# Patient Record
Sex: Male | Born: 1935
Health system: Southern US, Community
[De-identification: ages and names within clinical notes are randomized; demographics above are authoritative.]

## PROBLEM LIST (undated history)

## (undated) DIAGNOSIS — J189 Pneumonia, unspecified organism: Secondary | ICD-10-CM

## (undated) DIAGNOSIS — E8809 Other disorders of plasma-protein metabolism, not elsewhere classified: Secondary | ICD-10-CM

## (undated) DIAGNOSIS — I10 Essential (primary) hypertension: Secondary | ICD-10-CM

## (undated) DIAGNOSIS — R809 Proteinuria, unspecified: Secondary | ICD-10-CM

## (undated) DIAGNOSIS — R270 Ataxia, unspecified: Secondary | ICD-10-CM

## (undated) DIAGNOSIS — N184 Chronic kidney disease, stage 4 (severe): Secondary | ICD-10-CM

## (undated) DIAGNOSIS — I5032 Chronic diastolic (congestive) heart failure: Secondary | ICD-10-CM

## (undated) DIAGNOSIS — M1990 Unspecified osteoarthritis, unspecified site: Secondary | ICD-10-CM

## (undated) DIAGNOSIS — E119 Type 2 diabetes mellitus without complications: Secondary | ICD-10-CM

## (undated) DIAGNOSIS — E785 Hyperlipidemia, unspecified: Secondary | ICD-10-CM

## (undated) DIAGNOSIS — I739 Peripheral vascular disease, unspecified: Secondary | ICD-10-CM

## (undated) DIAGNOSIS — N186 End stage renal disease: Secondary | ICD-10-CM

## (undated) DIAGNOSIS — R609 Edema, unspecified: Secondary | ICD-10-CM

## (undated) DIAGNOSIS — R42 Dizziness and giddiness: Secondary | ICD-10-CM

## (undated) DIAGNOSIS — I2510 Atherosclerotic heart disease of native coronary artery without angina pectoris: Secondary | ICD-10-CM

## (undated) DIAGNOSIS — C61 Malignant neoplasm of prostate: Secondary | ICD-10-CM

## (undated) HISTORY — PX: CARDIAC CATHETERIZATION: SHX172

## (undated) HISTORY — DX: Dizziness and giddiness: R42

## (undated) HISTORY — PX: COLONOSCOPY: SHX174

## (undated) HISTORY — DX: Atherosclerotic heart disease of native coronary artery without angina pectoris: I25.10

## (undated) HISTORY — PX: INSERTION PROSTATE RADIATION SEED: SUR718

## (undated) HISTORY — DX: Ataxia, unspecified: R27.0

## (undated) HISTORY — DX: Peripheral vascular disease, unspecified: I73.9

## (undated) HISTORY — DX: Hyperlipidemia, unspecified: E78.5

## (undated) HISTORY — DX: Essential (primary) hypertension: I10

## (undated) HISTORY — PX: CATARACT EXTRACTION W/ INTRAOCULAR LENS  IMPLANT, BILATERAL: SHX1307

---

## 1985-03-06 HISTORY — PX: CORONARY ARTERY BYPASS GRAFT: SHX141

## 1998-01-26 ENCOUNTER — Encounter: Admission: RE | Admit: 1998-01-26 | Discharge: 1998-02-17 | Payer: Self-pay | Admitting: Anesthesiology

## 1998-07-27 ENCOUNTER — Encounter: Payer: Self-pay | Admitting: Emergency Medicine

## 1998-07-27 ENCOUNTER — Emergency Department (HOSPITAL_COMMUNITY): Admission: EM | Admit: 1998-07-27 | Discharge: 1998-07-27 | Payer: Self-pay | Admitting: Emergency Medicine

## 1999-01-25 ENCOUNTER — Encounter: Payer: Self-pay | Admitting: Family Medicine

## 1999-01-25 ENCOUNTER — Encounter: Admission: RE | Admit: 1999-01-25 | Discharge: 1999-01-25 | Payer: Self-pay | Admitting: Family Medicine

## 2000-03-27 ENCOUNTER — Encounter: Admission: RE | Admit: 2000-03-27 | Discharge: 2000-04-23 | Payer: Self-pay | Admitting: Orthopedic Surgery

## 2000-06-26 ENCOUNTER — Encounter: Admission: RE | Admit: 2000-06-26 | Discharge: 2000-06-28 | Payer: Self-pay | Admitting: Orthopedic Surgery

## 2001-10-18 ENCOUNTER — Encounter: Admission: RE | Admit: 2001-10-18 | Discharge: 2001-10-18 | Payer: Self-pay | Admitting: Urology

## 2001-10-18 ENCOUNTER — Encounter: Payer: Self-pay | Admitting: Urology

## 2001-10-21 ENCOUNTER — Encounter (INDEPENDENT_AMBULATORY_CARE_PROVIDER_SITE_OTHER): Payer: Self-pay | Admitting: Specialist

## 2001-10-21 ENCOUNTER — Ambulatory Visit (HOSPITAL_BASED_OUTPATIENT_CLINIC_OR_DEPARTMENT_OTHER): Admission: RE | Admit: 2001-10-21 | Discharge: 2001-10-21 | Payer: Self-pay | Admitting: Urology

## 2002-11-27 ENCOUNTER — Encounter: Admission: RE | Admit: 2002-11-27 | Discharge: 2002-11-27 | Payer: Self-pay | Admitting: Urology

## 2002-11-27 ENCOUNTER — Encounter: Payer: Self-pay | Admitting: Urology

## 2002-12-01 ENCOUNTER — Ambulatory Visit (HOSPITAL_BASED_OUTPATIENT_CLINIC_OR_DEPARTMENT_OTHER): Admission: RE | Admit: 2002-12-01 | Discharge: 2002-12-01 | Payer: Self-pay | Admitting: Urology

## 2002-12-01 ENCOUNTER — Ambulatory Visit (HOSPITAL_COMMUNITY): Admission: RE | Admit: 2002-12-01 | Discharge: 2002-12-01 | Payer: Self-pay | Admitting: Urology

## 2002-12-01 ENCOUNTER — Encounter (INDEPENDENT_AMBULATORY_CARE_PROVIDER_SITE_OTHER): Payer: Self-pay

## 2003-08-10 ENCOUNTER — Ambulatory Visit (HOSPITAL_COMMUNITY): Admission: RE | Admit: 2003-08-10 | Discharge: 2003-08-10 | Payer: Self-pay | Admitting: Cardiology

## 2004-12-20 ENCOUNTER — Encounter: Admission: RE | Admit: 2004-12-20 | Discharge: 2004-12-20 | Payer: Self-pay | Admitting: Endocrinology

## 2006-08-13 ENCOUNTER — Encounter: Admission: RE | Admit: 2006-08-13 | Discharge: 2006-08-13 | Payer: Self-pay | Admitting: Endocrinology

## 2006-09-07 ENCOUNTER — Encounter (INDEPENDENT_AMBULATORY_CARE_PROVIDER_SITE_OTHER): Payer: Self-pay | Admitting: *Deleted

## 2006-09-07 ENCOUNTER — Ambulatory Visit (HOSPITAL_COMMUNITY): Admission: RE | Admit: 2006-09-07 | Discharge: 2006-09-07 | Payer: Self-pay | Admitting: *Deleted

## 2006-09-12 HISTORY — PX: NM MYOCAR PERF WALL MOTION: HXRAD629

## 2007-04-03 ENCOUNTER — Encounter: Admission: RE | Admit: 2007-04-03 | Discharge: 2007-04-03 | Payer: Self-pay | Admitting: Urology

## 2007-04-05 ENCOUNTER — Encounter (INDEPENDENT_AMBULATORY_CARE_PROVIDER_SITE_OTHER): Payer: Self-pay | Admitting: Urology

## 2007-04-05 ENCOUNTER — Ambulatory Visit (HOSPITAL_BASED_OUTPATIENT_CLINIC_OR_DEPARTMENT_OTHER): Admission: RE | Admit: 2007-04-05 | Discharge: 2007-04-05 | Payer: Self-pay | Admitting: Urology

## 2008-08-25 ENCOUNTER — Ambulatory Visit (HOSPITAL_COMMUNITY): Admission: RE | Admit: 2008-08-25 | Discharge: 2008-08-25 | Payer: Self-pay | Admitting: *Deleted

## 2008-08-25 ENCOUNTER — Encounter (INDEPENDENT_AMBULATORY_CARE_PROVIDER_SITE_OTHER): Payer: Self-pay | Admitting: *Deleted

## 2008-11-04 HISTORY — PX: US ECHOCARDIOGRAPHY: HXRAD669

## 2009-03-05 ENCOUNTER — Ambulatory Visit (HOSPITAL_BASED_OUTPATIENT_CLINIC_OR_DEPARTMENT_OTHER): Admission: RE | Admit: 2009-03-05 | Discharge: 2009-03-05 | Payer: Self-pay | Admitting: Urology

## 2009-03-19 ENCOUNTER — Ambulatory Visit: Admission: RE | Admit: 2009-03-19 | Discharge: 2009-06-17 | Payer: Self-pay | Admitting: Radiation Oncology

## 2009-05-07 ENCOUNTER — Ambulatory Visit (HOSPITAL_BASED_OUTPATIENT_CLINIC_OR_DEPARTMENT_OTHER): Admission: RE | Admit: 2009-05-07 | Discharge: 2009-05-07 | Payer: Self-pay | Admitting: Urology

## 2010-05-14 LAB — POCT I-STAT 4, (NA,K, GLUC, HGB,HCT)
Glucose, Bld: 138 mg/dL — ABNORMAL HIGH (ref 70–99)
HCT: 48 % (ref 39.0–52.0)
Hemoglobin: 16.3 g/dL (ref 13.0–17.0)
Potassium: 3.8 mEq/L (ref 3.5–5.1)
Sodium: 138 mEq/L (ref 135–145)

## 2010-05-14 LAB — GLUCOSE, CAPILLARY: Glucose-Capillary: 126 mg/dL — ABNORMAL HIGH (ref 70–99)

## 2010-05-20 LAB — COMPREHENSIVE METABOLIC PANEL
ALT: 24 U/L (ref 0–53)
AST: 33 U/L (ref 0–37)
Albumin: 3.7 g/dL (ref 3.5–5.2)
Alkaline Phosphatase: 42 U/L (ref 39–117)
BUN: 14 mg/dL (ref 6–23)
CO2: 28 mEq/L (ref 19–32)
Calcium: 8.8 mg/dL (ref 8.4–10.5)
Chloride: 105 mEq/L (ref 96–112)
Creatinine, Ser: 1.53 mg/dL — ABNORMAL HIGH (ref 0.4–1.5)
GFR calc Af Amer: 54 mL/min — ABNORMAL LOW (ref >60)
GFR calc non Af Amer: 45 mL/min — ABNORMAL LOW (ref >60)
Glucose, Bld: 144 mg/dL — ABNORMAL HIGH (ref 70–99)
Potassium: 3.9 mEq/L (ref 3.5–5.1)
Sodium: 139 mEq/L (ref 135–145)
Total Bilirubin: 0.7 mg/dL (ref 0.3–1.2)
Total Protein: 6 g/dL (ref 6.0–8.3)

## 2010-05-20 LAB — APTT: aPTT: 26 seconds (ref 24–37)

## 2010-05-20 LAB — CBC
HCT: 43.3 % (ref 39.0–52.0)
Hemoglobin: 14.6 g/dL (ref 13.0–17.0)
MCHC: 33.7 g/dL (ref 30.0–36.0)
MCV: 95.4 fL (ref 78.0–100.0)
Platelets: 137 10*3/uL — ABNORMAL LOW (ref 150–400)
RBC: 4.54 MIL/uL (ref 4.22–5.81)
RDW: 12.9 % (ref 11.5–15.5)
WBC: 5.7 10*3/uL (ref 4.0–10.5)

## 2010-05-20 LAB — GLUCOSE, CAPILLARY
Glucose-Capillary: 131 mg/dL — ABNORMAL HIGH (ref 70–99)
Glucose-Capillary: 144 mg/dL — ABNORMAL HIGH (ref 70–99)

## 2010-05-20 LAB — PROTIME-INR
INR: 0.99 (ref 0.00–1.49)
Prothrombin Time: 13 seconds (ref 11.6–15.2)

## 2010-06-06 LAB — GLUCOSE, CAPILLARY: Glucose-Capillary: 163 mg/dL — ABNORMAL HIGH (ref 70–99)

## 2010-07-12 NOTE — Op Note (Signed)
NAMEKEITH, STARN               ACCOUNT NO.:  [id number]   MEDICAL RECORD NO.:  [id number]          PATIENT TYPE:  AMB   LOCATION:  ENDO                         FACILITY:  [facility]   PHYSICIAN:  Waverly Ferrari, M.D.    DATE OF BIRTH:  [birth year]   DATE OF PROCEDURE:  09/07/2006  DATE OF DISCHARGE:                               OPERATIVE REPORT   PROCEDURE:  Colonoscopy.   INDICATIONS:  Colon polyps, colon cancer screening.   ANESTHESIA:  Fentanyl 75 mcg, Versed 8.5 mg.   PROCEDURE IN DETAIL:  With the patient mildly sedated in the left  lateral decubitus position, a rectal examination was attempted.  Subsequently, the Pentax videoscopic colonoscope was inserted in the  rectum and passed under direct vision with pressure applied to reach the  cecum identified by the ileocecal valve and appendiceal orifice, both of  which were photographed. From this point, the colonoscope was slowly  withdrawn taking circumferential views of the colonic mucosa as we  withdrew to the rectum stopping at one fold removed from the ileocecal  valve at which point a small polyp was seen and it was noted by the fact  there was of a different color than the background melanosis coli that  was extensive. This polyp was removed using hot biopsy forceps  technique, setting of 20/150 blended current.  We next stopped at the  splenic flexure where a second polyp was seen, about the same size  approximately 4-5 mm, and it too was removed using hot biopsy forceps  technique at the same setting.  There was good hemostasis with both  removals. The colonoscope was then withdrawn all the way to the rectum  which appeared normal other than changes of melanosis coli direct view  and on retroflexed view appeared unremarkable.  The endoscope was  straightened and withdrawn.  The patient's vital signs and pulse  oximeter remained stable.  The patient tolerated the procedure well  without apparent complications.   FINDINGS:  Diffuse melanosis coli, two polyps, one at the cecum and one  at the splenic flexure area.   PLAN:  Await biopsy report.  The patient will call me for results and  follow-up with me as needed as an outpatient.           ______________________________  Waverly Ferrari, M.D.     GMO/MEDQ  D:  09/07/2006  T:  09/08/2006  Job:  [id number]

## 2010-07-12 NOTE — Op Note (Signed)
[name], [name]               ACCOUNT NO.:  [id number]   MEDICAL RECORD NO.:  [id number]          PATIENT TYPE:  AMB   LOCATION:  NESC                         FACILITY:  [facility]   PHYSICIAN:  Mark C. Karsten Ro, M.D.  DATE OF BIRTH:  1934/01/23   DATE OF PROCEDURE:  04/05/2007  DATE OF DISCHARGE:                               OPERATIVE REPORT   PREOPERATIVE DIAGNOSIS:  1. Elevated prostate specific antigen.  2. Rectal stricture.   POSTOPERATIVE DIAGNOSIS:  1. Elevated prostate specific antigen.  2. Rectal stricture.   PROCEDURE:  1. Examination under anesthesia.  2. Transrectal ultrasound and biopsy of the prostate.   SURGEON:  Mark C. Karsten Ro, M.D.   ANESTHESIA:  MAC.   SPECIMENS:  Prostate core biopsies to pathology.   BLOOD LOSS:  None.   COMPLICATIONS:  None.   INDICATIONS:  The patient is a 77 year old black male who was seen  initially for an elevated PSA of 5.69 back in September of 2008.  He has  a history of elevated PSA in the past and a prior transrectal ultrasound  and biopsy which revealed BPH only.  His most recent PSA was found to be  7.13 with a free to total ratio of 6.3%.  I have discussed with the  patient the need for transrectal ultrasound and biopsy of his prostate.  However, he has a severe rectal stricture and this causes him severe  discomfort even with rectal examination.  He therefore has elected to  proceed with transrectal ultrasound and biopsy of his prostate under  anesthesia.  I went over the risks, complications and alternatives.  He  understands and elects to proceed.   DESCRIPTION OF OPERATION:  After informed consent, the patient was  brought to the major OR and placed in the lateral decubitus position and  administered intravenous sedation under monitored anesthetic care.  I  then performed a digital rectal examination and noted the rectal  stricture although it did allow my index finger easy access into the  rectum and the  prostate was palpated.  It was noted to be 1+ enlarged  but smooth and symmetric and noted to be free of any nodularity,  fluctuance or masses.   The 7 MHz transrectal ultrasound probe was then introduced into the  rectum and real time ultrasonography of the prostate was performed.  Scanning initially from base to apex in the axial plane noted the  seminal vesicles appear symmetric and uninvolved with disease process.  The prostate itself appeared to have an intact capsule throughout.  There was central calcification in the transition zone on both sides  seen mainly in the base and on the right extending into the mid prostate  region.  There were no hypoechoic lesions noted and sagittal scanning  revealed no additional findings.   Transrectal ultrasound guidance was then used to perform sextant  biopsies.  Two cores were obtained from the right and left posterior,  mid and apical regions of the prostate for a total of 12 cores.  I then  removed the transrectal ultrasound probe and noted no bleeding.  The  patient was awakened and taken to the recovery room in stable  satisfactory condition.  He tolerated the procedure well with no  intraoperative complications.  He did receive preoperative oral  antibiotics with Cipro 500 mg as well as 80 mg of gentamicin and 1.5  grams of Unasyn prior to the procedure.   I will contact the patient with the results of his biopsy and plan to  see him back in 6 months for followup DRE and PSA as long as the biopsy  is negative.      Mark C. Karsten Ro, M.D.  Electronically Signed     MCO/MEDQ  D:  04/05/2007  T:  04/06/2007  Job:  [id number]

## 2010-07-12 NOTE — Op Note (Signed)
NAMERUSS, GASTER               ACCOUNT NO.:  [id number]   MEDICAL RECORD NO.:  [id number]          PATIENT TYPE:  AMB   LOCATION:  ENDO                         FACILITY:  [facility]   PHYSICIAN:  Waverly Ferrari, M.D.    DATE OF BIRTH:  09-29-1933   DATE OF PROCEDURE:  DATE OF DISCHARGE:                               OPERATIVE REPORT   PROCEDURE:  Colonoscopy with polypectomy and biopsy.   INDICATIONS:  Colon polyps.   ANESTHESIA:  Fentanyl 100 mcg and Versed 7 mg.   PROCEDURE:  With the patient mildly sedated in the left lateral  decubitus position, the Pentax videoscopic colonoscope was inserted in  the rectum and passed under direct vision to the cecum identified by  crow's foot of the cecum and ileocecal valve, both of which were  photographed.  Of note, the patient had extensive melanosis coli  throughout the colon.  From this point, the colonoscope was slowly  withdrawn, taking circumferential views of the colonic mucosa and  stopping in the splenic flexure and descending colon area where three  polyps were seen, two were photographed, but all three were removed.  On  the first, the snare went through without cautery, except for cutting,  but bleeding stopped without any further manipulations.  The second  polyp was removed using hot biopsy forceps technique and the third with  snare cautery technique with setting of 20/150 blended current.  All  tissue was retrieved for pathology.  From this point, the colonoscope  was then further withdrawn all the way to the rectum which appeared  normal on direct and retroflexed view.  The endoscope was straightened  and withdrawn.  The patient's vital signs and pulse oximeter remained  stable.  The patient tolerated the procedure well without apparent  complication.   FINDINGS:  Extensive melanosis coli with polyps in the splenic flexure  area and descending colon as noted.   PLAN:  Await biopsy report.  The patient will call me for  results and  followup with me as needed as an outpatient.           ______________________________  Waverly Ferrari, M.D.     GMO/MEDQ  D:  08/25/2008  T:  08/25/2008  Job:  [id number]

## 2010-07-15 NOTE — Op Note (Signed)
[name], [name]                           ACCOUNT NO.:  [id number]   MEDICAL RECORD NO.:  [id number]                   PATIENT TYPE:  AMB   LOCATION:  NESC                                 FACILITY:  [facility]   PHYSICIAN:  Mark C. Karsten Ro, M.D.               DATE OF BIRTH:  12-16-1933   DATE OF PROCEDURE:  12/01/2002  DATE OF DISCHARGE:                                 OPERATIVE REPORT   PREOPERATIVE DIAGNOSES:  1. Elevated PSA.  2. Benign prostatic hypertrophy.  3. Rectal stricture.   POSTOPERATIVE DIAGNOSES:  1. Elevated PSA.  2. Benign prostatic hypertrophy.  3. Rectal stricture.  4. Prostatic calcification.  5. Intraprostatic cyst.   PROCEDURES:  Examination under anesthesia with transrectal ultrasound and  guided biopsy of the prostate.   SURGEON:  Mark C. Karsten Ro, M.D.   ANESTHESIA:  General.   BLOOD LOSS:  Less than 1 mL.   SPECIMENS:  Prostate biopsy cores, six from right lobe and six from the left  lobe.   DRAINS:  None.   COMPLICATIONS:  None.   INDICATIONS:  The patient is a 77 year old black male with known rectal  stricture.  He has had a PSA elevation of 4.4 a year ago.  At that time, a  biopsy was performed that revealed BPH only.  In June 2004, his PSA was  4.86, but his ratio was 6%, and now his PSA has gone to 5.1.  He is unable  to tolerate a rectal examination and/or the transrectal ultrasound and  biopsy of his prostate without anesthesia.  We therefore have discussed  performing this under anesthesia, and he understands the risks,  complications, alternatives, and limitations.   DESCRIPTION OF OPERATION:  After informed consent, the patient brought to  the major OR, placed on the table, administered general anesthesia, then  moved in a lateral position.  Transrectal ultrasound probe was then placed  in the rectum.  The 7 MHz probe was utilized to visualize the prostate in  first the axial plane and then sagittal plane.  I note scanning from  base to  apex in the axial plane, the seminal vesicals are symmetric and uninvolved  in the disease process, maintaining a good prostatoseminal vesical angle.  I  again see hypertrophy at the transition zone, consistent with BPH as well as  some calcification within the prostate.  In the apex, over on the left-hand  side is a small anechoic area consistent with an intraprostatic cyst of no  significance.  The capsule appears intact throughout.  The prostate volume  measured 25.3 mL.  No additional findings were noted sagittally.   Examination under anesthesia was performed.  I note a mild to moderate  rectal stricture.  No rectal masses were noted.  The prostate is noted to be  1+ enlarged and smooth.  It did seem just slightly firmer over on the right-  hand  side relative to the left but otherwise, no suspicious nodularity or  induration or seminal vesical abnormality was palpable.   Utilizing the 7 MHz transrectal ultrasound probe, transrectal ultrasound  guided biopsies were taken from the prostate.  Six cores were obtained from  each lobe of the prostate without immediate complication.  No significant  bleeding was noted upon removal of the transrectal ultrasound probe.  The  patient was therefore awakened and taken to the recovery room in stable and  satisfactory condition.  He tolerated the procedure well.  He had stopped  his aspirin.  He had undergone routine enema and antibiotic prep.  He will  remain on antibiotics for 48 hours further and will be contacted with his  biopsy results.                                               Mark C. Karsten Ro, M.D.    MCO/MEDQ  D:  12/01/2002  T:  12/01/2002  Job:  [id number]   cc:   Ronaldo Miyamoto, M.D.  [address]  [place] [address]  Fax: [phone]   Osvaldo Human, M.D.  [address]  [place] [address]  Fax: [phone]

## 2010-07-15 NOTE — Consult Note (Signed)
[facility]  Patient:    [name], [name]                        MRN: [id number] Proc. Date: 03/27/00 Adm. Date:  [id number] Attending:  Newt Minion Dictator:   Hyman Bible, M.D.                          Consultation Report  REFERRING PHYSICIAN:  Dr. Lindwood Qua.  HISTORY OF PRESENT ILLNESS:  The patient is a 77 year old black male with a history of diabetes and hypertension who was referred for routine diabetic foot care and evaluation of tingling and numbness in his left foot greater than his right foot.  Patient states he has had the pain in his left foot since his CABG in ______.  He does have a history of diabetic neuropathy. The pain does not limit his activities; in fact, he walks several miles every day without any difficulty.  He denies any history of neuropathic ulcers or foot trauma.  PAST MEDICAL HISTORY:  Diabetes, hypertension, hypercholesterolemia, coronary artery disease, status post CABG in ______.  PAST SURGICAL HISTORY:  CABG, as mentioned above, and right knee arthroplasty in 1994.  FAMILY HISTORY:  Noncontributory.  ALLERGIES:  No known drug allergies.  MEDICATIONS 1. Prinivil 5 mg p.o. q.d. 2. Avandia 4 mg p.o. q.d. 3. Glucotrol XL 10 mg p.o. q.d. 4. Darvocet-N 100 one tab p.o. q.6h. p.r.n. 5. Enteric-coated aspirin one tab p.o. q.d. 6. Pravachol 40 mg p.o. q.d. 7. Maxzide one tab p.o. q.d. 8. Multivitamin.  PHYSICAL EXAMINATION  BILATERAL FEET:  Vascular:  DP pulses were trace bilaterally.  Skin:  He had no edema, no rashes, no ulcerations, bilaterally.  He did have a callus on the left fifth digit and first metatarsal head that were pared and had no underlying ulceration.  He had decreased hair growth on bilateral lower extremities.  NEUROLOGIC:  Sensation to 10 g monofilament was decreased bilaterally.  Muscle strength in dorsiflexion and plantarflexion was 5/5, bilaterally.  ASSESSMENT 1. Diabetic and  peripheral neuropathy. 2. Calluses, bilateral feet -- pared. 3. Hypertrophic toenails bilaterally -- trimmed.  PLAN 1. Fit for custom shoes and inserts. 2. Diabetic foot teaching -- patient should inspect feet every day. 3. Bag Balm to bilateral feet every day. 4. Return to clinic in three months. DD:  03/28/00 TD:  03/28/00 Job: [id number]

## 2010-07-15 NOTE — Op Note (Signed)
TNAMERODRIGO, PEZZULLO                          ACCOUNT NO.:  [id number]   MEDICAL RECORD NO.:  [id number]                   PATIENT TYPE:  AMB   LOCATION:  NESC                                 FACILITY:  [facility]   PHYSICIAN:  Mark C. Karsten Ro, M.D.               DATE OF BIRTH:  [birth year]   DATE OF PROCEDURE:  10/21/2001  DATE OF DISCHARGE:                                 OPERATIVE REPORT   PREOPERATIVE DIAGNOSIS:  Elevated PSA with low free-to-total ratio.   POSTOPERATIVE DIAGNOSES:  1. Elevated PSA with low free-to-total ratio.  2. Benign prostatic hypertrophy.  3. Prostate calcification.   PROCEDURE:  Exam under anesthesia and transrectal ultrasound with biopsy of  the prostate.   SURGEON:  Mark C. Karsten Ro, M.D.   ANESTHESIA:  General.   ESTIMATED BLOOD LOSS:  Less than 5 cc.   DRAINS:  None.   SPECIMENS:  Five cores from each both right and left lobes of the prostate  to pathology.   COMPLICATIONS:  None.   INDICATIONS:  The patient is a 77 year old black male, who was seen for a  PSA that had risen from 1.9 in 2000 to 2.4 in 2001, then to 3.6 in 2002, and  finally this month has risen to 4.4 with a free-to-total ratio of only 2%.  The patient told me that he would not be able to perform a rectal  examination on him and attempting to do so in the office confirmed that to  be the case.  He had a mild rectal stricture and was extremely sensitive and  would not tolerate that in the office.  He therefore was brought to the OR  for this procedure.   DESCRIPTION OF OPERATION:  After informed consent, the patient brought to  the major OR, placed on table, administered general anesthesia.  He had  undergone enema and antibiotic prep and once fully anesthetized, was moved  onto his left side with an axillary roll in place.  I then was able to  perform a digital rectal exam, finding slight prostate enlargement but no  fixation of the gland, firm nodularity, raised areas,  induration, or seminal  vesical abnormality.  No rectal masses were noted.   I then used the transrectal ultrasound.  A 7 MHz probe was used, and  scanning was performed in both the sagittal and axial planes, scanning from  base to apex.  First in the axial plane, I note seminal vesicals appear  symmetric and uninvolved with disease process.  The base of the prostate  revealed some hypertrophy at the transition zone with some calcification of  the border between transition and peripheral zones.  This is seen to extend  through the mid prostate.  The calcification was then noted to taper off in  the apex.  There were no hyper or hypoechoic peripheral zone lesions  otherwise noted.  The capsule appeared intact throughout.  I then scanned  sagittally and noted no additional findings.  Prostate volume was measured,  and it is on the hard copy of the ultrasound.   I then used the ultrasound to perform ultrasound-guided biopsies of the  prostate, using a 14 gauge Biopty needle.  Five cores were obtained from  each lobe of the prostate with no immediate complication noted.  The  ultrasound probe was then removed, and the patient was awakened and taken to  recovery room in stable and satisfactory condition.  He tolerated the  procedure well with no intraoperative complications.  He will be given a  prescription for #6 Vicodin for pain and #6 Cipro 500 mg to be taken b.i.d.  I will contact him with the result of the biopsy and otherwise plan to see  him back in four months for recheck if that is negative.                                               Mark C. Karsten Ro, M.D.    MCO/MEDQ  D:  10/21/2001  T:  10/22/2001  Job:  [id number]

## 2010-07-15 NOTE — Cardiovascular Report (Signed)
NAMECOSIMO, DOCTOR NO.:  [id number]   MEDICAL RECORD NO.:  [id number]                   PATIENT TYPE:  OIB   LOCATION:  2856                                 FACILITY:  [facility]   PHYSICIAN:  Bryson Dames, M.D.             DATE OF BIRTH:  September 21, 1933   DATE OF PROCEDURE:  08/10/2003  DATE OF DISCHARGE:  08/10/2003                              CARDIAC CATHETERIZATION   PROCEDURES PERFORMED:  1. Selective coronary angiography by Judkins technique.  2. Retrograde left heart catheterization.  3. Left ventricular angiography.  4. Selective visualization of the left internal mammary artery graft.  5. Selective visualization of a sequential saphenous vein graft to obtuse     marginal branch of circumflex and diagonal branch of left anterior     descending.   ENTRY SITE:  Right femoral.   DYE USED:  Omnipaque.   PATIENT PROFILE:  The patient is a 77 year old African-American diabetic  gentleman who underwent coronary artery bypass grafting in 1987 by Dr.  __________.  He has recently been having symptoms of shortness of breath and  chest pressure.  Since his grafts are now 75 years old, we opted to defer on  a Cardiolite stress test and proceed with outpatient cardiac  catheterization.  We continued his usual medications prior to entry.   RESULTS:  Left ventricular pressure is 0000000, end diastolic pressure 7,  central aortic pressure 145/65, mean of 100.  No aortic valve gradient by  pullback technique.   ANGIOGRAPHIC RESULTS:  The left anterior descending coronary artery is 80%  stenosed in the proximal portion of the vessel.  The distal vessel receives  antegrade flow from the native LAD and flow from a patent internal mammary.  The distal LAD is normal.   The diagonal branch of the LAD arises beyond the 80% stenosis in the native  LAD and the saphenous vein graft to that diagonal is patent.  See  description below.  The circumflex coronary  artery contains an 80% proximal  stenosis and an 80% mid stenosis and the distal vessel appears free of any  significant high grade stenoses and is supplied antegrade by a patent vein  graft.   The right coronary artery contains proximal calcifications over a segment of  about 10-15 mm in length, but no more than 30% obstructive lesion seen here.  This is a dominant vessel that is normal below the proximal area of luminal  irregularity and calcification.   The saphenous vein graft, which is sequential to OM and to diagonal is  widely patent and the distal runoff in both of those respective vessels is  well preserved.   Internal mammary artery graft to mid LAD is widely patent throughout and  there is good caliber in the distal vessel and TIMI-3 flow.   Left ventricle shows good contractility with normal ejection fraction and no  evidence of mitral regurgitation.  FINAL DIAGNOSES:  1. Status post coronary artery bypass graft 1987.  2. Chest pressure and shortness of breath.  3. Native coronary artery disease of left anterior descending, circumflex     and trivial disease in the right.  4. Patent left internal mammary artery graft to left anterior descending and     patent sequential vein graft to obtuse marginal-1 and circumflex obtuse     marginal branch.  5. Normal left ventricular systolic function.   PLAN:  Reassurance of patient and if pain continues consider other sources  of pain.                                               Bryson Dames, M.D.    WHG/MEDQ  D:  08/10/2003  T:  08/11/2003  Job:  [id number]   cc:   Osvaldo Human, M.D.  [address] [address]  Fax: [phone]   Bryson Dames, M.D.  [phone] [address]  [place]  [place] [address]  Fax: Price

## 2010-08-21 ENCOUNTER — Emergency Department (HOSPITAL_COMMUNITY)
Admission: EM | Admit: 2010-08-21 | Discharge: 2010-08-21 | Disposition: A | Discharge status: Home / Self Care | Payer: 59 | Attending: Emergency Medicine | Admitting: Emergency Medicine

## 2010-08-21 DIAGNOSIS — E7800 Pure hypercholesterolemia, unspecified: Secondary | ICD-10-CM | POA: Insufficient documentation

## 2010-08-21 DIAGNOSIS — E119 Type 2 diabetes mellitus without complications: Secondary | ICD-10-CM | POA: Insufficient documentation

## 2010-08-21 DIAGNOSIS — C61 Malignant neoplasm of prostate: Secondary | ICD-10-CM | POA: Insufficient documentation

## 2010-08-21 DIAGNOSIS — Y93H9 Activity, other involving exterior property and land maintenance, building and construction: Secondary | ICD-10-CM | POA: Insufficient documentation

## 2010-08-21 DIAGNOSIS — T63461A Toxic effect of venom of wasps, accidental (unintentional), initial encounter: Secondary | ICD-10-CM | POA: Insufficient documentation

## 2010-08-21 DIAGNOSIS — I10 Essential (primary) hypertension: Secondary | ICD-10-CM | POA: Insufficient documentation

## 2010-08-21 DIAGNOSIS — T6391XA Toxic effect of contact with unspecified venomous animal, accidental (unintentional), initial encounter: Secondary | ICD-10-CM | POA: Insufficient documentation

## 2010-11-18 LAB — BASIC METABOLIC PANEL
BUN: 17
CO2: 26
Calcium: 9
Chloride: 107
Creatinine, Ser: 1.25
GFR calc Af Amer: >60
GFR calc non Af Amer: 57 — ABNORMAL LOW
Glucose, Bld: 124 — ABNORMAL HIGH
Potassium: 4.3
Sodium: 141

## 2010-11-18 LAB — POCT HEMOGLOBIN-HEMACUE
Hemoglobin: 14.3
Operator id: 114531

## 2012-08-22 ENCOUNTER — Encounter: Payer: Self-pay | Admitting: Internal Medicine

## 2012-08-22 ENCOUNTER — Ambulatory Visit (INDEPENDENT_AMBULATORY_CARE_PROVIDER_SITE_OTHER): Payer: 59 | Admitting: Internal Medicine

## 2012-08-22 VITALS — BP 130/64 | HR 59 | Ht 72.0 in | Wt 201.9 lb

## 2012-08-22 DIAGNOSIS — Z951 Presence of aortocoronary bypass graft: Secondary | ICD-10-CM | POA: Insufficient documentation

## 2012-08-22 DIAGNOSIS — I2510 Atherosclerotic heart disease of native coronary artery without angina pectoris: Secondary | ICD-10-CM

## 2012-08-22 DIAGNOSIS — R42 Dizziness and giddiness: Secondary | ICD-10-CM

## 2012-08-22 DIAGNOSIS — E119 Type 2 diabetes mellitus without complications: Secondary | ICD-10-CM | POA: Insufficient documentation

## 2012-08-22 DIAGNOSIS — R079 Chest pain, unspecified: Secondary | ICD-10-CM

## 2012-08-22 DIAGNOSIS — I739 Peripheral vascular disease, unspecified: Secondary | ICD-10-CM | POA: Insufficient documentation

## 2012-08-22 DIAGNOSIS — I10 Essential (primary) hypertension: Secondary | ICD-10-CM | POA: Insufficient documentation

## 2012-08-22 DIAGNOSIS — E785 Hyperlipidemia, unspecified: Secondary | ICD-10-CM | POA: Insufficient documentation

## 2012-08-22 DIAGNOSIS — I25810 Atherosclerosis of coronary artery bypass graft(s) without angina pectoris: Secondary | ICD-10-CM | POA: Insufficient documentation

## 2012-08-22 NOTE — Progress Notes (Signed)
OFFICE NOTE  Chief Complaint:  Dizziness, shortness of breath and pressure with exertion  Primary Care Physician: Nathaniel Bolt, MD  HPI:  [name] is a 79 year old with a history of coronary artery disease status post CABG in 1987, hypertension, dyslipidemia, diabetes, and peripheral arterial disease which is mild. Over the past year he has done well from a cardiac standpoint. He does have a history of prostate cancer and underwent radiation seed implant for that. He has also been having problems with confusion and brain fog and his medications were adjusted, including holding his statin, which did not cause much improvement. This may be some early dementia. In addition, he has abnormal lower extremity Dopplers, which I repeated. This has actually shown mild improvement in his ABIs with ABI 1.1 on the right and ABI of 0.92 on the left. The bilateral posterior tibials were occluded and there was 50% to 69% reduction in left common iliac and about a 0 to 49% reduction in the right superficial femoral. Despite this, he has no symptoms of claudication. He also denies any chest pain. He has had some shortness of breath and this, however, seems to be related to this episode over the past several months where he reported initially he was thought stung by a bee, developed swelling on the left side of his face and then subsequently developed extreme weakness and there as concern of a rheumatoid arthritis. His symptoms have improved with a steroid burst and tapered. He is currently on prednisone as well as methotrexate. He has also had recent dizziness. He reports head and sinus congestion, which may be contributing to his symptoms. However some of the dizziness is associated with walking up a hill at the end of exercise or walking down hills particularly. Is not associated with change in position or quick head movements.  There is some associated fatigue and chest fullness with exercise and improves  with rest, reminiscent of possible angina.  PMHx:  Past Medical History  Diagnosis Date  . Coronary artery disease   . Hypertension   . Dyslipidemia   . DM (diabetes mellitus)   . Peripheral artery disease     mild    Past Surgical History  Procedure Laterality Date  . Cabg  1987    FAMHx:  No family history on file.  SOCHx:   reports that he quit smoking about 40 years ago. His smoking use included Cigarettes. He smoked 0.00 packs per day. He has never used smokeless tobacco. He reports that  drinks alcohol. He reports that he does not use illicit drugs.  ALLERGIES:  No Known Allergies  ROS: A comprehensive review of systems was negative except for: Respiratory: positive for dyspnea on exertion Cardiovascular: positive for exertional chest pressure/discomfort Neurological: positive for dizziness  HOME MEDS: Current Outpatient Prescriptions  Medication Sig Dispense Refill  . amLODipine (NORVASC) 5 MG tablet Take 5 mg by mouth daily.      Marland Kitchen atorvastatin (LIPITOR) 40 MG tablet Take 40 mg by mouth daily.      . folic acid (FOLVITE) 1 MG tablet Take 1 mg by mouth daily.      Marland Kitchen glimepiride (AMARYL) 4 MG tablet Take 4 mg by mouth as needed.      Marland Kitchen lisinopril (PRINIVIL,ZESTRIL) 40 MG tablet Take 40 mg by mouth daily.      . metFORMIN (GLUCOPHAGE) 500 MG tablet Take 1,000 mg by mouth 2 (two) times daily with a meal.      .  tamsulosin (FLOMAX) 0.4 MG CAPS Take 1 capsule by mouth daily.      Marland Kitchen torsemide (DEMADEX) 100 MG tablet Take 1 tablet by mouth as needed.       No current facility-administered medications for this visit.    LABS/IMAGING: No results found for this or any previous visit (from the past 48 hour(s)). No results found.  VITALS: BP 130/64  Pulse 59  Ht 6' (1.829 m)  Wt 201 lb 14.4 oz (91.581 kg)  BMI 27.38 kg/m2  EXAM: General appearance: alert and no distress Neck: no adenopathy, no carotid bruit, no JVD, supple, symmetrical, trachea midline and  thyroid not enlarged, symmetric, no tenderness/mass/nodules Lungs: clear to auscultation bilaterally Heart: regular rate and rhythm, S1, S2 normal, no murmur, click, rub or gallop Abdomen: soft, non-tender; bowel sounds normal; no masses,  no organomegaly Extremities: extremities normal, atraumatic, no cyanosis or edema Pulses: 2+ and symmetric Skin: Skin color, texture, turgor normal. No rashes or lesions Neurologic: Grossly normal  EKG: Sinus bradycardia at 59  ASSESSMENT: 1. Coronary artery disease status post CABG in 1987 2. Hypertension 3. Hyperlipidemia 4. Diabetes type 2 5. Mild PAD 6. Possible rheumatoid arthritis 7. Dizziness 8. Dyspnea exertion with chest pressure  PLAN: 1.   [name] is having some recent dizziness and feeling off fog around his head. At times he feels his heart beat is really strong and has some pinching sensation in his chest. Some of the symptoms seem to be associated with exertion, like walking up hills. I wonder if this is associated with ischemia, despite the fact that he is not describing clear angina. He does have some chest fullness and mild shortness of breath, which could be related to angina. Since his grafts are very old, would recommend a nuclear stress test which we discussed at our last visit 2 years ago. In addition, he has mild PAD. There are no carotid bruits, however I would like to rule out obstructive carotid disease. Will go ahead and obtain Dopplers as well.  Pixie Casino, MD, [facility] Attending Cardiologist [facility] [place] [facility] 08/22/2012, 2:12 PM

## 2012-08-22 NOTE — Patient Instructions (Signed)
Your physician has requested that you have a carotid duplex. This test is an ultrasound of the carotid arteries in your neck. It looks at blood flow through these arteries that supply the brain with blood. Allow one hour for this exam. There are no restrictions or special instructions.  Your physician has requested that you have en exercise stress myoview. For further information please visit [email or web address]. Please follow instruction sheet, as given.  Your physician recommends that you schedule a follow-up appointment in: 2-3 weeks after your tests.

## 2012-08-23 ENCOUNTER — Encounter: Payer: Self-pay | Admitting: Internal Medicine

## 2012-08-29 ENCOUNTER — Encounter (HOSPITAL_COMMUNITY): Payer: 59

## 2012-09-03 ENCOUNTER — Ambulatory Visit (HOSPITAL_COMMUNITY)
Admission: RE | Admit: 2012-09-03 | Discharge: 2012-09-03 | Disposition: A | Discharge status: Home / Self Care | Payer: Medicare Other | Source: Ambulatory Visit | Attending: Cardiovascular Disease | Admitting: Cardiovascular Disease

## 2012-09-03 ENCOUNTER — Ambulatory Visit (HOSPITAL_BASED_OUTPATIENT_CLINIC_OR_DEPARTMENT_OTHER)
Admission: RE | Admit: 2012-09-03 | Discharge: 2012-09-03 | Disposition: A | Discharge status: Home / Self Care | Payer: Medicare Other | Source: Ambulatory Visit | Attending: Cardiovascular Disease | Admitting: Cardiovascular Disease

## 2012-09-03 DIAGNOSIS — E119 Type 2 diabetes mellitus without complications: Secondary | ICD-10-CM | POA: Insufficient documentation

## 2012-09-03 DIAGNOSIS — Z951 Presence of aortocoronary bypass graft: Secondary | ICD-10-CM | POA: Insufficient documentation

## 2012-09-03 DIAGNOSIS — Z87891 Personal history of nicotine dependence: Secondary | ICD-10-CM | POA: Insufficient documentation

## 2012-09-03 DIAGNOSIS — I739 Peripheral vascular disease, unspecified: Secondary | ICD-10-CM | POA: Insufficient documentation

## 2012-09-03 DIAGNOSIS — I2510 Atherosclerotic heart disease of native coronary artery without angina pectoris: Secondary | ICD-10-CM | POA: Insufficient documentation

## 2012-09-03 DIAGNOSIS — E663 Overweight: Secondary | ICD-10-CM | POA: Insufficient documentation

## 2012-09-03 DIAGNOSIS — I10 Essential (primary) hypertension: Secondary | ICD-10-CM | POA: Insufficient documentation

## 2012-09-03 DIAGNOSIS — R079 Chest pain, unspecified: Secondary | ICD-10-CM

## 2012-09-03 DIAGNOSIS — R55 Syncope and collapse: Secondary | ICD-10-CM | POA: Insufficient documentation

## 2012-09-03 DIAGNOSIS — R0609 Other forms of dyspnea: Secondary | ICD-10-CM | POA: Insufficient documentation

## 2012-09-03 DIAGNOSIS — R0989 Other specified symptoms and signs involving the circulatory and respiratory systems: Secondary | ICD-10-CM | POA: Insufficient documentation

## 2012-09-03 DIAGNOSIS — R002 Palpitations: Secondary | ICD-10-CM | POA: Insufficient documentation

## 2012-09-03 DIAGNOSIS — R42 Dizziness and giddiness: Secondary | ICD-10-CM

## 2012-09-03 MED ORDER — TECHNETIUM TC 99M SESTAMIBI GENERIC - CARDIOLITE
10.7000 | Freq: Once | INTRAVENOUS | Status: AC | PRN
  Administered 2012-09-03: 11 via INTRAVENOUS

## 2012-09-03 MED ORDER — TECHNETIUM TC 99M SESTAMIBI GENERIC - CARDIOLITE
32.7000 | Freq: Once | INTRAVENOUS | Status: AC | PRN
  Administered 2012-09-03: 32.7 via INTRAVENOUS

## 2012-09-03 MED ORDER — REGADENOSON 0.4 MG/5ML IV SOLN: 0.4000 mg | Freq: Once | INTRAVENOUS | Status: DC

## 2012-09-03 NOTE — Procedures (Addendum)
[facility] [address] [place] [address] [id number]  Cardiology Nuclear Med Study  [name] is a 79 y.o. male     MRN : [id number]     DOB: 1933/08/01  Procedure Date: 09/03/2012  Nuclear Med Background Indication for Stress Test:  Graft Patency History:  CAD;CABG--1987 Cardiac Risk Factors: History of Smoking, Hypertension, Lipids, NIDDM, Overweight and PVD  Symptoms:  Chest Pain, Dizziness, DOE, Fatigue, Light-Headedness, Near Syncope, Palpitations and SOB   Nuclear Pre-Procedure Caffeine/Decaff Intake:  10:00pm NPO After: 8:00am   IV Site: R Hand  IV 0.9% NS with Angio Cath:  22g  Chest Size (in):  42"  IV Started by: Azucena Cecil, RN  Height: 6' (1.829 m)  Cup Size: n/a  BMI:  Body mass index is 27.25 kg/(m^2). Weight:  201 lb (91.173 kg)   Tech Comments:  N/A    Nuclear Med Study 1 or 2 day study: 1 day  Stress Test Type:  Stress  Order Authorizing Provider:  Lyman Bishop, MD   Resting Radionuclide: Technetium 78m Sestamibi  Resting Radionuclide Dose: 10.7 mCi   Stress Radionuclide:  Technetium 12m Sestamibi  Stress Radionuclide Dose: 32.7 mCi           Stress Protocol Rest HR: 63 Stress HR: 126  Rest BP: 142/91 Stress BP:196/83  Exercise Time (min): 7:33 METS: 8.50          Dose of Adenosine (mg):  n/a Dose of Lexiscan: n/a mg  Dose of Atropine (mg): n/a Dose of Dobutamine: n/a mcg/kg/min (at max HR)  Stress Test Technologist: Mellody Memos, CCT Nuclear Technologist: Imagene Riches, CNMT   Rest Procedure:  Myocardial perfusion imaging was performed at rest 45 minutes following the intravenous administration of Technetium 77m Sestamibi. Stress Procedure:  The patient performed treadmill exercise using a Bruce  Protocol for 7 minutes and 33 seconds. The patient stopped due to shortness of breath and fatigue. Patient denied any chest pain.  There were no significant ST-T wave changes.  Technetium  70m Sestamibi was injected at peak exercise and myocardial perfusion imaging was performed after a brief delay.  Transient Ischemic Dilatation (Normal <1.22):  0.88 Lung/Heart Ratio (Normal <0.45):  0.23 QGS EDV:  107 ml QGS ESV:  41 ml LV Ejection Fraction: 61%  Rest ECG: NSR - Normal EKG  Stress ECG: 1 mm horizontal ST depression in II, III, AVF, v4-v6  QPS Raw Data Images:  Normal; no motion artifact; normal heart/lung ratio. Stress Images:  Normal homogeneous uptake in all areas of the myocardium. Rest Images:  There is decreased uptake in the inferior wall. Subtraction (SDS):  No evidence of ischemia.  Impression Exercise Capacity:  Fair exercise capacity. BP Response:  Hypertensive blood pressure response. Clinical Symptoms:  No chest pain. ECG Impression:  minimal ST segment depression Comparison with Prior Nuclear Study: No significant change from previous study  Overall Impression:  Normal stress nuclear study.  LV Wall Motion:  NL LV Function; NL Wall Motion; EF 61%.  Pixie Casino, MD, [facility] Board Certified in Nuclear Cardiology Attending Cardiologist [facility] [place], MD  09/03/2012 6:14 PM

## 2012-09-03 NOTE — Progress Notes (Signed)
Carotid Duplex Completed. °[name] ° °

## 2012-09-11 ENCOUNTER — Encounter: Payer: Self-pay | Admitting: Internal Medicine

## 2012-09-11 ENCOUNTER — Ambulatory Visit (INDEPENDENT_AMBULATORY_CARE_PROVIDER_SITE_OTHER): Payer: 59 | Admitting: Internal Medicine

## 2012-09-11 VITALS — BP 150/80 | HR 64 | Ht 71.0 in | Wt 199.8 lb

## 2012-09-11 DIAGNOSIS — E119 Type 2 diabetes mellitus without complications: Secondary | ICD-10-CM

## 2012-09-11 DIAGNOSIS — I739 Peripheral vascular disease, unspecified: Secondary | ICD-10-CM

## 2012-09-11 DIAGNOSIS — I10 Essential (primary) hypertension: Secondary | ICD-10-CM

## 2012-09-11 DIAGNOSIS — E785 Hyperlipidemia, unspecified: Secondary | ICD-10-CM

## 2012-09-11 DIAGNOSIS — Z951 Presence of aortocoronary bypass graft: Secondary | ICD-10-CM

## 2012-09-11 DIAGNOSIS — R42 Dizziness and giddiness: Secondary | ICD-10-CM

## 2012-09-11 NOTE — Progress Notes (Signed)
OFFICE NOTE  Chief Complaint:  Dizziness, shortness of breath and pressure with exertion  Primary Care Physician: Dwan Bolt, MD  HPI:  [name] is a 79 year old with a history of coronary artery disease status post CABG in 1987, hypertension, dyslipidemia, diabetes, and peripheral arterial disease which is mild. Over the past year he has done well from a cardiac standpoint. He does have a history of prostate cancer and underwent radiation seed implant for that. He has also been having problems with confusion and brain fog and his medications were adjusted, including holding his statin, which did not cause much improvement. This may be some early dementia. In addition, he has abnormal lower extremity Dopplers, which I repeated. This has actually shown mild improvement in his ABIs with ABI 1.1 on the right and ABI of 0.92 on the left. The bilateral posterior tibials were occluded and there was 50% to 69% reduction in left common iliac and about a 0 to 49% reduction in the right superficial femoral. Despite this, he has no symptoms of claudication. He also denies any chest pain. He has had some shortness of breath and this, however, seems to be related to this episode over the past several months where he reported initially he was thought stung by a bee, developed swelling on the left side of his face and then subsequently developed extreme weakness and there as concern of a rheumatoid arthritis. His symptoms have improved with a steroid burst and tapered. He is currently on prednisone as well as methotrexate. He has also had recent dizziness. He reports head and sinus congestion, which may be contributing to his symptoms. However some of the dizziness is associated with walking up a hill at the end of exercise or walking down hills particularly. Is not associated with change in position or quick head movements.  There is some associated fatigue and chest fullness with exercise and improves  with rest, reminiscent of possible angina.  He underwent bilateral carotid Dopplers which showed a mild amount of plaque. A nuclear stress test performed which demonstrated no reversible ischemia and preserved ejection fraction. He still reports he has some mild dizziness and does fatigue a little at the beginning of exercise but improves when he continues to exercise.  PMHx:  Past Medical History  Diagnosis Date  . Coronary artery disease   . Hypertension   . Dyslipidemia   . DM (diabetes mellitus)   . Peripheral artery disease     mild    Past Surgical History  Procedure Laterality Date  . Cabg  1987    FAMHx:  No family history on file.  SOCHx:   reports that he quit smoking about 40 years ago. His smoking use included Cigarettes. He smoked 0.00 packs per day. He has never used smokeless tobacco. He reports that  drinks alcohol. He reports that he does not use illicit drugs.  ALLERGIES:  No Known Allergies  ROS: A comprehensive review of systems was negative except for: Respiratory: positive for dyspnea on exertion Cardiovascular: positive for exertional chest pressure/discomfort Neurological: positive for dizziness  HOME MEDS: Current Outpatient Prescriptions  Medication Sig Dispense Refill  . amLODipine (NORVASC) 5 MG tablet Take 5 mg by mouth daily.      Marland Kitchen atorvastatin (LIPITOR) 40 MG tablet Take 40 mg by mouth daily.      . folic acid (FOLVITE) 1 MG tablet Take 1 mg by mouth daily.      Marland Kitchen glimepiride (AMARYL) 4 MG tablet Take  4 mg by mouth as needed.      Marland Kitchen lisinopril (PRINIVIL,ZESTRIL) 40 MG tablet Take 40 mg by mouth daily.      . metFORMIN (GLUCOPHAGE) 500 MG tablet Take 1,000 mg by mouth 2 (two) times daily with a meal.      . tamsulosin (FLOMAX) 0.4 MG CAPS Take 1 capsule by mouth daily.      Marland Kitchen torsemide (DEMADEX) 100 MG tablet Take 1 tablet by mouth as needed.       No current facility-administered medications for this visit.    LABS/IMAGING: No results  found for this or any previous visit (from the past 48 hour(s)). No results found.  VITALS: BP 150/80  Pulse 64  Ht 5\' 11"  (1.803 m)  Wt 199 lb 12.8 oz (90.629 kg)  BMI 27.88 kg/m2  EXAM: General appearance: alert and no distress Neck: no adenopathy, no carotid bruit, no JVD, supple, symmetrical, trachea midline and thyroid not enlarged, symmetric, no tenderness/mass/nodules Lungs: clear to auscultation bilaterally Heart: regular rate and rhythm, S1, S2 normal, 2/6 systolic murmur, rub or gallop Abdomen: soft, non-tender; bowel sounds normal; no masses,  no organomegaly Extremities: extremities normal, atraumatic, no cyanosis or edema Pulses: 2+ and symmetric Skin: Skin color, texture, turgor normal. No rashes or lesions Neurologic: Grossly normal  EKG: deferred  ASSESSMENT: 1. Coronary artery disease status post CABG in 1987 2. Hypertension 3. Hyperlipidemia 4. Diabetes type 2 5. Mild PAD 6. Possible rheumatoid arthritis 7. Dizziness 8. Dyspnea exertion with chest pressure  PLAN: 1.   [name] had a negative nuclear stress test and Dopplers were to not indicate significant stenosis. Some of his dizziness may be medication related as is worse when he gets out of bed in the morning. He does have a little limitation to exercise but improves after a brief rest period and is able to exercise significantly after that. Part of this may be due to the high workload and post on a stationary bicycle. I asked him to reduce that initially and gradually try to build up on his exercise. He does have a soft systolic murmur which could be aortic sclerosis.  I would recommend he get an echocardiogram at his next visit. I can see him back annually or sooner if needed.  Pixie Casino, MD, [facility] Attending Cardiologist [facility] [place] [facility] 09/11/2012, 9:46 AM

## 2012-09-11 NOTE — Patient Instructions (Addendum)
Your physician recommends that you schedule a follow-up appointment in: 1 year  

## 2013-07-11 ENCOUNTER — Telehealth: Payer: Self-pay | Admitting: Internal Medicine

## 2013-07-17 NOTE — Telephone Encounter (Signed)
Closed encounter °

## 2013-08-18 ENCOUNTER — Other Ambulatory Visit: Payer: Self-pay | Admitting: Endocrinology

## 2013-08-18 DIAGNOSIS — N289 Disorder of kidney and ureter, unspecified: Secondary | ICD-10-CM

## 2013-08-19 ENCOUNTER — Ambulatory Visit
Admission: RE | Admit: 2013-08-19 | Discharge: 2013-08-19 | Disposition: A | Discharge status: Home / Self Care | Payer: Medicare Other | Source: Ambulatory Visit | Attending: Endocrinology | Admitting: Endocrinology

## 2013-08-19 DIAGNOSIS — N289 Disorder of kidney and ureter, unspecified: Secondary | ICD-10-CM

## 2013-09-02 ENCOUNTER — Encounter: Payer: Self-pay | Admitting: *Deleted

## 2013-09-12 ENCOUNTER — Ambulatory Visit (INDEPENDENT_AMBULATORY_CARE_PROVIDER_SITE_OTHER): Payer: Medicare Other | Admitting: Internal Medicine

## 2013-09-12 ENCOUNTER — Encounter: Payer: Self-pay | Admitting: Internal Medicine

## 2013-09-12 VITALS — BP 160/72 | HR 66 | Ht 72.0 in | Wt 182.8 lb

## 2013-09-12 DIAGNOSIS — I10 Essential (primary) hypertension: Secondary | ICD-10-CM

## 2013-09-12 DIAGNOSIS — I25810 Atherosclerosis of coronary artery bypass graft(s) without angina pectoris: Secondary | ICD-10-CM

## 2013-09-12 DIAGNOSIS — R011 Cardiac murmur, unspecified: Secondary | ICD-10-CM

## 2013-09-12 DIAGNOSIS — I739 Peripheral vascular disease, unspecified: Secondary | ICD-10-CM

## 2013-09-12 DIAGNOSIS — Z951 Presence of aortocoronary bypass graft: Secondary | ICD-10-CM

## 2013-09-12 DIAGNOSIS — E785 Hyperlipidemia, unspecified: Secondary | ICD-10-CM

## 2013-09-12 NOTE — Patient Instructions (Addendum)
Your physician has requested that you have an echocardiogram. Echocardiography is a painless test that uses sound waves to create images of your heart. It provides your doctor with information about the size and shape of your heart and how well your heart's chambers and valves are working. This procedure takes approximately one hour. There are no restrictions for this procedure.  Please have your nephrologist send Dr. Debara Pickett a copy of the office visit note (fax [phone])  Your physician wants you to follow-up in: 1 year with Dr. Debara Pickett. You will receive a reminder letter in the mail two months in advance. If you don't receive a letter, please call our office to schedule the follow-up appointment.

## 2013-09-12 NOTE — Progress Notes (Signed)
OFFICE NOTE  Chief Complaint:  Dizziness, shortness of breath and pressure with exertion  Primary Care Physician: Dwan Bolt, MD  HPI:  [name] is a 80 year old with a history of coronary artery disease status post CABG in 1987, hypertension, dyslipidemia, diabetes, and peripheral arterial disease which is mild. Over the past year he has done well from a cardiac standpoint. He does have a history of prostate cancer and underwent radiation seed implant for that. He has also been having problems with confusion and brain fog and his medications were adjusted, including holding his statin, which did not cause much improvement. This may be some early dementia. In addition, he has abnormal lower extremity Dopplers, which I repeated. This has actually shown mild improvement in his ABIs with ABI 1.1 on the right and ABI of 0.92 on the left. The bilateral posterior tibials were occluded and there was 50% to 69% reduction in left common iliac and about a 0 to 49% reduction in the right superficial femoral. Despite this, he has no symptoms of claudication. He also denies any chest pain. He has had some shortness of breath and this, however, seems to be related to this episode over the past several months where he reported initially he was thought stung by a bee, developed swelling on the left side of his face and then subsequently developed extreme weakness and there as concern of a rheumatoid arthritis. His symptoms have improved with a steroid burst and tapered. He is currently on prednisone as well as methotrexate. He has also had recent dizziness. He reports head and sinus congestion, which may be contributing to his symptoms. However some of the dizziness is associated with walking up a hill at the end of exercise or walking down hills particularly. Is not associated with change in position or quick head movements.  There is some associated fatigue and chest fullness with exercise  and improves with rest, reminiscent of possible angina.  He underwent bilateral carotid Dopplers which showed a mild amount of plaque. A nuclear stress test performed which demonstrated no reversible ischemia and preserved ejection fraction. He still reports he has some mild dizziness and does fatigue a little at the beginning of exercise but improves when he continues to exercise.  He is generally without complaints today. He reports that recently blood work indicated that his kidney function was worsening and that he is now scheduled to see a nephrologist next Monday. I do not yet have those results in front of me. His metformin was discontinued due to this but he remains on lisinopril and that may need to be adjusted. He denies any specific anginal symptoms. He has some very mild claudication symptoms but it improves with walking. He continues to have a systolic murmur best heard over the aortic area and does have a history of aortic sclerosis in the past.  PMHx:  Past Medical History  Diagnosis Date  . Coronary artery disease   . Hypertension   . Dyslipidemia   . DM (diabetes mellitus)   . Peripheral artery disease     mild    Past Surgical History  Procedure Laterality Date  . Coronary artery bypass graft  03/06/85    LAD and first diagonal/circumflex (sequential saphenous vein graft,cardioplegia  . US echocardiography  11/04/2008    trace TR & MR  . Nm myocar perf wall motion  09/12/2006    no ischemia    FAMHx:  Family History  Problem Relation Age of Onset  .  Heart attack Mother     SOCHx:   reports that he quit smoking about 41 years ago. His smoking use included Cigarettes. He smoked 0.00 packs per day. He has never used smokeless tobacco. He reports that he drinks alcohol. He reports that he does not use illicit drugs.  ALLERGIES:  No Known Allergies  ROS: A comprehensive review of systems was negative except for: Cardiovascular: positive for intermittent  claudication Neurological: positive for dizziness  HOME MEDS: Current Outpatient Prescriptions  Medication Sig Dispense Refill  . amLODipine (NORVASC) 5 MG tablet Take 5 mg by mouth daily.      Marland Kitchen atorvastatin (LIPITOR) 40 MG tablet Take 40 mg by mouth daily.      . folic acid (FOLVITE) 1 MG tablet Take 1 mg by mouth daily.      Marland Kitchen glimepiride (AMARYL) 4 MG tablet Take 4 mg by mouth as needed.      Marland Kitchen lisinopril (PRINIVIL,ZESTRIL) 40 MG tablet Take 40 mg by mouth daily.      . tamsulosin (FLOMAX) 0.4 MG CAPS Take 1 capsule by mouth daily.      Marland Kitchen torsemide (DEMADEX) 100 MG tablet Take 1 tablet by mouth as needed.       No current facility-administered medications for this visit.    LABS/IMAGING: No results found for this or any previous visit (from the past 48 hour(s)). No results found.  VITALS: BP 160/72  Pulse 66  Ht 6' (1.829 m)  Wt 182 lb 12.8 oz (82.918 kg)  BMI 24.79 kg/m2  EXAM: General appearance: alert and no distress Neck: no adenopathy, no carotid bruit, no JVD, supple, symmetrical, trachea midline and thyroid not enlarged, symmetric, no tenderness/mass/nodules Lungs: clear to auscultation bilaterally Heart: regular rate and rhythm, S1, S2 normal, 2/6 systolic murmur, rub or gallop Abdomen: soft, non-tender; bowel sounds normal; no masses,  no organomegaly Extremities: extremities normal, atraumatic, no cyanosis or edema Pulses: 2+ and symmetric Skin: Skin color, texture, turgor normal. No rashes or lesions Neurologic: Grossly normal  EKG: Normal sinus rhythm at 66  ASSESSMENT: 1. Coronary artery disease status post CABG in 1987 2. Hypertension 3. Hyperlipidemia 4. Diabetes type 2 5. Mild PAD - intermittent claudication, not lifestyle limiting 6. Possible rheumatoid arthritis 7. Dizziness 8. Murmur  PLAN: 1.   [name] is doing well with very intermittent claudication which is not lifestyle limiting. He denies any chest pain or worsening shortness of  breath. He does have a systolic murmur which may be aortic sclerosis or very mild aortic stenosis. There is a clear second heart sound. I would recommend an echocardiogram as his last echo was in 2010. Plan to see him back annually or sooner as necessary. Again he may need adjustments in his blood pressure medications if he does have worsening renal function. I have asked him to request his nephrologist is in a copy of her office visit to me.  Pixie Casino, MD, [facility] Attending Cardiologist The [place] C 09/12/2013, 8:20 AM

## 2013-09-17 ENCOUNTER — Ambulatory Visit (HOSPITAL_COMMUNITY)
Admission: RE | Admit: 2013-09-17 | Discharge: 2013-09-17 | Disposition: A | Discharge status: Home / Self Care | Payer: Medicare Other | Source: Ambulatory Visit | Attending: Internal Medicine | Admitting: Internal Medicine

## 2013-09-17 DIAGNOSIS — R011 Cardiac murmur, unspecified: Secondary | ICD-10-CM | POA: Insufficient documentation

## 2013-09-17 DIAGNOSIS — I517 Cardiomegaly: Secondary | ICD-10-CM

## 2013-09-17 NOTE — Progress Notes (Signed)
2D Echo Performed 09/17/2013    Marygrace Drought, RCS

## 2014-03-18 DIAGNOSIS — N183 Chronic kidney disease, stage 3 (moderate): Secondary | ICD-10-CM | POA: Diagnosis not present

## 2014-03-18 DIAGNOSIS — E785 Hyperlipidemia, unspecified: Secondary | ICD-10-CM | POA: Diagnosis not present

## 2014-03-18 DIAGNOSIS — I129 Hypertensive chronic kidney disease with stage 1 through stage 4 chronic kidney disease, or unspecified chronic kidney disease: Secondary | ICD-10-CM | POA: Diagnosis not present

## 2014-03-18 DIAGNOSIS — E0869 Diabetes mellitus due to underlying condition with other specified complication: Secondary | ICD-10-CM | POA: Diagnosis not present

## 2014-03-23 DIAGNOSIS — N401 Enlarged prostate with lower urinary tract symptoms: Secondary | ICD-10-CM | POA: Diagnosis not present

## 2014-03-31 DIAGNOSIS — Z8546 Personal history of malignant neoplasm of prostate: Secondary | ICD-10-CM | POA: Diagnosis not present

## 2014-03-31 DIAGNOSIS — N401 Enlarged prostate with lower urinary tract symptoms: Secondary | ICD-10-CM | POA: Diagnosis not present

## 2014-04-28 DIAGNOSIS — E119 Type 2 diabetes mellitus without complications: Secondary | ICD-10-CM | POA: Diagnosis not present

## 2014-05-20 DIAGNOSIS — I10 Essential (primary) hypertension: Secondary | ICD-10-CM | POA: Diagnosis not present

## 2014-05-20 DIAGNOSIS — E118 Type 2 diabetes mellitus with unspecified complications: Secondary | ICD-10-CM | POA: Diagnosis not present

## 2014-05-27 DIAGNOSIS — E789 Disorder of lipoprotein metabolism, unspecified: Secondary | ICD-10-CM | POA: Diagnosis not present

## 2014-05-27 DIAGNOSIS — I129 Hypertensive chronic kidney disease with stage 1 through stage 4 chronic kidney disease, or unspecified chronic kidney disease: Secondary | ICD-10-CM | POA: Diagnosis not present

## 2014-05-27 DIAGNOSIS — N183 Chronic kidney disease, stage 3 (moderate): Secondary | ICD-10-CM | POA: Diagnosis not present

## 2014-05-27 DIAGNOSIS — E0822 Diabetes mellitus due to underlying condition with diabetic chronic kidney disease: Secondary | ICD-10-CM | POA: Diagnosis not present

## 2014-05-27 DIAGNOSIS — I10 Essential (primary) hypertension: Secondary | ICD-10-CM | POA: Diagnosis not present

## 2014-07-16 DIAGNOSIS — M0589 Other rheumatoid arthritis with rheumatoid factor of multiple sites: Secondary | ICD-10-CM | POA: Diagnosis not present

## 2014-07-16 DIAGNOSIS — N189 Chronic kidney disease, unspecified: Secondary | ICD-10-CM | POA: Diagnosis not present

## 2014-07-24 DIAGNOSIS — I129 Hypertensive chronic kidney disease with stage 1 through stage 4 chronic kidney disease, or unspecified chronic kidney disease: Secondary | ICD-10-CM | POA: Diagnosis not present

## 2014-07-24 DIAGNOSIS — N183 Chronic kidney disease, stage 3 (moderate): Secondary | ICD-10-CM | POA: Diagnosis not present

## 2014-07-24 DIAGNOSIS — E0822 Diabetes mellitus due to underlying condition with diabetic chronic kidney disease: Secondary | ICD-10-CM | POA: Diagnosis not present

## 2014-08-10 DIAGNOSIS — E213 Hyperparathyroidism, unspecified: Secondary | ICD-10-CM | POA: Diagnosis not present

## 2014-08-10 DIAGNOSIS — E291 Testicular hypofunction: Secondary | ICD-10-CM | POA: Diagnosis not present

## 2014-08-10 DIAGNOSIS — Z125 Encounter for screening for malignant neoplasm of prostate: Secondary | ICD-10-CM | POA: Diagnosis not present

## 2014-08-10 DIAGNOSIS — E756 Lipid storage disorder, unspecified: Secondary | ICD-10-CM | POA: Diagnosis not present

## 2014-08-10 DIAGNOSIS — Z0000 Encounter for general adult medical examination without abnormal findings: Secondary | ICD-10-CM | POA: Diagnosis not present

## 2014-08-17 DIAGNOSIS — I739 Peripheral vascular disease, unspecified: Secondary | ICD-10-CM | POA: Diagnosis not present

## 2014-08-17 DIAGNOSIS — I2510 Atherosclerotic heart disease of native coronary artery without angina pectoris: Secondary | ICD-10-CM | POA: Diagnosis not present

## 2014-08-17 DIAGNOSIS — E118 Type 2 diabetes mellitus with unspecified complications: Secondary | ICD-10-CM | POA: Diagnosis not present

## 2014-08-17 DIAGNOSIS — E789 Disorder of lipoprotein metabolism, unspecified: Secondary | ICD-10-CM | POA: Diagnosis not present

## 2014-09-09 DIAGNOSIS — I10 Essential (primary) hypertension: Secondary | ICD-10-CM | POA: Diagnosis not present

## 2014-09-09 DIAGNOSIS — E119 Type 2 diabetes mellitus without complications: Secondary | ICD-10-CM | POA: Diagnosis not present

## 2014-09-09 DIAGNOSIS — Z86010 Personal history of colonic polyps: Secondary | ICD-10-CM | POA: Diagnosis not present

## 2014-09-18 ENCOUNTER — Ambulatory Visit (INDEPENDENT_AMBULATORY_CARE_PROVIDER_SITE_OTHER): Payer: Medicare Other | Admitting: Internal Medicine

## 2014-09-18 ENCOUNTER — Encounter: Payer: Self-pay | Admitting: Internal Medicine

## 2014-09-18 VITALS — BP 196/86 | HR 68 | Ht 71.0 in | Wt 189.8 lb

## 2014-09-18 DIAGNOSIS — N184 Chronic kidney disease, stage 4 (severe): Secondary | ICD-10-CM

## 2014-09-18 DIAGNOSIS — Z951 Presence of aortocoronary bypass graft: Secondary | ICD-10-CM

## 2014-09-18 DIAGNOSIS — R011 Cardiac murmur, unspecified: Secondary | ICD-10-CM

## 2014-09-18 DIAGNOSIS — G4710 Hypersomnia, unspecified: Secondary | ICD-10-CM | POA: Diagnosis not present

## 2014-09-18 DIAGNOSIS — N179 Acute kidney failure, unspecified: Secondary | ICD-10-CM | POA: Insufficient documentation

## 2014-09-18 DIAGNOSIS — N189 Chronic kidney disease, unspecified: Secondary | ICD-10-CM | POA: Insufficient documentation

## 2014-09-18 DIAGNOSIS — I739 Peripheral vascular disease, unspecified: Secondary | ICD-10-CM | POA: Diagnosis not present

## 2014-09-18 NOTE — Progress Notes (Signed)
OFFICE NOTE  Chief Complaint:  Occasional dizziness, elevated blood pressure  Primary Care Physician: Dwan Bolt, MD  HPI:  [name] is a 81 year old with a history of coronary artery disease status post CABG in 1987, hypertension, dyslipidemia, diabetes, and peripheral arterial disease which is mild. Over the past year he has done well from a cardiac standpoint. He does have a history of prostate cancer and underwent radiation seed implant for that. He has also been having problems with confusion and brain fog and his medications were adjusted, including holding his statin, which did not cause much improvement. This may be some early dementia. In addition, he has abnormal lower extremity Dopplers, which I repeated. This has actually shown mild improvement in his ABIs with ABI 1.1 on the right and ABI of 0.92 on the left. The bilateral posterior tibials were occluded and there was 50% to 69% reduction in left common iliac and about a 0 to 49% reduction in the right superficial femoral. Despite this, he has no symptoms of claudication. He also denies any chest pain. He has had some shortness of breath and this, however, seems to be related to this episode over the past several months where he reported initially he was thought stung by a bee, developed swelling on the left side of his face and then subsequently developed extreme weakness and there as concern of a rheumatoid arthritis. His symptoms have improved with a steroid burst and tapered. He is currently on prednisone as well as methotrexate. He has also had recent dizziness. He reports head and sinus congestion, which may be contributing to his symptoms. However some of the dizziness is associated with walking up a hill at the end of exercise or walking down hills particularly. Is not associated with change in position or quick head movements.  There is some associated fatigue and chest fullness with exercise and improves  with rest, reminiscent of possible angina.  He underwent bilateral carotid Dopplers which showed a mild amount of plaque. A nuclear stress test performed which demonstrated no reversible ischemia and preserved ejection fraction. He still reports he has some mild dizziness and does fatigue a little at the beginning of exercise but improves when he continues to exercise.  He is generally without complaints today. He reports that recently blood work indicated that his kidney function was worsening and that he is now scheduled to see a nephrologist next Monday. I do not yet have those results in front of me. His metformin was discontinued due to this but he remains on lisinopril and that may need to be adjusted. He denies any specific anginal symptoms. He has some very mild claudication symptoms but it improves with walking. He continues to have a systolic murmur best heard over the aortic area and does have a history of aortic sclerosis in the past.  [name] returns today for follow-up. Unfortunately says he hasn't taken his medicine today. He is supposed to take hydralazine 100 mg 3 times a day and for some reason has not taken 2 doses this morning. Blood pressure was very high at 196/86 but he denied any chest pain or headache. He says he going to take his medicine when he gets home. Is also on Flomax but no other blood pressure medicines. This is probably due to worsening renal function. Currently his creatinine is around 3 and is followed by Dr. Mercy Moore. Is also on Lipitor and cholesterol was recently checked by Dr. Wilson Singer.  PMHx:  Past  Medical History  Diagnosis Date  . Coronary artery disease   . Hypertension   . Dyslipidemia   . DM (diabetes mellitus)   . Peripheral artery disease     mild    Past Surgical History  Procedure Laterality Date  . Coronary artery bypass graft  03/06/85    LAD and first diagonal/circumflex (sequential saphenous vein graft,cardioplegia  . US echocardiography   11/04/2008    trace TR & MR  . Nm myocar perf wall motion  09/12/2006    no ischemia    FAMHx:  Family History  Problem Relation Age of Onset  . Heart attack Mother     SOCHx:   reports that he quit smoking about 42 years ago. His smoking use included Cigarettes. He has never used smokeless tobacco. He reports that he drinks alcohol. He reports that he does not use illicit drugs.  ALLERGIES:  No Known Allergies  ROS: A comprehensive review of systems was negative except for: Neurological: positive for dizziness  HOME MEDS: Current Outpatient Prescriptions  Medication Sig Dispense Refill  . atorvastatin (LIPITOR) 40 MG tablet Take 40 mg by mouth daily.    . folic acid (FOLVITE) 1 MG tablet Take 1 mg by mouth daily.    . furosemide (LASIX) 40 MG tablet Take 40 mg by mouth daily.    Marland Kitchen glimepiride (AMARYL) 4 MG tablet Take 4 mg by mouth daily with breakfast.     . hydrALAZINE (APRESOLINE) 100 MG tablet Take 100 mg by mouth 3 (three) times daily.    . methotrexate (RHEUMATREX) 2.5 MG tablet Take 7.5 mg by mouth. 6 times/week    . tamsulosin (FLOMAX) 0.4 MG CAPS Take 1 capsule by mouth daily.     No current facility-administered medications for this visit.    LABS/IMAGING: No results found for this or any previous visit (from the past 48 hour(s)). No results found.  VITALS: BP 196/86 mmHg  Pulse 68  Ht 5\' 11"  (1.803 m)  Wt 189 lb 12.8 oz (86.093 kg)  BMI 26.48 kg/m2  EXAM: General appearance: alert and no distress Neck: no adenopathy, no carotid bruit, no JVD, supple, symmetrical, trachea midline and thyroid not enlarged, symmetric, no tenderness/mass/nodules Lungs: clear to auscultation bilaterally Heart: regular rate and rhythm, S1, S2 normal, 2/6 systolic murmur, rub or gallop Abdomen: soft, non-tender; bowel sounds normal; no masses,  no organomegaly Extremities: extremities normal, atraumatic, no cyanosis or edema Pulses: 2+ and symmetric Skin: Skin color, texture,  turgor normal. No rashes or lesions Neurologic: Grossly normal  EKG: Normal sinus rhythm at 68, minimal voltage criteria for LVH  ASSESSMENT: 1. Coronary artery disease status post CABG in 1987 2. Hypertension 3. Hyperlipidemia 4. Diabetes type 2 5. Mild PAD - intermittent claudication, not lifestyle limiting 6. Possible rheumatoid arthritis 7. Dizziness 8. Murmur  PLAN: 1.   Mr. Degrande is doing well. He denies any chest pain or shortness of breath. His bypass grafts are now quite old coming up on 81 years old next year. He's not had stress testing since 2008 and although he is not significantly symptomatic, guidelines indicate is a reasonable strategy for surveillance stress testing given the age of his grafts and the amount of time since it was last assessed. I recommend exercise nuclear stress test. He does report some intermittent claudication which is not lifestyle limiting. Plan to see him back annually or sooner if his stress test of course is abnormal.  Pixie Casino, MD, [facility] Attending Cardiologist [facility]  Weir C Aliou Mealey 09/18/2014, 4:38 PM

## 2014-09-18 NOTE — Patient Instructions (Signed)
Your physician has requested that you have an exercise stress myoview. For further information please visit [email or web address]. Please follow instruction sheet, as given.  Your physician wants you to follow-up in: 1 year with Dr. Hilty. You will receive a reminder letter in the mail two months in advance. If you don't receive a letter, please call our office to schedule the follow-up appointment.  

## 2014-09-23 ENCOUNTER — Encounter: Payer: Self-pay | Admitting: Internal Medicine

## 2014-10-01 DIAGNOSIS — Z1211 Encounter for screening for malignant neoplasm of colon: Secondary | ICD-10-CM | POA: Diagnosis not present

## 2014-10-01 DIAGNOSIS — K635 Polyp of colon: Secondary | ICD-10-CM | POA: Diagnosis not present

## 2014-10-01 DIAGNOSIS — D124 Benign neoplasm of descending colon: Secondary | ICD-10-CM | POA: Diagnosis not present

## 2014-10-01 DIAGNOSIS — D123 Benign neoplasm of transverse colon: Secondary | ICD-10-CM | POA: Diagnosis not present

## 2014-10-01 DIAGNOSIS — Z86010 Personal history of colonic polyps: Secondary | ICD-10-CM | POA: Diagnosis not present

## 2014-10-01 DIAGNOSIS — K6389 Other specified diseases of intestine: Secondary | ICD-10-CM | POA: Diagnosis not present

## 2014-10-01 DIAGNOSIS — L818 Other specified disorders of pigmentation: Secondary | ICD-10-CM | POA: Diagnosis not present

## 2014-10-02 ENCOUNTER — Telehealth (HOSPITAL_COMMUNITY): Payer: Self-pay

## 2014-10-02 NOTE — Telephone Encounter (Signed)
Encounter complete. 

## 2014-10-06 ENCOUNTER — Telehealth (HOSPITAL_COMMUNITY): Payer: Self-pay

## 2014-10-06 NOTE — Telephone Encounter (Signed)
Encounter complete. 

## 2014-10-07 ENCOUNTER — Ambulatory Visit (HOSPITAL_COMMUNITY)
Admission: RE | Admit: 2014-10-07 | Discharge: 2014-10-07 | Disposition: A | Discharge status: Home / Self Care | Payer: Medicare Other | Source: Ambulatory Visit | Attending: Internal Medicine | Admitting: Internal Medicine

## 2014-10-07 DIAGNOSIS — R011 Cardiac murmur, unspecified: Secondary | ICD-10-CM | POA: Diagnosis not present

## 2014-10-07 DIAGNOSIS — Z951 Presence of aortocoronary bypass graft: Secondary | ICD-10-CM | POA: Diagnosis not present

## 2014-10-07 DIAGNOSIS — G4710 Hypersomnia, unspecified: Secondary | ICD-10-CM

## 2014-10-07 LAB — MYOCARDIAL PERFUSION IMAGING
Estimated workload: 9.3 METS
Exercise duration (min): 7 min
Exercise duration (sec): 33 s
LV dias vol: 121 mL
LV sys vol: 47 mL
MPHR: 141 {beats}/min
Peak HR: 136 {beats}/min
Percent HR: 96 %
RPE: 17
Rest HR: 53 {beats}/min
SDS: 0
SRS: 0
SSS: 0
TID: 0.97

## 2014-10-07 MED ORDER — TECHNETIUM TC 99M SESTAMIBI GENERIC - CARDIOLITE
10.9000 | Freq: Once | INTRAVENOUS | Status: AC | PRN
  Administered 2014-10-07: 10.9 via INTRAVENOUS

## 2014-10-07 MED ORDER — TECHNETIUM TC 99M SESTAMIBI GENERIC - CARDIOLITE
30.1000 | Freq: Once | INTRAVENOUS | Status: AC | PRN
  Administered 2014-10-07: 30.1 via INTRAVENOUS

## 2014-10-27 DIAGNOSIS — N183 Chronic kidney disease, stage 3 (moderate): Secondary | ICD-10-CM | POA: Diagnosis not present

## 2014-10-27 DIAGNOSIS — I129 Hypertensive chronic kidney disease with stage 1 through stage 4 chronic kidney disease, or unspecified chronic kidney disease: Secondary | ICD-10-CM | POA: Diagnosis not present

## 2014-10-27 DIAGNOSIS — E0822 Diabetes mellitus due to underlying condition with diabetic chronic kidney disease: Secondary | ICD-10-CM | POA: Diagnosis not present

## 2014-11-10 DIAGNOSIS — E118 Type 2 diabetes mellitus with unspecified complications: Secondary | ICD-10-CM | POA: Diagnosis not present

## 2014-11-11 DIAGNOSIS — M545 Low back pain: Secondary | ICD-10-CM | POA: Diagnosis not present

## 2014-11-11 DIAGNOSIS — M5416 Radiculopathy, lumbar region: Secondary | ICD-10-CM | POA: Diagnosis not present

## 2014-11-12 DIAGNOSIS — M5416 Radiculopathy, lumbar region: Secondary | ICD-10-CM | POA: Diagnosis not present

## 2014-11-17 DIAGNOSIS — M5416 Radiculopathy, lumbar region: Secondary | ICD-10-CM | POA: Diagnosis not present

## 2014-11-17 DIAGNOSIS — M549 Dorsalgia, unspecified: Secondary | ICD-10-CM | POA: Diagnosis not present

## 2014-12-03 DIAGNOSIS — M5416 Radiculopathy, lumbar region: Secondary | ICD-10-CM | POA: Diagnosis not present

## 2014-12-14 DIAGNOSIS — M0589 Other rheumatoid arthritis with rheumatoid factor of multiple sites: Secondary | ICD-10-CM | POA: Diagnosis not present

## 2014-12-14 DIAGNOSIS — M1990 Unspecified osteoarthritis, unspecified site: Secondary | ICD-10-CM | POA: Diagnosis not present

## 2014-12-14 DIAGNOSIS — N181 Chronic kidney disease, stage 1: Secondary | ICD-10-CM | POA: Diagnosis not present

## 2014-12-14 DIAGNOSIS — M25562 Pain in left knee: Secondary | ICD-10-CM | POA: Diagnosis not present

## 2014-12-14 DIAGNOSIS — Z79899 Other long term (current) drug therapy: Secondary | ICD-10-CM | POA: Diagnosis not present

## 2014-12-29 DIAGNOSIS — M5416 Radiculopathy, lumbar region: Secondary | ICD-10-CM | POA: Diagnosis not present

## 2015-01-18 DIAGNOSIS — M5416 Radiculopathy, lumbar region: Secondary | ICD-10-CM | POA: Diagnosis not present

## 2015-01-29 DIAGNOSIS — E0822 Diabetes mellitus due to underlying condition with diabetic chronic kidney disease: Secondary | ICD-10-CM | POA: Diagnosis not present

## 2015-01-29 DIAGNOSIS — N183 Chronic kidney disease, stage 3 (moderate): Secondary | ICD-10-CM | POA: Diagnosis not present

## 2015-01-29 DIAGNOSIS — I129 Hypertensive chronic kidney disease with stage 1 through stage 4 chronic kidney disease, or unspecified chronic kidney disease: Secondary | ICD-10-CM | POA: Diagnosis not present

## 2015-02-09 DIAGNOSIS — N189 Chronic kidney disease, unspecified: Secondary | ICD-10-CM | POA: Diagnosis not present

## 2015-02-09 DIAGNOSIS — M0589 Other rheumatoid arthritis with rheumatoid factor of multiple sites: Secondary | ICD-10-CM | POA: Diagnosis not present

## 2015-02-09 DIAGNOSIS — I10 Essential (primary) hypertension: Secondary | ICD-10-CM | POA: Diagnosis not present

## 2015-02-09 DIAGNOSIS — Z125 Encounter for screening for malignant neoplasm of prostate: Secondary | ICD-10-CM | POA: Diagnosis not present

## 2015-02-09 DIAGNOSIS — E119 Type 2 diabetes mellitus without complications: Secondary | ICD-10-CM | POA: Diagnosis not present

## 2015-02-16 DIAGNOSIS — E118 Type 2 diabetes mellitus with unspecified complications: Secondary | ICD-10-CM | POA: Diagnosis not present

## 2015-02-16 DIAGNOSIS — I2510 Atherosclerotic heart disease of native coronary artery without angina pectoris: Secondary | ICD-10-CM | POA: Diagnosis not present

## 2015-02-16 DIAGNOSIS — E789 Disorder of lipoprotein metabolism, unspecified: Secondary | ICD-10-CM | POA: Diagnosis not present

## 2015-02-16 DIAGNOSIS — N390 Urinary tract infection, site not specified: Secondary | ICD-10-CM | POA: Diagnosis not present

## 2015-02-16 DIAGNOSIS — N3281 Overactive bladder: Secondary | ICD-10-CM | POA: Diagnosis not present

## 2015-03-12 DIAGNOSIS — E113293 Type 2 diabetes mellitus with mild nonproliferative diabetic retinopathy without macular edema, bilateral: Secondary | ICD-10-CM | POA: Diagnosis not present

## 2015-03-12 DIAGNOSIS — H26493 Other secondary cataract, bilateral: Secondary | ICD-10-CM | POA: Diagnosis not present

## 2015-03-12 DIAGNOSIS — H35372 Puckering of macula, left eye: Secondary | ICD-10-CM | POA: Diagnosis not present

## 2015-03-12 DIAGNOSIS — H04123 Dry eye syndrome of bilateral lacrimal glands: Secondary | ICD-10-CM | POA: Diagnosis not present

## 2015-03-16 DIAGNOSIS — N3281 Overactive bladder: Secondary | ICD-10-CM | POA: Diagnosis not present

## 2015-03-16 DIAGNOSIS — E789 Disorder of lipoprotein metabolism, unspecified: Secondary | ICD-10-CM | POA: Diagnosis not present

## 2015-03-16 DIAGNOSIS — N181 Chronic kidney disease, stage 1: Secondary | ICD-10-CM | POA: Diagnosis not present

## 2015-03-23 DIAGNOSIS — E118 Type 2 diabetes mellitus with unspecified complications: Secondary | ICD-10-CM | POA: Diagnosis not present

## 2015-03-23 DIAGNOSIS — E789 Disorder of lipoprotein metabolism, unspecified: Secondary | ICD-10-CM | POA: Diagnosis not present

## 2015-03-23 DIAGNOSIS — I10 Essential (primary) hypertension: Secondary | ICD-10-CM | POA: Diagnosis not present

## 2015-04-01 DIAGNOSIS — Z8546 Personal history of malignant neoplasm of prostate: Secondary | ICD-10-CM | POA: Diagnosis not present

## 2015-04-08 DIAGNOSIS — Z0000 Encounter for general adult medical examination without abnormal findings: Secondary | ICD-10-CM | POA: Diagnosis not present

## 2015-04-08 DIAGNOSIS — M1990 Unspecified osteoarthritis, unspecified site: Secondary | ICD-10-CM | POA: Diagnosis not present

## 2015-04-08 DIAGNOSIS — M25562 Pain in left knee: Secondary | ICD-10-CM | POA: Diagnosis not present

## 2015-04-08 DIAGNOSIS — N3941 Urge incontinence: Secondary | ICD-10-CM | POA: Diagnosis not present

## 2015-04-08 DIAGNOSIS — Z79899 Other long term (current) drug therapy: Secondary | ICD-10-CM | POA: Diagnosis not present

## 2015-04-08 DIAGNOSIS — Z8546 Personal history of malignant neoplasm of prostate: Secondary | ICD-10-CM | POA: Diagnosis not present

## 2015-04-08 DIAGNOSIS — N401 Enlarged prostate with lower urinary tract symptoms: Secondary | ICD-10-CM | POA: Diagnosis not present

## 2015-04-08 DIAGNOSIS — N181 Chronic kidney disease, stage 1: Secondary | ICD-10-CM | POA: Diagnosis not present

## 2015-04-08 DIAGNOSIS — M0589 Other rheumatoid arthritis with rheumatoid factor of multiple sites: Secondary | ICD-10-CM | POA: Diagnosis not present

## 2015-04-08 DIAGNOSIS — N138 Other obstructive and reflux uropathy: Secondary | ICD-10-CM | POA: Diagnosis not present

## 2015-04-08 DIAGNOSIS — R3915 Urgency of urination: Secondary | ICD-10-CM | POA: Diagnosis not present

## 2015-04-27 DIAGNOSIS — N183 Chronic kidney disease, stage 3 (moderate): Secondary | ICD-10-CM | POA: Diagnosis not present

## 2015-04-27 DIAGNOSIS — I129 Hypertensive chronic kidney disease with stage 1 through stage 4 chronic kidney disease, or unspecified chronic kidney disease: Secondary | ICD-10-CM | POA: Diagnosis not present

## 2015-04-27 DIAGNOSIS — E0822 Diabetes mellitus due to underlying condition with diabetic chronic kidney disease: Secondary | ICD-10-CM | POA: Diagnosis not present

## 2015-05-10 DIAGNOSIS — I10 Essential (primary) hypertension: Secondary | ICD-10-CM | POA: Diagnosis not present

## 2015-05-10 DIAGNOSIS — E118 Type 2 diabetes mellitus with unspecified complications: Secondary | ICD-10-CM | POA: Diagnosis not present

## 2015-05-31 DIAGNOSIS — E789 Disorder of lipoprotein metabolism, unspecified: Secondary | ICD-10-CM | POA: Diagnosis not present

## 2015-05-31 DIAGNOSIS — E118 Type 2 diabetes mellitus with unspecified complications: Secondary | ICD-10-CM | POA: Diagnosis not present

## 2015-05-31 DIAGNOSIS — I10 Essential (primary) hypertension: Secondary | ICD-10-CM | POA: Diagnosis not present

## 2015-06-12 DIAGNOSIS — E119 Type 2 diabetes mellitus without complications: Secondary | ICD-10-CM | POA: Diagnosis not present

## 2015-06-12 DIAGNOSIS — M545 Low back pain: Secondary | ICD-10-CM | POA: Diagnosis not present

## 2015-07-06 DIAGNOSIS — M0589 Other rheumatoid arthritis with rheumatoid factor of multiple sites: Secondary | ICD-10-CM | POA: Diagnosis not present

## 2015-07-06 DIAGNOSIS — M1990 Unspecified osteoarthritis, unspecified site: Secondary | ICD-10-CM | POA: Diagnosis not present

## 2015-07-06 DIAGNOSIS — M25562 Pain in left knee: Secondary | ICD-10-CM | POA: Diagnosis not present

## 2015-07-06 DIAGNOSIS — N181 Chronic kidney disease, stage 1: Secondary | ICD-10-CM | POA: Diagnosis not present

## 2015-08-05 DIAGNOSIS — N189 Chronic kidney disease, unspecified: Secondary | ICD-10-CM | POA: Diagnosis not present

## 2015-08-05 DIAGNOSIS — Z0000 Encounter for general adult medical examination without abnormal findings: Secondary | ICD-10-CM | POA: Diagnosis not present

## 2015-08-05 DIAGNOSIS — E119 Type 2 diabetes mellitus without complications: Secondary | ICD-10-CM | POA: Diagnosis not present

## 2015-08-05 DIAGNOSIS — Z125 Encounter for screening for malignant neoplasm of prostate: Secondary | ICD-10-CM | POA: Diagnosis not present

## 2015-08-05 DIAGNOSIS — E118 Type 2 diabetes mellitus with unspecified complications: Secondary | ICD-10-CM | POA: Diagnosis not present

## 2015-08-05 DIAGNOSIS — I10 Essential (primary) hypertension: Secondary | ICD-10-CM | POA: Diagnosis not present

## 2015-08-12 DIAGNOSIS — I10 Essential (primary) hypertension: Secondary | ICD-10-CM | POA: Diagnosis not present

## 2015-08-12 DIAGNOSIS — E789 Disorder of lipoprotein metabolism, unspecified: Secondary | ICD-10-CM | POA: Diagnosis not present

## 2015-08-12 DIAGNOSIS — I2510 Atherosclerotic heart disease of native coronary artery without angina pectoris: Secondary | ICD-10-CM | POA: Diagnosis not present

## 2015-08-12 DIAGNOSIS — E118 Type 2 diabetes mellitus with unspecified complications: Secondary | ICD-10-CM | POA: Diagnosis not present

## 2015-10-06 ENCOUNTER — Encounter: Payer: Self-pay | Admitting: Internal Medicine

## 2015-10-07 ENCOUNTER — Encounter: Payer: Self-pay | Admitting: Internal Medicine

## 2015-10-07 ENCOUNTER — Encounter (INDEPENDENT_AMBULATORY_CARE_PROVIDER_SITE_OTHER): Payer: Self-pay

## 2015-10-07 ENCOUNTER — Ambulatory Visit (INDEPENDENT_AMBULATORY_CARE_PROVIDER_SITE_OTHER): Payer: Medicare Other | Admitting: Internal Medicine

## 2015-10-07 VITALS — BP 171/65 | HR 52 | Ht 72.0 in | Wt 184.8 lb

## 2015-10-07 DIAGNOSIS — I739 Peripheral vascular disease, unspecified: Secondary | ICD-10-CM | POA: Diagnosis not present

## 2015-10-07 DIAGNOSIS — Z951 Presence of aortocoronary bypass graft: Secondary | ICD-10-CM | POA: Diagnosis not present

## 2015-10-07 DIAGNOSIS — G4710 Hypersomnia, unspecified: Secondary | ICD-10-CM

## 2015-10-07 DIAGNOSIS — I25810 Atherosclerosis of coronary artery bypass graft(s) without angina pectoris: Secondary | ICD-10-CM

## 2015-10-07 DIAGNOSIS — I10 Essential (primary) hypertension: Secondary | ICD-10-CM

## 2015-10-07 DIAGNOSIS — N184 Chronic kidney disease, stage 4 (severe): Secondary | ICD-10-CM

## 2015-10-07 NOTE — Patient Instructions (Signed)
Dr. Debara Pickett is going to communicate with Dr. Mercy Moore about your blood pressure - possible resuming amlodipine (which you were previously taking up until your last appointment with Dr. Debara Pickett in July of 2016  Your physician wants you to follow-up in: ONE YEAR with Dr. Debara Pickett. You will receive a reminder letter in the mail two months in advance. If you don't receive a letter, please call our office to schedule the follow-up appointment.

## 2015-10-07 NOTE — Progress Notes (Signed)
OFFICE NOTE  Chief Complaint:  Blood pressure has been high, memory loss  Primary Care Physician: Dwan Bolt, MD  HPI:  [name] is a 82 year old with a history of coronary artery disease status post CABG in 1987, hypertension, dyslipidemia, diabetes, and peripheral arterial disease which is mild. Over the past year he has done well from a cardiac standpoint. He does have a history of prostate cancer and underwent radiation seed implant for that. He has also been having problems with confusion and brain fog and his medications were adjusted, including holding his statin, which did not cause much improvement. This may be some early dementia. In addition, he has abnormal lower extremity Dopplers, which I repeated. This has actually shown mild improvement in his ABIs with ABI 1.1 on the right and ABI of 0.92 on the left. The bilateral posterior tibials were occluded and there was 50% to 69% reduction in left common iliac and about a 0 to 49% reduction in the right superficial femoral. Despite this, he has no symptoms of claudication. He also denies any chest pain. He has had some shortness of breath and this, however, seems to be related to this episode over the past several months where he reported initially he was thought stung by a bee, developed swelling on the left side of his face and then subsequently developed extreme weakness and there as concern of a rheumatoid arthritis. His symptoms have improved with a steroid burst and tapered. He is currently on prednisone as well as methotrexate. He has also had recent dizziness. He reports head and sinus congestion, which may be contributing to his symptoms. However some of the dizziness is associated with walking up a hill at the end of exercise or walking down hills particularly. Is not associated with change in position or quick head movements.  There is some associated fatigue and chest fullness with exercise and improves  with rest, reminiscent of possible angina.  He underwent bilateral carotid Dopplers which showed a mild amount of plaque. A nuclear stress test performed which demonstrated no reversible ischemia and preserved ejection fraction. He still reports he has some mild dizziness and does fatigue a little at the beginning of exercise but improves when he continues to exercise.  He is generally without complaints today. He reports that recently blood work indicated that his kidney function was worsening and that he is now scheduled to see a nephrologist next Monday. I do not yet have those results in front of me. His metformin was discontinued due to this but he remains on lisinopril and that may need to be adjusted. He denies any specific anginal symptoms. He has some very mild claudication symptoms but it improves with walking. He continues to have a systolic murmur best heard over the aortic area and does have a history of aortic sclerosis in the past.  [name] returns today for follow-up. Unfortunately says he hasn't taken his medicine today. He is supposed to take hydralazine 100 mg 3 times a day and for some reason has not taken 2 doses this morning. Blood pressure was very high at 196/86 but he denied any chest pain or headache. He says he going to take his medicine when he gets home. Is also on Flomax but no other blood pressure medicines. This is probably due to worsening renal function. Currently his creatinine is around 3 and is followed by Dr. Mercy Moore. Is also on Lipitor and cholesterol was recently checked by Dr. Wilson Singer.  10/07/2015  [name] returns today for follow-up. Over the past year he's had no new complaints although his blood pressures been difficult to control. He's followed by Dr. Mercy Moore. He was previously on amlodipine but that's been discontinued. He is now on hydralazine, clonidine and valsartan. He did not take his medicines today and his blood pressure is elevated. He says  he takes the clonidine once or twice per day. He tries to void in the morning because it causes extreme fatigue. We had previously discussed the Catapres patch however cost was limiting. He also reports recently some worsening memory.  PMHx:  Past Medical History:  Diagnosis Date  . Coronary artery disease   . DM (diabetes mellitus) ([place])   . Dyslipidemia   . Hypertension   . Peripheral artery disease ([facility])    mild    Past Surgical History:  Procedure Laterality Date  . CORONARY ARTERY BYPASS GRAFT  03/06/85   LAD and first diagonal/circumflex (sequential saphenous vein graft,cardioplegia  . NM MYOCAR PERF WALL MOTION  09/12/2006   no ischemia  . US ECHOCARDIOGRAPHY  11/04/2008   trace TR & MR    FAMHx:  Family History  Problem Relation Age of Onset  . Heart attack Mother     SOCHx:   reports that he quit smoking about 43 years ago. His smoking use included Cigarettes. He has never used smokeless tobacco. He reports that he drinks alcohol. He reports that he does not use drugs.  ALLERGIES:  No Known Allergies  ROS: Pertinent items noted in HPI and remainder of comprehensive ROS otherwise negative.  HOME MEDS: Current Outpatient Prescriptions  Medication Sig Dispense Refill  . atorvastatin (LIPITOR) 40 MG tablet Take 40 mg by mouth daily.    . cloNIDine (CATAPRES) 0.1 MG tablet Take 1-2 tablets by mouth daily.  6  . folic acid (FOLVITE) 1 MG tablet Take 1 mg by mouth daily.    . furosemide (LASIX) 40 MG tablet Take 40 mg by mouth daily.    Marland Kitchen glimepiride (AMARYL) 4 MG tablet Take 4 mg by mouth daily with breakfast.     . hydrALAZINE (APRESOLINE) 100 MG tablet Take 100 mg by mouth 3 (three) times daily.    Marland Kitchen leflunomide (ARAVA) 10 MG tablet Take 20 mg by mouth daily.    . meloxicam (MOBIC) 7.5 MG tablet Take 7.5 mg by mouth daily.    . methotrexate (RHEUMATREX) 2.5 MG tablet Take 7.5 mg by mouth daily.     Marland Kitchen oxybutynin (DITROPAN) 5 MG tablet Take 5 mg by mouth 3 (three)  times daily.    . pioglitazone (ACTOS) 30 MG tablet Take 30 mg by mouth daily.  3  . predniSONE (DELTASONE) 10 MG tablet Take 1 tablet by mouth daily.  2  . tamsulosin (FLOMAX) 0.4 MG CAPS Take 1 capsule by mouth daily as needed.     . valsartan (DIOVAN) 160 MG tablet Take 160 mg by mouth daily.  99   No current facility-administered medications for this visit.     LABS/IMAGING: No results found for this or any previous visit (from the past 48 hour(s)). No results found.  VITALS: BP (!) 171/65   Pulse (!) 52   Ht 6' (1.829 m)   Wt 184 lb 12.8 oz (83.8 kg)   BMI 25.06 kg/m   EXAM: General appearance: alert and no distress Neck: no adenopathy, no carotid bruit, no JVD, supple, symmetrical, trachea midline and thyroid not enlarged, symmetric, no tenderness/mass/nodules Lungs:  clear to auscultation bilaterally Heart: regular rate and rhythm, S1, S2 normal, 2/6 systolic murmur, rub or gallop Abdomen: soft, non-tender; bowel sounds normal; no masses,  no organomegaly Extremities: extremities normal, atraumatic, no cyanosis or edema Pulses: 2+ and symmetric Skin: Skin color, texture, turgor normal. No rashes or lesions Neurologic: Grossly normal  EKG: Sinus bradycardia 52  ASSESSMENT: 1. Coronary artery disease status post CABG in 1987 2. Hypertension 3. Hyperlipidemia 4. Diabetes type 2 5. Mild PAD - intermittent claudication, not lifestyle limiting 6. Possible rheumatoid arthritis 7. Dizziness 8. Murmur  PLAN: 1.   Mr. Claywell denies any chest pain or worsening shortness of breath. Blood pressures not at goal. He may need to go back on amlodipine. Is not clear why this was stopped however I will defer this to Dr. Mercy Moore. He does have mild PAD but has no lifestyle limiting claudication. Were following a systolic murmur which will require repeat echocardiogram next year. Previously there was aortic sclerosis and 2015 however he may be developing mild stenosis.  Follow-up  annually or sooner as necessary.  Pixie Casino, MD, [facility] Attending Cardiologist [facility] Griselda Bramblett 10/07/2015, 9:06 AM

## 2015-12-06 DIAGNOSIS — I10 Essential (primary) hypertension: Secondary | ICD-10-CM | POA: Diagnosis not present

## 2015-12-06 DIAGNOSIS — E118 Type 2 diabetes mellitus with unspecified complications: Secondary | ICD-10-CM | POA: Diagnosis not present

## 2015-12-14 DIAGNOSIS — E118 Type 2 diabetes mellitus with unspecified complications: Secondary | ICD-10-CM | POA: Diagnosis not present

## 2015-12-14 DIAGNOSIS — I10 Essential (primary) hypertension: Secondary | ICD-10-CM | POA: Diagnosis not present

## 2015-12-14 DIAGNOSIS — I2510 Atherosclerotic heart disease of native coronary artery without angina pectoris: Secondary | ICD-10-CM | POA: Diagnosis not present

## 2015-12-14 DIAGNOSIS — R42 Dizziness and giddiness: Secondary | ICD-10-CM | POA: Diagnosis not present

## 2016-01-25 DIAGNOSIS — Z79899 Other long term (current) drug therapy: Secondary | ICD-10-CM | POA: Diagnosis not present

## 2016-01-25 DIAGNOSIS — N183 Chronic kidney disease, stage 3 (moderate): Secondary | ICD-10-CM | POA: Diagnosis not present

## 2016-01-25 DIAGNOSIS — M1712 Unilateral primary osteoarthritis, left knee: Secondary | ICD-10-CM | POA: Diagnosis not present

## 2016-01-25 DIAGNOSIS — M059 Rheumatoid arthritis with rheumatoid factor, unspecified: Secondary | ICD-10-CM | POA: Diagnosis not present

## 2016-03-08 DIAGNOSIS — I10 Essential (primary) hypertension: Secondary | ICD-10-CM | POA: Diagnosis not present

## 2016-03-08 DIAGNOSIS — E118 Type 2 diabetes mellitus with unspecified complications: Secondary | ICD-10-CM | POA: Diagnosis not present

## 2016-03-23 DIAGNOSIS — I10 Essential (primary) hypertension: Secondary | ICD-10-CM | POA: Diagnosis not present

## 2016-03-23 DIAGNOSIS — N289 Disorder of kidney and ureter, unspecified: Secondary | ICD-10-CM | POA: Diagnosis not present

## 2016-03-23 DIAGNOSIS — E118 Type 2 diabetes mellitus with unspecified complications: Secondary | ICD-10-CM | POA: Diagnosis not present

## 2016-03-23 DIAGNOSIS — E789 Disorder of lipoprotein metabolism, unspecified: Secondary | ICD-10-CM | POA: Diagnosis not present

## 2016-04-11 DIAGNOSIS — R3915 Urgency of urination: Secondary | ICD-10-CM | POA: Diagnosis not present

## 2016-04-26 DIAGNOSIS — N183 Chronic kidney disease, stage 3 (moderate): Secondary | ICD-10-CM | POA: Diagnosis not present

## 2016-04-26 DIAGNOSIS — Z79899 Other long term (current) drug therapy: Secondary | ICD-10-CM | POA: Diagnosis not present

## 2016-04-26 DIAGNOSIS — M059 Rheumatoid arthritis with rheumatoid factor, unspecified: Secondary | ICD-10-CM | POA: Diagnosis not present

## 2016-04-26 DIAGNOSIS — M1712 Unilateral primary osteoarthritis, left knee: Secondary | ICD-10-CM | POA: Diagnosis not present

## 2016-05-18 ENCOUNTER — Telehealth: Payer: Self-pay | Admitting: Internal Medicine

## 2016-05-18 NOTE — Telephone Encounter (Signed)
Spoke with pt states that he has been feeling a fluttering or tightness in his chest and SOB for a couple days. Pt denies any chest pain or pressure, nausea, palpitations. Pt states that this is exertional only. Pt states that his blood sugar is good somethimes a little high but no hypoglycemia. BP is running today this AM 138/71 HR 56, yesterday am 144/73 HR 59, 2pm 166/75 HR 60 last night 117/56 HR 54. Pt states that he is taking all medications as ordered except he is only taking his lasix PRN when he has swelling, or if he has to go out he will hold until he gets home. Pt informed to go to the ER if symptoms persist, worsen, or any new symptoms develop verbalizes understanding. Pt will wait for [facility] direction.

## 2016-05-18 NOTE — Telephone Encounter (Signed)
Pt says he have been lightheaded,sometimes shortness of breath and heart beating real hard. Pt says he needs to be seen.

## 2016-05-19 NOTE — Telephone Encounter (Signed)
Pt notified he states that he will go to the ER if warranted.

## 2016-05-19 NOTE — Telephone Encounter (Signed)
I agree .Marland Kitchen If his chest pain is concerning or worsening, he should have an ER evaluation.  Dr.H

## 2016-05-29 DIAGNOSIS — N2581 Secondary hyperparathyroidism of renal origin: Secondary | ICD-10-CM | POA: Diagnosis not present

## 2016-05-29 DIAGNOSIS — I129 Hypertensive chronic kidney disease with stage 1 through stage 4 chronic kidney disease, or unspecified chronic kidney disease: Secondary | ICD-10-CM | POA: Diagnosis not present

## 2016-05-29 DIAGNOSIS — N183 Chronic kidney disease, stage 3 (moderate): Secondary | ICD-10-CM | POA: Diagnosis not present

## 2016-05-29 DIAGNOSIS — E0822 Diabetes mellitus due to underlying condition with diabetic chronic kidney disease: Secondary | ICD-10-CM | POA: Diagnosis not present

## 2016-06-01 DIAGNOSIS — M179 Osteoarthritis of knee, unspecified: Secondary | ICD-10-CM | POA: Diagnosis not present

## 2016-06-14 DIAGNOSIS — H35372 Puckering of macula, left eye: Secondary | ICD-10-CM | POA: Diagnosis not present

## 2016-06-14 DIAGNOSIS — E113213 Type 2 diabetes mellitus with mild nonproliferative diabetic retinopathy with macular edema, bilateral: Secondary | ICD-10-CM | POA: Diagnosis not present

## 2016-06-14 DIAGNOSIS — H26493 Other secondary cataract, bilateral: Secondary | ICD-10-CM | POA: Diagnosis not present

## 2016-06-14 DIAGNOSIS — H04123 Dry eye syndrome of bilateral lacrimal glands: Secondary | ICD-10-CM | POA: Diagnosis not present

## 2016-07-26 DIAGNOSIS — Z79899 Other long term (current) drug therapy: Secondary | ICD-10-CM | POA: Diagnosis not present

## 2016-07-26 DIAGNOSIS — M059 Rheumatoid arthritis with rheumatoid factor, unspecified: Secondary | ICD-10-CM | POA: Diagnosis not present

## 2016-08-03 DIAGNOSIS — E119 Type 2 diabetes mellitus without complications: Secondary | ICD-10-CM | POA: Diagnosis not present

## 2016-08-07 DIAGNOSIS — Z0000 Encounter for general adult medical examination without abnormal findings: Secondary | ICD-10-CM | POA: Diagnosis not present

## 2016-08-07 DIAGNOSIS — N189 Chronic kidney disease, unspecified: Secondary | ICD-10-CM | POA: Diagnosis not present

## 2016-08-07 DIAGNOSIS — E119 Type 2 diabetes mellitus without complications: Secondary | ICD-10-CM | POA: Diagnosis not present

## 2016-08-07 DIAGNOSIS — Z125 Encounter for screening for malignant neoplasm of prostate: Secondary | ICD-10-CM | POA: Diagnosis not present

## 2016-08-15 DIAGNOSIS — E789 Disorder of lipoprotein metabolism, unspecified: Secondary | ICD-10-CM | POA: Diagnosis not present

## 2016-08-15 DIAGNOSIS — N289 Disorder of kidney and ureter, unspecified: Secondary | ICD-10-CM | POA: Diagnosis not present

## 2016-08-15 DIAGNOSIS — I10 Essential (primary) hypertension: Secondary | ICD-10-CM | POA: Diagnosis not present

## 2016-08-15 DIAGNOSIS — E118 Type 2 diabetes mellitus with unspecified complications: Secondary | ICD-10-CM | POA: Diagnosis not present

## 2016-08-24 DIAGNOSIS — E119 Type 2 diabetes mellitus without complications: Secondary | ICD-10-CM | POA: Diagnosis not present

## 2016-08-24 DIAGNOSIS — N183 Chronic kidney disease, stage 3 (moderate): Secondary | ICD-10-CM | POA: Diagnosis not present

## 2016-08-24 DIAGNOSIS — M1712 Unilateral primary osteoarthritis, left knee: Secondary | ICD-10-CM | POA: Diagnosis not present

## 2016-08-24 DIAGNOSIS — R200 Anesthesia of skin: Secondary | ICD-10-CM | POA: Diagnosis not present

## 2016-08-24 DIAGNOSIS — M059 Rheumatoid arthritis with rheumatoid factor, unspecified: Secondary | ICD-10-CM | POA: Diagnosis not present

## 2016-10-16 ENCOUNTER — Ambulatory Visit (INDEPENDENT_AMBULATORY_CARE_PROVIDER_SITE_OTHER): Payer: Medicare Other | Admitting: Internal Medicine

## 2016-10-16 ENCOUNTER — Encounter: Payer: Self-pay | Admitting: Internal Medicine

## 2016-10-16 VITALS — BP 160/70 | HR 58 | Ht 72.0 in | Wt 185.0 lb

## 2016-10-16 DIAGNOSIS — I350 Nonrheumatic aortic (valve) stenosis: Secondary | ICD-10-CM | POA: Insufficient documentation

## 2016-10-16 DIAGNOSIS — I10 Essential (primary) hypertension: Secondary | ICD-10-CM

## 2016-10-16 DIAGNOSIS — I25810 Atherosclerosis of coronary artery bypass graft(s) without angina pectoris: Secondary | ICD-10-CM | POA: Diagnosis not present

## 2016-10-16 DIAGNOSIS — Z951 Presence of aortocoronary bypass graft: Secondary | ICD-10-CM | POA: Diagnosis not present

## 2016-10-16 MED ORDER — IRBESARTAN 150 MG PO TABS: 150.0000 mg | ORAL_TABLET | ORAL | 5 refills | Status: DC

## 2016-10-16 NOTE — Progress Notes (Signed)
OFFICE NOTE  Chief Complaint:  Dizziness, elevated blood pressure  Primary Care Physician: Anda Kraft, MD  HPI:  [name] is a 83 year old with a history of coronary artery disease status post CABG in 1987, hypertension, dyslipidemia, diabetes, and peripheral arterial disease which is mild. Over the past year he has done well from a cardiac standpoint. He does have a history of prostate cancer and underwent radiation seed implant for that. He has also been having problems with confusion and brain fog and his medications were adjusted, including holding his statin, which did not cause much improvement. This may be some early dementia. In addition, he has abnormal lower extremity Dopplers, which I repeated. This has actually shown mild improvement in his ABIs with ABI 1.1 on the right and ABI of 0.92 on the left. The bilateral posterior tibials were occluded and there was 50% to 69% reduction in left common iliac and about a 0 to 49% reduction in the right superficial femoral. Despite this, he has no symptoms of claudication. He also denies any chest pain. He has had some shortness of breath and this, however, seems to be related to this episode over the past several months where he reported initially he was thought stung by a bee, developed swelling on the left side of his face and then subsequently developed extreme weakness and there as concern of a rheumatoid arthritis. His symptoms have improved with a steroid burst and tapered. He is currently on prednisone as well as methotrexate. He has also had recent dizziness. He reports head and sinus congestion, which may be contributing to his symptoms. However some of the dizziness is associated with walking up a hill at the end of exercise or walking down hills particularly. Is not associated with change in position or quick head movements.  There is some associated fatigue and chest fullness with exercise and improves with rest,  reminiscent of possible angina.  He underwent bilateral carotid Dopplers which showed a mild amount of plaque. A nuclear stress test performed which demonstrated no reversible ischemia and preserved ejection fraction. He still reports he has some mild dizziness and does fatigue a little at the beginning of exercise but improves when he continues to exercise.  He is generally without complaints today. He reports that recently blood work indicated that his kidney function was worsening and that he is now scheduled to see a nephrologist next Monday. I do not yet have those results in front of me. His metformin was discontinued due to this but he remains on lisinopril and that may need to be adjusted. He denies any specific anginal symptoms. He has some very mild claudication symptoms but it improves with walking. He continues to have a systolic murmur best heard over the aortic area and does have a history of aortic sclerosis in the past.  [name] returns today for follow-up. Unfortunately says he hasn't taken his medicine today. He is supposed to take hydralazine 100 mg 3 times a day and for some reason has not taken 2 doses this morning. Blood pressure was very high at 196/86 but he denied any chest pain or headache. He says he going to take his medicine when he gets home. Is also on Flomax but no other blood pressure medicines. This is probably due to worsening renal function. Currently his creatinine is around 3 and is followed by Dr. Mercy Moore. Is also on Lipitor and cholesterol was recently checked by Dr. Wilson Singer.  10/07/2015  [name] returns today for follow-up. Over the past year he's had no new complaints although his blood pressures been difficult to control. He's followed by Dr. Mercy Moore. He was previously on amlodipine but that's been discontinued. He is now on hydralazine, clonidine and valsartan. He did not take his medicines today and his blood pressure is elevated. He says he takes the  clonidine once or twice per day. He tries to void in the morning because it causes extreme fatigue. We had previously discussed the Catapres patch however cost was limiting. He also reports recently some worsening memory.  10/16/2016  [name] returns for follow-up. Many years since I last saw him. He's called in for some chest pain which she said got better. His last stress test was in 2016 which was negative for ischemia. He was noted to have some aortic sclerosis in 2015 however last year was noted to have a louder systolic murmur which sounds more like aortic stenosis. Blood pressures not been well controlled. Today's elevated again 160/70. He says his blood pressure medicines make him feel tired. He told me that he takes all for his blood pressure medicines at night. When asked further about his hydralazine which is supposed to be taken 3 times a day he says he does not do that. He then reported that his blood pressure typically runs higher in the morning and throughout the day. He currently denies any chest pain. He reports some left lower strandy swelling but also has numbness and tingling has been undergoing and back injections.  PMHx:  Past Medical History:  Diagnosis Date  . Coronary artery disease   . DM (diabetes mellitus) ([place])   . Dyslipidemia   . Hypertension   . Peripheral artery disease (HCC)    mild    Past Surgical History:  Procedure Laterality Date  . CORONARY ARTERY BYPASS GRAFT  03/06/85   LAD and first diagonal/circumflex (sequential saphenous vein graft,cardioplegia  . NM MYOCAR PERF WALL MOTION  09/12/2006   no ischemia  . US ECHOCARDIOGRAPHY  11/04/2008   trace TR & MR    FAMHx:  Family History  Problem Relation Age of Onset  . Heart attack Mother     SOCHx:   reports that he quit smoking about 44 years ago. His smoking use included Cigarettes. He has never used smokeless tobacco. He reports that he drinks alcohol. He reports that he does not use  drugs.  ALLERGIES:  No Known Allergies  ROS: Pertinent items noted in HPI and remainder of comprehensive ROS otherwise negative.  HOME MEDS: Current Outpatient Prescriptions  Medication Sig Dispense Refill  . atorvastatin (LIPITOR) 40 MG tablet Take 40 mg by mouth at bedtime.     . cloNIDine (CATAPRES) 0.1 MG tablet Take 0.1 mg by mouth at bedtime.   6  . Empagliflozin-Linagliptin (GLYXAMBI) 10-5 MG TABS Take 1 tablet by mouth daily.    . folic acid (FOLVITE) 1 MG tablet Take 1 mg by mouth daily.    . furosemide (LASIX) 40 MG tablet Take 40 mg by mouth daily as needed.     . hydrALAZINE (APRESOLINE) 100 MG tablet Take 100 mg by mouth 3 (three) times daily.    . irbesartan (AVAPRO) 150 MG tablet Take 1 tablet (150 mg total) by mouth every morning. 30 tablet 5   No current facility-administered medications for this visit.     LABS/IMAGING: No results found for this or any previous visit (from the past 48 hour(s)). No results found.  VITALS: BP (!) 160/70   Pulse (!) 58   Ht 6' (1.829 m)   Wt 185 lb (83.9 kg)   BMI 25.09 kg/m   EXAM: General appearance: alert and no distress Neck: no adenopathy, no carotid bruit, no JVD, supple, symmetrical, trachea midline and thyroid not enlarged, symmetric, no tenderness/mass/nodules Lungs: clear to auscultation bilaterally Heart: regular rate and rhythm, S1, S2 normal, 2/6 systolic murmur, rub or gallop Abdomen: soft, non-tender; bowel sounds normal; no masses,  no organomegaly Extremities: extremities normal, atraumatic, no cyanosis or edema Pulses: 2+ and symmetric Skin: Skin color, texture, turgor normal. No rashes or lesions Neurologic: Grossly normal  EKG: Sinus bradycardia 58  ASSESSMENT: 1. Coronary artery disease status post CABG in 1987 2. Hypertension 3. Hyperlipidemia 4. Diabetes type 2 5. Mild PAD - intermittent claudication, not lifestyle limiting 6. Possible rheumatoid  arthritis 7. Dizziness 8. Murmur  PLAN: 1.   [name] has had labile blood pressures and I think this is related to the way he takes his medications. He does not seem to be taken the medicines as we had previously advised. I've advised him to take his ARB in the morning (valsartan will be switched to irbesartan due to the product recall), then he should take hydralazine 100 mg 3 times a day. Finally clonidine 0.1 mg daily at bedtime. We've written this out in detail for him. He should take his blood pressures twice weekly for the next couple weeks and bring that to his next appointment. We'll also obtain echocardiogram as his aortic murmur is louder has been 3 years since his last echo. I like to evaluate for aortic stenosis.  Follow-up with me in 6 months.  Pixie Casino, MD, [facility] Attending Cardiologist [place] 10/16/2016, 10:25 AM

## 2016-10-16 NOTE — Patient Instructions (Addendum)
Your physician has requested that you have an echocardiogram @ [address] - [address]. Echocardiography is a painless test that uses sound waves to create images of your heart. It provides your doctor with information about the size and shape of your heart and how well your heart's chambers and valves are working. This procedure takes approximately one hour. There are no restrictions for this procedure.  Your physician has recommended you make the following change in your medication:  -- STOP valsartan (medication is on recall) -- START irbesartan 150mg  once daily -- TAKE hydralazine THREE TIMES daily or every 8 hours -- TAKE furosemide (lasix) as needed -- TAKE clonidine 0.1mg  one tablet every night -- TAKE atorvastatin (lipitor) at night  Your physician recommends that you schedule a follow-up appointment after your echocardiogram (with Dr. Debara Pickett)

## 2016-10-23 ENCOUNTER — Other Ambulatory Visit: Payer: Self-pay

## 2016-10-23 ENCOUNTER — Ambulatory Visit (HOSPITAL_COMMUNITY): Payer: Medicare Other | Attending: Cardiology

## 2016-10-23 DIAGNOSIS — I2510 Atherosclerotic heart disease of native coronary artery without angina pectoris: Secondary | ICD-10-CM | POA: Diagnosis not present

## 2016-10-23 DIAGNOSIS — I350 Nonrheumatic aortic (valve) stenosis: Secondary | ICD-10-CM

## 2016-10-23 DIAGNOSIS — I272 Pulmonary hypertension, unspecified: Secondary | ICD-10-CM | POA: Insufficient documentation

## 2016-10-27 ENCOUNTER — Ambulatory Visit (INDEPENDENT_AMBULATORY_CARE_PROVIDER_SITE_OTHER): Payer: Medicare Other | Admitting: Internal Medicine

## 2016-10-27 ENCOUNTER — Encounter: Payer: Self-pay | Admitting: Internal Medicine

## 2016-10-27 VITALS — BP 184/74 | HR 61 | Ht 71.0 in | Wt 185.8 lb

## 2016-10-27 DIAGNOSIS — Z951 Presence of aortocoronary bypass graft: Secondary | ICD-10-CM | POA: Diagnosis not present

## 2016-10-27 DIAGNOSIS — I10 Essential (primary) hypertension: Secondary | ICD-10-CM

## 2016-10-27 DIAGNOSIS — I350 Nonrheumatic aortic (valve) stenosis: Secondary | ICD-10-CM

## 2016-10-27 NOTE — Progress Notes (Signed)
OFFICE NOTE  Chief Complaint:  Follow-up dizziness, hypertension  Primary Care Physician: Anda Kraft, MD  HPI:  [name] is a 83 year old with a history of coronary artery disease status post CABG in 1987, hypertension, dyslipidemia, diabetes, and peripheral arterial disease which is mild. Over the past year he has done well from a cardiac standpoint. He does have a history of prostate cancer and underwent radiation seed implant for that. He has also been having problems with confusion and brain fog and his medications were adjusted, including holding his statin, which did not cause much improvement. This may be some early dementia. In addition, he has abnormal lower extremity Dopplers, which I repeated. This has actually shown mild improvement in his ABIs with ABI 1.1 on the right and ABI of 0.92 on the left. The bilateral posterior tibials were occluded and there was 50% to 69% reduction in left common iliac and about a 0 to 49% reduction in the right superficial femoral. Despite this, he has no symptoms of claudication. He also denies any chest pain. He has had some shortness of breath and this, however, seems to be related to this episode over the past several months where he reported initially he was thought stung by a bee, developed swelling on the left side of his face and then subsequently developed extreme weakness and there as concern of a rheumatoid arthritis. His symptoms have improved with a steroid burst and tapered. He is currently on prednisone as well as methotrexate. He has also had recent dizziness. He reports head and sinus congestion, which may be contributing to his symptoms. However some of the dizziness is associated with walking up a hill at the end of exercise or walking down hills particularly. Is not associated with change in position or quick head movements.  There is some associated fatigue and chest fullness with exercise and improves with rest,  reminiscent of possible angina.  He underwent bilateral carotid Dopplers which showed a mild amount of plaque. A nuclear stress test performed which demonstrated no reversible ischemia and preserved ejection fraction. He still reports he has some mild dizziness and does fatigue a little at the beginning of exercise but improves when he continues to exercise.  He is generally without complaints today. He reports that recently blood work indicated that his kidney function was worsening and that he is now scheduled to see a nephrologist next Monday. I do not yet have those results in front of me. His metformin was discontinued due to this but he remains on lisinopril and that may need to be adjusted. He denies any specific anginal symptoms. He has some very mild claudication symptoms but it improves with walking. He continues to have a systolic murmur best heard over the aortic area and does have a history of aortic sclerosis in the past.  [name] returns today for follow-up. Unfortunately says he hasn't taken his medicine today. He is supposed to take hydralazine 100 mg 3 times a day and for some reason has not taken 2 doses this morning. Blood pressure was very high at 196/86 but he denied any chest pain or headache. He says he going to take his medicine when he gets home. Is also on Flomax but no other blood pressure medicines. This is probably due to worsening renal function. Currently his creatinine is around 3 and is followed by Dr. Mercy Moore. Is also on Lipitor and cholesterol was recently checked by Dr. Wilson Singer.  10/07/2015  [name]  returns today for follow-up. Over the past year he's had no new complaints although his blood pressures been difficult to control. He's followed by Dr. Mercy Moore. He was previously on amlodipine but that's been discontinued. He is now on hydralazine, clonidine and valsartan. He did not take his medicines today and his blood pressure is elevated. He says he takes the  clonidine once or twice per day. He tries to void in the morning because it causes extreme fatigue. We had previously discussed the Catapres patch however cost was limiting. He also reports recently some worsening memory.  10/16/2016  [name] returns for follow-up. Many years since I last saw him. He's called in for some chest pain which she said got better. His last stress test was in 2016 which was negative for ischemia. He was noted to have some aortic sclerosis in 2015 however last year was noted to have a louder systolic murmur which sounds more like aortic stenosis. Blood pressures not been well controlled. Today's elevated again 160/70. He says his blood pressure medicines make him feel tired. He told me that he takes all for his blood pressure medicines at night. When asked further about his hydralazine which is supposed to be taken 3 times a day he says he does not do that. He then reported that his blood pressure typically runs higher in the morning and throughout the day. He currently denies any chest pain. He reports some left lower strandy swelling but also has numbness and tingling has been undergoing and back injections.  10/27/2016  [name] had adjustments on his medications. He is now taking a hydralazine 3 times a day, irbesartan in the morning and clonidine at night. I reviewed his blood pressures which tend to range in the 140s in the morning before taking medication, in the 120s in the afternoon after medication and typically in the 120s over 80s at night. This indicates adequate treatment of his blood pressure. He did undergo an echocardiogram for aortic stenosis. This demonstrated a normal LVEF of 60-65% with mild aortic stenosis and a mean gradient of 11 mmHg. He understands the implications of this and that we will continue to follow his aortic valve disease.   PMHx:  Past Medical History:  Diagnosis Date  . Coronary artery disease   . DM (diabetes mellitus) ([place])   .  Dyslipidemia   . Hypertension   . Peripheral artery disease (HCC)    mild    Past Surgical History:  Procedure Laterality Date  . CORONARY ARTERY BYPASS GRAFT  03/06/85   LAD and first diagonal/circumflex (sequential saphenous vein graft,cardioplegia  . NM MYOCAR PERF WALL MOTION  09/12/2006   no ischemia  . US ECHOCARDIOGRAPHY  11/04/2008   trace TR & MR    FAMHx:  Family History  Problem Relation Age of Onset  . Heart attack Mother     SOCHx:   reports that he quit smoking about 44 years ago. His smoking use included Cigarettes. He has never used smokeless tobacco. He reports that he drinks alcohol. He reports that he does not use drugs.  ALLERGIES:  No Known Allergies  ROS: Pertinent items noted in HPI and remainder of comprehensive ROS otherwise negative.  HOME MEDS: Current Outpatient Prescriptions  Medication Sig Dispense Refill  . atorvastatin (LIPITOR) 40 MG tablet Take 40 mg by mouth at bedtime.     . cloNIDine (CATAPRES) 0.1 MG tablet Take 0.1 mg by mouth at bedtime.   6  . Empagliflozin-Linagliptin (GLYXAMBI)  10-5 MG TABS Take 1 tablet by mouth daily.    . folic acid (FOLVITE) 1 MG tablet Take 1 mg by mouth daily.    . furosemide (LASIX) 40 MG tablet Take 40 mg by mouth daily as needed.     . hydrALAZINE (APRESOLINE) 100 MG tablet Take 100 mg by mouth 3 (three) times daily.    . irbesartan (AVAPRO) 150 MG tablet Take 1 tablet (150 mg total) by mouth every morning. 30 tablet 5   No current facility-administered medications for this visit.     LABS/IMAGING: No results found for this or any previous visit (from the past 48 hour(s)). No results found.  VITALS: BP (!) 184/74   Pulse 61   Ht 5\' 11"  (1.803 m)   Wt 185 lb 12.8 oz (84.3 kg)   SpO2 100%   BMI 25.91 kg/m   EXAM: Deferred  EKG: Deferred  ASSESSMENT: 1. Coronary artery disease status post CABG in 1987 2. Hypertension 3. Hyperlipidemia 4. Diabetes type 2 5. Mild PAD - intermittent  claudication, not lifestyle limiting 6. Possible rheumatoid arthritis 7. Dizziness 8. Murmur  PLAN: 1.   Mr. Dearmas has mild aortic stenosis by echo. LV function is normal. His blood pressure is now better controlled and will continue this current regimen. He'll need a repeat echo in 1-2 years based on symptoms and clinical findings.  Follow-up with me in 6 months or sooner as necessary.  Pixie Casino, MD, [facility] Attending Cardiologist [place] 10/27/2016, 5:56 PM

## 2016-10-27 NOTE — Patient Instructions (Signed)
Your physician wants you to follow-up in: 6 months with Dr. Hilty. You will receive a reminder letter in the mail two months in advance. If you don't receive a letter, please call our office to schedule the follow-up appointment.    

## 2016-11-28 DIAGNOSIS — E0822 Diabetes mellitus due to underlying condition with diabetic chronic kidney disease: Secondary | ICD-10-CM | POA: Diagnosis not present

## 2016-11-28 DIAGNOSIS — I350 Nonrheumatic aortic (valve) stenosis: Secondary | ICD-10-CM | POA: Diagnosis not present

## 2016-11-28 DIAGNOSIS — I129 Hypertensive chronic kidney disease with stage 1 through stage 4 chronic kidney disease, or unspecified chronic kidney disease: Secondary | ICD-10-CM | POA: Diagnosis not present

## 2016-11-28 DIAGNOSIS — C61 Malignant neoplasm of prostate: Secondary | ICD-10-CM | POA: Diagnosis not present

## 2016-11-28 DIAGNOSIS — N183 Chronic kidney disease, stage 3 (moderate): Secondary | ICD-10-CM | POA: Diagnosis not present

## 2016-12-27 DIAGNOSIS — R609 Edema, unspecified: Secondary | ICD-10-CM | POA: Diagnosis not present

## 2016-12-28 DIAGNOSIS — M7121 Synovial cyst of popliteal space [Baker], right knee: Secondary | ICD-10-CM | POA: Diagnosis not present

## 2016-12-28 DIAGNOSIS — R600 Localized edema: Secondary | ICD-10-CM | POA: Diagnosis not present

## 2016-12-29 DIAGNOSIS — I10 Essential (primary) hypertension: Secondary | ICD-10-CM | POA: Diagnosis not present

## 2016-12-29 DIAGNOSIS — N183 Chronic kidney disease, stage 3 (moderate): Secondary | ICD-10-CM | POA: Diagnosis not present

## 2016-12-29 DIAGNOSIS — E1122 Type 2 diabetes mellitus with diabetic chronic kidney disease: Secondary | ICD-10-CM | POA: Diagnosis not present

## 2016-12-29 DIAGNOSIS — E119 Type 2 diabetes mellitus without complications: Secondary | ICD-10-CM | POA: Diagnosis not present

## 2017-01-29 DIAGNOSIS — E119 Type 2 diabetes mellitus without complications: Secondary | ICD-10-CM | POA: Diagnosis not present

## 2017-02-06 DIAGNOSIS — E119 Type 2 diabetes mellitus without complications: Secondary | ICD-10-CM | POA: Diagnosis not present

## 2017-02-06 DIAGNOSIS — I10 Essential (primary) hypertension: Secondary | ICD-10-CM | POA: Diagnosis not present

## 2017-02-12 DIAGNOSIS — E118 Type 2 diabetes mellitus with unspecified complications: Secondary | ICD-10-CM | POA: Diagnosis not present

## 2017-02-12 DIAGNOSIS — I10 Essential (primary) hypertension: Secondary | ICD-10-CM | POA: Diagnosis not present

## 2017-02-12 DIAGNOSIS — E119 Type 2 diabetes mellitus without complications: Secondary | ICD-10-CM | POA: Diagnosis not present

## 2017-02-12 DIAGNOSIS — N189 Chronic kidney disease, unspecified: Secondary | ICD-10-CM | POA: Diagnosis not present

## 2017-02-12 DIAGNOSIS — E756 Lipid storage disorder, unspecified: Secondary | ICD-10-CM | POA: Diagnosis not present

## 2017-02-15 DIAGNOSIS — M1712 Unilateral primary osteoarthritis, left knee: Secondary | ICD-10-CM | POA: Diagnosis not present

## 2017-02-15 DIAGNOSIS — R42 Dizziness and giddiness: Secondary | ICD-10-CM | POA: Diagnosis not present

## 2017-02-15 DIAGNOSIS — M65332 Trigger finger, left middle finger: Secondary | ICD-10-CM | POA: Diagnosis not present

## 2017-02-15 DIAGNOSIS — N183 Chronic kidney disease, stage 3 (moderate): Secondary | ICD-10-CM | POA: Diagnosis not present

## 2017-02-15 DIAGNOSIS — M059 Rheumatoid arthritis with rheumatoid factor, unspecified: Secondary | ICD-10-CM | POA: Diagnosis not present

## 2017-02-19 DIAGNOSIS — I739 Peripheral vascular disease, unspecified: Secondary | ICD-10-CM | POA: Insufficient documentation

## 2017-02-19 DIAGNOSIS — I2510 Atherosclerotic heart disease of native coronary artery without angina pectoris: Secondary | ICD-10-CM | POA: Insufficient documentation

## 2017-02-19 DIAGNOSIS — E119 Type 2 diabetes mellitus without complications: Secondary | ICD-10-CM | POA: Insufficient documentation

## 2017-02-19 DIAGNOSIS — I10 Essential (primary) hypertension: Secondary | ICD-10-CM | POA: Insufficient documentation

## 2017-02-28 ENCOUNTER — Ambulatory Visit (HOSPITAL_COMMUNITY): Payer: Medicare Other | Attending: Cardiovascular Disease

## 2017-02-28 ENCOUNTER — Encounter: Payer: Self-pay | Admitting: Internal Medicine

## 2017-02-28 ENCOUNTER — Other Ambulatory Visit: Payer: Self-pay

## 2017-02-28 ENCOUNTER — Ambulatory Visit: Payer: Medicare Other | Admitting: Internal Medicine

## 2017-02-28 VITALS — BP 183/75 | HR 71 | Ht 71.0 in | Wt 204.4 lb

## 2017-02-28 DIAGNOSIS — Z951 Presence of aortocoronary bypass graft: Secondary | ICD-10-CM

## 2017-02-28 DIAGNOSIS — Z79899 Other long term (current) drug therapy: Secondary | ICD-10-CM | POA: Diagnosis not present

## 2017-02-28 DIAGNOSIS — I071 Rheumatic tricuspid insufficiency: Secondary | ICD-10-CM | POA: Insufficient documentation

## 2017-02-28 DIAGNOSIS — I350 Nonrheumatic aortic (valve) stenosis: Secondary | ICD-10-CM

## 2017-02-28 DIAGNOSIS — I081 Rheumatic disorders of both mitral and tricuspid valves: Secondary | ICD-10-CM | POA: Diagnosis not present

## 2017-02-28 DIAGNOSIS — I313 Pericardial effusion (noninflammatory): Secondary | ICD-10-CM | POA: Diagnosis not present

## 2017-02-28 DIAGNOSIS — I509 Heart failure, unspecified: Secondary | ICD-10-CM

## 2017-02-28 DIAGNOSIS — I10 Essential (primary) hypertension: Secondary | ICD-10-CM | POA: Diagnosis not present

## 2017-02-28 LAB — BASIC METABOLIC PANEL
BUN/Creatinine Ratio: 7 — ABNORMAL LOW (ref 10–24)
BUN: 15 mg/dL (ref 8–27)
CO2: 27 mmol/L (ref 20–29)
Calcium: 8.8 mg/dL (ref 8.6–10.2)
Chloride: 102 mmol/L (ref 96–106)
Creatinine, Ser: 2.17 mg/dL — ABNORMAL HIGH (ref 0.76–1.27)
GFR calc Af Amer: 32 mL/min/{1.73_m2} — ABNORMAL LOW (ref >59)
GFR calc non Af Amer: 28 mL/min/{1.73_m2} — ABNORMAL LOW (ref >59)
Glucose: 235 mg/dL — ABNORMAL HIGH (ref 65–99)
Potassium: 3 mmol/L — ABNORMAL LOW (ref 3.5–5.2)
Sodium: 144 mmol/L (ref 134–144)

## 2017-02-28 LAB — ECHOCARDIOGRAM COMPLETE
Height: 71 in
Weight: 3270.4 oz

## 2017-02-28 LAB — PRO B NATRIURETIC PEPTIDE: NT-Pro BNP: 1277 pg/mL — ABNORMAL HIGH (ref 0–486)

## 2017-02-28 MED ORDER — CLONIDINE HCL 0.1 MG PO TABS: 0.2000 mg | ORAL_TABLET | Freq: Every day | ORAL | 5 refills | Status: DC

## 2017-02-28 MED ORDER — IRBESARTAN 300 MG PO TABS: 300.0000 mg | ORAL_TABLET | ORAL | 5 refills | Status: DC

## 2017-02-28 MED ORDER — FUROSEMIDE 40 MG PO TABS: 40.0000 mg | ORAL_TABLET | Freq: Two times a day (BID) | ORAL | 5 refills | Status: DC

## 2017-02-28 NOTE — Progress Notes (Signed)
OFFICE NOTE  Chief Complaint:  Short of breath, leg swelling, weight gain  Primary Care Physician: Anda Kraft, MD  HPI:  [name] is a 84 year old with a history of coronary artery disease status post CABG in 1987, hypertension, dyslipidemia, diabetes, and peripheral arterial disease which is mild. Over the past year he has done well from a cardiac standpoint. He does have a history of prostate cancer and underwent radiation seed implant for that. He has also been having problems with confusion and brain fog and his medications were adjusted, including holding his statin, which did not cause much improvement. This may be some early dementia. In addition, he has abnormal lower extremity Dopplers, which I repeated. This has actually shown mild improvement in his ABIs with ABI 1.1 on the right and ABI of 0.92 on the left. The bilateral posterior tibials were occluded and there was 50% to 69% reduction in left common iliac and about a 0 to 49% reduction in the right superficial femoral. Despite this, he has no symptoms of claudication. He also denies any chest pain. He has had some shortness of breath and this, however, seems to be related to this episode over the past several months where he reported initially he was thought stung by a bee, developed swelling on the left side of his face and then subsequently developed extreme weakness and there as concern of a rheumatoid arthritis. His symptoms have improved with a steroid burst and tapered. He is currently on prednisone as well as methotrexate. He has also had recent dizziness. He reports head and sinus congestion, which may be contributing to his symptoms. However some of the dizziness is associated with walking up a hill at the end of exercise or walking down hills particularly. Is not associated with change in position or quick head movements.  There is some associated fatigue and chest fullness with exercise and improves with rest,  reminiscent of possible angina.  He underwent bilateral carotid Dopplers which showed a mild amount of plaque. A nuclear stress test performed which demonstrated no reversible ischemia and preserved ejection fraction. He still reports he has some mild dizziness and does fatigue a little at the beginning of exercise but improves when he continues to exercise.  He is generally without complaints today. He reports that recently blood work indicated that his kidney function was worsening and that he is now scheduled to see a nephrologist next Monday. I do not yet have those results in front of me. His metformin was discontinued due to this but he remains on lisinopril and that may need to be adjusted. He denies any specific anginal symptoms. He has some very mild claudication symptoms but it improves with walking. He continues to have a systolic murmur best heard over the aortic area and does have a history of aortic sclerosis in the past.  [name] returns today for follow-up. Unfortunately says he hasn't taken his medicine today. He is supposed to take hydralazine 100 mg 3 times a day and for some reason has not taken 2 doses this morning. Blood pressure was very high at 196/86 but he denied any chest pain or headache. He says he going to take his medicine when he gets home. Is also on Flomax but no other blood pressure medicines. This is probably due to worsening renal function. Currently his creatinine is around 3 and is followed by Dr. Mercy Moore. Is also on Lipitor and cholesterol was recently checked by Dr. Wilson Singer.  10/07/2015  [name] returns today for follow-up. Over the past year he's had no new complaints although his blood pressures been difficult to control. He's followed by Dr. Mercy Moore. He was previously on amlodipine but that's been discontinued. He is now on hydralazine, clonidine and valsartan. He did not take his medicines today and his blood pressure is elevated. He says he takes the  clonidine once or twice per day. He tries to void in the morning because it causes extreme fatigue. We had previously discussed the Catapres patch however cost was limiting. He also reports recently some worsening memory.  10/16/2016  [name] returns for follow-up. Many years since I last saw him. He's called in for some chest pain which she said got better. His last stress test was in 2016 which was negative for ischemia. He was noted to have some aortic sclerosis in 2015 however last year was noted to have a louder systolic murmur which sounds more like aortic stenosis. Blood pressures not been well controlled. Today's elevated again 160/70. He says his blood pressure medicines make him feel tired. He told me that he takes all for his blood pressure medicines at night. When asked further about his hydralazine which is supposed to be taken 3 times a day he says he does not do that. He then reported that his blood pressure typically runs higher in the morning and throughout the day. He currently denies any chest pain. He reports some left lower strandy swelling but also has numbness and tingling has been undergoing and back injections.  10/27/2016  [name] had adjustments on his medications. He is now taking a hydralazine 3 times a day, irbesartan in the morning and clonidine at night. I reviewed his blood pressures which tend to range in the 140s in the morning before taking medication, in the 120s in the afternoon after medication and typically in the 120s over 80s at night. This indicates adequate treatment of his blood pressure. He did undergo an echocardiogram for aortic stenosis. This demonstrated a normal LVEF of 60-65% with mild aortic stenosis and a mean gradient of 11 mmHg. He understands the implications of this and that we will continue to follow his aortic valve disease.  02/28/2017  [name] presents today with significant shortness of breath, weight gain and leg edema as well as  increasing abdominal girth over the past 2-3 weeks.  The symptoms came on gradually but clinically worsened.  Weight in August was 185 pounds, he now weighs 204 pounds.  Reports worsening shortness of breath, orthopnea and PND.  Blood pressure was also elevated today 183/75 and recently has been difficult to control.  His primary care provider recommended increasing his clonidine to 0.1 mg 3 times daily, however he has had significant daytime fatigue with this.  PMHx:  Past Medical History:  Diagnosis Date  . Coronary artery disease   . DM (diabetes mellitus) ([place])   . Dyslipidemia   . Hypertension   . Peripheral artery disease ([facility])    mild    Past Surgical History:  Procedure Laterality Date  . CORONARY ARTERY BYPASS GRAFT  03/06/85   LAD and first diagonal/circumflex (sequential saphenous vein graft,cardioplegia  . NM MYOCAR PERF WALL MOTION  09/12/2006   no ischemia  . US ECHOCARDIOGRAPHY  11/04/2008   trace TR & MR    FAMHx:  Family History  Problem Relation Age of Onset  . Heart attack Mother     SOCHx:   reports that he  quit smoking about 44 years ago. His smoking use included cigarettes. he has never used smokeless tobacco. He reports that he drinks alcohol. He reports that he does not use drugs.  ALLERGIES:  No Known Allergies  ROS: Pertinent items noted in HPI and remainder of comprehensive ROS otherwise negative.  HOME MEDS: Current Outpatient Medications  Medication Sig Dispense Refill  . atorvastatin (LIPITOR) 40 MG tablet Take 40 mg by mouth at bedtime.     . cloNIDine (CATAPRES) 0.1 MG tablet Take 0.1 mg by mouth at bedtime.   6  . Empagliflozin-Linagliptin (GLYXAMBI) 10-5 MG TABS Take 1 tablet by mouth daily.    . folic acid (FOLVITE) 1 MG tablet Take 1 mg by mouth daily.    . furosemide (LASIX) 40 MG tablet Take 40 mg by mouth daily as needed.     Marland Kitchen glucose blood (ONE TOUCH ULTRA TEST) test strip TEST 1-2 TIMES PER DAY    . hydrALAZINE (APRESOLINE) 100 MG  tablet Take 100 mg by mouth 3 (three) times daily.    . irbesartan (AVAPRO) 150 MG tablet Take 1 tablet (150 mg total) by mouth every morning. 30 tablet 5  . Lancets (ONETOUCH ULTRASOFT) lancets USE TO TEST BLOOD SUGAR 1-2 TIMES DAILY    . mirabegron ER (MYRBETRIQ) 50 MG TB24 tablet Take by mouth.    . valsartan (DIOVAN) 160 MG tablet Take by mouth.    . Coenzyme Q10 (COQ10 PO) Take by mouth.    Marland Kitchen glimepiride (AMARYL) 2 MG tablet TAKE 1 TABLET BY MOUTH EVERY DAY WITH BREAKFAST OR FIRST MAIN MEAL OF THE DAY  6  . meloxicam (MOBIC) 7.5 MG tablet Take by mouth.    . oxybutynin (DITROPAN) 5 MG tablet Take by mouth.    . TRADJENTA 5 MG TABS tablet     . vitamin E 100 UNIT capsule Take by mouth.     No current facility-administered medications for this visit.     LABS/IMAGING: No results found for this or any previous visit (from the past 48 hour(s)). No results found.  VITALS: BP (!) 183/75   Pulse 71   Ht 5\' 11"  (1.803 m)   Wt 204 lb 6.4 oz (92.7 kg)   BMI 28.51 kg/m   EXAM: General appearance: alert, no distress and moderately obese Neck: JVD - 7 cm above sternal notch, no carotid bruit and thyroid not enlarged, symmetric, no tenderness/mass/nodules Lungs: diminished breath sounds bibasilar Heart: regular rate and rhythm, S1, S2 normal and systolic murmur: systolic ejection 3/6, crescendo at 2nd right intercostal space Abdomen: protuberant, firm, no fluid wave Extremities: edema 1+ bilateral pitting edema Pulses: 2+ and symmetric Skin: Skin color, texture, turgor normal. No rashes or lesions Neurologic: Grossly normal Psych: Pleasant  EKG: Normal sinus rhythm at 71, nonspecific ST and T wave changes-personally reviewed  ASSESSMENT: 1. Acute congestive heart failure 2. Mild aortic stenosis 3. Coronary artery disease status post CABG in 1987 4. Hypertension 5. Hyperlipidemia 6. Diabetes type 2 7. Mild PAD - intermittent claudication, not lifestyle limiting 8. Possible  rheumatoid arthritis 9. Dizziness  PLAN: 1.   [name] has developed acute congestive heart failure with significant abdominal distention and lower extremity swelling.  This is present over the past 3 weeks.  Blood pressures been poorly controlled.  He has been on increased dose clonidine without much benefit but is more fatigued.  He will need additional Lasix and I advised him to increase his Lasix to 40 mg twice daily.  Will double his irbesartan to 300 mg daily and decrease clonidine to 0.2 mg nightly.  He will lab work today including metabolic profile and BNP.  We will also obtain an echocardiogram to further evaluate whether he has systolic or diastolic congestive heart failure.  Follow-up with me in 2 weeks.  Pixie Casino, MD, [facility], [place] Director of the [facility] Attending Cardiologist  Direct Dial: [phone]  Fax: [phone]  Website:  [email or web address] 02/28/2017, 8:19 AM

## 2017-02-28 NOTE — Patient Instructions (Addendum)
Medication Instructions:   INCREASE lasix to 40mg  twice daily INCREASE irbesartan to 300mg  daily CHANGE clonidine 0.2mg  at night only  Labwork:  BMET, BNP TODAY  Testing/Procedures:  Your physician has requested that you have an echocardiogram @ [address] - [address]. Echocardiography is a painless test that uses sound waves to create images of your heart. It provides your doctor with information about the size and shape of your heart and how well your heart's chambers and valves are working. This procedure takes approximately one hour. There are no restrictions for this procedure.  Follow-Up:  Your physician recommends that you schedule a follow-up appointment in TWO - THREE weeks with Dr. Debara Pickett   If you need a refill on your cardiac medications before your next appointment, please call your pharmacy.  Any Other Special Instructions Will Be Listed Below (If Applicable).

## 2017-03-01 ENCOUNTER — Telehealth: Payer: Self-pay | Admitting: Internal Medicine

## 2017-03-01 DIAGNOSIS — Z79899 Other long term (current) drug therapy: Secondary | ICD-10-CM

## 2017-03-01 DIAGNOSIS — E876 Hypokalemia: Secondary | ICD-10-CM

## 2017-03-01 MED ORDER — POTASSIUM CHLORIDE ER 20 MEQ PO TBCR: 40.0000 meq | EXTENDED_RELEASE_TABLET | Freq: Two times a day (BID) | ORAL | 5 refills | Status: DC

## 2017-03-01 NOTE — Telephone Encounter (Signed)
Not sure why he would feel cold - thanks for contacting him.  Dr. Lemmie Evens

## 2017-03-01 NOTE — Telephone Encounter (Signed)
Patient states he gets cold easily and has positional dizziness. Explained to take change in position slowly and keep tabs on BP & HR. Advised he consult PCP for cold/temperature disturbances r/t diabetes, potential metabolic/endocrine issues.   Patient aware of lab & echo results. Voiced understanding of results & MD recommendations. Rx(s) sent to pharmacy electronically and lab ordered. Patient aware to have lab work end of next week or early the following week.

## 2017-03-01 NOTE — Telephone Encounter (Signed)
Notes recorded by Pixie Casino, MD on 02/28/2017 at 6:08 PM EST BNP and creatinine are elevated. Potassium is low at 3.0. Recommend adding potassium chloride 40 mEq twice daily with diuresis. Repeat metabolic profile in 1 week.  Dr. Debara Pickett

## 2017-03-01 NOTE — Telephone Encounter (Signed)
Patient called with MD advice

## 2017-03-01 NOTE — Telephone Encounter (Signed)
-----   Message from Pixie Casino, MD sent at 02/28/2017  6:08 PM EST ----- Echo consistent with moderate diastolic congestive heart failure.  Continue with plan for diuresis.  Dr. Debara Pickett

## 2017-03-05 IMAGING — NM NM MISC PROCEDURE
6 series · 36 of 36 positions shown · non-contrast
Comparison: none

[Series 1: wbr rest · 6.40mm/px · 6 of 64 frames shown]
[frame 6/64]
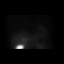
[frame 16/64]
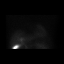
[frame 27/64]
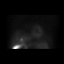
[frame 38/64]
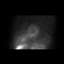
[frame 48/64]
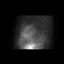
[frame 59/64]
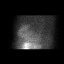

[Series 1: wbr_r-proj_st wbr rest · 6.40mm/px · 6 of 64 frames shown]
[frame 6/64]
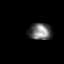
[frame 16/64]
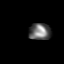
[frame 27/64]
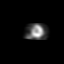
[frame 38/64]
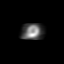
[frame 48/64]
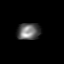
[frame 59/64]
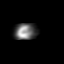

[Series 2: wbr_s-proj_st wbr stress-gsp · 6.40mm/px · 6 of 512 frames shown]
[frame 43/512]
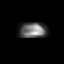
[frame 128/512]
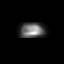
[frame 214/512]
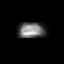
[frame 299/512]
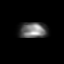
[frame 384/512]
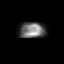
[frame 470/512]
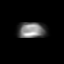

[Series 2: wbr stress-gsp · 6.40mm/px · 6 of 512 frames shown]
[frame 43/512]
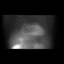
[frame 128/512]
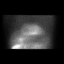
[frame 214/512]
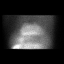
[frame 299/512]
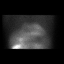
[frame 384/512]
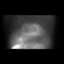
[frame 470/512]
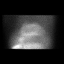

[Series 3: wbr_s-proj_st wbr stress-sum-em · 6.40mm/px · 6 of 64 frames shown]
[frame 6/64]
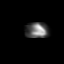
[frame 16/64]
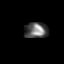
[frame 27/64]
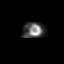
[frame 38/64]
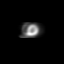
[frame 48/64]
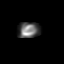
[frame 59/64]
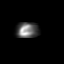

[Series 3: wbr stress-sum-em · 6.40mm/px · 6 of 64 frames shown]
[frame 6/64]
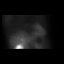
[frame 16/64]
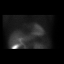
[frame 27/64]
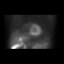
[frame 38/64]
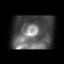
[frame 48/64]
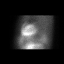
[frame 59/64]
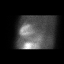

[36 of 36 positions shown; findings below may reference images not displayed]

Canned report from images found in remote index.

Refer to host system for actual result text.

## 2017-03-16 ENCOUNTER — Ambulatory Visit: Payer: Medicare Other | Admitting: Internal Medicine

## 2017-03-16 ENCOUNTER — Encounter: Payer: Self-pay | Admitting: Internal Medicine

## 2017-03-16 VITALS — BP 132/58 | HR 72 | Ht 71.0 in | Wt 182.6 lb

## 2017-03-16 DIAGNOSIS — I10 Essential (primary) hypertension: Secondary | ICD-10-CM

## 2017-03-16 DIAGNOSIS — I350 Nonrheumatic aortic (valve) stenosis: Secondary | ICD-10-CM | POA: Diagnosis not present

## 2017-03-16 DIAGNOSIS — I5031 Acute diastolic (congestive) heart failure: Secondary | ICD-10-CM

## 2017-03-16 DIAGNOSIS — Z79899 Other long term (current) drug therapy: Secondary | ICD-10-CM | POA: Diagnosis not present

## 2017-03-16 DIAGNOSIS — Z951 Presence of aortocoronary bypass graft: Secondary | ICD-10-CM | POA: Diagnosis not present

## 2017-03-16 LAB — BASIC METABOLIC PANEL
BUN/Creatinine Ratio: 12 (ref 10–24)
BUN: 29 mg/dL — ABNORMAL HIGH (ref 8–27)
CO2: 23 mmol/L (ref 20–29)
Calcium: 9 mg/dL (ref 8.6–10.2)
Chloride: 105 mmol/L (ref 96–106)
Creatinine, Ser: 2.46 mg/dL — ABNORMAL HIGH (ref 0.76–1.27)
GFR calc Af Amer: 27 mL/min/{1.73_m2} — ABNORMAL LOW (ref >59)
GFR calc non Af Amer: 24 mL/min/{1.73_m2} — ABNORMAL LOW (ref >59)
Glucose: 198 mg/dL — ABNORMAL HIGH (ref 65–99)
Potassium: 4.8 mmol/L (ref 3.5–5.2)
Sodium: 145 mmol/L — ABNORMAL HIGH (ref 134–144)

## 2017-03-16 NOTE — Patient Instructions (Addendum)
Your physician recommends that you return for lab work TODAY - BMET  Your physician recommends that you schedule a follow-up appointment in [place] with Dr. Debara Pickett.

## 2017-03-16 NOTE — Progress Notes (Signed)
OFFICE NOTE  Chief Complaint:  Follow-up CHF  Primary Care Physician: Patient, No Pcp Per  HPI:  [name] is a 84 year old with a history of coronary artery disease status post CABG in 1987, hypertension, dyslipidemia, diabetes, and peripheral arterial disease which is mild. Over the past year he has done well from a cardiac standpoint. He does have a history of prostate cancer and underwent radiation seed implant for that. He has also been having problems with confusion and brain fog and his medications were adjusted, including holding his statin, which did not cause much improvement. This may be some early dementia. In addition, he has abnormal lower extremity Dopplers, which I repeated. This has actually shown mild improvement in his ABIs with ABI 1.1 on the right and ABI of 0.92 on the left. The bilateral posterior tibials were occluded and there was 50% to 69% reduction in left common iliac and about a 0 to 49% reduction in the right superficial femoral. Despite this, he has no symptoms of claudication. He also denies any chest pain. He has had some shortness of breath and this, however, seems to be related to this episode over the past several months where he reported initially he was thought stung by a bee, developed swelling on the left side of his face and then subsequently developed extreme weakness and there as concern of a rheumatoid arthritis. His symptoms have improved with a steroid burst and tapered. He is currently on prednisone as well as methotrexate. He has also had recent dizziness. He reports head and sinus congestion, which may be contributing to his symptoms. However some of the dizziness is associated with walking up a hill at the end of exercise or walking down hills particularly. Is not associated with change in position or quick head movements.  There is some associated fatigue and chest fullness with exercise and improves with rest, reminiscent of possible  angina.  He underwent bilateral carotid Dopplers which showed a mild amount of plaque. A nuclear stress test performed which demonstrated no reversible ischemia and preserved ejection fraction. He still reports he has some mild dizziness and does fatigue a little at the beginning of exercise but improves when he continues to exercise.  He is generally without complaints today. He reports that recently blood work indicated that his kidney function was worsening and that he is now scheduled to see a nephrologist next Monday. I do not yet have those results in front of me. His metformin was discontinued due to this but he remains on lisinopril and that may need to be adjusted. He denies any specific anginal symptoms. He has some very mild claudication symptoms but it improves with walking. He continues to have a systolic murmur best heard over the aortic area and does have a history of aortic sclerosis in the past.  [name] returns today for follow-up. Unfortunately says he hasn't taken his medicine today. He is supposed to take hydralazine 100 mg 3 times a day and for some reason has not taken 2 doses this morning. Blood pressure was very high at 196/86 but he denied any chest pain or headache. He says he going to take his medicine when he gets home. Is also on Flomax but no other blood pressure medicines. This is probably due to worsening renal function. Currently his creatinine is around 3 and is followed by Dr. Mercy Moore. Is also on Lipitor and cholesterol was recently checked by Dr. Wilson Singer.  10/07/2015  [name]  returns today for follow-up. Over the past year he's had no new complaints although his blood pressures been difficult to control. He's followed by Dr. Mercy Moore. He was previously on amlodipine but that's been discontinued. He is now on hydralazine, clonidine and valsartan. He did not take his medicines today and his blood pressure is elevated. He says he takes the clonidine once or twice  per day. He tries to void in the morning because it causes extreme fatigue. We had previously discussed the Catapres patch however cost was limiting. He also reports recently some worsening memory.  10/16/2016  [name] returns for follow-up. Many years since I last saw him. He's called in for some chest pain which she said got better. His last stress test was in 2016 which was negative for ischemia. He was noted to have some aortic sclerosis in 2015 however last year was noted to have a louder systolic murmur which sounds more like aortic stenosis. Blood pressures not been well controlled. Today's elevated again 160/70. He says his blood pressure medicines make him feel tired. He told me that he takes all for his blood pressure medicines at night. When asked further about his hydralazine which is supposed to be taken 3 times a day he says he does not do that. He then reported that his blood pressure typically runs higher in the morning and throughout the day. He currently denies any chest pain. He reports some left lower strandy swelling but also has numbness and tingling has been undergoing and back injections.  10/27/2016  [name] had adjustments on his medications. He is now taking a hydralazine 3 times a day, irbesartan in the morning and clonidine at night. I reviewed his blood pressures which tend to range in the 140s in the morning before taking medication, in the 120s in the afternoon after medication and typically in the 120s over 80s at night. This indicates adequate treatment of his blood pressure. He did undergo an echocardiogram for aortic stenosis. This demonstrated a normal LVEF of 60-65% with mild aortic stenosis and a mean gradient of 11 mmHg. He understands the implications of this and that we will continue to follow his aortic valve disease.  02/28/2017  [name] presents today with significant shortness of breath, weight gain and leg edema as well as increasing abdominal  girth over the past 2-3 weeks.  The symptoms came on gradually but clinically worsened.  Weight in August was 185 pounds, he now weighs 204 pounds.  Reports worsening shortness of breath, orthopnea and PND.  Blood pressure was also elevated today 183/75 and recently has been difficult to control.  His primary care provider recommended increasing his clonidine to 0.1 mg 3 times daily, however he has had significant daytime fatigue with this.  03/16/2017  [name] returns today for follow-up of heart failure.  His weight back in August was 185 pounds and he gained up to 204 pounds.  When I last saw him I doubled his diuretic and increased his blood pressure medication.  Over the past 2 weeks he has diuresed back down to 182 pounds.  He feels markedly better.  Blood pressure is better controlled today 132/58.  He feels like his energy level has improved.  We obtain blood work when he was in congestive heart failure which showed an elevated creatinine over 2 and a baseline around 1.6.  He was hypokalemic and I increased his potassium.  His echo showed an EF of 65-70% with grade 2 diastolic  dysfunction and high LV filling pressures.   PMHx:  Past Medical History:  Diagnosis Date  . Coronary artery disease   . DM (diabetes mellitus) ([place])   . Dyslipidemia   . Hypertension   . Peripheral artery disease ([facility])    mild    Past Surgical History:  Procedure Laterality Date  . CORONARY ARTERY BYPASS GRAFT  03/06/85   LAD and first diagonal/circumflex (sequential saphenous vein graft,cardioplegia  . NM MYOCAR PERF WALL MOTION  09/12/2006   no ischemia  . US ECHOCARDIOGRAPHY  11/04/2008   trace TR & MR    FAMHx:  Family History  Problem Relation Age of Onset  . Heart attack Mother     SOCHx:   reports that he quit smoking about 44 years ago. His smoking use included cigarettes. he has never used smokeless tobacco. He reports that he drinks alcohol. He reports that he does not use  drugs.  ALLERGIES:  No Known Allergies  ROS: Pertinent items noted in HPI and remainder of comprehensive ROS otherwise negative.  HOME MEDS: Current Outpatient Medications  Medication Sig Dispense Refill  . atorvastatin (LIPITOR) 40 MG tablet Take 40 mg by mouth at bedtime.     . cloNIDine (CATAPRES) 0.1 MG tablet Take 2 tablets (0.2 mg total) by mouth at bedtime. 60 tablet 5  . Coenzyme Q10 (COQ10 PO) Take by mouth.    . folic acid (FOLVITE) 1 MG tablet Take 1 mg by mouth daily.    . furosemide (LASIX) 40 MG tablet Take 1 tablet (40 mg total) by mouth 2 (two) times daily. 60 tablet 5  . glimepiride (AMARYL) 2 MG tablet TAKE 1 TABLET BY MOUTH EVERY DAY WITH BREAKFAST OR FIRST MAIN MEAL OF THE DAY  6  . glucose blood (ONE TOUCH ULTRA TEST) test strip TEST 1-2 TIMES PER DAY    . hydrALAZINE (APRESOLINE) 100 MG tablet Take 100 mg by mouth 3 (three) times daily.    . irbesartan (AVAPRO) 300 MG tablet Take 1 tablet (300 mg total) by mouth every morning. 30 tablet 5  . Lancets (ONETOUCH ULTRASOFT) lancets USE TO TEST BLOOD SUGAR 1-2 TIMES DAILY    . pioglitazone (ACTOS) 30 MG tablet Take 30 mg by mouth daily.  1  . Potassium Chloride ER 20 MEQ TBCR Take 40 mEq by mouth 2 (two) times daily. 120 tablet 5  . TRADJENTA 5 MG TABS tablet     . vitamin E 100 UNIT capsule Take by mouth.     No current facility-administered medications for this visit.     LABS/IMAGING: No results found for this or any previous visit (from the past 48 hour(s)). No results found.  VITALS: BP (!) 132/58   Pulse 72   Ht 5\' 11"  (1.803 m)   Wt 182 lb 9.6 oz (82.8 kg)   BMI 25.47 kg/m   EXAM: General appearance: alert, no distress and moderately obese Neck: JVD - 2 cm above sternal notch, no carotid bruit and thyroid not enlarged, symmetric, no tenderness/mass/nodules Lungs: clear to auscultation bilaterally Heart: regular rate and rhythm, S1, S2 normal and systolic murmur: systolic ejection 3/6, crescendo at  2nd right intercostal space Abdomen: soft, non-tender; bowel sounds normal; no masses,  no organomegaly Extremities: edema trace bilateral edema Pulses: 2+ and symmetric Skin: Skin color, texture, turgor normal. No rashes or lesions Neurologic: Grossly normal Psych: Pleasant  EKG: Deferred  ASSESSMENT: 1. Acute diastolic congestive heart failure 2. AKI on CKD2 3. Mild aortic stenosis  4. Coronary artery disease status post CABG in 1987 5. Hypertension 6. Hyperlipidemia 7. Diabetes type 2 8. Mild PAD - intermittent claudication, not lifestyle limiting 9. Possible rheumatoid arthritis  PLAN: 1.   [name] has had marked improvement in his heart failure and his weight is now at or below his dry weight in August.  He notes marked improvement in his breathing and his dizziness is improved.  His blood pressure is now better controlled.  His creatinine was elevated when we initially saw him however I suspect it is improved with diuresis.  We will repeat a metabolic profile today and adjust his diuretics accordingly.  He continues to lose weight and I suspect we can probably decrease his diuretic to once daily 60 mg dosing.  This will be based on his metabolic profile today.  He is encouraged to continue to follow daily weights and if gaining more than 2-3 pounds should contact our office for adjustment in his diuretics.    Follow-up with me in 3 months.  Pixie Casino, MD, [facility], [place] Director of the [facility] Attending Cardiologist  Direct Dial: [phone]  Fax: [phone]  Website:  [email or web address] 03/16/2017, 8:29 AM

## 2017-03-19 ENCOUNTER — Telehealth: Payer: Self-pay | Admitting: Internal Medicine

## 2017-03-19 NOTE — Telephone Encounter (Signed)
Patient called w/MD recommendations. Med list updated.

## 2017-03-19 NOTE — Telephone Encounter (Signed)
Patient aware of results. He states he was only taking lasix 40mg  daily. He is still losing some weight. Advised him to HOLD lasix for 2 days (Tues, Wed) and I would check with MD on dose he should resume

## 2017-03-19 NOTE — Telephone Encounter (Signed)
Resume 40 mg daily after 2 days off lasix.  Thanks.

## 2017-03-19 NOTE — Telephone Encounter (Signed)
-----   Message from Pixie Casino, MD sent at 03/18/2017  2:30 PM EST ----- Creatinine up further - have decreased lasix dose to 60 mg. Check with him on Monday, 1/21 - if he is losing weight, he should stop the diuretic for 2 days and resume on Wednesday.  Dr Lemmie Evens

## 2017-03-21 DIAGNOSIS — H04123 Dry eye syndrome of bilateral lacrimal glands: Secondary | ICD-10-CM | POA: Diagnosis not present

## 2017-03-21 DIAGNOSIS — E113293 Type 2 diabetes mellitus with mild nonproliferative diabetic retinopathy without macular edema, bilateral: Secondary | ICD-10-CM | POA: Diagnosis not present

## 2017-04-02 DIAGNOSIS — E1122 Type 2 diabetes mellitus with diabetic chronic kidney disease: Secondary | ICD-10-CM | POA: Diagnosis not present

## 2017-04-02 DIAGNOSIS — E119 Type 2 diabetes mellitus without complications: Secondary | ICD-10-CM | POA: Diagnosis not present

## 2017-04-02 DIAGNOSIS — N183 Chronic kidney disease, stage 3 (moderate): Secondary | ICD-10-CM | POA: Diagnosis not present

## 2017-04-02 DIAGNOSIS — E7849 Other hyperlipidemia: Secondary | ICD-10-CM | POA: Diagnosis not present

## 2017-04-02 DIAGNOSIS — I10 Essential (primary) hypertension: Secondary | ICD-10-CM | POA: Diagnosis not present

## 2017-04-09 DIAGNOSIS — E119 Type 2 diabetes mellitus without complications: Secondary | ICD-10-CM | POA: Diagnosis not present

## 2017-04-09 DIAGNOSIS — N189 Chronic kidney disease, unspecified: Secondary | ICD-10-CM | POA: Diagnosis not present

## 2017-04-09 DIAGNOSIS — I10 Essential (primary) hypertension: Secondary | ICD-10-CM | POA: Diagnosis not present

## 2017-04-10 DIAGNOSIS — C61 Malignant neoplasm of prostate: Secondary | ICD-10-CM | POA: Diagnosis not present

## 2017-04-30 ENCOUNTER — Ambulatory Visit: Payer: Medicare Other | Admitting: Internal Medicine

## 2017-04-30 DIAGNOSIS — E119 Type 2 diabetes mellitus without complications: Secondary | ICD-10-CM | POA: Diagnosis not present

## 2017-04-30 DIAGNOSIS — E1122 Type 2 diabetes mellitus with diabetic chronic kidney disease: Secondary | ICD-10-CM | POA: Diagnosis not present

## 2017-04-30 DIAGNOSIS — N183 Chronic kidney disease, stage 3 (moderate): Secondary | ICD-10-CM | POA: Diagnosis not present

## 2017-04-30 DIAGNOSIS — E789 Disorder of lipoprotein metabolism, unspecified: Secondary | ICD-10-CM | POA: Diagnosis not present

## 2017-04-30 DIAGNOSIS — I2510 Atherosclerotic heart disease of native coronary artery without angina pectoris: Secondary | ICD-10-CM | POA: Diagnosis not present

## 2017-05-15 DIAGNOSIS — E119 Type 2 diabetes mellitus without complications: Secondary | ICD-10-CM | POA: Diagnosis not present

## 2017-05-17 DIAGNOSIS — D649 Anemia, unspecified: Secondary | ICD-10-CM | POA: Diagnosis not present

## 2017-05-23 DIAGNOSIS — E756 Lipid storage disorder, unspecified: Secondary | ICD-10-CM | POA: Diagnosis not present

## 2017-05-23 DIAGNOSIS — N181 Chronic kidney disease, stage 1: Secondary | ICD-10-CM | POA: Diagnosis not present

## 2017-05-23 DIAGNOSIS — I10 Essential (primary) hypertension: Secondary | ICD-10-CM | POA: Diagnosis not present

## 2017-05-23 DIAGNOSIS — E119 Type 2 diabetes mellitus without complications: Secondary | ICD-10-CM | POA: Diagnosis not present

## 2017-06-04 DIAGNOSIS — I2510 Atherosclerotic heart disease of native coronary artery without angina pectoris: Secondary | ICD-10-CM | POA: Diagnosis not present

## 2017-06-04 DIAGNOSIS — N183 Chronic kidney disease, stage 3 (moderate): Secondary | ICD-10-CM | POA: Diagnosis not present

## 2017-06-04 DIAGNOSIS — E1122 Type 2 diabetes mellitus with diabetic chronic kidney disease: Secondary | ICD-10-CM | POA: Diagnosis not present

## 2017-06-04 DIAGNOSIS — E119 Type 2 diabetes mellitus without complications: Secondary | ICD-10-CM | POA: Diagnosis not present

## 2017-06-04 DIAGNOSIS — E789 Disorder of lipoprotein metabolism, unspecified: Secondary | ICD-10-CM | POA: Diagnosis not present

## 2017-06-06 ENCOUNTER — Telehealth: Payer: Self-pay | Admitting: Internal Medicine

## 2017-06-06 NOTE — Telephone Encounter (Signed)
Notes recorded by Pixie Casino, MD on 06/04/2017 at 12:58 PM EDT LDL - 148 on scanned labs. If he is taking lipitor, he is far off goal LDL<70. Would arrange follow-up with me to discuss starting a [id number].  Dr H ____________ Patient states he is taking lipitor 40mg  QD - PCP did not change any cholesterol meds. Informed him MD will discuss potential med changes at his upcoming visit on 06/19/17

## 2017-06-06 NOTE — Telephone Encounter (Signed)
Follow Up:    Pt says he was returning Dr [facility] nurse call.

## 2017-06-14 DIAGNOSIS — N184 Chronic kidney disease, stage 4 (severe): Secondary | ICD-10-CM | POA: Diagnosis not present

## 2017-06-14 DIAGNOSIS — M059 Rheumatoid arthritis with rheumatoid factor, unspecified: Secondary | ICD-10-CM | POA: Diagnosis not present

## 2017-06-14 DIAGNOSIS — M65332 Trigger finger, left middle finger: Secondary | ICD-10-CM | POA: Diagnosis not present

## 2017-06-14 DIAGNOSIS — M1712 Unilateral primary osteoarthritis, left knee: Secondary | ICD-10-CM | POA: Diagnosis not present

## 2017-06-18 DIAGNOSIS — E1122 Type 2 diabetes mellitus with diabetic chronic kidney disease: Secondary | ICD-10-CM | POA: Diagnosis not present

## 2017-06-18 DIAGNOSIS — I350 Nonrheumatic aortic (valve) stenosis: Secondary | ICD-10-CM | POA: Diagnosis not present

## 2017-06-18 DIAGNOSIS — N183 Chronic kidney disease, stage 3 (moderate): Secondary | ICD-10-CM | POA: Diagnosis not present

## 2017-06-18 DIAGNOSIS — I129 Hypertensive chronic kidney disease with stage 1 through stage 4 chronic kidney disease, or unspecified chronic kidney disease: Secondary | ICD-10-CM | POA: Diagnosis not present

## 2017-06-19 ENCOUNTER — Encounter: Payer: Self-pay | Admitting: Internal Medicine

## 2017-06-19 ENCOUNTER — Ambulatory Visit: Payer: Medicare Other | Admitting: Internal Medicine

## 2017-06-19 VITALS — BP 138/70 | HR 70 | Ht 71.0 in | Wt 193.0 lb

## 2017-06-19 DIAGNOSIS — I350 Nonrheumatic aortic (valve) stenosis: Secondary | ICD-10-CM | POA: Diagnosis not present

## 2017-06-19 DIAGNOSIS — R5383 Other fatigue: Secondary | ICD-10-CM | POA: Insufficient documentation

## 2017-06-19 DIAGNOSIS — Z951 Presence of aortocoronary bypass graft: Secondary | ICD-10-CM

## 2017-06-19 DIAGNOSIS — N184 Chronic kidney disease, stage 4 (severe): Secondary | ICD-10-CM | POA: Diagnosis not present

## 2017-06-19 DIAGNOSIS — R0602 Shortness of breath: Secondary | ICD-10-CM | POA: Diagnosis not present

## 2017-06-19 NOTE — H&P (View-Only) (Signed)
OFFICE NOTE  Chief Complaint:  Follow-up CHF, fatigue, decreased exercise tolerance  Primary Care Physician: Jani Gravel, MD  HPI:  [name] is a 84 year old with a history of coronary artery disease status post CABG in 1987, hypertension, dyslipidemia, diabetes, and peripheral arterial disease which is mild. Over the past year he has done well from a cardiac standpoint. He does have a history of prostate cancer and underwent radiation seed implant for that. He has also been having problems with confusion and brain fog and his medications were adjusted, including holding his statin, which did not cause much improvement. This may be some early dementia. In addition, he has abnormal lower extremity Dopplers, which I repeated. This has actually shown mild improvement in his ABIs with ABI 1.1 on the right and ABI of 0.92 on the left. The bilateral posterior tibials were occluded and there was 50% to 69% reduction in left common iliac and about a 0 to 49% reduction in the right superficial femoral. Despite this, he has no symptoms of claudication. He also denies any chest pain. He has had some shortness of breath and this, however, seems to be related to this episode over the past several months where he reported initially he was thought stung by a bee, developed swelling on the left side of his face and then subsequently developed extreme weakness and there as concern of a rheumatoid arthritis. His symptoms have improved with a steroid burst and tapered. He is currently on prednisone as well as methotrexate. He has also had recent dizziness. He reports head and sinus congestion, which may be contributing to his symptoms. However some of the dizziness is associated with walking up a hill at the end of exercise or walking down hills particularly. Is not associated with change in position or quick head movements.  There is some associated fatigue and chest fullness with exercise and improves with  rest, reminiscent of possible angina.  He underwent bilateral carotid Dopplers which showed a mild amount of plaque. A nuclear stress test performed which demonstrated no reversible ischemia and preserved ejection fraction. He still reports he has some mild dizziness and does fatigue a little at the beginning of exercise but improves when he continues to exercise.  He is generally without complaints today. He reports that recently blood work indicated that his kidney function was worsening and that he is now scheduled to see a nephrologist next Monday. I do not yet have those results in front of me. His metformin was discontinued due to this but he remains on lisinopril and that may need to be adjusted. He denies any specific anginal symptoms. He has some very mild claudication symptoms but it improves with walking. He continues to have a systolic murmur best heard over the aortic area and does have a history of aortic sclerosis in the past.  [name] returns today for follow-up. Unfortunately says he hasn't taken his medicine today. He is supposed to take hydralazine 100 mg 3 times a day and for some reason has not taken 2 doses this morning. Blood pressure was very high at 196/86 but he denied any chest pain or headache. He says he going to take his medicine when he gets home. Is also on Flomax but no other blood pressure medicines. This is probably due to worsening renal function. Currently his creatinine is around 3 and is followed by Dr. Mercy Moore. Is also on Lipitor and cholesterol was recently checked by Dr. Wilson Singer.  10/07/2015  [name] returns today for follow-up. Over the past year he's had no new complaints although his blood pressures been difficult to control. He's followed by Dr. Mercy Moore. He was previously on amlodipine but that's been discontinued. He is now on hydralazine, clonidine and valsartan. He did not take his medicines today and his blood pressure is elevated. He says he  takes the clonidine once or twice per day. He tries to void in the morning because it causes extreme fatigue. We had previously discussed the Catapres patch however cost was limiting. He also reports recently some worsening memory.  10/16/2016  [name] returns for follow-up. Many years since I last saw him. He's called in for some chest pain which she said got better. His last stress test was in 2016 which was negative for ischemia. He was noted to have some aortic sclerosis in 2015 however last year was noted to have a louder systolic murmur which sounds more like aortic stenosis. Blood pressures not been well controlled. Today's elevated again 160/70. He says his blood pressure medicines make him feel tired. He told me that he takes all for his blood pressure medicines at night. When asked further about his hydralazine which is supposed to be taken 3 times a day he says he does not do that. He then reported that his blood pressure typically runs higher in the morning and throughout the day. He currently denies any chest pain. He reports some left lower strandy swelling but also has numbness and tingling has been undergoing and back injections.  10/27/2016  [name] had adjustments on his medications. He is now taking a hydralazine 3 times a day, irbesartan in the morning and clonidine at night. I reviewed his blood pressures which tend to range in the 140s in the morning before taking medication, in the 120s in the afternoon after medication and typically in the 120s over 80s at night. This indicates adequate treatment of his blood pressure. He did undergo an echocardiogram for aortic stenosis. This demonstrated a normal LVEF of 60-65% with mild aortic stenosis and a mean gradient of 11 mmHg. He understands the implications of this and that we will continue to follow his aortic valve disease.  02/28/2017  [name] presents today with significant shortness of breath, weight gain and leg edema  as well as increasing abdominal girth over the past 2-3 weeks.  The symptoms came on gradually but clinically worsened.  Weight in August was 185 pounds, he now weighs 204 pounds.  Reports worsening shortness of breath, orthopnea and PND.  Blood pressure was also elevated today 183/75 and recently has been difficult to control.  His primary care provider recommended increasing his clonidine to 0.1 mg 3 times daily, however he has had significant daytime fatigue with this.  03/16/2017  [name] returns today for follow-up of heart failure.  His weight back in August was 185 pounds and he gained up to 204 pounds.  When I last saw him I doubled his diuretic and increased his blood pressure medication.  Over the past 2 weeks he has diuresed back down to 182 pounds.  He feels markedly better.  Blood pressure is better controlled today 132/58.  He feels like his energy level has improved.  We obtain blood work when he was in congestive heart failure which showed an elevated creatinine over 2 and a baseline around 1.6.  He was hypokalemic and I increased his potassium.  His echo showed an EF of 65-70% with grade  2 diastolic dysfunction and high LV filling pressures.  06/19/2017  [name] was seen today in follow-up.  I last saw him in January, at which time he was complaining of shortness of breath and edema.  Weight was up to 204 pounds.  He was given diuretics and his weight has come down significantly.  In fact, his creatinine rose up to 2.7 and weight was down to 182.  He saw his nephrologist, Dr. Hollie Salk, recently who decreased his Lasix further and started amlodipine 10 mg daily.  He now says over the past several months he has had progressive fatigue, decreased exercise tolerance and worsening shortness of breath.  He wonders if it is related to his aortic valve.  In January was noted to have normal systolic function and mild aortic stenosis.  On exam, he has what sounds like moderate left ear with an  audible second heart sound.  He again looks volume overloaded today and weight is back up to 193 pounds, up from 182 pounds.  He denies any chest pain.  PMHx:  Past Medical History:  Diagnosis Date  . Coronary artery disease   . DM (diabetes mellitus) ([place])   . Dyslipidemia   . Hypertension   . Peripheral artery disease ([facility])    mild    Past Surgical History:  Procedure Laterality Date  . CORONARY ARTERY BYPASS GRAFT  03/06/85   LAD and first diagonal/circumflex (sequential saphenous vein graft,cardioplegia  . NM MYOCAR PERF WALL MOTION  09/12/2006   no ischemia  . US ECHOCARDIOGRAPHY  11/04/2008   trace TR & MR    FAMHx:  Family History  Problem Relation Age of Onset  . Heart attack Mother     SOCHx:   reports that he quit smoking about 44 years ago. His smoking use included cigarettes. He has never used smokeless tobacco. He reports that he drinks alcohol. He reports that he does not use drugs.  ALLERGIES:  No Known Allergies  ROS: Pertinent items noted in HPI and remainder of comprehensive ROS otherwise negative.  HOME MEDS: Current Outpatient Medications  Medication Sig Dispense Refill  . amLODipine (NORVASC) 10 MG tablet Take 10 mg by mouth at bedtime.    Marland Kitchen aspirin EC 81 MG tablet Take 81 mg by mouth daily.    Marland Kitchen atorvastatin (LIPITOR) 40 MG tablet Take 40 mg by mouth at bedtime.     . cloNIDine (CATAPRES) 0.1 MG tablet Take 2 tablets (0.2 mg total) by mouth at bedtime. 60 tablet 5  . Coenzyme Q10 (COQ10 PO) Take by mouth.    . folic acid (FOLVITE) 1 MG tablet Take 1 mg by mouth daily.    . furosemide (LASIX) 40 MG tablet Take 40 mg by mouth daily.    Marland Kitchen glimepiride (AMARYL) 4 MG tablet Take 6 mg by mouth daily with breakfast.    . glucose blood (ONE TOUCH ULTRA TEST) test strip TEST 1-2 TIMES PER DAY    . hydrALAZINE (APRESOLINE) 100 MG tablet Take 100 mg by mouth 3 (three) times daily.    . irbesartan (AVAPRO) 300 MG tablet Take 1 tablet (300 mg total) by mouth  every morning. 30 tablet 5  . Lancets (ONETOUCH ULTRASOFT) lancets USE TO TEST BLOOD SUGAR 1-2 TIMES DAILY    . pioglitazone (ACTOS) 15 MG tablet Take 15 mg by mouth daily.    . TRADJENTA 5 MG TABS tablet Take 5 mg by mouth daily.     . vitamin E 100 UNIT capsule  Take by mouth.    . Potassium Chloride ER 20 MEQ TBCR Take 40 mEq by mouth 2 (two) times daily. (Patient not taking: Reported on 06/19/2017) 120 tablet 5   No current facility-administered medications for this visit.     LABS/IMAGING: No results found for this or any previous visit (from the past 48 hour(s)). No results found.  VITALS: BP 138/70 (BP Location: Right Arm, Patient Position: Sitting, Cuff Size: Normal)   Pulse 70   Ht 5\' 11"  (1.803 m)   Wt 193 lb (87.5 kg)   BMI 26.92 kg/m   EXAM: General appearance: alert, no distress and moderately obese Neck: JVD - 2 cm above sternal notch, no carotid bruit and thyroid not enlarged, symmetric, no tenderness/mass/nodules Lungs: clear to auscultation bilaterally Heart: regular rate and rhythm, S1, S2 normal and systolic murmur: systolic ejection 3/6, crescendo at 2nd right intercostal space Abdomen: soft, non-tender; bowel sounds normal; no masses,  no organomegaly Extremities: edema trace bilateral edema Pulses: 2+ and symmetric Skin: Skin color, texture, turgor normal. No rashes or lesions Neurologic: Grossly normal Psych: Pleasant  EKG: Normal sinus rhythm at 70, nonspecific ST and T wave changes-personally reviewed  ASSESSMENT: 1. Progressive fatigue and dyspnea, question significant aortic stenosis versus progressive coronary artery disease 2. Acute diastolic congestive heart failure 3. AKI on CKD2 4. Mild aortic stenosis 5. Coronary artery disease status post CABG in 1987 6. Hypertension 7. Hyperlipidemia 8. Diabetes type 2 9. Mild PAD - intermittent claudication, not lifestyle limiting 10. Possible rheumatoid arthritis  PLAN: 1.   [name] reports  progressive fatigue and dyspnea on exertion over the past several months with decrease in ambulation.  He had a marked response to diuretics with weight down to 182, however weight is back up to 193 as his diuretics have been further decreased by his nephrologist.  Creatinine was up to 2.7 and has come down to 2.3.  In addition, recent lab work shows cholesterol which is not at goal with LDL 148.  He reports compliance with atorvastatin 40 mg daily.  His most pressing complaint right now is progressive fatigue and dyspnea, which could be worsening coronary artery disease given the age of his bypass grafts which are over 22 years old.  I do not suspect he has severe left ear, but I think it is worse than mild.  He would likely benefit from a left and right heart catheterization to further assess this and measure filling pressures as well as pressure across the aortic valve.  Although his creatinine is elevated, I feel that we could safely do this with pre-hydration.  I would recommend admission the evening prior to the procedure for hydration.  We will minimize contrast dye use.  We discussed the risks, benefits and alternatives to this procedure including the increased risk for worsening chronic kidney disease and the possibility of short-term or long-term dialysis.  He understands this risk and is willing to proceed with heart catheterization.  We will also need to discuss the addition of a PCSK9 inhibitor at some point in the near future, given his poorly controlled LDL cholesterol.  Follow-up with me after his cath.  Pixie Casino, MD, [facility], [place] Director of the [facility]  [facility] Attending Cardiologist  Direct Dial: [phone]  Fax: [phone]  Website:  [email or web address] 06/19/2017, 9:47 AM

## 2017-06-19 NOTE — Patient Instructions (Addendum)
   [facility] [address] [place] [address] Dept: [phone] Loc: [place]  06/19/2017  You are scheduled for a RIGHT & LEFT Cardiac Catheterization on Thursday, April 25 with Dr. Lauree Chandler.  1. Please arrive at the [facility] (Main Entrance A) at [facility]: [address] [place], [place] [address]. Free valet parking service is available.   Special note: Every effort is made to have your procedure done on time. Please understand that emergencies sometimes delay scheduled procedures.  You will need to be pre-admitted the evening prior to your heart cath for hydration. The hospital will call you to let you know when a bed is available tomorrow (Wednesday) afternoon/evening  2. Diet: Do not eat or drink anything after midnight prior to your procedure except sips of water to take medications. You will be given any specific or additional instructions about this while in the hospital.  3. Labs: You will have lab work done TODAY June 19, 2017 (BMET, CBC, PT-INR)  4. Medication instructions in preparation for your procedure:  HOLD Tradjenta, Actos, Glimeperide the morning of your procedure  On the morning of your procedure, take Aspirin 81mg  and any morning medicines NOT listed above.  You may use sips of water.  5. Plan for one night stay--bring personal belongings. 6. Bring a current list of your medications and current insurance cards. 7. You MUST have a responsible person to drive you home. 8. Someone MUST be with you the first 24 hours after you arrive home or your discharge will be delayed. 9. Please wear clothes that are easy to get on and off and wear slip-on shoes.  Thank you for allowing [place] to care for you!   -- [facility]   Additional Instructions -- START aspirin 81mg  once daily -- please schedule  follow up with Dr. Debara Pickett or PA/NP about 2-3 weeks after your heart cath

## 2017-06-19 NOTE — Progress Notes (Signed)
OFFICE NOTE  Chief Complaint:  Follow-up CHF, fatigue, decreased exercise tolerance  Primary Care Physician: Jani Gravel, MD  HPI:  [name] is a 84 year old with a history of coronary artery disease status post CABG in 1987, hypertension, dyslipidemia, diabetes, and peripheral arterial disease which is mild. Over the past year he has done well from a cardiac standpoint. He does have a history of prostate cancer and underwent radiation seed implant for that. He has also been having problems with confusion and brain fog and his medications were adjusted, including holding his statin, which did not cause much improvement. This may be some early dementia. In addition, he has abnormal lower extremity Dopplers, which I repeated. This has actually shown mild improvement in his ABIs with ABI 1.1 on the right and ABI of 0.92 on the left. The bilateral posterior tibials were occluded and there was 50% to 69% reduction in left common iliac and about a 0 to 49% reduction in the right superficial femoral. Despite this, he has no symptoms of claudication. He also denies any chest pain. He has had some shortness of breath and this, however, seems to be related to this episode over the past several months where he reported initially he was thought stung by a bee, developed swelling on the left side of his face and then subsequently developed extreme weakness and there as concern of a rheumatoid arthritis. His symptoms have improved with a steroid burst and tapered. He is currently on prednisone as well as methotrexate. He has also had recent dizziness. He reports head and sinus congestion, which may be contributing to his symptoms. However some of the dizziness is associated with walking up a hill at the end of exercise or walking down hills particularly. Is not associated with change in position or quick head movements.  There is some associated fatigue and chest fullness with exercise and improves with  rest, reminiscent of possible angina.  He underwent bilateral carotid Dopplers which showed a mild amount of plaque. A nuclear stress test performed which demonstrated no reversible ischemia and preserved ejection fraction. He still reports he has some mild dizziness and does fatigue a little at the beginning of exercise but improves when he continues to exercise.  He is generally without complaints today. He reports that recently blood work indicated that his kidney function was worsening and that he is now scheduled to see a nephrologist next Monday. I do not yet have those results in front of me. His metformin was discontinued due to this but he remains on lisinopril and that may need to be adjusted. He denies any specific anginal symptoms. He has some very mild claudication symptoms but it improves with walking. He continues to have a systolic murmur best heard over the aortic area and does have a history of aortic sclerosis in the past.  Mr. Clayson returns today for follow-up. Unfortunately says he hasn't taken his medicine today. He is supposed to take hydralazine 100 mg 3 times a day and for some reason has not taken 2 doses this morning. Blood pressure was very high at 196/86 but he denied any chest pain or headache. He says he going to take his medicine when he gets home. Is also on Flomax but no other blood pressure medicines. This is probably due to worsening renal function. Currently his creatinine is around 3 and is followed by Dr. Mercy Moore. Is also on Lipitor and cholesterol was recently checked by Dr. Wilson Singer.  10/07/2015  Mr. Damron returns today for follow-up. Over the past year he's had no new complaints although his blood pressures been difficult to control. He's followed by Dr. Mercy Moore. He was previously on amlodipine but that's been discontinued. He is now on hydralazine, clonidine and valsartan. He did not take his medicines today and his blood pressure is elevated. He says he  takes the clonidine once or twice per day. He tries to void in the morning because it causes extreme fatigue. We had previously discussed the Catapres patch however cost was limiting. He also reports recently some worsening memory.  10/16/2016  Mr. Fecher returns for follow-up. Many years since I last saw him. He's called in for some chest pain which she said got better. His last stress test was in 2016 which was negative for ischemia. He was noted to have some aortic sclerosis in 2015 however last year was noted to have a louder systolic murmur which sounds more like aortic stenosis. Blood pressures not been well controlled. Today's elevated again 160/70. He says his blood pressure medicines make him feel tired. He told me that he takes all for his blood pressure medicines at night. When asked further about his hydralazine which is supposed to be taken 3 times a day he says he does not do that. He then reported that his blood pressure typically runs higher in the morning and throughout the day. He currently denies any chest pain. He reports some left lower strandy swelling but also has numbness and tingling has been undergoing and back injections.  10/27/2016  Mr. Loewen had adjustments on his medications. He is now taking a hydralazine 3 times a day, irbesartan in the morning and clonidine at night. I reviewed his blood pressures which tend to range in the 140s in the morning before taking medication, in the 120s in the afternoon after medication and typically in the 120s over 80s at night. This indicates adequate treatment of his blood pressure. He did undergo an echocardiogram for aortic stenosis. This demonstrated a normal LVEF of 60-65% with mild aortic stenosis and a mean gradient of 11 mmHg. He understands the implications of this and that we will continue to follow his aortic valve disease.  02/28/2017  Mr. Lunde presents today with significant shortness of breath, weight gain and leg edema  as well as increasing abdominal girth over the past 2-3 weeks.  The symptoms came on gradually but clinically worsened.  Weight in August was 185 pounds, he now weighs 204 pounds.  Reports worsening shortness of breath, orthopnea and PND.  Blood pressure was also elevated today 183/75 and recently has been difficult to control.  His primary care provider recommended increasing his clonidine to 0.1 mg 3 times daily, however he has had significant daytime fatigue with this.  03/16/2017  Mr. Blanchette returns today for follow-up of heart failure.  His weight back in August was 185 pounds and he gained up to 204 pounds.  When I last saw him I doubled his diuretic and increased his blood pressure medication.  Over the past 2 weeks he has diuresed back down to 182 pounds.  He feels markedly better.  Blood pressure is better controlled today 132/58.  He feels like his energy level has improved.  We obtain blood work when he was in congestive heart failure which showed an elevated creatinine over 2 and a baseline around 1.6.  He was hypokalemic and I increased his potassium.  His echo showed an EF of 65-70% with grade  2 diastolic dysfunction and high LV filling pressures.  06/19/2017  [name] was seen today in follow-up.  I last saw him in January, at which time he was complaining of shortness of breath and edema.  Weight was up to 204 pounds.  He was given diuretics and his weight has come down significantly.  In fact, his creatinine rose up to 2.7 and weight was down to 182.  He saw his nephrologist, Dr. Hollie Salk, recently who decreased his Lasix further and started amlodipine 10 mg daily.  He now says over the past several months he has had progressive fatigue, decreased exercise tolerance and worsening shortness of breath.  He wonders if it is related to his aortic valve.  In January was noted to have normal systolic function and mild aortic stenosis.  On exam, he has what sounds like moderate left ear with an  audible second heart sound.  He again looks volume overloaded today and weight is back up to 193 pounds, up from 182 pounds.  He denies any chest pain.  PMHx:  Past Medical History:  Diagnosis Date  . Coronary artery disease   . DM (diabetes mellitus) ([facility])   . Dyslipidemia   . Hypertension   . Peripheral artery disease ([facility])    mild    Past Surgical History:  Procedure Laterality Date  . CORONARY ARTERY BYPASS GRAFT  03/06/85   LAD and first diagonal/circumflex (sequential saphenous vein graft,cardioplegia  . NM MYOCAR PERF WALL MOTION  09/12/2006   no ischemia  . US ECHOCARDIOGRAPHY  11/04/2008   trace TR & MR    FAMHx:  Family History  Problem Relation Age of Onset  . Heart attack Mother     SOCHx:   reports that he quit smoking about 44 years ago. His smoking use included cigarettes. He has never used smokeless tobacco. He reports that he drinks alcohol. He reports that he does not use drugs.  ALLERGIES:  No Known Allergies  ROS: Pertinent items noted in HPI and remainder of comprehensive ROS otherwise negative.  HOME MEDS: Current Outpatient Medications  Medication Sig Dispense Refill  . amLODipine (NORVASC) 10 MG tablet Take 10 mg by mouth at bedtime.    Marland Kitchen aspirin EC 81 MG tablet Take 81 mg by mouth daily.    Marland Kitchen atorvastatin (LIPITOR) 40 MG tablet Take 40 mg by mouth at bedtime.     . cloNIDine (CATAPRES) 0.1 MG tablet Take 2 tablets (0.2 mg total) by mouth at bedtime. 60 tablet 5  . Coenzyme Q10 (COQ10 PO) Take by mouth.    . folic acid (FOLVITE) 1 MG tablet Take 1 mg by mouth daily.    . furosemide (LASIX) 40 MG tablet Take 40 mg by mouth daily.    Marland Kitchen glimepiride (AMARYL) 4 MG tablet Take 6 mg by mouth daily with breakfast.    . glucose blood (ONE TOUCH ULTRA TEST) test strip TEST 1-2 TIMES PER DAY    . hydrALAZINE (APRESOLINE) 100 MG tablet Take 100 mg by mouth 3 (three) times daily.    . irbesartan (AVAPRO) 300 MG tablet Take 1 tablet (300 mg total) by mouth  every morning. 30 tablet 5  . Lancets (ONETOUCH ULTRASOFT) lancets USE TO TEST BLOOD SUGAR 1-2 TIMES DAILY    . pioglitazone (ACTOS) 15 MG tablet Take 15 mg by mouth daily.    . TRADJENTA 5 MG TABS tablet Take 5 mg by mouth daily.     . vitamin E 100 UNIT capsule  Take by mouth.    . Potassium Chloride ER 20 MEQ TBCR Take 40 mEq by mouth 2 (two) times daily. (Patient not taking: Reported on 06/19/2017) 120 tablet 5   No current facility-administered medications for this visit.     LABS/IMAGING: No results found for this or any previous visit (from the past 48 hour(s)). No results found.  VITALS: BP 138/70 (BP Location: Right Arm, Patient Position: Sitting, Cuff Size: Normal)   Pulse 70   Ht 5\' 11"  (1.803 m)   Wt 193 lb (87.5 kg)   BMI 26.92 kg/m   EXAM: General appearance: alert, no distress and moderately obese Neck: JVD - 2 cm above sternal notch, no carotid bruit and thyroid not enlarged, symmetric, no tenderness/mass/nodules Lungs: clear to auscultation bilaterally Heart: regular rate and rhythm, S1, S2 normal and systolic murmur: systolic ejection 3/6, crescendo at 2nd right intercostal space Abdomen: soft, non-tender; bowel sounds normal; no masses,  no organomegaly Extremities: edema trace bilateral edema Pulses: 2+ and symmetric Skin: Skin color, texture, turgor normal. No rashes or lesions Neurologic: Grossly normal Psych: Pleasant  EKG: Normal sinus rhythm at 70, nonspecific ST and T wave changes-personally reviewed  ASSESSMENT: 1. Progressive fatigue and dyspnea, question significant aortic stenosis versus progressive coronary artery disease 2. Acute diastolic congestive heart failure 3. AKI on CKD2 4. Mild aortic stenosis 5. Coronary artery disease status post CABG in 1987 6. Hypertension 7. Hyperlipidemia 8. Diabetes type 2 9. Mild PAD - intermittent claudication, not lifestyle limiting 10. Possible rheumatoid arthritis  PLAN: 1.   Mr. Grattan reports  progressive fatigue and dyspnea on exertion over the past several months with decrease in ambulation.  He had a marked response to diuretics with weight down to 182, however weight is back up to 193 as his diuretics have been further decreased by his nephrologist.  Creatinine was up to 2.7 and has come down to 2.3.  In addition, recent lab work shows cholesterol which is not at goal with LDL 148.  He reports compliance with atorvastatin 40 mg daily.  His most pressing complaint right now is progressive fatigue and dyspnea, which could be worsening coronary artery disease given the age of his bypass grafts which are over 45 years old.  I do not suspect he has severe left ear, but I think it is worse than mild.  He would likely benefit from a left and right heart catheterization to further assess this and measure filling pressures as well as pressure across the aortic valve.  Although his creatinine is elevated, I feel that we could safely do this with pre-hydration.  I would recommend admission the evening prior to the procedure for hydration.  We will minimize contrast dye use.  We discussed the risks, benefits and alternatives to this procedure including the increased risk for worsening chronic kidney disease and the possibility of short-term or long-term dialysis.  He understands this risk and is willing to proceed with heart catheterization.  We will also need to discuss the addition of a PCSK9 inhibitor at some point in the near future, given his poorly controlled LDL cholesterol.  Follow-up with me after his cath.  Pixie Casino, MD, [facility], [place] Director of the [facility]  [facility] Attending Cardiologist  Direct Dial: [phone]  Fax: [phone]  Website:  [email or web address] 06/19/2017, 9:47 AM

## 2017-06-20 ENCOUNTER — Encounter (HOSPITAL_COMMUNITY): Payer: Self-pay | Admitting: General Practice

## 2017-06-20 ENCOUNTER — Telehealth: Payer: Self-pay | Admitting: Internal Medicine

## 2017-06-20 ENCOUNTER — Observation Stay (HOSPITAL_COMMUNITY)
Admission: RE | Admit: 2017-06-20 | Discharge: 2017-06-21 | Disposition: A | Discharge status: Home / Self Care | Payer: Medicare Other | Source: Ambulatory Visit | Attending: Cardiovascular Disease | Admitting: Cardiovascular Disease

## 2017-06-20 ENCOUNTER — Other Ambulatory Visit: Payer: Self-pay

## 2017-06-20 ENCOUNTER — Observation Stay (HOSPITAL_COMMUNITY): Payer: Medicare Other

## 2017-06-20 DIAGNOSIS — N179 Acute kidney failure, unspecified: Secondary | ICD-10-CM | POA: Diagnosis not present

## 2017-06-20 DIAGNOSIS — I25810 Atherosclerosis of coronary artery bypass graft(s) without angina pectoris: Principal | ICD-10-CM | POA: Diagnosis present

## 2017-06-20 DIAGNOSIS — I350 Nonrheumatic aortic (valve) stenosis: Secondary | ICD-10-CM | POA: Diagnosis not present

## 2017-06-20 DIAGNOSIS — Z8249 Family history of ischemic heart disease and other diseases of the circulatory system: Secondary | ICD-10-CM | POA: Insufficient documentation

## 2017-06-20 DIAGNOSIS — E876 Hypokalemia: Secondary | ICD-10-CM | POA: Diagnosis not present

## 2017-06-20 DIAGNOSIS — Z7982 Long term (current) use of aspirin: Secondary | ICD-10-CM | POA: Diagnosis not present

## 2017-06-20 DIAGNOSIS — N184 Chronic kidney disease, stage 4 (severe): Secondary | ICD-10-CM | POA: Diagnosis not present

## 2017-06-20 DIAGNOSIS — I2582 Chronic total occlusion of coronary artery: Secondary | ICD-10-CM | POA: Diagnosis not present

## 2017-06-20 DIAGNOSIS — E1151 Type 2 diabetes mellitus with diabetic peripheral angiopathy without gangrene: Secondary | ICD-10-CM | POA: Insufficient documentation

## 2017-06-20 DIAGNOSIS — Y838 Other surgical procedures as the cause of abnormal reaction of the patient, or of later complication, without mention of misadventure at the time of the procedure: Secondary | ICD-10-CM | POA: Insufficient documentation

## 2017-06-20 DIAGNOSIS — N189 Chronic kidney disease, unspecified: Secondary | ICD-10-CM | POA: Diagnosis present

## 2017-06-20 DIAGNOSIS — R0602 Shortness of breath: Secondary | ICD-10-CM | POA: Diagnosis not present

## 2017-06-20 DIAGNOSIS — I130 Hypertensive heart and chronic kidney disease with heart failure and stage 1 through stage 4 chronic kidney disease, or unspecified chronic kidney disease: Secondary | ICD-10-CM | POA: Diagnosis not present

## 2017-06-20 DIAGNOSIS — M1990 Unspecified osteoarthritis, unspecified site: Secondary | ICD-10-CM | POA: Insufficient documentation

## 2017-06-20 DIAGNOSIS — Z87891 Personal history of nicotine dependence: Secondary | ICD-10-CM | POA: Diagnosis not present

## 2017-06-20 DIAGNOSIS — I2584 Coronary atherosclerosis due to calcified coronary lesion: Secondary | ICD-10-CM | POA: Diagnosis not present

## 2017-06-20 DIAGNOSIS — I5033 Acute on chronic diastolic (congestive) heart failure: Secondary | ICD-10-CM | POA: Insufficient documentation

## 2017-06-20 DIAGNOSIS — I97630 Postprocedural hematoma of a circulatory system organ or structure following a cardiac catheterization: Secondary | ICD-10-CM | POA: Diagnosis not present

## 2017-06-20 DIAGNOSIS — E785 Hyperlipidemia, unspecified: Secondary | ICD-10-CM | POA: Diagnosis not present

## 2017-06-20 DIAGNOSIS — I2510 Atherosclerotic heart disease of native coronary artery without angina pectoris: Secondary | ICD-10-CM | POA: Diagnosis present

## 2017-06-20 DIAGNOSIS — E1122 Type 2 diabetes mellitus with diabetic chronic kidney disease: Secondary | ICD-10-CM | POA: Insufficient documentation

## 2017-06-20 DIAGNOSIS — I272 Pulmonary hypertension, unspecified: Secondary | ICD-10-CM | POA: Diagnosis not present

## 2017-06-20 DIAGNOSIS — E119 Type 2 diabetes mellitus without complications: Secondary | ICD-10-CM

## 2017-06-20 DIAGNOSIS — I25118 Atherosclerotic heart disease of native coronary artery with other forms of angina pectoris: Secondary | ICD-10-CM | POA: Diagnosis not present

## 2017-06-20 HISTORY — DX: Pneumonia, unspecified organism: J18.9

## 2017-06-20 HISTORY — DX: Malignant neoplasm of prostate: C61

## 2017-06-20 HISTORY — DX: Type 2 diabetes mellitus without complications: E11.9

## 2017-06-20 HISTORY — DX: Unspecified osteoarthritis, unspecified site: M19.90

## 2017-06-20 LAB — BASIC METABOLIC PANEL
BUN/Creatinine Ratio: 13 (ref 10–24)
BUN: 27 mg/dL (ref 8–27)
CO2: 29 mmol/L (ref 20–29)
Calcium: 9 mg/dL (ref 8.6–10.2)
Chloride: 99 mmol/L (ref 96–106)
Creatinine, Ser: 2.07 mg/dL — ABNORMAL HIGH (ref 0.76–1.27)
GFR calc Af Amer: 34 mL/min/{1.73_m2} — ABNORMAL LOW (ref >59)
GFR calc non Af Amer: 29 mL/min/{1.73_m2} — ABNORMAL LOW (ref >59)
Glucose: 314 mg/dL — ABNORMAL HIGH (ref 65–99)
Potassium: 3 mmol/L — ABNORMAL LOW (ref 3.5–5.2)
Sodium: 144 mmol/L (ref 134–144)

## 2017-06-20 LAB — CBC
Hematocrit: 38.1 % (ref 37.5–51.0)
Hemoglobin: 13.2 g/dL (ref 13.0–17.7)
MCH: 32 pg (ref 26.6–33.0)
MCHC: 34.6 g/dL (ref 31.5–35.7)
MCV: 93 fL (ref 79–97)
Platelets: 201 10*3/uL (ref 150–379)
RBC: 4.12 x10E6/uL — ABNORMAL LOW (ref 4.14–5.80)
RDW: 14.1 % (ref 12.3–15.4)
WBC: 9.3 10*3/uL (ref 3.4–10.8)

## 2017-06-20 LAB — GLUCOSE, CAPILLARY
Glucose-Capillary: 299 mg/dL — ABNORMAL HIGH (ref 65–99)
Glucose-Capillary: 259 mg/dL — ABNORMAL HIGH (ref 65–99)

## 2017-06-20 LAB — PROTIME-INR
INR: 1 (ref 0.8–1.2)
Prothrombin Time: 10.5 s (ref 9.1–12.0)

## 2017-06-20 MED ORDER — HYDRALAZINE HCL 50 MG PO TABS
100.0000 mg | ORAL_TABLET | Freq: Three times a day (TID) | ORAL | Status: DC
  Administered 2017-06-20 – 2017-06-21 (×3): 100 mg via ORAL
  Filled 2017-06-20 (×3): qty 2

## 2017-06-20 MED ORDER — ACETAMINOPHEN 325 MG PO TABS: 650.0000 mg | ORAL_TABLET | ORAL | Status: DC | PRN

## 2017-06-20 MED ORDER — ASPIRIN 81 MG PO CHEW
81.0000 mg | CHEWABLE_TABLET | ORAL | Status: AC
  Administered 2017-06-21: 81 mg via ORAL
  Filled 2017-06-20: qty 1

## 2017-06-20 MED ORDER — SODIUM CHLORIDE 0.9 % IV SOLN: INTRAVENOUS | Status: DC

## 2017-06-20 MED ORDER — AMLODIPINE BESYLATE 10 MG PO TABS
10.0000 mg | ORAL_TABLET | Freq: Every day | ORAL | Status: DC
  Administered 2017-06-20: 10 mg via ORAL
  Filled 2017-06-20: qty 1

## 2017-06-20 MED ORDER — SODIUM CHLORIDE 0.9 % IV SOLN: 250.0000 mL | INTRAVENOUS | Status: DC | PRN

## 2017-06-20 MED ORDER — ATORVASTATIN CALCIUM 40 MG PO TABS
40.0000 mg | ORAL_TABLET | Freq: Every day | ORAL | Status: DC
  Administered 2017-06-20: 40 mg via ORAL
  Filled 2017-06-20: qty 1

## 2017-06-20 MED ORDER — CLONIDINE HCL 0.2 MG PO TABS
0.2000 mg | ORAL_TABLET | Freq: Three times a day (TID) | ORAL | Status: DC
  Administered 2017-06-20 – 2017-06-21 (×2): 0.2 mg via ORAL
  Filled 2017-06-20 (×2): qty 1

## 2017-06-20 MED ORDER — INSULIN ASPART 100 UNIT/ML ~~LOC~~ SOLN
0.0000 [IU] | Freq: Three times a day (TID) | SUBCUTANEOUS | Status: DC
  Administered 2017-06-21: 5 [IU] via SUBCUTANEOUS

## 2017-06-20 MED ORDER — INSULIN ASPART 100 UNIT/ML ~~LOC~~ SOLN
0.0000 [IU] | Freq: Every day | SUBCUTANEOUS | Status: DC
  Administered 2017-06-20: 3 [IU] via SUBCUTANEOUS

## 2017-06-20 MED ORDER — SODIUM CHLORIDE 0.9% FLUSH: 3.0000 mL | INTRAVENOUS | Status: DC | PRN

## 2017-06-20 MED ORDER — ASPIRIN EC 81 MG PO TBEC: 81.0000 mg | DELAYED_RELEASE_TABLET | Freq: Every day | ORAL | Status: DC

## 2017-06-20 MED ORDER — SODIUM CHLORIDE 0.9% FLUSH
3.0000 mL | Freq: Two times a day (BID) | INTRAVENOUS | Status: DC
  Administered 2017-06-20: 3 mL via INTRAVENOUS

## 2017-06-20 MED ORDER — SODIUM CHLORIDE 0.9% FLUSH: 3.0000 mL | Freq: Two times a day (BID) | INTRAVENOUS | Status: DC

## 2017-06-20 MED ORDER — POTASSIUM CHLORIDE CRYS ER 20 MEQ PO TBCR
40.0000 meq | EXTENDED_RELEASE_TABLET | Freq: Once | ORAL | Status: AC
  Administered 2017-06-20: 40 meq via ORAL
  Filled 2017-06-20: qty 2

## 2017-06-20 MED ORDER — SODIUM CHLORIDE 0.9 % IV SOLN
INTRAVENOUS | Status: DC
  Administered 2017-06-20: 22:00:00 via INTRAVENOUS

## 2017-06-20 MED ORDER — HEPARIN SODIUM (PORCINE) 5000 UNIT/ML IJ SOLN
5000.0000 [IU] | Freq: Three times a day (TID) | INTRAMUSCULAR | Status: DC
  Administered 2017-06-20 – 2017-06-21 (×2): 5000 [IU] via SUBCUTANEOUS
  Filled 2017-06-20 (×2): qty 1

## 2017-06-20 MED ORDER — ONDANSETRON HCL 4 MG/2ML IJ SOLN: 4.0000 mg | Freq: Four times a day (QID) | INTRAMUSCULAR | Status: DC | PRN

## 2017-06-20 MED ORDER — FOLIC ACID 1 MG PO TABS
1.0000 mg | ORAL_TABLET | Freq: Every day | ORAL | Status: DC
  Administered 2017-06-21: 1 mg via ORAL
  Filled 2017-06-20: qty 1

## 2017-06-20 NOTE — Progress Notes (Signed)
Paged on call PA to inform of pt arrival to unit

## 2017-06-20 NOTE — Progress Notes (Signed)
PA cardiology aware of pt on unit, will place orders

## 2017-06-20 NOTE — Telephone Encounter (Signed)
Received records from [place] on 06/20/17, Appt 07/12/17 @ 8:00am. [place]

## 2017-06-20 NOTE — H&P (Signed)
Cardiology Admission History and Physical:   Patient ID: BRISTOL SOY; MRN: [id number]; DOB: 01-20-1934   Admission date: 06/20/2017  Primary Care Provider: Jani Gravel, MD Primary Cardiologist: Pixie Casino, MD  Primary Electrophysiologist:  None  Chief Complaint:  SOB and fatigue  Patient Profile:   [name] is a 84 y.o. male with a history of CAD s/p CABG 1987, HTN, HLD, chronic diastolic CHF, mild AS, DM type 2, CKD stage 4, and PVD who presented for L/R cardiac catheterization.  History of Present Illness:   [name] was seen outpatient by Dr. Debara Pickett 06/19/17 without complaints of DOE and LE edema. He has had progressive fatigue, decreased exercise tolerance, and worsening SOB for the past several months. He reports one episode of chest pain last week which lasted for minutes but was reminiscent of pain he experienced prior to his CABG. This pain resolved spontaneously and has not reoccurred. He also reports significant weight gain and is noted to be up 11lbs from presumed dry weight of 182lbs. At office visit yesterday, he was noted to be volume overloaded and Dr. Debara Pickett concerned for possible progression of AS. Patient was instructed to present to the hospital on the evening prior to his procedure for hydration.   On arrival, BP elevated to 190s/80s, otherwise VSS. Labs yesterday notable for K 3.0, Cr 2.07, Hgb 13.2, PLT 201, INR 1.0. EKG with sinus rhythm with non-specific ST-T wave abnormalities.    Past Medical History:  Diagnosis Date  . Arthritis    "a little in LLE" (06/20/2017)  . CKD (chronic kidney disease), stage II    AKI on CKD2/notes 06/20/2017  . Coronary artery disease   . Dyslipidemia   . Hypertension   . Peripheral artery disease (HCC)    mild  . Pneumonia ~ 1950 X 1  . Prostate cancer ([place]) ~ 2015  . Type II diabetes mellitus ([place])     Past Surgical History:  Procedure Laterality Date  . CARDIAC CATHETERIZATION     "a couple times"  (06/20/2017)  . CATARACT EXTRACTION W/ INTRAOCULAR LENS  IMPLANT, BILATERAL Bilateral   . CORONARY ARTERY BYPASS GRAFT  03/06/1985   LAD and first diagonal/circumflex (sequential saphenous vein graft,cardioplegia  . INSERTION PROSTATE RADIATION SEED  ~ 2015  . NM MYOCAR PERF WALL MOTION  09/12/2006   no ischemia  . US ECHOCARDIOGRAPHY  11/04/2008   trace TR & MR     Medications Prior to Admission: Prior to Admission medications   Medication Sig Start Date End Date Taking? Authorizing Provider  amLODipine (NORVASC) 10 MG tablet Take 10 mg by mouth at bedtime.   Yes [provider]  aspirin EC 81 MG tablet Take 81 mg by mouth daily.   Yes [provider]  atorvastatin (LIPITOR) 40 MG tablet Take 40 mg by mouth at bedtime.  08/09/12  Yes [provider]  cloNIDine (CATAPRES) 0.2 MG tablet Take 0.2 mg by mouth 3 (three) times daily.   Yes [provider]  Coenzyme Q10 (COQ10) 200 MG CAPS Take 1 capsule by mouth daily.    Yes [provider]  folic acid (FOLVITE) 1 MG tablet Take 1 mg by mouth daily.   Yes [provider]  furosemide (LASIX) 40 MG tablet Take 40 mg by mouth daily.   Yes [provider]  glimepiride (AMARYL) 4 MG tablet Take 6 mg by mouth daily with breakfast.   Yes [provider]  hydrALAZINE (APRESOLINE) 100 MG tablet  Take 100 mg by mouth 3 (three) times daily.   Yes [provider]  irbesartan (AVAPRO) 300 MG tablet Take 1 tablet (300 mg total) by mouth every morning. 02/28/17  Yes Hilty, Nadean Corwin, MD  Multiple Vitamin (MULTIVITAMIN) tablet Take 3 tablets by mouth daily.   Yes [provider]  OVER THE COUNTER MEDICATION Place 1 spray into the nose daily as needed. Sovereign Silver Nasal Spray for Immune Support   Yes [provider]  TRADJENTA 5 MG TABS tablet Take 5 mg by mouth every morning.  02/27/17  Yes [provider]  vitamin E 400 UNIT capsule Take 400 Units by mouth  daily.    Yes [provider]  glucose blood (ONE TOUCH ULTRA TEST) test strip TEST 1-2 TIMES PER DAY 12/22/15   [provider]  Lancets (ONETOUCH ULTRASOFT) lancets USE TO TEST BLOOD SUGAR 1-2 TIMES DAILY 12/22/15   [provider]     Allergies:   No Known Allergies  Social History:   Social History   Socioeconomic History  . Marital status: Married    Spouse name: Not on file  . Number of children: Not on file  . Years of education: Not on file  . Highest education level: Not on file  Occupational History  . Not on file  Social Needs  . Financial resource strain: Not on file  . Food insecurity:    Worry: Not on file    Inability: Not on file  . Transportation needs:    Medical: Not on file    Non-medical: Not on file  Tobacco Use  . Smoking status: Former Smoker    Packs/day: 1.00    Years: 20.00    Pack years: 20.00    Types: Cigarettes    Last attempt to quit: 08/22/1972    Years since quitting: 44.8  . Smokeless tobacco: Never Used  Substance and Sexual Activity  . Alcohol use: Not Currently    Comment: 06/20/2017 "nothing since ~ 1974"  . Drug use: No  . Sexual activity: Not Currently  Lifestyle  . Physical activity:    Days per week: Not on file    Minutes per session: Not on file  . Stress: Not on file  Relationships  . Social connections:    Talks on phone: Not on file    Gets together: Not on file    Attends religious service: Not on file    Active member of club or organization: Not on file    Attends meetings of clubs or organizations: Not on file    Relationship status: Not on file  . Intimate partner violence:    Fear of current or ex partner: Not on file    Emotionally abused: Not on file    Physically abused: Not on file    Forced sexual activity: Not on file  Other Topics Concern  . Not on file  Social History Narrative  . Not on file    Family History:   The patient's family history includes Heart attack in  his mother.    ROS:  Please see the history of present illness.  All other ROS reviewed and negative.     Physical Exam/Data:   Vitals:   06/20/17 1829 06/20/17 1837 06/20/17 1924  BP: (!) 197/68 (!) 186/95 (!) 196/81  Pulse: 70 69 69  Resp: 18  16  Temp:   98.2 F (36.8 C)  TempSrc: Oral  Oral  SpO2: 100%  99%  Weight: 192 lb 4.8 oz (87.2 kg)    Height: 5\' 11"  (1.803 m)     No intake or output data in the 24 hours ending 06/20/17 1941 Filed Weights   06/20/17 1829  Weight: 192 lb 4.8 oz (87.2 kg)   Body mass index is 26.82 kg/m.  General:  Well nourished, well developed, sitting on the edge of his bed in no acute distress HEENT: sclera anicteric Neck: no JVD appreciated Endocrine:  No thryomegaly Vascular: No carotid bruits; distal pulses 2+ bilaterally  Cardiac:  normal S1, S2; RRR; +murmur RUSB, no gallops or rubs appreciated  Lungs:  clear to auscultation bilaterally, no wheezing, rhonchi or rales  Abd: soft, nontender, no hepatomegaly  Ext: 2-3+ b/l LE edema Musculoskeletal:  No deformities, BUE and BLE strength normal and equal Skin: warm and dry  Neuro:  CNs 2-12 intact, no focal abnormalities noted Psych:  Normal affect    EKG:  The ECG that was done 06/19/17 was personally reviewed and demonstrates sinus rhythm with non-specific ST-T wave abnormalities.   Relevant CV Studies: Echocardiogram 02/2017: Study Conclusions  - Left ventricle: The cavity size was normal. There was mild   concentric hypertrophy. Systolic function was vigorous. The   estimated ejection fraction was in the range of 65% to 70%. Wall   motion was normal; there were no regional wall motion   abnormalities. Features are consistent with a pseudonormal left   ventricular filling pattern, with concomitant abnormal relaxation   and increased filling pressure (grade 2 diastolic dysfunction).   Doppler parameters are consistent with high ventricular filling   pressure. - Aortic valve:  There was mild stenosis. There was no   regurgitation. Peak velocity (S): 269 cm/s. Mean gradient (S): 16   mm Hg. - Mitral valve: Transvalvular velocity was within the normal range.   There was no evidence for stenosis. There was mild regurgitation. - Left atrium: The atrium was moderately dilated. - Right ventricle: The cavity size was normal. Wall thickness was   normal. Systolic function was normal. - Atrial septum: No defect or patent foramen ovale was identified. - Tricuspid valve: There was mild regurgitation. Peak RV-RA   gradient (S): 47 mm Hg. - Pulmonary arteries: Systolic pressure was moderately increased.   PA peak pressure: 50 mm Hg (S). - Pericardium, extracardiac: A small pericardial effusion was   identified.  Laboratory Data:  Chemistry Recent Labs  Lab 06/19/17 0940  NA 144  K 3.0*  CL 99  CO2 29  GLUCOSE 314*  BUN 27  CREATININE 2.07*  CALCIUM 9.0  GFRNONAA 29*  GFRAA 34*    No results for input(s): PROT, ALBUMIN, AST, ALT, ALKPHOS, BILITOT in the last 168 hours. Hematology Recent Labs  Lab 06/19/17 0940  WBC 9.3  RBC 4.12*  HGB 13.2  HCT 38.1  MCV 93  MCH 32.0  MCHC 34.6  RDW 14.1  PLT 201   Cardiac EnzymesNo results for input(s): TROPONINI in the last 168 hours. No results for input(s): TROPIPOC in the last 168 hours.  BNPNo results for input(s): BNP, PROBNP in the last 168 hours.  DDimer No results for input(s): DDIMER in the last 168 hours.  Radiology/Studies:  No results found.  Assessment and Plan:   1. DOE and fatigue:  worsening over the past several months. Question progression of aortic stenosis verses CAD. Planned for [facility] tomorrow to further evaluate - NPO after MN tonight - Will pre-hydrate with gentle IVFs overnight - monitor closely for  signs of respiratory distress   2. Acute on chronic diastolic CHF: noted to have EF 65-70% with G2DD on last echo 02/2017. Lungs are clear on exam but he has had significant LE edema and  11lb weight gain. Lasix recently decreased from 40mg  BID to 40 mg daily by his nephrologist  - Lasix on hold in anticipation for cardiac cath tomorrow - Will monitor respiratory status closely with gentle hydration overnight - Would likely benefit from IV diuresis post-cath.   3. CAD s/p CABG 1987: last ischemic testing was an exercise stress test in 2016 which was considered low risk. He notes one episode of chest pain last week which was similar to pre-CABG CP. Currently CP free - Plan for [facility] tomorrow to evaluate for progression of disease - Continue ASA and statin  4. Aortic Stenosis: reported as mild on last echocardiogram 02/2017. Since that time patient has had progressively worsening SOB and fatigue concerning for progression of disease - Plan for [facility] tomorrow to evaluate for progression of disease - Will recheck Echocardiogram given symptoms worsened significantly since last Echo.  5. HTN: BP significantly elevated on presentation to the ED. Due for home amlodipine, clonidine, and hydralazine this evening.  - Will hold home irbesartan for cath tomorrow - Continue amlodipine, clonidine, and hydralazine  6. DM type 2:  - Cover with [facility] inpatient   7. CKD stage 4: Cr. 2.07 yesterday - Will pre-hydrate with NS at 75cc/hr overnight - Monitor Cr closely pre/post cath  8. Hypokalemia: K 3.0 on labs yesterday - Will replete with 53mEq po now and recheck BMP in AM   9. HLD: - Continue statin  Severity of Illness: The appropriate patient status for this patient is OBSERVATION. Observation status is judged to be reasonable and necessary in order to provide the required intensity of service to ensure the patient's safety. The patient's presenting symptoms, physical exam findings, and initial radiographic and laboratory data in the context of their medical condition is felt to place them at decreased risk for further clinical deterioration. Furthermore, it is anticipated that the patient  will be medically stable for discharge from the hospital within 2 midnights of admission. The following factors support the patient status of observation.   " The patient's presenting symptoms include SOB, LE swelling. " The physical exam findings include 2-3+ LE edema. " The initial radiographic and laboratory data are Cr 2.07, K 3.0. CXR pending     For questions or updates, please contact [place] Please consult [email or web address] for contact info under Cardiology/STEMI.    Signed, Abigail Butts, PA-C  06/20/2017 7:41 PM

## 2017-06-21 ENCOUNTER — Other Ambulatory Visit (HOSPITAL_COMMUNITY): Payer: Medicare Other

## 2017-06-21 ENCOUNTER — Encounter (HOSPITAL_COMMUNITY): Payer: Self-pay | Admitting: Cardiovascular Disease

## 2017-06-21 ENCOUNTER — Other Ambulatory Visit: Payer: Self-pay | Admitting: Cardiology

## 2017-06-21 ENCOUNTER — Ambulatory Visit (HOSPITAL_COMMUNITY)
Admission: RE | Disposition: A | Discharge status: Home / Self Care | Payer: Medicare Other | Source: Ambulatory Visit | Attending: Cardiovascular Disease

## 2017-06-21 DIAGNOSIS — E876 Hypokalemia: Secondary | ICD-10-CM | POA: Diagnosis not present

## 2017-06-21 DIAGNOSIS — Z7982 Long term (current) use of aspirin: Secondary | ICD-10-CM | POA: Diagnosis not present

## 2017-06-21 DIAGNOSIS — I272 Pulmonary hypertension, unspecified: Secondary | ICD-10-CM | POA: Diagnosis not present

## 2017-06-21 DIAGNOSIS — N289 Disorder of kidney and ureter, unspecified: Secondary | ICD-10-CM

## 2017-06-21 DIAGNOSIS — Z87891 Personal history of nicotine dependence: Secondary | ICD-10-CM | POA: Diagnosis not present

## 2017-06-21 DIAGNOSIS — I5033 Acute on chronic diastolic (congestive) heart failure: Secondary | ICD-10-CM | POA: Diagnosis not present

## 2017-06-21 DIAGNOSIS — I2584 Coronary atherosclerosis due to calcified coronary lesion: Secondary | ICD-10-CM | POA: Diagnosis not present

## 2017-06-21 DIAGNOSIS — I350 Nonrheumatic aortic (valve) stenosis: Secondary | ICD-10-CM | POA: Diagnosis not present

## 2017-06-21 DIAGNOSIS — E1122 Type 2 diabetes mellitus with diabetic chronic kidney disease: Secondary | ICD-10-CM | POA: Diagnosis not present

## 2017-06-21 DIAGNOSIS — I2510 Atherosclerotic heart disease of native coronary artery without angina pectoris: Secondary | ICD-10-CM

## 2017-06-21 DIAGNOSIS — N184 Chronic kidney disease, stage 4 (severe): Secondary | ICD-10-CM

## 2017-06-21 DIAGNOSIS — I25810 Atherosclerosis of coronary artery bypass graft(s) without angina pectoris: Secondary | ICD-10-CM

## 2017-06-21 DIAGNOSIS — I130 Hypertensive heart and chronic kidney disease with heart failure and stage 1 through stage 4 chronic kidney disease, or unspecified chronic kidney disease: Secondary | ICD-10-CM | POA: Diagnosis not present

## 2017-06-21 DIAGNOSIS — E785 Hyperlipidemia, unspecified: Secondary | ICD-10-CM | POA: Diagnosis not present

## 2017-06-21 DIAGNOSIS — Z8249 Family history of ischemic heart disease and other diseases of the circulatory system: Secondary | ICD-10-CM | POA: Diagnosis not present

## 2017-06-21 DIAGNOSIS — E1151 Type 2 diabetes mellitus with diabetic peripheral angiopathy without gangrene: Secondary | ICD-10-CM | POA: Diagnosis not present

## 2017-06-21 DIAGNOSIS — I2582 Chronic total occlusion of coronary artery: Secondary | ICD-10-CM | POA: Diagnosis not present

## 2017-06-21 DIAGNOSIS — N179 Acute kidney failure, unspecified: Secondary | ICD-10-CM | POA: Diagnosis not present

## 2017-06-21 DIAGNOSIS — I97630 Postprocedural hematoma of a circulatory system organ or structure following a cardiac catheterization: Secondary | ICD-10-CM | POA: Diagnosis not present

## 2017-06-21 DIAGNOSIS — M1990 Unspecified osteoarthritis, unspecified site: Secondary | ICD-10-CM | POA: Diagnosis not present

## 2017-06-21 DIAGNOSIS — E119 Type 2 diabetes mellitus without complications: Secondary | ICD-10-CM | POA: Diagnosis not present

## 2017-06-21 HISTORY — PX: RIGHT/LEFT HEART CATH AND CORONARY/GRAFT ANGIOGRAPHY: CATH118267

## 2017-06-21 LAB — BASIC METABOLIC PANEL
Anion gap: 7 (ref 5–15)
BUN: 25 mg/dL — ABNORMAL HIGH (ref 6–20)
CO2: 30 mmol/L (ref 22–32)
Calcium: 8.5 mg/dL — ABNORMAL LOW (ref 8.9–10.3)
Chloride: 104 mmol/L (ref 101–111)
Creatinine, Ser: 2.23 mg/dL — ABNORMAL HIGH (ref 0.61–1.24)
GFR calc Af Amer: 30 mL/min — ABNORMAL LOW (ref >60)
GFR calc non Af Amer: 26 mL/min — ABNORMAL LOW (ref >60)
Glucose, Bld: 325 mg/dL — ABNORMAL HIGH (ref 65–99)
Potassium: 3.1 mmol/L — ABNORMAL LOW (ref 3.5–5.1)
Sodium: 141 mmol/L (ref 135–145)

## 2017-06-21 LAB — GLUCOSE, CAPILLARY
Glucose-Capillary: 277 mg/dL — ABNORMAL HIGH (ref 65–99)
Glucose-Capillary: 292 mg/dL — ABNORMAL HIGH (ref 65–99)
Glucose-Capillary: 250 mg/dL — ABNORMAL HIGH (ref 65–99)
Glucose-Capillary: 234 mg/dL — ABNORMAL HIGH (ref 65–99)

## 2017-06-21 LAB — CBC
HCT: 34.4 % — ABNORMAL LOW (ref 39.0–52.0)
Hemoglobin: 11.2 g/dL — ABNORMAL LOW (ref 13.0–17.0)
MCH: 30.6 pg (ref 26.0–34.0)
MCHC: 32.6 g/dL (ref 30.0–36.0)
MCV: 94 fL (ref 78.0–100.0)
Platelets: 163 10*3/uL (ref 150–400)
RBC: 3.66 MIL/uL — ABNORMAL LOW (ref 4.22–5.81)
RDW: 14.1 % (ref 11.5–15.5)
WBC: 8.7 10*3/uL (ref 4.0–10.5)

## 2017-06-21 LAB — PROTIME-INR
INR: 0.97
Prothrombin Time: 12.8 seconds (ref 11.4–15.2)

## 2017-06-21 LAB — POCT I-STAT 3, VENOUS BLOOD GAS (G3P V)
Acid-Base Excess: 4 mmol/L — ABNORMAL HIGH (ref 0.0–2.0)
Acid-Base Excess: 4 mmol/L — ABNORMAL HIGH (ref 0.0–2.0)
Bicarbonate: 29.1 mmol/L — ABNORMAL HIGH (ref 20.0–28.0)
Bicarbonate: 29.2 mmol/L — ABNORMAL HIGH (ref 20.0–28.0)
O2 Saturation: 81 %
O2 Saturation: 83 %
TCO2: 30 mmol/L (ref 22–32)
TCO2: 31 mmol/L (ref 22–32)
pCO2, Ven: 44.5 mmHg (ref 44.0–60.0)
pCO2, Ven: 46.3 mmHg (ref 44.0–60.0)
pH, Ven: 7.408 (ref 7.250–7.430)
pH, Ven: 7.424 (ref 7.250–7.430)
pO2, Ven: 46 mmHg — ABNORMAL HIGH (ref 32.0–45.0)
pO2, Ven: 47 mmHg — ABNORMAL HIGH (ref 32.0–45.0)

## 2017-06-21 LAB — POCT I-STAT 3, ART BLOOD GAS (G3+)
Acid-Base Excess: 4 mmol/L — ABNORMAL HIGH (ref 0.0–2.0)
Bicarbonate: 29.2 mmol/L — ABNORMAL HIGH (ref 20.0–28.0)
O2 Saturation: 99 %
TCO2: 31 mmol/L (ref 22–32)
pCO2 arterial: 44.5 mmHg (ref 32.0–48.0)
pH, Arterial: 7.425 (ref 7.350–7.450)
pO2, Arterial: 121 mmHg — ABNORMAL HIGH (ref 83.0–108.0)

## 2017-06-21 SURGERY — RIGHT/LEFT HEART CATH AND CORONARY/GRAFT ANGIOGRAPHY
Anesthesia: LOCAL

## 2017-06-21 MED ORDER — LIDOCAINE HCL (PF) 1 % IJ SOLN
INTRAMUSCULAR | Status: AC
  Filled 2017-06-21: qty 30

## 2017-06-21 MED ORDER — FENTANYL CITRATE (PF) 100 MCG/2ML IJ SOLN
INTRAMUSCULAR | Status: AC
Start: 2017-06-21 — End: ?
  Filled 2017-06-21: qty 2

## 2017-06-21 MED ORDER — HEPARIN SODIUM (PORCINE) 5000 UNIT/ML IJ SOLN: 5000.0000 [IU] | Freq: Three times a day (TID) | INTRAMUSCULAR | Status: DC

## 2017-06-21 MED ORDER — IOPAMIDOL (ISOVUE-370) INJECTION 76%
INTRAVENOUS | Status: DC | PRN
  Administered 2017-06-21: 75 mL via INTRA_ARTERIAL

## 2017-06-21 MED ORDER — METOPROLOL TARTRATE 5 MG/5ML IV SOLN
5.0000 mg | Freq: Once | INTRAVENOUS | Status: AC
  Administered 2017-06-21: 5 mg via INTRAVENOUS
  Filled 2017-06-21: qty 5

## 2017-06-21 MED ORDER — HYDRALAZINE HCL 20 MG/ML IJ SOLN
INTRAMUSCULAR | Status: DC | PRN
  Administered 2017-06-21: 20 mg via INTRAVENOUS

## 2017-06-21 MED ORDER — FENTANYL CITRATE (PF) 100 MCG/2ML IJ SOLN
INTRAMUSCULAR | Status: DC | PRN
  Administered 2017-06-21 (×2): 25 ug via INTRAVENOUS

## 2017-06-21 MED ORDER — LIDOCAINE HCL (PF) 1 % IJ SOLN
INTRAMUSCULAR | Status: DC | PRN
  Administered 2017-06-21: 20 mL

## 2017-06-21 MED ORDER — POTASSIUM CHLORIDE CRYS ER 20 MEQ PO TBCR
40.0000 meq | EXTENDED_RELEASE_TABLET | Freq: Once | ORAL | Status: AC
  Administered 2017-06-21: 40 meq via ORAL
  Filled 2017-06-21: qty 2

## 2017-06-21 MED ORDER — SODIUM CHLORIDE 0.9 % IV SOLN: INTRAVENOUS | Status: AC

## 2017-06-21 MED ORDER — MIDAZOLAM HCL 2 MG/2ML IJ SOLN
INTRAMUSCULAR | Status: AC
  Filled 2017-06-21: qty 2

## 2017-06-21 MED ORDER — HYDRALAZINE HCL 20 MG/ML IJ SOLN
INTRAMUSCULAR | Status: AC
  Filled 2017-06-21: qty 1

## 2017-06-21 MED ORDER — MIDAZOLAM HCL 2 MG/2ML IJ SOLN
INTRAMUSCULAR | Status: DC | PRN
  Administered 2017-06-21 (×2): 1 mg via INTRAVENOUS

## 2017-06-21 MED ORDER — IOPAMIDOL (ISOVUE-370) INJECTION 76%
INTRAVENOUS | Status: AC
  Filled 2017-06-21: qty 125

## 2017-06-21 MED ORDER — SODIUM CHLORIDE 0.9% FLUSH: 3.0000 mL | Freq: Two times a day (BID) | INTRAVENOUS | Status: DC

## 2017-06-21 MED ORDER — HEPARIN (PORCINE) IN NACL 1000-0.9 UT/500ML-% IV SOLN
INTRAVENOUS | Status: AC
  Filled 2017-06-21: qty 1000

## 2017-06-21 MED ORDER — SODIUM CHLORIDE 0.9 % IV SOLN: 250.0000 mL | INTRAVENOUS | Status: DC | PRN

## 2017-06-21 MED ORDER — SODIUM CHLORIDE 0.9% FLUSH: 3.0000 mL | INTRAVENOUS | Status: DC | PRN

## 2017-06-21 MED ORDER — HEPARIN (PORCINE) IN NACL 2-0.9 UNITS/ML
INTRAMUSCULAR | Status: AC | PRN
  Administered 2017-06-21 (×2): 500 mL

## 2017-06-21 SURGICAL SUPPLY — 11 items
CATH INFINITI 5 FR LCB (CATHETERS) ×2 IMPLANT
CATH INFINITI 5FR MULTPACK ANG (CATHETERS) ×2 IMPLANT
CATH SWAN GANZ 7F STRAIGHT (CATHETERS) ×2 IMPLANT
COVER PRB 48X5XTLSCP FOLD TPE (BAG) ×1 IMPLANT
COVER PROBE 5X48 (BAG) ×1
KIT HEART LEFT (KITS) ×2 IMPLANT
PACK CARDIAC CATHETERIZATION (CUSTOM PROCEDURE TRAY) ×2 IMPLANT
SHEATH AVANTI 11CM 5FR (SHEATH) ×2 IMPLANT
SHEATH AVANTI 11CM 7FR (SHEATH) ×2 IMPLANT
TRANSDUCER W/STOPCOCK (MISCELLANEOUS) ×2 IMPLANT
WIRE EMERALD 3MM-J .035X150CM (WIRE) ×2 IMPLANT

## 2017-06-21 NOTE — Discharge Instructions (Signed)
You will need repeat blood work done on 06/25/17 to recheck your kidney's. Go to the lab at Dr. Lysbeth Penner office to have this done.

## 2017-06-21 NOTE — Progress Notes (Signed)
Order for office BMP placed for 06/25/17.

## 2017-06-21 NOTE — Progress Notes (Signed)
Results for HAZEM, KENNER (MRN [id number]) as of 06/21/2017 12:56  Ref. Range 06/20/2017 18:49 06/20/2017 20:45 06/21/2017 08:46 06/21/2017 09:42 06/21/2017 11:39  Glucose-Capillary Latest Ref Range: 65 - 99 mg/dL 299 (H) 259 (H) 277 (H) 292 (H) 250 (H)  Noted that blood sugars continue to be greater than 200 mg/dl. Recommend adding Lantus 12 units daily if blood sugars continue to be elevated and continue Novolog SENSITIVE correction scale.   Harvel Ricks RN BSN CDE Diabetes Coordinator Pager: [phone]  8am-5pm

## 2017-06-21 NOTE — Progress Notes (Signed)
PA for cardiology aware of current BP  Stated okay to give IV metropolol and all PO medications

## 2017-06-21 NOTE — Progress Notes (Signed)
Pt has new dressing on right groin. Clean, dry and intact. Education provided to pt and pt wife at bedside. Pt surround skin now soft to touch, no signs of further hematoma. PA aware

## 2017-06-21 NOTE — Progress Notes (Signed)
Pt to cath lab for a scheduled heart catheterization this AM.

## 2017-06-21 NOTE — Care Management Note (Signed)
Case Management Note  Patient Details  Name: [name]: [id number] Date of Birth: [birth year]  Subjective/Objective:    Progressive fatigue, weakness; cardiac cath               Action/Plan: Patient lives at home with spouse;  Is Dr Jani Gravel, MD; has private insurance with [facility] with prescription drug coverage; CM following for progression of care.  Expected Discharge Date:   possibly 06/22/2017               Expected Discharge Plan:  Home/Self Care  Discharge planning Services  CM Consult  Status of Service:  In process, will continue to follow  Sherrilyn Rist [phone] 06/21/2017, 12:00 PM

## 2017-06-21 NOTE — Progress Notes (Addendum)
Progress Note  Patient Name: [name] Date of Encounter: 06/21/2017  Primary Cardiologist: Pixie Casino, MD   Subjective   He is feeling ok post cath. No chest pain. No dyspnea. He developed a small groin hematoma post cath, that improved after additional compression. He denies any groin, flank or LBP.   Inpatient Medications    Scheduled Meds: . amLODipine  10 mg Oral QHS  . [START ON 06/22/2017] aspirin EC  81 mg Oral Daily  . atorvastatin  40 mg Oral QHS  . cloNIDine  0.2 mg Oral TID  . folic acid  1 mg Oral Daily  . [START ON 06/22/2017] heparin  5,000 Units Subcutaneous Q8H  . hydrALAZINE  100 mg Oral Q8H  . insulin aspart  0-5 Units Subcutaneous QHS  . insulin aspart  0-9 Units Subcutaneous TID WC  . sodium chloride flush  3 mL Intravenous Q12H  . sodium chloride flush  3 mL Intravenous Q12H   Continuous Infusions: . sodium chloride    . sodium chloride     PRN Meds: sodium chloride, sodium chloride, acetaminophen, ondansetron (ZOFRAN) IV, sodium chloride flush, sodium chloride flush   Vital Signs    Vitals:   06/21/17 1200 06/21/17 1316 06/21/17 1326 06/21/17 1425  BP: (!) 134/58 138/63 (!) 138/56 (!) 140/58  Pulse: (!) 51 (!) 50    Resp:  (!) 2    Temp:  97.9 F (36.6 C)    TempSrc:  Oral    SpO2:  98%    Weight:      Height:        Intake/Output Summary (Last 24 hours) at 06/21/2017 1523 Last data filed at 06/21/2017 1500 Gross per 24 hour  Intake 1367.5 ml  Output 900 ml  Net 467.5 ml   Filed Weights   06/20/17 1829 06/21/17 0500  Weight: 192 lb 4.8 oz (87.2 kg) 190 lb 1.6 oz (86.2 kg)    Telemetry    NSR - Personally Reviewed  ECG    NSR- Personally Reviewed  Physical Exam   GEN: No acute distress.    Neck: No JVD Cardiac: RRR, no murmurs, rubs, or gallops.  Respiratory: Clear to auscultation bilaterally. GI: Soft, nontender, non-distended  MS: 1+ bilateral LEE; No deformity. Neuro:  Nonfocal  Psych: Normal affect    Labs    Chemistry Recent Labs  Lab 06/19/17 0940 06/21/17 0357  NA 144 141  K 3.0* 3.1*  CL 99 104  CO2 29 30  GLUCOSE 314* 325*  BUN 27 25*  CREATININE 2.07* 2.23*  CALCIUM 9.0 8.5*  GFRNONAA 29* 26*  GFRAA 34* 30*  ANIONGAP  --  7     Hematology Recent Labs  Lab 06/19/17 0940 06/21/17 0357  WBC 9.3 8.7  RBC 4.12* 3.66*  HGB 13.2 11.2*  HCT 38.1 34.4*  MCV 93 94.0  MCH 32.0 30.6  MCHC 34.6 32.6  RDW 14.1 14.1  PLT 201 163    Cardiac EnzymesNo results for input(s): TROPONINI in the last 168 hours. No results for input(s): TROPIPOC in the last 168 hours.   BNPNo results for input(s): BNP, PROBNP in the last 168 hours.   DDimer No results for input(s): DDIMER in the last 168 hours.   Radiology    Dg Chest 2 View  Result Date: 06/20/2017 CLINICAL DATA:  Shortness of breath. EXAM: CHEST - 2 VIEW COMPARISON:  08/04/2009 FINDINGS: Postoperative changes in the mediastinum. Normal heart size and pulmonary vascularity. Small bilateral  pleural effusions, greater on the right. Infiltration in the right lung base could be due to pneumonia or atelectasis. No pneumothorax. Probable calcified pleural plaques in the left costophrenic angle. Calcification of the aorta. Degenerative changes in the spine and shoulders. IMPRESSION: Small bilateral pleural effusions, greater on the right. Infiltration in the right lung base could indicate pneumonia or atelectasis. Aortic atherosclerosis. Electronically Signed   By: Lucienne Capers M.D.   On: 06/20/2017 22:45    Cardiac Studies   Procedures   RIGHT/LEFT HEART CATH AND CORONARY/GRAFT ANGIOGRAPHY  Conclusion     Prox RCA to Mid RCA lesion is 30% stenosed.  Mid RCA lesion is 40% stenosed.  Dist RCA lesion is 30% stenosed.  Ost LAD to Prox LAD lesion is 100% stenosed.  LIMA graft was visualized by angiography and is normal in caliber.  Ost Cx to Prox Cx lesion is 90% stenosed.  Hemodynamic findings consistent with  mild pulmonary hypertension.  LV end diastolic pressure is normal.   1. Severe double vessel CAD s/p 3V CABG with 3/3 patent bypass grafts  2. The LAD has 100% proximal occlusion. The entire LAD fills from the patent LIMA graft. The Diagonal branch fills from the vein graft 3. The Circumflex has 90% proximal stenosis. The vein graft fills the large obtuse marginal graft.  4. The RCA is a large dominant vessel with mild diffuse calcific plaque in the proximal, mid and distal vessel but no obstructive lesions.  5. The LIMA to the LAD is patent 6 The sequential saphenous vein graft to the Diagonal and OM is patent with mild filling defects seen in the proximal, mid and distal segments of the body of the graft. These lesions do not appear to be flow limiting.  7. Mild aortic stenosis. Peak to peak gradient 4 mmHg. The valve was easily crossed.  8. Right and left heart filling pressures outline above.   Recommendations: Continue medical management of CAD. Continue aggressive blood pressure control and diuresis for diastolic CHF. Will hydrate for 6 hours post cath and plan discharge home later today.      Patient Profile     84 y.o. male w/ a h/o CAD s/p CABG x 3 in the 1980s, DM, Aortic Stenosis, CHF and chronic renal insuffiency with recent exertional fatigue, concerning for unstable angina. No chest pain or dyspnea. Definitive LHC recommended. Given renal insuffiencey, he was admitted the night prior to planned procedure. On 05/2417, for pre cath hydration.     Assessment & Plan    1. CAD:  H/o CABG in the 1980s. Cath today showed severe double vessel CAD s/p 3V CABG with 3/3 patent bypass grafts. No indication for PCI. Continued medical therapy for CAD recommended. Pt is being hydrated post cath given renal insuffiencey.   2. CKD: Pre procedure SCr was 2.23. Home lasix and irbesartan on hold. He is getting IV hydration post cath x 6 hrs.   3. Aortic Stenosis: Mild aortic stenosis by cath  assessment. Peak to peak gradient 4 mmHg. The valve was easily crossed.  4. Right Groin Hematoma Post Cath: noted when pt returned to telemetry unit. Additional compression was held. Hemostasis achieved. The area is soft. Non tender. He denies any anterior groin, flank and low back pain. We discussed precautions.   5. LEE: 1+ bilateral LEE noted on exam. He denies dyspnea, orthopnea and PND. Lungs are CTAB. Edema may be due to amlodipine, which was recently increased a couple of days ago to 10 mg  daily.   6. Pulmonary HTN: Right heart cath measurements consistent with mild pulmonary HTN.    For questions or updates, please contact [place] Please consult [email or web address] for contact info under Cardiology/STEMI.      Signed, Lyda Jester, PA-C  06/21/2017, 3:23 PM     Patient seen and examined. Agree with assessment and plan.  Cath data reviewed with patient and wife.  Patent grafts.  Medical therapy for concomitant CAD.  Patient has completed greater than 6 hours of hydration postprocedure.  Groin cath site is stable.  No ecchymosis remains soft.  Okay for discharge today with follow-up with Dr. Debara Pickett.   Troy Sine, MD, [facility] 06/21/2017 4:25 PM

## 2017-06-21 NOTE — Progress Notes (Signed)
Pt requesting update from MD. Paged PA, PA stated Rosita Fire is on the floor and will talk with pt  Pt and pt wife aware

## 2017-06-21 NOTE — Interval H&P Note (Signed)
History and Physical Interval Note:  06/21/2017 7:25 AM  [name]  has presented today for cardiac cath with the diagnosis of CAD, unstable angina, dyspnea on exertion. The various methods of treatment have been discussed with the patient and family. After consideration of risks, benefits and other options for treatment, the patient has consented to  Procedure(s): RIGHT/LEFT HEART CATH AND CORONARY/GRAFT ANGIOGRAPHY (N/A) as a surgical intervention .  The patient's history has been reviewed, patient examined, no change in status, stable for surgery.  I have reviewed the patient's chart and labs.  Questions were answered to the patient's satisfaction.    Cath Lab Visit (complete for each Cath Lab visit)  Clinical Evaluation Leading to the Procedure:   ACS: No.  Non-ACS:    Anginal Classification: CCS II  Anti-ischemic medical therapy: Minimal Therapy (1 class of medications)  Non-Invasive Test Results: No non-invasive testing performed  Prior CABG: Previous CABG        Lauree Chandler

## 2017-06-21 NOTE — Progress Notes (Signed)
Paged PA cardiology to inform of groin site bleed  Pt vitals stable  Cath lab RN still at bedside

## 2017-06-21 NOTE — Progress Notes (Signed)
Pt IVs discontinued, catheters intact and telemetry removed. Pt discharge education provided at bedside with pt and pt wife. Pt wife has all belongings. Pt discharged via wheelchair with nurse tech

## 2017-06-21 NOTE — Discharge Summary (Signed)
Discharge Summary    Patient ID: [name],  MRN: [id number], DOB/AGE: [birth year] 84 y.o.  Admit date: 06/20/2017 Discharge date: 06/21/2017  Primary Care Provider: Jani Gravel Primary Cardiologist: Pixie Casino, MD  Discharge Diagnoses    Active Problems:   CAD (coronary artery disease) of artery bypass graft   CKD (chronic kidney disease) stage 4, GFR 15-29 ml/min (HCC)   Aortic valve stenosis   DM (diabetes mellitus) (HCC)   SOB (shortness of breath)   SOB (shortness of breath) on exertion   Allergies No Known Allergies  Diagnostic Studies/Procedures    Procedures   RIGHT/LEFT HEART CATH AND CORONARY/GRAFT ANGIOGRAPHY  Conclusion     Prox RCA to Mid RCA lesion is 30% stenosed.  Mid RCA lesion is 40% stenosed.  Dist RCA lesion is 30% stenosed.  Ost LAD to Prox LAD lesion is 100% stenosed.  LIMA graft was visualized by angiography and is normal in caliber.  Ost Cx to Prox Cx lesion is 90% stenosed.  Hemodynamic findings consistent with mild pulmonary hypertension.  LV end diastolic pressure is normal.  1. Severe double vessel CAD s/p 3V CABG with 3/3 patent bypass grafts  2. The LAD has 100% proximal occlusion. The entire LAD fills from the patent LIMA graft. The Diagonal branch fills from the vein graft 3. The Circumflex has 90% proximal stenosis. The vein graft fills the large obtuse marginal graft.  4. The RCA is a large dominant vessel with mild diffuse calcific plaque in the proximal, mid and distal vessel but no obstructive lesions.  5. The LIMA to the LAD is patent 6 The sequential saphenous vein graft to the Diagonal and OM is patent with mild filling defects seen in the proximal, mid and distal segments of the body of the graft. These lesions do not appear to be flow limiting.  7. Mild aortic stenosis. Peak to peak gradient 4 mmHg. The valve was easily crossed.  8. Right and left heart filling pressures outline above.    Recommendations: Continue medical management of CAD. Continue aggressive blood pressure control and diuresis for diastolic CHF. Will hydrate for 6 hours post cath and plan discharge home later today.        History of Present Illness     84 y.o. male w/ a h/o CAD s/p CABG x 3 in the 1980s, DM, Aortic Stenosis, CHF and chronic renal insuffiency with recent exertional fatigue, concerning for unstable angina. No chest pain or dyspnea. Definitive LHC recommended. Given renal insuffiencey, he was admitted the night prior to planned procedure. On 05/2417, for pre cath hydration.   Hospital Course     Pt presented on 06/20/17 for precath hydration in stable condition. He was started on IV fluids. His home diuretic and ARB was held on admit. SCr morning of cath was 2.23 c/w baseline. Cardiac cath was performed by Dr. Angelena Form, via the right femoral artery. Cath showed severe double vessel CAD s/p 3V CABG with 3/3 patent bypass grafts . Stable disease and no indication for PCI. Continued medical therapy was reccommended. Right heart cath was also performed and demonstrated mild pulmonary HTN. The aortic valve was also checked given aortic stenosis. Mild aortic stenosis was noted by cath assessment. Peak to peak gradient 4 mmHg. The valve was easily crossed. He tolerated the procedure well and left the cath lab in stable condition. He was kept several hours for monitoring and for continued hydration. Dr. Angelena Form recommended 6 hrs of post cath  hydration. Shortly after cath, he developed a small groin hematoma that was managed with additional compression. Hemostasis was achieved. Groin reassessed and was soft and stable w/o tenderness and no bruit. Vital signs remained stable. He denied CP and dyspnea. Pt was last seen and examined by Dr. Claiborne Billings, who determined he was stable for d/c home. F/u office BMP will be ordered early next week. He has hospital f/u scheduled for 07/12/17.   Consultants:  none    Discharge Vitals Blood pressure (!) 140/58, pulse (!) 50, temperature 97.9 F (36.6 C), temperature source Oral, resp. rate (!) 2, height 5\' 11"  (1.803 m), weight 190 lb 1.6 oz (86.2 kg), SpO2 98 %.  Filed Weights   06/20/17 1829 06/21/17 0500  Weight: 192 lb 4.8 oz (87.2 kg) 190 lb 1.6 oz (86.2 kg)    Labs & Radiologic Studies    CBC Recent Labs    06/19/17 0940 06/21/17 0357  WBC 9.3 8.7  HGB 13.2 11.2*  HCT 38.1 34.4*  MCV 93 94.0  PLT 201 638   Basic Metabolic Panel Recent Labs    06/19/17 0940 06/21/17 0357  NA 144 141  K 3.0* 3.1*  CL 99 104  CO2 29 30  GLUCOSE 314* 325*  BUN 27 25*  CREATININE 2.07* 2.23*  CALCIUM 9.0 8.5*   Liver Function Tests No results for input(s): AST, ALT, ALKPHOS, BILITOT, PROT, ALBUMIN in the last 72 hours. No results for input(s): LIPASE, AMYLASE in the last 72 hours. Cardiac Enzymes No results for input(s): CKTOTAL, CKMB, CKMBINDEX, TROPONINI in the last 72 hours. BNP Invalid input(s): POCBNP D-Dimer No results for input(s): DDIMER in the last 72 hours. Hemoglobin A1C No results for input(s): HGBA1C in the last 72 hours. Fasting Lipid Panel No results for input(s): CHOL, HDL, LDLCALC, TRIG, CHOLHDL, LDLDIRECT in the last 72 hours. Thyroid Function Tests No results for input(s): TSH, T4TOTAL, T3FREE, THYROIDAB in the last 72 hours.  Invalid input(s): FREET3 _____________  Dg Chest 2 View  Result Date: 06/20/2017 CLINICAL DATA:  Shortness of breath. EXAM: CHEST - 2 VIEW COMPARISON:  08/04/2009 FINDINGS: Postoperative changes in the mediastinum. Normal heart size and pulmonary vascularity. Small bilateral pleural effusions, greater on the right. Infiltration in the right lung base could be due to pneumonia or atelectasis. No pneumothorax. Probable calcified pleural plaques in the left costophrenic angle. Calcification of the aorta. Degenerative changes in the spine and shoulders. IMPRESSION: Small bilateral pleural  effusions, greater on the right. Infiltration in the right lung base could indicate pneumonia or atelectasis. Aortic atherosclerosis. Electronically Signed   By: Lucienne Capers M.D.   On: 06/20/2017 22:45   Disposition   Pt is being discharged home today in good condition.  Follow-up Plans & Appointments     Discharge Instructions    Diet - low sodium heart healthy   Complete by:  As directed    Increase activity slowly   Complete by:  As directed    Lifting restrictions   Complete by:  As directed    Do not lift anything heavier than 1/2 gallon of milk for the next 3 days      Discharge Medications   Allergies as of 06/21/2017   No Known Allergies     Medication List    TAKE these medications   amLODipine 10 MG tablet Commonly known as:  NORVASC Take 10 mg by mouth at bedtime.   aspirin EC 81 MG tablet Take 81 mg by mouth daily.  atorvastatin 40 MG tablet Commonly known as:  LIPITOR Take 40 mg by mouth at bedtime.   cloNIDine 0.2 MG tablet Commonly known as:  CATAPRES Take 0.2 mg by mouth 3 (three) times daily.   CoQ10 200 MG Caps Take 1 capsule by mouth daily.   folic acid 1 MG tablet Commonly known as:  FOLVITE Take 1 mg by mouth daily.   furosemide 40 MG tablet Commonly known as:  LASIX Take 40 mg by mouth daily.   glimepiride 4 MG tablet Commonly known as:  AMARYL Take 6 mg by mouth daily with breakfast.   hydrALAZINE 100 MG tablet Commonly known as:  APRESOLINE Take 100 mg by mouth 3 (three) times daily.   irbesartan 300 MG tablet Commonly known as:  AVAPRO Take 1 tablet (300 mg total) by mouth every morning.   multivitamin tablet Take 3 tablets by mouth daily.   ONE TOUCH ULTRA TEST test strip Generic drug:  glucose blood TEST 1-2 TIMES PER DAY   onetouch ultrasoft lancets USE TO TEST BLOOD SUGAR 1-2 TIMES DAILY   OVER THE COUNTER MEDICATION Place 1 spray into the nose daily as needed. Sovereign Silver Nasal Spray for Immune  Support   TRADJENTA 5 MG Tabs tablet Generic drug:  linagliptin Take 5 mg by mouth every morning.   vitamin E 400 UNIT capsule Take 400 Units by mouth daily.          Outstanding Labs/Studies   Repeat BMP 06/25/17  Duration of Discharge Encounter   Greater than 30 minutes including physician time.  Signed,  Lyda Jester, PA-C 06/21/2017, 5:02 PM

## 2017-06-21 NOTE — Progress Notes (Addendum)
Site area: RFA/RFV Site Prior to Removal:  Level  0 Pressure Applied For:20 min Manual:   yes Patient Status During Pull:  stable Post Pull Site:  Level 0 Post Pull Instructions Given: yes   Post Pull Pulses Present: doppler Dressing Applied:  clear Bedrest begins @ 0910 till 1310 Comments: removed by Margreta Journey Osborne/ canderson

## 2017-06-21 NOTE — Progress Notes (Signed)
Rechecked pt groin site. Upon assessment pt now has hematoma and is bleeding. Primary RN held direct pressure. Called cath lab. Upon cath lab arrival, hematoma is smaller in size and bleeding has stopped. Education provided to pt and pt family. Cath lab RN holding pressure at this time

## 2017-06-22 MED FILL — Heparin Sod (Porcine)-NaCl IV Soln 1000 Unit/500ML-0.9%: INTRAVENOUS | Qty: 1000 | Status: AC

## 2017-06-25 DIAGNOSIS — E876 Hypokalemia: Secondary | ICD-10-CM | POA: Diagnosis not present

## 2017-06-25 DIAGNOSIS — Z79899 Other long term (current) drug therapy: Secondary | ICD-10-CM | POA: Diagnosis not present

## 2017-06-26 ENCOUNTER — Telehealth: Payer: Self-pay | Admitting: Internal Medicine

## 2017-06-26 LAB — BASIC METABOLIC PANEL
BUN/Creatinine Ratio: 7 — ABNORMAL LOW (ref 10–24)
BUN: 17 mg/dL (ref 8–27)
CO2: 27 mmol/L (ref 20–29)
Calcium: 8.3 mg/dL — ABNORMAL LOW (ref 8.6–10.2)
Chloride: 105 mmol/L (ref 96–106)
Creatinine, Ser: 2.39 mg/dL — ABNORMAL HIGH (ref 0.76–1.27)
GFR calc Af Amer: 28 mL/min/{1.73_m2} — ABNORMAL LOW (ref >59)
GFR calc non Af Amer: 25 mL/min/{1.73_m2} — ABNORMAL LOW (ref >59)
Glucose: 282 mg/dL — ABNORMAL HIGH (ref 65–99)
Potassium: 3.2 mmol/L — ABNORMAL LOW (ref 3.5–5.2)
Sodium: 146 mmol/L — ABNORMAL HIGH (ref 134–144)

## 2017-06-26 NOTE — Telephone Encounter (Signed)
-----   Message from Pixie Casino, MD sent at 06/25/2017 11:43 AM EDT ----- Regarding: RE: constipation I would d/w his PCP.  Dr. Lemmie Evens  ----- Message ----- From: Fidel Levy, RN Sent: 06/25/2017  10:22 AM To: Pixie Casino, MD Subject: constipation                                   Patient was in office today for labs. He reports one of his medications is causing him to be constipated. He states he has tried colace, dulcolax, miralax, epsom salts. He states he is staying hydrating, eating fiber-rich diet  Any recommendations?

## 2017-06-26 NOTE — Telephone Encounter (Signed)
Patient called with BMET results and notified of MD recommendations.

## 2017-07-02 DIAGNOSIS — R194 Change in bowel habit: Secondary | ICD-10-CM | POA: Diagnosis not present

## 2017-07-02 DIAGNOSIS — Z86010 Personal history of colonic polyps: Secondary | ICD-10-CM | POA: Diagnosis not present

## 2017-07-02 DIAGNOSIS — I2510 Atherosclerotic heart disease of native coronary artery without angina pectoris: Secondary | ICD-10-CM | POA: Diagnosis not present

## 2017-07-05 DIAGNOSIS — E119 Type 2 diabetes mellitus without complications: Secondary | ICD-10-CM | POA: Diagnosis not present

## 2017-07-05 DIAGNOSIS — I10 Essential (primary) hypertension: Secondary | ICD-10-CM | POA: Diagnosis not present

## 2017-07-12 ENCOUNTER — Ambulatory Visit: Payer: Medicare Other | Admitting: Adult Health

## 2017-07-12 DIAGNOSIS — E756 Lipid storage disorder, unspecified: Secondary | ICD-10-CM | POA: Diagnosis not present

## 2017-07-12 DIAGNOSIS — I10 Essential (primary) hypertension: Secondary | ICD-10-CM | POA: Diagnosis not present

## 2017-07-12 DIAGNOSIS — E789 Disorder of lipoprotein metabolism, unspecified: Secondary | ICD-10-CM | POA: Diagnosis not present

## 2017-07-12 DIAGNOSIS — E119 Type 2 diabetes mellitus without complications: Secondary | ICD-10-CM | POA: Diagnosis not present

## 2017-07-18 DIAGNOSIS — N183 Chronic kidney disease, stage 3 (moderate): Secondary | ICD-10-CM | POA: Diagnosis not present

## 2017-07-18 DIAGNOSIS — I10 Essential (primary) hypertension: Secondary | ICD-10-CM | POA: Diagnosis not present

## 2017-07-18 DIAGNOSIS — E118 Type 2 diabetes mellitus with unspecified complications: Secondary | ICD-10-CM | POA: Diagnosis not present

## 2017-07-18 DIAGNOSIS — K7689 Other specified diseases of liver: Secondary | ICD-10-CM | POA: Diagnosis not present

## 2017-07-18 DIAGNOSIS — Z79899 Other long term (current) drug therapy: Secondary | ICD-10-CM | POA: Diagnosis not present

## 2017-07-20 DIAGNOSIS — E113293 Type 2 diabetes mellitus with mild nonproliferative diabetic retinopathy without macular edema, bilateral: Secondary | ICD-10-CM | POA: Diagnosis not present

## 2017-07-26 DIAGNOSIS — K7689 Other specified diseases of liver: Secondary | ICD-10-CM | POA: Diagnosis not present

## 2017-07-27 DIAGNOSIS — E113413 Type 2 diabetes mellitus with severe nonproliferative diabetic retinopathy with macular edema, bilateral: Secondary | ICD-10-CM | POA: Diagnosis not present

## 2017-08-01 DIAGNOSIS — K7689 Other specified diseases of liver: Secondary | ICD-10-CM | POA: Diagnosis not present

## 2017-08-06 DIAGNOSIS — E789 Disorder of lipoprotein metabolism, unspecified: Secondary | ICD-10-CM | POA: Diagnosis not present

## 2017-08-06 DIAGNOSIS — I2510 Atherosclerotic heart disease of native coronary artery without angina pectoris: Secondary | ICD-10-CM | POA: Diagnosis not present

## 2017-08-06 DIAGNOSIS — E119 Type 2 diabetes mellitus without complications: Secondary | ICD-10-CM | POA: Diagnosis not present

## 2017-08-06 DIAGNOSIS — N183 Chronic kidney disease, stage 3 (moderate): Secondary | ICD-10-CM | POA: Diagnosis not present

## 2017-08-06 DIAGNOSIS — E1122 Type 2 diabetes mellitus with diabetic chronic kidney disease: Secondary | ICD-10-CM | POA: Diagnosis not present

## 2017-08-07 NOTE — Progress Notes (Signed)
Cardiology Office Note   Date:  08/08/2017   ID:  [name], DOB 1933/07/21, MRN [id number]  PCP:  Nathaniel Gravel, MD  Cardiologist: Dr. Debara Pickett  Chief Complaint  Patient presents with  . Hospitalization Follow-up  . Edema     History of Present Illness: [name] is a 84 y.o. male who presents for posthospitalization follow-up after admission for unstable angina.  The patient has a history of coronary artery disease with CABG x3 in the 1980s, aortic valve stenosis, CHF, chronic renal insufficiency, type 2 diabetes.  He was admitted for left heart catheterization, with precath hydration. He was discharged on 06/21/2017. Weight at that time 191 lbs.   Cardiac catheterization revealed double vessel CAD status post three-vessel CABG with 3 of 3 patent bypass grafts.  The patient was found to have stable disease and had  no indication for PCI with continued medical management recommended.  A right heart catheterization was also completed which demonstrated mild pulmonary hypertension.  Aortic valve revealed mild aortic stenosis with a peak to peak gradient of 4 mmHg.  The patient had post catheterization hydration as well.  Post catheterization the patient developed a small groin hematoma which was managed with additional compression.  The patient otherwise recovered well, without recurrent chest pain or dyspnea.  The patient was to have a follow-up BMET post hospitalization.  BMET on 06/25/2017 revealed hypokalemia with potassium of 3.2.  He was found to have elevated blood glucose of 282.  Has been seen by nephrology on 06/18/2017 and gliflozin for diabetes was discontinued. Amlodipine was started at 10 mg daily to take at HS.   He comes today having gained 24 lbs with extensive fluid overload. He has not been having response from po lasix. He is now sleeping in a recliner. Most recent labs dated 07/05/2017 revealed creatinine of 2.41, potassium of 2.6. He has edema up into his thighs  bilaterally, abdomen along with sacral edema.    Past Medical History:  Diagnosis Date  . Arthritis    "a little in LLE" (06/20/2017)  . CKD (chronic kidney disease), stage II    AKI on CKD2/notes 06/20/2017  . Coronary artery disease   . Dyslipidemia   . Hypertension   . Peripheral artery disease (HCC)    mild  . Pneumonia ~ 1950 X 1  . Prostate cancer ([place]) ~ 2015  . Type II diabetes mellitus ([place])     Past Surgical History:  Procedure Laterality Date  . CARDIAC CATHETERIZATION     "a couple times" (06/20/2017)  . CATARACT EXTRACTION W/ INTRAOCULAR LENS  IMPLANT, BILATERAL Bilateral   . CORONARY ARTERY BYPASS GRAFT  03/06/1985   LAD and first diagonal/circumflex (sequential saphenous vein graft,cardioplegia  . INSERTION PROSTATE RADIATION SEED  ~ 2015  . NM MYOCAR PERF WALL MOTION  09/12/2006   no ischemia  . RIGHT/LEFT HEART CATH AND CORONARY/GRAFT ANGIOGRAPHY N/A 06/21/2017   Procedure: RIGHT/LEFT HEART CATH AND CORONARY/GRAFT ANGIOGRAPHY;  Surgeon: Burnell Blanks, MD;  Location: [facility];  Service: Cardiovascular;  Laterality: N/A;  . US ECHOCARDIOGRAPHY  11/04/2008   trace TR & MR     Current Outpatient Medications  Medication Sig Dispense Refill  . amLODipine (NORVASC) 10 MG tablet Take 10 mg by mouth at bedtime.    Marland Kitchen aspirin EC 81 MG tablet Take 81 mg by mouth daily.    Marland Kitchen atorvastatin (LIPITOR) 40 MG tablet Take 40 mg by mouth at bedtime.     Marland Kitchen  Coenzyme Q10 (COQ10) 200 MG CAPS Take 1 capsule by mouth daily.     . folic acid (FOLVITE) 1 MG tablet Take 1 mg by mouth daily.    . furosemide (LASIX) 40 MG tablet Take 40 mg by mouth 2 (two) times daily.     Marland Kitchen glimepiride (AMARYL) 4 MG tablet Take 6 mg by mouth daily with breakfast.    . glucose blood (ONE TOUCH ULTRA TEST) test strip TEST 1-2 TIMES PER DAY    . hydrALAZINE (APRESOLINE) 100 MG tablet Take 100 mg by mouth 3 (three) times daily.    . irbesartan (AVAPRO) 300 MG tablet Take 1 tablet (300 mg  total) by mouth every morning. 30 tablet 5  . Lancets (ONETOUCH ULTRASOFT) lancets USE TO TEST BLOOD SUGAR 1-2 TIMES DAILY    . Multiple Vitamin (MULTIVITAMIN) tablet Take 3 tablets by mouth daily.    Marland Kitchen OVER THE COUNTER MEDICATION Place 1 spray into the nose daily as needed. Sovereign Silver Nasal Spray for Immune Support    . TRADJENTA 5 MG TABS tablet Take 5 mg by mouth every morning.     . vitamin E 400 UNIT capsule Take 400 Units by mouth daily.      No current facility-administered medications for this visit.     Allergies:   Patient has no known allergies.    Social History:  The patient  reports that he quit smoking about 44 years ago. His smoking use included cigarettes. He has a 20.00 pack-year smoking history. He has never used smokeless tobacco. He reports that he drank alcohol. He reports that he does not use drugs.   Family History:  The patient's family history includes Heart attack in his mother.    ROS: All other systems are reviewed and negative. Unless otherwise mentioned in H&P    PHYSICAL EXAM: VS:  BP 140/72   Pulse 60   Ht 5' 11.5" (1.816 m)   Wt 216 lb 9.6 oz (98.2 kg)   BMI 29.79 kg/m  , BMI Body mass index is 29.79 kg/m. GEN: Well nourished, well developed, in no acute distress  HEENT: normal  Neck: no JVD, carotid bruits, or masses Cardiac: RRR; heart sounds soft systolic murmur no , rubs, or gallops, 3+ bilateral edema to his thighs with sacral edema.  Respiratory:  clear to auscultation bilaterally, normal work of breathing GI: soft, nontender, distended, + BS  MS: no deformity or atrophy  Skin: warm and dry, no rash Neuro:  Strength and sensation are intact Psych: euthymic mood, full affect   EKG:  Not completed this office visit   Recent Labs: 02/28/2017: NT-Pro BNP 1,277 06/21/2017: Hemoglobin 11.2; Platelets 163 06/25/2017: BUN 17; Creatinine, Ser 2.39; Potassium 3.2; Sodium 146    Lipid Panel No results found for: CHOL, TRIG, HDL,  CHOLHDL, VLDL, LDLCALC, LDLDIRECT    Wt Readings from Last 3 Encounters:  08/08/17 216 lb 9.6 oz (98.2 kg)  06/21/17 190 lb 1.6 oz (86.2 kg)  06/19/17 193 lb (87.5 kg)      Other studies Reviewed:  Echocardiogram 02/28/2017  - Left ventricle: The cavity size was normal. There was mild   concentric hypertrophy. Systolic function was vigorous. The   estimated ejection fraction was in the range of 65% to 70%. Wall   motion was normal; there were no regional wall motion   abnormalities. Features are consistent with a pseudonormal left   ventricular filling pattern, with concomitant abnormal relaxation   and increased filling pressure (  grade 2 diastolic dysfunction).   Doppler parameters are consistent with high ventricular filling   pressure. - Aortic valve: There was mild stenosis. There was no   regurgitation. Peak velocity (S): 269 cm/s. Mean gradient (S): 16   mm Hg. - Mitral valve: Transvalvular velocity was within the normal range.   There was no evidence for stenosis. There was mild regurgitation. - Left atrium: The atrium was moderately dilated. - Right ventricle: The cavity size was normal. Wall thickness was   normal. Systolic function was normal. - Atrial septum: No defect or patent foramen ovale was identified. - Tricuspid valve: There was mild regurgitation. Peak RV-RA   gradient (S): 47 mm Hg. - Pulmonary arteries: Systolic pressure was moderately increased.   PA peak pressure: 50 mm Hg (S). - Pericardium, extracardiac: A small pericardial effusion was   identified.  Cardiac Cath 06/21/2017 Conclusion     Prox RCA to Mid RCA lesion is 30% stenosed.  Mid RCA lesion is 40% stenosed.  Dist RCA lesion is 30% stenosed.  Ost LAD to Prox LAD lesion is 100% stenosed.  LIMA graft was visualized by angiography and is normal in caliber.  Ost Cx to Prox Cx lesion is 90% stenosed.  Hemodynamic findings consistent with mild pulmonary hypertension.  LV end diastolic  pressure is normal.   1. Severe double vessel CAD s/p 3V CABG with 3/3 patent bypass grafts  2. The LAD has 100% proximal occlusion. The entire LAD fills from the patent LIMA graft. The Diagonal branch fills from the vein graft 3. The Circumflex has 90% proximal stenosis. The vein graft fills the large obtuse marginal graft.  4. The RCA is a large dominant vessel with mild diffuse calcific plaque in the proximal, mid and distal vessel but no obstructive lesions.  5. The LIMA to the LAD is patent 6 The sequential saphenous vein graft to the Diagonal and OM is patent with mild filling defects seen in the proximal, mid and distal segments of the body of the graft. These lesions do not appear to be flow limiting.  7. Mild aortic stenosis. Peak to peak gradient 4 mmHg. The valve was easily crossed.  8. Right and left heart filling pressures outline above.     Recommendations: Continue medical management of CAD. Continue aggressive blood pressure control and diuresis for diastolic CHF.    ASSESSMENT AND PLAN:  1. Acute on Chronic Diastolic CHF: Multifactorial with significant renal insufficiency, diastolic dysfunction, not responding to OP po lasix. Weight of 17-20 lbs Patent will be admitted for IV diureses. He will need nephrology consultation for renal management. I have discussed the need for hospitalization with IV diuretics. The patient was initially reluctant but has now agreed to be admitted for IV diureses. He was found to be hypokalemic on labs 07/05/2017 and therefore will need repletion.   2. CKD Stage II-IV: GFR 24. He will need nephrology consult during admission  3. CAD: Hx of CABG with recent cath in June 21, 2017 with patent grafts. Normal LV function with mildly elevated pulmonary pressure.   4. Diabetes: To be followed by PCP. May need Hospitalist consult.   Dr Martinique who is DOD on site today has seen and examined this patient and is in agreement that patient should be  hospitalized.    Current medicines are reviewed at length with the patient today.    Labs/ tests ordered today include: NONE-ADMIT  Phill Myron. West Pugh, ANP, AACC   08/08/2017 2:23 PM  [facility] [address] [place], [place] [address] Phone: [phone]; Fax: [phone]

## 2017-08-08 ENCOUNTER — Other Ambulatory Visit: Payer: Self-pay

## 2017-08-08 ENCOUNTER — Ambulatory Visit: Payer: Medicare Other | Admitting: Adult Health

## 2017-08-08 ENCOUNTER — Encounter (HOSPITAL_COMMUNITY): Payer: Self-pay | Admitting: General Practice

## 2017-08-08 ENCOUNTER — Encounter: Payer: Self-pay | Admitting: Adult Health

## 2017-08-08 ENCOUNTER — Inpatient Hospital Stay (HOSPITAL_COMMUNITY)
Admission: AD | Admit: 2017-08-08 | Discharge: 2017-08-15 | DRG: 291 | Disposition: A | Discharge status: Home Health | Payer: Medicare Other | Source: Ambulatory Visit | Attending: Cardiology | Admitting: Cardiology

## 2017-08-08 VITALS — BP 140/72 | HR 60 | Ht 71.5 in | Wt 216.6 lb

## 2017-08-08 DIAGNOSIS — Z961 Presence of intraocular lens: Secondary | ICD-10-CM | POA: Diagnosis present

## 2017-08-08 DIAGNOSIS — I5031 Acute diastolic (congestive) heart failure: Secondary | ICD-10-CM | POA: Diagnosis not present

## 2017-08-08 DIAGNOSIS — Z87891 Personal history of nicotine dependence: Secondary | ICD-10-CM | POA: Diagnosis not present

## 2017-08-08 DIAGNOSIS — E1151 Type 2 diabetes mellitus with diabetic peripheral angiopathy without gangrene: Secondary | ICD-10-CM | POA: Diagnosis not present

## 2017-08-08 DIAGNOSIS — Z951 Presence of aortocoronary bypass graft: Secondary | ICD-10-CM | POA: Diagnosis not present

## 2017-08-08 DIAGNOSIS — R39198 Other difficulties with micturition: Secondary | ICD-10-CM

## 2017-08-08 DIAGNOSIS — E1165 Type 2 diabetes mellitus with hyperglycemia: Secondary | ICD-10-CM | POA: Diagnosis not present

## 2017-08-08 DIAGNOSIS — Z79899 Other long term (current) drug therapy: Secondary | ICD-10-CM

## 2017-08-08 DIAGNOSIS — I272 Pulmonary hypertension, unspecified: Secondary | ICD-10-CM | POA: Diagnosis present

## 2017-08-08 DIAGNOSIS — R809 Proteinuria, unspecified: Secondary | ICD-10-CM

## 2017-08-08 DIAGNOSIS — Z8546 Personal history of malignant neoplasm of prostate: Secondary | ICD-10-CM | POA: Diagnosis not present

## 2017-08-08 DIAGNOSIS — I25110 Atherosclerotic heart disease of native coronary artery with unstable angina pectoris: Secondary | ICD-10-CM | POA: Diagnosis present

## 2017-08-08 DIAGNOSIS — I130 Hypertensive heart and chronic kidney disease with heart failure and stage 1 through stage 4 chronic kidney disease, or unspecified chronic kidney disease: Principal | ICD-10-CM | POA: Diagnosis present

## 2017-08-08 DIAGNOSIS — E1122 Type 2 diabetes mellitus with diabetic chronic kidney disease: Secondary | ICD-10-CM | POA: Diagnosis present

## 2017-08-08 DIAGNOSIS — G4700 Insomnia, unspecified: Secondary | ICD-10-CM | POA: Diagnosis present

## 2017-08-08 DIAGNOSIS — I97638 Postprocedural hematoma of a circulatory system organ or structure following other circulatory system procedure: Secondary | ICD-10-CM | POA: Diagnosis not present

## 2017-08-08 DIAGNOSIS — Z9841 Cataract extraction status, right eye: Secondary | ICD-10-CM

## 2017-08-08 DIAGNOSIS — N184 Chronic kidney disease, stage 4 (severe): Secondary | ICD-10-CM

## 2017-08-08 DIAGNOSIS — N179 Acute kidney failure, unspecified: Secondary | ICD-10-CM | POA: Diagnosis present

## 2017-08-08 DIAGNOSIS — R0602 Shortness of breath: Secondary | ICD-10-CM | POA: Diagnosis not present

## 2017-08-08 DIAGNOSIS — E119 Type 2 diabetes mellitus without complications: Secondary | ICD-10-CM | POA: Diagnosis not present

## 2017-08-08 DIAGNOSIS — N189 Chronic kidney disease, unspecified: Secondary | ICD-10-CM | POA: Diagnosis not present

## 2017-08-08 DIAGNOSIS — E876 Hypokalemia: Secondary | ICD-10-CM | POA: Diagnosis present

## 2017-08-08 DIAGNOSIS — E785 Hyperlipidemia, unspecified: Secondary | ICD-10-CM | POA: Diagnosis not present

## 2017-08-08 DIAGNOSIS — I10 Essential (primary) hypertension: Secondary | ICD-10-CM | POA: Diagnosis present

## 2017-08-08 DIAGNOSIS — R609 Edema, unspecified: Secondary | ICD-10-CM | POA: Diagnosis not present

## 2017-08-08 DIAGNOSIS — N183 Chronic kidney disease, stage 3 (moderate): Secondary | ICD-10-CM | POA: Diagnosis not present

## 2017-08-08 DIAGNOSIS — Z7984 Long term (current) use of oral hypoglycemic drugs: Secondary | ICD-10-CM | POA: Diagnosis not present

## 2017-08-08 DIAGNOSIS — Z7982 Long term (current) use of aspirin: Secondary | ICD-10-CM

## 2017-08-08 DIAGNOSIS — I2729 Other secondary pulmonary hypertension: Secondary | ICD-10-CM | POA: Diagnosis present

## 2017-08-08 DIAGNOSIS — I5033 Acute on chronic diastolic (congestive) heart failure: Secondary | ICD-10-CM | POA: Diagnosis present

## 2017-08-08 DIAGNOSIS — Z9842 Cataract extraction status, left eye: Secondary | ICD-10-CM | POA: Diagnosis not present

## 2017-08-08 DIAGNOSIS — I2510 Atherosclerotic heart disease of native coronary artery without angina pectoris: Secondary | ICD-10-CM | POA: Diagnosis not present

## 2017-08-08 DIAGNOSIS — R0902 Hypoxemia: Secondary | ICD-10-CM | POA: Diagnosis not present

## 2017-08-08 DIAGNOSIS — E108 Type 1 diabetes mellitus with unspecified complications: Secondary | ICD-10-CM

## 2017-08-08 DIAGNOSIS — J90 Pleural effusion, not elsewhere classified: Secondary | ICD-10-CM | POA: Diagnosis not present

## 2017-08-08 DIAGNOSIS — I509 Heart failure, unspecified: Secondary | ICD-10-CM | POA: Diagnosis not present

## 2017-08-08 DIAGNOSIS — I5032 Chronic diastolic (congestive) heart failure: Secondary | ICD-10-CM | POA: Diagnosis present

## 2017-08-08 HISTORY — DX: Other disorders of plasma-protein metabolism, not elsewhere classified: E88.09

## 2017-08-08 HISTORY — DX: Edema, unspecified: R60.9

## 2017-08-08 HISTORY — DX: Chronic diastolic (congestive) heart failure: I50.32

## 2017-08-08 HISTORY — DX: Proteinuria, unspecified: R80.9

## 2017-08-08 HISTORY — DX: Chronic kidney disease, stage 4 (severe): N18.4

## 2017-08-08 LAB — CBC WITH DIFFERENTIAL/PLATELET
Abs Immature Granulocytes: 0 10*3/uL (ref 0.0–0.1)
Basophils Absolute: 0 10*3/uL (ref 0.0–0.1)
Basophils Relative: 0 %
Eosinophils Absolute: 0.3 10*3/uL (ref 0.0–0.7)
Eosinophils Relative: 3 %
HCT: 36.2 % — ABNORMAL LOW (ref 39.0–52.0)
Hemoglobin: 11.4 g/dL — ABNORMAL LOW (ref 13.0–17.0)
Immature Granulocytes: 0 %
Lymphocytes Relative: 15 %
Lymphs Abs: 1.6 10*3/uL (ref 0.7–4.0)
MCH: 29.8 pg (ref 26.0–34.0)
MCHC: 31.5 g/dL (ref 30.0–36.0)
MCV: 94.5 fL (ref 78.0–100.0)
Monocytes Absolute: 0.9 10*3/uL (ref 0.1–1.0)
Monocytes Relative: 8 %
Neutro Abs: 8.1 10*3/uL — ABNORMAL HIGH (ref 1.7–7.7)
Neutrophils Relative %: 74 %
Platelets: 244 10*3/uL (ref 150–400)
RBC: 3.83 MIL/uL — ABNORMAL LOW (ref 4.22–5.81)
RDW: 14.6 % (ref 11.5–15.5)
WBC: 10.9 10*3/uL — ABNORMAL HIGH (ref 4.0–10.5)

## 2017-08-08 LAB — BASIC METABOLIC PANEL
Anion gap: 11 (ref 5–15)
BUN: 24 mg/dL — ABNORMAL HIGH (ref 6–20)
CO2: 31 mmol/L (ref 22–32)
Calcium: 8.1 mg/dL — ABNORMAL LOW (ref 8.9–10.3)
Chloride: 99 mmol/L — ABNORMAL LOW (ref 101–111)
Creatinine, Ser: 3.01 mg/dL — ABNORMAL HIGH (ref 0.61–1.24)
GFR calc Af Amer: 21 mL/min — ABNORMAL LOW (ref >60)
GFR calc non Af Amer: 18 mL/min — ABNORMAL LOW (ref >60)
Glucose, Bld: 318 mg/dL — ABNORMAL HIGH (ref 65–99)
Potassium: 3 mmol/L — ABNORMAL LOW (ref 3.5–5.1)
Sodium: 141 mmol/L (ref 135–145)

## 2017-08-08 LAB — MAGNESIUM: Magnesium: 2.1 mg/dL (ref 1.7–2.4)

## 2017-08-08 LAB — TSH: TSH: 4.171 u[IU]/mL (ref 0.350–4.500)

## 2017-08-08 LAB — GLUCOSE, CAPILLARY
Glucose-Capillary: 348 mg/dL — ABNORMAL HIGH (ref 65–99)
Glucose-Capillary: 185 mg/dL — ABNORMAL HIGH (ref 65–99)

## 2017-08-08 LAB — BRAIN NATRIURETIC PEPTIDE: B Natriuretic Peptide: 653.2 pg/mL — ABNORMAL HIGH (ref 0.0–100.0)

## 2017-08-08 MED ORDER — ACETAMINOPHEN 325 MG PO TABS
650.0000 mg | ORAL_TABLET | ORAL | Status: DC | PRN
  Filled 2017-08-08: qty 2

## 2017-08-08 MED ORDER — INSULIN ASPART 100 UNIT/ML ~~LOC~~ SOLN
0.0000 [IU] | Freq: Three times a day (TID) | SUBCUTANEOUS | Status: DC
  Administered 2017-08-08: 7 [IU] via SUBCUTANEOUS
  Administered 2017-08-09: 2 [IU] via SUBCUTANEOUS
  Administered 2017-08-09: 3 [IU] via SUBCUTANEOUS
  Administered 2017-08-09 – 2017-08-10 (×2): 2 [IU] via SUBCUTANEOUS
  Administered 2017-08-10: 1 [IU] via SUBCUTANEOUS
  Administered 2017-08-10 – 2017-08-11 (×3): 2 [IU] via SUBCUTANEOUS
  Administered 2017-08-11: 3 [IU] via SUBCUTANEOUS
  Administered 2017-08-12 (×3): 2 [IU] via SUBCUTANEOUS
  Administered 2017-08-13: 3 [IU] via SUBCUTANEOUS
  Administered 2017-08-13 (×2): 2 [IU] via SUBCUTANEOUS
  Administered 2017-08-14: 3 [IU] via SUBCUTANEOUS
  Administered 2017-08-14: 1 [IU] via SUBCUTANEOUS
  Administered 2017-08-14 – 2017-08-15 (×2): 2 [IU] via SUBCUTANEOUS
  Administered 2017-08-15: 1 [IU] via SUBCUTANEOUS

## 2017-08-08 MED ORDER — SODIUM CHLORIDE 0.9% FLUSH
3.0000 mL | Freq: Two times a day (BID) | INTRAVENOUS | Status: DC
  Administered 2017-08-08 – 2017-08-15 (×10): 3 mL via INTRAVENOUS

## 2017-08-08 MED ORDER — POTASSIUM CHLORIDE CRYS ER 20 MEQ PO TBCR
40.0000 meq | EXTENDED_RELEASE_TABLET | Freq: Once | ORAL | Status: AC
  Administered 2017-08-08: 40 meq via ORAL
  Filled 2017-08-08: qty 2

## 2017-08-08 MED ORDER — FUROSEMIDE 10 MG/ML IJ SOLN
80.0000 mg | Freq: Three times a day (TID) | INTRAMUSCULAR | Status: AC
  Administered 2017-08-08 – 2017-08-13 (×16): 80 mg via INTRAVENOUS
  Filled 2017-08-08 (×16): qty 8

## 2017-08-08 MED ORDER — SODIUM CHLORIDE 0.9% FLUSH: 3.0000 mL | INTRAVENOUS | Status: DC | PRN

## 2017-08-08 MED ORDER — FOLIC ACID 1 MG PO TABS
1.0000 mg | ORAL_TABLET | Freq: Every day | ORAL | Status: DC
  Administered 2017-08-08 – 2017-08-15 (×8): 1 mg via ORAL
  Filled 2017-08-08 (×8): qty 1

## 2017-08-08 MED ORDER — COQ10 200 MG PO CAPS: 1.0000 | ORAL_CAPSULE | Freq: Every day | ORAL | Status: DC

## 2017-08-08 MED ORDER — GLIMEPIRIDE 4 MG PO TABS
6.0000 mg | ORAL_TABLET | Freq: Every day | ORAL | Status: DC
  Administered 2017-08-09 – 2017-08-15 (×7): 6 mg via ORAL
  Filled 2017-08-08 (×7): qty 2

## 2017-08-08 MED ORDER — ASPIRIN EC 81 MG PO TBEC
81.0000 mg | DELAYED_RELEASE_TABLET | Freq: Every day | ORAL | Status: DC
  Administered 2017-08-08 – 2017-08-15 (×8): 81 mg via ORAL
  Filled 2017-08-08 (×8): qty 1

## 2017-08-08 MED ORDER — TAB-A-VITE/IRON PO TABS
1.0000 | ORAL_TABLET | Freq: Every day | ORAL | Status: DC
  Administered 2017-08-09 – 2017-08-15 (×7): 1 via ORAL
  Filled 2017-08-08 (×7): qty 1

## 2017-08-08 MED ORDER — LINAGLIPTIN 5 MG PO TABS
5.0000 mg | ORAL_TABLET | ORAL | Status: DC
  Administered 2017-08-09 – 2017-08-15 (×7): 5 mg via ORAL
  Filled 2017-08-08 (×7): qty 1

## 2017-08-08 MED ORDER — ATORVASTATIN CALCIUM 40 MG PO TABS
40.0000 mg | ORAL_TABLET | Freq: Every day | ORAL | Status: DC
  Administered 2017-08-08 – 2017-08-14 (×7): 40 mg via ORAL
  Filled 2017-08-08 (×7): qty 1

## 2017-08-08 MED ORDER — SODIUM CHLORIDE 0.9% FLUSH
10.0000 mL | Freq: Two times a day (BID) | INTRAVENOUS | Status: DC
  Administered 2017-08-08 – 2017-08-15 (×10): 10 mL

## 2017-08-08 MED ORDER — SODIUM CHLORIDE 0.9 % IV SOLN: 250.0000 mL | INTRAVENOUS | Status: DC | PRN

## 2017-08-08 MED ORDER — POTASSIUM CHLORIDE CRYS ER 20 MEQ PO TBCR: 40.0000 meq | EXTENDED_RELEASE_TABLET | Freq: Every day | ORAL | 5 refills | Status: DC

## 2017-08-08 MED ORDER — POTASSIUM CHLORIDE CRYS ER 20 MEQ PO TBCR
40.0000 meq | EXTENDED_RELEASE_TABLET | Freq: Every day | ORAL | Status: DC
  Administered 2017-08-09: 40 meq via ORAL
  Filled 2017-08-08: qty 2

## 2017-08-08 MED ORDER — SODIUM CHLORIDE 0.9% FLUSH
10.0000 mL | INTRAVENOUS | Status: DC | PRN
  Administered 2017-08-10 – 2017-08-14 (×5): 10 mL
  Administered 2017-08-15: 40 mL
  Filled 2017-08-08 (×6): qty 40

## 2017-08-08 MED ORDER — AMLODIPINE BESYLATE 10 MG PO TABS
10.0000 mg | ORAL_TABLET | Freq: Every day | ORAL | Status: DC
  Administered 2017-08-08 – 2017-08-14 (×7): 10 mg via ORAL
  Filled 2017-08-08 (×7): qty 1

## 2017-08-08 MED ORDER — ONDANSETRON HCL 4 MG/2ML IJ SOLN: 4.0000 mg | Freq: Four times a day (QID) | INTRAMUSCULAR | Status: DC | PRN

## 2017-08-08 MED ORDER — HEPARIN SODIUM (PORCINE) 5000 UNIT/ML IJ SOLN
5000.0000 [IU] | Freq: Three times a day (TID) | INTRAMUSCULAR | Status: DC
Start: 2017-08-08 — End: 2017-08-15
  Administered 2017-08-08 – 2017-08-15 (×21): 5000 [IU] via SUBCUTANEOUS
  Filled 2017-08-08 (×21): qty 1

## 2017-08-08 MED ORDER — HYDRALAZINE HCL 50 MG PO TABS
100.0000 mg | ORAL_TABLET | Freq: Three times a day (TID) | ORAL | Status: DC
  Administered 2017-08-08 – 2017-08-15 (×21): 100 mg via ORAL
  Filled 2017-08-08 (×21): qty 2

## 2017-08-08 NOTE — H&P (Signed)
See below for pt's H/P - admitted by Jory Sims therefore note copied below.   -----  Cardiology Office Note   Date:  08/08/2017   ID:  [name], DOB 1933-11-14, MRN [id number]  PCP:  Jani Gravel, MD            Cardiologist: Dr. Debara Pickett     Chief Complaint  Patient presents with  . Hospitalization Follow-up  . Edema     History of Present Illness: [name] is a 84 y.o. male who presents for posthospitalization follow-up after admission for unstable angina.  The patient has a history of coronary artery disease with CABG x3 in the 1980s, aortic valve stenosis, CHF, chronic renal insufficiency, type 2 diabetes.  He was admitted for left heart catheterization, with precath hydration. He was discharged on 06/21/2017. Weight at that time 191 lbs.   Cardiac catheterization revealed double vessel CAD status post three-vessel CABG with 3 of 3 patent bypass grafts.  The patient was found to have stable disease and had  no indication for PCI with continued medical management recommended.  A right heart catheterization was also completed which demonstrated mild pulmonary hypertension.  Aortic valve revealed mild aortic stenosis with a peak to peak gradient of 4 mmHg.  The patient had post catheterization hydration as well.  Post catheterization the patient developed a small groin hematoma which was managed with additional compression.  The patient otherwise recovered well, without recurrent chest pain or dyspnea.  The patient was to have a follow-up BMET post hospitalization.  BMET on 06/25/2017 revealed hypokalemia with potassium of 3.2.  He was found to have elevated blood glucose of 282.  Has been seen by nephrology on 06/18/2017 and gliflozin for diabetes was discontinued. Amlodipine was started at 10 mg daily to take at HS.   He comes today having gained 24 lbs with extensive fluid overload. He has not been having response from po lasix. He is now sleeping in a recliner.  Most recent labs dated 07/05/2017 revealed creatinine of 2.41, potassium of 2.6. He has edema up into his thighs bilaterally, abdomen along with sacral edema.        Past Medical History:  Diagnosis Date  . Arthritis    "a little in LLE" (06/20/2017)  . CKD (chronic kidney disease), stage II    AKI on CKD2/notes 06/20/2017  . Coronary artery disease   . Dyslipidemia   . Hypertension   . Peripheral artery disease (HCC)    mild  . Pneumonia ~ 1950 X 1  . Prostate cancer ([place]) ~ 2015  . Type II diabetes mellitus ([place])          Past Surgical History:  Procedure Laterality Date  . CARDIAC CATHETERIZATION     "a couple times" (06/20/2017)  . CATARACT EXTRACTION W/ INTRAOCULAR LENS  IMPLANT, BILATERAL Bilateral   . CORONARY ARTERY BYPASS GRAFT  03/06/1985   LAD and first diagonal/circumflex (sequential saphenous vein graft,cardioplegia  . INSERTION PROSTATE RADIATION SEED  ~ 2015  . NM MYOCAR PERF WALL MOTION  09/12/2006   no ischemia  . RIGHT/LEFT HEART CATH AND CORONARY/GRAFT ANGIOGRAPHY N/A 06/21/2017   Procedure: RIGHT/LEFT HEART CATH AND CORONARY/GRAFT ANGIOGRAPHY;  Surgeon: Burnell Blanks, MD;  Location: [facility];  Service: Cardiovascular;  Laterality: N/A;  . US ECHOCARDIOGRAPHY  11/04/2008   trace TR & MR           Current Outpatient Medications  Medication Sig Dispense Refill  . amLODipine (  NORVASC) 10 MG tablet Take 10 mg by mouth at bedtime.    Marland Kitchen aspirin EC 81 MG tablet Take 81 mg by mouth daily.    Marland Kitchen atorvastatin (LIPITOR) 40 MG tablet Take 40 mg by mouth at bedtime.     . Coenzyme Q10 (COQ10) 200 MG CAPS Take 1 capsule by mouth daily.     . folic acid (FOLVITE) 1 MG tablet Take 1 mg by mouth daily.    . furosemide (LASIX) 40 MG tablet Take 40 mg by mouth 2 (two) times daily.     Marland Kitchen glimepiride (AMARYL) 4 MG tablet Take 6 mg by mouth daily with breakfast.    . glucose blood (ONE TOUCH ULTRA TEST) test strip TEST  1-2 TIMES PER DAY    . hydrALAZINE (APRESOLINE) 100 MG tablet Take 100 mg by mouth 3 (three) times daily.    . irbesartan (AVAPRO) 300 MG tablet Take 1 tablet (300 mg total) by mouth every morning. 30 tablet 5  . Lancets (ONETOUCH ULTRASOFT) lancets USE TO TEST BLOOD SUGAR 1-2 TIMES DAILY    . Multiple Vitamin (MULTIVITAMIN) tablet Take 3 tablets by mouth daily.    Marland Kitchen OVER THE COUNTER MEDICATION Place 1 spray into the nose daily as needed. Sovereign Silver Nasal Spray for Immune Support    . TRADJENTA 5 MG TABS tablet Take 5 mg by mouth every morning.     . vitamin E 400 UNIT capsule Take 400 Units by mouth daily.      No current facility-administered medications for this visit.     Allergies:   Patient has no known allergies.    Social History:  The patient  reports that he quit smoking about 44 years ago. His smoking use included cigarettes. He has a 20.00 pack-year smoking history. He has never used smokeless tobacco. He reports that he drank alcohol. He reports that he does not use drugs.   Family History:  The patient's family history includes Heart attack in his mother.    ROS: All other systems are reviewed and negative. Unless otherwise mentioned in H&P    PHYSICAL EXAM: VS:  BP 140/72   Pulse 60   Ht 5' 11.5" (1.816 m)   Wt 216 lb 9.6 oz (98.2 kg)   BMI 29.79 kg/m  , BMI Body mass index is 29.79 kg/m. GEN: Well nourished, well developed, in no acute distress  HEENT: normal  Neck: no JVD, carotid bruits, or masses Cardiac: RRR; heart sounds soft systolic murmur no , rubs, or gallops, 3+ bilateral edema to his thighs with sacral edema.  Respiratory:  clear to auscultation bilaterally, normal work of breathing GI: soft, nontender, distended, + BS  MS: no deformity or atrophy  Skin: warm and dry, no rash Neuro:  Strength and sensation are intact Psych: euthymic mood, full affect   EKG:  Not completed this office visit   Recent  Labs: 02/28/2017: NT-Pro BNP 1,277 06/21/2017: Hemoglobin 11.2; Platelets 163 06/25/2017: BUN 17; Creatinine, Ser 2.39; Potassium 3.2; Sodium 146    Lipid Panel Labs(Brief)  No results found for: CHOL, TRIG, HDL, CHOLHDL, VLDL, LDLCALC, LDLDIRECT         Wt Readings from Last 3 Encounters:  08/08/17 216 lb 9.6 oz (98.2 kg)  06/21/17 190 lb 1.6 oz (86.2 kg)  06/19/17 193 lb (87.5 kg)      Other studies Reviewed:  Echocardiogram 02/28/2017  - Left ventricle: The cavity size was normal. There was mild concentric hypertrophy. Systolic function was  vigorous. The estimated ejection fraction was in the range of 65% to 70%. Wall motion was normal; there were no regional wall motion abnormalities. Features are consistent with a pseudonormal left ventricular filling pattern, with concomitant abnormal relaxation and increased filling pressure (grade 2 diastolic dysfunction). Doppler parameters are consistent with high ventricular filling pressure. - Aortic valve: There was mild stenosis. There was no regurgitation. Peak velocity (S): 269 cm/s. Mean gradient (S): 16 mm Hg. - Mitral valve: Transvalvular velocity was within the normal range. There was no evidence for stenosis. There was mild regurgitation. - Left atrium: The atrium was moderately dilated. - Right ventricle: The cavity size was normal. Wall thickness was normal. Systolic function was normal. - Atrial septum: No defect or patent foramen ovale was identified. - Tricuspid valve: There was mild regurgitation. Peak RV-RA gradient (S): 47 mm Hg. - Pulmonary arteries: Systolic pressure was moderately increased. PA peak pressure: 50 mm Hg (S). - Pericardium, extracardiac: A small pericardial effusion was identified.  Cardiac Cath 06/21/2017 Conclusion     Prox RCA to Mid RCA lesion is 30% stenosed.  Mid RCA lesion is 40% stenosed.  Dist RCA lesion is 30% stenosed.  Ost LAD to Prox  LAD lesion is 100% stenosed.  LIMA graft was visualized by angiography and is normal in caliber.  Ost Cx to Prox Cx lesion is 90% stenosed.  Hemodynamic findings consistent with mild pulmonary hypertension.  LV end diastolic pressure is normal.  1. Severe double vessel CAD s/p 3V CABG with 3/3 patent bypass grafts  2. The LAD has 100% proximal occlusion. The entire LAD fills from the patent LIMA graft. The Diagonal branch fills from the vein graft 3. The Circumflex has 90% proximal stenosis. The vein graft fills the large obtuse marginal graft.  4. The RCA is a large dominant vessel with mild diffuse calcific plaque in the proximal, mid and distal vessel but no obstructive lesions.  5. The LIMA to the LAD is patent 6 The sequential saphenous vein graft to the Diagonal and OM is patent with mild filling defects seen in the proximal, mid and distal segments of the body of the graft. These lesions do not appear to be flow limiting.  7. Mild aortic stenosis. Peak to peak gradient 4 mmHg. The valve was easily crossed.  8. Right and left heart filling pressures outline above.     Recommendations: Continue medical management of CAD. Continue aggressive blood pressure control and diuresis for diastolic CHF.    ASSESSMENT AND PLAN:  1. Acute on Chronic Diastolic CHF: Multifactorial with significant renal insufficiency, diastolic dysfunction, not responding to OP po lasix. Weight of 17-20 lbs Patent will be admitted for IV diureses. He will need nephrology consultation for renal management. I have discussed the need for hospitalization with IV diuretics. The patient was initially reluctant but has now agreed to be admitted for IV diureses. He was found to be hypokalemic on labs 07/05/2017 and therefore will need repletion.   2. CKD Stage II-IV: GFR 24. He will need nephrology consult during admission  3. CAD: Hx of CABG with recent cath in June 21, 2017 with patent grafts. Normal LV  function with mildly elevated pulmonary pressure.   4. Diabetes: To be followed by PCP. May need Hospitalist consult.   Dr Martinique who is DOD on site today has seen and examined this patient and is in agreement that patient should be hospitalized.    Current medicines are reviewed at length with the patient today.  Labs/ tests ordered today include: NONE-ADMIT  Phill Myron. West Pugh, ANP, AACC   08/08/2017 2:23 PM    [facility]  [address], [place], [place] [address] Phone: [phone]; Fax: [phone]

## 2017-08-08 NOTE — Progress Notes (Signed)
Informed cardiology patient's oxygen was in the 60's on room air. On 4L now and stating 91. Will continue to monitor patient.

## 2017-08-08 NOTE — Patient Instructions (Signed)
[facility] ADMITT[facility] WILL BE CALLING YOUR CELL PHONE SO MAKE SURE TO KEEP IT CLOSE TO YOU.   GOOD LUCK!!

## 2017-08-08 NOTE — Progress Notes (Signed)
Was asked to help facilitate admission orders by Jory Sims NP. Per her direction, she recommends labs, IV Lasix 80mg  q8hours, continue home meds except ARB, adding SSI, and calling nephrology in AM. She also spoke with call APP Cecilie Kicks who was called about pt's O2 and also knows to look out for the labs that were entered. Per [name], no need for repeat echo or troponins. Pt indicates the only meds he took today were Lasix 80mg  orally, KCl, 2 tablets and tradjenta. Jaycob Mcclenton PA-C

## 2017-08-09 ENCOUNTER — Inpatient Hospital Stay (HOSPITAL_COMMUNITY): Payer: Medicare Other

## 2017-08-09 DIAGNOSIS — N184 Chronic kidney disease, stage 4 (severe): Secondary | ICD-10-CM

## 2017-08-09 DIAGNOSIS — R39198 Other difficulties with micturition: Secondary | ICD-10-CM

## 2017-08-09 LAB — BASIC METABOLIC PANEL
Anion gap: 12 (ref 5–15)
BUN: 24 mg/dL — ABNORMAL HIGH (ref 6–20)
CO2: 31 mmol/L (ref 22–32)
Calcium: 7.8 mg/dL — ABNORMAL LOW (ref 8.9–10.3)
Chloride: 101 mmol/L (ref 101–111)
Creatinine, Ser: 3.11 mg/dL — ABNORMAL HIGH (ref 0.61–1.24)
GFR calc Af Amer: 20 mL/min — ABNORMAL LOW (ref >60)
GFR calc non Af Amer: 17 mL/min — ABNORMAL LOW (ref >60)
Glucose, Bld: 156 mg/dL — ABNORMAL HIGH (ref 65–99)
Potassium: 3.2 mmol/L — ABNORMAL LOW (ref 3.5–5.1)
Sodium: 144 mmol/L (ref 135–145)

## 2017-08-09 LAB — URINALYSIS, ROUTINE W REFLEX MICROSCOPIC
Bacteria, UA: NONE SEEN
Bilirubin Urine: NEGATIVE
Glucose, UA: 50 mg/dL — AB
Hgb urine dipstick: NEGATIVE
Ketones, ur: NEGATIVE mg/dL
Leukocytes, UA: NEGATIVE
Nitrite: NEGATIVE
Protein, ur: 100 mg/dL — AB
Specific Gravity, Urine: 1.006 (ref 1.005–1.030)
pH: 9 — ABNORMAL HIGH (ref 5.0–8.0)

## 2017-08-09 LAB — GLUCOSE, CAPILLARY
Glucose-Capillary: 173 mg/dL — ABNORMAL HIGH (ref 65–99)
Glucose-Capillary: 189 mg/dL — ABNORMAL HIGH (ref 65–99)
Glucose-Capillary: 230 mg/dL — ABNORMAL HIGH (ref 65–99)
Glucose-Capillary: 148 mg/dL — ABNORMAL HIGH (ref 65–99)

## 2017-08-09 LAB — PROTEIN / CREATININE RATIO, URINE
Creatinine, Urine: 34.96 mg/dL
Protein Creatinine Ratio: 5.06 mg/mg{Cre} — ABNORMAL HIGH (ref 0.00–0.15)
Total Protein, Urine: 177 mg/dL

## 2017-08-09 LAB — HEMOGLOBIN A1C
Hgb A1c MFr Bld: 8.6 % — ABNORMAL HIGH (ref 4.8–5.6)
Mean Plasma Glucose: 200.12 mg/dL

## 2017-08-09 MED ORDER — POTASSIUM CHLORIDE CRYS ER 20 MEQ PO TBCR: 40.0000 meq | EXTENDED_RELEASE_TABLET | Freq: Two times a day (BID) | ORAL | Status: DC

## 2017-08-09 MED ORDER — POTASSIUM CHLORIDE CRYS ER 20 MEQ PO TBCR
40.0000 meq | EXTENDED_RELEASE_TABLET | Freq: Three times a day (TID) | ORAL | Status: DC
  Administered 2017-08-09 – 2017-08-14 (×15): 40 meq via ORAL
  Filled 2017-08-09 (×16): qty 2

## 2017-08-09 NOTE — Consult Note (Signed)
[facility] Consultation Note Requesting Physician: Dr. Debara Pickett Primary Nephrologist: Dr. Hollie Salk Reason for Consult:  AKI on CKD3b/4  HPI: The patient is a 84 y.o. year-old with PMH HTN, HLD, CAD (cath 05/2017 patent bypass grafts; normal EF by ECHO 02/2017) , AS (mild), DM2, OA. Follows with Dr. Rodman Key for CKD3/4. Last seen in April by Dr. Hollie Salk. Had labs at [facility] 08/01/2017 with creatinine 2.41 GFR 24 (2.27 07/05/17). Admitted to the hospital on the current occasion with several weeks of worsening edema, exacerbation of dCHF. Creatinine was noted to be 3, we are asked to see. Because of sx of weak urinary stream, foley is being placed today for UOP monitoring, esp with need for aggressive diuresis.   Office weight at [facility] 05/2017 193, weight on admission 214 lb. UOP 1.5 liters past 24 hours on lasix 80 mg IV every 8 hours. (home lasix dose 40 mg BID had stopped responding to that).  He describes an insidious increase in fluid/swelling past few months, even preceding his heart cath in April (and CXR notably at that time demonstrated small bilateral effusions). Has h/o proteinuria (around 2 gm 05/2016 not recently re quantitated)  Creatinine, Ser  Date/Time Value  08/09/2017 04:29 AM 3.11 (H)  08/08/2017 05:35 PM 3.01 (H)  08/01/17 2.41 GFR 24 ([facility])  05/09.2019 2.27 GFR 26 ([facility])  06/25/2017 10:25 AM 2.39 (H)  06/21/2017 03:57 AM 2.23 (H)  06/19/2017 09:40 AM 2.07 (H)  03/16/2017 08:45 AM 2.46 (H)  02/28/2017 08:54 AM 2.17 (H)  04/30/2009 08:35 AM 1.53 (H)  04/03/2007 08:29 PM 1.25    Past Medical History:  Diagnosis Date  . Arthritis    "a little in LLE" (06/20/2017)  . CKD (chronic kidney disease), stage II    AKI on CKD2/notes 06/20/2017  . Coronary artery disease   . Dyslipidemia   . Edema 08/08/2017   HANDS & LOWER EXTREMITES  . Hypertension   . Peripheral artery disease (HCC)    mild  . Pneumonia ~ 1950 X 1  . Prostate cancer ([place]) ~ 2015  . Type II diabetes mellitus ([place])      Past Surgical History:  Procedure Laterality Date  . CARDIAC CATHETERIZATION     "a couple times" (06/20/2017)  . CATARACT EXTRACTION W/ INTRAOCULAR LENS  IMPLANT, BILATERAL Bilateral   . CORONARY ARTERY BYPASS GRAFT  03/06/1985   LAD and first diagonal/circumflex (sequential saphenous vein graft,cardioplegia  . INSERTION PROSTATE RADIATION SEED  ~ 2015  . NM MYOCAR PERF WALL MOTION  09/12/2006   no ischemia  . RIGHT/LEFT HEART CATH AND CORONARY/GRAFT ANGIOGRAPHY N/A 06/21/2017   Procedure: RIGHT/LEFT HEART CATH AND CORONARY/GRAFT ANGIOGRAPHY;  Surgeon: Burnell Blanks, MD;  Location: [facility];  Service: Cardiovascular;  Laterality: N/A;  . US ECHOCARDIOGRAPHY  11/04/2008   trace TR & MR    Family History  Problem Relation Age of Onset  . Heart attack Mother    Social History:  reports that he quit smoking about 44 years ago. His smoking use included cigarettes. He has a 20.00 pack-year smoking history. He has never used smokeless tobacco. He reports that he drank alcohol. He reports that he does not use drugs.  Allergies: No Known Allergies  Home medications: Prior to Admission medications   Medication Sig Start Date End Date Taking? Authorizing Provider  amLODipine (NORVASC) 10 MG tablet Take 10 mg by mouth at bedtime.   Yes [provider]  aspirin EC 81 MG tablet Take  81 mg by mouth daily.   Yes [provider]  atorvastatin (LIPITOR) 40 MG tablet Take 40 mg by mouth at bedtime.  08/09/12  Yes [provider]  BYSTOLIC 10 MG tablet Take 10 mg by mouth daily. 07/18/17  Yes [provider]  carboxymethylcellulose (REFRESH PLUS) 0.5 % SOLN Place 1 drop into both eyes 3 (three) times daily as needed (dry, itch).   Yes [provider]  furosemide (LASIX) 40 MG tablet Take 40 mg by mouth 2 (two) times daily.    Yes [provider]  glimepiride (AMARYL) 4 MG tablet Take 6 mg by mouth daily with breakfast.   Yes  [provider]  hydrALAZINE (APRESOLINE) 100 MG tablet Take 100 mg by mouth 3 (three) times daily.   Yes [provider]  irbesartan (AVAPRO) 300 MG tablet Take 1 tablet (300 mg total) by mouth every morning. 02/28/17  Yes Hilty, Nadean Corwin, MD  Multiple Vitamin (MULTIVITAMIN) tablet Take 1 tablet by mouth daily.    Yes [provider]  OVER THE COUNTER MEDICATION Place 1 spray into the nose daily as needed. Sovereign Silver Nasal Spray for Immune Support   Yes [provider]  potassium chloride SA (KLOR-CON M20) 20 MEQ tablet Take 2 tablets (40 mEq total) by mouth daily. 08/08/17 11/06/17 Yes Lendon Colonel, NP  TRADJENTA 5 MG TABS tablet Take 5 mg by mouth every morning.  02/27/17  Yes [provider]  traZODone (DESYREL) 50 MG tablet Take 50 mg by mouth at bedtime. mg 07/12/17  Yes [provider]  folic acid (FOLVITE) 1 MG tablet Take 1 mg by mouth daily.    [provider]    Inpatient medications: . amLODipine  10 mg Oral QHS  . aspirin EC  81 mg Oral Daily  . atorvastatin  40 mg Oral QHS  . folic acid  1 mg Oral Daily  . furosemide  80 mg Intravenous Q8H  . glimepiride  6 mg Oral Q breakfast  . heparin  5,000 Units Subcutaneous Q8H  . hydrALAZINE  100 mg Oral TID  . insulin aspart  0-9 Units Subcutaneous TID WC  . linagliptin  5 mg Oral [id number]  . multivitamins with iron  1 tablet Oral Daily  . potassium chloride SA  40 mEq Oral TID  . sodium chloride flush  10-40 mL Intracatheter Q12H  . sodium chloride flush  3 mL Intravenous Q12H    Review of Systems + Progressive swelling for several months + swelling of arms, legs, hands, face + DOE walking to door + difficulty urinating (pushing so hard having simultaneous BM's)  No rash, arthralgias, fevers, chills, hematuria  Physical Exam:  Blood pressure 139/63, pulse 61, temperature 98.8 F (37.1 C), temperature source Oral, resp. rate 18, height 5' 11.5" (1.816 m),  weight 97.1 kg (214 lb 1.6 oz), SpO2 95 %.  Very nice elderly AAM Gross anasarca + Periorbital edema + pitting edema arms, legs, abdominal wall Lungs diminished BS Crackles both bases No wheezes S1S2 No S3 Abd protuberant, not tender, abd wall edema 4+ pitting LE's to knees, 2+ to hips   Recent Labs  Lab 08/08/17 1735 08/09/17 0429  NA 141 144  K 3.0* 3.2*  CL 99* 101  CO2 31 31  GLUCOSE 318* 156*  BUN 24* 24*  CREATININE 3.01* 3.11*  CALCIUM 8.1* 7.8*    Recent Labs  Lab 08/08/17 1735  WBC 10.9*  NEUTROABS 8.1*  HGB 11.4*  HCT 36.2*  MCV 94.5  PLT 244    Recent Labs  Lab 08/08/17 1712 08/08/17 2109 08/09/17 0721 08/09/17 1123  GLUCAP 348* 185* 173* 189*    Xrays/Other Studies: Dg Chest 2 View  Result Date: 08/09/2017 CLINICAL DATA:  CHF. EXAM: CHEST - 2 VIEW COMPARISON:  06/20/2017. FINDINGS: Prior median sternotomy. Cardiomegaly. Diffuse bilateral pulmonary infiltrates/edema. Interim progression from prior exam. Atelectatic changes right lung base. Right-sided pleural effusion again noted. No pneumothorax. IMPRESSION: 1. Progressive bilateral pulmonary infiltrates and or edema. Persistent right-sided pleural effusion. 2.  Right base atelectatic changes. 3.  Prior median sternotomy and CABG.  Cardiomegaly. Electronically Signed   By: Marcello Moores  Register   On: 08/09/2017 07:54    Background: 84 y.o. year-old with PMH HTN, HLD, CAD (cath 05/2017 patent bypass grafts; normal EF by ECHO 02/2017) , AS (mild), DM2, OA. Follows with Dr. Rodman Key for CKD3/4. Last seen in April by Dr. Hollie Salk creatinine around 2. Had labs at [facility] 08/01/2017 with creatinine 2.41 GFR 24 (2.27 07/05/17). Admitted to the hospital on the current occasion with several weeks of worsening edema, 20 lb weight gain, exacerbation of dCHF. Creatinine was noted to be 3 on admission, we are asked to see.   Assessment/Recommendations  1. AKI on CKD3/4 - with gross (very impressive) anasarca.   Worsening creatinine noted in setting of decompensated HF. Has been stuttering up since 05/2017 (looking at [facility] + office labs).   1. Based on outpt creatinine trending prior to admission, this could be AKI occurring as a consequence of the decompensated dCHF rather than the other way around. If that assumption bears out, may actually see improvement in renal function with aggressive diuresis.  2. He has had proteinuric disease, last UPC on our chart from 05/2016 ~2 gm proteinuria. Will check albumin, repeat UA and UPC here - if he has developed nephrotic syndrome could easily account for development of progressive anasarca.  3. Doubt that April heart cath played a role (specifically doubt stuttering course of creatinine related to atheroembolic syndrome as he describes fluid retention and swelling that preceded the cath.  4. Agree with placement of foley given age, difficulty urinating (says got to point where he had to push so hard to pass urine that he would have BM at same time) for relief of any possible BOO as well as to monitor diuresis.  2. H/O prostate CA with slow urinary stream. Agree with foley placement 3. Acute on chronic dCHF agree with diuresis 4. Hypokalemia -  K 3 and I see that 40 TID ordered. Just need to be careful not to overshoot with this degree of CKD.  Dr. Joelyn Oms will follow up in the AM   Jamal Maes,  MD [facility] [phone] pager 08/09/2017, 12:47 PM

## 2017-08-09 NOTE — Progress Notes (Signed)
Progress Note  Patient Name: [name] Date of Encounter: 08/09/2017  Primary Cardiologist: Pixie Casino, MD   Subjective   Breathing has improved with diuresis, remains on 2L [place]. Reports good UOP. Denies chest pain.   Inpatient Medications    Scheduled Meds: . amLODipine  10 mg Oral QHS  . aspirin EC  81 mg Oral Daily  . atorvastatin  40 mg Oral QHS  . folic acid  1 mg Oral Daily  . furosemide  80 mg Intravenous Q8H  . glimepiride  6 mg Oral Q breakfast  . heparin  5,000 Units Subcutaneous Q8H  . hydrALAZINE  100 mg Oral TID  . insulin aspart  0-9 Units Subcutaneous TID WC  . linagliptin  5 mg Oral BH-q7a  . multivitamins with iron  1 tablet Oral Daily  . potassium chloride SA  40 mEq Oral Daily  . sodium chloride flush  10-40 mL Intracatheter Q12H  . sodium chloride flush  3 mL Intravenous Q12H   Continuous Infusions: . sodium chloride     PRN Meds: sodium chloride, acetaminophen, ondansetron (ZOFRAN) IV, sodium chloride flush, sodium chloride flush   Vital Signs    Vitals:   08/08/17 2001 08/09/17 0032 08/09/17 0453 08/09/17 0837  BP:  (!) 150/84 (!) 146/57 (!) 145/78  Pulse: 80 77 71 76  Resp:  20 20   Temp:  99.6 F (37.6 C) 99.8 F (37.7 C) 98.8 F (37.1 C)  TempSrc:   Oral Oral  SpO2: 100% 100% 100% 98%  Weight:   214 lb 1.6 oz (97.1 kg)   Height:        Intake/Output Summary (Last 24 hours) at 08/09/2017 0856 Last data filed at 08/09/2017 0842 Gross per 24 hour  Intake 250 ml  Output 1525 ml  Net -1275 ml   Filed Weights   08/08/17 1714 08/09/17 0453  Weight: 217 lb (98.4 kg) 214 lb 1.6 oz (97.1 kg)    Telemetry    NSR with PVCs - Personally Reviewed  ECG    Sinus with sinus arrhythmia  - Personally Reviewed  Physical Exam   GEN: No acute distress.   Neck: JVD to mandible  Cardiac: RRR, no murmurs, rubs, or gallops.  Respiratory: Bibasilar crackles GI: Soft, nontender, non-distended  MS: Diffuse anasarca with 3+ pitting  edema of lower extremities, edema to lower abdomen  Neuro:  Nonfocal  Psych: Normal affect   Labs    Chemistry Recent Labs  Lab 08/08/17 1735 08/09/17 0429  NA 141 144  K 3.0* 3.2*  CL 99* 101  CO2 31 31  GLUCOSE 318* 156*  BUN 24* 24*  CREATININE 3.01* 3.11*  CALCIUM 8.1* 7.8*  GFRNONAA 18* 17*  GFRAA 21* 20*  ANIONGAP 11 12     Hematology Recent Labs  Lab 08/08/17 1735  WBC 10.9*  RBC 3.83*  HGB 11.4*  HCT 36.2*  MCV 94.5  MCH 29.8  MCHC 31.5  RDW 14.6  PLT 244    Cardiac EnzymesNo results for input(s): TROPONINI in the last 168 hours. No results for input(s): TROPIPOC in the last 168 hours.   BNP Recent Labs  Lab 08/08/17 1735  BNP 653.2*     DDimer No results for input(s): DDIMER in the last 168 hours.   Radiology    Dg Chest 2 View  Result Date: 08/09/2017 CLINICAL DATA:  CHF. EXAM: CHEST - 2 VIEW COMPARISON:  06/20/2017. FINDINGS: Prior median sternotomy. Cardiomegaly. Diffuse bilateral pulmonary infiltrates/edema.  Interim progression from prior exam. Atelectatic changes right lung base. Right-sided pleural effusion again noted. No pneumothorax. IMPRESSION: 1. Progressive bilateral pulmonary infiltrates and or edema. Persistent right-sided pleural effusion. 2.  Right base atelectatic changes. 3.  Prior median sternotomy and CABG.  Cardiomegaly. Electronically Signed   By: Marcello Moores  Register   On: 08/09/2017 07:54    Cardiac Studies   06/21/2017 Right & Left Heart Cath:   Prox RCA to Mid RCA lesion is 30% stenosed.  Mid RCA lesion is 40% stenosed.  Dist RCA lesion is 30% stenosed.  Ost LAD to Prox LAD lesion is 100% stenosed.  LIMA graft was visualized by angiography and is normal in caliber.  Ost Cx to Prox Cx lesion is 90% stenosed.  Hemodynamic findings consistent with mild pulmonary hypertension.  LV end diastolic pressure is normal.   1. Severe double vessel CAD s/p 3V CABG with 3/3 patent bypass grafts  2. The LAD has 100% proximal  occlusion. The entire LAD fills from the patent LIMA graft. The Diagonal branch fills from the vein graft 3. The Circumflex has 90% proximal stenosis. The vein graft fills the large obtuse marginal graft.  4. The RCA is a large dominant vessel with mild diffuse calcific plaque in the proximal, mid and distal vessel but no obstructive lesions.  5. The LIMA to the LAD is patent 6 The sequential saphenous vein graft to the Diagonal and OM is patent with mild filling defects seen in the proximal, mid and distal segments of the body of the graft. These lesions do not appear to be flow limiting.  7. Mild aortic stenosis. Peak to peak gradient 4 mmHg. The valve was easily crossed.  8. Right and left heart filling pressures outline above.   Recommendations: Continue medical management of CAD. Continue aggressive blood pressure control and diuresis for diastolic CHF. Will hydrate for 6 hours post cath and plan discharge home later today.   02/28/2017 Echocardiogram: Study Conclusions - Left ventricle: The cavity size was normal. There was mild   concentric hypertrophy. Systolic function was vigorous. The   estimated ejection fraction was in the range of 65% to 70%. Wall   motion was normal; there were no regional wall motion   abnormalities. Features are consistent with a pseudonormal left   ventricular filling pattern, with concomitant abnormal relaxation   and increased filling pressure (grade 2 diastolic dysfunction).   Doppler parameters are consistent with high ventricular filling   pressure. - Aortic valve: There was mild stenosis. There was no   regurgitation. Peak velocity (S): 269 cm/s. Mean gradient (S): 16   mm Hg. - Mitral valve: Transvalvular velocity was within the normal range.   There was no evidence for stenosis. There was mild regurgitation. - Left atrium: The atrium was moderately dilated. - Right ventricle: The cavity size was normal. Wall thickness was   normal. Systolic  function was normal. - Atrial septum: No defect or patent foramen ovale was identified. - Tricuspid valve: There was mild regurgitation. Peak RV-RA   gradient (S): 47 mm Hg. - Pulmonary arteries: Systolic pressure was moderately increased.   PA peak pressure: 50 mm Hg (S). - Pericardium, extracardiac: A small pericardial effusion was   identified.  Patient Profile     84 y.o. male with pmhx of dCHF, CAD s/p CABG (1980s), mild AV stenosis, CKD IV and diabetes admitted directly from cardiology clinic with 24 lb weight gain, anasarca to his umbilicus, and hypoxia to 60s on RA.  Assessment & Plan    Acute on Chronic Diastolic Heart Failure: Patient presented with gross hypervolemia and acute on chronic renal dysfunction. Recent admission for unstable angina in April of 2019. Has a history of CABG x 3 in the 1980s. Underwent R&LHC during recent hospitalization with 3 patent grafts, 2 vessel disease. He was managed medically. Was seen yesterday 6/12 for hospital follow up and found to be hypoxic to 60s and grossly volume overloaded. Directly admitted from clinic. He has been receiving IV lasix 80 mg q8h since admission. UOP has totaled 1.5L over the past 24 hours, recorded Ins look inaccurate. Weight is down 3lbs.  -- Continue IV lasix 80 TID  -- Strict I/Os, daily weights -- fluid restrict   Acute on Chronic Renal Failure: CKD IV with baseline Scr ~ 2.2-2.4 and GFR 30. Admission labs with Scr 3.01 / GFR 20, unchanged this morning. -- Continue diuretics above -- Appreciate nephrology consult -- Continue amlodipine 10 mg QHS -- Continue Hydralazine 100 mg TID  Hypokalemia: S/p 40 mEq x 2 yesterday evening and x1 this AM. Potassium 3.0 -> 3.2 today.  -- Increase K-dur to 40 mEq TID  CAD: S/p CABG with recent cath in April 2019, grafts were patent. Normal LVED pressure and mildly elevated pulmonary pressure.  -- Continue ASA 81 and Lipitor 40  Diabetes: No A1c in system. Takes glimepiride 6  mg qAM and linagliptin 5 mg daily at home. CBGs since admission range 150s - 180s -- Check A1c  -- Continue Glimepiride & linagliptin  -- SSI - sen TID AC  For questions or updates, please contact [facility] Please consult [email or web address] for contact info under Cardiology/STEMI.   Signed, Velna Ochs, MD  08/09/2017, 8:56 AM

## 2017-08-09 NOTE — Progress Notes (Signed)
Pecolia Ades NP called back stated to hold off on foley but continue to monitor pt. Primary RN aware and will re scan pt for retention.

## 2017-08-09 NOTE — Progress Notes (Signed)
Paged NP regarding foley order  Pecolia Ades called back stated to hold off on foley insertion  NP aware CN had discussion with pt, he stated he had a prostate implant and voids normally and feels that he is voiding okay in hospital. Pt states he hasn't had a foley "in a long time." CN bladder scanned pt, he had 217ml retaining.  NP stated she will call back with order clarification

## 2017-08-10 DIAGNOSIS — R809 Proteinuria, unspecified: Secondary | ICD-10-CM

## 2017-08-10 LAB — RENAL FUNCTION PANEL
Albumin: 1.7 g/dL — ABNORMAL LOW (ref 3.5–5.0)
Anion gap: 6 (ref 5–15)
BUN: 23 mg/dL — ABNORMAL HIGH (ref 6–20)
CO2: 31 mmol/L (ref 22–32)
Calcium: 7.7 mg/dL — ABNORMAL LOW (ref 8.9–10.3)
Chloride: 106 mmol/L (ref 101–111)
Creatinine, Ser: 3.11 mg/dL — ABNORMAL HIGH (ref 0.61–1.24)
GFR calc Af Amer: 20 mL/min — ABNORMAL LOW (ref >60)
GFR calc non Af Amer: 17 mL/min — ABNORMAL LOW (ref >60)
Glucose, Bld: 179 mg/dL — ABNORMAL HIGH (ref 65–99)
Phosphorus: 2.8 mg/dL (ref 2.5–4.6)
Potassium: 3.4 mmol/L — ABNORMAL LOW (ref 3.5–5.1)
Sodium: 143 mmol/L (ref 135–145)

## 2017-08-10 LAB — GLUCOSE, CAPILLARY
Glucose-Capillary: 169 mg/dL — ABNORMAL HIGH (ref 65–99)
Glucose-Capillary: 164 mg/dL — ABNORMAL HIGH (ref 65–99)
Glucose-Capillary: 142 mg/dL — ABNORMAL HIGH (ref 65–99)
Glucose-Capillary: 184 mg/dL — ABNORMAL HIGH (ref 65–99)

## 2017-08-10 MED ORDER — TRAZODONE HCL 50 MG PO TABS
50.0000 mg | ORAL_TABLET | Freq: Every day | ORAL | Status: DC
  Administered 2017-08-10 – 2017-08-14 (×5): 50 mg via ORAL
  Filled 2017-08-10 (×5): qty 1

## 2017-08-10 NOTE — Progress Notes (Signed)
Progress Note  Patient Name: [name] Date of Encounter: 08/10/2017  Primary Cardiologist: Pixie Casino, MD   Subjective   Continues to have some shortness of breath, on 2L [place]. Reports minimal improvement in swelling. Complaining of difficulty sleeping.   Inpatient Medications    Scheduled Meds: . amLODipine  10 mg Oral QHS  . aspirin EC  81 mg Oral Daily  . atorvastatin  40 mg Oral QHS  . folic acid  1 mg Oral Daily  . furosemide  80 mg Intravenous Q8H  . glimepiride  6 mg Oral Q breakfast  . heparin  5,000 Units Subcutaneous Q8H  . hydrALAZINE  100 mg Oral TID  . insulin aspart  0-9 Units Subcutaneous TID WC  . linagliptin  5 mg Oral BH-q7a  . multivitamins with iron  1 tablet Oral Daily  . potassium chloride SA  40 mEq Oral TID  . sodium chloride flush  10-40 mL Intracatheter Q12H  . sodium chloride flush  3 mL Intravenous Q12H   Continuous Infusions: . sodium chloride     PRN Meds: sodium chloride, acetaminophen, ondansetron (ZOFRAN) IV, sodium chloride flush, sodium chloride flush   Vital Signs    Vitals:   08/09/17 1127 08/09/17 1601 08/09/17 2118 08/10/17 0501  BP: 139/63 (!) 146/66 (!) 148/70 (!) 160/70  Pulse: 61 71 70 74  Resp: 18  16 20   Temp: 98.8 F (37.1 C)  98.3 F (36.8 C) 98.5 F (36.9 C)  TempSrc: Oral  Oral Oral  SpO2: 95% 93% 94% 93%  Weight:    207 lb 6.4 oz (94.1 kg)  Height:        Intake/Output Summary (Last 24 hours) at 08/10/2017 1153 Last data filed at 08/10/2017 1002 Gross per 24 hour  Intake 373 ml  Output 4460 ml  Net -4087 ml   Filed Weights   08/08/17 1714 08/09/17 0453 08/10/17 0501  Weight: 217 lb (98.4 kg) 214 lb 1.6 oz (97.1 kg) 207 lb 6.4 oz (94.1 kg)    Telemetry    NSR with PVCs - Personally Reviewed  ECG    Sinus with sinus arrhythmia  - Personally Reviewed  Physical Exam   GEN: No acute distress.   Neck: JVD to mandible  Cardiac: RRR, no murmurs, rubs, or gallops.  Respiratory: Bibasilar  crackles GI: Soft, nontender, non-distended  MS: Diffuse anasarca with 3+ pitting edema of lower extremities, edema to lower abdomen  Neuro:  Nonfocal  Psych: Normal affect   Labs    Chemistry Recent Labs  Lab 08/08/17 1735 08/09/17 0429 08/10/17 0413  NA 141 144 143  K 3.0* 3.2* 3.4*  CL 99* 101 106  CO2 31 31 31   GLUCOSE 318* 156* 179*  BUN 24* 24* 23*  CREATININE 3.01* 3.11* 3.11*  CALCIUM 8.1* 7.8* 7.7*  ALBUMIN  --   --  1.7*  GFRNONAA 18* 17* 17*  GFRAA 21* 20* 20*  ANIONGAP 11 12 6      Hematology Recent Labs  Lab 08/08/17 1735  WBC 10.9*  RBC 3.83*  HGB 11.4*  HCT 36.2*  MCV 94.5  MCH 29.8  MCHC 31.5  RDW 14.6  PLT 244    Cardiac EnzymesNo results for input(s): TROPONINI in the last 168 hours. No results for input(s): TROPIPOC in the last 168 hours.   BNP Recent Labs  Lab 08/08/17 1735  BNP 653.2*     DDimer No results for input(s): DDIMER in the last 168 hours.  Radiology    Dg Chest 2 View  Result Date: 08/09/2017 CLINICAL DATA:  CHF. EXAM: CHEST - 2 VIEW COMPARISON:  06/20/2017. FINDINGS: Prior median sternotomy. Cardiomegaly. Diffuse bilateral pulmonary infiltrates/edema. Interim progression from prior exam. Atelectatic changes right lung base. Right-sided pleural effusion again noted. No pneumothorax. IMPRESSION: 1. Progressive bilateral pulmonary infiltrates and or edema. Persistent right-sided pleural effusion. 2.  Right base atelectatic changes. 3.  Prior median sternotomy and CABG.  Cardiomegaly. Electronically Signed   By: Marcello Moores  Register   On: 08/09/2017 07:54    Cardiac Studies   06/21/2017 Right & Left Heart Cath:   Prox RCA to Mid RCA lesion is 30% stenosed.  Mid RCA lesion is 40% stenosed.  Dist RCA lesion is 30% stenosed.  Ost LAD to Prox LAD lesion is 100% stenosed.  LIMA graft was visualized by angiography and is normal in caliber.  Ost Cx to Prox Cx lesion is 90% stenosed.  Hemodynamic findings consistent with  mild pulmonary hypertension.  LV end diastolic pressure is normal.   1. Severe double vessel CAD s/p 3V CABG with 3/3 patent bypass grafts  2. The LAD has 100% proximal occlusion. The entire LAD fills from the patent LIMA graft. The Diagonal branch fills from the vein graft 3. The Circumflex has 90% proximal stenosis. The vein graft fills the large obtuse marginal graft.  4. The RCA is a large dominant vessel with mild diffuse calcific plaque in the proximal, mid and distal vessel but no obstructive lesions.  5. The LIMA to the LAD is patent 6 The sequential saphenous vein graft to the Diagonal and OM is patent with mild filling defects seen in the proximal, mid and distal segments of the body of the graft. These lesions do not appear to be flow limiting.  7. Mild aortic stenosis. Peak to peak gradient 4 mmHg. The valve was easily crossed.  8. Right and left heart filling pressures outline above.   Recommendations: Continue medical management of CAD. Continue aggressive blood pressure control and diuresis for diastolic CHF. Will hydrate for 6 hours post cath and plan discharge home later today.   02/28/2017 Echocardiogram: Study Conclusions - Left ventricle: The cavity size was normal. There was mild   concentric hypertrophy. Systolic function was vigorous. The   estimated ejection fraction was in the range of 65% to 70%. Wall   motion was normal; there were no regional wall motion   abnormalities. Features are consistent with a pseudonormal left   ventricular filling pattern, with concomitant abnormal relaxation   and increased filling pressure (grade 2 diastolic dysfunction).   Doppler parameters are consistent with high ventricular filling   pressure. - Aortic valve: There was mild stenosis. There was no   regurgitation. Peak velocity (S): 269 cm/s. Mean gradient (S): 16   mm Hg. - Mitral valve: Transvalvular velocity was within the normal range.   There was no evidence for stenosis.  There was mild regurgitation. - Left atrium: The atrium was moderately dilated. - Right ventricle: The cavity size was normal. Wall thickness was   normal. Systolic function was normal. - Atrial septum: No defect or patent foramen ovale was identified. - Tricuspid valve: There was mild regurgitation. Peak RV-RA   gradient (S): 47 mm Hg. - Pulmonary arteries: Systolic pressure was moderately increased.   PA peak pressure: 50 mm Hg (S). - Pericardium, extracardiac: A small pericardial effusion was   identified.  Patient Profile     84 y.o. male with pmhx of  dCHF, CAD s/p CABG (1980s), mild AV stenosis, CKD IV and diabetes admitted directly from cardiology clinic with 24 lb weight gain, anasarca to his umbilicus, and hypoxia to 60s on RA.   Assessment & Plan    Acute on Chronic Diastolic Heart Failure: Patient presented with gross hypervolemia and acute on chronic renal dysfunction. Recent admission for unstable angina in April of 2019. Has a history of CABG x 3 in the 1980s. Underwent R&LHC during recent hospitalization with 3 patent grafts, 2 vessel disease. He was managed medically. Was seen 6/12 for hospital follow up and found to be hypoxic to 60s and grossly volume overloaded. Directly admitted from clinic. He has been receiving IV lasix 80 mg q8h since admission. UOP has totaled 4 L over the past 24 hours, recorded Ins look inaccurate. Weight is down 7lbs since yesterday, 10 lbs since admission.  -- Continue IV lasix 80 TID  -- Strict I/Os, daily weights -- Daily BMP -- Maintain foley catheter given hx prostate cancer, difficulty urinating, and need for aggressive diuresis   -- fluid restrict   Acute on Chronic Renal Failure: CKD IV at baseline, with nephrotic range proteinuria. Admission labs with Scr 3.01 / GFR 20, unchanged today. -- Continue diuretics above -- Appreciate nephrology consult -- Continue amlodipine 10 mg QHS -- Continue Hydralazine 100 mg TID -- No  ACEi/ARB  Hypokalemia: Only minimal since yesterday, 3.2 -> 3.4 -- Continue K-dur to 40 mEq TID -- Monitor closely with renal dysfunction; may need decreased dose in the next day or two  Insomnia:  -- Reorder home trazodone 50 mg QHS   CAD: S/p CABG with recent cath in April 2019, grafts were patent. Normal LVED pressure and mildly elevated pulmonary pressure.  -- Continue ASA 81 and Lipitor 40  Diabetes: A1c 6/13 was 8.6. Takes glimepiride 6 mg qAM and linagliptin 5 mg daily at home. CBGs since admission range 150s - 180s -- Continue Glimepiride & linagliptin  -- SSI - sen TID AC  For questions or updates, please contact [facility] Please consult [email or web address] for contact info under Cardiology/STEMI.   Signed, Velna Ochs, MD  08/10/2017, 11:53 AM

## 2017-08-10 NOTE — Progress Notes (Signed)
Admit: 08/08/2017 LOS: 31  84 y.o. year-old with PMH HTN, HLD, CAD (cath 05/2017 patent bypass grafts; normal EF by ECHO 02/2017) , AS (mild), DM2, OA. Follows with Dr. Rodman Key for CKD3/4. Last seen in April by Dr. Hollie Salk creatinine around 2. Had labs at [facility] 08/01/2017 with creatinine 2.41 GFR 24 (2.27 07/05/17). Admitted to the hospital on the current occasion with several weeks of worsening edema, 20 lb weight gain, exacerbation of dCHF. Creatinine was noted to be 3 on admission, we are asked to see.   Subjective:   4.1L UOP, 4L net negative   Weight down 4.3kg  Lasix 80 IV TID  UP/C 5.0, nephrotic  Stable SCr, K 3.4 on TID 40 KCl  Still with LOTS of edema  06/13 0701 - 06/14 0700 In: 143 [P.O.:120; I.V.:23] Out: 4160 [Urine:4160]  Filed Weights   08/08/17 1714 08/09/17 0453 08/10/17 0501  Weight: 98.4 kg (217 lb) 97.1 kg (214 lb 1.6 oz) 94.1 kg (207 lb 6.4 oz)    Scheduled Meds: . amLODipine  10 mg Oral QHS  . aspirin EC  81 mg Oral Daily  . atorvastatin  40 mg Oral QHS  . folic acid  1 mg Oral Daily  . furosemide  80 mg Intravenous Q8H  . glimepiride  6 mg Oral Q breakfast  . heparin  5,000 Units Subcutaneous Q8H  . hydrALAZINE  100 mg Oral TID  . insulin aspart  0-9 Units Subcutaneous TID WC  . linagliptin  5 mg Oral BH-q7a  . multivitamins with iron  1 tablet Oral Daily  . potassium chloride SA  40 mEq Oral TID  . sodium chloride flush  10-40 mL Intracatheter Q12H  . sodium chloride flush  3 mL Intravenous Q12H   Continuous Infusions: . sodium chloride     PRN Meds:.sodium chloride, acetaminophen, ondansetron (ZOFRAN) IV, sodium chloride flush, sodium chloride flush  Current Labs: reviewed  Results for [name], [name] (MRN [id number]) as of 08/10/2017 09:08  Ref. Range 08/09/2017 14:08  Protein Creatinine Ratio Latest Ref Range: 0.00 - 0.15 mg/mgCre 5.06 (H)    Physical Exam:  Blood pressure (!) 160/70, pulse 74, temperature 98.5 F (36.9 C), temperature  source Oral, resp. rate 20, height 5' 11.5" (1.816 m), weight 94.1 kg (207 lb 6.4 oz), SpO2 93 %. NAD RRR, 2/6 MSM  4+ LEE into thighs Bibasilar crackles, sight inc RR Foley in place  A 1. AoCKD4 with nephrotic proteinuria UP/C 5 further contributing 2. Hypervoelmia / dCHF exacerbation 3. Hx/p prostate cancer, foley catheter in place 4. Hypokalemia, stable  P 1. Cont diuresis, effective in past 24h 2. NOt ACEi/ARB candidate currently with his severe renal dysfunction 3. See what w/u has been done as outpt for his renal disease, make sure has SPEP, sFLC; ? If has membranous related to malignacy.    [name] MD 08/10/2017, 9:10 AM  Recent Labs  Lab 08/08/17 1735 08/09/17 0429 08/10/17 0413  NA 141 144 143  K 3.0* 3.2* 3.4*  CL 99* 101 106  CO2 31 31 31   GLUCOSE 318* 156* 179*  BUN 24* 24* 23*  CREATININE 3.01* 3.11* 3.11*  CALCIUM 8.1* 7.8* 7.7*  PHOS  --   --  2.8   Recent Labs  Lab 08/08/17 1735  WBC 10.9*  NEUTROABS 8.1*  HGB 11.4*  HCT 36.2*  MCV 94.5  PLT 244

## 2017-08-10 NOTE — Progress Notes (Signed)
Received hand off report from Nurse "Zigmund Daniel".  Patient stated understanding of plan to continue diuresing with IV Furosemide. HE denies pain.

## 2017-08-10 NOTE — Progress Notes (Signed)
MD paged twice regarding patient's request for sleep medication and something for constipation. No orders has been placed.   Drue Flirt, RN

## 2017-08-10 NOTE — Care Management Important Message (Signed)
Important Message  Patient Details  Name: [name] MRN: [id number] Date of Birth: [birth year]   Medicare Important Message Given:  Yes    Holiday Mcmenamin P Reshawn Ostlund 08/10/2017, 3:40 PM

## 2017-08-11 LAB — BASIC METABOLIC PANEL
Anion gap: 7 (ref 5–15)
BUN: 19 mg/dL (ref 6–20)
CO2: 28 mmol/L (ref 22–32)
Calcium: 8.2 mg/dL — ABNORMAL LOW (ref 8.9–10.3)
Chloride: 110 mmol/L (ref 101–111)
Creatinine, Ser: 2.82 mg/dL — ABNORMAL HIGH (ref 0.61–1.24)
GFR calc Af Amer: 23 mL/min — ABNORMAL LOW (ref >60)
GFR calc non Af Amer: 20 mL/min — ABNORMAL LOW (ref >60)
Glucose, Bld: 202 mg/dL — ABNORMAL HIGH (ref 65–99)
Potassium: 4.1 mmol/L (ref 3.5–5.1)
Sodium: 145 mmol/L (ref 135–145)

## 2017-08-11 LAB — GLUCOSE, CAPILLARY
Glucose-Capillary: 152 mg/dL — ABNORMAL HIGH (ref 65–99)
Glucose-Capillary: 222 mg/dL — ABNORMAL HIGH (ref 65–99)
Glucose-Capillary: 188 mg/dL — ABNORMAL HIGH (ref 65–99)
Glucose-Capillary: 156 mg/dL — ABNORMAL HIGH (ref 65–99)

## 2017-08-11 LAB — RENAL FUNCTION PANEL
Albumin: 1.6 g/dL — ABNORMAL LOW (ref 3.5–5.0)
Anion gap: 10 (ref 5–15)
BUN: 20 mg/dL (ref 6–20)
CO2: 28 mmol/L (ref 22–32)
Calcium: 8.1 mg/dL — ABNORMAL LOW (ref 8.9–10.3)
Chloride: 106 mmol/L (ref 101–111)
Creatinine, Ser: 2.79 mg/dL — ABNORMAL HIGH (ref 0.61–1.24)
GFR calc Af Amer: 23 mL/min — ABNORMAL LOW (ref >60)
GFR calc non Af Amer: 20 mL/min — ABNORMAL LOW (ref >60)
Glucose, Bld: 162 mg/dL — ABNORMAL HIGH (ref 65–99)
Phosphorus: 3 mg/dL (ref 2.5–4.6)
Potassium: 4 mmol/L (ref 3.5–5.1)
Sodium: 144 mmol/L (ref 135–145)

## 2017-08-11 LAB — MAGNESIUM: Magnesium: 1.8 mg/dL (ref 1.7–2.4)

## 2017-08-11 MED ORDER — ALUM & MAG HYDROXIDE-SIMETH 200-200-20 MG/5ML PO SUSP
15.0000 mL | Freq: Four times a day (QID) | ORAL | Status: DC | PRN
  Administered 2017-08-11: 15 mL via ORAL
  Filled 2017-08-11: qty 30

## 2017-08-11 NOTE — Plan of Care (Signed)
  Problem: Nutrition: Goal: Adequate nutrition will be maintained Outcome: Completed/Met   Problem: Coping: Goal: Level of anxiety will decrease Outcome: Completed/Met   Problem: Pain Managment: Goal: General experience of comfort will improve Outcome: Completed/Met   Problem: Skin Integrity: Goal: Risk for impaired skin integrity will decrease Outcome: Completed/Met   

## 2017-08-11 NOTE — Progress Notes (Signed)
Progress Note  Patient Name: [name] Date of Encounter: 08/11/2017  Primary Cardiologist:   Pixie Casino, MD   Subjective   He is breathing better.  No pain.    Inpatient Medications    Scheduled Meds: . amLODipine  10 mg Oral QHS  . aspirin EC  81 mg Oral Daily  . atorvastatin  40 mg Oral QHS  . folic acid  1 mg Oral Daily  . furosemide  80 mg Intravenous Q8H  . glimepiride  6 mg Oral Q breakfast  . heparin  5,000 Units Subcutaneous Q8H  . hydrALAZINE  100 mg Oral TID  . insulin aspart  0-9 Units Subcutaneous TID WC  . linagliptin  5 mg Oral BH-q7a  . multivitamins with iron  1 tablet Oral Daily  . potassium chloride SA  40 mEq Oral TID  . sodium chloride flush  10-40 mL Intracatheter Q12H  . sodium chloride flush  3 mL Intravenous Q12H  . traZODone  50 mg Oral QHS   Continuous Infusions: . sodium chloride     PRN Meds: sodium chloride, acetaminophen, ondansetron (ZOFRAN) IV, sodium chloride flush, sodium chloride flush   Vital Signs    Vitals:   08/10/17 1235 08/10/17 2001 08/11/17 0530 08/11/17 1230  BP: (!) 149/65 (!) 148/61 133/63 130/67  Pulse: 63 66 66 62  Resp: 20 18 20 20   Temp: 98.3 F (36.8 C) 98.4 F (36.9 C) 98.5 F (36.9 C) 97.7 F (36.5 C)  TempSrc: Oral Oral Oral Oral  SpO2: 100% 90% 93% 91%  Weight:   198 lb 3.2 oz (89.9 kg)   Height:        Intake/Output Summary (Last 24 hours) at 08/11/2017 1256 Last data filed at 08/11/2017 1026 Gross per 24 hour  Intake 1210 ml  Output 5700 ml  Net -4490 ml   Filed Weights   08/09/17 0453 08/10/17 0501 08/11/17 0530  Weight: 214 lb 1.6 oz (97.1 kg) 207 lb 6.4 oz (94.1 kg) 198 lb 3.2 oz (89.9 kg)    Telemetry    NSR - Personally Reviewed  ECG    NA - Personally Reviewed  Physical Exam   GEN: No acute distress.   Neck:   Positive JVD to jaw at 45 degrees Cardiac: RRR, 3/6 apical systolic murmur, no diastolic murmurs, rubs, or gallops.  Respiratory: Clear  to auscultation  bilaterally. GI: Soft, nontender, non-distended  MS:   Severe edema; No deformity. Neuro:  Nonfocal  Psych: Normal affect   Labs    Chemistry Recent Labs  Lab 08/09/17 0429 08/10/17 0413 08/11/17 0432  NA 144 143 144  K 3.2* 3.4* 4.0  CL 101 106 106  CO2 31 31 28   GLUCOSE 156* 179* 162*  BUN 24* 23* 20  CREATININE 3.11* 3.11* 2.79*  CALCIUM 7.8* 7.7* 8.1*  ALBUMIN  --  1.7* 1.6*  GFRNONAA 17* 17* 20*  GFRAA 20* 20* 23*  ANIONGAP 12 6 10      Hematology Recent Labs  Lab 08/08/17 1735  WBC 10.9*  RBC 3.83*  HGB 11.4*  HCT 36.2*  MCV 94.5  MCH 29.8  MCHC 31.5  RDW 14.6  PLT 244    Cardiac EnzymesNo results for input(s): TROPONINI in the last 168 hours. No results for input(s): TROPIPOC in the last 168 hours.   BNP Recent Labs  Lab 08/08/17 1735  BNP 653.2*     DDimer No results for input(s): DDIMER in the last 168 hours.  Radiology    No results found.  Cardiac Studies   NA  Patient Profile     84 y.o. male with pmhx of dCHF, CAD s/p CABG (1980s), mild AV stenosis, CKD IV and diabetes admitted directly from cardiology clinic with 24 lb weight gain, anasarca to his umbilicus, and hypoxia to 60s on RA.   Assessment & Plan    ACUTE ON CHRONIC DIASTOLIC HF:   Down 10 liters since admission. Weight down significantly.    Continue current IV diuresis  CKD IV:   Creat is stable.  Nephrology is following.     CAD:  Medical management.    DM:    Continue current meds and SSI.      For questions or updates, please contact [place] Please consult [email or web address] for contact info under Cardiology/STEMI.   Signed, Minus Breeding, MD  08/11/2017, 12:56 PM

## 2017-08-11 NOTE — Progress Notes (Signed)
Admit: 08/08/2017 LOS: 65  84 y.o. year-old with PMH HTN, HLD, CAD (cath 05/2017 patent bypass grafts; normal EF by ECHO 02/2017) , AS (mild), DM2, OA. Follows with Dr. Rodman Key for CKD3/4. Last seen in April by Dr. Hollie Salk creatinine around 2. Had labs at [facility] 08/01/2017 with creatinine 2.41 GFR 24 (2.27 07/05/17). Admitted to the hospital on the current occasion with several weeks of worsening edema, 20 lb weight gain, exacerbation of dCHF. Creatinine was noted to be 3 on admission, we are asked to see.   Subjective:   6.1 L urine output yesterday, nearly 5 L net negative, more than 10 L negative since admission  Upper body swelling improved but lower extremity edema looks unchanged  Weight down 9kg  UP/C 5.0, nephrotic  Serum creatinine improved to 2.79 with potassium 4.0  06/14 0701 - 06/15 0700 In: 1210 [P.O.:1200; I.V.:10] Out: 6100 [Urine:6100]  Filed Weights   08/09/17 0453 08/10/17 0501 08/11/17 0530  Weight: 97.1 kg (214 lb 1.6 oz) 94.1 kg (207 lb 6.4 oz) 89.9 kg (198 lb 3.2 oz)    Scheduled Meds: . amLODipine  10 mg Oral QHS  . aspirin EC  81 mg Oral Daily  . atorvastatin  40 mg Oral QHS  . folic acid  1 mg Oral Daily  . furosemide  80 mg Intravenous Q8H  . glimepiride  6 mg Oral Q breakfast  . heparin  5,000 Units Subcutaneous Q8H  . hydrALAZINE  100 mg Oral TID  . insulin aspart  0-9 Units Subcutaneous TID WC  . linagliptin  5 mg Oral BH-q7a  . multivitamins with iron  1 tablet Oral Daily  . potassium chloride SA  40 mEq Oral TID  . sodium chloride flush  10-40 mL Intracatheter Q12H  . sodium chloride flush  3 mL Intravenous Q12H  . traZODone  50 mg Oral QHS   Continuous Infusions: . sodium chloride     PRN Meds:.sodium chloride, acetaminophen, ondansetron (ZOFRAN) IV, sodium chloride flush, sodium chloride flush  Current Labs: reviewed  Results for [name], [name] (MRN [id number]) as of 08/10/2017 09:08  Ref. Range 08/09/2017 14:08  Protein Creatinine  Ratio Latest Ref Range: 0.00 - 0.15 mg/mgCre 5.06 (H)    Physical Exam:  Blood pressure 133/63, pulse 66, temperature 98.5 F (36.9 C), temperature source Oral, resp. rate 20, height 5' 11.5" (1.816 m), weight 89.9 kg (198 lb 3.2 oz), SpO2 93 %. NAD RRR, 2/6 MSM  4+ LEE into thighs Bibasilar crackles, sight inc RR Foley in place  A 1. AoCKD4 with nephrotic proteinuria UP/C 5 further contributing 2. Hypervoelmia / dCHF exacerbation 3. Hx/p prostate cancer, foley catheter in place 4. Hypokalemia, stable  P 1. Cont diuresis, effective in past 24h 2. BID labs, check Mag 3. NOt ACEi/ARB candidate currently with his severe renal dysfunction 4. Check SPEP and sFLC 5. Daily weights, Daily Renal Panel, Strict I/Os, Avoid nephrotoxins (NSAIDs, judicious IV Contrast)   Nathaniel Grippe MD 08/11/2017, 10:43 AM  Recent Labs  Lab 08/09/17 0429 08/10/17 0413 08/11/17 0432  NA 144 143 144  K 3.2* 3.4* 4.0  CL 101 106 106  CO2 31 31 28   GLUCOSE 156* 179* 162*  BUN 24* 23* 20  CREATININE 3.11* 3.11* 2.79*  CALCIUM 7.8* 7.7* 8.1*  PHOS  --  2.8 3.0   Recent Labs  Lab 08/08/17 1735  WBC 10.9*  NEUTROABS 8.1*  HGB 11.4*  HCT 36.2*  MCV 94.5  PLT 244

## 2017-08-12 LAB — GLUCOSE, CAPILLARY
Glucose-Capillary: 189 mg/dL — ABNORMAL HIGH (ref 65–99)
Glucose-Capillary: 151 mg/dL — ABNORMAL HIGH (ref 65–99)
Glucose-Capillary: 156 mg/dL — ABNORMAL HIGH (ref 65–99)
Glucose-Capillary: 149 mg/dL — ABNORMAL HIGH (ref 65–99)

## 2017-08-12 LAB — BASIC METABOLIC PANEL
Anion gap: 8 (ref 5–15)
BUN: 22 mg/dL — ABNORMAL HIGH (ref 6–20)
CO2: 28 mmol/L (ref 22–32)
Calcium: 8.4 mg/dL — ABNORMAL LOW (ref 8.9–10.3)
Chloride: 108 mmol/L (ref 101–111)
Creatinine, Ser: 2.8 mg/dL — ABNORMAL HIGH (ref 0.61–1.24)
GFR calc Af Amer: 23 mL/min — ABNORMAL LOW (ref >60)
GFR calc non Af Amer: 20 mL/min — ABNORMAL LOW (ref >60)
Glucose, Bld: 161 mg/dL — ABNORMAL HIGH (ref 65–99)
Potassium: 4.6 mmol/L (ref 3.5–5.1)
Sodium: 144 mmol/L (ref 135–145)

## 2017-08-12 LAB — RENAL FUNCTION PANEL
Albumin: 1.8 g/dL — ABNORMAL LOW (ref 3.5–5.0)
Anion gap: 10 (ref 5–15)
BUN: 18 mg/dL (ref 6–20)
CO2: 26 mmol/L (ref 22–32)
Calcium: 8.3 mg/dL — ABNORMAL LOW (ref 8.9–10.3)
Chloride: 107 mmol/L (ref 101–111)
Creatinine, Ser: 2.69 mg/dL — ABNORMAL HIGH (ref 0.61–1.24)
GFR calc Af Amer: 24 mL/min — ABNORMAL LOW (ref >60)
GFR calc non Af Amer: 21 mL/min — ABNORMAL LOW (ref >60)
Glucose, Bld: 177 mg/dL — ABNORMAL HIGH (ref 65–99)
Phosphorus: 3.2 mg/dL (ref 2.5–4.6)
Potassium: 4.3 mmol/L (ref 3.5–5.1)
Sodium: 143 mmol/L (ref 135–145)

## 2017-08-12 LAB — MAGNESIUM: Magnesium: 1.8 mg/dL (ref 1.7–2.4)

## 2017-08-12 NOTE — Evaluation (Signed)
Physical Therapy Evaluation Patient Details Name: [name] MRN: [id number] DOB: 01-26-1934 Today's Date: 08/12/2017   History of Present Illness  83 y.o. male with pmhx of dCHF, CAD s/p CABG (1980s), mild AV stenosis, CKD IV and diabetes admitted directly from cardiology clinic with 24 lb weight gain, anasarca to his umbilicus, and hypoxia to 60s on RA.     Clinical Impression  Pt admitted with above diagnosis. Pt currently with functional limitations due to the deficits listed below (see PT Problem List). PTA, pt living with wife in 1 level home with stairs to enter, modified independent with all mobility utilizing a SPC for last 4 months, does not wear home O2. Upon eval pt presents with mild imbalance and deconditioning. Desats on RA with rest, with activity remains 88-90% on 2L. Pt ambulating 140' in hallway with [facility], however would be safer with rollator at this time. Discussed energy conservation and exercise recommendations, rec HHPT to progress safe mobility after d/c. Next PT visit to focus on stairs.   Pt will benefit from skilled PT to increase their independence and safety with mobility to allow discharge to the venue listed below.       Follow Up Recommendations Home health PT;Supervision for mobility/OOB    Equipment Recommendations  (Rollator )    Recommendations for Other Services       Precautions / Restrictions Precautions Precautions: Fall Precaution Comments: watch O2 sats Restrictions Weight Bearing Restrictions: No      Mobility  Bed Mobility Overal bed mobility: Modified Independent                Transfers Overall transfer level: Needs assistance Equipment used: None Transfers: Sit to/from Stand Sit to Stand: Supervision            Ambulation/Gait Ambulation/Gait assistance: Min guard Gait Distance (Feet): 140 Feet Assistive device: Straight cane Gait Pattern/deviations: Step-to pattern;Step-through pattern Gait velocity:  decreased   General Gait Details: patient with mild unsteadiness ambulating on 2L O2, de sats on RA or 1L SpO2 reamined 88-90% this visit on 2L. x2 instances of imbalance, would be safer in short term with rollator   Stairs Stairs: (pt c/o of lighthead first time up in days, defered 2 next )          Wheelchair Mobility    Modified Rankin (Stroke Patients Only)       Balance Overall balance assessment: Needs assistance   Sitting balance-Leahy Scale: Good       Standing balance-Leahy Scale: Fair Standing balance comment: can stand static without UE support                             Pertinent Vitals/Pain Pain Assessment: No/denies pain    Home Living Family/patient expects to be discharged to:: Private residence Living Arrangements: Spouse/significant other Available Help at Discharge: Family;Available 24 hours/day Type of Home: House Home Access: Stairs to enter Entrance Stairs-Rails: Right   Home Layout: One level Home Equipment: Cane - single point      Prior Function Level of Independence: Independent with assistive device(s)         Comments: limited communtity ambulation with SPC     Hand Dominance        Extremity/Trunk Assessment   Upper Extremity Assessment Upper Extremity Assessment: Overall WFL for tasks assessed    Lower Extremity Assessment Lower Extremity Assessment: Overall WFL for tasks assessed       Communication  Cognition Arousal/Alertness: Awake/alert Behavior During Therapy: WFL for tasks assessed/performed Overall Cognitive Status: Within Functional Limits for tasks assessed                                        General Comments General comments (skin integrity, edema, etc.): Discussed exercise recomendations with pt and family    Exercises     Assessment/Plan    PT Assessment Patient needs continued PT services  PT Problem List Cardiopulmonary status limiting  activity;Decreased strength;Decreased range of motion;Decreased activity tolerance;Decreased balance       PT Treatment Interventions DME instruction;Gait training;Stair training;Functional mobility training;Therapeutic activities;Therapeutic exercise;Balance training    PT Goals (Current goals can be found in the Care Plan section)  Acute Rehab PT Goals Patient Stated Goal: go home when ready PT Goal Formulation: With patient/family Time For Goal Achievement: 08/19/17 Potential to Achieve Goals: Good    Frequency Min 3X/week   Barriers to discharge        Co-evaluation               AM-PAC PT "6 Clicks" Daily Activity  Outcome Measure Difficulty turning over in bed (including adjusting bedclothes, sheets and blankets)?: None Difficulty moving from lying on back to sitting on the side of the bed? : None Difficulty sitting down on and standing up from a chair with arms (e.g., wheelchair, bedside commode, etc,.)?: None Help needed moving to and from a bed to chair (including a wheelchair)?: A Little Help needed walking in hospital room?: A Little Help needed climbing 3-5 steps with a railing? : A Little 6 Click Score: 21    End of Session Equipment Utilized During Treatment: Gait belt;Oxygen(2L) Activity Tolerance: Patient tolerated treatment well Patient left: in bed;with call bell/phone within reach;with nursing/sitter in room;with family/visitor present Nurse Communication: Mobility status PT Visit Diagnosis: Unsteadiness on feet (R26.81);Difficulty in walking, not elsewhere classified (R26.2)    Time: 0920-0950 PT Time Calculation (min) (ACUTE ONLY): 30 min   Charges:   PT Evaluation $PT Eval Low Complexity: 1 Low PT Treatments $Gait Training: 8-22 mins   PT G Codes:        Reinaldo Berber, PT, DPT Acute Rehab Services Pager: [phone]    Reinaldo Berber 08/12/2017, 10:19 AM

## 2017-08-12 NOTE — Progress Notes (Signed)
Progress Note  Patient Name: [name] Date of Encounter: 08/12/2017  Primary Cardiologist:   Pixie Casino, MD   Subjective   Ambulated with PT.  Breathing better.    Inpatient Medications    Scheduled Meds: . amLODipine  10 mg Oral QHS  . aspirin EC  81 mg Oral Daily  . atorvastatin  40 mg Oral QHS  . folic acid  1 mg Oral Daily  . furosemide  80 mg Intravenous Q8H  . glimepiride  6 mg Oral Q breakfast  . heparin  5,000 Units Subcutaneous Q8H  . hydrALAZINE  100 mg Oral TID  . insulin aspart  0-9 Units Subcutaneous TID WC  . linagliptin  5 mg Oral BH-q7a  . multivitamins with iron  1 tablet Oral Daily  . potassium chloride SA  40 mEq Oral TID  . sodium chloride flush  10-40 mL Intracatheter Q12H  . sodium chloride flush  3 mL Intravenous Q12H  . traZODone  50 mg Oral QHS   Continuous Infusions: . sodium chloride     PRN Meds: sodium chloride, acetaminophen, alum & mag hydroxide-simeth, ondansetron (ZOFRAN) IV, sodium chloride flush, sodium chloride flush   Vital Signs    Vitals:   08/11/17 1230 08/11/17 2059 08/12/17 0146 08/12/17 0554  BP: 130/67 (!) 144/56 (!) 126/58 134/68  Pulse: 62 61 64 61  Resp: 20 18  18   Temp: 97.7 F (36.5 C) 98.1 F (36.7 C)  98 F (36.7 C)  TempSrc: Oral Oral  Oral  SpO2: 91% 93%  97%  Weight:    189 lb 14.4 oz (86.1 kg)  Height:        Intake/Output Summary (Last 24 hours) at 08/12/2017 1132 Last data filed at 08/12/2017 0700 Gross per 24 hour  Intake 580 ml  Output 3975 ml  Net -3395 ml   Filed Weights   08/10/17 0501 08/11/17 0530 08/12/17 0554  Weight: 207 lb 6.4 oz (94.1 kg) 198 lb 3.2 oz (89.9 kg) 189 lb 14.4 oz (86.1 kg)    Telemetry    NSR - Personally Reviewed  ECG    NA - Personally Reviewed  Physical Exam   GEN: No  acute distress.   Neck: No  JVD Cardiac: RRR, no murmurs, rubs, or gallops.  Respiratory: Clear   to auscultation bilaterally. GI: Soft, nontender, non-distended, normal  bowel sounds  MS:  Moderate leg edema; No deformity. Neuro:   Nonfocal  Psych: Oriented and appropriate    Labs    Chemistry Recent Labs  Lab 08/10/17 0413 08/11/17 0432 08/11/17 1512 08/12/17 0710  NA 143 144 145 143  K 3.4* 4.0 4.1 4.3  CL 106 106 110 107  CO2 31 28 28 26   GLUCOSE 179* 162* 202* 177*  BUN 23* 20 19 18   CREATININE 3.11* 2.79* 2.82* 2.69*  CALCIUM 7.7* 8.1* 8.2* 8.3*  ALBUMIN 1.7* 1.6*  --  1.8*  GFRNONAA 17* 20* 20* 21*  GFRAA 20* 23* 23* 24*  ANIONGAP 6 10 7 10      Hematology Recent Labs  Lab 08/08/17 1735  WBC 10.9*  RBC 3.83*  HGB 11.4*  HCT 36.2*  MCV 94.5  MCH 29.8  MCHC 31.5  RDW 14.6  PLT 244    Cardiac EnzymesNo results for input(s): TROPONINI in the last 168 hours. No results for input(s): TROPIPOC in the last 168 hours.   BNP Recent Labs  Lab 08/08/17 1735  BNP 653.2*     DDimer  No results for input(s): DDIMER in the last 168 hours.   Radiology    No results found.  Cardiac Studies   NA  Patient Profile     84 y.o. male with pmhx of dCHF, CAD s/p CABG (1980s), mild AV stenosis, CKD IV and diabetes admitted directly from cardiology clinic with 24 lb weight gain, anasarca to his umbilicus, and hypoxia to 60s on RA.   Assessment & Plan    ACUTE ON CHRONIC DIASTOLIC HF:   Down 14 liters since admission.  Still with excessive leg edema.  I would continue IV diuresis today.   CKD IV:   Creat is slowly improving.   CAD:  Medical management.    DM:    Continue current meds and SSI.      For questions or updates, please contact [place] Please consult [email or web address] for contact info under Cardiology/STEMI.   Signed, Minus Breeding, MD  08/12/2017, 11:32 AM

## 2017-08-12 NOTE — Progress Notes (Signed)
Admit: 08/08/2017 LOS: 70  84 y.o. year-old with PMH HTN, HLD, CAD (cath 05/2017 patent bypass grafts; normal EF by ECHO 02/2017) , AS (mild), DM2, OA. Follows with Dr. Rodman Key for CKD3/4. Last seen in April by Dr. Hollie Salk creatinine around 2. Had labs at [facility] 08/01/2017 with creatinine 2.41 GFR 24 (2.27 07/05/17). Admitted to the hospital on the current occasion with several weeks of worsening edema, 20 lb weight gain, exacerbation of dCHF. Creatinine was noted to be 3 on admission, we are asked to see.   Subjective:   Excellent urine output again yesterday, overall net -14 L, weight down 12 kg  Still has quite a bit of swelling in the legs  Stable serum creatinine, potassium, magnesium  06/15 0701 - 06/16 0700 In: 820 [P.O.:820] Out: 4575 [Urine:4575]  Filed Weights   08/10/17 0501 08/11/17 0530 08/12/17 0554  Weight: 94.1 kg (207 lb 6.4 oz) 89.9 kg (198 lb 3.2 oz) 86.1 kg (189 lb 14.4 oz)    Scheduled Meds: . amLODipine  10 mg Oral QHS  . aspirin EC  81 mg Oral Daily  . atorvastatin  40 mg Oral QHS  . folic acid  1 mg Oral Daily  . furosemide  80 mg Intravenous Q8H  . glimepiride  6 mg Oral Q breakfast  . heparin  5,000 Units Subcutaneous Q8H  . hydrALAZINE  100 mg Oral TID  . insulin aspart  0-9 Units Subcutaneous TID WC  . linagliptin  5 mg Oral BH-q7a  . multivitamins with iron  1 tablet Oral Daily  . potassium chloride SA  40 mEq Oral TID  . sodium chloride flush  10-40 mL Intracatheter Q12H  . sodium chloride flush  3 mL Intravenous Q12H  . traZODone  50 mg Oral QHS   Continuous Infusions: . sodium chloride     PRN Meds:.sodium chloride, acetaminophen, alum & mag hydroxide-simeth, ondansetron (ZOFRAN) IV, sodium chloride flush, sodium chloride flush  Current Labs: reviewed  Results for [name], [name] (MRN [id number]) as of 08/10/2017 09:08  Ref. Range 08/09/2017 14:08  Protein Creatinine Ratio Latest Ref Range: 0.00 - 0.15 mg/mgCre 5.06 (H)   SPEP and sFLC  pending  Physical Exam:  Blood pressure 134/68, pulse 61, temperature 98 F (36.7 C), temperature source Oral, resp. rate 18, height 5' 11.5" (1.816 m), weight 86.1 kg (189 lb 14.4 oz), SpO2 97 %. NAD RRR, 2/6 MSM  4+ LEE into thighs Bibasilar crackles, sight inc RR Foley in place  A 1. AoCKD4 with nephrotic proteinuria UP/C 5 further contributing 2. Hypervoelmia / dCHF exacerbation 3. Hx/p prostate cancer, foley catheter in place 4. Hypokalemia, stable   P 1. Cont diuresis, 2. BID labs, Daily Mg 3. Not currently ACEi/ARB candidate currently with his severe renal dysfunction 4. SPEP and sFLC pending 5. Daily weights, Daily Renal Panel, Strict I/Os, Avoid nephrotoxins (NSAIDs, judicious IV Contrast)   [name] MD 08/12/2017, 9:12 AM  Recent Labs  Lab 08/10/17 0413 08/11/17 0432 08/11/17 1512 08/12/17 0710  NA 143 144 145 143  K 3.4* 4.0 4.1 4.3  CL 106 106 110 107  CO2 31 28 28 26   GLUCOSE 179* 162* 202* 177*  BUN 23* 20 19 18   CREATININE 3.11* 2.79* 2.82* 2.69*  CALCIUM 7.7* 8.1* 8.2* 8.3*  PHOS 2.8 3.0  --  3.2   Recent Labs  Lab 08/08/17 1735  WBC 10.9*  NEUTROABS 8.1*  HGB 11.4*  HCT 36.2*  MCV 94.5  PLT 244

## 2017-08-12 NOTE — Progress Notes (Signed)
MD, pt was asking to increase his dose of Trazadone to 100 mg instead of 50 mg, still having trouble sleeping, thanks [name].

## 2017-08-13 ENCOUNTER — Other Ambulatory Visit: Payer: Self-pay

## 2017-08-13 LAB — RENAL FUNCTION PANEL
Albumin: 1.8 g/dL — ABNORMAL LOW (ref 3.5–5.0)
Anion gap: 8 (ref 5–15)
BUN: 20 mg/dL (ref 6–20)
CO2: 25 mmol/L (ref 22–32)
Calcium: 8.4 mg/dL — ABNORMAL LOW (ref 8.9–10.3)
Chloride: 110 mmol/L (ref 101–111)
Creatinine, Ser: 2.83 mg/dL — ABNORMAL HIGH (ref 0.61–1.24)
GFR calc Af Amer: 23 mL/min — ABNORMAL LOW (ref >60)
GFR calc non Af Amer: 19 mL/min — ABNORMAL LOW (ref >60)
Glucose, Bld: 143 mg/dL — ABNORMAL HIGH (ref 65–99)
Phosphorus: 3.4 mg/dL (ref 2.5–4.6)
Potassium: 4.7 mmol/L (ref 3.5–5.1)
Sodium: 143 mmol/L (ref 135–145)

## 2017-08-13 LAB — PROTEIN ELECTROPHORESIS, SERUM
A/G Ratio: 0.6 — ABNORMAL LOW (ref 0.7–1.7)
Albumin ELP: 1.7 g/dL — ABNORMAL LOW (ref 2.9–4.4)
Alpha-1-Globulin: 0.4 g/dL (ref 0.0–0.4)
Alpha-2-Globulin: 0.9 g/dL (ref 0.4–1.0)
Beta Globulin: 0.9 g/dL (ref 0.7–1.3)
Gamma Globulin: 0.6 g/dL (ref 0.4–1.8)
Globulin, Total: 2.7 g/dL (ref 2.2–3.9)
Total Protein ELP: 4.4 g/dL — ABNORMAL LOW (ref 6.0–8.5)

## 2017-08-13 LAB — GLUCOSE, CAPILLARY
Glucose-Capillary: 155 mg/dL — ABNORMAL HIGH (ref 65–99)
Glucose-Capillary: 249 mg/dL — ABNORMAL HIGH (ref 65–99)
Glucose-Capillary: 181 mg/dL — ABNORMAL HIGH (ref 65–99)
Glucose-Capillary: 160 mg/dL — ABNORMAL HIGH (ref 65–99)

## 2017-08-13 LAB — KAPPA/LAMBDA LIGHT CHAINS
Kappa free light chain: 108.1 mg/L — ABNORMAL HIGH (ref 3.3–19.4)
Kappa, lambda light chain ratio: 1.23 (ref 0.26–1.65)
Lambda free light chains: 87.6 mg/L — ABNORMAL HIGH (ref 5.7–26.3)

## 2017-08-13 MED ORDER — FUROSEMIDE 80 MG PO TABS
80.0000 mg | ORAL_TABLET | Freq: Every day | ORAL | Status: DC
  Administered 2017-08-14 – 2017-08-15 (×2): 80 mg via ORAL
  Filled 2017-08-13 (×2): qty 1

## 2017-08-13 NOTE — Progress Notes (Addendum)
Physical Therapy Treatment Patient Details Name: [name] MRN: [id number] DOB: [birth year] Today's Date: 08/13/2017    History of Present Illness 84 y.o. male with pmhx of dCHF, CAD s/p CABG (1980s), mild AV stenosis, CKD IV and diabetes admitted directly from cardiology clinic with 24 lb weight gain, anasarca to his umbilicus, and hypoxia to 60s on RA.     PT Comments    Today's skilled session focused on gait training with rollator. Pt educated on correct technique with rollator and brakes. He ambulated 350 ft initially on RA, pt with steady desaturation, requiring SpO2 be increased to .5L. Pt's SpO2 remained at 92% during mobility on .5L. Plan for stair training next session. Will continue to follow acutely to maximized pt's functional independence and activity tolerance.     Follow Up Recommendations  Home health PT;Supervision for mobility/OOB     Equipment Recommendations  (Rollator )    Recommendations for Other Services       Precautions / Restrictions Precautions Precautions: Fall Precaution Comments: watch O2 sats Restrictions Weight Bearing Restrictions: No    Mobility  Bed Mobility Overal bed mobility: Modified Independent                Transfers Overall transfer level: Needs assistance Equipment used: 4-wheeled walker Transfers: Sit to/from Stand Sit to Stand: Supervision         General transfer comment: Cues for brakes with rollator  Ambulation/Gait Ambulation/Gait assistance: Min guard Gait Distance (Feet): 350 Feet Assistive device: 4-wheeled walker Gait Pattern/deviations: Step-through pattern Gait velocity: decreased   General Gait Details: Pt with improved stability using rollator this session. Began ambulation on RA. pt's SpO2 steadily dropped to 90%, and was placed back on .5L O2. SpO2 increased to 92% for remainder of session.   Stairs             Wheelchair Mobility    Modified Rankin (Stroke Patients Only)        Balance Overall balance assessment: Needs assistance   Sitting balance-Leahy Scale: Good       Standing balance-Leahy Scale: Fair Standing balance comment: can stand static without UE support                            Cognition Arousal/Alertness: Awake/alert Behavior During Therapy: WFL for tasks assessed/performed Overall Cognitive Status: Within Functional Limits for tasks assessed                                        Exercises      General Comments General comments (skin integrity, edema, etc.): On arrival to room, pt incontinent of stool in bed, requrining assist for peri care and gown change.      Pertinent Vitals/Pain Pain Assessment: No/denies pain    Home Living                      Prior Function            PT Goals (current goals can now be found in the care plan section) Acute Rehab PT Goals Patient Stated Goal: go home when ready PT Goal Formulation: With patient/family Time For Goal Achievement: 08/19/17 Potential to Achieve Goals: Good Progress towards PT goals: Progressing toward goals    Frequency    Min 3X/week      PT Plan Current plan remains  appropriate    Co-evaluation              AM-PAC PT "6 Clicks" Daily Activity  Outcome Measure  Difficulty turning over in bed (including adjusting bedclothes, sheets and blankets)?: None Difficulty moving from lying on back to sitting on the side of the bed? : None Difficulty sitting down on and standing up from a chair with arms (e.g., wheelchair, bedside commode, etc,.)?: None Help needed moving to and from a bed to chair (including a wheelchair)?: A Little Help needed walking in hospital room?: A Little Help needed climbing 3-5 steps with a railing? : A Little 6 Click Score: 21    End of Session Equipment Utilized During Treatment: Gait belt;Oxygen(2L) Activity Tolerance: Patient tolerated treatment well Patient left: with call  bell/phone within reach;with family/visitor present;in chair Nurse Communication: Mobility status PT Visit Diagnosis: Unsteadiness on feet (R26.81);Difficulty in walking, not elsewhere classified (R26.2)     Time: 5625-6389 PT Time Calculation (min) (ACUTE ONLY): 24 min  Charges:  $Gait Training: 8-22 mins $Therapeutic Activity: 8-22 mins                    G Codes:       Benjiman Core, [place] Pager [phone] Acute Rehab   Allena Katz 08/13/2017, 2:02 PM

## 2017-08-13 NOTE — Plan of Care (Signed)
  Problem: Activity: Goal: Risk for activity intolerance will decrease Outcome: Progressing   

## 2017-08-13 NOTE — Consult Note (Signed)
   [facility] CM Inpatient Consult   08/13/2017  KARSEN NAKANISHI 05-Mar-1933 [id number]  Patient was assessed  for  [place] [facility] outreach for services. [facility] staff member attempted medication follow up with out success for med adherence. Patient is in the [place]. Met with the patient and wife at the bedside.  Patient was resting quietly in bed. Patient denies any issues at this time with his medications.  They use CSW in [place].  Primary care provider is Dr. Jani Gravel.  This provider is listed to provide the transition of care calls.   Patient states to the best of his knowledge he has enough of his medications.  Will have [place] to follow up.  They prefer phone calls at this time but not opposed to home visit.  Spoke with [place] from [facility].  Will follow up.     For questions contact:   Natividad Brood, RN BSN [facility] Liaison  [phone] business mobile phone Toll free office [phone]

## 2017-08-13 NOTE — Patient Outreach (Signed)
[facility]) Care Management  08/13/2017  IVERY NANNEY [birth year] [id number]   Medication Adherence call to Mr : Elis Sauber left a message for patient to call back patient is due on Irbesartan 300 mg Pharmacy said patient has not pick up since May/2019  Mr. Grumbine is showing past due under [place].   [place] Management Direct Dial [phone]  Fax [phone] [email or web address]

## 2017-08-13 NOTE — Progress Notes (Signed)
Admit: 08/08/2017 LOS: 72  84 y.o. year-old with PMH HTN, HLD, CAD (cath 05/2017 patent bypass grafts; normal EF by ECHO 02/2017) , AS (mild), DM2, OA. Follows with Dr. Rodman Key for CKD3/4. Last seen in April by me creatinine around 2. Had labs at [facility] 08/01/2017 with creatinine 2.41 GFR 24 (2.27 07/05/17). Admitted to the hospital on the current occasion with several weeks of worsening edema, 20 lb weight gain, exacerbation of dCHF. Creatinine was noted to be 3 on admission, we are asked to see.   Subjective:   Robust diuresis- still some ways to go  Feeling better.  Not quite off O2 yet.   06/16 0701 - 06/17 0700 In: 100 [P.O.:100] Out: 6250 [Urine:6250]  Filed Weights   08/11/17 0530 08/12/17 0554 08/13/17 0516  Weight: 89.9 kg (198 lb 3.2 oz) 86.1 kg (189 lb 14.4 oz) 82.8 kg (182 lb 9.6 oz)    Scheduled Meds: . amLODipine  10 mg Oral QHS  . aspirin EC  81 mg Oral Daily  . atorvastatin  40 mg Oral QHS  . folic acid  1 mg Oral Daily  . furosemide  80 mg Intravenous Q8H  . [START ON 08/14/2017] furosemide  80 mg Oral Daily  . glimepiride  6 mg Oral Q breakfast  . heparin  5,000 Units Subcutaneous Q8H  . hydrALAZINE  100 mg Oral TID  . insulin aspart  0-9 Units Subcutaneous TID WC  . linagliptin  5 mg Oral BH-q7a  . multivitamins with iron  1 tablet Oral Daily  . potassium chloride SA  40 mEq Oral TID  . sodium chloride flush  10-40 mL Intracatheter Q12H  . sodium chloride flush  3 mL Intravenous Q12H  . traZODone  50 mg Oral QHS   Continuous Infusions: . sodium chloride     PRN Meds:.sodium chloride, acetaminophen, alum & mag hydroxide-simeth, ondansetron (ZOFRAN) IV, sodium chloride flush, sodium chloride flush  Current Labs: reviewed  Results for [name], HOLTZER (MRN [id number]) as of 08/10/2017 09:08  Ref. Range 08/09/2017 14:08  Protein Creatinine Ratio Latest Ref Range: 0.00 - 0.15 mg/mgCre 5.06 (H)   SPEP and sFLC pending  Physical Exam:  Blood pressure 135/67,  pulse 60, temperature 97.8 F (36.6 C), temperature source Oral, resp. rate 18, height 5' 11.5" (1.816 m), weight 82.8 kg (182 lb 9.6 oz), SpO2 96 %. GEN: younger than stated age, NAD HEENT: EOMI PERRL CV: RRR, 2/6 crescendo/ decrescendo murmur PULM: bibasilar crackles, R > L EXT: 3+ LE edema  A/P 1. AoCKD4 with nephrotic proteinuria UP/C 5 further contributing.  Continue Lasix 80 IV TID, agree with transition to oral tomorrow as ordered.  SPEP and free light chains pending.  Was on irbesartan as OP- don't rec restarting now 2. Hypervoelmia / dCHF exacerbation 3. Hx/p prostate cancer, foley catheter in place 4. Hypokalemia, stable    Madelon Lips MD [place] pgr [phone] 08/13/2017, 10:29 AM  Recent Labs  Lab 08/11/17 0432  08/12/17 0710 08/12/17 1517 08/13/17 0502  NA 144   < > 143 144 143  K 4.0   < > 4.3 4.6 4.7  CL 106   < > 107 108 110  CO2 28   < > 26 28 25   GLUCOSE 162*   < > 177* 161* 143*  BUN 20   < > 18 22* 20  CREATININE 2.79*   < > 2.69* 2.80* 2.83*  CALCIUM 8.1*   < > 8.3* 8.4* 8.4*  PHOS 3.0  --  3.2  --  3.4   < > = values in this interval not displayed.   Recent Labs  Lab 08/08/17 1735  WBC 10.9*  NEUTROABS 8.1*  HGB 11.4*  HCT 36.2*  MCV 94.5  PLT 244

## 2017-08-13 NOTE — Progress Notes (Signed)
Progress Note  Patient Name: [name] Date of Encounter: 08/13/2017  Primary Cardiologist: Pixie Casino, MD   Subjective   He feels much better.  Was able to walk to the nurses station and back, only became mildly dyspneic.  Edema is almost resolved.  Continues prodigious diuresis.  Creatinine unchanged at 2.8. Net weight loss 35 pounds, net in/out diuresis 21 L. He estimated that his "dry weight" was 190 pounds, but he is already well beneath this level and still has some mild swelling.  He saw Dr. Debara Pickett in clinic in April he weighed 193 pounds.  Inpatient Medications    Scheduled Meds: . amLODipine  10 mg Oral QHS  . aspirin EC  81 mg Oral Daily  . atorvastatin  40 mg Oral QHS  . folic acid  1 mg Oral Daily  . furosemide  80 mg Intravenous Q8H  . glimepiride  6 mg Oral Q breakfast  . heparin  5,000 Units Subcutaneous Q8H  . hydrALAZINE  100 mg Oral TID  . insulin aspart  0-9 Units Subcutaneous TID WC  . linagliptin  5 mg Oral BH-q7a  . multivitamins with iron  1 tablet Oral Daily  . potassium chloride SA  40 mEq Oral TID  . sodium chloride flush  10-40 mL Intracatheter Q12H  . sodium chloride flush  3 mL Intravenous Q12H  . traZODone  50 mg Oral QHS   Continuous Infusions: . sodium chloride     PRN Meds: sodium chloride, acetaminophen, alum & mag hydroxide-simeth, ondansetron (ZOFRAN) IV, sodium chloride flush, sodium chloride flush   Vital Signs    Vitals:   08/12/17 1944 08/12/17 1944 08/13/17 0157 08/13/17 0516  BP:  (!) 139/58 131/65 135/67  Pulse:  61 61 60  Resp:  18  18  Temp: 98.5 F (36.9 C)   97.8 F (36.6 C)  TempSrc: Oral   Oral  SpO2:  96%  96%  Weight:    182 lb 9.6 oz (82.8 kg)  Height:        Intake/Output Summary (Last 24 hours) at 08/13/2017 0943 Last data filed at 08/13/2017 0939 Gross per 24 hour  Intake 350 ml  Output 7275 ml  Net -6925 ml   Filed Weights   08/11/17 0530 08/12/17 0554 08/13/17 0516  Weight: 198 lb 3.2 oz  (89.9 kg) 189 lb 14.4 oz (86.1 kg) 182 lb 9.6 oz (82.8 kg)    Telemetry    Sinus rhythm- Personally Reviewed  ECG    Sinus rhythm 06/21/2017 - Personally Reviewed  Physical Exam  Appears very comfortable lying almost completely supine in bed GEN: No acute distress.   Neck: No JVD Cardiac: RRR, no murmurs, rubs, or gallops.  Respiratory: Clear to auscultation bilaterally. GI: Soft, nontender, non-distended  MS:  Symmetrical 1+ pitting edema to knees bilaterally, mild pedal edema; No deformity. Neuro:  Nonfocal  Psych: Normal affect   Labs    Chemistry Recent Labs  Lab 08/11/17 0432  08/12/17 0710 08/12/17 1517 08/13/17 0502  NA 144   < > 143 144 143  K 4.0   < > 4.3 4.6 4.7  CL 106   < > 107 108 110  CO2 28   < > 26 28 25   GLUCOSE 162*   < > 177* 161* 143*  BUN 20   < > 18 22* 20  CREATININE 2.79*   < > 2.69* 2.80* 2.83*  CALCIUM 8.1*   < > 8.3* 8.4* 8.4*  ALBUMIN 1.6*  --  1.8*  --  1.8*  GFRNONAA 20*   < > 21* 20* 19*  GFRAA 23*   < > 24* 23* 23*  ANIONGAP 10   < > 10 8 8    < > = values in this interval not displayed.     Hematology Recent Labs  Lab 08/08/17 1735  WBC 10.9*  RBC 3.83*  HGB 11.4*  HCT 36.2*  MCV 94.5  MCH 29.8  MCHC 31.5  RDW 14.6  PLT 244    Cardiac EnzymesNo results for input(s): TROPONINI in the last 168 hours. No results for input(s): TROPIPOC in the last 168 hours.   BNP Recent Labs  Lab 08/08/17 1735  BNP 653.2*     DDimer No results for input(s): DDIMER in the last 168 hours.   Radiology    No results found.  Cardiac Studies  January 2 , 2019 echo  - Left ventricle: The cavity size was normal. There was mild   concentric hypertrophy. Systolic function was vigorous. The   estimated ejection fraction was in the range of 65% to 70%. Wall   motion was normal; there were no regional wall motion   abnormalities. Features are consistent with a pseudonormal left   ventricular filling pattern, with concomitant abnormal  relaxation   and increased filling pressure (grade 2 diastolic dysfunction).   Doppler parameters are consistent with high ventricular filling   pressure. - Aortic valve: There was mild stenosis. There was no   regurgitation. Peak velocity (S): 269 cm/s. Mean gradient (S): 16   mm Hg. - Mitral valve: Transvalvular velocity was within the normal range.   There was no evidence for stenosis. There was mild regurgitation. - Left atrium: The atrium was moderately dilated. - Right ventricle: The cavity size was normal. Wall thickness was   normal. Systolic function was normal. - Atrial septum: No defect or patent foramen ovale was identified. - Tricuspid valve: There was mild regurgitation. Peak RV-RA   gradient (S): 47 mm Hg. - Pulmonary arteries: Systolic pressure was moderately increased.   PA peak pressure: 50 mm Hg (S). - Pericardium, extracardiac: A small pericardial effusion was   identified.  June 21, 2017 CATH   Prox RCA to Mid RCA lesion is 30% stenosed.  Mid RCA lesion is 40% stenosed.  Dist RCA lesion is 30% stenosed.  Ost LAD to Prox LAD lesion is 100% stenosed.  LIMA graft was visualized by angiography and is normal in caliber.  Ost Cx to Prox Cx lesion is 90% stenosed.  Hemodynamic findings consistent with mild pulmonary hypertension.  LV end diastolic pressure is normal.   1. Severe double vessel CAD s/p 3V CABG with 3/3 patent bypass grafts  2. The LAD has 100% proximal occlusion. The entire LAD fills from the patent LIMA graft. The Diagonal branch fills from the vein graft 3. The Circumflex has 90% proximal stenosis. The vein graft fills the large obtuse marginal graft.  4. The RCA is a large dominant vessel with mild diffuse calcific plaque in the proximal, mid and distal vessel but no obstructive lesions.  5. The LIMA to the LAD is patent 6 The sequential saphenous vein graft to the Diagonal and OM is patent with mild filling defects seen in the proximal,  mid and distal segments of the body of the graft. These lesions do not appear to be flow limiting.  7. Mild aortic stenosis. Peak to peak gradient 4 mmHg. The valve was easily crossed.  8. Right and left heart filling pressures outline above.   Recommendations: Continue medical management of CAD. Continue aggressive blood pressure control and diuresis for diastolic CHF. Will hydrate for 6 hours post cath and plan discharge home later today.      Diagnostic Diagram        Hemo Data    Most Recent Value  Fick Cardiac Output 10.58 L/min  Fick Cardiac Output Index 5.14 (L/min)/BSA  RA A Wave 4 mmHg  RA V Wave 4 mmHg  RA Mean 1 mmHg  RV Systolic Pressure 56 mmHg  RV Diastolic Pressure -1 mmHg  RV EDP 4 mmHg  PA Systolic Pressure 49 mmHg  PA Diastolic Pressure 13 mmHg  PA Mean 29 mmHg  PW A Wave 15 mmHg  PW V Wave 20 mmHg  PW Mean 14 mmHg  AO Systolic Pressure 465 mmHg  AO Diastolic Pressure 69 mmHg  AO Mean 681 mmHg  LV Systolic Pressure 275 mmHg  LV Diastolic Pressure 2 mmHg  LV EDP 11 mmHg  Arterial Occlusion Pressure Extended Systolic Pressure 170 mmHg  Arterial Occlusion Pressure Extended Diastolic Pressure 69 mmHg  Arterial Occlusion Pressure Extended Mean Pressure 113 mmHg  Left Ventricular Apex Extended Systolic Pressure 017 mmHg  Left Ventricular Apex Extended Diastolic Pressure 3 mmHg  Left Ventricular Apex Extended EDP Pressure 13 mmHg  QP/QS 1  TPVR Index 5.65 HRUI  TSVR Index 22.4 HRUI  PVR SVR Ratio 0.13  TPVR/TSVR Ratio 0.25    Patient Profile     84 y.o. male with long-standing history of coronary artery disease and previous bypass surgery, grafts patent by recent cardiac catheterization, advanced chronic kidney disease stage IV, admitted for acute on chronic diastolic heart failure with massive volume overload. Improved after diuresis of over 35 pounds, relatively stable renal function.  Assessment & Plan    1. CHF: We will switch him to oral  diuretics starting tomorrow, monitor for 24 hours after that to make sure that he does not gain fluid back.  I think a large part of his volume excess can be attributed to kidney failure.  He presented this time with predominantly right heart failure symptoms. DC Foley. 2. CKD: Outpatient management of his diuretics will have to be carefully coordinated with Dr. Hollie Salk in the nephrology clinic. 3. CAD: All grafts patent by recent cardiac catheterization, currently without angina. 4. DM  For questions or updates, please contact [place] Please consult [email or web address] for contact info under Cardiology/STEMI.      Signed, Sanda Klein, MD  08/13/2017, 9:43 AM

## 2017-08-13 NOTE — Progress Notes (Signed)
Received report from [place], [place] on [name].  [name] denies pain.  IV Lasix changing to PO dose tomorrow.  Patient and his wife updated on plan of care.

## 2017-08-14 LAB — BASIC METABOLIC PANEL
Anion gap: 7 (ref 5–15)
BUN: 21 mg/dL — ABNORMAL HIGH (ref 6–20)
CO2: 26 mmol/L (ref 22–32)
Calcium: 8.5 mg/dL — ABNORMAL LOW (ref 8.9–10.3)
Chloride: 110 mmol/L (ref 101–111)
Creatinine, Ser: 3.03 mg/dL — ABNORMAL HIGH (ref 0.61–1.24)
GFR calc Af Amer: 21 mL/min — ABNORMAL LOW (ref >60)
GFR calc non Af Amer: 18 mL/min — ABNORMAL LOW (ref >60)
Glucose, Bld: 135 mg/dL — ABNORMAL HIGH (ref 65–99)
Potassium: 4.8 mmol/L (ref 3.5–5.1)
Sodium: 143 mmol/L (ref 135–145)

## 2017-08-14 LAB — GLUCOSE, CAPILLARY
Glucose-Capillary: 129 mg/dL — ABNORMAL HIGH (ref 65–99)
Glucose-Capillary: 203 mg/dL — ABNORMAL HIGH (ref 65–99)
Glucose-Capillary: 163 mg/dL — ABNORMAL HIGH (ref 65–99)
Glucose-Capillary: 252 mg/dL — ABNORMAL HIGH (ref 65–99)

## 2017-08-14 LAB — RENAL FUNCTION PANEL
Albumin: 1.7 g/dL — ABNORMAL LOW (ref 3.5–5.0)
Anion gap: 8 (ref 5–15)
BUN: 21 mg/dL — ABNORMAL HIGH (ref 6–20)
CO2: 26 mmol/L (ref 22–32)
Calcium: 8.5 mg/dL — ABNORMAL LOW (ref 8.9–10.3)
Chloride: 109 mmol/L (ref 101–111)
Creatinine, Ser: 2.97 mg/dL — ABNORMAL HIGH (ref 0.61–1.24)
GFR calc Af Amer: 21 mL/min — ABNORMAL LOW (ref >60)
GFR calc non Af Amer: 18 mL/min — ABNORMAL LOW (ref >60)
Glucose, Bld: 134 mg/dL — ABNORMAL HIGH (ref 65–99)
Phosphorus: 3.9 mg/dL (ref 2.5–4.6)
Potassium: 4.8 mmol/L (ref 3.5–5.1)
Sodium: 143 mmol/L (ref 135–145)

## 2017-08-14 MED ORDER — POTASSIUM CHLORIDE CRYS ER 20 MEQ PO TBCR
40.0000 meq | EXTENDED_RELEASE_TABLET | Freq: Every day | ORAL | Status: DC
  Administered 2017-08-15: 40 meq via ORAL
  Filled 2017-08-14: qty 2

## 2017-08-14 NOTE — Care Management Note (Signed)
Case Management Note  Patient Details  Name: [name] MRN: [id number] Date of Birth: 10/02/1933  Subjective/Objective:                 Spoke w patient and wife at bedside. They would like to use [facility] for [facility], would also like rollator as suggested by PT. Placed order for DME, and referral to [facility] for DME and to follow for [facility] orders.    Action/Plan:  Needs Order for [facility], and rollator to be delivered to room.    Expected Discharge Date:                  Expected Discharge Plan:  [place]  In-House Referral:     Discharge planning Services  CM Consult  Post Acute Care Choice:  Home Health, Durable Medical Equipment Choice offered to:  Patient, Spouse  DME Arranged:  [place] rolling with seat DME Agency:  [place]:  PT [place]:  [place]  Status of Service:  In process, will continue to follow  If discussed at Long Length of Stay Meetings, dates discussed:    Additional Comments:  Carles Collet, RN 08/14/2017, 1:56 PM

## 2017-08-14 NOTE — Plan of Care (Signed)
  Problem: Activity: Goal: Risk for activity intolerance will decrease Outcome: Progressing   Problem: Activity: Goal: Capacity to carry out activities will improve Outcome: Progressing   

## 2017-08-14 NOTE — Progress Notes (Signed)
  [facility] Progress Note    Assessment/ Plan:   1. 1. AoCKD4 with nephrotic proteinuria UP/C 5 further contributing.  Has transitioned to Lasix 80 mg daily PO starting today 6/18.  SPEP and free light chains WNL.  Was on irbesartan as OP- don't rec restarting now given acute on chronic CKD.  Will assess as OP if this can be readdressed. 2. Hypervolemia / dCHF exacerbation- diuresis as above 3. Hx/p prostate cancer- d/c'd Foley 4. Hypokalemia, stable, on supplementation 5. Dispo: hopefully in the next 1-2 days if we can wean o2  Subjective:    Sleeping this AM, feeling better.  Robust diuresis- have switched to PO lasix today.  Well below OP EDW.   Objective:   BP 132/63 (BP Location: Left Arm)   Pulse (!) 52   Temp 97.9 F (36.6 C) (Oral)   Resp 20   Ht 5' 11.5" (1.816 m)   Wt 78.2 kg (172 lb 8 oz)   SpO2 100%   BMI 23.72 kg/m   Intake/Output Summary (Last 24 hours) at 08/14/2017 1308 Last data filed at 08/14/2017 1301 Gross per 24 hour  Intake 1200 ml  Output 4000 ml  Net -2800 ml   Weight change: -4.581 kg (-10 lb 1.6 oz)  Physical Exam: GEN: younger than stated age, NAD HEENT: EOMI PERRL, still wearing O2 CV: RRR, 2/6 crescendo/ decrescendo murmur PULM: bibasilar crackles, R > L, improving ABD: no abd wall edema EXT: 2+ LE edema, improved    Imaging: No results found.  Labs: BMET Recent Labs  Lab 08/10/17 0413 08/11/17 0432 08/11/17 1512 08/12/17 0710 08/12/17 1517 08/13/17 0502 08/14/17 0518  NA 143 144 145 143 144 143 143  143  K 3.4* 4.0 4.1 4.3 4.6 4.7 4.8  4.8  CL 106 106 110 107 108 110 110  109  CO2 31 28 28 26 28 25 26  26   GLUCOSE 179* 162* 202* 177* 161* 143* 135*  134*  BUN 23* 20 19 18  22* 20 21*  21*  CREATININE 3.11* 2.79* 2.82* 2.69* 2.80* 2.83* 3.03*  2.97*  CALCIUM 7.7* 8.1* 8.2* 8.3* 8.4* 8.4* 8.5*  8.5*  PHOS 2.8 3.0  --  3.2  --  3.4 3.9   CBC Recent Labs  Lab 08/08/17 1735  WBC 10.9*  NEUTROABS  8.1*  HGB 11.4*  HCT 36.2*  MCV 94.5  PLT 244    Medications:    . amLODipine  10 mg Oral QHS  . aspirin EC  81 mg Oral Daily  . atorvastatin  40 mg Oral QHS  . folic acid  1 mg Oral Daily  . furosemide  80 mg Oral Daily  . glimepiride  6 mg Oral Q breakfast  . heparin  5,000 Units Subcutaneous Q8H  . hydrALAZINE  100 mg Oral TID  . insulin aspart  0-9 Units Subcutaneous TID WC  . linagliptin  5 mg Oral BH-q7a  . multivitamins with iron  1 tablet Oral Daily  . [START ON 08/15/2017] potassium chloride SA  40 mEq Oral Daily  . sodium chloride flush  10-40 mL Intracatheter Q12H  . sodium chloride flush  3 mL Intravenous Q12H  . traZODone  50 mg Oral QHS      Madelon Lips MD 08/14/2017, 1:08 PM

## 2017-08-14 NOTE — Care Management Important Message (Signed)
Important Message  Patient Details  Name: CORLEY MAFFEO MRN: [id number] Date of Birth: [birth year]   Medicare Important Message Given:  Yes    Domnique Vanegas P [place] 08/14/2017, 3:23 PM

## 2017-08-15 ENCOUNTER — Encounter (HOSPITAL_COMMUNITY): Payer: Self-pay | Admitting: Physician Assistant

## 2017-08-15 ENCOUNTER — Telehealth: Payer: Self-pay | Admitting: *Deleted

## 2017-08-15 DIAGNOSIS — R0902 Hypoxemia: Secondary | ICD-10-CM

## 2017-08-15 LAB — RENAL FUNCTION PANEL
Albumin: 1.7 g/dL — ABNORMAL LOW (ref 3.5–5.0)
Anion gap: 5 (ref 5–15)
BUN: 21 mg/dL — ABNORMAL HIGH (ref 6–20)
CO2: 24 mmol/L (ref 22–32)
Calcium: 8.1 mg/dL — ABNORMAL LOW (ref 8.9–10.3)
Chloride: 113 mmol/L — ABNORMAL HIGH (ref 101–111)
Creatinine, Ser: 2.93 mg/dL — ABNORMAL HIGH (ref 0.61–1.24)
GFR calc Af Amer: 22 mL/min — ABNORMAL LOW (ref >60)
GFR calc non Af Amer: 19 mL/min — ABNORMAL LOW (ref >60)
Glucose, Bld: 161 mg/dL — ABNORMAL HIGH (ref 65–99)
Phosphorus: 4.1 mg/dL (ref 2.5–4.6)
Potassium: 4.5 mmol/L (ref 3.5–5.1)
Sodium: 142 mmol/L (ref 135–145)

## 2017-08-15 LAB — GLUCOSE, CAPILLARY
Glucose-Capillary: 139 mg/dL — ABNORMAL HIGH (ref 65–99)
Glucose-Capillary: 179 mg/dL — ABNORMAL HIGH (ref 65–99)

## 2017-08-15 MED ORDER — POTASSIUM CHLORIDE CRYS ER 20 MEQ PO TBCR: 20.0000 meq | EXTENDED_RELEASE_TABLET | Freq: Every day | ORAL | Status: DC

## 2017-08-15 MED ORDER — POTASSIUM CHLORIDE CRYS ER 20 MEQ PO TBCR: 40.0000 meq | EXTENDED_RELEASE_TABLET | Freq: Every day | ORAL | Status: DC

## 2017-08-15 MED ORDER — FUROSEMIDE 40 MG PO TABS: 80.0000 mg | ORAL_TABLET | Freq: Every day | ORAL | 2 refills | Status: DC

## 2017-08-15 NOTE — Discharge Summary (Signed)
Discharge Summary    Patient ID: [name],  MRN: [id number], DOB/AGE: 1933-11-17 84 y.o.  Admit date: 08/08/2017 Discharge date: 08/15/2017  Primary Care Provider: Jani Gravel Primary Cardiologist: Dr. Debara Pickett  Discharge Diagnoses    Principal Problem:   Acute on chronic diastolic CHF (congestive heart failure) ([place]) Active Problems:   Acute kidney injury superimposed on chronic kidney disease ([place])   Essential hypertension   Dyslipidemia   DM2 (diabetes mellitus, type 2) ([place])   Difficulty urinating   Nephrotic range proteinuria   Hypoxia   Diagnostic Studies/Procedures    N/A _____________     History of Present Illness     [name] is a 84 y.o. male with history of 84 y.o. male with CAD s/p CABG x 3 1980s (stable cath with patent grafts 05/2017), mild aortic stenosis, CKD stage IV with proteinura, mild pulm HTN, chronic diastolic CHF, DM2 who was admitted 08/08/2017 with marked volume overload, orthopnea, >20lb weight gain (proved to be more like 47lb), abdominal/LE/sacral edema.  Hospital Course    1. Volume overload - this was felt likely multifactorial from acute on chronic diastolic CHF but also more likely driven by acute on CKD stage IV with nephrotic range proteinuria/hypoalbuminemia - nephrology assisted with management during stay. He was treated with IV Lasix 76m q8hours with significant diuresis. SPEP and free light chains were normal. He was on irbesartan as an outpatient but renal did not recommend restarting now given acute CKD. They will assess as OP if this can be readdressed. He did require a foley for urinary hesitancy on admission but foley was discontinued and he is urinating freely. Weight is now greatly below what it was when he was previously felt to be at baseline, so likely moving target. Diuresed 217->170lb with -25L thus far. Now doing well on oral diuretics in the form of Lasix 839mdaily. Potassium dose was initially titrated to TID for  hypokalemia but then decreased at discharge to 4073mgiven K 4.5-4.8 (d/w MD). Creatinine has remained fairly stable this admission, currently 2.93 at discharge. There has been a general uptrend noted by renal since earlier this year. Per Dr. CroSallyanne Kuster was asked to call the office if weight exceeds 175lb, as well as any signs of increasing volume overload.  2. CAD s/p prior CABG - Hx of CABG with recent cath in June 21, 2017 with patent grafts. Normal LV function with mildly elevated pulmonary pressure. Continue aspirin, statin. Bystolic was added to home med list after admission but patient reported taking some time in the last week. HR was in the 50s during admission so this was not continued.  3. Hypoxia on admission - hypoxic in the 60s on RA on admission. This resolved with diuresis. Will not need home O2 on DC per PT assessment.  4. Deconditioning - will order HHPT as advised by PT/CM. Rolling walker was also ordered this admission as well.  TOC visit scheduled 6/26 at 2pm. Would recommend BMET when seen during that visit. Dr. CroSallyanne Kusters seen and examined the patient today and feels he is stable for discharge. He also has f/u with renal 08/27/17 with check-in at 10:15.    Consultants: Nephrology _____________  Discharge Vitals Blood pressure (!) 113/59, pulse (!) 53, temperature 98.2 F (36.8 C), temperature source Oral, resp. rate 18, height 5' 11.5" (1.816 m), weight 170 lb 11.2 oz (77.4 kg), SpO2 98 %.  Filed Weights   08/13/17 0516 08/14/17 0441 08/15/17 0608  Weight:  182 lb 9.6 oz (82.8 kg) 172 lb 8 oz (78.2 kg) 170 lb 11.2 oz (77.4 kg)    Labs & Radiologic Studies    Basic Metabolic Panel Recent Labs    08/14/17 0518 08/15/17 0520  NA 143  143 142  K 4.8  4.8 4.5  CL 110  109 113*  CO2 _0 GLUCOSE 135*  134* 161*  BUN 21*  21* 21*  CREATININE 3.03*  2.97* 2.93*  CALCIUM 8.5*  8.5* 8.1*  PHOS 3.9 4.1   Liver Function Tests Recent Labs     08/14/17 0518 08/15/17 0520  ALBUMIN 1.7* 1.7*    Dg Chest 2 View  Result Date: 08/09/2017 CLINICAL DATA:  CHF. EXAM: CHEST - 2 VIEW COMPARISON:  06/20/2017. FINDINGS: Prior median sternotomy. Cardiomegaly. Diffuse bilateral pulmonary infiltrates/edema. Interim progression from prior exam. Atelectatic changes right lung base. Right-sided pleural effusion again noted. No pneumothorax. IMPRESSION: 1. Progressive bilateral pulmonary infiltrates and or edema. Persistent right-sided pleural effusion. 2.  Right base atelectatic changes. 3.  Prior median sternotomy and CABG.  Cardiomegaly. Electronically Signed   By: Marcello Moores  Register   On: 08/09/2017 07:54   Disposition   Pt is being discharged home today in good condition.  Follow-up Plans & Appointments    Follow-up Information    Lendon Colonel, NP Follow up.   Specialties:  Nurse Practitioner, Radiology, Cardiology Why:  [facility] - 08/22/17 at 2pm. Arrive 15 minutes prior to appointment to check in. [name] is one of the nurse practitioners that works with Dr. Debara Pickett (you met her the day you were admitted). Contact information: [address] [place] [address] [phone]        Madelon Lips, MD Follow up.   Specialty:  Nephrology Why:  Nephrology - 08/27/17 with check in at 10:15. Contact information: [place] [address] [phone]          Discharge Instructions    Diet - low sodium heart healthy   Complete by:  As directed    Discharge instructions   Complete by:  As directed    Please call the office if your weight goes up to 175lb or higher.  Your Bystolic (nebivolol) and irbesartan were stopped.   Increase activity slowly   Complete by:  As directed       Discharge Medications   Allergies as of 08/15/2017   No Known Allergies     Medication List    STOP taking these medications   BYSTOLIC 10 MG tablet Generic drug:  nebivolol   irbesartan 300 MG  tablet Commonly known as:  AVAPRO     TAKE these medications   amLODipine 10 MG tablet Commonly known as:  NORVASC Take 10 mg by mouth at bedtime.   aspirin EC 81 MG tablet Take 81 mg by mouth daily.   atorvastatin 40 MG tablet Commonly known as:  LIPITOR Take 40 mg by mouth at bedtime.   carboxymethylcellulose 0.5 % Soln Commonly known as:  REFRESH PLUS Place 1 drop into both eyes 3 (three) times daily as needed (dry, itch).   folic acid 1 MG tablet Commonly known as:  FOLVITE Take 1 mg by mouth daily.   furosemide 40 MG tablet Commonly known as:  LASIX Take 2 tablets (80 mg total) by mouth daily. What changed:    how much to take  when to take this   glimepiride 4 MG tablet Commonly known as:  AMARYL  Take 6 mg by mouth daily with breakfast.   hydrALAZINE 100 MG tablet Commonly known as:  APRESOLINE Take 100 mg by mouth 3 (three) times daily.   multivitamin tablet Take 1 tablet by mouth daily.   OVER THE COUNTER MEDICATION Place 1 spray into the nose daily as needed. Sovereign Silver Nasal Spray for Immune Support   potassium chloride SA 20 MEQ tablet Commonly known as:  KLOR-CON M20 Take 2 tablets (40 mEq total) by mouth daily.   TRADJENTA 5 MG Tabs tablet Generic drug:  linagliptin Take 5 mg by mouth every morning.   traZODone 50 MG tablet Commonly known as:  DESYREL Take 50 mg by mouth at bedtime. mg            Durable Medical Equipment  (From admission, onward)        Start     Ordered   08/14/17 1400  For home use only DME 4 wheeled rolling walker with seat  Once    Question:  Patient needs a walker to treat with the following condition  Answer:  Weakness   08/14/17 1359       Allergies:  No Known Allergies   Outstanding Labs/Studies   Recommend provider obtain BMET at f/u visit.  Duration of Discharge Encounter   Greater than 30 minutes including physician time.  Signed, Charlie Pitter PA-C 08/15/2017, 11:50 AM

## 2017-08-15 NOTE — Progress Notes (Addendum)
Progress Note  Patient Name: DAYVIN ABER Date of Encounter: 08/15/2017  Primary Cardiologist: Pixie Casino, MD  Subjective   Feeling much better. No CP or SOB. Edema markedly improved. Per d/w PT, able to maintain O2 sats while on ambulation - brief drop to 89% on RA with peak exertion. Has been averaging 99-100% on RA at rest. Ready to go. Foley has been out several day without acte urinary stream issues.  Inpatient Medications    Scheduled Meds: . amLODipine  10 mg Oral QHS  . aspirin EC  81 mg Oral Daily  . atorvastatin  40 mg Oral QHS  . folic acid  1 mg Oral Daily  . furosemide  80 mg Oral Daily  . glimepiride  6 mg Oral Q breakfast  . heparin  5,000 Units Subcutaneous Q8H  . hydrALAZINE  100 mg Oral TID  . insulin aspart  0-9 Units Subcutaneous TID WC  . linagliptin  5 mg Oral BH-q7a  . multivitamins with iron  1 tablet Oral Daily  . potassium chloride SA  40 mEq Oral Daily  . sodium chloride flush  10-40 mL Intracatheter Q12H  . sodium chloride flush  3 mL Intravenous Q12H  . traZODone  50 mg Oral QHS   Continuous Infusions: . sodium chloride     PRN Meds: sodium chloride, acetaminophen, alum & mag hydroxide-simeth, ondansetron (ZOFRAN) IV, sodium chloride flush, sodium chloride flush   Vital Signs    Vitals:   08/14/17 1212 08/14/17 1658 08/14/17 2119 08/15/17 0608  BP: 132/63 126/71 (!) 138/56 (!) 121/59  Pulse: (!) 52 (!) 55 (!) 57 (!) 51  Resp: 20 19    Temp: 97.9 F (36.6 C)  98 F (36.7 C) 98.3 F (36.8 C)  TempSrc: Oral     SpO2: 100% 99% 99% 100%  Weight:    170 lb 11.2 oz (77.4 kg)  Height:        Intake/Output Summary (Last 24 hours) at 08/15/2017 1029 Last data filed at 08/15/2017 0654 Gross per 24 hour  Intake 720 ml  Output 1925 ml  Net -1205 ml   Filed Weights   08/13/17 0516 08/14/17 0441 08/15/17 0608  Weight: 182 lb 9.6 oz (82.8 kg) 172 lb 8 oz (78.2 kg) 170 lb 11.2 oz (77.4 kg)    Telemetry    Sinus brady 50s-60s.  Brief run of atrial tach- Personally Reviewed  Physical Exam   GEN: No acute distress.  HEENT: Normocephalic, atraumatic, sclera non-icteric. Neck: No JVD or bruits. Cardiac: RRR. 2/6 SEM. No rubs or gallops.  Radials/DP/PT 1+ and equal bilaterally.  Respiratory: Clear to auscultation bilaterally. Breathing is unlabored. GI: Soft, nontender, non-distended, BS +x 4. MS: no deformity. Extremities: No clubbing or cyanosis. Trace pitting BLE edema. Distal pedal pulses are 2+ and equal bilaterally. Neuro:  AAOx3. Follows commands. Psych:  Responds to questions appropriately with a normal affect.  Labs    Chemistry Recent Labs  Lab 08/13/17 0502 08/14/17 0518 08/15/17 0520  NA 143 143  143 142  K 4.7 4.8  4.8 4.5  CL 110 110  109 113*  CO2 25 26  26 24   GLUCOSE 143* 135*  134* 161*  BUN 20 21*  21* 21*  CREATININE 2.83* 3.03*  2.97* 2.93*  CALCIUM 8.4* 8.5*  8.5* 8.1*  ALBUMIN 1.8* 1.7* 1.7*  GFRNONAA 19* 18*  18* 19*  GFRAA 23* 21*  21* 22*  ANIONGAP 8 7  8  5  Hematology Recent Labs  Lab 08/08/17 1735  WBC 10.9*  RBC 3.83*  HGB 11.4*  HCT 36.2*  MCV 94.5  MCH 29.8  MCHC 31.5  RDW 14.6  PLT 244    Cardiac EnzymesNo results for input(s): TROPONINI in the last 168 hours. No results for input(s): TROPIPOC in the last 168 hours.   BNP Recent Labs  Lab 08/08/17 1735  BNP 653.2*     DDimer No results for input(s): DDIMER in the last 168 hours.   Radiology    No results found.   Patient Profile     84 y.o. male with CAD s/p CABG x 3 1980s (stable cath with patent grafts 05/2017), mild aortic stenosis, CKD stage IV with proteinura, mild pulm HTN, chronic diastolic CHF, DM2 who was admitted 08/08/2017 with marked volume overload, orthopnea, >20lb weight gain (proved to be more like 47lb), abdominal/LE/sacral edema.  Assessment & Plan    1. Volume overload likely multifactorial from acute on chronic diastolic CHF and acute on CKD stage IV with  nephrotic range proteinuria/hypoalbuminemia - appreciate renal input. Weight is now greatly below what it was when he was previously felt to be at baseline, so likely moving target. Diuresed 217->170lb with -25L thus far. Now doing well on oral diuretics. Will decrease potassium dose to 34meq daily given K 4.5-4.8. Creatinine has remained fairly stable this admission, currently 2.93. General uptrend noted by renal since earlier this year. Will review dc med plans with MD.  2. CAD s/p prior CABG - Hx of CABG with recent cath in June 21, 2017 with patent grafts. Normal LV function with mildly elevated pulmonary pressure. Continue aspirin, statin.  3. Hypoxia on admission - hypoxic in the 60s on RA on admission. Will not need home O2 on DC per PT assessment.  4. Deconditioning - will order HHPT as advised by PT/CM. Rolling walker order was already in.  TOC visit scheduled 6/26 at 2pm.  For questions or updates, please contact [facility] Please consult [email or web address] for contact info under Cardiology/STEMI.  Signed, Charlie Pitter, PA-C 08/15/2017, 10:29 AM    I have seen and examined the patient along with Charlie Pitter, PA-C.  I have reviewed the chart, notes and new data.  I agree with PA/NP's note.  Key new complaints: feels much better Key examination changes: no edema or JVD Key new findings / data: creat essentially stable at 2.9-3.0  PLAN: I would continue with his current dose of diuretics and potassium supplement, keep off ARB.  He has a follow-up with [place] in a week and with Dr. Hollie Salk in just a couple of weeks and labs can be rechecked at that time.  Would target a home weight of less than 175 pounds, with adjustment in diuretic dose if he ever exceeds this.  Recommended for full adherence to daily weights and he should call our office if he ever exceeds 175 pounds.  Reviewed sodium restriction and the signs and symptoms of heart failure exacerbation. Action improves (I think this would  not be expected), can try to restart ARB.  Otherwise, if blood pressure allows could use hydralazine nitrates as additional treatment for his congestive heart failure.  Sanda Klein, MD, [place] [phone] 08/15/2017, 11:21 AM

## 2017-08-15 NOTE — Progress Notes (Signed)
  [facility] Progress Note    Assessment/ Plan:   1. 1. AoCKD4 with nephrotic proteinuria UP/C 5 further contributing.  Has transitioned to Lasix 80 mg daily PO starting today 6/18 and is doing well on that.  SPEP and free light chains WNL.  Was on irbesartan as OP- don't rec restarting now given acute on chronic CKD.  Will assess as OP if this can be readdressed.  I'm OK with him going today with close followup with me.  He has an appointment 08/27/17 with check-in at 10:15.   2. Hypervolemia / dCHF exacerbation- diuresis as above 3. Hx/p prostate cancer- d/c'd Foley 4. Hypokalemia, stable, on supplementation 5. Dispo: OK from renal perspective to d/c today.  Subjective:    Doing well on PO Lasix.  Off O2.  Foley out.   Objective:   BP (!) 121/59 (BP Location: Right Arm)   Pulse (!) 51   Temp 98.3 F (36.8 C)   Resp 19   Ht 5' 11.5" (1.816 m)   Wt 77.4 kg (170 lb 11.2 oz)   SpO2 90%   BMI 23.48 kg/m   Intake/Output Summary (Last 24 hours) at 08/15/2017 1115 Last data filed at 08/15/2017 0654 Gross per 24 hour  Intake 720 ml  Output 1925 ml  Net -1205 ml   Weight change: -0.816 kg (-1 lb 12.8 oz)  Physical Exam: GEN: younger than stated age, NAD HEENT: EOMI PERRL, off O2 CV: RRR, 2/6 crescendo/ decrescendo murmur PULM: crackles much improved ABD: no abd wall edema EXT: 1++ LE edema, much improved    Imaging: No results found.  Labs: BMET Recent Labs  Lab 08/10/17 0413 08/11/17 0432 08/11/17 1512 08/12/17 0710 08/12/17 1517 08/13/17 0502 08/14/17 0518 08/15/17 0520  NA 143 144 145 143 144 143 143  143 142  K 3.4* 4.0 4.1 4.3 4.6 4.7 4.8  4.8 4.5  CL 106 106 110 107 108 110 110  109 113*  CO2 31 28 28 26 28 25 26  26 24   GLUCOSE 179* 162* 202* 177* 161* 143* 135*  134* 161*  BUN 23* 20 19 18  22* 20 21*  21* 21*  CREATININE 3.11* 2.79* 2.82* 2.69* 2.80* 2.83* 3.03*  2.97* 2.93*  CALCIUM 7.7* 8.1* 8.2* 8.3* 8.4* 8.4* 8.5*  8.5* 8.1*   PHOS 2.8 3.0  --  3.2  --  3.4 3.9 4.1   CBC Recent Labs  Lab 08/08/17 1735  WBC 10.9*  NEUTROABS 8.1*  HGB 11.4*  HCT 36.2*  MCV 94.5  PLT 244    Medications:    . amLODipine  10 mg Oral QHS  . aspirin EC  81 mg Oral Daily  . atorvastatin  40 mg Oral QHS  . folic acid  1 mg Oral Daily  . furosemide  80 mg Oral Daily  . glimepiride  6 mg Oral Q breakfast  . heparin  5,000 Units Subcutaneous Q8H  . hydrALAZINE  100 mg Oral TID  . insulin aspart  0-9 Units Subcutaneous TID WC  . linagliptin  5 mg Oral BH-q7a  . multivitamins with iron  1 tablet Oral Daily  . [START ON 08/16/2017] potassium chloride SA  20 mEq Oral Daily  . sodium chloride flush  10-40 mL Intracatheter Q12H  . sodium chloride flush  3 mL Intravenous Q12H  . traZODone  50 mg Oral QHS      Madelon Lips MD 08/15/2017, 11:15 AM

## 2017-08-15 NOTE — Telephone Encounter (Signed)
-----   Message from Charlie Pitter, [place] sent at 08/15/2017 10:52 AM EDT ----- Regarding: Will need TOC call. Needs TOC phone call - being dc later today, has f/u 6/26 with K. Purcell Nails. Thanks! Dayna Dunn PA-C

## 2017-08-15 NOTE — Progress Notes (Signed)
Physical Therapy Treatment Patient Details Name: [name] MRN: [id number] DOB: 10/18/1933 Today's Date: 08/15/2017    History of Present Illness 84 y.o. male with pmhx of dCHF, CAD s/p CABG (1980s), mild AV stenosis, CKD IV and diabetes admitted directly from cardiology clinic with 24 lb weight gain, anasarca to his umbilicus, and hypoxia to 60s on RA.     PT Comments    Pt pleasant and agreeable to PT with spouse present and supportive. Pt ambulated and performed stairs as necessary for home setup with supervision. SpO2% monitored throughout session and found to be 90-95%. Pt educated regarding breathing technique and SpO2 monitoring at home. Pt goals and education met and completed. Pt expects to be D/C today (08-15-17).  Follow Up Recommendations  Home health PT;Supervision for mobility/OOB     Equipment Recommendations  Other (comment)(Rollator)    Recommendations for Other Services       Precautions / Restrictions Precautions Precautions: Fall Precaution Comments: watch O2 sats Restrictions Weight Bearing Restrictions: No    Mobility  Bed Mobility Overal bed mobility: Modified Independent             General bed mobility comments: HOB Elevated and mildly increased time  Transfers Overall transfer level: Modified independent Equipment used: None Transfers: Sit to/from Stand Sit to Stand: Modified independent (Device/Increase time)         General transfer comment: Mildly increased time  Ambulation/Gait Ambulation/Gait assistance: Supervision Gait Distance (Feet): 175 Feet Assistive device: 4-wheeled walker;None(Rollator) Gait Pattern/deviations: Step-through pattern;Decreased stride length Gait velocity: Decreased   General Gait Details: Pt ambulates without instability or LOB when using rollator. When transitioning from stairs to rollator, pt had posterior-right lean with ability to correct. Pt ambulated in room using furniture for stability without  LOB. SPO2% monitored throughout session and found to be 90-95% on RA. Pt educated regarding breathing technique to increase SpO2% as well as importance of periodically monitoring O2 saturation at home.    Stairs Stairs: Yes Stairs assistance: Supervision Stair Management: Alternating pattern;Sideways;One rail Right;Step to pattern Number of Stairs: 9 General stair comments: Pt ascended 9 stairs using alternating pattern and Right hand rail min guard. Pt demonstrated decreased stability with descending stairs as pt turned sideways with step to pattern with BUE on railing. Pt's spouse educated regarding guarding technique.   Wheelchair Mobility    Modified Rankin (Stroke Patients Only)       Balance Overall balance assessment: Needs assistance Sitting-balance support: No upper extremity supported;Feet supported Sitting balance-Leahy Scale: Good Sitting balance - Comments: Not formally tested     Standing balance-Leahy Scale: Fair Standing balance comment: can stand static without UE support; evidence of instability with challenge if not UE support                            Cognition Arousal/Alertness: Awake/alert Behavior During Therapy: WFL for tasks assessed/performed Overall Cognitive Status: Within Functional Limits for tasks assessed                                        Exercises      General Comments General comments (skin integrity, edema, etc.): Pt spouse present throughout session and supportive      Pertinent Vitals/Pain Pain Assessment: No/denies pain    Home Living  Prior Function            PT Goals (current goals can now be found in the care plan section) Acute Rehab PT Goals Patient Stated Goal: go home when ready PT Goal Formulation: With patient/family Time For Goal Achievement: 08/29/17 Potential to Achieve Goals: Good Progress towards PT goals: Goals met and updated - see care  plan;Goals met/education completed, patient discharged from PT    Frequency    Other (Comment)(Goals met)      PT Plan Current plan remains appropriate;Other (comment)(Goals met)    Co-evaluation              AM-PAC PT "6 Clicks" Daily Activity  Outcome Measure  Difficulty turning over in bed (including adjusting bedclothes, sheets and blankets)?: None Difficulty moving from lying on back to sitting on the side of the bed? : None Difficulty sitting down on and standing up from a chair with arms (e.g., wheelchair, bedside commode, etc,.)?: None Help needed moving to and from a bed to chair (including a wheelchair)?: A Little Help needed walking in hospital room?: A Little Help needed climbing 3-5 steps with a railing? : A Little 6 Click Score: 21    End of Session Equipment Utilized During Treatment: Gait belt Activity Tolerance: Patient tolerated treatment well Patient left: in chair;with call bell/phone within reach;with family/visitor present Nurse Communication: Mobility status;Other (comment)(O2 removal) PT Visit Diagnosis: Unsteadiness on feet (R26.81);Difficulty in walking, not elsewhere classified (R26.2)     Time: 8372-9021 PT Time Calculation (min) (ACUTE ONLY): 16 min  Charges:  $Gait Training: 8-22 mins                    G Codes:       Gabe Idelia Caudell, SPT   [employer] 08/15/2017, 12:26 PM

## 2017-08-15 NOTE — Progress Notes (Signed)
Inpatient Diabetes Program Recommendations  AACE/ADA: New Consensus Statement on Inpatient Glycemic Control (2015)  Target Ranges:  Prepandial:   less than 140 mg/dL      Peak postprandial:   less than 180 mg/dL (1-2 hours)      Critically ill patients:  140 - 180 mg/dL   Lab Results  Component Value Date   GLUCAP 139 (H) 08/15/2017   HGBA1C 8.6 (H) 08/09/2017    Review of Glycemic Control Results for EDINSON, DOMEIER (MRN [id number]) as of 08/15/2017 09:46  Ref. Range 08/14/2017 16:41 08/14/2017 21:18 08/15/2017 07:40  Glucose-Capillary Latest Ref Range: 65 - 99 mg/dL 163 (H) 252 (H) 139 (H)   Diabetes history: Type 2 DM Outpatient Diabetes medications: Amaryl 6 mg QAM, Tradjenta 5 mg QAM Current orders for Inpatien glycemic control: Novolog 0-9 units TID, Amaryl 6 mg QAM, Tradjenta 5 mg QAM  Inpatient Diabetes Program Recommendations:    Could consider adding Novolog 0-5 units QHS.   Thanks, Bronson Curb, MSN, RNC-OB Diabetes Coordinator [phone] (8a-5p)

## 2017-08-16 ENCOUNTER — Encounter: Payer: Self-pay | Admitting: *Deleted

## 2017-08-16 NOTE — Telephone Encounter (Signed)
Patient's wife [name] contacted regarding discharge from [facility] on 08/15/17.  Patient understands to follow up with provider LAWRENCE on 08/22/17  at 2 PM at [facility]. Patient understands discharge instructions? yes  Patient understands medications and regiment? yes  Patient understands to bring all medications to this visit? yes

## 2017-08-20 DIAGNOSIS — R609 Edema, unspecified: Secondary | ICD-10-CM | POA: Diagnosis not present

## 2017-08-20 DIAGNOSIS — I10 Essential (primary) hypertension: Secondary | ICD-10-CM | POA: Diagnosis not present

## 2017-08-20 DIAGNOSIS — E1122 Type 2 diabetes mellitus with diabetic chronic kidney disease: Secondary | ICD-10-CM | POA: Diagnosis not present

## 2017-08-21 NOTE — Progress Notes (Signed)
Cardiology Office Note   Date:  08/22/2017   ID:  [name], DOB 12/29/1933, MRN [id number]  PCP:  Jani Gravel, MD  Cardiologist:  Dr. Debara Pickett  Chief Complaint  Patient presents with  . Transitions Of Care     History of Present Illness: [name] is a 84 y.o. male who presents for posthospitalization follow-up after admission for marked volume overload, chronic diastolic heart failure, anasarca, with other history to include hypertension, type 2 diabetes, chronic kidney disease stage IV, history of CAD status post CABG x3 with most recent catheterization revealed patent grafts on 05/2017.  During hospitalization volume overload was found to be multifactorial and most likely driven by acute chronic kidney disease stage IV with nephrotic range, proteinuria and hypoalbuminemia.  Nephrology was consulted and assisted with treatment during his hospital stay.  He was treated with IV Lasix 80 mg every 8 hours, irbesartan was discontinued.  He diuresis for weight of 217 pounds to 170 pounds with 25 L diuresis.  The patient was sent home on oral diuresis with Lasix 80 mg daily, and potassium 40 mEq daily.  Creatinine on discharge 2.93.  His dry weight was not to exceed 175 pounds.  He is here for TOC follow-up.  It is noted that he had significant deconditioning and home health physical therapy was advised along with a rolling walker.  BMET will need to be ordered during this office visit.   He is doing very well today. Feeling much better, breathing better, able to walk and breath normally. He denies chest pain. He is medically complaint and has avoided salt. He comes with multiple questions. He is due to see nephrology in one week for labs.    Past Medical History:  Diagnosis Date  . Arthritis    "a little in LLE" (06/20/2017)  . Chronic diastolic CHF (congestive heart failure) ([place])   . CKD (chronic kidney disease), stage IV ([place])   . Coronary artery disease    a. s/p CABG 1980s. b.  stable by cath 05/2017.  Marland Kitchen Dyslipidemia   . Edema 08/08/2017   HANDS & LOWER EXTREMITES  . Hypertension   . Hypoalbuminemia   . Nephrotic range proteinuria   . Peripheral artery disease (H[facility])    mild  . Pneumonia ~ 1950 X 1  . Prostate cancer ([place]) ~ 2015  . Type II diabetes mellitus ([place])     Past Surgical History:  Procedure Laterality Date  . CARDIAC CATHETERIZATION     "a couple times" (06/20/2017)  . CATARACT EXTRACTION W/ INTRAOCULAR LENS  IMPLANT, BILATERAL Bilateral   . CORONARY ARTERY BYPASS GRAFT  03/06/1985   LAD and first diagonal/circumflex (sequential saphenous vein graft,cardioplegia  . INSERTION PROSTATE RADIATION SEED  ~ 2015  . NM MYOCAR PERF WALL MOTION  09/12/2006   no ischemia  . RIGHT/LEFT HEART CATH AND CORONARY/GRAFT ANGIOGRAPHY N/A 06/21/2017   Procedure: RIGHT/LEFT HEART CATH AND CORONARY/GRAFT ANGIOGRAPHY;  Surgeon: Burnell Blanks, MD;  Location: [facility];  Service: Cardiovascular;  Laterality: N/A;  . US ECHOCARDIOGRAPHY  11/04/2008   trace TR & MR     Current Outpatient Medications  Medication Sig Dispense Refill  . amLODipine (NORVASC) 10 MG tablet Take 10 mg by mouth at bedtime.    Marland Kitchen aspirin EC 81 MG tablet Take 81 mg by mouth daily.    Marland Kitchen atorvastatin (LIPITOR) 40 MG tablet Take 40 mg by mouth at bedtime.     . carboxymethylcellulose (REFRESH PLUS) 0.5 %  SOLN Place 1 drop into both eyes 3 (three) times daily as needed (dry, itch).    . folic acid (FOLVITE) 798 MCG tablet Take 800 mcg by mouth daily.    . furosemide (LASIX) 40 MG tablet Take 40 mg by mouth 2 (two) times daily.    Marland Kitchen glimepiride (AMARYL) 4 MG tablet Take 4 mg by mouth daily with breakfast.     . hydrALAZINE (APRESOLINE) 100 MG tablet Take 100 mg by mouth 3 (three) times daily.    . Multiple Vitamin (MULTIVITAMIN) tablet Take 1 tablet by mouth daily.     . Multiple Vitamins-Minerals (MULTIVITAMIN ADULTS PO) Take 1 tablet by mouth every morning.    Marland Kitchen OVER THE COUNTER  MEDICATION Place 1 spray into the nose daily as needed. Sovereign Silver Nasal Spray for Immune Support    . OVER THE COUNTER MEDICATION Take 1 capsule by mouth every morning.    . potassium chloride SA (K-DUR,KLOR-CON) 20 MEQ tablet Take 40 mEq by mouth 2 (two) times daily.    . TRADJENTA 5 MG TABS tablet Take 5 mg by mouth every morning.     . traZODone (DESYREL) 50 MG tablet Take 50 mg by mouth at bedtime. mg  12   No current facility-administered medications for this visit.     Allergies:   Patient has no known allergies.    Social History:  The patient  reports that he quit smoking about 45 years ago. His smoking use included cigarettes. He has a 20.00 pack-year smoking history. He has never used smokeless tobacco. He reports that he drank alcohol. He reports that he does not use drugs.   Family History:  The patient's family history includes Heart attack in his mother.    ROS: All other systems are reviewed and negative. Unless otherwise mentioned in H&P    PHYSICAL EXAM: VS:  BP (!) 142/68   Pulse (!) 58   Ht 5' 11.5" (1.816 m)   Wt 172 lb (78 kg)   BMI 23.65 kg/m  , BMI Body mass index is 23.65 kg/m. GEN: Well nourished, well developed, in no acute distress  HEENT: normal  Neck: no JVD, carotid bruits, or masses Cardiac: RRR; no murmurs, rubs, or gallops,no edema  Respiratory:  clear to auscultation bilaterally, normal work of breathing GI: soft, nontender, nondistended, + BS MS: no deformity or atrophy  Skin: warm and dry, no rash Neuro:  Strength and sensation are intact Psych: euthymic mood, full affect   EKG:  Not completed during this office visit.   Recent Labs: 08-Mar-2017: NT-Pro BNP 1,277 08/08/2017: B Natriuretic Peptide 653.2; Hemoglobin 11.4; Platelets 244; TSH 4.171 08/12/2017: Magnesium 1.8 08/15/2017: BUN 21; Creatinine, Ser 2.93; Potassium 4.5; Sodium 142    Lipid Panel No results found for: CHOL, TRIG, HDL, CHOLHDL, VLDL, LDLCALC, LDLDIRECT     Wt Readings from Last 3 Encounters:  08/22/17 172 lb (78 kg)  08/15/17 170 lb 11.2 oz (77.4 kg)  08/08/17 216 lb 9.6 oz (98.2 kg)      Other studies Reviewed: Echocardiogram 03/08/2017 Left ventricle: The cavity size was normal. There was mild   concentric hypertrophy. Systolic function was vigorous. The   estimated ejection fraction was in the range of 65% to 70%. Wall   motion was normal; there were no regional wall motion   abnormalities. Features are consistent with a pseudonormal left   ventricular filling pattern, with concomitant abnormal relaxation   and increased filling pressure (grade 2 diastolic dysfunction).  Doppler parameters are consistent with high ventricular filling   pressure. - Aortic valve: There was mild stenosis. There was no   regurgitation. Peak velocity (S): 269 cm/s. Mean gradient (S): 16   mm Hg. - Mitral valve: Transvalvular velocity was within the normal range.   There was no evidence for stenosis. There was mild regurgitation. - Left atrium: The atrium was moderately dilated. - Right ventricle: The cavity size was normal. Wall thickness was   normal. Systolic function was normal. - Atrial septum: No defect or patent foramen ovale was identified. - Tricuspid valve: There was mild regurgitation. Peak RV-RA   gradient (S): 47 mm Hg. - Pulmonary arteries: Systolic pressure was moderately increased.   PA peak pressure: 50 mm Hg (S). - Pericardium, extracardiac: A small pericardial effusion was   identified.   ASSESSMENT AND PLAN:  1. Diastolic CHF: He has had recent echo with evidence of Grade II diastolic dysfunction. He will be continued on current regimen with diuretic, He is to weigh daily at the same time every day. I have answered multiple questions concerning his diagnosis and medication regimen.   2. CKD Stage IV: Most recent creatinine 2.93 on 08/15/2017. He is to follow nephrology in one week. They are drawing labs on that office visit.    3. Hypertension: BP is moderately controlled currently. May need to consider increasing his antihypertensives on next visit. He is to continue low sodium diet.  He is to continue hydralazine and amlodipine. May consider increasing dose of hydralazine on next visit.   4. Hypercholesterolemia:  Continue statin therapy.   Current medicines are reviewed at length with the patient today.    Labs/ tests ordered today include: None (planned to do BMET but nephrology is ordering).   [name], ANP, AACC   08/22/2017 3:23 PM    [facility] [address], [place], [place] [address] Phone: [phone]; Fax: [phone]

## 2017-08-22 ENCOUNTER — Ambulatory Visit: Payer: Medicare Other | Admitting: Adult Health

## 2017-08-22 ENCOUNTER — Encounter: Payer: Self-pay | Admitting: Adult Health

## 2017-08-22 VITALS — BP 142/68 | HR 58 | Ht 71.5 in | Wt 172.0 lb

## 2017-08-22 DIAGNOSIS — I5032 Chronic diastolic (congestive) heart failure: Secondary | ICD-10-CM | POA: Diagnosis not present

## 2017-08-22 DIAGNOSIS — R809 Proteinuria, unspecified: Secondary | ICD-10-CM | POA: Diagnosis not present

## 2017-08-22 DIAGNOSIS — I10 Essential (primary) hypertension: Secondary | ICD-10-CM | POA: Diagnosis not present

## 2017-08-22 DIAGNOSIS — Z79899 Other long term (current) drug therapy: Secondary | ICD-10-CM | POA: Diagnosis not present

## 2017-08-22 DIAGNOSIS — E7800 Pure hypercholesterolemia, unspecified: Secondary | ICD-10-CM

## 2017-08-22 NOTE — Patient Instructions (Signed)
Medication Instructions:  NO CHANGES- Your physician recommends that you continue on your current medications as directed. Please refer to the Current Medication list given to you today.  If you need a refill on your cardiac medications before your next appointment, please call your pharmacy.  Labwork: BMET BEFORE YOUR FOLLOW UP IN 6 WEEKS HERE IN OUR OFFICE AT [facility]  Take the provided lab slips with you to the lab for your blood draw.   You will NOT need to fast   Special Instructions: MAY GET DONE WHEN YOU SEE NEPHROLOGIST   Follow-Up: Your physician wants you to follow-up in: [place] ([place]), DNP,AACC IF PRIMARY CARDIOLOGIST IS UNAVAILABLE.    Thank you for choosing [facility] at [facility]!!

## 2017-08-28 ENCOUNTER — Other Ambulatory Visit: Payer: Self-pay | Admitting: *Deleted

## 2017-08-28 NOTE — Patient Outreach (Signed)
[facility]) Care Management  08/28/2017   [name] Nov 11, 1933 [id number]  RN Health Coach received return telephone call from patient.  Hipaa compliance verified.Per patient he is doing good. Patient was not aware of the zones and action plan of CHF. Patient was initially diagnose this year with CHF. Patient is trying to decrease the sodium in his diet. Patient wife is his caregiver. Patient is able to drive and has transportation to Dr appointments. Patient does have a walker and a cane. Patient has agreed to further outreach calls.   Current Medications:  Current Outpatient Medications  Medication Sig Dispense Refill  . amLODipine (NORVASC) 10 MG tablet Take 10 mg by mouth at bedtime.    Marland Kitchen aspirin EC 81 MG tablet Take 81 mg by mouth daily.    Marland Kitchen atorvastatin (LIPITOR) 40 MG tablet Take 40 mg by mouth at bedtime.     . carboxymethylcellulose (REFRESH PLUS) 0.5 % SOLN Place 1 drop into both eyes 3 (three) times daily as needed (dry, itch).    . folic acid (FOLVITE) 811 MCG tablet Take 800 mcg by mouth daily.    . furosemide (LASIX) 40 MG tablet Take 40 mg by mouth 2 (two) times daily.    Marland Kitchen glimepiride (AMARYL) 4 MG tablet Take 4 mg by mouth daily with breakfast.     . hydrALAZINE (APRESOLINE) 100 MG tablet Take 100 mg by mouth 3 (three) times daily.    . Multiple Vitamin (MULTIVITAMIN) tablet Take 1 tablet by mouth daily.     . Multiple Vitamins-Minerals (MULTIVITAMIN ADULTS PO) Take 1 tablet by mouth every morning.    Marland Kitchen OVER THE COUNTER MEDICATION Place 1 spray into the nose daily as needed. Sovereign Silver Nasal Spray for Immune Support    . OVER THE COUNTER MEDICATION Take 1 capsule by mouth every morning.    . potassium chloride SA (K-DUR,KLOR-CON) 20 MEQ tablet Take 40 mEq by mouth 2 (two) times daily.    . TRADJENTA 5 MG TABS tablet Take 5 mg by mouth every morning.     . traZODone (DESYREL) 50 MG tablet Take 50 mg by mouth at bedtime. mg  12   No current  facility-administered medications for this visit.     Functional Status:  In your present state of health, do you have any difficulty performing the following activities: 08/28/2017 08/08/2017  Hearing? N N  Vision? N N  Comment - -  Difficulty concentrating or making decisions? N N  Walking or climbing stairs? Y Y  Comment - -  Dressing or bathing? N N  Doing errands, shopping? N N  Preparing Food and eating ? N -  Using the Toilet? N -  In the past six months, have you accidently leaked urine? N -  Do you have problems with loss of bowel control? N -  Managing your Medications? N -  Managing your Finances? N -  Housekeeping or managing your Housekeeping? Y -  Comment wife does -  Some recent data might be hidden    Fall/Depression Screening: Fall Risk  08/28/2017  Falls in the past year? No   PHQ 2/9 Scores 08/28/2017  PHQ - 2 Score 0   THN CM Care Plan Problem One     Most Recent Value  Care Plan Problem One  Knowledgr Deficit in Self Management of Congestive Heart Failure  Role Documenting the Problem One  [place] for Problem One  Active  [facility] Long  Term Goal   Patient will not have any admission for CHF wihtin the next 90 days  THN Long Term Goal Start Date  08/28/17  Interventions for Problem One Long Term Goal  RN discussed the zones and action plan of CHF. RN discussed medication adherence. RN discussed keeping appointments. RN will follow up with further discussion  THN CM Short Term Goal #1   Patient will be able to verbalized the zones of CHF within the next 30 days  THN CM Short Term Goal #1 Start Date  08/28/17  Interventions for Short Term Goal #1  RN discussed zones and action plan of CHF. RN sent Living Better with Heart Failure booklet. RN will follow up with further discussion and teach back  THN CM Short Term Goal #2   Patient will report weighing and documenting within the next 20 days  THN CM Short Term Goal #2 Start Date  08/28/17   Interventions for Short Term Goal #2  RN discussed the importance of daily weights and howw it affects the treatment. RN sent a 2019 Calendar book for documenting and keeping records. RN will follow up with further discussion      Assessment:  Patient is weighing self Patient is not aware of zones and action plan of CHF  Patient will benefit from Health Coach telephonic outreach for education and support for CHF self management.  Plan:  RN sent a 2019 Calendar RN sent Living well with Heart Failure RN discussed CHF zones and action plan RN sent barriers letter and assessment to PCP RN will follow up within the month of August  Jerilyn Gillaspie [facility] [phone]

## 2017-08-28 NOTE — Patient Outreach (Signed)
[facility]) Care Management  08/28/2017  [name] December 27, 1933 [id number]   RN Health Coach attempted follow up outreach call to patient.  Patient was unavailable. HIPPA compliance voicemail message left with return callback number.  Plan: RN will call patient again within 3-5 business  days.  [facility] [phone]

## 2017-08-29 DIAGNOSIS — I129 Hypertensive chronic kidney disease with stage 1 through stage 4 chronic kidney disease, or unspecified chronic kidney disease: Secondary | ICD-10-CM | POA: Diagnosis not present

## 2017-08-29 DIAGNOSIS — N2581 Secondary hyperparathyroidism of renal origin: Secondary | ICD-10-CM | POA: Diagnosis not present

## 2017-08-29 DIAGNOSIS — D631 Anemia in chronic kidney disease: Secondary | ICD-10-CM | POA: Diagnosis not present

## 2017-08-29 DIAGNOSIS — E1122 Type 2 diabetes mellitus with diabetic chronic kidney disease: Secondary | ICD-10-CM | POA: Diagnosis not present

## 2017-08-29 DIAGNOSIS — D649 Anemia, unspecified: Secondary | ICD-10-CM | POA: Diagnosis not present

## 2017-08-29 DIAGNOSIS — N184 Chronic kidney disease, stage 4 (severe): Secondary | ICD-10-CM | POA: Diagnosis not present

## 2017-09-01 ENCOUNTER — Telehealth: Payer: Self-pay | Admitting: Physician Assistant

## 2017-09-01 MED ORDER — FUROSEMIDE 40 MG PO TABS: 40.0000 mg | ORAL_TABLET | Freq: Two times a day (BID) | ORAL | Status: DC

## 2017-09-01 NOTE — Telephone Encounter (Signed)
[name] is a 84 y.o. male with diastolic congestive heart failure recently hospitalized for volume overload.  He called the answering service because his insurance is refusing to pay for his Lasix.  I have sent a new Rx to his pharmacy and spoken with the pharmacist.  Richardson Dopp, PA-C    09/01/2017 3:54 PM

## 2017-09-03 DIAGNOSIS — I509 Heart failure, unspecified: Secondary | ICD-10-CM | POA: Diagnosis not present

## 2017-09-03 DIAGNOSIS — Z79899 Other long term (current) drug therapy: Secondary | ICD-10-CM | POA: Diagnosis not present

## 2017-09-03 DIAGNOSIS — I10 Essential (primary) hypertension: Secondary | ICD-10-CM | POA: Diagnosis not present

## 2017-09-03 DIAGNOSIS — Z7189 Other specified counseling: Secondary | ICD-10-CM | POA: Diagnosis not present

## 2017-09-05 ENCOUNTER — Telehealth: Payer: Self-pay | Admitting: Adult Health

## 2017-09-05 ENCOUNTER — Ambulatory Visit: Payer: Self-pay | Admitting: *Deleted

## 2017-09-05 NOTE — Telephone Encounter (Signed)
Received records from [place] on 09/05/17, Appt 10/03/17 @ 2:30PM. [place]

## 2017-09-07 DIAGNOSIS — E113413 Type 2 diabetes mellitus with severe nonproliferative diabetic retinopathy with macular edema, bilateral: Secondary | ICD-10-CM | POA: Diagnosis not present

## 2017-09-18 ENCOUNTER — Other Ambulatory Visit: Payer: Self-pay

## 2017-09-18 NOTE — Patient Outreach (Signed)
[place] [facility]) Care Management  09/18/2017  TAHJAE CLAUSING 24-Aug-1933 [id number]   Medication Adherence call to Mr. Gregroy Dombkowski spoke with patient's wife she said patient is no longer taking Irbesartan 300 mg at this time doctor took him off.Mr. Encina is showing past due under [place].  [facility] Management Direct Dial [phone]  Fax [phone] [email or web address]

## 2017-10-01 ENCOUNTER — Other Ambulatory Visit: Payer: Self-pay | Admitting: *Deleted

## 2017-10-01 NOTE — Patient Outreach (Signed)
[facility]) Care Management  10/01/2017   [name] Jan 01, 1934 [id number]  Subjective: RN Health Coach telephone call to patient.  Hipaa compliance verified. Per patient he is monitoring his weight closely. Today weight was 160 pounds. Patient understands to call Dr if weight is greater than 3 pounds in 1 day or 5 pounds in a week. Patient stated he is a diabetic also. Patient blood sugar was 114 fasting today. Patient stated he also has stage 3 chronic kidney disease. Patient wanted to know if he could add a vegetable protein powder in his diet to help gain weight. RN noted in patient  Chart he has stage IV kidney disease. RN explained to patient that he need to discuss with his Dr because of the kidney disease he doesn't want to add the protein along with the other meats until he has confirmed with his physician how much protein they want him to have. RN discussed with patient to speak with Dr about seeing a nutritionist to make sure he is getting the proper diet and proper protein he is allowed. Patient has agreed to further outreach follow up calls.  Current Medications:  Current Outpatient Medications  Medication Sig Dispense Refill  . amLODipine (NORVASC) 10 MG tablet Take 10 mg by mouth at bedtime.    Marland Kitchen aspirin EC 81 MG tablet Take 81 mg by mouth daily.    Marland Kitchen atorvastatin (LIPITOR) 40 MG tablet Take 40 mg by mouth at bedtime.     . carboxymethylcellulose (REFRESH PLUS) 0.5 % SOLN Place 1 drop into both eyes 3 (three) times daily as needed (dry, itch).    . folic acid (FOLVITE) 347 MCG tablet Take 800 mcg by mouth daily.    . furosemide (LASIX) 40 MG tablet Take 1 tablet (40 mg total) by mouth 2 (two) times daily. 30 tablet   . glimepiride (AMARYL) 4 MG tablet Take 4 mg by mouth daily with breakfast.     . hydrALAZINE (APRESOLINE) 100 MG tablet Take 100 mg by mouth 3 (three) times daily.    . Multiple Vitamin (MULTIVITAMIN) tablet Take 1 tablet by mouth daily.     .  Multiple Vitamins-Minerals (MULTIVITAMIN ADULTS PO) Take 1 tablet by mouth every morning.    Marland Kitchen OVER THE COUNTER MEDICATION Place 1 spray into the nose daily as needed. Sovereign Silver Nasal Spray for Immune Support    . OVER THE COUNTER MEDICATION Take 1 capsule by mouth every morning.    . potassium chloride SA (K-DUR,KLOR-CON) 20 MEQ tablet Take 40 mEq by mouth 2 (two) times daily.    . TRADJENTA 5 MG TABS tablet Take 5 mg by mouth every morning.     . traZODone (DESYREL) 50 MG tablet Take 50 mg by mouth at bedtime. mg  12   No current facility-administered medications for this visit.     Functional Status:  In your present state of health, do you have any difficulty performing the following activities: 08/28/2017 08/08/2017  Hearing? N N  Vision? N N  Comment - -  Difficulty concentrating or making decisions? N N  Walking or climbing stairs? Y Y  Comment - -  Dressing or bathing? N N  Doing errands, shopping? N N  Preparing Food and eating ? N -  Using the Toilet? N -  In the past six months, have you accidently leaked urine? N -  Do you have problems with loss of bowel control? N -  Managing your Medications? N -  Managing your Finances? N -  Housekeeping or managing your Housekeeping? Y -  Comment wife does -  Some recent data might be hidden    Fall/Depression Screening: Fall Risk  10/01/2017 08/28/2017  Falls in the past year? No No   PHQ 2/9 Scores 10/01/2017 08/28/2017  PHQ - 2 Score 0 0   THN CM Care Plan Problem One     Most Recent Value  Care Plan Problem One  Knowledge Deficit in Self Management of Congestive Heart Failure  Role Documenting the Problem One  [place] for Problem One  Active  THN Long Term Goal   Patient will not have any admission for CHF wihtin the next 90 days  Interventions for Problem One Long Term Goal  RN reiterated zones and action plan. RN reiterated daily weight. RN discussed medication adherence. RN will follow up with further  discussion  THN CM Short Term Goal #1   Patient will be able to verbalized the zones of CHF within the next 30 days  THN CM Short Term Goal #1 Met Date  10/01/17  [facility] CM Short Term Goal #2   Patient will report weighing and documenting within the next 20 days  THN CM Short Term Goal #2 Met Date  10/01/17  [facility] CM Short Term Goal #3  Patient will verbalize eating low sodium heart healthy diet within the next 30 days  THN CM Short Term Goal #3 Start Date  10/01/17  Interventions for Short Tern Goal #3  RN discussed eat low salt diet. RN discussed also talking with physician about diet since CKD stage IV and diabetic. RN sent facial sheet on low salt foods. RN send Neurosurgeon on [employer]. RN will follow up with further discussion  THN CM Short Term Goal #4  Patient will have a better undersatnding of A1C and fasting blood sugar within the next 30 days  THN CM Short Term Goal #4 Start Date  10/01/17  Interventions for Short Term Goal #4  RN discussed what the patient fasting blood sugar is how3 it relates to the A1C. RN sent living welll with diabetes booklet. RN will follow up with further discussion and teach back     Assessment:  Patient weighs daily Pt weight 160 lbs Fasting blood  sugar is 115 Patient interacts well with discussion and teach back Patient will continue to  benefit from [place] telephonic outreach for education and support for CHF and diabetes self management.  Plan:  RN discussed CHF zones and action plan RN discussed FBS and A1C RN discussed patient diet  Needs low in Na, protein for Stage IV CKD and diabetes RN sent living well with Diabetes booklet RN sent facial picture sheet of  Foods high and low in sodium RN sent educational material on low sodium diet RN will follow up outreach within the month of September  [facility] Management [phone]

## 2017-10-03 ENCOUNTER — Ambulatory Visit: Payer: Medicare Other | Admitting: Adult Health

## 2017-10-03 ENCOUNTER — Encounter: Payer: Self-pay | Admitting: Adult Health

## 2017-10-03 VITALS — BP 150/70 | HR 61 | Ht 71.5 in | Wt 168.8 lb

## 2017-10-03 DIAGNOSIS — N289 Disorder of kidney and ureter, unspecified: Secondary | ICD-10-CM

## 2017-10-03 DIAGNOSIS — I5032 Chronic diastolic (congestive) heart failure: Secondary | ICD-10-CM

## 2017-10-03 DIAGNOSIS — I10 Essential (primary) hypertension: Secondary | ICD-10-CM | POA: Diagnosis not present

## 2017-10-03 DIAGNOSIS — Z79899 Other long term (current) drug therapy: Secondary | ICD-10-CM

## 2017-10-03 DIAGNOSIS — I482 Chronic atrial fibrillation: Secondary | ICD-10-CM | POA: Diagnosis not present

## 2017-10-03 DIAGNOSIS — I2510 Atherosclerotic heart disease of native coronary artery without angina pectoris: Secondary | ICD-10-CM | POA: Diagnosis not present

## 2017-10-03 NOTE — Progress Notes (Signed)
Cardiology Office Note   Date:  10/03/2017   ID:  [name], DOB [birth year], MRN [id number]  PCP:  Jani Gravel, MD  Cardiologist: Dr. Debara Pickett  Chief Complaint  Patient presents with  . Follow-up    Afib     History of Present Illness: [name] is a 84 y.o. male who presents for ongoing assessment and management of CAD s/p CABG X 3, with cath in 05/2017 revealing patent grafts, chronic diastolic CHF, with recent hospitalization for volume overload, anasarca, and hypertension. Other history includes Type II diabetes, CKD stage IV.   Was last seen in the office for post hospital follow up and was doing well without evidence of fluid overload. He is here for monthly follow up for close evaluation of BP control and need to increase his hydralazine if necessary.   He is doing well. Weighing daily, avoiding salt, and eating nutritional supplements. He states he feels much better and has more energy. He is worried about his kidney status and eating extra protein. He is due to see nephrology on 10/15/2017.   Past Medical History:  Diagnosis Date  . Arthritis    "a little in LLE" (06/20/2017)  . Chronic diastolic CHF (congestive heart failure) ([facility])   . CKD (chronic kidney disease), stage IV ([place])   . Coronary artery disease    a. s/p CABG 1980s. b. stable by cath 05/2017.  Marland Kitchen Dyslipidemia   . Edema 08/08/2017   HANDS & LOWER EXTREMITES  . Hypertension   . Hypoalbuminemia   . Nephrotic range proteinuria   . Peripheral artery disease ([facility])    mild  . Pneumonia ~ 1950 X 1  . Prostate cancer ([place]) ~ 2015  . Type II diabetes mellitus ([place])     Past Surgical History:  Procedure Laterality Date  . CARDIAC CATHETERIZATION     "a couple times" (06/20/2017)  . CATARACT EXTRACTION W/ INTRAOCULAR LENS  IMPLANT, BILATERAL Bilateral   . CORONARY ARTERY BYPASS GRAFT  03/06/1985   LAD and first diagonal/circumflex (sequential saphenous vein graft,cardioplegia  . INSERTION PROSTATE RADIATION  SEED  ~ 2015  . NM MYOCAR PERF WALL MOTION  09/12/2006   no ischemia  . RIGHT/LEFT HEART CATH AND CORONARY/GRAFT ANGIOGRAPHY N/A 06/21/2017   Procedure: RIGHT/LEFT HEART CATH AND CORONARY/GRAFT ANGIOGRAPHY;  Surgeon: Burnell Blanks, MD;  Location: [facility];  Service: Cardiovascular;  Laterality: N/A;  . US ECHOCARDIOGRAPHY  11/04/2008   trace TR & MR     Current Outpatient Medications  Medication Sig Dispense Refill  . amLODipine (NORVASC) 10 MG tablet Take 10 mg by mouth at bedtime.    Marland Kitchen aspirin EC 81 MG tablet Take 81 mg by mouth daily.    Marland Kitchen atorvastatin (LIPITOR) 40 MG tablet Take 40 mg by mouth at bedtime.     . carboxymethylcellulose (REFRESH PLUS) 0.5 % SOLN Place 1 drop into both eyes 3 (three) times daily as needed (dry, itch).    . folic acid (FOLVITE) 409 MCG tablet Take 800 mcg by mouth daily.    . furosemide (LASIX) 40 MG tablet Take 1 tablet (40 mg total) by mouth 2 (two) times daily. 30 tablet   . glimepiride (AMARYL) 4 MG tablet Take 4 mg by mouth daily with breakfast.     . hydrALAZINE (APRESOLINE) 100 MG tablet Take 100 mg by mouth 3 (three) times daily.    . Multiple Vitamin (MULTIVITAMIN) tablet Take 1 tablet by mouth daily.     Marland Kitchen  Multiple Vitamins-Minerals (MULTIVITAMIN ADULTS PO) Take 1 tablet by mouth every morning.    Marland Kitchen OVER THE COUNTER MEDICATION Place 1 spray into the nose daily as needed. Sovereign Silver Nasal Spray for Immune Support    . OVER THE COUNTER MEDICATION Take 1 capsule by mouth every morning.    . potassium chloride SA (K-DUR,KLOR-CON) 20 MEQ tablet Take 40 mEq by mouth 2 (two) times daily.    . TRADJENTA 5 MG TABS tablet Take 5 mg by mouth every morning.     . traZODone (DESYREL) 50 MG tablet Take 50 mg by mouth at bedtime. mg  12   No current facility-administered medications for this visit.     Allergies:   Patient has no known allergies.    Social History:  The patient  reports that he quit smoking about 45 years ago. His  smoking use included cigarettes. He has a 20.00 pack-year smoking history. He has never used smokeless tobacco. He reports that he drank alcohol. He reports that he does not use drugs.   Family History:  The patient's family history includes Heart attack in his mother.    ROS: All other systems are reviewed and negative. Unless otherwise mentioned in H&P    PHYSICAL EXAM: VS:  BP (!) 150/70   Pulse 61   Ht 5' 11.5" (1.816 m)   Wt 168 lb 12.8 oz (76.6 kg)   BMI 23.21 kg/m  , BMI Body mass index is 23.21 kg/m. GEN: Well nourished, well developed, in no acute distress  HEENT: normal  Neck: no JVD, carotid bruits, or masses Cardiac: IRRR; no murmurs, rubs, or gallops,no edema  Respiratory:  clear to auscultation bilaterally, normal work of breathing GI: soft, nontender, nondistended, + BS MS: no deformity or atrophy  Skin: warm and dry, no rash Neuro:  Strength and sensation are intact Psych: euthymic mood, full affect   EKG:  Atrial fib HR of 87 bpm.   Recent Labs: 02-Mar-2017: NT-Pro BNP 1,277 08/08/2017: B Natriuretic Peptide 653.2; Hemoglobin 11.4; Platelets 244; TSH 4.171 08/12/2017: Magnesium 1.8 08/15/2017: BUN 21; Creatinine, Ser 2.93; Potassium 4.5; Sodium 142    Lipid Panel No results found for: CHOL, TRIG, HDL, CHOLHDL, VLDL, LDLCALC, LDLDIRECT    Wt Readings from Last 3 Encounters:  10/03/17 168 lb 12.8 oz (76.6 kg)  08/22/17 172 lb (78 kg)  08/15/17 170 lb 11.2 oz (77.4 kg)      Other studies Reviewed: Echocardiogram 02-Mar-2017 Left ventricle: The cavity size was normal. There was mild concentric hypertrophy. Systolic function was vigorous. The estimated ejection fraction was in the range of 65% to 70%. Wall motion was normal; there were no regional wall motion abnormalities. Features are consistent with a pseudonormal left ventricular filling pattern, with concomitant abnormal relaxation and increased filling pressure (grade 2 diastolic  dysfunction). Doppler parameters are consistent with high ventricular filling pressure. - Aortic valve: There was mild stenosis. There was no regurgitation. Peak velocity (S): 269 cm/s. Mean gradient (S): 16 mm Hg. - Mitral valve: Transvalvular velocity was within the normal range. There was no evidence for stenosis. There was mild regurgitation. - Left atrium: The atrium was moderately dilated. - Right ventricle: The cavity size was normal. Wall thickness was normal. Systolic function was normal. - Atrial septum: No defect or patent foramen ovale was identified. - Tricuspid valve: There was mild regurgitation. Peak RV-RA gradient (S): 47 mm Hg. - Pulmonary arteries: Systolic pressure was moderately increased. PA peak pressure: 50 mm Hg (S). -  Pericardium, extracardiac: A small pericardial effusion was identified.    ASSESSMENT AND PLAN:  1. CAD: No complaints of chest pain. He will continue on secondary prevention . Not currently on statin therapy. Will need follow up labs on next appointment.   2. CKD Stage IV: Last creatinine 3.1. Will repeat his lab today for ongoing evaluation. He is to see nephrology as directed. He is advised not to overdue protein supplements.   3. Diabetes: Complains of night sweats. He will need to see PCP for management of his medications. He has lost over 30 lbs with diureses and with low salt diet.   4. Chronic Diastolic CHF: He is weighing daily. He has lost more weight. Checking his BMET today to evaluate need to decrease lasix. Normal EF.   Current medicines are reviewed at length with the patient today.    Labs/ tests ordered today include: None  Phill Myron. West Pugh, ANP, AACC   10/03/2017 2:44 PM    [facility] [address] [place], [place] [address] Phone: [phone]; Fax: [phone]

## 2017-10-03 NOTE — Patient Instructions (Addendum)
.Medication Instructions:  NO CHANGES- Your physician recommends that you continue on your current medications as directed. Please refer to the Current Medication list given to you today.  If you need a refill on your cardiac medications before your next appointment, please call your pharmacy.  Labwork: BMET TODAY HERE IN OUR OFFICE AT [facility]  Take the provided lab slips with you to the lab for your blood draw.   Follow-Up: Your physician wants you to follow-up in: 3 MONTHS WITH DR Martinique.    Thank you for choosing [facility] at [facility]!!      Food Basics for Chronic Kidney Disease When your kidneys are not working well, they cannot remove waste and excess substances from your blood as effectively as they did before. This can lead to a buildup and imbalance of these substances, which can worsen kidney damage and affect how your body functions. Certain foods lead to a buildup of these substances in the body. By changing your diet as recommended by your diet and nutrition specialist (dietitian) or health care provider, you could help prevent further kidney damage and delay or prevent the need for dialysis. What are tips for following this plan? General instructions  Work with your health care provider and dietitian to develop a meal plan that is right for you. Foods you can eat, limit, or avoid will be different for each person depending on the stage of kidney disease and any other existing health conditions.  Talk with your health care provider about whether you should take a vitamin and mineral supplement.  Use standard measuring cups and spoons to measure servings of foods. Use a kitchen scale to measure portions of protein foods.  If directed by your health care provider, avoid drinking too much fluid. Measure and count all liquids, including water, ice, soups, flavored gelatin, and frozen desserts such as popsicles or ice cream. Reading food labels  Check the amount of  sodium in foods. Choose foods that have less than 300 milligrams (mg) per serving.  Check the ingredient list for phosphorus or potassium-based additives or preservatives.  Check the amount of saturated and trans fat. Limit or avoid these fats as told by your dietitian. Shopping  Avoid buying foods that are: ? Processed, frozen, or prepackaged. ? Calcium-enriched or fortified.  Do not buy foods that have salt or sodium listed among the first five ingredients.  Do not buy canned vegetables. Cooking  Replace animal proteins, such as meat, fish, eggs, or dairy, with plant proteins from beans, nuts, and soy. ? Use soy milk instead of cow's milk. ? Add beans or tofu to soups, casseroles, or pasta dishes instead of meat.  Soak vegetables, such as potatoes, before cooking to reduce potassium. To do this: ? Peel and cut into small pieces. ? Soak in warm water for at least 2 hours. For every 1 cup of vegetables, use 10 cups of water. ? Drain and rinse with warm water. ? Boil for at least 5 minutes. Meal planning  Limit the amount of protein from plant and animal sources you eat each day.  Do not add salt to food when cooking or before eating.  Eat meals and snacks at around the same time each day. If you have diabetes:  If you have diabetes (diabetes mellitus) and chronic kidney disease, it is important to keep your blood glucose in the target range recommended by your health care provider. Follow your diabetes management plan. This may include: ? Checking your blood glucose  regularly. ? Taking oral medicines, insulin, or both. ? Exercising for at least 30 minutes on 5 or more days each week, or as told by your health care provider. ? Tracking how many servings of carbohydrates you eat at each meal.  You may be given specific guidelines on how much of certain foods and nutrients you may eat, depending on your stage of kidney disease and whether you have high blood pressure  (hypertension). Follow your meal plan as told by your dietitian. What nutrients should be limited? The items listed are not a complete list. Talk with your dietitian about what dietary choices are best for you. Potassium Potassium affects how steadily your heart beats. If too much potassium builds up in your blood, it can cause an irregular heartbeat or even a heart attack. You may need to eat less potassium, depending on your blood potassium levels and the stage of kidney disease. Talk to your dietitian about how much potassium you may have each day. You may need to limit or avoid foods that are high in potassium, such as:  Milk and soy milk.  Fruits, such as bananas, papaya, apricots, nectarines, melon, prunes, raisins, kiwi, and oranges.  Vegetables, such as potatoes, sweet potatoes, yams, tomatoes, leafy greens, beets, okra, avocado, pumpkin, and winter squash.  White and lima beans.  Phosphorus Phosphorus is a mineral found in your bones. A balance between calcium and phosphorous is needed to build and maintain healthy bones. Too much phosphorus pulls calcium from your bones. This can make your bones weak and more likely to break. Too much phosphorus can also make your skin itch. You may need to eat less phosphorus depending on your blood phosphorus levels and the stage of kidney disease. Talk to your dietitian about how much potassium you may have each day. You may need to take medicine to lower your blood phosphorus levels if diet changes do not help. You may need to limit or avoid foods that are high in phosphorus, such as:  Milk and dairy products.  Dried beans and peas.  Tofu, soy milk, and other soy-based meat replacements.  Colas.  Nuts and peanut butter.  Meat, poultry, and fish.  Bran cereals and oatmeals.  Protein Protein helps you to make and keep muscle. It also helps in the repair of your body's cells and tissues. One of the natural breakdown products of protein  is a waste product called urea. When your kidneys are not working properly, they cannot remove wastes, such as urea, like they did before you developed chronic kidney disease. Reducing how much protein you eat can help prevent a buildup of urea in your blood. Depending on your stage of kidney disease, you may need to limit foods that are high in protein. Sources of animal protein include:  Meat (all types).  Fish and seafood.  Poultry.  Eggs.  Dairy.  Other protein foods include:  Beans and legumes.  Nuts and nut butter.  Soy and tofu.  Sodium Sodium, which is found in salt, helps maintain a healthy balance of fluids in your body. Too much sodium can increase your blood pressure and have a negative effect on the function of your heart and lungs. Too much sodium can also cause your body to retain too much fluid, making your kidneys work harder. Most people should have less than 2,300 milligrams (mg) of sodium each day. If you have hypertension, you may need to limit your sodium to 1,500 mg each day. Talk to your  dietitian about how much sodium you may have each day. You may need to limit or avoid foods that are high in sodium, such as:  Salt seasonings.  Soy sauce.  Cured and processed meats.  Salted crackers and snack foods.  Fast food.  Canned soups and most canned foods.  Pickled foods.  Vegetable juice.  Boxed mixes or ready-to-eat boxed meals and side dishes.  Bottled dressings, sauces, and marinades.  Summary  Chronic kidney disease can lead to a buildup and imbalance of waste and excess substances in the body. Certain foods lead to a buildup of these substances. By adjusting your intake of these foods, you could help prevent more kidney damage and delay or prevent the need for dialysis.  Food adjustments are different for each person with chronic kidney disease. Work with a dietitian to set up nutrient goals and a meal plan that is right for you.  If you have  diabetes and chronic kidney disease, it is important to keep your blood glucose in the target range recommended by your health care provider. This information is not intended to replace advice given to you by your health care provider. Make sure you discuss any questions you have with your health care provider. Document Released: 05/06/2002 Document Revised: 02/09/2016 Document Reviewed: 02/09/2016 Elsevier Interactive Patient Education  [employer].

## 2017-10-04 LAB — BASIC METABOLIC PANEL
BUN/Creatinine Ratio: 13 (ref 10–24)
BUN: 40 mg/dL — ABNORMAL HIGH (ref 8–27)
CO2: 18 mmol/L — ABNORMAL LOW (ref 20–29)
Calcium: 9.2 mg/dL (ref 8.6–10.2)
Chloride: 110 mmol/L — ABNORMAL HIGH (ref 96–106)
Creatinine, Ser: 3.17 mg/dL — ABNORMAL HIGH (ref 0.76–1.27)
GFR calc Af Amer: 20 mL/min/{1.73_m2} — ABNORMAL LOW (ref >59)
GFR calc non Af Amer: 17 mL/min/{1.73_m2} — ABNORMAL LOW (ref >59)
Glucose: 170 mg/dL — ABNORMAL HIGH (ref 65–99)
Potassium: 5.5 mmol/L — ABNORMAL HIGH (ref 3.5–5.2)
Sodium: 142 mmol/L (ref 134–144)

## 2017-10-09 ENCOUNTER — Telehealth: Payer: Self-pay | Admitting: Adult Health

## 2017-10-09 NOTE — Telephone Encounter (Signed)
Pt aware to hold Potassium at this time ./cy

## 2017-10-09 NOTE — Addendum Note (Signed)
Addended by: Devra Dopp E on: 10/09/2017 03:44 PM   Modules accepted: Orders

## 2017-10-09 NOTE — Telephone Encounter (Signed)
Ok, then stop the potassium please.

## 2017-10-09 NOTE — Telephone Encounter (Signed)
Last k was 5.5 and lasix was decreased./cy

## 2017-10-09 NOTE — Telephone Encounter (Signed)
Per pt Lasix was recently reduced to 40 mg daily with repeat bmet Thursday or Friday Pt has ran out of Potassium is wanting to know if he needs to continue. Will forward to Alger Simons NP for review .Adonis Housekeeper

## 2017-10-09 NOTE — Telephone Encounter (Signed)
New Message:    Pt said his fluid pill was reduced to 1 tablet a day.Pt question is does he still need to take his Potassium?

## 2017-10-09 NOTE — Telephone Encounter (Signed)
Please continue potassium. New Rx can be sent.

## 2017-10-12 DIAGNOSIS — R809 Proteinuria, unspecified: Secondary | ICD-10-CM | POA: Diagnosis not present

## 2017-10-12 DIAGNOSIS — Z79899 Other long term (current) drug therapy: Secondary | ICD-10-CM | POA: Diagnosis not present

## 2017-10-13 LAB — BASIC METABOLIC PANEL
BUN/Creatinine Ratio: 15 (ref 10–24)
BUN: 38 mg/dL — ABNORMAL HIGH (ref 8–27)
CO2: 23 mmol/L (ref 20–29)
Calcium: 9.3 mg/dL (ref 8.6–10.2)
Chloride: 107 mmol/L — ABNORMAL HIGH (ref 96–106)
Creatinine, Ser: 2.51 mg/dL — ABNORMAL HIGH (ref 0.76–1.27)
GFR calc Af Amer: 27 mL/min/{1.73_m2} — ABNORMAL LOW (ref >59)
GFR calc non Af Amer: 23 mL/min/{1.73_m2} — ABNORMAL LOW (ref >59)
Glucose: 205 mg/dL — ABNORMAL HIGH (ref 65–99)
Potassium: 4.4 mmol/L (ref 3.5–5.2)
Sodium: 143 mmol/L (ref 134–144)

## 2017-10-15 DIAGNOSIS — E1122 Type 2 diabetes mellitus with diabetic chronic kidney disease: Secondary | ICD-10-CM | POA: Diagnosis not present

## 2017-10-15 DIAGNOSIS — E119 Type 2 diabetes mellitus without complications: Secondary | ICD-10-CM | POA: Diagnosis not present

## 2017-10-15 DIAGNOSIS — I2510 Atherosclerotic heart disease of native coronary artery without angina pectoris: Secondary | ICD-10-CM | POA: Diagnosis not present

## 2017-10-15 DIAGNOSIS — N183 Chronic kidney disease, stage 3 (moderate): Secondary | ICD-10-CM | POA: Diagnosis not present

## 2017-10-30 DIAGNOSIS — I129 Hypertensive chronic kidney disease with stage 1 through stage 4 chronic kidney disease, or unspecified chronic kidney disease: Secondary | ICD-10-CM | POA: Diagnosis not present

## 2017-10-30 DIAGNOSIS — E1122 Type 2 diabetes mellitus with diabetic chronic kidney disease: Secondary | ICD-10-CM | POA: Diagnosis not present

## 2017-10-30 DIAGNOSIS — D631 Anemia in chronic kidney disease: Secondary | ICD-10-CM | POA: Diagnosis not present

## 2017-10-30 DIAGNOSIS — N2581 Secondary hyperparathyroidism of renal origin: Secondary | ICD-10-CM | POA: Diagnosis not present

## 2017-10-30 DIAGNOSIS — N184 Chronic kidney disease, stage 4 (severe): Secondary | ICD-10-CM | POA: Diagnosis not present

## 2017-11-01 ENCOUNTER — Other Ambulatory Visit: Payer: Self-pay | Admitting: *Deleted

## 2017-11-01 NOTE — Patient Outreach (Signed)
[facility]) Care Management  11/01/2017   [name] 04-Jan-1934 [id number]  RN Health Coach telephone call to patient.  [employer] compliance verified. Patient is weighing daily and documenting. RN discussed edema in feet, legs, hands and stomach to be aware of. Patient stated he did have a little swelling in his right hand but when he took his medication it went down. RN discussed the patient typical diet that he may have. RN explained certain foods the patient was not aware of had sodium in it. RN discussed the different types of ensure and which one is better for him to drink with diabetes ([place]). Patient stated his blood pressure is a little up 166/70.  RN discussed monitoring his salt intake and medication adherence. Patient has agreed to follow up outreach calls.   Current Medications:  Current Outpatient Medications  Medication Sig Dispense Refill  . amLODipine (NORVASC) 10 MG tablet Take 10 mg by mouth at bedtime.    Marland Kitchen aspirin EC 81 MG tablet Take 81 mg by mouth daily.    Marland Kitchen atorvastatin (LIPITOR) 40 MG tablet Take 40 mg by mouth at bedtime.     . carboxymethylcellulose (REFRESH PLUS) 0.5 % SOLN Place 1 drop into both eyes 3 (three) times daily as needed (dry, itch).    . folic acid (FOLVITE) 998 MCG tablet Take 800 mcg by mouth daily.    . furosemide (LASIX) 40 MG tablet Take 1 tablet (40 mg total) by mouth 2 (two) times daily. 30 tablet   . glimepiride (AMARYL) 4 MG tablet Take 4 mg by mouth daily with breakfast.     . hydrALAZINE (APRESOLINE) 100 MG tablet Take 100 mg by mouth 3 (three) times daily.    . Multiple Vitamin (MULTIVITAMIN) tablet Take 1 tablet by mouth daily.     . Multiple Vitamins-Minerals (MULTIVITAMIN ADULTS PO) Take 1 tablet by mouth every morning.    Marland Kitchen OVER THE COUNTER MEDICATION Place 1 spray into the nose daily as needed. Sovereign Silver Nasal Spray for Immune Support    . OVER THE COUNTER MEDICATION Take 1 capsule by mouth every morning.     . TRADJENTA 5 MG TABS tablet Take 5 mg by mouth every morning.     . traZODone (DESYREL) 50 MG tablet Take 50 mg by mouth at bedtime. mg  12   No current facility-administered medications for this visit.     Functional Status:  In your present state of health, do you have any difficulty performing the following activities: 08/28/2017 08/08/2017  Hearing? N N  Vision? N N  Comment - -  Difficulty concentrating or making decisions? N N  Walking or climbing stairs? Y Y  Comment - -  Dressing or bathing? N N  Doing errands, shopping? N N  Preparing Food and eating ? N -  Using the Toilet? N -  In the past six months, have you accidently leaked urine? N -  Do you have problems with loss of bowel control? N -  Managing your Medications? N -  Managing your Finances? N -  Housekeeping or managing your Housekeeping? Y -  Comment wife does -  Some recent data might be hidden    Fall/Depression Screening: Fall Risk  10/01/2017 08/28/2017  Falls in the past year? No No   PHQ 2/9 Scores 10/01/2017 08/28/2017  PHQ - 2 Score 0 0   THN CM Care Plan Problem One     Most Recent Value  Care Plan Problem  One  Knowledge Deficit in Self Management of Congestive Heart Failure  Role Documenting the Problem One  [place] for Problem One  Active  THN Long Term Goal   Patient will not have any admission for CHF within the next 90 days  Interventions for Problem One Long Term Goal  Rn reiterates zones, medication adherence, dietary changes and daily weights  THN CM Short Term Goal #2   Patient will verbalize receiving information on Hypertension within the next 30 days  THN CM Short Term Goal #2 Start Date  11/01/17  Interventions for Short Term Goal #2  RN discussed eating healthy and taking medications to decrease the risk of hypertension. RN sent patient educational material A matter of choices blood pressure control. RN will follow up for further discussion  THN CM Short Term Goal #3   Patient will verbalize eating low sodium heart healthy diet within the next 30 days  THN CM Short Term Goal #3 Start Date  11/01/17  Interventions for Short Tern Goal #3  RN reiterated foods that are high in sodium and ways to prepare food to decrease sodium. RN will follow up with further discussions  THN CM Short Term Goal #4  Patient will have a better undersatnding of A1C and fasting blood sugar within the next 30 days  THN CM Short Term Goal #4 Met Date  11/01/17  [facility] CM Short Term Goal #5   Patient will have a better understanding of healthy snacks within the next 30 days  THN CM Short Term Goal #5 Start Date  11/01/17  Interventions for Short Term Goal #5  RN discussed healthy snacks that are low carb and healthy snacks that promote the health of kidneys. RN will follow up with further discussion.      Assessment:  Patient is weighing daily BP is 166/70 Patient is trying to eat more healthy Patient will continue to benefit from Health Coach telephonic outreach for education and support for CHF self management.  Plan:  RN discussed eating healthy RN discussed low carb snacks RN sent educational material on Low carb snacks RN sent Hypertension booklet RN reiterated  low sodium foods RN will follow up outreach within the month of November  Mimi Debellis [place] [facility] [phone]

## 2017-11-02 ENCOUNTER — Telehealth: Payer: Self-pay | Admitting: Cardiology

## 2017-11-02 NOTE — Telephone Encounter (Signed)
Received records from [place] on 11/11/17, Appt on 9:40AM.[place]

## 2017-11-06 DIAGNOSIS — E113413 Type 2 diabetes mellitus with severe nonproliferative diabetic retinopathy with macular edema, bilateral: Secondary | ICD-10-CM | POA: Diagnosis not present

## 2017-11-07 DIAGNOSIS — I129 Hypertensive chronic kidney disease with stage 1 through stage 4 chronic kidney disease, or unspecified chronic kidney disease: Secondary | ICD-10-CM | POA: Diagnosis not present

## 2017-11-07 DIAGNOSIS — N184 Chronic kidney disease, stage 4 (severe): Secondary | ICD-10-CM | POA: Diagnosis not present

## 2017-12-03 DIAGNOSIS — N183 Chronic kidney disease, stage 3 (moderate): Secondary | ICD-10-CM | POA: Diagnosis not present

## 2017-12-03 DIAGNOSIS — I2510 Atherosclerotic heart disease of native coronary artery without angina pectoris: Secondary | ICD-10-CM | POA: Diagnosis not present

## 2017-12-03 DIAGNOSIS — E1122 Type 2 diabetes mellitus with diabetic chronic kidney disease: Secondary | ICD-10-CM | POA: Diagnosis not present

## 2017-12-03 DIAGNOSIS — R609 Edema, unspecified: Secondary | ICD-10-CM | POA: Diagnosis not present

## 2017-12-03 DIAGNOSIS — E789 Disorder of lipoprotein metabolism, unspecified: Secondary | ICD-10-CM | POA: Diagnosis not present

## 2017-12-03 DIAGNOSIS — R21 Rash and other nonspecific skin eruption: Secondary | ICD-10-CM | POA: Diagnosis not present

## 2017-12-12 DIAGNOSIS — N183 Chronic kidney disease, stage 3 (moderate): Secondary | ICD-10-CM | POA: Diagnosis not present

## 2017-12-12 DIAGNOSIS — I2510 Atherosclerotic heart disease of native coronary artery without angina pectoris: Secondary | ICD-10-CM | POA: Diagnosis not present

## 2017-12-12 DIAGNOSIS — E119 Type 2 diabetes mellitus without complications: Secondary | ICD-10-CM | POA: Diagnosis not present

## 2017-12-12 DIAGNOSIS — E1122 Type 2 diabetes mellitus with diabetic chronic kidney disease: Secondary | ICD-10-CM | POA: Diagnosis not present

## 2017-12-13 DIAGNOSIS — N184 Chronic kidney disease, stage 4 (severe): Secondary | ICD-10-CM | POA: Diagnosis not present

## 2017-12-13 DIAGNOSIS — M059 Rheumatoid arthritis with rheumatoid factor, unspecified: Secondary | ICD-10-CM | POA: Diagnosis not present

## 2017-12-13 DIAGNOSIS — M1712 Unilateral primary osteoarthritis, left knee: Secondary | ICD-10-CM | POA: Diagnosis not present

## 2017-12-13 DIAGNOSIS — M65332 Trigger finger, left middle finger: Secondary | ICD-10-CM | POA: Diagnosis not present

## 2017-12-14 ENCOUNTER — Encounter: Payer: Self-pay | Admitting: Cardiology

## 2018-01-01 ENCOUNTER — Ambulatory Visit: Payer: Self-pay | Admitting: *Deleted

## 2018-01-02 ENCOUNTER — Encounter: Payer: Self-pay | Admitting: Internal Medicine

## 2018-01-02 ENCOUNTER — Ambulatory Visit: Payer: Medicare Other | Admitting: Internal Medicine

## 2018-01-02 VITALS — BP 158/70 | HR 65 | Ht 70.5 in | Wt 170.0 lb

## 2018-01-02 DIAGNOSIS — N184 Chronic kidney disease, stage 4 (severe): Secondary | ICD-10-CM

## 2018-01-02 DIAGNOSIS — I10 Essential (primary) hypertension: Secondary | ICD-10-CM | POA: Diagnosis not present

## 2018-01-02 DIAGNOSIS — I2510 Atherosclerotic heart disease of native coronary artery without angina pectoris: Secondary | ICD-10-CM

## 2018-01-02 DIAGNOSIS — I5032 Chronic diastolic (congestive) heart failure: Secondary | ICD-10-CM | POA: Diagnosis not present

## 2018-01-02 MED ORDER — CARVEDILOL 3.125 MG PO TABS: 3.1250 mg | ORAL_TABLET | Freq: Two times a day (BID) | ORAL | 3 refills | Status: DC

## 2018-01-02 NOTE — Progress Notes (Signed)
OFFICE NOTE   Chief Complaint:  Follow-up CHF  Primary Care Physician: Jani Gravel, MD  HPI:  [name] is a 84 year old with a history of coronary artery disease status post CABG in 1987, hypertension, dyslipidemia, diabetes, and peripheral arterial disease which is mild. Over the past year he has done well from a cardiac standpoint. He does have a history of prostate cancer and underwent radiation seed implant for that. He has also been having problems with confusion and brain fog and his medications were adjusted, including holding his statin, which did not cause much improvement. This may be some early dementia. In addition, he has abnormal lower extremity Dopplers, which I repeated. This has actually shown mild improvement in his ABIs with ABI 1.1 on the right and ABI of 0.92 on the left. The bilateral posterior tibials were occluded and there was 50% to 69% reduction in left common iliac and about a 0 to 49% reduction in the right superficial femoral. Despite this, he has no symptoms of claudication. He also denies any chest pain. He has had some shortness of breath and this, however, seems to be related to this episode over the past several months where he reported initially he was thought stung by a bee, developed swelling on the left side of his face and then subsequently developed extreme weakness and there as concern of a rheumatoid arthritis. His symptoms have improved with a steroid burst and tapered. He is currently on prednisone as well as methotrexate. He has also had recent dizziness. He reports head and sinus congestion, which may be contributing to his symptoms. However some of the dizziness is associated with walking up a hill at the end of exercise or walking down hills particularly. Is not associated with change in position or quick head movements.  There is some associated fatigue and chest fullness with exercise and improves with rest, reminiscent of possible  angina.  He underwent bilateral carotid Dopplers which showed a mild amount of plaque. A nuclear stress test performed which demonstrated no reversible ischemia and preserved ejection fraction. He still reports he has some mild dizziness and does fatigue a little at the beginning of exercise but improves when he continues to exercise.  He is generally without complaints today. He reports that recently blood work indicated that his kidney function was worsening and that he is now scheduled to see a nephrologist next Monday. I do not yet have those results in front of me. His metformin was discontinued due to this but he remains on lisinopril and that may need to be adjusted. He denies any specific anginal symptoms. He has some very mild claudication symptoms but it improves with walking. He continues to have a systolic murmur best heard over the aortic area and does have a history of aortic sclerosis in the past.  [name] returns today for follow-up. Unfortunately says he hasn't taken his medicine today. He is supposed to take hydralazine 100 mg 3 times a day and for some reason has not taken 2 doses this morning. Blood pressure was very high at 196/86 but he denied any chest pain or headache. He says he going to take his medicine when he gets home. Is also on Flomax but no other blood pressure medicines. This is probably due to worsening renal function. Currently his creatinine is around 3 and is followed by Dr. Mercy Moore. Is also on Lipitor and cholesterol was recently checked by Dr. Wilson Singer.  10/07/2015  [name]  returns today for follow-up. Over the past year he's had no new complaints although his blood pressures been difficult to control. He's followed by Dr. Mercy Moore. He was previously on amlodipine but that's been discontinued. He is now on hydralazine, clonidine and valsartan. He did not take his medicines today and his blood pressure is elevated. He says he takes the clonidine once or twice  per day. He tries to void in the morning because it causes extreme fatigue. We had previously discussed the Catapres patch however cost was limiting. He also reports recently some worsening memory.  10/16/2016  [name] returns for follow-up. Many years since I last saw him. He's called in for some chest pain which she said got better. His last stress test was in 2016 which was negative for ischemia. He was noted to have some aortic sclerosis in 2015 however last year was noted to have a louder systolic murmur which sounds more like aortic stenosis. Blood pressures not been well controlled. Today's elevated again 160/70. He says his blood pressure medicines make him feel tired. He told me that he takes all for his blood pressure medicines at night. When asked further about his hydralazine which is supposed to be taken 3 times a day he says he does not do that. He then reported that his blood pressure typically runs higher in the morning and throughout the day. He currently denies any chest pain. He reports some left lower strandy swelling but also has numbness and tingling has been undergoing and back injections.  10/27/2016  [name] had adjustments on his medications. He is now taking a hydralazine 3 times a day, irbesartan in the morning and clonidine at night. I reviewed his blood pressures which tend to range in the 140s in the morning before taking medication, in the 120s in the afternoon after medication and typically in the 120s over 80s at night. This indicates adequate treatment of his blood pressure. He did undergo an echocardiogram for aortic stenosis. This demonstrated a normal LVEF of 60-65% with mild aortic stenosis and a mean gradient of 11 mmHg. He understands the implications of this and that we will continue to follow his aortic valve disease.  02/28/2017  [name] presents today with significant shortness of breath, weight gain and leg edema as well as increasing abdominal  girth over the past 2-3 weeks.  The symptoms came on gradually but clinically worsened.  Weight in August was 185 pounds, he now weighs 204 pounds.  Reports worsening shortness of breath, orthopnea and PND.  Blood pressure was also elevated today 183/75 and recently has been difficult to control.  His primary care provider recommended increasing his clonidine to 0.1 mg 3 times daily, however he has had significant daytime fatigue with this.  03/16/2017  Mr. Mccreadie returns today for follow-up of heart failure.  His weight back in August was 185 pounds and he gained up to 204 pounds.  When I last saw him I doubled his diuretic and increased his blood pressure medication.  Over the past 2 weeks he has diuresed back down to 182 pounds.  He feels markedly better.  Blood pressure is better controlled today 132/58.  He feels like his energy level has improved.  We obtain blood work when he was in congestive heart failure which showed an elevated creatinine over 2 and a baseline around 1.6.  He was hypokalemic and I increased his potassium.  His echo showed an EF of 65-70% with grade 2 diastolic  dysfunction and high LV filling pressures.  06/19/2017  Mr. Favila was seen today in follow-up.  I last saw him in January, at which time he was complaining of shortness of breath and edema.  Weight was up to 204 pounds.  He was given diuretics and his weight has come down significantly.  In fact, his creatinine rose up to 2.7 and weight was down to 182.  He saw his nephrologist, Dr. Hollie Salk, recently who decreased his Lasix further and started amlodipine 10 mg daily.  He now says over the past several months he has had progressive fatigue, decreased exercise tolerance and worsening shortness of breath.  He wonders if it is related to his aortic valve.  In January was noted to have normal systolic function and mild aortic stenosis.  On exam, he has what sounds like moderate left ear with an audible second heart sound.  He  again looks volume overloaded today and weight is back up to 193 pounds, up from 182 pounds.  He denies any chest pain.  01/02/2018  Mr. Reish returns today for follow-up of his heart failure.  Recently he has been seen by nephrology.  He felt that he is renal failure has been fairly stable.  Plans were to possibly resume ARB therapy.  His aortic stenosis is mild to moderate and followed by [place].  His EKG shows sinus rhythm at 65.  He denies any worsening shortness of breath.  Weight has been stable.  I had referred him for heart cath in April 2019 which showed two-vessel coronary disease with 3 out of 3 patent bypass grafts.  Medical therapy was recommended however it was felt to be volume overloaded.  Subsequently he was admitted with heart failure and diuresis.  His weight now is down significantly to 170 pounds from 182 most recently.  He seems to be feeling much better.  PMHx:  Past Medical History:  Diagnosis Date  . Arthritis    "a little in LLE" (06/20/2017)  . Chronic diastolic CHF (congestive heart failure) ([place])   . CKD (chronic kidney disease), stage IV ([place])   . Coronary artery disease    a. s/p CABG 1980s. b. stable by cath 05/2017.  Marland Kitchen Dyslipidemia   . Edema 08/08/2017   HANDS & LOWER EXTREMITES  . Hypertension   . Hypoalbuminemia   . Nephrotic range proteinuria   . Peripheral artery disease (HCC)    mild  . Pneumonia ~ 1950 X 1  . Prostate cancer ([place]) ~ 2015  . Type II diabetes mellitus ([place])     Past Surgical History:  Procedure Laterality Date  . CARDIAC CATHETERIZATION     "a couple times" (06/20/2017)  . CATARACT EXTRACTION W/ INTRAOCULAR LENS  IMPLANT, BILATERAL Bilateral   . CORONARY ARTERY BYPASS GRAFT  03/06/1985   LAD and first diagonal/circumflex (sequential saphenous vein graft,cardioplegia  . INSERTION PROSTATE RADIATION SEED  ~ 2015  . NM MYOCAR PERF WALL MOTION  09/12/2006   no ischemia  . RIGHT/LEFT HEART CATH AND CORONARY/GRAFT ANGIOGRAPHY N/A  06/21/2017   Procedure: RIGHT/LEFT HEART CATH AND CORONARY/GRAFT ANGIOGRAPHY;  Surgeon: Burnell Blanks, MD;  Location: [facility];  Service: Cardiovascular;  Laterality: N/A;  . US ECHOCARDIOGRAPHY  11/04/2008   trace TR & MR    FAMHx:  Family History  Problem Relation Age of Onset  . Heart attack Mother     SOCHx:   reports that he quit smoking about 45 years ago. His smoking use included  cigarettes. He has a 20.00 pack-year smoking history. He has never used smokeless tobacco. He reports that he drank alcohol. He reports that he does not use drugs.  ALLERGIES:  No Known Allergies  ROS: Pertinent items noted in HPI and remainder of comprehensive ROS otherwise negative.  HOME MEDS: Current Outpatient Medications  Medication Sig Dispense Refill  . amLODipine (NORVASC) 10 MG tablet Take 10 mg by mouth at bedtime.    Marland Kitchen aspirin EC 81 MG tablet Take 81 mg by mouth daily.    Marland Kitchen atorvastatin (LIPITOR) 40 MG tablet Take 40 mg by mouth at bedtime.     . carboxymethylcellulose (REFRESH PLUS) 0.5 % SOLN Place 1 drop into both eyes 3 (three) times daily as needed (dry, itch).    . folic acid (FOLVITE) 409 MCG tablet Take 800 mcg by mouth daily.    . furosemide (LASIX) 40 MG tablet Take 1 tablet (40 mg total) by mouth 2 (two) times daily. 30 tablet   . glimepiride (AMARYL) 4 MG tablet Take 4 mg by mouth daily with breakfast.     . hydrALAZINE (APRESOLINE) 100 MG tablet Take 100 mg by mouth 3 (three) times daily.    . Multiple Vitamin (MULTIVITAMIN) tablet Take 1 tablet by mouth daily.     . Multiple Vitamins-Minerals (MULTIVITAMIN ADULTS PO) Take 1 tablet by mouth every morning.    Marland Kitchen OVER THE COUNTER MEDICATION Place 1 spray into the nose daily as needed. Sovereign Silver Nasal Spray for Immune Support    . OVER THE COUNTER MEDICATION Take 1 capsule by mouth every morning.    . TRADJENTA 5 MG TABS tablet Take 5 mg by mouth every morning.     . traZODone (DESYREL) 50 MG tablet  Take 50 mg by mouth at bedtime. mg  12   No current facility-administered medications for this visit.     LABS/IMAGING: No results found for this or any previous visit (from the past 48 hour(s)). No results found.  VITALS: BP (!) 158/70 (BP Location: Right Arm, Patient Position: Sitting, Cuff Size: Normal)   Pulse 65   Ht 5' 10.5" (1.791 m)   Wt 170 lb (77.1 kg)   BMI 24.05 kg/m   EXAM: General appearance: alert, no distress and moderately obese Neck: JVD - 2 cm above sternal notch, no carotid bruit and thyroid not enlarged, symmetric, no tenderness/mass/nodules Lungs: clear to auscultation bilaterally Heart: regular rate and rhythm, S1, S2 normal and systolic murmur: systolic ejection 3/6, crescendo at 2nd right intercostal space Abdomen: soft, non-tender; bowel sounds normal; no masses,  no organomegaly Extremities: edema trace bilateral edema Pulses: 2+ and symmetric Skin: Skin color, texture, turgor normal. No rashes or lesions Neurologic: Grossly normal Psych: Pleasant  EKG: Emil sinus rhythm at 65, moderate voltage criteria for LVH-personally reviewed  ASSESSMENT: 1.   Chronic diastolic congestive heart failure 1. AKI on CKD2 2. Mild aortic stenosis 3. Coronary artery disease status post CABG in 1987 -patent bypass grafts by cath (05/2017) 4. Hypertension 5. Hyperlipidemia 6. Diabetes type 2 7. Mild PAD - intermittent claudication, not lifestyle limiting 8. Possible rheumatoid arthritis  PLAN: 1.   Mr. Peter is found to have patent bypass grafts in April 2019 by cath but signs and symptoms of diastolic heart failure.  His symptoms worsened and he was admitted subsequently diuresed.  His renal function is tenuous.  He is followed by nephrology.  At this point he appears to be euvolemic and his creatinine has stabilized.  His  weight is much lower than it had been previously and his dry weight now is somewhere around 170 pounds.  We will continue his current dose of  diuretics with assistance by nephrology.  It is noted that blood pressure and heart rate are elevated today.  He is not on beta-blocker.  He was previously on Bystolic which was discontinued due to bradycardia however he was on the 10 mg dose.  This point I feel like he might tolerate low-dose carvedilol.  We will start 3.125 mg twice daily.  Follow up in 6 months or sooner as necessary.  Pixie Casino, MD, [facility] [place] Director of the Advanced Lipid Disorders &  Cardiovascular Risk Reduction Clinic Attending Cardiologist  Direct Dial: [phone]  Fax: [phone]  Website:  [email or web address] 01/02/2018, 4:13 PM

## 2018-01-02 NOTE — Patient Instructions (Signed)
Medication Instructions:  START carvedilol 3.125mg  twice daily If you need a refill on your cardiac medications before your next appointment, please call your pharmacy.   Lab work: NONE  Testing/Procedures: NONE  Follow-Up: At [facility], you and your health needs are our priority.  As part of our continuing mission to provide you with exceptional heart care, we have created designated Provider Care Teams.  These Care Teams include your primary Cardiologist (physician) and Advanced Practice Providers (APPs -  Physician Assistants and Nurse Practitioners) who all work together to provide you with the care you need, when you need it. You will need a follow up appointment in 6 months.  Please call our office 2 months in advance to schedule this appointment.  You may see Pixie Casino, MD or one of the following Advanced Practice Providers on your designated Care Team: [place], [place] . Fabian Sharp, PA-C  Any Other Special Instructions Will Be Listed Below (If Applicable).

## 2018-01-03 ENCOUNTER — Ambulatory Visit: Payer: Medicare Other | Admitting: Cardiology

## 2018-01-04 ENCOUNTER — Encounter: Payer: Self-pay | Admitting: Internal Medicine

## 2018-01-23 DIAGNOSIS — N184 Chronic kidney disease, stage 4 (severe): Secondary | ICD-10-CM | POA: Diagnosis not present

## 2018-01-23 DIAGNOSIS — D631 Anemia in chronic kidney disease: Secondary | ICD-10-CM | POA: Diagnosis not present

## 2018-01-23 DIAGNOSIS — E1122 Type 2 diabetes mellitus with diabetic chronic kidney disease: Secondary | ICD-10-CM | POA: Diagnosis not present

## 2018-01-23 DIAGNOSIS — N2581 Secondary hyperparathyroidism of renal origin: Secondary | ICD-10-CM | POA: Diagnosis not present

## 2018-01-23 DIAGNOSIS — N189 Chronic kidney disease, unspecified: Secondary | ICD-10-CM | POA: Diagnosis not present

## 2018-01-23 DIAGNOSIS — I129 Hypertensive chronic kidney disease with stage 1 through stage 4 chronic kidney disease, or unspecified chronic kidney disease: Secondary | ICD-10-CM | POA: Diagnosis not present

## 2018-02-05 DIAGNOSIS — E113491 Type 2 diabetes mellitus with severe nonproliferative diabetic retinopathy without macular edema, right eye: Secondary | ICD-10-CM | POA: Diagnosis not present

## 2018-02-05 DIAGNOSIS — H43813 Vitreous degeneration, bilateral: Secondary | ICD-10-CM | POA: Diagnosis not present

## 2018-02-05 DIAGNOSIS — E113412 Type 2 diabetes mellitus with severe nonproliferative diabetic retinopathy with macular edema, left eye: Secondary | ICD-10-CM | POA: Diagnosis not present

## 2018-02-28 DIAGNOSIS — E119 Type 2 diabetes mellitus without complications: Secondary | ICD-10-CM | POA: Diagnosis not present

## 2018-02-28 DIAGNOSIS — I10 Essential (primary) hypertension: Secondary | ICD-10-CM | POA: Diagnosis not present

## 2018-03-06 DIAGNOSIS — E119 Type 2 diabetes mellitus without complications: Secondary | ICD-10-CM | POA: Diagnosis not present

## 2018-03-06 DIAGNOSIS — E118 Type 2 diabetes mellitus with unspecified complications: Secondary | ICD-10-CM | POA: Diagnosis not present

## 2018-03-06 DIAGNOSIS — E789 Disorder of lipoprotein metabolism, unspecified: Secondary | ICD-10-CM | POA: Diagnosis not present

## 2018-03-06 DIAGNOSIS — I2510 Atherosclerotic heart disease of native coronary artery without angina pectoris: Secondary | ICD-10-CM | POA: Diagnosis not present

## 2018-03-06 DIAGNOSIS — E1122 Type 2 diabetes mellitus with diabetic chronic kidney disease: Secondary | ICD-10-CM | POA: Diagnosis not present

## 2018-03-27 DIAGNOSIS — I350 Nonrheumatic aortic (valve) stenosis: Secondary | ICD-10-CM | POA: Diagnosis not present

## 2018-03-27 DIAGNOSIS — E1122 Type 2 diabetes mellitus with diabetic chronic kidney disease: Secondary | ICD-10-CM | POA: Diagnosis not present

## 2018-03-27 DIAGNOSIS — I5032 Chronic diastolic (congestive) heart failure: Secondary | ICD-10-CM | POA: Diagnosis not present

## 2018-03-27 DIAGNOSIS — N184 Chronic kidney disease, stage 4 (severe): Secondary | ICD-10-CM | POA: Diagnosis not present

## 2018-03-27 DIAGNOSIS — N2581 Secondary hyperparathyroidism of renal origin: Secondary | ICD-10-CM | POA: Diagnosis not present

## 2018-04-11 ENCOUNTER — Other Ambulatory Visit: Payer: Self-pay | Admitting: *Deleted

## 2018-04-11 NOTE — Patient Outreach (Signed)
[facility]) Care Management  04/11/2018  [name] 1934-02-19 [id number]   RN Health Coach attempted follow up outreach call to patient.  Patient was unavailable. HIPPA compliance voicemail message left with return callback number.  Plan: RN will call patient again within 30 days.  [place] Care Management [phone]

## 2018-04-16 ENCOUNTER — Other Ambulatory Visit: Payer: Self-pay | Admitting: *Deleted

## 2018-04-16 NOTE — Patient Outreach (Signed)
[facility]) Care Management  04/16/2018  [name] Oct 24, 1933 [id number]   RN Health Coach attempted follow up outreach call to patient.  Patient was unavailable. HIPPA compliance voicemail message left with return callback number.  Plan: RN will call patient again within 30  days.  [place] Care Management [phone]

## 2018-04-17 DIAGNOSIS — E1122 Type 2 diabetes mellitus with diabetic chronic kidney disease: Secondary | ICD-10-CM | POA: Diagnosis not present

## 2018-04-17 DIAGNOSIS — E119 Type 2 diabetes mellitus without complications: Secondary | ICD-10-CM | POA: Diagnosis not present

## 2018-04-17 DIAGNOSIS — I2510 Atherosclerotic heart disease of native coronary artery without angina pectoris: Secondary | ICD-10-CM | POA: Diagnosis not present

## 2018-04-17 DIAGNOSIS — N183 Chronic kidney disease, stage 3 (moderate): Secondary | ICD-10-CM | POA: Diagnosis not present

## 2018-04-18 NOTE — Patient Outreach (Signed)
[facility]) [facility]) Care Management  04/18/2018   [name] 1933/07/04 [id number]  RN Health Coach received return telephone call from patient.  [place] compliance verified. Per patient he is doing pretty good. RN discussed eating healthy low sodium,, diabetic diet. RN reiterated the importance of weighing and monitoring fluid. Patient is monitoring blood sugars and interested in  getting a Free Style Libre continuous glucometer and pay out of pocket if necessary. Patient is aware that on oral agents that his insurance may not cover for the meter. Patient has agreed to follow up outreach calls.   Current Medications:  Current Outpatient Medications  Medication Sig Dispense Refill  . amLODipine (NORVASC) 10 MG tablet Take 10 mg by mouth at bedtime.    Marland Kitchen aspirin EC 81 MG tablet Take 81 mg by mouth daily.    Marland Kitchen atorvastatin (LIPITOR) 40 MG tablet Take 40 mg by mouth at bedtime.     . carboxymethylcellulose (REFRESH PLUS) 0.5 % SOLN Place 1 drop into both eyes 3 (three) times daily as needed (dry, itch).    . carvedilol (COREG) 3.125 MG tablet Take 1 tablet (3.125 mg total) by mouth 2 (two) times daily with a meal. 166 tablet 3  . folic acid (FOLVITE) 063 MCG tablet Take 800 mcg by mouth daily.    . furosemide (LASIX) 40 MG tablet Take 1 tablet (40 mg total) by mouth 2 (two) times daily. 30 tablet   . glimepiride (AMARYL) 4 MG tablet Take 4 mg by mouth daily with breakfast.     . hydrALAZINE (APRESOLINE) 100 MG tablet Take 100 mg by mouth 3 (three) times daily.    . Multiple Vitamin (MULTIVITAMIN) tablet Take 1 tablet by mouth daily.     . Multiple Vitamins-Minerals (MULTIVITAMIN ADULTS PO) Take 1 tablet by mouth every morning.    Marland Kitchen OVER THE COUNTER MEDICATION Place 1 spray into the nose daily as needed. Sovereign Silver Nasal Spray for Immune Support    . OVER THE COUNTER MEDICATION Take 1 capsule by mouth every morning.    .  TRADJENTA 5 MG TABS tablet Take 5 mg by mouth every morning.     . traZODone (DESYREL) 50 MG tablet Take 50 mg by mouth at bedtime. mg  12   No current facility-administered medications for this visit.     Functional Status:  In your present state of health, do you have any difficulty performing the following activities: 04/16/2018 08/28/2017  Hearing? N N  Vision? N N  Comment - -  Difficulty concentrating or making decisions? N N  Walking or climbing stairs? Y Y  Comment Patient uses a cane when he goes out and a walker in the house. He has some dizziness when he stands -  Dressing or bathing? N N  Doing errands, shopping? N N  Preparing Food and eating ? N N  Using the Toilet? N N  In the past six months, have you accidently leaked urine? Y N  Comment When on higher dosage of lasix -  Do you have problems with loss of bowel control? N N  Managing your Medications? N N  Managing your Finances? N N  Housekeeping or managing your Housekeeping? [name]  Comment wife does this wife does  Some recent data might be hidden    Fall/Depression Screening: Fall Risk  04/16/2018 10/01/2017 08/28/2017  Falls in the past year? 0 No No  Risk for fall due to :  Impaired balance/gait;Impaired mobility;Impaired vision - -  Follow up Falls evaluation completed;Falls prevention discussed - -   PHQ 2/9 Scores 04/16/2018 10/01/2017 08/28/2017  PHQ - 2 Score 0 0 0   THN CM Care Plan Problem One     Most Recent Value  Role Documenting the Problem One  [place] for Problem One  Active  THN Long Term Goal   Patient will follow up on maintenance care within the next 90 days  THN Long Term Goal Start Date  04/17/18  Interventions for Problem One Long Term Goal  RN discussed the maintenance care and the importance to follow up. Patient will follow up with eye Dr and PCP.   THN CM Short Term Goal #2   Patient will verbalize receiving information on Hypertension within the next 30 days  THN CM Short  Term Goal #2 Met Date  04/16/18  [facility] CM Short Term Goal #3  Patient will verbalize eating low sodium heart healthy diet within the next 30 days  THN CM Short Term Goal #3 Met Date  04/16/18  [facility] CM Short Term Goal #5   Patient will have a better understanding of healthy snacks within the next 30 days  THN CM Short Term Goal #5 Met Date  04/16/18       Assessment:  Patient is checking blood sugars Patient is monitoring weight and fluid Patient understands zones and action plan of CHF   Plan:  Rn discussed Health Maintenance RN sent 2020 Calendar book Patient will look into free style libre with Physician, pharmacy and benefits RN will follow up outreach within the month of May  [name] [place] Management [phone]

## 2018-05-16 DIAGNOSIS — I2510 Atherosclerotic heart disease of native coronary artery without angina pectoris: Secondary | ICD-10-CM | POA: Diagnosis not present

## 2018-05-16 DIAGNOSIS — E1122 Type 2 diabetes mellitus with diabetic chronic kidney disease: Secondary | ICD-10-CM | POA: Diagnosis not present

## 2018-05-16 DIAGNOSIS — E119 Type 2 diabetes mellitus without complications: Secondary | ICD-10-CM | POA: Diagnosis not present

## 2018-05-16 DIAGNOSIS — N183 Chronic kidney disease, stage 3 (moderate): Secondary | ICD-10-CM | POA: Diagnosis not present

## 2018-05-27 ENCOUNTER — Other Ambulatory Visit: Payer: Self-pay

## 2018-05-27 DIAGNOSIS — E1122 Type 2 diabetes mellitus with diabetic chronic kidney disease: Secondary | ICD-10-CM | POA: Diagnosis not present

## 2018-05-27 DIAGNOSIS — N184 Chronic kidney disease, stage 4 (severe): Secondary | ICD-10-CM | POA: Diagnosis not present

## 2018-05-27 DIAGNOSIS — D631 Anemia in chronic kidney disease: Secondary | ICD-10-CM | POA: Diagnosis not present

## 2018-05-27 DIAGNOSIS — N189 Chronic kidney disease, unspecified: Secondary | ICD-10-CM | POA: Diagnosis not present

## 2018-05-27 DIAGNOSIS — I5032 Chronic diastolic (congestive) heart failure: Secondary | ICD-10-CM | POA: Diagnosis not present

## 2018-05-27 DIAGNOSIS — N2581 Secondary hyperparathyroidism of renal origin: Secondary | ICD-10-CM | POA: Diagnosis not present

## 2018-05-27 NOTE — Patient Outreach (Signed)
[place] [facility]) Care Management  05/27/2018  [name] 1933/08/31 [id number]   Medication Adherence call to Mr. [name] spoke with patient he is due on Glimepiride 4 mg he explain doctor change  the instructions he is only taking it as needed and soon he wont be taking it any more. [name] is showing past due under [place].   [place] Management Direct Dial [phone]  Fax [phone] [email or web address]

## 2018-05-29 DIAGNOSIS — E789 Disorder of lipoprotein metabolism, unspecified: Secondary | ICD-10-CM | POA: Diagnosis not present

## 2018-05-29 DIAGNOSIS — E1122 Type 2 diabetes mellitus with diabetic chronic kidney disease: Secondary | ICD-10-CM | POA: Diagnosis not present

## 2018-06-05 DIAGNOSIS — E1122 Type 2 diabetes mellitus with diabetic chronic kidney disease: Secondary | ICD-10-CM | POA: Diagnosis not present

## 2018-06-05 DIAGNOSIS — N183 Chronic kidney disease, stage 3 (moderate): Secondary | ICD-10-CM | POA: Diagnosis not present

## 2018-06-05 DIAGNOSIS — I10 Essential (primary) hypertension: Secondary | ICD-10-CM | POA: Diagnosis not present

## 2018-06-05 DIAGNOSIS — N189 Chronic kidney disease, unspecified: Secondary | ICD-10-CM | POA: Diagnosis not present

## 2018-06-05 DIAGNOSIS — Z0000 Encounter for general adult medical examination without abnormal findings: Secondary | ICD-10-CM | POA: Diagnosis not present

## 2018-06-19 ENCOUNTER — Other Ambulatory Visit: Payer: Self-pay

## 2018-06-19 NOTE — Patient Outreach (Signed)
[facility]) Care Management  06/19/2018  ELIAB CLOSSON May 03, 1933 [id number]   Medication Adherence call to Mr. [place] Compliant Voice message left with a call back number. Mr. Heitman is showing due on Glimepiride 4 mg under [place].   [place] Management Direct Dial [phone]  Fax [phone] [email or web address]

## 2018-06-20 ENCOUNTER — Other Ambulatory Visit: Payer: Self-pay

## 2018-06-20 NOTE — Patient Outreach (Signed)
[place] [facility]) Care Management  06/20/2018  GILFORD LARDIZABAL [birth year] [id number]   Medication Adherence call back from Mr. Wonda Olds patient said he is only taking Glimepiride 4 mg as needed per doctor new instructions he is now taking Trajenta  Mr. Orrico was showing due under [place].     [place] Management Direct Dial [phone]  Fax [phone] [email or web address]

## 2018-06-25 ENCOUNTER — Telehealth: Payer: Self-pay | Admitting: *Deleted

## 2018-06-25 NOTE — Telephone Encounter (Signed)
A message was left at @ 10:54 am, to give our office a call.

## 2018-07-15 ENCOUNTER — Other Ambulatory Visit: Payer: Self-pay | Admitting: *Deleted

## 2018-07-15 NOTE — Patient Outreach (Signed)
[facility]) Care Management  07/15/2018  [name] 10-20-1933 [id number]   RN Health Coach attempted follow up outreach call to patient.  Patient was unavailable. HIPPA compliance message with patient wife. Patient was still resting. RN left call back number. Plan: RN will call patient again within 30 days.  [place] Care Management [phone]

## 2018-07-16 DIAGNOSIS — E119 Type 2 diabetes mellitus without complications: Secondary | ICD-10-CM | POA: Diagnosis not present

## 2018-07-16 DIAGNOSIS — N183 Chronic kidney disease, stage 3 (moderate): Secondary | ICD-10-CM | POA: Diagnosis not present

## 2018-07-16 DIAGNOSIS — E1122 Type 2 diabetes mellitus with diabetic chronic kidney disease: Secondary | ICD-10-CM | POA: Diagnosis not present

## 2018-07-16 DIAGNOSIS — I2510 Atherosclerotic heart disease of native coronary artery without angina pectoris: Secondary | ICD-10-CM | POA: Diagnosis not present

## 2018-07-29 ENCOUNTER — Encounter: Payer: Self-pay | Admitting: *Deleted

## 2018-08-07 DIAGNOSIS — D631 Anemia in chronic kidney disease: Secondary | ICD-10-CM | POA: Diagnosis not present

## 2018-08-07 DIAGNOSIS — R5383 Other fatigue: Secondary | ICD-10-CM | POA: Diagnosis not present

## 2018-08-07 DIAGNOSIS — N184 Chronic kidney disease, stage 4 (severe): Secondary | ICD-10-CM | POA: Diagnosis not present

## 2018-08-07 DIAGNOSIS — E1129 Type 2 diabetes mellitus with other diabetic kidney complication: Secondary | ICD-10-CM | POA: Diagnosis not present

## 2018-08-07 DIAGNOSIS — N2581 Secondary hyperparathyroidism of renal origin: Secondary | ICD-10-CM | POA: Diagnosis not present

## 2018-08-07 DIAGNOSIS — N189 Chronic kidney disease, unspecified: Secondary | ICD-10-CM | POA: Diagnosis not present

## 2018-08-07 DIAGNOSIS — E1122 Type 2 diabetes mellitus with diabetic chronic kidney disease: Secondary | ICD-10-CM | POA: Diagnosis not present

## 2018-08-07 DIAGNOSIS — I5032 Chronic diastolic (congestive) heart failure: Secondary | ICD-10-CM | POA: Diagnosis not present

## 2018-08-13 ENCOUNTER — Other Ambulatory Visit: Payer: Self-pay | Admitting: *Deleted

## 2018-08-13 NOTE — Telephone Encounter (Signed)
This encounter was created in error - please disregard.

## 2018-09-03 DIAGNOSIS — E113491 Type 2 diabetes mellitus with severe nonproliferative diabetic retinopathy without macular edema, right eye: Secondary | ICD-10-CM | POA: Diagnosis not present

## 2018-09-03 DIAGNOSIS — E113412 Type 2 diabetes mellitus with severe nonproliferative diabetic retinopathy with macular edema, left eye: Secondary | ICD-10-CM | POA: Diagnosis not present

## 2018-09-24 DIAGNOSIS — E1122 Type 2 diabetes mellitus with diabetic chronic kidney disease: Secondary | ICD-10-CM | POA: Diagnosis not present

## 2018-09-24 DIAGNOSIS — R42 Dizziness and giddiness: Secondary | ICD-10-CM | POA: Diagnosis not present

## 2018-09-30 NOTE — Patient Outreach (Signed)
[facility] [facility]) Care Management  08/13/2018 Late entry  [name] 1933-08-10 [id number]  [name] telephone call to patient.  Hipaa compliance verified. Per patient he has been doing pretty good. He weighed 163 lbs, His blood pressure was 152/79. Patient has not had any recent falls. Per patient he has maintained staying on his diet. Per patient he is follow pandemic safety precautions. Patient has agrred to further outreach calls.   Current Medications:  Current Outpatient Medications  Medication Sig Dispense Refill  . amLODipine (NORVASC) 10 MG tablet Take 10 mg by mouth at bedtime.    Marland Kitchen aspirin EC 81 MG tablet Take 81 mg by mouth daily.    Marland Kitchen atorvastatin (LIPITOR) 40 MG tablet Take 40 mg by mouth at bedtime.     . carboxymethylcellulose (REFRESH PLUS) 0.5 % SOLN Place 1 drop into both eyes 3 (three) times daily as needed (dry, itch).    . carvedilol (COREG) 3.125 MG tablet Take 1 tablet (3.125 mg total) by mouth 2 (two) times daily with a meal. 811 tablet 3  . folic acid (FOLVITE) 572 MCG tablet Take 800 mcg by mouth daily.    . furosemide (LASIX) 40 MG tablet Take 1 tablet (40 mg total) by mouth 2 (two) times daily. 30 tablet   . glimepiride (AMARYL) 4 MG tablet Take 4 mg by mouth daily with breakfast.     . hydrALAZINE (APRESOLINE) 100 MG tablet Take 100 mg by mouth 3 (three) times daily.    . Multiple Vitamin (MULTIVITAMIN) tablet Take 1 tablet by mouth daily.     . Multiple Vitamins-Minerals (MULTIVITAMIN ADULTS PO) Take 1 tablet by mouth every morning.    Marland Kitchen OVER THE COUNTER MEDICATION Place 1 spray into the nose daily as needed. Sovereign Silver Nasal Spray for Immune Support    . OVER THE COUNTER MEDICATION Take 1 capsule by mouth every morning.    . TRADJENTA 5 MG TABS tablet Take 5 mg by mouth every morning.     . traZODone (DESYREL) 50 MG tablet Take 50 mg by mouth at bedtime. mg  12   No current facility-administered medications for this visit.      Functional Status:  In your present state of health, do you have any difficulty performing the following activities: 04/16/2018  Hearing? N  Vision? N  Difficulty concentrating or making decisions? N  Walking or climbing stairs? Y  Comment Patient uses a cane when he goes out and a walker in the house. He has some dizziness when he stands  Dressing or bathing? N  Doing errands, shopping? N  Preparing Food and eating ? N  Using the Toilet? N  In the past six months, have you accidently leaked urine? Y  Comment When on higher dosage of lasix  Do you have problems with loss of bowel control? N  Managing your Medications? N  Managing your Finances? N  Housekeeping or managing your Housekeeping? Y  Comment wife does this  Some recent data might be hidden    Fall/Depression Screening: Fall Risk  08/13/2018 04/16/2018 10/01/2017  Falls in the past year? 0 0 No  Risk for fall due to : - Impaired balance/gait;Impaired mobility;Impaired vision -  Follow up - Falls evaluation completed;Falls prevention discussed -   PHQ 2/9 Scores 08/13/2018 04/16/2018 10/01/2017 08/28/2017  PHQ - 2 Score 0 0 0 0   THN CM Care Plan Problem One     Most Recent Value  Care Plan  Problem One  Knowledge Deficit in Self Management of Congestive Heart Failure  Role Documenting the Problem One  [place] for Problem One  Active  THN Long Term Goal   Patient will follow up on maintenance care within the next 90 days  Interventions for Problem One Long Term Goal  RN reiterated rescheduling appointments cancelled due to COVID 19. RN will follow up with compliance      Assessment:  Patient is weighing daily wt today 163 pounds Patient is checking blood pressures BP today 152/79 Patient is maintaining heart healthy, low sodium renal diet No falls Appetite good Plan:  RN reiterated CHF zones and action plan RN discussed pandemic safety precautions RN sent 2 cloth masks Patient will schedule health  maintenance checks RN will follow up outreach call within the month of September  [name] Management [phone]

## 2018-10-01 DIAGNOSIS — E119 Type 2 diabetes mellitus without complications: Secondary | ICD-10-CM | POA: Diagnosis not present

## 2018-10-01 DIAGNOSIS — N183 Chronic kidney disease, stage 3 (moderate): Secondary | ICD-10-CM | POA: Diagnosis not present

## 2018-10-01 DIAGNOSIS — I2510 Atherosclerotic heart disease of native coronary artery without angina pectoris: Secondary | ICD-10-CM | POA: Diagnosis not present

## 2018-10-01 DIAGNOSIS — E1122 Type 2 diabetes mellitus with diabetic chronic kidney disease: Secondary | ICD-10-CM | POA: Diagnosis not present

## 2018-10-08 DIAGNOSIS — E113592 Type 2 diabetes mellitus with proliferative diabetic retinopathy without macular edema, left eye: Secondary | ICD-10-CM | POA: Diagnosis not present

## 2018-10-08 DIAGNOSIS — N189 Chronic kidney disease, unspecified: Secondary | ICD-10-CM | POA: Diagnosis not present

## 2018-10-08 DIAGNOSIS — E119 Type 2 diabetes mellitus without complications: Secondary | ICD-10-CM | POA: Diagnosis not present

## 2018-10-10 DIAGNOSIS — N189 Chronic kidney disease, unspecified: Secondary | ICD-10-CM | POA: Diagnosis not present

## 2018-10-10 DIAGNOSIS — N2581 Secondary hyperparathyroidism of renal origin: Secondary | ICD-10-CM | POA: Diagnosis not present

## 2018-10-10 DIAGNOSIS — N184 Chronic kidney disease, stage 4 (severe): Secondary | ICD-10-CM | POA: Diagnosis not present

## 2018-10-10 DIAGNOSIS — D631 Anemia in chronic kidney disease: Secondary | ICD-10-CM | POA: Diagnosis not present

## 2018-10-10 DIAGNOSIS — I5032 Chronic diastolic (congestive) heart failure: Secondary | ICD-10-CM | POA: Diagnosis not present

## 2018-10-10 DIAGNOSIS — E1122 Type 2 diabetes mellitus with diabetic chronic kidney disease: Secondary | ICD-10-CM | POA: Diagnosis not present

## 2018-11-01 ENCOUNTER — Other Ambulatory Visit: Payer: Self-pay

## 2018-11-01 ENCOUNTER — Encounter: Payer: Self-pay | Admitting: Physician Assistant

## 2018-11-01 ENCOUNTER — Ambulatory Visit (INDEPENDENT_AMBULATORY_CARE_PROVIDER_SITE_OTHER): Payer: Medicare Other | Admitting: General Practice

## 2018-11-01 VITALS — BP 172/72 | HR 63 | Ht 70.5 in | Wt 171.0 lb

## 2018-11-01 DIAGNOSIS — I350 Nonrheumatic aortic (valve) stenosis: Secondary | ICD-10-CM | POA: Diagnosis not present

## 2018-11-01 DIAGNOSIS — I25810 Atherosclerosis of coronary artery bypass graft(s) without angina pectoris: Secondary | ICD-10-CM | POA: Diagnosis not present

## 2018-11-01 DIAGNOSIS — I5031 Acute diastolic (congestive) heart failure: Secondary | ICD-10-CM | POA: Diagnosis not present

## 2018-11-01 DIAGNOSIS — I10 Essential (primary) hypertension: Secondary | ICD-10-CM

## 2018-11-01 DIAGNOSIS — I739 Peripheral vascular disease, unspecified: Secondary | ICD-10-CM | POA: Diagnosis not present

## 2018-11-01 DIAGNOSIS — Z951 Presence of aortocoronary bypass graft: Secondary | ICD-10-CM

## 2018-11-01 DIAGNOSIS — E785 Hyperlipidemia, unspecified: Secondary | ICD-10-CM

## 2018-11-01 NOTE — Progress Notes (Addendum)
Cardiology Clinic Note   Patient Name: [name] Date of Encounter: 11/01/2018  Primary Care Provider:  Jani Gravel, MD Primary Cardiologist:  Nathaniel Casino, MD  Patient Profile    [name] 85 year old male presents today for 27-month follow-up for his coronary artery disease, essential hypertension, HF and peripheral arterial disease.  Past Medical History    Past Medical History:  Diagnosis Date   Arthritis    "a little in LLE" (06/20/2017)   Chronic diastolic CHF (congestive heart failure) (HCC)    CKD (chronic kidney disease), stage IV ([place])    Coronary artery disease    a. s/p CABG 1980s. b. stable by cath 05/2017.   Dyslipidemia    Edema 08/08/2017   HANDS & LOWER EXTREMITES   Hypertension    Hypoalbuminemia    Nephrotic range proteinuria    Peripheral artery disease (HCC)    mild   Pneumonia ~ 1950 X 1   Prostate cancer ([place]) ~ 2015   Type II diabetes mellitus ([place])    Past Surgical History:  Procedure Laterality Date   CARDIAC CATHETERIZATION     "a couple times" (06/20/2017)   CATARACT EXTRACTION W/ INTRAOCULAR LENS  IMPLANT, BILATERAL Bilateral    CORONARY ARTERY BYPASS GRAFT  03/06/1985   LAD and first diagonal/circumflex (sequential saphenous vein graft,cardioplegia   INSERTION PROSTATE RADIATION SEED  ~ 2015   NM MYOCAR PERF WALL MOTION  09/12/2006   no ischemia   RIGHT/LEFT HEART CATH AND CORONARY/GRAFT ANGIOGRAPHY N/A 06/21/2017   Procedure: RIGHT/LEFT HEART CATH AND CORONARY/GRAFT ANGIOGRAPHY;  Surgeon: Nathaniel Blanks, MD;  Location: [facility];  Service: Cardiovascular;  Laterality: N/A;   US ECHOCARDIOGRAPHY  11/04/2008   trace TR & MR    Allergies  No Known Allergies  History of Present Illness    [name] was last seen by [name] on 01/02/2018.  During that time he was also seeing nephrology and his renal failure was thought to be stable.  His aortic stenosis at that time was mild to  moderate.  His EKG was sinus rhythm and 65 bpm, he denied increased shortness of breath, and his weight had been stable.  A cardiac catheterization on April 2019 showed two-vessel coronary artery disease with 3 out of 3 patent bypass grafts.  Medical therapy was recommended at that time and his symptoms were thought to be caused by fluid volume overload.  He was then admitted with heart failure and diuresed, his weight went from 182 pounds to 170 pounds. His echocardiogram from 02/28/2017 showed an LVEF of 65 to 20% grade 2 diastolic dysfunction and mild aortic stenosis with peak velocity of 269 cm/s and a mean gradient of 16 mmHg.  His left atrium is moderately dilated.  His PMH also includes CABGx3 [id number], chronic diastolic CHF, CKD, coronary artery disease, dyslipidemia, essential hypertension, PAD, pneumonia, prostate cancer 2015, and type 2 diabetes.  He presents to the clinic today and states that he feels well.  He has been exercising at least 1 hour/day.  He breaks up his walks into 2-3 different sessions.  He states that he has recently seen [name] at [facility] and was informed that he will need to go on dialysis and he is concerned about his heart health.  He has not yet been evaluated for fistula/graft.  He states other than these concerns he feels well at this time.  He has been monitoring his weights and is only about  1 pound up from his discharge weight in June 2019 when he had a bout of CHF exacerbation.  He denies chest pain, shortness of breath, lower extremity edema, fatigue, palpitations, melena, hematuria, hemoptysis, diaphoresis, weakness, presyncope, syncope, orthopnea, and PND.   Home Medications    Prior to Admission medications   Medication Sig Start Date End Date Taking? Authorizing Provider  amLODipine (NORVASC) 10 MG tablet Take 10 mg by mouth at bedtime.    [provider]  aspirin EC 81 MG tablet Take 81 mg by mouth daily.    [provider]  atorvastatin (LIPITOR) 40 MG tablet Take 40 mg by mouth at bedtime.  08/09/12   [provider]  carboxymethylcellulose (REFRESH PLUS) 0.5 % SOLN Place 1 drop into both eyes 3 (three) times daily as needed (dry, itch).    [provider]  carvedilol (COREG) 3.125 MG tablet Take 1 tablet (3.125 mg total) by mouth 2 (two) times daily with a meal. 01/02/18   Hilty, Nadean Corwin, MD  folic acid (FOLVITE) 867 MCG tablet Take 800 mcg by mouth daily.    [provider]  furosemide (LASIX) 40 MG tablet Take 1 tablet (40 mg total) by mouth 2 (two) times daily. 09/01/17   Nathaniel Dopp T, PA-C  glimepiride (AMARYL) 4 MG tablet Take 4 mg by mouth daily with breakfast.     [provider]  hydrALAZINE (APRESOLINE) 100 MG tablet Take 100 mg by mouth 3 (three) times daily.    [provider]  Multiple Vitamin (MULTIVITAMIN) tablet Take 1 tablet by mouth daily.     [provider]  Multiple Vitamins-Minerals (MULTIVITAMIN ADULTS PO) Take 1 tablet by mouth every morning.    [provider]  OVER THE COUNTER MEDICATION Place 1 spray into the nose daily as needed. Sovereign Silver Nasal Spray for Immune Support    [provider]  OVER THE COUNTER MEDICATION Take 1 capsule by mouth every morning.    [provider]  TRADJENTA 5 MG TABS tablet Take 5 mg by mouth every morning.  02/27/17   [provider]  traZODone (DESYREL) 50 MG tablet Take 50 mg by mouth at bedtime. mg 07/12/17   [provider]    Family History    Family History  Problem Relation Age of Onset   Heart attack Mother    He indicated that his mother is deceased. He indicated that his father is deceased. He indicated that his maternal grandmother is deceased. He indicated that his maternal grandfather is deceased. He indicated that his paternal grandmother is deceased. He indicated that his paternal grandfather is deceased.  Social History    Social  History   Socioeconomic History   Marital status: Married    Spouse name: Not on file   Number of children: Not on file   Years of education: Not on file   Highest education level: Not on file  Occupational History   Not on file  Social Needs   Financial resource strain: Not on file   Food insecurity    Worry: Not on file    Inability: Not on file   Transportation needs    Medical: Not on file    Non-medical: Not on file  Tobacco Use   Smoking status: Former Smoker    Packs/day: 1.00    Years: 20.00    Pack years: 20.00    Types: Cigarettes    Quit date: 08/22/1972    Years since  quitting: 46.2   Smokeless tobacco: Never Used  Substance and Sexual Activity   Alcohol use: Not Currently    Comment: 06/20/2017 "nothing since ~ 1974"   Drug use: No   Sexual activity: Not Currently  Lifestyle   Physical activity    Days per week: Not on file    Minutes per session: Not on file   Stress: Not on file  Relationships   Social connections    Talks on phone: Not on file    Gets together: Not on file    Attends religious service: Not on file    Active member of club or organization: Not on file    Attends meetings of clubs or organizations: Not on file    Relationship status: Not on file   Intimate partner violence    Fear of current or ex partner: Not on file    Emotionally abused: Not on file    Physically abused: Not on file    Forced sexual activity: Not on file  Other Topics Concern   Not on file  Social History Narrative   Not on file     Review of Systems    General:  No chills, fever, night sweats or weight changes.  Cardiovascular:  No chest pain, dyspnea on exertion, edema, orthopnea, palpitations, paroxysmal nocturnal dyspnea. Dermatological: No rash, lesions/masses Respiratory: No cough, dyspnea Urologic: No hematuria, dysuria Abdominal:   No nausea, vomiting, diarrhea, bright red blood per rectum, melena, or hematemesis Neurologic:  No  visual changes, wkns, changes in mental status. All other systems reviewed and are otherwise negative except as noted above.  Physical Exam    VS:  BP (!) 172/72    Pulse 63    Ht 5' 10.5" (1.791 m)    Wt 171 lb (77.6 kg)    BMI 24.19 kg/m  , BMI Body mass index is 24.19 kg/m. GEN: Well nourished, well developed, in no acute distress. HEENT: normal. Neck: Supple, no JVD, carotid bruits, or masses. Cardiac: RRR,+ systolic murmur 3/6, crescendo at 2nd right intercostal space, rubs, or gallops. No clubbing, cyanosis, edema.  Radials/DP/PT 2+ and equal bilaterally.  Respiratory:  Respirations regular and unlabored, clear to auscultation bilaterally. GI: Soft, nontender, nondistended, BS + x 4. MS: no deformity or atrophy. Skin: warm and dry, no rash. Neuro:  Strength and sensation are intact. Psych: Normal affect.  Accessory Clinical Findings    ECG personally reviewed by me today-sinus rhythm 63 bpm- No acute changes  EKG 01/02/2018 Normal sinus rhythm 65 bpm  Echocardiogram 02/28/2017 Study Conclusions  - Left ventricle: The cavity size was normal. There was mild   concentric hypertrophy. Systolic function was vigorous. The   estimated ejection fraction was in the range of 65% to 70%. Wall   motion was normal; there were no regional wall motion   abnormalities. Features are consistent with a pseudonormal left   ventricular filling pattern, with concomitant abnormal relaxation   and increased filling pressure (grade 2 diastolic dysfunction).   Doppler parameters are consistent with high ventricular filling   pressure. - Aortic valve: There was mild stenosis. There was no   regurgitation. Peak velocity (S): 269 cm/s. Mean gradient (S): 16   mm Hg. - Mitral valve: Transvalvular velocity was within the normal range.   There was no evidence for stenosis. There was mild regurgitation. - Left atrium: The atrium was moderately dilated. - Right ventricle: The cavity size was normal.  Wall thickness was   normal. Systolic  function was normal. - Atrial septum: No defect or patent foramen ovale was identified. - Tricuspid valve: There was mild regurgitation. Peak RV-RA   gradient (S): 47 mm Hg. - Pulmonary arteries: Systolic pressure was moderately increased.   PA peak pressure: 50 mm Hg (S). - Pericardium, extracardiac: A small pericardial effusion was   identified.  Cardiac catheterization 06/21/2017  Prox RCA to Mid RCA lesion is 30% stenosed.  Mid RCA lesion is 40% stenosed.  Dist RCA lesion is 30% stenosed.  Ost LAD to Prox LAD lesion is 100% stenosed.  LIMA graft was visualized by angiography and is normal in caliber.  Ost Cx to Prox Cx lesion is 90% stenosed.  Hemodynamic findings consistent with mild pulmonary hypertension.  LV end diastolic pressure is normal.   1. Severe double vessel CAD s/p 3V CABG with 3/3 patent bypass grafts  2. The LAD has 100% proximal occlusion. The entire LAD fills from the patent LIMA graft. The Diagonal branch fills from the vein graft 3. The Circumflex has 90% proximal stenosis. The vein graft fills the large obtuse marginal graft.  4. The RCA is a large dominant vessel with mild diffuse calcific plaque in the proximal, mid and distal vessel but no obstructive lesions.  5. The LIMA to the LAD is patent 6 The sequential saphenous vein graft to the Diagonal and OM is patent with mild filling defects seen in the proximal, mid and distal segments of the body of the graft. These lesions do not appear to be flow limiting.  7. Mild aortic stenosis. Peak to peak gradient 4 mmHg. The valve was easily crossed.  8. Right and left heart filling pressures outline above.   Recommendations: Continue medical management of CAD. Continue aggressive blood pressure control and diuresis for diastolic CHF. Will hydrate for 6 hours post cath and plan discharge home later today.   Assessment & Plan   1.  Chronic diastolic congestive heart  failure-weight today 171 pounds only 1 pound difference between hospital discharge dry weight of 170 pounds from June 2019. Continue carvedilol 3.125 mg twice daily Continue furosemide 40 mg tablet twice daily Heart healthy low-sodium diet  2.  Essential hypertension-BP today 172/72.  Has not taken his blood pressure medication yet this morning.  Well controlled at home. Heart healthy low-sodium diet Maintain physical activity  Heart healthy low-sodium diet Maintain blood pressure log and bring to next appointment Continue amlodipine 10 mg at bedtime Continue carvedilol 3.125 mg twice daily Continue furosemide 40 mg tablet twice daily Continue hydralazine 100 mg 3 times daily  3.  Hyperlipidemia Heart healthy low-sodium diet Maintain physical activity  Continue atorvastatin 40 mg tablet daily Monitored by PCP  4.  Coronary artery disease-cardiac catheterization on 06/21/2017 showed two-vessel coronary artery disease with 3 out of 3 patent bypass grafts.  Medical therapy was recommended at that time and his symptoms were thought to be caused by fluid volume overload.  Continue aspirin 81 mg tablet daily Atorvastatin 40 mg tablet daily Heart healthy low-sodium diet Maintain physical activity   Disposition: Follow-up with [name] in 6 months.  Deberah Pelton, NP-C 11/01/2018, 10:23 AM

## 2018-11-01 NOTE — Patient Instructions (Signed)
Medication Instructions:  Your physician recommends that you continue on your current medications as directed. Please refer to the Current Medication list given to you today.  If you need a refill on your cardiac medications before your next appointment, please call your pharmacy.    Follow-Up: At [facility], you and your health needs are our priority.  As part of our continuing mission to provide you with exceptional heart care, we have created designated Provider Care Teams.  These Care Teams include your primary Cardiologist (physician) and Advanced Practice Providers (APPs -  Physician Assistants and Nurse Practitioners) who all work together to provide you with the care you need, when you need it. You will need a follow up appointment in 6 months.  Please call our office 2 months in advance to schedule this appointment.  You may see Pixie Casino, MD or one of the following Advanced Practice Providers on your designated Care Team: [place], [place] . Fabian Sharp, PA-C  Any Other Special Instructions Will Be Listed Below (If Applicable). Your physician has requested that you regularly monitor and record your blood pressure readings at home. Please use the same machine at the same time of day to check your readings and record them to bring to your follow-up visit.

## 2018-11-07 ENCOUNTER — Other Ambulatory Visit: Payer: Self-pay | Admitting: *Deleted

## 2018-11-07 NOTE — Patient Outreach (Signed)
[place] [facility]) Care Management  11/07/2018  [name] 1933-08-28 [id number]  RN Health Coach attempted follow up outreach call to patient.  Patient was unavailable. HIPPA compliance voicemail message left with return callback number.  Plan: RN will call patient again within 30 days.  [place] Care Management [phone]

## 2018-11-08 DIAGNOSIS — E1122 Type 2 diabetes mellitus with diabetic chronic kidney disease: Secondary | ICD-10-CM | POA: Diagnosis not present

## 2018-11-08 DIAGNOSIS — I5032 Chronic diastolic (congestive) heart failure: Secondary | ICD-10-CM | POA: Diagnosis not present

## 2018-11-08 DIAGNOSIS — N2581 Secondary hyperparathyroidism of renal origin: Secondary | ICD-10-CM | POA: Diagnosis not present

## 2018-11-08 DIAGNOSIS — N189 Chronic kidney disease, unspecified: Secondary | ICD-10-CM | POA: Diagnosis not present

## 2018-11-08 DIAGNOSIS — I120 Hypertensive chronic kidney disease with stage 5 chronic kidney disease or end stage renal disease: Secondary | ICD-10-CM | POA: Diagnosis not present

## 2018-11-08 DIAGNOSIS — D631 Anemia in chronic kidney disease: Secondary | ICD-10-CM | POA: Diagnosis not present

## 2018-11-08 DIAGNOSIS — N185 Chronic kidney disease, stage 5: Secondary | ICD-10-CM | POA: Diagnosis not present

## 2018-11-11 ENCOUNTER — Other Ambulatory Visit: Payer: Self-pay | Admitting: *Deleted

## 2018-11-11 NOTE — Patient Outreach (Signed)
[place] [facility]) Care Management  11/11/2018   [name] 04/26/1933 [id number]  RN Health Coach received a return telephone call from patient.  Hipaa compliance verified. Per patient he is doing good with no swelling in the lower extremities. Patient stated that he has some shortness of breath with moderate activity. Patient is checking daily weights, blood pressure and blood sugar. Per patient he has spoken with the nephrologist and have discussed possible need for dialysis. Patient has been adhering to diet. Patient has agreed to follow up outreach calls.  Current Medications:  Current Outpatient Medications  Medication Sig Dispense Refill  . amLODipine (NORVASC) 10 MG tablet Take 10 mg by mouth at bedtime.    Marland Kitchen aspirin EC 81 MG tablet Take 81 mg by mouth daily.    Marland Kitchen atorvastatin (LIPITOR) 40 MG tablet Take 40 mg by mouth at bedtime.     . carboxymethylcellulose (REFRESH PLUS) 0.5 % SOLN Place 1 drop into both eyes 3 (three) times daily as needed (dry, itch).    . carvedilol (COREG) 3.125 MG tablet Take 1 tablet (3.125 mg total) by mouth 2 (two) times daily with a meal. 008 tablet 3  . folic acid (FOLVITE) 676 MCG tablet Take 800 mcg by mouth daily.    . furosemide (LASIX) 40 MG tablet Take 40 mg by mouth. 80 mg in the morning and 40 mg in the evening.    Marland Kitchen glimepiride (AMARYL) 4 MG tablet Take 4 mg by mouth daily with breakfast.     . hydrALAZINE (APRESOLINE) 100 MG tablet Take 100 mg by mouth 3 (three) times daily.    . Multiple Vitamin (MULTIVITAMIN) tablet Take 1 tablet by mouth daily.     . Multiple Vitamins-Minerals (MULTIVITAMIN ADULTS PO) Take 1 tablet by mouth every morning.    Marland Kitchen OVER THE COUNTER MEDICATION Place 1 spray into the nose daily as needed. Sovereign Silver Nasal Spray for Immune Support    . OVER THE COUNTER MEDICATION Take 1 capsule by mouth every morning.    . TRADJENTA 5 MG TABS tablet Take 5 mg by mouth every morning.     . traZODone (DESYREL) 50  MG tablet Take 50 mg by mouth at bedtime as needed.   12   No current facility-administered medications for this visit.     Functional Status:  In your present state of health, do you have any difficulty performing the following activities: 11/11/2018 04/16/2018  Hearing? N N  Vision? N N  Difficulty concentrating or making decisions? N N  Walking or climbing stairs? Y Y  Comment - Patient uses a cane when he goes out and a walker in the house. He has some dizziness when he stands  Dressing or bathing? N N  Doing errands, shopping? N N  Preparing Food and eating ? N N  Using the Toilet? N N  In the past six months, have you accidently leaked urine? Y Y  Comment - When on higher dosage of lasix  Do you have problems with loss of bowel control? N N  Managing your Medications? N N  Managing your Finances? N N  Housekeeping or managing your Housekeeping? [name]  Comment wife wife does this  Some recent data might be hidden    Fall/Depression Screening: Fall Risk  11/11/2018 08/13/2018 04/16/2018  Falls in the past year? 0 0 0  Number falls in past yr: 0 - -  Injury with Fall? 0 - -  Risk for fall  due to : Impaired balance/gait;Impaired vision;Impaired mobility - Impaired balance/gait;Impaired mobility;Impaired vision  Follow up Falls evaluation completed - Falls evaluation completed;Falls prevention discussed   PHQ 2/9 Scores 11/11/2018 08/13/2018 04/16/2018 10/01/2017 08/28/2017  PHQ - 2 Score 0 0 0 0 0   THN CM Care Plan Problem One     Most Recent Value  Role Documenting the Problem One  [place] for Problem One  Active  THN Long Term Goal   Patient will follow up on maintenance care within the next 90 days  Interventions for Problem One Long Term Goal  RN reiterated getting flu shot and follow up eye exam. RN will follow up with further discussion and monitor compliance.     Assessment:  Patient is checking blood pressure, daily weights and blood sugar Patient is taking  medications as prescribed Patient is adhering to diet Plan:  Patient has eye exam 11/12/2018 Patient will schedule flu shot  Patient will follow up with nephrologist about dialysis treatments home vs dialysis center RN sent [facility] product catalog RN sent Clinical Key education on Food Basics for Chronic Kidney Disease RN will follow up within the month of December

## 2018-11-12 DIAGNOSIS — E113412 Type 2 diabetes mellitus with severe nonproliferative diabetic retinopathy with macular edema, left eye: Secondary | ICD-10-CM | POA: Diagnosis not present

## 2018-11-22 DIAGNOSIS — I129 Hypertensive chronic kidney disease with stage 1 through stage 4 chronic kidney disease, or unspecified chronic kidney disease: Secondary | ICD-10-CM | POA: Diagnosis not present

## 2018-11-28 ENCOUNTER — Encounter: Payer: Self-pay | Admitting: General Practice

## 2018-11-28 DIAGNOSIS — R739 Hyperglycemia, unspecified: Secondary | ICD-10-CM | POA: Diagnosis not present

## 2018-11-28 DIAGNOSIS — E785 Hyperlipidemia, unspecified: Secondary | ICD-10-CM | POA: Diagnosis not present

## 2018-11-28 DIAGNOSIS — I10 Essential (primary) hypertension: Secondary | ICD-10-CM | POA: Diagnosis not present

## 2018-11-29 DIAGNOSIS — B029 Zoster without complications: Secondary | ICD-10-CM | POA: Diagnosis not present

## 2018-11-29 DIAGNOSIS — N185 Chronic kidney disease, stage 5: Secondary | ICD-10-CM | POA: Diagnosis not present

## 2018-11-29 DIAGNOSIS — I120 Hypertensive chronic kidney disease with stage 5 chronic kidney disease or end stage renal disease: Secondary | ICD-10-CM | POA: Diagnosis not present

## 2018-11-29 DIAGNOSIS — D631 Anemia in chronic kidney disease: Secondary | ICD-10-CM | POA: Diagnosis not present

## 2018-11-29 DIAGNOSIS — N2581 Secondary hyperparathyroidism of renal origin: Secondary | ICD-10-CM | POA: Diagnosis not present

## 2018-12-05 DIAGNOSIS — E785 Hyperlipidemia, unspecified: Secondary | ICD-10-CM | POA: Diagnosis not present

## 2018-12-05 DIAGNOSIS — N184 Chronic kidney disease, stage 4 (severe): Secondary | ICD-10-CM | POA: Diagnosis not present

## 2018-12-05 DIAGNOSIS — E1122 Type 2 diabetes mellitus with diabetic chronic kidney disease: Secondary | ICD-10-CM | POA: Diagnosis not present

## 2018-12-05 DIAGNOSIS — I10 Essential (primary) hypertension: Secondary | ICD-10-CM | POA: Diagnosis not present

## 2018-12-05 DIAGNOSIS — I2510 Atherosclerotic heart disease of native coronary artery without angina pectoris: Secondary | ICD-10-CM | POA: Diagnosis not present

## 2018-12-23 ENCOUNTER — Other Ambulatory Visit: Payer: Self-pay | Admitting: *Deleted

## 2018-12-23 NOTE — Patient Outreach (Signed)
[place] [facility]) Care Management  12/23/2018  [name] 02-10-1934 [id number]   RN Health Coach received telephone call from patient and wife  Hipaa compliance verified. They are wanting a  New blood pressure cuff and wanted to know which one is the best. RN discussed the blood pressure cuff in there [facility] catalog and page number if they have enough in the product account or [facility] or [employer] and get one. RN also pointed out the forehead thermometer since COVID. Patient asked for foods good for circulation.   Plan:  RN sent the patient a list of foods good for circulation.   [place] Care Management [phone]

## 2018-12-24 DIAGNOSIS — E113412 Type 2 diabetes mellitus with severe nonproliferative diabetic retinopathy with macular edema, left eye: Secondary | ICD-10-CM | POA: Diagnosis not present

## 2018-12-31 DIAGNOSIS — G8929 Other chronic pain: Secondary | ICD-10-CM | POA: Diagnosis not present

## 2018-12-31 DIAGNOSIS — M545 Low back pain: Secondary | ICD-10-CM | POA: Diagnosis not present

## 2018-12-31 DIAGNOSIS — M1712 Unilateral primary osteoarthritis, left knee: Secondary | ICD-10-CM | POA: Diagnosis not present

## 2018-12-31 DIAGNOSIS — N184 Chronic kidney disease, stage 4 (severe): Secondary | ICD-10-CM | POA: Diagnosis not present

## 2018-12-31 DIAGNOSIS — M059 Rheumatoid arthritis with rheumatoid factor, unspecified: Secondary | ICD-10-CM | POA: Diagnosis not present

## 2019-01-01 DIAGNOSIS — N2581 Secondary hyperparathyroidism of renal origin: Secondary | ICD-10-CM | POA: Diagnosis not present

## 2019-01-01 DIAGNOSIS — N185 Chronic kidney disease, stage 5: Secondary | ICD-10-CM | POA: Diagnosis not present

## 2019-01-01 DIAGNOSIS — D631 Anemia in chronic kidney disease: Secondary | ICD-10-CM | POA: Diagnosis not present

## 2019-01-01 DIAGNOSIS — N183 Chronic kidney disease, stage 3 unspecified: Secondary | ICD-10-CM | POA: Diagnosis not present

## 2019-01-01 DIAGNOSIS — E118 Type 2 diabetes mellitus with unspecified complications: Secondary | ICD-10-CM | POA: Diagnosis not present

## 2019-01-01 DIAGNOSIS — I2510 Atherosclerotic heart disease of native coronary artery without angina pectoris: Secondary | ICD-10-CM | POA: Diagnosis not present

## 2019-01-01 DIAGNOSIS — I350 Nonrheumatic aortic (valve) stenosis: Secondary | ICD-10-CM | POA: Diagnosis not present

## 2019-01-01 DIAGNOSIS — E119 Type 2 diabetes mellitus without complications: Secondary | ICD-10-CM | POA: Diagnosis not present

## 2019-01-01 DIAGNOSIS — I120 Hypertensive chronic kidney disease with stage 5 chronic kidney disease or end stage renal disease: Secondary | ICD-10-CM | POA: Diagnosis not present

## 2019-01-01 DIAGNOSIS — E1122 Type 2 diabetes mellitus with diabetic chronic kidney disease: Secondary | ICD-10-CM | POA: Diagnosis not present

## 2019-01-13 DIAGNOSIS — I739 Peripheral vascular disease, unspecified: Secondary | ICD-10-CM | POA: Diagnosis not present

## 2019-01-13 DIAGNOSIS — E119 Type 2 diabetes mellitus without complications: Secondary | ICD-10-CM | POA: Diagnosis not present

## 2019-01-28 ENCOUNTER — Other Ambulatory Visit: Payer: Self-pay | Admitting: Internal Medicine

## 2019-01-30 ENCOUNTER — Telehealth: Payer: Self-pay | Admitting: Internal Medicine

## 2019-01-30 NOTE — Telephone Encounter (Signed)
I spoke with pt. He reports having some dizziness when he had office visit in September but he feels it is getting worse. Dizziness occurs when standing up and bending over. Also occurs when walking.  He has to use a walker to help with his balance. No falls.  Has been using the walker since discharge from the hospital in July.  Not on dialysis yet but pt reports this is being planned for the near future. Reports the following BP/pulse readings 146/67,54 170/68,58 156/72,66 148/63,59 No other new changes or complaints. Pt is asking if dizziness could be heart related.  Will forward to Dr Debara Pickett for review

## 2019-01-30 NOTE — Telephone Encounter (Signed)
Patient would like someone to go over his list of heart medications with him. He does not think he has any but states he has been feeling dizzy. Saw Coletta Memos on 11/01/18 and is due for his 38mos f/u in March.

## 2019-01-31 NOTE — Telephone Encounter (Signed)
BP not low - could be orthostatic . Would need office testing of BP to see if this is an issue. Could see APP for this.  Dr Lemmie Evens

## 2019-01-31 NOTE — Telephone Encounter (Signed)
I spoke with pt and gave him information from Dr Debara Pickett. He would like to hold off on scheduling an appointment at this time.  He will call to schedule if dizziness worsens.  He is due for follow up in March with Dr Debara Pickett so I scheduled this for March 8,2021 at 9:00

## 2019-02-10 ENCOUNTER — Other Ambulatory Visit: Payer: Self-pay | Admitting: *Deleted

## 2019-02-10 NOTE — Patient Outreach (Signed)
[place] [facility]) Care Management  [place]  02/10/2019   [name] [birth year] [id number]  RN Health Coach telephone call to patient.  [employer] compliance verified. Per patient he is doing fair. He has a positive outlook with the knowledge that he will be receiving peritoneal dialysis within the month. Per patient he is doing daily weights, CBG and blood pressures. He is taking all his medication. Per patient he is riding his exercise bike .Patient has agreed to further outreach calls.    Encounter Medications:  Outpatient Encounter Medications as of 02/10/2019  Medication Sig  . amLODipine (NORVASC) 10 MG tablet Take 10 mg by mouth at bedtime.  Marland Kitchen aspirin EC 81 MG tablet Take 81 mg by mouth daily.  Marland Kitchen atorvastatin (LIPITOR) 40 MG tablet Take 40 mg by mouth at bedtime.   . carboxymethylcellulose (REFRESH PLUS) 0.5 % SOLN Place 1 drop into both eyes 3 (three) times daily as needed (dry, itch).  . carvedilol (COREG) 3.125 MG tablet TAKE 1 TABLET (3.125 MG TOTAL) BY MOUTH 2 (TWO) TIMES DAILY WITH A MEAL.  . folic acid (FOLVITE) 341 MCG tablet Take 800 mcg by mouth daily.  . furosemide (LASIX) 40 MG tablet Take 40 mg by mouth. 80 mg in the morning and 40 mg in the evening.  Marland Kitchen glimepiride (AMARYL) 4 MG tablet Take 4 mg by mouth daily with breakfast.   . hydrALAZINE (APRESOLINE) 100 MG tablet Take 100 mg by mouth 3 (three) times daily.  . Multiple Vitamin (MULTIVITAMIN) tablet Take 1 tablet by mouth daily.   . Multiple Vitamins-Minerals (MULTIVITAMIN ADULTS PO) Take 1 tablet by mouth every morning.  Marland Kitchen OVER THE COUNTER MEDICATION Place 1 spray into the nose daily as needed. Sovereign Silver Nasal Spray for Immune Support  . OVER THE COUNTER MEDICATION Take 1 capsule by mouth every morning.  . TRADJENTA 5 MG TABS tablet Take 5 mg by mouth every morning.   . traZODone (DESYREL) 50 MG tablet Take 50 mg by mouth at bedtime as needed.    No facility-administered encounter  medications on file as of 02/10/2019.    Functional Status:  In your present state of health, do you have any difficulty performing the following activities: 02/10/2019 11/11/2018  Hearing? N N  Vision? N N  Difficulty concentrating or making decisions? N N  Walking or climbing stairs? Y Y  Comment uses walker -  Dressing or bathing? N N  Doing errands, shopping? N N  Preparing Food and eating ? N N  Using the Toilet? N N  In the past six months, have you accidently leaked urine? Y Y  Comment - -  Do you have problems with loss of bowel control? N N  Managing your Medications? N N  Managing your Finances? N N  Housekeeping or managing your Housekeeping? [name] wife handles housekeeping wife  Some recent data might be hidden    Fall/Depression Screening: Fall Risk  02/10/2019 11/11/2018 08/13/2018  Falls in the past year? 0 0 0  Number falls in past yr: 0 0 -  Injury with Fall? 0 0 -  Risk for fall due to : Impaired balance/gait;Impaired mobility Impaired balance/gait;Impaired vision;Impaired mobility -  Follow up Falls evaluation completed Falls evaluation completed -   PHQ 2/9 Scores 02/10/2019 11/11/2018 08/13/2018 04/16/2018 10/01/2017 08/28/2017  PHQ - 2 Score 0 0 0 0 0 0   THN CM Care Plan Problem One     Most Recent  Value  Care Plan Problem One  Knowledge Deficit in Self Management of Congestive Heart Failure  Role Documenting the Problem One  [place] for Problem One  Active  THN Long Term Goal   Patient will follow up on maintenance care within the next 90 days  THN Long Term Goal Start Date  02/10/19  Interventions for Problem One Long Term Goal  RN reiterated following up with health maintenance. Patient is checking when he can get the COVID vaccine. Patient will be starting peritoneal dialysis. Patient is wanting a watch to mointor his blood pressure and vitals signs while exercising and furture dialysis. RN will follow up with further discussion  to meet patient needs.  THN CM Short Term Goal #2   Patient will verbalize receiving information on Hypertension within the next 30 days  THN CM Short Term Goal #2 Met Date  02/10/19  [facility] CM Short Term Goal #3  Patient will verbalize eating low sodium heart healthy diet within the next 30 days  THN CM Short Term Goal #3 Met Date  02/10/19  [facility] CM Short Term Goal #4  Patient will have a better undersatnding of A1C and fasting blood sugar within the next 30 days  THN CM Short Term Goal #4 Start Date  02/10/19      Assessment:  Patient is doing daily weights, CBG, bp Patient is to start peritoneal dialysis Patient is monitoring diet closely  Plan:  RN discussed maintaining diet RN discussed continuing monitoring weigh , CBG and BP daily RN discussed health maintenance RN sent a 2021 Calendar book RN will follow up outreach within the month of February  Ailine Hefferan [place] Management [phone]

## 2019-02-12 DIAGNOSIS — E1122 Type 2 diabetes mellitus with diabetic chronic kidney disease: Secondary | ICD-10-CM | POA: Diagnosis not present

## 2019-02-12 DIAGNOSIS — N189 Chronic kidney disease, unspecified: Secondary | ICD-10-CM | POA: Diagnosis not present

## 2019-02-12 DIAGNOSIS — D631 Anemia in chronic kidney disease: Secondary | ICD-10-CM | POA: Diagnosis not present

## 2019-02-12 DIAGNOSIS — N185 Chronic kidney disease, stage 5: Secondary | ICD-10-CM | POA: Diagnosis not present

## 2019-02-12 DIAGNOSIS — I120 Hypertensive chronic kidney disease with stage 5 chronic kidney disease or end stage renal disease: Secondary | ICD-10-CM | POA: Diagnosis not present

## 2019-02-12 DIAGNOSIS — N2581 Secondary hyperparathyroidism of renal origin: Secondary | ICD-10-CM | POA: Diagnosis not present

## 2019-02-14 ENCOUNTER — Telehealth (HOSPITAL_COMMUNITY): Payer: Self-pay | Admitting: *Deleted

## 2019-02-14 NOTE — Telephone Encounter (Signed)

## 2019-02-16 ENCOUNTER — Other Ambulatory Visit: Payer: Self-pay

## 2019-02-16 DIAGNOSIS — N184 Chronic kidney disease, stage 4 (severe): Secondary | ICD-10-CM

## 2019-02-17 ENCOUNTER — Ambulatory Visit (INDEPENDENT_AMBULATORY_CARE_PROVIDER_SITE_OTHER)
Admission: RE | Admit: 2019-02-17 | Discharge: 2019-02-17 | Disposition: A | Discharge status: Home / Self Care | Payer: Medicare Other | Source: Ambulatory Visit | Attending: Family | Admitting: Family

## 2019-02-17 ENCOUNTER — Other Ambulatory Visit: Payer: Self-pay

## 2019-02-17 ENCOUNTER — Encounter: Payer: Self-pay | Admitting: Surgery

## 2019-02-17 ENCOUNTER — Ambulatory Visit (INDEPENDENT_AMBULATORY_CARE_PROVIDER_SITE_OTHER): Payer: Medicare Other | Admitting: Surgery

## 2019-02-17 ENCOUNTER — Ambulatory Visit (HOSPITAL_COMMUNITY)
Admission: RE | Admit: 2019-02-17 | Discharge: 2019-02-17 | Disposition: A | Discharge status: Home / Self Care | Payer: Medicare Other | Source: Ambulatory Visit | Attending: Family | Admitting: Family

## 2019-02-17 VITALS — BP 195/74 | HR 60 | Temp 97.7°F | Resp 20 | Ht 70.5 in | Wt 164.3 lb

## 2019-02-17 DIAGNOSIS — N185 Chronic kidney disease, stage 5: Secondary | ICD-10-CM | POA: Diagnosis not present

## 2019-02-17 DIAGNOSIS — N184 Chronic kidney disease, stage 4 (severe): Secondary | ICD-10-CM | POA: Insufficient documentation

## 2019-02-17 NOTE — Progress Notes (Signed)
[facility]ist of [facility]  Patient name: [name] MRN: [id number] DOB: 10/22/1933 Sex: male   REQUESTING PROVIDER:    Dr. Hollie Salk   REASON FOR CONSULT:    Dialysis access  HISTORY OF PRESENT ILLNESS:   [name] is a 85 y.o. male, who is referred today for dialysis access placement.  The patient is renal failure secondary to diabetes and hypertension.  He suffers from anemia due to chronic renal failure.  He has secondary hyperparathyroidism.  He takes a statin for hypercholesterolemia.  PAST MEDICAL HISTORY    Past Medical History:  Diagnosis Date  . Arthritis    "a little in LLE" (06/20/2017)  . Chronic diastolic CHF (congestive heart failure) ([place])   . CKD (chronic kidney disease), stage IV ([place])   . Coronary artery disease    a. s/p CABG 1980s. b. stable by cath 05/2017.  Marland Kitchen Dyslipidemia   . Edema 08/08/2017   HANDS & LOWER EXTREMITES  . Hypertension   . Hypoalbuminemia   . Nephrotic range proteinuria   . Peripheral artery disease ([facility])    mild  . Pneumonia ~ 1950 X 1  . Prostate cancer ([place]) ~ 2015  . Type II diabetes mellitus ([place])      FAMILY HISTORY   Family History  Problem Relation Age of Onset  . Heart attack Mother     SOCIAL HISTORY:   Social History   Socioeconomic History  . Marital status: Married    Spouse name: Not on file  . Number of children: Not on file  . Years of education: Not on file  . Highest education level: Not on file  Occupational History  . Not on file  Tobacco Use  . Smoking status: Former Smoker    Packs/day: 1.00    Years: 20.00    Pack years: 20.00    Types: Cigarettes    Quit date: 08/22/1972    Years since quitting: 46.5  . Smokeless tobacco: Never Used  Substance and Sexual Activity  . Alcohol use: Not Currently    Comment: 06/20/2017 "nothing since ~ 1974"  . Drug use: No  . Sexual activity: Not Currently  Other Topics Concern  . Not on file    Social History Narrative  . Not on file   Social Determinants of Health   Financial Resource Strain:   . Difficulty of Paying Living Expenses: Not on file  Food Insecurity:   . Worried About Charity fundraiser in the Last Year: Not on file  . Ran Out of Food in the Last Year: Not on file  Transportation Needs:   . Lack of Transportation (Medical): Not on file  . Lack of Transportation (Non-Medical): Not on file  Physical Activity:   . Days of Exercise per Week: Not on file  . Minutes of Exercise per Session: Not on file  Stress:   . Feeling of Stress : Not on file  Social Connections:   . Frequency of Communication with Friends and Family: Not on file  . Frequency of Social Gatherings with Friends and Family: Not on file  . Attends Religious Services: Not on file  . Active Member of Clubs or Organizations: Not on file  . Attends Archivist Meetings: Not on file  . Marital Status: Not on file  Intimate Partner Violence:   . Fear of Current or Ex-Partner: Not on file  . Emotionally Abused: Not on file  . Physically Abused: Not on  file  . Sexually Abused: Not on file    ALLERGIES:    No Known Allergies  CURRENT MEDICATIONS:    Current Outpatient Medications  Medication Sig Dispense Refill  . amLODipine (NORVASC) 10 MG tablet Take 10 mg by mouth at bedtime.    Marland Kitchen aspirin EC 81 MG tablet Take 81 mg by mouth daily.    Marland Kitchen atorvastatin (LIPITOR) 40 MG tablet Take 40 mg by mouth at bedtime.     . carboxymethylcellulose (REFRESH PLUS) 0.5 % SOLN Place 1 drop into both eyes 3 (three) times daily as needed (dry, itch).    . carvedilol (COREG) 3.125 MG tablet TAKE 1 TABLET (3.125 MG TOTAL) BY MOUTH 2 (TWO) TIMES DAILY WITH A MEAL. 341 tablet 0  . folic acid (FOLVITE) 962 MCG tablet Take 800 mcg by mouth daily.    . furosemide (LASIX) 40 MG tablet Take 40 mg by mouth. 80 mg in the morning and 40 mg in the evening.    Marland Kitchen glimepiride (AMARYL) 4 MG tablet Take 4 mg by mouth  daily with breakfast.     . hydrALAZINE (APRESOLINE) 100 MG tablet Take 100 mg by mouth 3 (three) times daily.    . Multiple Vitamin (MULTIVITAMIN) tablet Take 1 tablet by mouth daily.     . Multiple Vitamins-Minerals (MULTIVITAMIN ADULTS PO) Take 1 tablet by mouth every morning.    Marland Kitchen OVER THE COUNTER MEDICATION Place 1 spray into the nose daily as needed. Sovereign Silver Nasal Spray for Immune Support    . OVER THE COUNTER MEDICATION Take 1 capsule by mouth every morning.    . TRADJENTA 5 MG TABS tablet Take 5 mg by mouth every morning.     . traZODone (DESYREL) 50 MG tablet Take 50 mg by mouth at bedtime as needed.   12   No current facility-administered medications for this visit.    REVIEW OF SYSTEMS:   [X]  denotes positive finding, [ ]  denotes negative finding Cardiac  Comments:  Chest pain or chest pressure:    Shortness of breath upon exertion: x   Short of breath when lying flat:    Irregular heart rhythm:        Vascular    Pain in calf, thigh, or hip brought on by ambulation:    Pain in feet at night that wakes you up from your sleep:     Blood clot in your veins:    Leg swelling:         Pulmonary    Oxygen at home:    Productive cough:     Wheezing:         Neurologic    Sudden weakness in arms or legs:     Sudden numbness in arms or legs:     Sudden onset of difficulty speaking or slurred speech:    Temporary loss of vision in one eye:     Problems with dizziness:         Gastrointestinal    Blood in stool:      Vomited blood:         Genitourinary    Burning when urinating:     Blood in urine:        Psychiatric    Major depression:         Hematologic    Bleeding problems:    Problems with blood clotting too easily:        Skin    Rashes or ulcers:  Constitutional    Fever or chills:     PHYSICAL EXAM:   Vitals:   02/17/19 1148  BP: (!) 195/74  Pulse: 60  Resp: 20  Temp: 97.7 F (36.5 C)  SpO2: 98%  Weight: 164 lb 4.8 oz  (74.5 kg)  Height: 5' 10.5" (1.791 m)    GENERAL: The patient is a well-nourished male, in no acute distress. The vital signs are documented above. CARDIAC: There is a regular rate and rhythm.  PULMONARY: Nonlabored respirations MUSCULOSKELETAL: There are no major deformities or cyanosis. NEUROLOGIC: No focal weakness or paresthesias are detected. SKIN: There are no ulcers or rashes noted. PSYCHIATRIC: The patient has a normal affect.  STUDIES:   I have reviewed the following: Left Pre-Dialysis Findings: +------------------+---------+-------------------+---------+-------------------+ Location          PSV      Intralum. Diam.    Waveform Comments                              (cm/s)   (cm)                                            +------------------+---------+-------------------+---------+-------------------+ Brachial Antecub. 85       0.57               triphasicbifurcates just     fossa                                                  below the ACF       +------------------+---------+-------------------+---------+-------------------+ Radial Art at     74       0.22               triphasic                    Wrist                                                                      +------------------+---------+-------------------+---------+-------------------+ Ulnar Art at Wrist77       0.19               triphasic                    +------------------+---------+-------------------+---------+-------------------+ Summary:   Right: Brachial, radial, and ulnar arteries are patent. Left: Brachial, radial, and ulnar arteries are patent.  +-----------------+-------------+----------+----------------+ Right Cephalic   Diameter (cm)Depth (cm)    Findings     +-----------------+-------------+----------+----------------+ Prox upper arm       0.08                                 +-----------------+-------------+----------+----------------+ Mid upper arm                           chronic thrombus +-----------------+-------------+----------+----------------+ Antecubital fossa  0.07               thickened walls  +-----------------+-------------+----------+----------------+ Prox forearm         0.10               thickened walls  +-----------------+-------------+----------+----------------+ Mid forearm          0.11               thickened walls  +-----------------+-------------+----------+----------------+ Dist forearm         0.07               thickened walls  +-----------------+-------------+----------+----------------+  +-----------------+-------------+----------+--------+ Right Basilic    Diameter (cm)Depth (cm)Findings +-----------------+-------------+----------+--------+ Mid upper arm        0.22                        +-----------------+-------------+----------+--------+ Dist upper arm       0.19                        +-----------------+-------------+----------+--------+ Antecubital fossa    0.16                        +-----------------+-------------+----------+--------+  +-----------------+-------------+----------+---------------+ Left Cephalic    Diameter (cm)Depth (cm)   Findings     +-----------------+-------------+----------+---------------+ Prox upper arm       0.06                               +-----------------+-------------+----------+---------------+ Mid upper arm        0.09                               +-----------------+-------------+----------+---------------+ Dist upper arm       0.04                               +-----------------+-------------+----------+---------------+ Antecubital fossa                       not visualized  +-----------------+-------------+----------+---------------+ Prox forearm         0.15                to basilic v    +-----------------+-------------+----------+---------------+ Mid forearm          0.90               thickened walls +-----------------+-------------+----------+---------------+ Dist forearm         0.05               thickened walls +-----------------+-------------+----------+---------------+  +-----------------+-------------+----------+---------------+ Left Basilic     Diameter (cm)Depth (cm)   Findings     +-----------------+-------------+----------+---------------+ Dist upper arm       0.14                               +-----------------+-------------+----------+---------------+ Antecubital fossa    0.20               from cephalic v +-----------------+-------------+----------+---------------+  Summary: Right: Very small veins with thickened walls. Chronic thrombus noted        in the mid upper arm cephalic vein. Left: Very small veins with thickened walls.   ASSESSMENT and PLAN  CKD 5: The patient has very small surface veins in both arms.  I do not think he is a candidate for fistula creation.  I called over to [facility] and spoke with their coordinator and discussed this.  It sounds like the patient is going to go the route of home peritoneal dialysis, and extremity access was for a backup.  Given that he is not a candidate for fistula, we agreed that placing a graft would not be beneficial at this time.  I discussed this with the patient and told him that his nephrologist would reach out to me if his situation changes.   Leia Alf, MD, FACS [facility] [phone] Pager [phone]

## 2019-02-18 DIAGNOSIS — E113412 Type 2 diabetes mellitus with severe nonproliferative diabetic retinopathy with macular edema, left eye: Secondary | ICD-10-CM | POA: Diagnosis not present

## 2019-02-18 DIAGNOSIS — E113491 Type 2 diabetes mellitus with severe nonproliferative diabetic retinopathy without macular edema, right eye: Secondary | ICD-10-CM | POA: Diagnosis not present

## 2019-03-26 ENCOUNTER — Ambulatory Visit: Payer: Medicare Other

## 2019-03-29 NOTE — Progress Notes (Signed)
Cardiology Office Note   Date:  03/31/2019   ID:  [name], DOB 1933/03/10, MRN [id number]  PCP:  Nathaniel Gravel, MD  Cardiologist:  Nathaniel Casino, MD EP: None  Chief Complaint  Patient presents with  . Pre-op Exam  . Dizziness      History of Present Illness: [name] is a 86 y.o. male with a PMH of CAD s/p CABG in the 1987, HTN, PAD, chronic diastolic CHF, HLD, DM type 2, ESRD not yet on dialysis, and prostate cancer, who presents for preoperative assessment and follow-up of his dizziness.   He was last evaluated by cardiology at an outpatient visit with Nathaniel Memos, NP 10/2018, at which time he was doing well from a cardiac standpoint and was without cardiac complaints. No medication changes occurred and he was recommended to follow up in 6 months. His last ischemic evaluation was a [facility] 05/2017 which showed 3/3 patent bypass grafts. His last echocardiogram 02/2017 showed EF 65-70%, G2DD, no RWMA, mild AS, moderate LAE, and mild TR.   He contacted our office 01/30/2019 with complaints of dizziness when standing up and bending over. Reported BP's were elevated to 140s-170s/60s-70s with HR's in the 50s-60s, and it was felt he might be having orthostatic symtpoms. He was recommended to present for a visit with an APP for further evaluation, however he deferred at that time stating if symptoms worsened he would reconsider being seen prior to his 04/2019 visit with Dr. Debara Pickett.   He presents today for preoperative assessment and follow-up of his dizziness. He reports decline in his physical stamina over the past year, though still makes a point to ride his stationary bike and walk daily. He has chronic DOE which is unchanged in recent months. He can complete 4 METs without anginal complaints. He denies any chest pain. He does have occasional dizziness when changing positions or bending over. Also with moving his head from side to side. He wears compression stocking daily. He denies  any pre-syncope or syncope. No complaints of palpitations, LE edema, orthopnea, or PND.     Past Medical History:  Diagnosis Date  . Arthritis    "a little in LLE" (06/20/2017)  . Chronic diastolic CHF (congestive heart failure) ([place])   . CKD (chronic kidney disease), stage IV ([place])   . Coronary artery disease    a. s/p CABG 1980s. b. stable by cath 05/2017.  Marland Kitchen Dyslipidemia   . Edema 08/08/2017   HANDS & LOWER EXTREMITES  . Hypertension   . Hypoalbuminemia   . Nephrotic range proteinuria   . Peripheral artery disease (HCC)    mild  . Pneumonia ~ 1950 X 1  . Prostate cancer ([place]) ~ 2015  . Type II diabetes mellitus ([place])     Past Surgical History:  Procedure Laterality Date  . CARDIAC CATHETERIZATION     "a couple times" (06/20/2017)  . CATARACT EXTRACTION W/ INTRAOCULAR LENS  IMPLANT, BILATERAL Bilateral   . CORONARY ARTERY BYPASS GRAFT  03/06/1985   LAD and first diagonal/circumflex (sequential saphenous vein graft,cardioplegia  . INSERTION PROSTATE RADIATION SEED  ~ 2015  . NM MYOCAR PERF WALL MOTION  09/12/2006   no ischemia  . RIGHT/LEFT HEART CATH AND CORONARY/GRAFT ANGIOGRAPHY N/A 06/21/2017   Procedure: RIGHT/LEFT HEART CATH AND CORONARY/GRAFT ANGIOGRAPHY;  Surgeon: Burnell Blanks, MD;  Location: [facility];  Service: Cardiovascular;  Laterality: N/A;  . US ECHOCARDIOGRAPHY  11/04/2008   trace TR & MR  Current Outpatient Medications  Medication Sig Dispense Refill  . amLODipine (NORVASC) 10 MG tablet Take 10 mg by mouth at bedtime.    Marland Kitchen aspirin EC 81 MG tablet Take 81 mg by mouth daily.    Marland Kitchen atorvastatin (LIPITOR) 40 MG tablet Take 40 mg by mouth at bedtime.     . carboxymethylcellulose (REFRESH PLUS) 0.5 % SOLN Place 1 drop into both eyes 3 (three) times daily as needed (dry, itch).    . carvedilol (COREG) 3.125 MG tablet TAKE 1 TABLET (3.125 MG TOTAL) BY MOUTH 2 (TWO) TIMES DAILY WITH A MEAL. 160 tablet 0  . folic acid (FOLVITE) 109 MCG tablet  Take 800 mcg by mouth daily.    . furosemide (LASIX) 40 MG tablet Take 40 mg by mouth. 80 mg in the morning and 40 mg in the evening.    Marland Kitchen glimepiride (AMARYL) 4 MG tablet Take 4 mg by mouth daily with breakfast.     . hydrALAZINE (APRESOLINE) 100 MG tablet Take 100 mg by mouth 3 (three) times daily.    . Multiple Vitamin (MULTIVITAMIN) tablet Take 1 tablet by mouth daily.     . Multiple Vitamins-Minerals (MULTIVITAMIN ADULTS PO) Take 1 tablet by mouth every morning.    Marland Kitchen OVER THE COUNTER MEDICATION Place 1 spray into the nose daily as needed. Sovereign Silver Nasal Spray for Immune Support    . OVER THE COUNTER MEDICATION Take 1 capsule by mouth every morning.    . TRADJENTA 5 MG TABS tablet Take 5 mg by mouth every morning.     . traZODone (DESYREL) 50 MG tablet Take 50 mg by mouth at bedtime as needed.   12  . Cholecalciferol 25 MCG (1000 UT) tablet Take by mouth.     No current facility-administered medications for this visit.    Allergies:   Patient has no known allergies.    Social History:  The patient  reports that he quit smoking about 46 years ago. His smoking use included cigarettes. He has a 20.00 pack-year smoking history. He has never used smokeless tobacco. He reports previous alcohol use. He reports that he does not use drugs.   Family History:  The patient's family history includes Heart attack in his mother.    ROS:  Please see the history of present illness.   Otherwise, review of systems are positive for none.   All other systems are reviewed and negative.    PHYSICAL EXAM: VS:  BP (!) 170/77 (BP Location: Right Arm, Patient Position: Standing)   Pulse 63   Temp 97.9 F (36.6 C)   Ht 5\' 11"  (1.803 m)   Wt 165 lb (74.8 kg)   BMI 23.01 kg/m  , BMI Body mass index is 23.01 kg/m. GEN: Well nourished, well developed, in no acute distress HEENT: sclera anicteric Neck: no JVD, carotid bruits, or masses Cardiac: RRR; +murmur, no rubs or gallops, no edema    Respiratory:  clear to auscultation bilaterally, normal work of breathing GI: soft, nontender, nondistended, + BS MS: no deformity or atrophy Skin: warm and dry, no rash Neuro:  Strength and sensation are intact Psych: euthymic mood, full affect   EKG:  EKG is ordered today. The ekg ordered today demonstrates NSR, rate 64 bpm, no STE/D, no TWI, no significant change from previous.    Recent Labs: No results found for requested labs within last 8760 hours.    Lipid Panel No results found for: CHOL, TRIG, HDL, CHOLHDL, VLDL, LDLCALC, LDLDIRECT  Wt Readings from Last 3 Encounters:  03/31/19 165 lb (74.8 kg)  02/17/19 164 lb 4.8 oz (74.5 kg)  11/01/18 171 lb (77.6 kg)      Other studies Reviewed: Additional studies/ records that were reviewed today include:   Right/left heart catheterization 05/2017:  Prox RCA to Mid RCA lesion is 30% stenosed.  Mid RCA lesion is 40% stenosed.  Dist RCA lesion is 30% stenosed.  Ost LAD to Prox LAD lesion is 100% stenosed.  LIMA graft was visualized by angiography and is normal in caliber.  Ost Cx to Prox Cx lesion is 90% stenosed.  Hemodynamic findings consistent with mild pulmonary hypertension.  LV end diastolic pressure is normal.   1. Severe double vessel CAD s/p 3V CABG with 3/3 patent bypass grafts  2. The LAD has 100% proximal occlusion. The entire LAD fills from the patent LIMA graft. The Diagonal branch fills from the vein graft 3. The Circumflex has 90% proximal stenosis. The vein graft fills the large obtuse marginal graft.  4. The RCA is a large dominant vessel with mild diffuse calcific plaque in the proximal, mid and distal vessel but no obstructive lesions.  5. The LIMA to the LAD is patent 6 The sequential saphenous vein graft to the Diagonal and OM is patent with mild filling defects seen in the proximal, mid and distal segments of the body of the graft. These lesions do not appear to be flow limiting.  7. Mild  aortic stenosis. Peak to peak gradient 4 mmHg. The valve was easily crossed.  8. Right and left heart filling pressures outline above.   Recommendations: Continue medical management of CAD. Continue aggressive blood pressure control and diuresis for diastolic CHF. Will hydrate for 6 hours post cath and plan discharge home later today.     ASSESSMENT AND PLAN:  1. Preoperative assessment: he is anticipating starting PD with plans for PD catheter placement soon for management of ESRD. He can complete 4 METs without anginal complaints.  - Based on ACC/AHA guidelines, [name] would be at acceptable risk for the planned procedure without further cardiovascular testing.  - I will route this recommendation to the requesting party via Blue fax function  2. Dizziness: Orthostatic vitals were negative today, though possible he is experiencing this at home. Also possible there is an inner ear component as he notes dizziness with moving his head side to side, as well as tinnitus.  - Advised orthostatic precautions - Continue compression stockings - Consider seeing ENT if symptoms persist  3. CAD s/p CABG in 1987: no anginal complaints. Chronic stable DOE.  - Continue aspirin and statin  4. HTN: BP 170/77 today. Given orthostatic complaints, favor permissive hypertension to minimize symptoms.  - Continue amlodipine, carvedilol, hydralazine, and lasix  5. HLD: LDL 84 05/2018; goal <70 - Will check a FLP today for monitoring - Continue atorvastatin  6. Chronic diastolic CHF: he reports chronic DOE which is unchanged in recent months. No weight changes or LE edema; lungs are clear on exam.  - Continue carvedilol and lasix  7. Aortic stenosis: mild on [facility] 05/2017. No complaints of chest pain or syncope to suggest progression of disease.  - Will update an echocardiogram for routine monitoring. This does not need to delay PD catheter placement.   8. PAD: no claudication complaints - Continue  aspirin and statin  9. ESRD: anticipating starting peritoneal dialysis pending PD catheter placement.  - Continue close follow-up with nephrology  10. DM type  2: A1C 6.3 11/2018 - Continue glimepiride and tradjenta   Current medicines are reviewed at length with the patient today.  The patient does not have concerns regarding medicines.  The following changes have been made:  As above  Labs/ tests ordered today include:   Orders Placed This Encounter  Procedures  . Lipid panel  . EKG 12-Lead  . ECHOCARDIOGRAM COMPLETE     Disposition:   FU with Dr. Debara Pickett in 6 month  Signed, Abigail Butts, PA-C  03/31/2019 5:30 PM

## 2019-03-31 ENCOUNTER — Other Ambulatory Visit: Payer: Self-pay

## 2019-03-31 ENCOUNTER — Ambulatory Visit (INDEPENDENT_AMBULATORY_CARE_PROVIDER_SITE_OTHER): Payer: Medicare Other | Admitting: Medical

## 2019-03-31 ENCOUNTER — Telehealth: Payer: Self-pay

## 2019-03-31 ENCOUNTER — Encounter: Payer: Self-pay | Admitting: Medical

## 2019-03-31 VITALS — BP 170/77 | HR 63 | Temp 97.9°F | Ht 71.0 in | Wt 165.0 lb

## 2019-03-31 DIAGNOSIS — N186 End stage renal disease: Secondary | ICD-10-CM

## 2019-03-31 DIAGNOSIS — I5032 Chronic diastolic (congestive) heart failure: Secondary | ICD-10-CM

## 2019-03-31 DIAGNOSIS — I350 Nonrheumatic aortic (valve) stenosis: Secondary | ICD-10-CM

## 2019-03-31 DIAGNOSIS — R42 Dizziness and giddiness: Secondary | ICD-10-CM | POA: Diagnosis not present

## 2019-03-31 DIAGNOSIS — N185 Chronic kidney disease, stage 5: Secondary | ICD-10-CM

## 2019-03-31 DIAGNOSIS — E1122 Type 2 diabetes mellitus with diabetic chronic kidney disease: Secondary | ICD-10-CM

## 2019-03-31 DIAGNOSIS — I10 Essential (primary) hypertension: Secondary | ICD-10-CM

## 2019-03-31 DIAGNOSIS — Z01810 Encounter for preprocedural cardiovascular examination: Secondary | ICD-10-CM

## 2019-03-31 DIAGNOSIS — I25810 Atherosclerosis of coronary artery bypass graft(s) without angina pectoris: Secondary | ICD-10-CM

## 2019-03-31 DIAGNOSIS — I739 Peripheral vascular disease, unspecified: Secondary | ICD-10-CM

## 2019-03-31 DIAGNOSIS — E785 Hyperlipidemia, unspecified: Secondary | ICD-10-CM

## 2019-03-31 NOTE — Patient Instructions (Signed)
Medication Instructions:  Your physician recommends that you continue on your current medications as directed. Please refer to the Current Medication list given to you today.  *If you need a refill on your cardiac medications before your next appointment, please call your pharmacy*  Lab Work: Your physician recommends that you return for a FASTING lipid profile TODAY:   FLP  If you have labs (blood work) drawn today and your tests are completely normal, you will receive your results only by: Marland Kitchen MyChart Message (if you have MyChart) OR . A paper copy in the mail If you have any lab test that is abnormal or we need to change your treatment, we will call you to review the results.  Testing/Procedures: Your physician has requested that you have an echocardiogram. Echocardiography is a painless test that uses sound waves to create images of your heart. It provides your doctor with information about the size and shape of your heart and how well your heart's chambers and valves are working. This procedure takes approximately one hour. There are no restrictions for this procedure.   PLEASE SCHEDULE FOR 1 MONTH  Follow-Up: At [facility], you and your health needs are our priority.  As part of our continuing mission to provide you with exceptional heart care, we have created designated Provider Care Teams.  These Care Teams include your primary Cardiologist (physician) and Advanced Practice Providers (APPs -  Physician Assistants and Nurse Practitioners) who all work together to provide you with the care you need, when you need it.  Your next appointment:   6 month(s)  The format for your next appointment:   Either In Person or Virtual  Provider:   Raliegh Ip Mali Hilty, MD  Other Instructions

## 2019-03-31 NOTE — Telephone Encounter (Signed)
   [facility] Pre-operative Risk Assessment    Request for surgical clearance:  1. What type of surgery is being performed? LAP PD CATH, OMENTOPEXY, POSSIBLE OPEN   2. When is this surgery scheduled?  04/08/2019   3. What type of clearance is required (medical clearance vs. Pharmacy clearance to hold med vs. Both)? MEDICAL  4. Are there any medications that need to be held prior to surgery and how long? NOT LISTED   5. Practice name and name of physician performing surgery? [facility]; DR. Laurette Schimke TEPPARA   6. What is your office phone number [phone]    7.   What is your office fax number [phone]  8.   Anesthesia type (None, local, MAC, general) ? NOT LISTED   [name] 03/31/2019, 12:29 PM  _________________________________________________________________   (provider comments below)

## 2019-03-31 NOTE — Telephone Encounter (Signed)
Patient has appointment with Roby Lofts, PA-C, this afternoon. I spoke with [place] and she will complete pre-op evaluation at visit. Will route pre-op request form to her and remove from pre-op pool.

## 2019-04-01 LAB — LIPID PANEL
Chol/HDL Ratio: 2.3 ratio (ref 0.0–5.0)
Cholesterol, Total: 196 mg/dL (ref 100–199)
HDL: 87 mg/dL (ref >39)
LDL Chol Calc (NIH): 96 mg/dL (ref 0–99)
Triglycerides: 73 mg/dL (ref 0–149)
VLDL Cholesterol Cal: 13 mg/dL (ref 5–40)

## 2019-04-03 ENCOUNTER — Other Ambulatory Visit: Payer: Self-pay

## 2019-04-03 DIAGNOSIS — Z79899 Other long term (current) drug therapy: Secondary | ICD-10-CM

## 2019-04-03 DIAGNOSIS — E785 Hyperlipidemia, unspecified: Secondary | ICD-10-CM

## 2019-04-03 MED ORDER — ATORVASTATIN CALCIUM 80 MG PO TABS: 40.0000 mg | ORAL_TABLET | Freq: Every day | ORAL | 6 refills | Status: DC

## 2019-04-04 ENCOUNTER — Ambulatory Visit: Payer: Medicare Other | Attending: Internal Medicine

## 2019-04-04 DIAGNOSIS — Z23 Encounter for immunization: Secondary | ICD-10-CM

## 2019-04-04 NOTE — Progress Notes (Signed)
   Covid-19 Vaccination Clinic  Name:  [name]    MRN: [id number] DOB: Jun 02, 1933  04/04/2019  [name] was observed post Covid-19 immunization for 15 minutes without incidence. He was provided with Vaccine Information Sheet and instruction to access the V-Safe system.   [name] was instructed to call 911 with any severe reactions post vaccine: Marland Kitchen Difficulty breathing  . Swelling of your face and throat  . A fast heartbeat  . A bad rash all over your body  . Dizziness and weakness    Immunizations Administered    Name Date Dose VIS Date Route   Pfizer COVID-19 Vaccine 04/04/2019  1:24 PM 0.3 mL 02/07/2019 Intramuscular   Manufacturer: [place]   Lot: [id number]   [place]: [id number]

## 2019-04-15 ENCOUNTER — Other Ambulatory Visit: Payer: Self-pay | Admitting: *Deleted

## 2019-04-15 NOTE — Patient Outreach (Signed)
[facility]) Care Management  04/15/2019  [name] Nov 02, 1933 [id number]   RN Health Coach attempted follow up outreach call to patient.  Patient was unavailable. HIPPA compliance voicemail message left with return callback number.  Plan: RN will call patient again within 30 days.  [place] Care Management [phone]

## 2019-04-15 NOTE — Patient Outreach (Signed)
[facility]) Care Management  [place]  04/15/2019   [name] 11-Dec-1933 [id number]  RN Health Coach received return  telephone call from patient.  Hipaa compliance verified. Per patient he is not feeling very good today. Patient stated he feels so tired like he does not have any energy and it is getting worse each day. Per patient he has a lot of swelling in his legs and hands. Per patient the Dr told him to take extra lasix today to see if it would help. Patient stated he is having some shortness of breath.Patient asked RN health coach what the readings mean from the thing he puts on his finger. Patient read the readings from his pulse oximetry  which was extremely low. Patient read the readings he had been taking for several days. Patient wife took a picture of the readings and sent to the health coach by phone. The highest reading for O2 sat the patient could get was 86%. All the other reading was lower.  Patient was very alert and oriented. RN made the patient aware that he needs to let his Dr know and also may need to go to the hospital. RN also made patient aware that the paramedic could check and make sure that the pulse ox is giving a correct reading  Encounter Medications:  Outpatient Encounter Medications as of 04/15/2019  Medication Sig  . amLODipine (NORVASC) 10 MG tablet Take 10 mg by mouth at bedtime.  Marland Kitchen aspirin EC 81 MG tablet Take 81 mg by mouth daily.  Marland Kitchen atorvastatin (LIPITOR) 80 MG tablet Take 0.5 tablets (40 mg total) by mouth at bedtime.  . carboxymethylcellulose (REFRESH PLUS) 0.5 % SOLN Place 1 drop into both eyes 3 (three) times daily as needed (dry, itch).  . carvedilol (COREG) 3.125 MG tablet TAKE 1 TABLET (3.125 MG TOTAL) BY MOUTH 2 (TWO) TIMES DAILY WITH A MEAL.  Marland Kitchen Cholecalciferol 25 MCG (1000 UT) tablet Take by mouth.  . folic acid (FOLVITE) 947 MCG tablet Take 800 mcg by mouth daily.  . furosemide (LASIX) 40 MG tablet Take 40 mg by  mouth. 80 mg in the morning and 40 mg in the evening.  Marland Kitchen glimepiride (AMARYL) 4 MG tablet Take 4 mg by mouth daily with breakfast.   . hydrALAZINE (APRESOLINE) 100 MG tablet Take 100 mg by mouth 3 (three) times daily.  . Multiple Vitamin (MULTIVITAMIN) tablet Take 1 tablet by mouth daily.   . Multiple Vitamins-Minerals (MULTIVITAMIN ADULTS PO) Take 1 tablet by mouth every morning.  Marland Kitchen OVER THE COUNTER MEDICATION Place 1 spray into the nose daily as needed. Sovereign Silver Nasal Spray for Immune Support  . OVER THE COUNTER MEDICATION Take 1 capsule by mouth every morning.  . TRADJENTA 5 MG TABS tablet Take 5 mg by mouth every morning.   . traZODone (DESYREL) 50 MG tablet Take 50 mg by mouth at bedtime as needed.    No facility-administered encounter medications on file as of 04/15/2019.    Functional Status:  In your present state of health, do you have any difficulty performing the following activities: 02/10/2019 11/11/2018  Hearing? N N  Vision? N N  Difficulty concentrating or making decisions? N N  Walking or climbing stairs? Y Y  Comment uses walker -  Dressing or bathing? N N  Doing errands, shopping? N N  Preparing Food and eating ? N N  Using the Toilet? N N  In the past six months, have you accidently  leaked urine? Y Y  Comment - -  Do you have problems with loss of bowel control? N N  Managing your Medications? N N  Managing your Finances? N N  Housekeeping or managing your Housekeeping? [name] wife handles housekeeping wife  Some recent data might be hidden    Fall/Depression Screening: Fall Risk  04/15/2019 02/10/2019 11/11/2018  Falls in the past year? 0 0 0  Number falls in past yr: 1 0 0  Injury with Fall? 0 0 0  Risk for fall due to : Impaired mobility;Impaired balance/gait Impaired balance/gait;Impaired mobility Impaired balance/gait;Impaired vision;Impaired mobility  Follow up Falls evaluation completed Falls evaluation completed Falls evaluation  completed   PHQ 2/9 Scores 02/10/2019 11/11/2018 08/13/2018 04/16/2018 10/01/2017 08/28/2017  PHQ - 2 Score 0 0 0 0 0 0   THN CM Care Plan Problem One     Most Recent Value  Care Plan Problem One  Knowledge Deficit in Self Management of Congestive Heart Failure  Role Documenting the Problem One  [place] for Problem One  Active  THN Long Term Goal   Patient will follow up on maintenance care within the next 90 days  THN Long Term Goal Start Date  04/15/19  Interventions for Problem One Long Term Goal  RN discussed health maintenance and COVID. Patient has received his  1st COVID vaccine. Patent has scheduled his second vaccine. RN will follow up with further discussion  THN CM Short Term Goal #1   Patient will be able to verbalize the CHF exacerbation zones and action plan  [facility] CM Short Term Goal #1 Start Date  04/15/19  Interventions for Short Term Goal #1  RN reiterated the CHF zones especially since patient is to start dialysis and is retaining fluid. RN went over the importance of daily weights. RNwill follow up with further discussion  THN CM Short Term Goal #2   patient will verbalize a better understanding of the O2 Sat readings  THN CM Short Term Goal #2 Start Date  04/15/19  Interventions for Short Term Goal #2  RN discussed what the SP o2 sat reading means. RN sent the patient educational inforamtio on O2 sat and explained that at certain numbers he needs to make his Dr aware, RNwill follow up with further discussion      Assessment:  Patient pulse ox readings are extremely low Patient notifying Dr  Patient has gained weight Hands and feet and legs swollen Patient has received 1st COVID vaccine  Plan:  Patient has legs elevated Patient taking additional lasix Patient notifying DR of pulse ox readings Patient will follow up with ED Patient has scheduled 2nd COVID vaccine RN will follow up with further outreach within the month of [place] Management [phone]

## 2019-04-18 ENCOUNTER — Emergency Department (HOSPITAL_COMMUNITY): Payer: Medicare Other

## 2019-04-18 ENCOUNTER — Encounter (HOSPITAL_COMMUNITY): Payer: Self-pay

## 2019-04-18 ENCOUNTER — Inpatient Hospital Stay (HOSPITAL_COMMUNITY)
Admission: EM | Admit: 2019-04-18 | Discharge: 2019-04-25 | DRG: 291 | Disposition: A | Discharge status: Home Health | Payer: Medicare Other | Attending: Cardiology | Admitting: Cardiology

## 2019-04-18 ENCOUNTER — Other Ambulatory Visit: Payer: Self-pay

## 2019-04-18 DIAGNOSIS — Z9842 Cataract extraction status, left eye: Secondary | ICD-10-CM

## 2019-04-18 DIAGNOSIS — I361 Nonrheumatic tricuspid (valve) insufficiency: Secondary | ICD-10-CM | POA: Diagnosis not present

## 2019-04-18 DIAGNOSIS — I2510 Atherosclerotic heart disease of native coronary artery without angina pectoris: Secondary | ICD-10-CM | POA: Diagnosis present

## 2019-04-18 DIAGNOSIS — E1151 Type 2 diabetes mellitus with diabetic peripheral angiopathy without gangrene: Secondary | ICD-10-CM | POA: Diagnosis present

## 2019-04-18 DIAGNOSIS — I509 Heart failure, unspecified: Secondary | ICD-10-CM

## 2019-04-18 DIAGNOSIS — Z20822 Contact with and (suspected) exposure to covid-19: Secondary | ICD-10-CM | POA: Diagnosis present

## 2019-04-18 DIAGNOSIS — R0600 Dyspnea, unspecified: Secondary | ICD-10-CM

## 2019-04-18 DIAGNOSIS — I249 Acute ischemic heart disease, unspecified: Secondary | ICD-10-CM | POA: Diagnosis not present

## 2019-04-18 DIAGNOSIS — I5021 Acute systolic (congestive) heart failure: Secondary | ICD-10-CM | POA: Diagnosis not present

## 2019-04-18 DIAGNOSIS — Z961 Presence of intraocular lens: Secondary | ICD-10-CM | POA: Diagnosis present

## 2019-04-18 DIAGNOSIS — I132 Hypertensive heart and chronic kidney disease with heart failure and with stage 5 chronic kidney disease, or end stage renal disease: Secondary | ICD-10-CM | POA: Diagnosis present

## 2019-04-18 DIAGNOSIS — Z8249 Family history of ischemic heart disease and other diseases of the circulatory system: Secondary | ICD-10-CM

## 2019-04-18 DIAGNOSIS — J189 Pneumonia, unspecified organism: Secondary | ICD-10-CM | POA: Diagnosis present

## 2019-04-18 DIAGNOSIS — I5030 Unspecified diastolic (congestive) heart failure: Secondary | ICD-10-CM | POA: Diagnosis not present

## 2019-04-18 DIAGNOSIS — Z87891 Personal history of nicotine dependence: Secondary | ICD-10-CM | POA: Diagnosis not present

## 2019-04-18 DIAGNOSIS — N186 End stage renal disease: Secondary | ICD-10-CM

## 2019-04-18 DIAGNOSIS — Z951 Presence of aortocoronary bypass graft: Secondary | ICD-10-CM | POA: Diagnosis not present

## 2019-04-18 DIAGNOSIS — N185 Chronic kidney disease, stage 5: Secondary | ICD-10-CM | POA: Diagnosis present

## 2019-04-18 DIAGNOSIS — I350 Nonrheumatic aortic (valve) stenosis: Secondary | ICD-10-CM | POA: Diagnosis present

## 2019-04-18 DIAGNOSIS — Z7984 Long term (current) use of oral hypoglycemic drugs: Secondary | ICD-10-CM

## 2019-04-18 DIAGNOSIS — Z79899 Other long term (current) drug therapy: Secondary | ICD-10-CM

## 2019-04-18 DIAGNOSIS — E785 Hyperlipidemia, unspecified: Secondary | ICD-10-CM | POA: Diagnosis present

## 2019-04-18 DIAGNOSIS — D631 Anemia in chronic kidney disease: Secondary | ICD-10-CM | POA: Diagnosis present

## 2019-04-18 DIAGNOSIS — I5033 Acute on chronic diastolic (congestive) heart failure: Secondary | ICD-10-CM | POA: Diagnosis present

## 2019-04-18 DIAGNOSIS — Z9841 Cataract extraction status, right eye: Secondary | ICD-10-CM

## 2019-04-18 DIAGNOSIS — Z7982 Long term (current) use of aspirin: Secondary | ICD-10-CM

## 2019-04-18 DIAGNOSIS — E119 Type 2 diabetes mellitus without complications: Secondary | ICD-10-CM | POA: Diagnosis not present

## 2019-04-18 DIAGNOSIS — Z8546 Personal history of malignant neoplasm of prostate: Secondary | ICD-10-CM | POA: Diagnosis not present

## 2019-04-18 DIAGNOSIS — Z923 Personal history of irradiation: Secondary | ICD-10-CM | POA: Diagnosis not present

## 2019-04-18 DIAGNOSIS — R0602 Shortness of breath: Secondary | ICD-10-CM | POA: Diagnosis present

## 2019-04-18 DIAGNOSIS — E1122 Type 2 diabetes mellitus with diabetic chronic kidney disease: Secondary | ICD-10-CM | POA: Diagnosis present

## 2019-04-18 DIAGNOSIS — N179 Acute kidney failure, unspecified: Secondary | ICD-10-CM | POA: Diagnosis present

## 2019-04-18 DIAGNOSIS — E876 Hypokalemia: Secondary | ICD-10-CM | POA: Diagnosis present

## 2019-04-18 DIAGNOSIS — R0609 Other forms of dyspnea: Secondary | ICD-10-CM

## 2019-04-18 DIAGNOSIS — J9601 Acute respiratory failure with hypoxia: Secondary | ICD-10-CM | POA: Diagnosis present

## 2019-04-18 DIAGNOSIS — I5031 Acute diastolic (congestive) heart failure: Secondary | ICD-10-CM | POA: Diagnosis not present

## 2019-04-18 DIAGNOSIS — I340 Nonrheumatic mitral (valve) insufficiency: Secondary | ICD-10-CM | POA: Diagnosis not present

## 2019-04-18 DIAGNOSIS — Z992 Dependence on renal dialysis: Secondary | ICD-10-CM | POA: Diagnosis not present

## 2019-04-18 HISTORY — DX: Acute ischemic heart disease, unspecified: I24.9

## 2019-04-18 LAB — GLUCOSE, CAPILLARY: Glucose-Capillary: 244 mg/dL — ABNORMAL HIGH (ref 70–99)

## 2019-04-18 LAB — BASIC METABOLIC PANEL
Anion gap: 16 — ABNORMAL HIGH (ref 5–15)
BUN: 53 mg/dL — ABNORMAL HIGH (ref 8–23)
CO2: 37 mmol/L — ABNORMAL HIGH (ref 22–32)
Calcium: 8.7 mg/dL — ABNORMAL LOW (ref 8.9–10.3)
Chloride: 89 mmol/L — ABNORMAL LOW (ref 98–111)
Creatinine, Ser: 4.36 mg/dL — ABNORMAL HIGH (ref 0.61–1.24)
GFR calc Af Amer: 14 mL/min — ABNORMAL LOW (ref >60)
GFR calc non Af Amer: 12 mL/min — ABNORMAL LOW (ref >60)
Glucose, Bld: 309 mg/dL — ABNORMAL HIGH (ref 70–99)
Potassium: 2.6 mmol/L — CL (ref 3.5–5.1)
Sodium: 142 mmol/L (ref 135–145)

## 2019-04-18 LAB — CBC WITH DIFFERENTIAL/PLATELET
Abs Immature Granulocytes: 0.04 10*3/uL (ref 0.00–0.07)
Basophils Absolute: 0 10*3/uL (ref 0.0–0.1)
Basophils Relative: 0 %
Eosinophils Absolute: 0.1 10*3/uL (ref 0.0–0.5)
Eosinophils Relative: 1 %
HCT: 36.7 % — ABNORMAL LOW (ref 39.0–52.0)
Hemoglobin: 11.4 g/dL — ABNORMAL LOW (ref 13.0–17.0)
Immature Granulocytes: 0 %
Lymphocytes Relative: 12 %
Lymphs Abs: 1.5 10*3/uL (ref 0.7–4.0)
MCH: 31.8 pg (ref 26.0–34.0)
MCHC: 31.1 g/dL (ref 30.0–36.0)
MCV: 102.5 fL — ABNORMAL HIGH (ref 80.0–100.0)
Monocytes Absolute: 0.8 10*3/uL (ref 0.1–1.0)
Monocytes Relative: 7 %
Neutro Abs: 9.9 10*3/uL — ABNORMAL HIGH (ref 1.7–7.7)
Neutrophils Relative %: 80 %
Platelets: 336 10*3/uL (ref 150–400)
RBC: 3.58 MIL/uL — ABNORMAL LOW (ref 4.22–5.81)
RDW: 13.9 % (ref 11.5–15.5)
WBC: 12.4 10*3/uL — ABNORMAL HIGH (ref 4.0–10.5)
nRBC: 0.4 % — ABNORMAL HIGH (ref 0.0–0.2)

## 2019-04-18 LAB — POC SARS CORONAVIRUS 2 AG -  ED: SARS Coronavirus 2 Ag: NEGATIVE

## 2019-04-18 LAB — BRAIN NATRIURETIC PEPTIDE: B Natriuretic Peptide: 1406.6 pg/mL — ABNORMAL HIGH (ref 0.0–100.0)

## 2019-04-18 LAB — TROPONIN I (HIGH SENSITIVITY)
Troponin I (High Sensitivity): 335 ng/L (ref <18)
Troponin I (High Sensitivity): 349 ng/L (ref <18)

## 2019-04-18 MED ORDER — ASPIRIN EC 81 MG PO TBEC
81.0000 mg | DELAYED_RELEASE_TABLET | Freq: Every day | ORAL | Status: DC
  Administered 2019-04-19 – 2019-04-25 (×7): 81 mg via ORAL
  Filled 2019-04-18 (×7): qty 1

## 2019-04-18 MED ORDER — SODIUM CHLORIDE 0.9 % IV SOLN
500.0000 mg | Freq: Once | INTRAVENOUS | Status: AC
  Administered 2019-04-19: 500 mg via INTRAVENOUS
  Filled 2019-04-18: qty 500

## 2019-04-18 MED ORDER — HEPARIN BOLUS VIA INFUSION
4000.0000 [IU] | Freq: Once | INTRAVENOUS | Status: AC
  Administered 2019-04-18: 4000 [IU] via INTRAVENOUS
  Filled 2019-04-18: qty 4000

## 2019-04-18 MED ORDER — AMLODIPINE BESYLATE 5 MG PO TABS
10.0000 mg | ORAL_TABLET | Freq: Every day | ORAL | Status: DC
  Administered 2019-04-18 – 2019-04-24 (×7): 10 mg via ORAL
  Filled 2019-04-18 (×8): qty 2

## 2019-04-18 MED ORDER — ATORVASTATIN CALCIUM 40 MG PO TABS
40.0000 mg | ORAL_TABLET | Freq: Every day | ORAL | Status: DC
  Administered 2019-04-18 – 2019-04-24 (×7): 40 mg via ORAL
  Filled 2019-04-18 (×7): qty 1

## 2019-04-18 MED ORDER — INSULIN ASPART 100 UNIT/ML ~~LOC~~ SOLN
0.0000 [IU] | Freq: Three times a day (TID) | SUBCUTANEOUS | Status: DC
  Administered 2019-04-19 – 2019-04-20 (×4): 2 [IU] via SUBCUTANEOUS
  Administered 2019-04-20: 1 [IU] via SUBCUTANEOUS
  Administered 2019-04-20: 2 [IU] via SUBCUTANEOUS

## 2019-04-18 MED ORDER — ADULT MULTIVITAMIN W/MINERALS CH
1.0000 | ORAL_TABLET | Freq: Every day | ORAL | Status: DC
  Administered 2019-04-19 – 2019-04-25 (×7): 1 via ORAL
  Filled 2019-04-18 (×7): qty 1

## 2019-04-18 MED ORDER — TRAZODONE HCL 50 MG PO TABS
50.0000 mg | ORAL_TABLET | Freq: Every evening | ORAL | Status: DC | PRN
  Administered 2019-04-18 – 2019-04-24 (×6): 50 mg via ORAL
  Filled 2019-04-18 (×6): qty 1

## 2019-04-18 MED ORDER — HEPARIN (PORCINE) 25000 UT/250ML-% IV SOLN
1050.0000 [IU]/h | INTRAVENOUS | Status: DC
  Administered 2019-04-18: 900 [IU]/h via INTRAVENOUS
  Filled 2019-04-18: qty 250

## 2019-04-18 MED ORDER — CARVEDILOL 3.125 MG PO TABS
3.1250 mg | ORAL_TABLET | Freq: Two times a day (BID) | ORAL | Status: DC
  Administered 2019-04-19 – 2019-04-25 (×13): 3.125 mg via ORAL
  Filled 2019-04-18 (×13): qty 1

## 2019-04-18 MED ORDER — POTASSIUM CHLORIDE CRYS ER 10 MEQ PO TBCR
20.0000 meq | EXTENDED_RELEASE_TABLET | Freq: Two times a day (BID) | ORAL | Status: DC
  Administered 2019-04-18 – 2019-04-19 (×2): 20 meq via ORAL
  Filled 2019-04-18 (×4): qty 2

## 2019-04-18 MED ORDER — SODIUM CHLORIDE 0.9 % IV SOLN
1.0000 g | INTRAVENOUS | Status: DC
  Administered 2019-04-18 – 2019-04-24 (×7): 1 g via INTRAVENOUS
  Filled 2019-04-18 (×7): qty 10

## 2019-04-18 MED ORDER — ACETAMINOPHEN 325 MG PO TABS: 650.0000 mg | ORAL_TABLET | ORAL | Status: DC | PRN

## 2019-04-18 MED ORDER — ONDANSETRON HCL 4 MG/2ML IJ SOLN: 4.0000 mg | Freq: Four times a day (QID) | INTRAMUSCULAR | Status: DC | PRN

## 2019-04-18 MED ORDER — NITROGLYCERIN 0.4 MG SL SUBL: 0.4000 mg | SUBLINGUAL_TABLET | SUBLINGUAL | Status: DC | PRN

## 2019-04-18 MED ORDER — FOLIC ACID 1 MG PO TABS
1.0000 mg | ORAL_TABLET | Freq: Every day | ORAL | Status: DC
  Administered 2019-04-19 – 2019-04-25 (×7): 1 mg via ORAL
  Filled 2019-04-18 (×7): qty 1

## 2019-04-18 MED ORDER — HYDRALAZINE HCL 50 MG PO TABS
50.0000 mg | ORAL_TABLET | Freq: Three times a day (TID) | ORAL | Status: DC
  Administered 2019-04-18 – 2019-04-25 (×20): 50 mg via ORAL
  Filled 2019-04-18 (×20): qty 1

## 2019-04-18 MED ORDER — HYPROMELLOSE (GONIOSCOPIC) 2.5 % OP SOLN
1.0000 [drp] | Freq: Three times a day (TID) | OPHTHALMIC | Status: DC | PRN
  Filled 2019-04-18: qty 15

## 2019-04-18 NOTE — ED Provider Notes (Signed)
[facility] EMERGENCY DEPARTMENT Provider Note   CSN: [id number] Arrival date & time: 04/18/19  1615     History Chief Complaint  Patient presents with  . Shortness of Breath    [name] is a 86 y.o. male w/ hx of aortic stenosis, CAD s/p CABG 1980's, HTn, HLD, ESRD scheduled to start dialysis March 8th, DM2, presenting to the ED with SOB.  He reports progressively worsening shortness of breath for the past several months, acutely worsening in the past day or 2.  He says he also has had some lightheadedness particular with ambulation for the past several months.  He is scheduled to start dialysis next month.  He had a peritoneal dialysis site placed by a vascular surgeon within the past month.  He does continue to make urine daily.  He is on Lasix for congestive heart failure.  His doctor recently doubled his dose yesterday to twice daily instead of once daily.  Reports bilateral leg swelling, thinks he is putting on weight  No fevers, chills Got 1 covid vaccine so far    HPI     Past Medical History:  Diagnosis Date  . Arthritis    "a little in LLE" (06/20/2017)  . Chronic diastolic CHF (congestive heart failure) ([place])   . CKD (chronic kidney disease), stage IV ([place])   . Coronary artery disease    a. s/p CABG 1980s. b. stable by cath 05/2017.  Marland Kitchen Dyslipidemia   . Edema 08/08/2017   HANDS & LOWER EXTREMITES  . Hypertension   . Hypoalbuminemia   . Nephrotic range proteinuria   . Peripheral artery disease (HCC)    mild  . Pneumonia ~ 1950 X 1  . Prostate cancer ([place]) ~ 2015  . Type II diabetes mellitus [facility])     Patient Active Problem List   Diagnosis Date Noted  . Acute coronary syndrome ([place]) 04/18/2019  . Hypoxia 08/15/2017  . Nephrotic range proteinuria   . Difficulty urinating   . Diastolic CHF, chronic ([place]) 08/08/2017  . SOB (shortness of breath) on exertion 06/20/2017  . Other fatigue 06/19/2017  . SOB (shortness of breath)  06/19/2017  . Acute congestive heart failure ([place]) 02/28/2017  . Peripheral artery disease ([place])   . Hypertension   . DM (diabetes mellitus) ([place])   . Coronary artery disease   . Aortic valve stenosis 10/16/2016  . Uncontrolled hypersomnia 10/07/2015  . Acute kidney injury superimposed on chronic kidney disease ([place]) 09/18/2014  . Murmur 09/12/2013  . Dizziness 08/22/2012  . CAD (coronary artery disease) of artery bypass graft 08/22/2012  . Hx of CABG 08/22/2012  . Essential hypertension 08/22/2012  . Dyslipidemia 08/22/2012  . PAD (peripheral artery disease) ([place]) 08/22/2012  . DM2 (diabetes mellitus, type 2) ([place]) 08/22/2012    Past Surgical History:  Procedure Laterality Date  . CARDIAC CATHETERIZATION     "a couple times" (06/20/2017)  . CATARACT EXTRACTION W/ INTRAOCULAR LENS  IMPLANT, BILATERAL Bilateral   . CORONARY ARTERY BYPASS GRAFT  03/06/1985   LAD and first diagonal/circumflex (sequential saphenous vein graft,cardioplegia  . INSERTION PROSTATE RADIATION SEED  ~ 2015  . NM MYOCAR PERF WALL MOTION  09/12/2006   no ischemia  . RIGHT/LEFT HEART CATH AND CORONARY/GRAFT ANGIOGRAPHY N/A 06/21/2017   Procedure: RIGHT/LEFT HEART CATH AND CORONARY/GRAFT ANGIOGRAPHY;  Surgeon: Burnell Blanks, MD;  Location: [facility] CV LAB;  Service: Cardiovascular;  Laterality: N/A;  . US ECHOCARDIOGRAPHY  11/04/2008   trace TR &  MR       Family History  Problem Relation Age of Onset  . Heart attack Mother     Social History   Tobacco Use  . Smoking status: Former Smoker    Packs/day: 1.00    Years: 20.00    Pack years: 20.00    Types: Cigarettes    Quit date: 08/22/1972    Years since quitting: 46.6  . Smokeless tobacco: Never Used  Substance Use Topics  . Alcohol use: Not Currently    Comment: 06/20/2017 "nothing since ~ 1974"  . Drug use: No    Home Medications Prior to Admission medications   Medication Sig Start Date End Date Taking? Authorizing Provider    acetaminophen (TYLENOL) 650 MG CR tablet Take 650 mg by mouth at bedtime.   Yes [provider]  amLODipine (NORVASC) 10 MG tablet Take 10 mg by mouth at bedtime.   Yes [provider]  aspirin EC 81 MG tablet Take 81 mg by mouth daily.   Yes [provider]  atorvastatin (LIPITOR) 80 MG tablet Take 0.5 tablets (40 mg total) by mouth at bedtime. 04/03/19  Yes Kroeger, Daleen Snook M., PA-C  carboxymethylcellulose (REFRESH PLUS) 0.5 % SOLN Place 1 drop into both eyes 3 (three) times daily as needed (dry, itch).   Yes [provider]  carvedilol (COREG) 3.125 MG tablet TAKE 1 TABLET (3.125 MG TOTAL) BY MOUTH 2 (TWO) TIMES DAILY WITH A MEAL. 01/29/19  Yes HiltyNadean Corwin, MD  Cholecalciferol 25 MCG (1000 UT) tablet Take by mouth.   Yes [provider]  folic acid (FOLVITE) 161 MCG tablet Take 800 mcg by mouth daily.   Yes [provider]  furosemide (LASIX) 40 MG tablet Take 80 mg by mouth daily. 80 mg in the morning and 40 mg in the evening.    Yes [provider]  glimepiride (AMARYL) 4 MG tablet Take 4 mg by mouth daily with breakfast.    Yes [provider]  hydrALAZINE (APRESOLINE) 100 MG tablet Take 100 mg by mouth 3 (three) times daily.   Yes [provider]  Multiple Vitamin (MULTIVITAMIN) tablet Take 1 tablet by mouth daily.    Yes [provider]  OVER THE COUNTER MEDICATION Place 1 spray into the nose daily as needed. Sovereign Silver Nasal Spray for Immune Support   Yes [provider]  OVER THE COUNTER MEDICATION Take 1 capsule by mouth every morning.   Yes [provider]  TRADJENTA 5 MG TABS tablet Take 5 mg by mouth every morning.  02/27/17  Yes [provider]  traZODone (DESYREL) 50 MG tablet Take 50 mg by mouth at bedtime as needed for sleep.  07/12/17  Yes [provider]    Allergies    Patient has no known allergies.  Review of Systems   Review of Systems   Constitutional: Negative for chills and fever.  Respiratory: Positive for cough and shortness of breath.   Cardiovascular: Negative for chest pain and palpitations.  Gastrointestinal: Negative for abdominal pain and vomiting.  Skin: Negative for color change and rash.  Neurological: Positive for light-headedness. Negative for syncope.  All other systems reviewed and are negative.   Physical Exam Updated Vital Signs BP (!) 132/56 (BP Location: Left Arm)   Pulse 70   Temp 98.7 F (37.1 C) (Oral)   Resp 17   Wt 75.8 kg   SpO2 90%   BMI 23.31 kg/m   Physical Exam Vitals and  nursing note reviewed.  Constitutional:      Appearance: He is well-developed.  HENT:     Head: Normocephalic and atraumatic.  Eyes:     Conjunctiva/sclera: Conjunctivae normal.  Cardiovascular:     Rate and Rhythm: Normal rate and regular rhythm.     Pulses: Normal pulses.     Heart sounds: Murmur present.  Pulmonary:     Effort: Pulmonary effort is normal.     Breath sounds: Normal breath sounds.     Comments: Crackles in bilateral lung bases Abdominal:     Palpations: Abdomen is soft.     Tenderness: There is no abdominal tenderness.  Musculoskeletal:     Cervical back: Neck supple.     Right lower leg: Edema present.     Left lower leg: Edema present.  Skin:    General: Skin is warm and dry.  Neurological:     Mental Status: He is alert.     ED Results / Procedures / Treatments   Labs (all labs ordered are listed, but only abnormal results are displayed) Labs Reviewed  BASIC METABOLIC PANEL - Abnormal; Notable for the following components:      Result Value   Potassium 2.6 (*)    Chloride 89 (*)    CO2 37 (*)    Glucose, Bld 309 (*)    BUN 53 (*)    Creatinine, Ser 4.36 (*)    Calcium 8.7 (*)    GFR calc non Af Amer 12 (*)    GFR calc Af Amer 14 (*)    Anion gap 16 (*)    All other components within normal limits  CBC WITH DIFFERENTIAL/PLATELET - Abnormal; Notable for the  following components:   WBC 12.4 (*)    RBC 3.58 (*)    Hemoglobin 11.4 (*)    HCT 36.7 (*)    MCV 102.5 (*)    nRBC 0.4 (*)    Neutro Abs 9.9 (*)    All other components within normal limits  BRAIN NATRIURETIC PEPTIDE - Abnormal; Notable for the following components:   B Natriuretic Peptide 1,406.6 (*)    All other components within normal limits  GLUCOSE, CAPILLARY - Abnormal; Notable for the following components:   Glucose-Capillary 244 (*)    All other components within normal limits  TROPONIN I (HIGH SENSITIVITY) - Abnormal; Notable for the following components:   Troponin I (High Sensitivity) 335 (*)    All other components within normal limits  TROPONIN I (HIGH SENSITIVITY) - Abnormal; Notable for the following components:   Troponin I (High Sensitivity) 349 (*)    All other components within normal limits  SARS CORONAVIRUS 2 (TAT 6-24 HRS)  HEPARIN LEVEL (UNFRACTIONATED)  CBC  BASIC METABOLIC PANEL  LIPID PANEL  PROTIME-INR  POC SARS CORONAVIRUS 2 AG -  ED    EKG EKG Interpretation  Date/Time:  Friday April 18 2019 16:54:29 EST Ventricular Rate:  72 PR Interval:    QRS Duration: 91 QT Interval:  388 QTC Calculation: 425 R Axis:   75 Text Interpretation: Sinus rhythm LVH with secondary repolarization abnormality No STEMI Confirmed by Octaviano Glow [id number]) on 04/18/2019 5:21:26 PM   Radiology DG Chest Portable 1 View  Result Date: 04/18/2019 CLINICAL DATA:  86 year old male with shortness of breath. EXAM: PORTABLE CHEST 1 VIEW COMPARISON:  Chest radiograph dated 08/09/2017 FINDINGS: There is a background of emphysema. Small right pleural effusion with associated right lung base atelectasis or infiltrate. There is diffuse interstitial  and airspace density primarily involving the mid to lower lung fields which may represent edema but concerning for pneumonia. Clinical correlation is recommended. No pneumothorax. There is mild cardiomegaly. Median sternotomy wires  and CABG vascular clips. No acute osseous pathology. IMPRESSION: Small right pleural effusion with findings of edema, pneumonia or combination. Clinical correlation and follow-up recommended. Electronically Signed   By: Anner Crete M.D.   On: 04/18/2019 17:37    Procedures .Critical Care Performed by: Wyvonnia Dusky, MD Authorized by: Wyvonnia Dusky, MD   Critical care provider statement:    Critical care time (minutes):  43   Critical care was necessary to treat or prevent imminent or life-threatening deterioration of the following conditions:  Respiratory failure and cardiac failure   Critical care was time spent personally by me on the following activities:  Discussions with consultants, evaluation of patient's response to treatment, examination of patient, ordering and performing treatments and interventions, ordering and review of laboratory studies, ordering and review of radiographic studies, pulse oximetry, re-evaluation of patient's condition, obtaining history from patient or surrogate and review of old charts Comments:     Hypoxia 2/2 congestive heart failure, elevated troponin, initiation of heparin and supplemental oxygen, discussion with cardiology   (including critical care time)  Medications Ordered in ED Medications  heparin ADULT infusion 100 units/mL (25000 units/242mL sodium chloride 0.45%) (900 Units/hr Intravenous New Bag/Given 04/18/19 1944)  aspirin EC tablet 81 mg (has no administration in time range)  nitroGLYCERIN (NITROSTAT) SL tablet 0.4 mg (has no administration in time range)  acetaminophen (TYLENOL) tablet 650 mg (has no administration in time range)  ondansetron (ZOFRAN) injection 4 mg (has no administration in time range)  amLODipine (NORVASC) tablet 10 mg (10 mg Oral Given 04/18/19 2222)  atorvastatin (LIPITOR) tablet 40 mg (40 mg Oral Given 04/18/19 2208)  hydroxypropyl methylcellulose / hypromellose (ISOPTO TEARS / GONIOVISC) 2.5 % ophthalmic  solution 1 drop (has no administration in time range)  carvedilol (COREG) tablet 3.125 mg (has no administration in time range)  folic acid (FOLVITE) tablet 1 mg (has no administration in time range)  multivitamin with minerals tablet 1 tablet (has no administration in time range)  traZODone (DESYREL) tablet 50 mg (50 mg Oral Given 04/18/19 2209)  hydrALAZINE (APRESOLINE) tablet 50 mg (50 mg Oral Given 04/18/19 2208)  potassium chloride (KLOR-CON) CR tablet 20 mEq (20 mEq Oral Given 04/18/19 2208)  insulin aspart (novoLOG) injection 0-6 Units (has no administration in time range)  cefTRIAXone (ROCEPHIN) 1 g in sodium chloride 0.9 % 100 mL IVPB (1 g Intravenous New Bag/Given 04/18/19 2329)  azithromycin (ZITHROMAX) 500 mg in sodium chloride 0.9 % 250 mL IVPB (500 mg Intravenous New Bag/Given 04/19/19 0007)  heparin bolus via infusion 4,000 Units (4,000 Units Intravenous Bolus from Bag 04/18/19 1944)    ED Course  I have reviewed the triage vital signs and the nursing notes.  Pertinent labs & imaging results that were available during my care of the patient were reviewed by me and considered in my medical decision making (see chart for details).  86 yo male presenting with worsening SOB and dyspnea on exertion, lightheadness with activity for several weeks.  Today noted to be hypoxic and sent into ED.  Here requiring supplemental O2, but not in acute respiratory distress.  ECG showing no STEMI but signs of LVH that appears new from last year.  This raises concern for worsening aortic stenosis.  He still has a prominent systolic murmur.  No  active lightheadedness in the room.  He'll likely need another echocardiogram.  Do not believe he requires emergent dialysis at this time.  Elevated trop, noted.  See ED course below Started on heparin   [name] was evaluated in Emergency Department on 04/19/2019 for the symptoms described in the history of present illness. He was evaluated in the  context of the global COVID-19 pandemic, which necessitated consideration that the patient might be at risk for infection with the SARS-CoV-2 virus that causes COVID-19. Institutional protocols and algorithms that pertain to the evaluation of patients at risk for COVID-19 are in a state of rapid change based on information released by regulatory bodies including the CDC and federal and state organizations. These policies and algorithms were followed during the patient's care in the ED.   Clinical Course as of Apr 18 30  Fri Apr 18, 2019  1827 Troponin I (High Sensitivity)(!!): 335 [MT]  450 [id number] [id number] With no chest pain or active symptoms, I am holding off on heparin, not clear if this elevated troponin is related to CKD and some CHF demand ischemia, less likely ACS at this point, but I have paged cardiology and will trend his level   [MT]  1838 SARS Coronavirus 2 Ag: NEGATIVE [MT]  1853 I spoke to Dr Doylene Canard who advised that I start heparin, he will come evaluate patient   [MT]    Clinical Course User Index [MT] Wyvonnia Dusky, MD   Final Clinical Impression(s) / ED Diagnoses Final diagnoses:  Hypokalemia  Congestive heart failure, unspecified HF chronicity, unspecified heart failure type [facility])  Aortic valve stenosis, etiology of cardiac valve disease unspecified  Dyspnea on exertion    Rx / DC Orders ED Discharge Orders    None       Wyvonnia Dusky, MD 04/19/19 [phone]

## 2019-04-18 NOTE — H&P (Signed)
Referring Physician: Callen, Vancuren is an 86 y.o. male.                       Chief Complaint: Shortness of breath  HPI: 86 years old black male with PMH of CKD, IV, type 2 DM, Prostate cancer, hypertension, CAD, CABG and chronic diastolic CHF had low oxygen saturation at home and in ED until supplemental oxygen is given. His initial troponin I is elevated at 335 ng. Patient deniers chest pain but admits to shortness of breath at rest and with activity. His COVID test is negative. Chest x-ray is positive for pulmonary edema or pneumonia. BNP is pending. He has CKD, IV and has peritoneal catheter placed but has not started dialysis. WBC count is elevated.  Past Medical History:  Diagnosis Date  . Arthritis    "a little in LLE" (06/20/2017)  . Chronic diastolic CHF (congestive heart failure) ([place])   . CKD (chronic kidney disease), stage IV ([place])   . Coronary artery disease    a. s/p CABG 1980s. b. stable by cath 05/2017.  Marland Kitchen Dyslipidemia   . Edema 08/08/2017   HANDS & LOWER EXTREMITES  . Hypertension   . Hypoalbuminemia   . Nephrotic range proteinuria   . Peripheral artery disease (HCC)    mild  . Pneumonia ~ 1950 X 1  . Prostate cancer ([place]) ~ 2015  . Type II diabetes mellitus ([place])       Past Surgical History:  Procedure Laterality Date  . CARDIAC CATHETERIZATION     "a couple times" (06/20/2017)  . CATARACT EXTRACTION W/ INTRAOCULAR LENS  IMPLANT, BILATERAL Bilateral   . CORONARY ARTERY BYPASS GRAFT  03/06/1985   LAD and first diagonal/circumflex (sequential saphenous vein graft,cardioplegia  . INSERTION PROSTATE RADIATION SEED  ~ 2015  . NM MYOCAR PERF WALL MOTION  09/12/2006   no ischemia  . RIGHT/LEFT HEART CATH AND CORONARY/GRAFT ANGIOGRAPHY N/A 06/21/2017   Procedure: RIGHT/LEFT HEART CATH AND CORONARY/GRAFT ANGIOGRAPHY;  Surgeon: Burnell Blanks, MD;  Location: [facility] CV LAB;  Service: Cardiovascular;  Laterality: N/A;  . US ECHOCARDIOGRAPHY   11/04/2008   trace TR & MR    Family History  Problem Relation Age of Onset  . Heart attack Mother    Social History:  reports that he quit smoking about 46 years ago. His smoking use included cigarettes. He has a 20.00 pack-year smoking history. He has never used smokeless tobacco. He reports previous alcohol use. He reports that he does not use drugs.  Allergies: No Known Allergies  (Not in a hospital admission)   Results for orders placed or performed during the hospital encounter of 04/18/19 (from the past 48 hour(s))  Basic metabolic panel     Status: Abnormal   Collection Time: 04/18/19  4:52 PM  Result Value Ref Range   Sodium 142 135 - 145 mmol/L   Potassium 2.6 (LL) 3.5 - 5.1 mmol/L    Comment: CRITICAL RESULT CALLED TO, READ BACK BY AND VERIFIED WITH: H MASHBURN RN AT 1823 ON [id number] BY K FORSYTH    Chloride 89 (L) 98 - 111 mmol/L   CO2 37 (H) 22 - 32 mmol/L   Glucose, Bld 309 (H) 70 - 99 mg/dL   BUN 53 (H) 8 - 23 mg/dL   Creatinine, Ser 4.36 (H) 0.61 - 1.24 mg/dL   Calcium 8.7 (L) 8.9 - 10.3 mg/dL   GFR calc non Af Amer 12 (L) >  60 mL/min   GFR calc Af Amer 14 (L) >60 mL/min   Anion gap 16 (H) 5 - 15    Comment: Performed at [facility] [address], [place], [place] [address]  CBC with Differential     Status: Abnormal   Collection Time: 04/18/19  4:52 PM  Result Value Ref Range   WBC 12.4 (H) 4.0 - 10.5 K/uL   RBC 3.58 (L) 4.22 - 5.81 MIL/uL   Hemoglobin 11.4 (L) 13.0 - 17.0 g/dL   HCT 36.7 (L) 39.0 - 52.0 %   MCV 102.5 (H) 80.0 - 100.0 fL   MCH 31.8 26.0 - 34.0 pg   MCHC 31.1 30.0 - 36.0 g/dL   RDW 13.9 11.5 - 15.5 %   Platelets 336 150 - 400 K/uL   nRBC 0.4 (H) 0.0 - 0.2 %   Neutrophils Relative % 80 %   Neutro Abs 9.9 (H) 1.7 - 7.7 K/uL   Lymphocytes Relative 12 %   Lymphs Abs 1.5 0.7 - 4.0 K/uL   Monocytes Relative 7 %   Monocytes Absolute 0.8 0.1 - 1.0 K/uL   Eosinophils Relative 1 %   Eosinophils Absolute 0.1 0.0 - 0.5 K/uL   Basophils  Relative 0 %   Basophils Absolute 0.0 0.0 - 0.1 K/uL   Immature Granulocytes 0 %   Abs Immature Granulocytes 0.04 0.00 - 0.07 K/uL    Comment: Performed at [facility], [place] [address], [place], [place] [address]  Troponin I (High Sensitivity)     Status: Abnormal   Collection Time: 04/18/19  4:52 PM  Result Value Ref Range   Troponin I (High Sensitivity) 335 (HH) <18 ng/L    Comment: CRITICAL RESULT CALLED TO, READ BACK BY AND VERIFIED WITH: H MASHBURN RN AT 1823 ON [id number] BY K FORSYTH (NOTE) Elevated high sensitivity troponin I (hsTnI) values and significant  changes across serial measurements may suggest ACS but many other  chronic and acute conditions are known to elevate hsTnI results.  Refer to the Links section for chest pain algorithms and additional  guidance. Performed at [facility], [place] [address] [place], [place] [address]   Brain natriuretic peptide     Status: Abnormal   Collection Time: 04/18/19  4:52 PM  Result Value Ref Range   B Natriuretic Peptide 1,406.6 (H) 0.0 - 100.0 pg/mL    Comment: Performed at [facility] [address] [place], [place] [address]  POC SARS Coronavirus 2 Ag-ED - Nasal Swab (BD Veritor Kit)     Status: None   Collection Time: 04/18/19  6:32 PM  Result Value Ref Range   SARS Coronavirus 2 Ag NEGATIVE NEGATIVE    Comment: (NOTE) SARS-CoV-2 antigen NOT DETECTED.  Negative results are presumptive.  Negative results do not preclude SARS-CoV-2 infection and should not be used as the sole basis for treatment or other patient management decisions, including infection  control decisions, particularly in the presence of clinical signs and  symptoms consistent with COVID-19, or in those who have been in contact with the virus.  Negative results must be combined with clinical observations, patient history, and epidemiological information. The expected result is Negative. Fact Sheet for Patients:  PodPark.tn Fact Sheet for Healthcare Providers: GiftContent.is This test is not yet approved or cleared by the [place] FDA and  has been authorized for detection and/or diagnosis of SARS-CoV-2 by FDA under an Emergency Use Authorization (EUA).  This EUA will  remain in effect (meaning this test can be used) for the duration of  the COVID-19 de claration under Section 564(b)(1) of the Act, 21 U.S.C. section 360bbb-3(b)(1), unless the authorization is terminated or revoked sooner.    DG Chest Portable 1 View  Result Date: 04/18/2019 CLINICAL DATA:  86 year old male with shortness of breath. EXAM: PORTABLE CHEST 1 VIEW COMPARISON:  Chest radiograph dated 08/09/2017 FINDINGS: There is a background of emphysema. Small right pleural effusion with associated right lung base atelectasis or infiltrate. There is diffuse interstitial and airspace density primarily involving the mid to lower lung fields which may represent edema but concerning for pneumonia. Clinical correlation is recommended. No pneumothorax. There is mild cardiomegaly. Median sternotomy wires and CABG vascular clips. No acute osseous pathology. IMPRESSION: Small right pleural effusion with findings of edema, pneumonia or combination. Clinical correlation and follow-up recommended. Electronically Signed   By: Anner Crete M.D.   On: 04/18/2019 17:37    Review Of Systems Constitutional: No fever, chills, weight loss or gain. Eyes: No vision change, wears glasses. No discharge or pain. Ears: No hearing loss, No tinnitus. Respiratory: No asthma, COPD, positive pneumonias, shortness of breath. No hemoptysis. Cardiovascular: No chest pain, palpitation, positive leg edema. Gastrointestinal: No nausea, vomiting, diarrhea, constipation. No GI bleed. No hepatitis. Genitourinary: No dysuria, hematuria, kidney stone. No incontinance. CKD, IV, awaiting peritoneal  dialysis. Neurological: No headache, stroke, seizures.  Psychiatry: No psych facility admission for anxiety, depression, suicide. No detox. Skin: No rash. Musculoskeletal: Positive joint pain, no fibromyalgia. No neck pain, back pain. Lymphadenopathy: No lymphadenopathy. Hematology: No anemia or easy bruising.   Blood pressure (!) 150/72, pulse 68, temperature 98.4 F (36.9 C), temperature source Oral, resp. rate 20, weight 75.8 kg, SpO2 91 %. Body mass index is 23.31 kg/m. General appearance: alert, cooperative, appears stated age and mild respiratory distress Head: Normocephalic, atraumatic. Eyes: Brown eyes, pink conjunctiva, corneas clear. PERRL, EOM's intact. Neck: No adenopathy, no carotid bruit, no JVD, supple, symmetrical, trachea midline and thyroid not enlarged. Resp: Crackles at bases to auscultation bilaterally. Cardio: Regular rate and rhythm, S1, S2 normal, II/VI systolic murmur, no click, rub or gallop GI: Soft, non-tender; bowel sounds normal; no organomegaly. Extremities: 1 + edema, no cyanosis or clubbing. Skin: Warm and dry.  Neurologic: Alert and oriented X 3, normal strength. Normal coordination.  Assessment/Plan Acute coronary syndrome Acute on chronic diastolic left heart failure Possible community acquired Pneumonia CKD, IV HTN CAD CABG Type 2 DM Hypokalemia  Admit. IV heparin. Potassium supplement. IV antibiotics. R/O influenza A, B and SARS-2 infection  Time spent: Review of old records, Lab, x-rays, EKG, other cardiac tests, examination, discussion with patient over 70 minutes.  Birdie Riddle, MD  04/18/2019, 7:27 PM

## 2019-04-18 NOTE — ED Notes (Signed)
Critical alert: Delta trop: 349

## 2019-04-18 NOTE — Progress Notes (Signed)
ANTICOAGULATION CONSULT NOTE - Initial Consult  Pharmacy Consult for heparin Indication: chest pain/ACS  No Known Allergies  Patient Measurements: Weight: 167 lb 1.7 oz (75.8 kg) Heparin Dosing Weight: TBW  Vital Signs: Temp: 98.4 F (36.9 C) (02/19 1652) Temp Source: Oral (02/19 1652) BP: 150/72 (02/19 1845) Pulse Rate: 68 (02/19 1845)  Labs: Recent Labs    04/18/19 1652  HGB 11.4*  HCT 36.7*  PLT 336  CREATININE 4.36*  TROPONINIHS 335*    Estimated Creatinine Clearance: 13.7 mL/min (A) (by C-G formula based on SCr of 4.36 mg/dL (H)).   Medical History: Past Medical History:  Diagnosis Date  . Arthritis    "a little in LLE" (06/20/2017)  . Chronic diastolic CHF (congestive heart failure) ([place])   . CKD (chronic kidney disease), stage IV ([place])   . Coronary artery disease    a. s/p CABG 1980s. b. stable by cath 05/2017.  Marland Kitchen Dyslipidemia   . Edema 08/08/2017   HANDS & LOWER EXTREMITES  . Hypertension   . Hypoalbuminemia   . Nephrotic range proteinuria   . Peripheral artery disease (HCC)    mild  . Pneumonia ~ 1950 X 1  . Prostate cancer ([place]) ~ 2015  . Type II diabetes mellitus (HCC)    Assessment: 89 YOM presenting with SOB, hx CAD s/p CABG, has PD cath but not using for PD yet.  Not on anticoagulation PTA, chronic anemia of ESRD stable.    Goal of Therapy:  Heparin level 0.3-0.7 units/ml Monitor platelets by anticoagulation protocol: Yes   Plan:  Heparin 4000 units IV x 1, and gtt at 900 units/hr F/u 8 hour heparin level  Bertis Ruddy, PharmD Clinical Pharmacist Please check AMION for all [place] numbers 04/18/2019 7:02 PM

## 2019-04-18 NOTE — ED Notes (Signed)
Jimi Schappert (wife)  Home: [phone] Cell: [phone]

## 2019-04-18 NOTE — ED Notes (Signed)
Critical alert: Troponin 335 Potassium 2.6

## 2019-04-18 NOTE — ED Triage Notes (Signed)
Pt reports DOE for the past few days, wife checked his oxygen levels and they were in the 70s on room air. Pt does not have oxygen at home. Pt denies SOB at rest but pt was 77% on room air during triage, 3L [place] applied and saturations in the 90s. Resp e.u at this time. Pt has new peritoneal catheter for dialysis that he has not started yet. Pt a.o

## 2019-04-19 ENCOUNTER — Inpatient Hospital Stay (HOSPITAL_COMMUNITY): Payer: Medicare Other

## 2019-04-19 ENCOUNTER — Encounter (HOSPITAL_COMMUNITY): Payer: Self-pay | Admitting: Cardiology

## 2019-04-19 DIAGNOSIS — I5031 Acute diastolic (congestive) heart failure: Secondary | ICD-10-CM

## 2019-04-19 DIAGNOSIS — I340 Nonrheumatic mitral (valve) insufficiency: Secondary | ICD-10-CM

## 2019-04-19 DIAGNOSIS — N186 End stage renal disease: Secondary | ICD-10-CM

## 2019-04-19 DIAGNOSIS — I361 Nonrheumatic tricuspid (valve) insufficiency: Secondary | ICD-10-CM

## 2019-04-19 LAB — GLUCOSE, CAPILLARY
Glucose-Capillary: 223 mg/dL — ABNORMAL HIGH (ref 70–99)
Glucose-Capillary: 247 mg/dL — ABNORMAL HIGH (ref 70–99)
Glucose-Capillary: 206 mg/dL — ABNORMAL HIGH (ref 70–99)
Glucose-Capillary: 214 mg/dL — ABNORMAL HIGH (ref 70–99)

## 2019-04-19 LAB — BASIC METABOLIC PANEL
Anion gap: 15 (ref 5–15)
Anion gap: 12 (ref 5–15)
BUN: 48 mg/dL — ABNORMAL HIGH (ref 8–23)
BUN: 44 mg/dL — ABNORMAL HIGH (ref 8–23)
CO2: 34 mmol/L — ABNORMAL HIGH (ref 22–32)
CO2: 34 mmol/L — ABNORMAL HIGH (ref 22–32)
Calcium: 8.3 mg/dL — ABNORMAL LOW (ref 8.9–10.3)
Calcium: 8.9 mg/dL (ref 8.9–10.3)
Chloride: 98 mmol/L (ref 98–111)
Chloride: 98 mmol/L (ref 98–111)
Creatinine, Ser: 4.01 mg/dL — ABNORMAL HIGH (ref 0.61–1.24)
Creatinine, Ser: 3.65 mg/dL — ABNORMAL HIGH (ref 0.61–1.24)
GFR calc Af Amer: 15 mL/min — ABNORMAL LOW (ref >60)
GFR calc Af Amer: 17 mL/min — ABNORMAL LOW (ref >60)
GFR calc non Af Amer: 13 mL/min — ABNORMAL LOW (ref >60)
GFR calc non Af Amer: 14 mL/min — ABNORMAL LOW (ref >60)
Glucose, Bld: 227 mg/dL — ABNORMAL HIGH (ref 70–99)
Glucose, Bld: 231 mg/dL — ABNORMAL HIGH (ref 70–99)
Potassium: 2.9 mmol/L — ABNORMAL LOW (ref 3.5–5.1)
Potassium: 3.3 mmol/L — ABNORMAL LOW (ref 3.5–5.1)
Sodium: 147 mmol/L — ABNORMAL HIGH (ref 135–145)
Sodium: 144 mmol/L (ref 135–145)

## 2019-04-19 LAB — PROTIME-INR
INR: 1.1 (ref 0.8–1.2)
Prothrombin Time: 14.3 seconds (ref 11.4–15.2)

## 2019-04-19 LAB — ECHOCARDIOGRAM COMPLETE
Height: 69 in
Weight: 2824 oz

## 2019-04-19 LAB — HEPARIN LEVEL (UNFRACTIONATED): Heparin Unfractionated: 0.23 IU/mL — ABNORMAL LOW (ref 0.30–0.70)

## 2019-04-19 LAB — LIPID PANEL
Cholesterol: 110 mg/dL (ref 0–200)
HDL: 42 mg/dL (ref >40)
LDL Cholesterol: 56 mg/dL (ref 0–99)
Total CHOL/HDL Ratio: 2.6 RATIO
Triglycerides: 59 mg/dL (ref <150)
VLDL: 12 mg/dL (ref 0–40)

## 2019-04-19 LAB — CBC
HCT: 33.2 % — ABNORMAL LOW (ref 39.0–52.0)
Hemoglobin: 10.5 g/dL — ABNORMAL LOW (ref 13.0–17.0)
MCH: 32 pg (ref 26.0–34.0)
MCHC: 31.6 g/dL (ref 30.0–36.0)
MCV: 101.2 fL — ABNORMAL HIGH (ref 80.0–100.0)
Platelets: 295 10*3/uL (ref 150–400)
RBC: 3.28 MIL/uL — ABNORMAL LOW (ref 4.22–5.81)
RDW: 14.1 % (ref 11.5–15.5)
WBC: 13.4 10*3/uL — ABNORMAL HIGH (ref 4.0–10.5)
nRBC: 0.2 % (ref 0.0–0.2)

## 2019-04-19 LAB — SARS CORONAVIRUS 2 (TAT 6-24 HRS): SARS Coronavirus 2: NEGATIVE

## 2019-04-19 MED ORDER — FUROSEMIDE 10 MG/ML IJ SOLN
80.0000 mg | Freq: Two times a day (BID) | INTRAMUSCULAR | Status: DC
  Administered 2019-04-19 – 2019-04-23 (×8): 80 mg via INTRAVENOUS
  Filled 2019-04-19 (×8): qty 8

## 2019-04-19 MED ORDER — POTASSIUM CHLORIDE CRYS ER 20 MEQ PO TBCR
40.0000 meq | EXTENDED_RELEASE_TABLET | Freq: Two times a day (BID) | ORAL | Status: AC
  Administered 2019-04-19: 40 meq via ORAL
  Administered 2019-04-19: 20 meq via ORAL
  Filled 2019-04-19 (×2): qty 2

## 2019-04-19 MED ORDER — ORAL CARE MOUTH RINSE
15.0000 mL | Freq: Two times a day (BID) | OROMUCOSAL | Status: DC
  Administered 2019-04-19 – 2019-04-25 (×12): 15 mL via OROMUCOSAL

## 2019-04-19 NOTE — Progress Notes (Signed)
ANTICOAGULATION CONSULT NOTE   Pharmacy Consult for heparin Indication: chest pain/ACS  Assessment: 64 YOM presenting with SOB, hx CAD s/p CABG, has PD cath but not using for PD yet.  Not on anticoagulation PTA, chronic anemia of ESRD stable.   Initial heparin level 0.23 units/ml.  No issue noted with infusion Goal of Therapy:  Heparin level 0.3-0.7 units/ml Monitor platelets by anticoagulation protocol: Yes   Plan:  Increase heparin to 1050 units/hr F/u 8 hour heparin level  Thanks for allowing pharmacy to be a part of this patient's care.  Excell Seltzer, PharmD Clinical Pharmacist  04/19/2019 5:05 AM

## 2019-04-19 NOTE — Progress Notes (Signed)
  Echocardiogram 2D Echocardiogram has been performed.  Jennette Dubin 04/19/2019, 12:32 PM

## 2019-04-19 NOTE — Plan of Care (Signed)

## 2019-04-19 NOTE — Progress Notes (Signed)
Progress Note  Patient Name: [name] Date of Encounter: 04/19/2019  Primary Cardiologist: Pixie Casino, MD   Subjective   Shortness of breath mildly improved but still not at baseline.  Inpatient Medications    Scheduled Meds: . amLODipine  10 mg Oral QHS  . aspirin EC  81 mg Oral Daily  . atorvastatin  40 mg Oral QHS  . carvedilol  3.125 mg Oral BID WC  . folic acid  1 mg Oral Daily  . hydrALAZINE  50 mg Oral TID  . insulin aspart  0-6 Units Subcutaneous TID WC  . mouth rinse  15 mL Mouth Rinse BID  . multivitamin with minerals  1 tablet Oral Daily  . potassium chloride  20 mEq Oral BID   Continuous Infusions: . cefTRIAXone (ROCEPHIN)  IV Stopped (04/19/19 0006)  . heparin 1,050 Units/hr (04/19/19 0530)   PRN Meds: acetaminophen, hydroxypropyl methylcellulose / hypromellose, nitroGLYCERIN, ondansetron (ZOFRAN) IV, traZODone   Vital Signs    Vitals:   04/18/19 2258 04/19/19 0001 04/19/19 0310 04/19/19 0727  BP:  (!) 132/56 (!) 151/71 140/63  Pulse: 82 70 71 73  Resp:  17 17 18   Temp:  98.7 F (37.1 C) 98.5 F (36.9 C) 98.8 F (37.1 C)  TempSrc:  Oral Oral Oral  SpO2: 94% 90% 90% 93%  Weight:   80.1 kg   Height:       No intake or output data in the 24 hours ending 04/19/19 1011 Last 3 Weights 04/19/2019 04/18/2019 04/18/2019  Weight (lbs) 176 lb 8 oz 174 lb 8 oz 167 lb 1.7 oz  Weight (kg) 80.06 kg 79.153 kg 75.8 kg      Telemetry    Sinus rhythm- Personally Reviewed  ECG    Sinus rhythm with somewhat diffuse ST segment depression, scooped T wave changes, more accentuated from prior EKGs- Personally Reviewed  Physical Exam   GEN: No acute distress.   Neck: No JVD Cardiac: RRR, 3/6 systolic murmur, no rubs, or gallops.  Respiratory: Clear to auscultation bilaterally. GI: Soft, nontender, non-distended  MS: No edema; No deformity. Neuro:  Nonfocal  Psych: Normal affect   Labs    High Sensitivity Troponin:   Recent Labs  Lab  04/18/19 1652 04/18/19 1841  TROPONINIHS 335* 349*      Chemistry Recent Labs  Lab 04/18/19 1652 04/19/19 0332  NA 142 147*  K 2.6* 2.9*  CL 89* 98  CO2 37* 34*  GLUCOSE 309* 227*  BUN 53* 48*  CREATININE 4.36* 4.01*  CALCIUM 8.7* 8.3*  GFRNONAA 12* 13*  GFRAA 14* 15*  ANIONGAP 16* 15     Hematology Recent Labs  Lab 04/18/19 1652 04/19/19 0332  WBC 12.4* 13.4*  RBC 3.58* 3.28*  HGB 11.4* 10.5*  HCT 36.7* 33.2*  MCV 102.5* 101.2*  MCH 31.8 32.0  MCHC 31.1 31.6  RDW 13.9 14.1  PLT 336 295    BNP Recent Labs  Lab 04/18/19 1652  BNP 1,406.6*     DDimer No results for input(s): DDIMER in the last 168 hours.   Radiology    DG Chest Portable 1 View  Result Date: 04/18/2019 CLINICAL DATA:  86 year old male with shortness of breath. EXAM: PORTABLE CHEST 1 VIEW COMPARISON:  Chest radiograph dated 08/09/2017 FINDINGS: There is a background of emphysema. Small right pleural effusion with associated right lung base atelectasis or infiltrate. There is diffuse interstitial and airspace density primarily involving the mid to lower lung fields which may  represent edema but concerning for pneumonia. Clinical correlation is recommended. No pneumothorax. There is mild cardiomegaly. Median sternotomy wires and CABG vascular clips. No acute osseous pathology. IMPRESSION: Small right pleural effusion with findings of edema, pneumonia or combination. Clinical correlation and follow-up recommended. Electronically Signed   By: Anner Crete M.D.   On: 04/18/2019 17:37    Cardiac Studies   02/28/2017 echocardiogram-EF 70%, mild aortic stenosis with mean gradient of 16 mmHg.  Patient Profile     86 y.o. male with chronic kidney disease stage IV type 2 diabetes prostate cancer hypertension coronary disease status post CABG with chronic diastolic heart failure admitted with shortness of breath, mildly elevated troponin III 35.  Covid negative.  Assessment & Plan    Acute  diastolic heart failure -Likely pulmonary edema on chest x-ray.  However pneumonia cannot be excluded.  He is receiving ceftriaxone.  Afebrile.  White blood cell count is slightly elevated at 13.4.  Covid negative. -I will go ahead and initiate IV Lasix 80 twice daily.  At home he is taking 80/40 p.o.  Coronary artery disease status post CABG -Cardiac catheterization 05/2017 stable.  Adequate management.  Hypokalemia/chronic kidney disease stage V -I will go ahead and write for him to get 40 mEq of potassium for 2 doses today and repeat basic metabolic profile this evening.  His potassium was 2.9.  His creatinine however is 4.  We will be careful with potassium administration.  I will go ahead and call [facility] to make sure that they are on board, possibly may need dialysis.  He has peritoneal dialysis catheter in place, inserted 04/08/2019.  Possible community-acquired pneumonia -On IV ceftriaxone.  Mildly elevated white count.  Afebrile.  Will consider discontinuing antibiotics tomorrow.  Elevated troponin -Low level 300.  Flat no chest pain.  Doubt ACS.  We will go ahead and discontinue IV heparin.  EKG does show some scooped ST segment depression fairly diffusely.  We will continue to monitor.  Could be electrolyte mediated possibly.    For questions or updates, please contact [place] Please consult [email or web address] for contact info under        Signed, Candee Furbish, MD  04/19/2019, 10:11 AM

## 2019-04-19 NOTE — Consult Note (Signed)
Reason for Consult: AKI/CKD stage 5 Referring Physician: Marlou Porch, MD  [name] is an 86 y.o. male.  HPI: [name] has a PMH significant for DM, HTN, h/o prostate cancer, CAD s/p CABG, chronic diastolic CHF and CKD stage V (planning to start Peritoneal dialysis on 05/05/19) who presented to [facility] ED with a 4 day history of worsening malaise, lower extremity edema and shortness of breath.  In the ED he was noted to have hypoxia, hypokalemia, and CXR consistent with pulmonary edema and small right pleural effusion.  His covid-19 test was negative but his Cr was above his baseline of 3-3.5 to 4.36.  He was admitted for acute on chronic CHF and we were consulted to help monitor her AKI/CKD stage V.  Of note, he had a PD catheter placed on 9/67/89 which was complicated by some post operative bleeding that improved after DDAVP.  Trend in Creatinine: Creatinine, Ser  Date/Time Value Ref Range Status  04/19/2019 03:32 AM 4.01 (H) 0.61 - 1.24 mg/dL Final  04/18/2019 04:52 PM 4.36 (H) 0.61 - 1.24 mg/dL Final  10/12/2017 11:26 AM 2.51 (H) 0.76 - 1.27 mg/dL Final  10/03/2017 03:11 PM 3.17 (H) 0.76 - 1.27 mg/dL Final  08/15/2017 05:20 AM 2.93 (H) 0.61 - 1.24 mg/dL Final  08/14/2017 05:18 AM 2.97 (H) 0.61 - 1.24 mg/dL Final  08/14/2017 05:18 AM 3.03 (H) 0.61 - 1.24 mg/dL Final  08/13/2017 05:02 AM 2.83 (H) 0.61 - 1.24 mg/dL Final  08/12/2017 03:17 PM 2.80 (H) 0.61 - 1.24 mg/dL Final  08/12/2017 07:10 AM 2.69 (H) 0.61 - 1.24 mg/dL Final  08/11/2017 03:12 PM 2.82 (H) 0.61 - 1.24 mg/dL Final  08/11/2017 04:32 AM 2.79 (H) 0.61 - 1.24 mg/dL Final  08/10/2017 04:13 AM 3.11 (H) 0.61 - 1.24 mg/dL Final  08/09/2017 04:29 AM 3.11 (H) 0.61 - 1.24 mg/dL Final  08/08/2017 05:35 PM 3.01 (H) 0.61 - 1.24 mg/dL Final  06/25/2017 10:25 AM 2.39 (H) 0.76 - 1.27 mg/dL Final  06/21/2017 03:57 AM 2.23 (H) 0.61 - 1.24 mg/dL Final  06/19/2017 09:40 AM 2.07 (H) 0.76 - 1.27 mg/dL Final  03/16/2017 08:45 AM 2.46 (H) 0.76 -  1.27 mg/dL Final  02/28/2017 08:54 AM 2.17 (H) 0.76 - 1.27 mg/dL Final  04/30/2009 08:35 AM 1.53 (H) 0.4 - 1.5 mg/dL Final  04/03/2007 08:29 PM 1.25  Final    PMH:   Past Medical History:  Diagnosis Date  . Arthritis    "a little in LLE" (06/20/2017)  . Chronic diastolic CHF (congestive heart failure) ([place])   . CKD (chronic kidney disease), stage IV ([place])   . Coronary artery disease    a. s/p CABG 1980s. b. stable by cath 05/2017.  Marland Kitchen Dyslipidemia   . Edema 08/08/2017   HANDS & LOWER EXTREMITES  . Hypertension   . Hypoalbuminemia   . Nephrotic range proteinuria   . Peripheral artery disease (HCC)    mild  . Pneumonia ~ 1950 X 1  . Prostate cancer ([place]) ~ 2015  . Type II diabetes mellitus (HCC)     PSH:   Past Surgical History:  Procedure Laterality Date  . CARDIAC CATHETERIZATION     "a couple times" (06/20/2017)  . CATARACT EXTRACTION W/ INTRAOCULAR LENS  IMPLANT, BILATERAL Bilateral   . CORONARY ARTERY BYPASS GRAFT  03/06/1985   LAD and first diagonal/circumflex (sequential saphenous vein graft,cardioplegia  . INSERTION PROSTATE RADIATION SEED  ~ 2015  . NM MYOCAR PERF WALL MOTION  09/12/2006   no ischemia  .  RIGHT/LEFT HEART CATH AND CORONARY/GRAFT ANGIOGRAPHY N/A 06/21/2017   Procedure: RIGHT/LEFT HEART CATH AND CORONARY/GRAFT ANGIOGRAPHY;  Surgeon: Burnell Blanks, MD;  Location: [facility];  Service: Cardiovascular;  Laterality: N/A;  . US ECHOCARDIOGRAPHY  11/04/2008   trace TR & MR    Allergies: No Known Allergies  Medications:   Prior to Admission medications   Medication Sig Start Date End Date Taking? Authorizing Provider  acetaminophen (TYLENOL) 650 MG CR tablet Take 650 mg by mouth at bedtime.   Yes [provider]  amLODipine (NORVASC) 10 MG tablet Take 10 mg by mouth at bedtime.   Yes [provider]  aspirin EC 81 MG tablet Take 81 mg by mouth daily.   Yes [provider]  atorvastatin (LIPITOR) 80 MG tablet Take  0.5 tablets (40 mg total) by mouth at bedtime. 04/03/19  Yes Kroeger, Daleen Snook M., PA-C  carboxymethylcellulose (REFRESH PLUS) 0.5 % SOLN Place 1 drop into both eyes 3 (three) times daily as needed (dry, itch).   Yes [provider]  carvedilol (COREG) 3.125 MG tablet TAKE 1 TABLET (3.125 MG TOTAL) BY MOUTH 2 (TWO) TIMES DAILY WITH A MEAL. 01/29/19  Yes HiltyNadean Corwin, MD  Cholecalciferol 25 MCG (1000 UT) tablet Take by mouth.   Yes [provider]  folic acid (FOLVITE) 295 MCG tablet Take 800 mcg by mouth daily.   Yes [provider]  furosemide (LASIX) 40 MG tablet Take 80 mg by mouth daily. 80 mg in the morning and 40 mg in the evening.    Yes [provider]  glimepiride (AMARYL) 4 MG tablet Take 4 mg by mouth daily with breakfast.    Yes [provider]  hydrALAZINE (APRESOLINE) 100 MG tablet Take 100 mg by mouth 3 (three) times daily.   Yes [provider]  Multiple Vitamin (MULTIVITAMIN) tablet Take 1 tablet by mouth daily.    Yes [provider]  OVER THE COUNTER MEDICATION Place 1 spray into the nose daily as needed. Sovereign Silver Nasal Spray for Immune Support   Yes [provider]  OVER THE COUNTER MEDICATION Take 1 capsule by mouth every morning.   Yes [provider]  TRADJENTA 5 MG TABS tablet Take 5 mg by mouth every morning.  02/27/17  Yes [provider]  traZODone (DESYREL) 50 MG tablet Take 50 mg by mouth at bedtime as needed for sleep.  07/12/17  Yes [provider]    Inpatient medications: . amLODipine  10 mg Oral QHS  . aspirin EC  81 mg Oral Daily  . atorvastatin  40 mg Oral QHS  . carvedilol  3.125 mg Oral BID WC  . folic acid  1 mg Oral Daily  . furosemide  80 mg Intravenous BID  . hydrALAZINE  50 mg Oral TID  . insulin aspart  0-6 Units Subcutaneous TID WC  . mouth rinse  15 mL Mouth Rinse BID  . multivitamin with minerals  1 tablet Oral Daily  . potassium chloride  40  mEq Oral BID    Discontinued Meds:   Medications Discontinued During This Encounter  Medication Reason  . Multiple Vitamins-Minerals (MULTIVITAMIN ADULTS PO) Patient Preference  . potassium chloride (KLOR-CON) CR tablet 20 mEq   . heparin ADULT infusion 100 units/mL (25000 units/247mL sodium chloride 0.45%)     Social History:  reports that he quit smoking about 46 years ago. His smoking use included cigarettes. He has a 20.00 pack-year smoking history.  He has never used smokeless tobacco. He reports previous alcohol use. He reports that he does not use drugs.  Family History:   Family History  Problem Relation Age of Onset  . Heart attack Mother     Pertinent items are noted in HPI. Weight change:   Intake/Output Summary (Last 24 hours) at 04/19/2019 1438 Last data filed at 04/19/2019 1324 Gross per 24 hour  Intake 1128 ml  Output 600 ml  Net 528 ml   BP (!) 142/70   Pulse 75   Temp 98.1 F (36.7 C) (Oral)   Resp 18   Ht 5\' 9"  (1.753 m)   Wt 80.1 kg Comment: scale c  SpO2 91%   BMI 26.06 kg/m  Vitals:   04/19/19 0001 04/19/19 0310 04/19/19 0727 04/19/19 1120  BP: (!) 132/56 (!) 151/71 140/63 (!) 142/70  Pulse: 70 71 73 75  Resp: 17 17 18 18   Temp: 98.7 F (37.1 C) 98.5 F (36.9 C) 98.8 F (37.1 C) 98.1 F (36.7 C)  TempSrc: Oral Oral Oral Oral  SpO2: 90% 90% 93% 91%  Weight:  80.1 kg    Height:         General appearance: alert, cooperative and no distress Head: Normocephalic, without obvious abnormality, atraumatic Resp: rales bibasilar Cardio: regular rate and rhythm, no rub and III/VI SEM GI: soft, non-tender; bowel sounds normal; no masses,  no organomegaly, PD catheter in RLQ, nontender and no drainage Extremities: edema 1+ bilateral lower extremities Neuro: no asterixis  Labs: Basic Metabolic Panel: Recent Labs  Lab 04/18/19 1652 04/19/19 0332  NA 142 147*  K 2.6* 2.9*  CL 89* 98  CO2 37* 34*  GLUCOSE 309* 227*  BUN 53* 48*  CREATININE  4.36* 4.01*  CALCIUM 8.7* 8.3*   Liver Function Tests: No results for input(s): AST, ALT, ALKPHOS, BILITOT, PROT, ALBUMIN in the last 168 hours. No results for input(s): LIPASE, AMYLASE in the last 168 hours. No results for input(s): AMMONIA in the last 168 hours. CBC: Recent Labs  Lab 04/18/19 1652 04/19/19 0332  WBC 12.4* 13.4*  NEUTROABS 9.9*  --   HGB 11.4* 10.5*  HCT 36.7* 33.2*  MCV 102.5* 101.2*  PLT 336 295   PT/INR: @LABRCNTIP (inr:5) Cardiac Enzymes: )No results for input(s): CKTOTAL, CKMB, CKMBINDEX, TROPONINI in the last 168 hours. CBG: Recent Labs  Lab 04/18/19 2056 04/19/19 0617 04/19/19 1119  GLUCAP 244* 223* 247*    Iron Studies: No results for input(s): IRON, TIBC, TRANSFERRIN, FERRITIN in the last 168 hours.  Xrays/Other Studies: DG Chest Portable 1 View  Result Date: 04/18/2019 CLINICAL DATA:  86 year old male with shortness of breath. EXAM: PORTABLE CHEST 1 VIEW COMPARISON:  Chest radiograph dated 08/09/2017 FINDINGS: There is a background of emphysema. Small right pleural effusion with associated right lung base atelectasis or infiltrate. There is diffuse interstitial and airspace density primarily involving the mid to lower lung fields which may represent edema but concerning for pneumonia. Clinical correlation is recommended. No pneumothorax. There is mild cardiomegaly. Median sternotomy wires and CABG vascular clips. No acute osseous pathology. IMPRESSION: Small right pleural effusion with findings of edema, pneumonia or combination. Clinical correlation and follow-up recommended. Electronically Signed   By: Anner Crete M.D.   On: 04/18/2019 17:37     Assessment/Plan: 1.  AKI/CKD stage V- in setting of decompensated CHF and diuresis.  His Cr is slightly improved from yesterday but still above his baseline.  He is without uremic symptoms and is planning to  start PD training on 05/05/19.  He has an appointment with Dr. Hollie Salk for follow up on 04/24/19.   Hopefully we can avoid initiating dialysis while in the hospital. 2. Acute diastolic CHF- improving with IV lasix 3. Acute hypoxic respiratory failure- thought to be due to pulmonary edema but pneumonia cannot be ruled out so on ceftriaxone per Cardiology 4. CAD s/p CABG- stable 5. Hypokalemia- replete and follow. 6. Anemia of CKD- follow and hold ESA for now.  7. HTN- stable 8. DM per primary.   Governor Rooks Sinia Antosh 04/19/2019, 2:38 PM

## 2019-04-20 LAB — GLUCOSE, CAPILLARY
Glucose-Capillary: 180 mg/dL — ABNORMAL HIGH (ref 70–99)
Glucose-Capillary: 231 mg/dL — ABNORMAL HIGH (ref 70–99)
Glucose-Capillary: 227 mg/dL — ABNORMAL HIGH (ref 70–99)
Glucose-Capillary: 291 mg/dL — ABNORMAL HIGH (ref 70–99)

## 2019-04-20 LAB — RENAL FUNCTION PANEL
Albumin: 2.6 g/dL — ABNORMAL LOW (ref 3.5–5.0)
Anion gap: 11 (ref 5–15)
BUN: 43 mg/dL — ABNORMAL HIGH (ref 8–23)
CO2: 33 mmol/L — ABNORMAL HIGH (ref 22–32)
Calcium: 8.7 mg/dL — ABNORMAL LOW (ref 8.9–10.3)
Chloride: 102 mmol/L (ref 98–111)
Creatinine, Ser: 3.67 mg/dL — ABNORMAL HIGH (ref 0.61–1.24)
GFR calc Af Amer: 17 mL/min — ABNORMAL LOW (ref >60)
GFR calc non Af Amer: 14 mL/min — ABNORMAL LOW (ref >60)
Glucose, Bld: 203 mg/dL — ABNORMAL HIGH (ref 70–99)
Phosphorus: 2.9 mg/dL (ref 2.5–4.6)
Potassium: 3.4 mmol/L — ABNORMAL LOW (ref 3.5–5.1)
Sodium: 146 mmol/L — ABNORMAL HIGH (ref 135–145)

## 2019-04-20 LAB — CBC
HCT: 35 % — ABNORMAL LOW (ref 39.0–52.0)
Hemoglobin: 10.9 g/dL — ABNORMAL LOW (ref 13.0–17.0)
MCH: 31.7 pg (ref 26.0–34.0)
MCHC: 31.1 g/dL (ref 30.0–36.0)
MCV: 101.7 fL — ABNORMAL HIGH (ref 80.0–100.0)
Platelets: 323 10*3/uL (ref 150–400)
RBC: 3.44 MIL/uL — ABNORMAL LOW (ref 4.22–5.81)
RDW: 14.2 % (ref 11.5–15.5)
WBC: 14 10*3/uL — ABNORMAL HIGH (ref 4.0–10.5)
nRBC: 0.2 % (ref 0.0–0.2)

## 2019-04-20 MED ORDER — BISACODYL 5 MG PO TBEC
5.0000 mg | DELAYED_RELEASE_TABLET | Freq: Every day | ORAL | Status: DC | PRN
  Administered 2019-04-20: 5 mg via ORAL
  Filled 2019-04-20: qty 1

## 2019-04-20 MED ORDER — SODIUM CHLORIDE 0.9 % IV SOLN
20.0000 ug | Freq: Once | INTRAVENOUS | Status: AC
  Administered 2019-04-20: 20 ug via INTRAVENOUS
  Filled 2019-04-20: qty 5

## 2019-04-20 MED ORDER — POTASSIUM CHLORIDE CRYS ER 20 MEQ PO TBCR
40.0000 meq | EXTENDED_RELEASE_TABLET | Freq: Two times a day (BID) | ORAL | Status: AC
  Administered 2019-04-20 (×2): 40 meq via ORAL
  Filled 2019-04-20 (×2): qty 2

## 2019-04-20 MED ORDER — DOCUSATE SODIUM 100 MG PO CAPS: 100.0000 mg | ORAL_CAPSULE | Freq: Every day | ORAL | Status: DC | PRN

## 2019-04-20 MED ORDER — INSULIN ASPART 100 UNIT/ML ~~LOC~~ SOLN
0.0000 [IU] | Freq: Three times a day (TID) | SUBCUTANEOUS | Status: DC
  Administered 2019-04-21: 4 [IU] via SUBCUTANEOUS
  Administered 2019-04-21: 1 [IU] via SUBCUTANEOUS
  Administered 2019-04-22: 2 [IU] via SUBCUTANEOUS
  Administered 2019-04-22 (×2): 1 [IU] via SUBCUTANEOUS

## 2019-04-20 MED ORDER — INSULIN ASPART 100 UNIT/ML ~~LOC~~ SOLN
0.0000 [IU] | Freq: Every day | SUBCUTANEOUS | Status: DC
  Administered 2019-04-20: 3 [IU] via SUBCUTANEOUS

## 2019-04-20 NOTE — Progress Notes (Signed)
Progress Note  Patient Name: [name] Date of Encounter: 04/20/2019  Primary Cardiologist: Pixie Casino, MD   Subjective   Admitted Friday evening by Dr. Doylene Canard.  Transfer to our service on Saturday.  Overall improved.  Breathing is improved but still not at baseline.  Mild temperature of 100.4 overnight.  Inpatient Medications    Scheduled Meds: . amLODipine  10 mg Oral QHS  . aspirin EC  81 mg Oral Daily  . atorvastatin  40 mg Oral QHS  . carvedilol  3.125 mg Oral BID WC  . folic acid  1 mg Oral Daily  . furosemide  80 mg Intravenous BID  . hydrALAZINE  50 mg Oral TID  . insulin aspart  0-6 Units Subcutaneous TID WC  . mouth rinse  15 mL Mouth Rinse BID  . multivitamin with minerals  1 tablet Oral Daily   Continuous Infusions: . cefTRIAXone (ROCEPHIN)  IV Stopped (04/19/19 2255)   PRN Meds: acetaminophen, hydroxypropyl methylcellulose / hypromellose, nitroGLYCERIN, ondansetron (ZOFRAN) IV, traZODone   Vital Signs    Vitals:   04/19/19 1920 04/20/19 0045 04/20/19 0332 04/20/19 0840  BP: (!) 157/95 140/72 (!) 143/65 (!) 159/78  Pulse: 70 76 72 72  Resp: 19 18 17    Temp: (!) 100.4 F (38 C) 98.4 F (36.9 C) 98.1 F (36.7 C)   TempSrc: Oral Oral Oral   SpO2: 90% 91% 90% (!) 87%  Weight:   79.2 kg   Height:        Intake/Output Summary (Last 24 hours) at 04/20/2019 1004 Last data filed at 04/20/2019 0900 Gross per 24 hour  Intake 1676 ml  Output 2700 ml  Net -1024 ml   Last 3 Weights 04/20/2019 04/19/2019 04/18/2019  Weight (lbs) 174 lb 9.6 oz 176 lb 8 oz 174 lb 8 oz  Weight (kg) 79.198 kg 80.06 kg 79.153 kg      Telemetry    Sinus rhythm, no changes- Personally Reviewed  ECG    Sinus rhythm with somewhat diffuse ST segment depression, scooped T wave changes, more accentuated from prior EKGs- Personally Reviewed  Physical Exam   GEN: Well nourished, well developed, in no acute distress  HEENT: normal  Neck: no JVD, carotid bruits, or  masses Cardiac: RRR; 3/6 systolic murmur, no rubs, or gallops,no edema  Respiratory:  clear to auscultation bilaterally, normal work of breathing GI: soft, nontender, nondistended, + BS, peritoneal dialysis catheter in place MS: no deformity or atrophy  Skin: warm and dry, no rash Neuro:  Alert and Oriented x 3, Strength and sensation are intact Psych: euthymic mood, full affect   Labs    High Sensitivity Troponin:   Recent Labs  Lab 04/18/19 1652 04/18/19 1841  TROPONINIHS 335* 349*      Chemistry Recent Labs  Lab 04/19/19 0332 04/19/19 1955 04/20/19 0428  NA 147* 144 146*  K 2.9* 3.3* 3.4*  CL 98 98 102  CO2 34* 34* 33*  GLUCOSE 227* 231* 203*  BUN 48* 44* 43*  CREATININE 4.01* 3.65* 3.67*  CALCIUM 8.3* 8.9 8.7*  ALBUMIN  --   --  2.6*  GFRNONAA 13* 14* 14*  GFRAA 15* 17* 17*  ANIONGAP 15 12 11      Hematology Recent Labs  Lab 04/18/19 1652 04/19/19 0332 04/20/19 0428  WBC 12.4* 13.4* 14.0*  RBC 3.58* 3.28* 3.44*  HGB 11.4* 10.5* 10.9*  HCT 36.7* 33.2* 35.0*  MCV 102.5* 101.2* 101.7*  MCH 31.8 32.0 31.7  MCHC  31.1 31.6 31.1  RDW 13.9 14.1 14.2  PLT 336 295 323    BNP Recent Labs  Lab 04/18/19 1652  BNP 1,406.6*     DDimer No results for input(s): DDIMER in the last 168 hours.   Radiology    DG Chest Portable 1 View  Result Date: 04/18/2019 CLINICAL DATA:  86 year old male with shortness of breath. EXAM: PORTABLE CHEST 1 VIEW COMPARISON:  Chest radiograph dated 08/09/2017 FINDINGS: There is a background of emphysema. Small right pleural effusion with associated right lung base atelectasis or infiltrate. There is diffuse interstitial and airspace density primarily involving the mid to lower lung fields which may represent edema but concerning for pneumonia. Clinical correlation is recommended. No pneumothorax. There is mild cardiomegaly. Median sternotomy wires and CABG vascular clips. No acute osseous pathology. IMPRESSION: Small right pleural  effusion with findings of edema, pneumonia or combination. Clinical correlation and follow-up recommended. Electronically Signed   By: Anner Crete M.D.   On: 04/18/2019 17:37   ECHOCARDIOGRAM COMPLETE  Result Date: 04/19/2019    ECHOCARDIOGRAM REPORT   Patient Name:   [name] Date of Exam: 04/19/2019 Medical Rec #:  [id number]       Height:       69.0 in Accession #:    [id number]      Weight:       176.5 lb Date of Birth:  Apr 01, 1933        BSA:          1.959 m Patient Age:    86 years        BP:           142/70 mmHg Patient Gender: M               HR:           75 bpm. Exam Location:  Inpatient Procedure: 2D Echo Indications:    Acute Coronary Syndrome I24.9  History:        Patient has prior history of Echocardiogram examinations, most                 recent 02/28/2017. CHF, CAD, Prior CABG; Risk Factors:Diabetes,                 Hypertension and Dyslipidemia.  Sonographer:    Mikki Santee RDCS (AE) Referring Phys: [place]  1. Since last echo, estimated RV pressure has increased. Aortic stenosis is unchanged.  2. Left ventricular ejection fraction, by estimation, is 60 to 65%. The left ventricle has normal function. The left ventricle has no regional wall motion abnormalities. There is mild left ventricular hypertrophy. Left ventricular diastolic parameters are consistent with Grade II diastolic dysfunction (pseudonormalization). Elevated left atrial pressure.  3. Right ventricular systolic function is normal. The right ventricular size is mildly enlarged. There is severely elevated pulmonary artery systolic pressure. The estimated right ventricular systolic pressure is 91.6 mmHg.  4. Left atrial size was mild to moderately dilated.  5. Right atrial size was moderately dilated.  6. The mitral valve is normal in structure and function. Mild mitral valve regurgitation. No evidence of mitral stenosis.  7. Tricuspid valve regurgitation is moderate to severe.  8. The aortic  valve is tricuspid. Aortic valve regurgitation is not visualized. Mild to moderate aortic valve stenosis. Aortic valve area, by VTI measures 1.18 cm. Aortic valve mean gradient measures 17.0 mmHg. Aortic valve Vmax measures 2.84 m/s.  9. The inferior vena cava is dilated in  size with >50% respiratory variability, suggesting right atrial pressure of 8 mmHg. FINDINGS  Left Ventricle: Left ventricular ejection fraction, by estimation, is 60 to 65%. The left ventricle has normal function. The left ventricle has no regional wall motion abnormalities. The left ventricular internal cavity size was normal in size. There is  mild left ventricular hypertrophy. Left ventricular diastolic parameters are consistent with Grade II diastolic dysfunction (pseudonormalization). Elevated left atrial pressure. Right Ventricle: The right ventricular size is mildly enlarged. No increase in right ventricular wall thickness. Right ventricular systolic function is normal. There is severely elevated pulmonary artery systolic pressure. The tricuspid regurgitant velocity is 4.18 m/s, and with an assumed right atrial pressure of 8 mmHg, the estimated right ventricular systolic pressure is 16.1 mmHg. Left Atrium: Left atrial size was mild to moderately dilated. Right Atrium: Right atrial size was moderately dilated. Pericardium: There is no evidence of pericardial effusion. Mitral Valve: The mitral valve is normal in structure and function. Normal mobility of the mitral valve leaflets. Mild mitral valve regurgitation. No evidence of mitral valve stenosis. Tricuspid Valve: The tricuspid valve is normal in structure. Tricuspid valve regurgitation is moderate to severe. No evidence of tricuspid stenosis. Aortic Valve: The aortic valve is tricuspid. . There is moderate thickening and moderate calcification of the aortic valve. Aortic valve regurgitation is not visualized. Mild to moderate aortic stenosis is present. There is moderate thickening of  the aortic valve. There is moderate calcification of the aortic valve. Aortic valve mean gradient measures 17.0 mmHg. Aortic valve peak gradient measures 32.3 mmHg. Aortic valve area, by VTI measures 1.18 cm. Pulmonic Valve: The pulmonic valve was normal in structure. Pulmonic valve regurgitation is not visualized. No evidence of pulmonic stenosis. Aorta: The aortic root is normal in size and structure. Venous: The inferior vena cava is dilated in size with greater than 50% respiratory variability, suggesting right atrial pressure of 8 mmHg. IAS/Shunts: No atrial level shunt detected by color flow Doppler. Additional Comments: Since last echo, estimated RV pressure has increased. Aortic stenosis is unchanged.  LEFT VENTRICLE PLAX 2D LVIDd:         4.80 cm  Diastology LVIDs:         3.50 cm  LV e' lateral:   6.09 cm/s LV PW:         1.20 cm  LV E/e' lateral: 24.5 LV IVS:        1.20 cm  LV e' medial:    4.46 cm/s LVOT diam:     2.10 cm  LV E/e' medial:  33.4 LV SV:         80.36 ml LV SV Index:   41.02 LVOT Area:     3.46 cm  RIGHT VENTRICLE RV S prime:     8.38 cm/s TAPSE (M-mode): 2.1 cm LEFT ATRIUM             Index       RIGHT ATRIUM           Index LA diam:        4.60 cm 2.35 cm/m  RA Area:     18.20 cm LA Vol (A2C):   82.1 ml 41.91 ml/m RA Volume:   49.20 ml  25.11 ml/m LA Vol (A4C):   65.0 ml 33.18 ml/m LA Biplane Vol: 79.1 ml 40.38 ml/m  AORTIC VALVE AV Area (Vmax):    1.43 cm AV Area (Vmean):   1.30 cm AV Area (VTI):     1.18 cm AV  Vmax:           284.00 cm/s AV Vmean:          200.000 cm/s AV VTI:            0.680 m AV Peak Grad:      32.3 mmHg AV Mean Grad:      17.0 mmHg LVOT Vmax:         117.00 cm/s LVOT Vmean:        75.200 cm/s LVOT VTI:          0.232 m LVOT/AV VTI ratio: 0.34  AORTA Ao Root diam: 3.20 cm MITRAL VALVE                TRICUSPID VALVE MV Area (PHT): 3.27 cm     TR Peak grad:   69.9 mmHg MV Decel Time: 232 msec     TR Vmax:        418.00 cm/s MR Peak grad: 126.3 mmHg MR Mean  grad: 84.0 mmHg     SHUNTS MR Vmax:      562.00 cm/s   Systemic VTI:  0.23 m MR Vmean:     439.0 cm/s    Systemic Diam: 2.10 cm MV E velocity: 149.00 cm/s MV A velocity: 141.00 cm/s MV E/A ratio:  1.06 Candee Furbish MD Electronically signed by Candee Furbish MD Signature Date/Time: 04/19/2019/4:15:51 PM    Final     Cardiac Studies   02/28/2017 echocardiogram-EF 70%, mild aortic stenosis with mean gradient of 16 mmHg.  Patient Profile     86 y.o. male with chronic kidney disease stage IV type 2 diabetes prostate cancer hypertension coronary disease status post CABG with chronic diastolic heart failure admitted with shortness of breath, mildly elevated troponin 335.  Covid negative.  Assessment & Plan    Acute diastolic heart failure -Likely pulmonary edema on chest x-ray.  However pneumonia cannot be excluded.  He is receiving ceftriaxone.  Did have a mild temperature 100.4 overnight, continue with ceftriaxone.  White blood cell count is slightly elevated at 14.  Covid negative. -IV Lasix 80 twice daily.  At home he is taking 80/40 p.o. 2.7 L of urine output yesterday.  Continue.  Coronary artery disease status post CABG -Cardiac catheterization 05/2017 stable.  Adequate management.  No chest pain.  Hypokalemia/chronic kidney disease stage V - 40 mEq of potassium for 2 doses again today and repeat basic metabolic tomorrow morning.  His potassium was 3.4 2.9.  His creatinine however is 3.67.  We will be careful with potassium administration.  He has peritoneal dialysis catheter in place, inserted 04/08/2019.  [place] [facility], Dr. Marval Regal   Possible community-acquired pneumonia -On IV ceftriaxone.  Mildly elevated white count.  Febrile.   Elevated troponin -Low level 300.  Flat no chest pain.  Doubt ACS.  We have discontinued IV heparin.  EKG does show some scooped ST segment depression fairly diffusely.  We will continue to monitor.  Could be electrolyte mediated possibly.    For  questions or updates, please contact [place] Please consult [email or web address] for contact info under        Signed, Candee Furbish, MD  04/20/2019, 10:04 AM

## 2019-04-20 NOTE — Progress Notes (Signed)
Patient ID: [name], male   DOB: 30-Oct-1933, 86 y.o.   MRN: [id number] S: Feeling a little better today. O:BP (!) 143/69 (BP Location: Right Arm)   Pulse 63   Temp 98.7 F (37.1 C) (Oral)   Resp 17   Ht 5\' 9"  (1.753 m)   Wt 79.2 kg Comment: scale c  SpO2 99%   BMI 25.78 kg/m   Intake/Output Summary (Last 24 hours) at 04/20/2019 1246 Last data filed at 04/20/2019 0900 Gross per 24 hour  Intake 1454 ml  Output 2100 ml  Net -646 ml   Intake/Output: I/O last 3 completed shifts: In: 1778 [P.O.:1778] Out: 2700 [Urine:2700]  Intake/Output this shift:  Total I/O In: 240 [P.O.:240] Out: -  Weight change: 3.398 kg Gen: NAD CVS: no rub Resp: cta Abd: +BS, soft, NT/ND, had some bleeding at PD catheter exit site and saturated bandages Ext: 1+ edema  Recent Labs  Lab 04/18/19 1652 04/19/19 0332 04/19/19 1955 04/20/19 0428  NA 142 147* 144 146*  K 2.6* 2.9* 3.3* 3.4*  CL 89* 98 98 102  CO2 37* 34* 34* 33*  GLUCOSE 309* 227* 231* 203*  BUN 53* 48* 44* 43*  CREATININE 4.36* 4.01* 3.65* 3.67*  ALBUMIN  --   --   --  2.6*  CALCIUM 8.7* 8.3* 8.9 8.7*  PHOS  --   --   --  2.9   Liver Function Tests: Recent Labs  Lab 04/20/19 0428  ALBUMIN 2.6*   No results for input(s): LIPASE, AMYLASE in the last 168 hours. No results for input(s): AMMONIA in the last 168 hours. CBC: Recent Labs  Lab 04/18/19 1652 04/19/19 0332 04/20/19 0428  WBC 12.4* 13.4* 14.0*  NEUTROABS 9.9*  --   --   HGB 11.4* 10.5* 10.9*  HCT 36.7* 33.2* 35.0*  MCV 102.5* 101.2* 101.7*  PLT 336 295 323   Cardiac Enzymes: No results for input(s): CKTOTAL, CKMB, CKMBINDEX, TROPONINI in the last 168 hours. CBG: Recent Labs  Lab 04/19/19 1119 04/19/19 1601 04/19/19 2120 04/20/19 0618 04/20/19 1146  GLUCAP 247* 206* 214* 180* 231*    Iron Studies: No results for input(s): IRON, TIBC, TRANSFERRIN, FERRITIN in the last 72 hours. Studies/Results: DG Chest Portable 1 View  Result Date:  04/18/2019 CLINICAL DATA:  86 year old male with shortness of breath. EXAM: PORTABLE CHEST 1 VIEW COMPARISON:  Chest radiograph dated 08/09/2017 FINDINGS: There is a background of emphysema. Small right pleural effusion with associated right lung base atelectasis or infiltrate. There is diffuse interstitial and airspace density primarily involving the mid to lower lung fields which may represent edema but concerning for pneumonia. Clinical correlation is recommended. No pneumothorax. There is mild cardiomegaly. Median sternotomy wires and CABG vascular clips. No acute osseous pathology. IMPRESSION: Small right pleural effusion with findings of edema, pneumonia or combination. Clinical correlation and follow-up recommended. Electronically Signed   By: Anner Crete M.D.   On: 04/18/2019 17:37   ECHOCARDIOGRAM COMPLETE  Result Date: 04/19/2019    ECHOCARDIOGRAM REPORT   Patient Name:   [name] Date of Exam: 04/19/2019 Medical Rec #:  [id number]       Height:       69.0 in Accession #:    [id number]      Weight:       176.5 lb Date of Birth:  [birth year]        BSA:          1.959 m Patient Age:  85 years        BP:           142/70 mmHg Patient Gender: M               HR:           75 bpm. Exam Location:  Inpatient Procedure: 2D Echo Indications:    Acute Coronary Syndrome I24.9  History:        Patient has prior history of Echocardiogram examinations, most                 recent 02/28/2017. CHF, CAD, Prior CABG; Risk Factors:Diabetes,                 Hypertension and Dyslipidemia.  Sonographer:    Mikki Santee RDCS (AE) Referring Phys: [place]  1. Since last echo, estimated RV pressure has increased. Aortic stenosis is unchanged.  2. Left ventricular ejection fraction, by estimation, is 60 to 65%. The left ventricle has normal function. The left ventricle has no regional wall motion abnormalities. There is mild left ventricular hypertrophy. Left ventricular diastolic parameters  are consistent with Grade II diastolic dysfunction (pseudonormalization). Elevated left atrial pressure.  3. Right ventricular systolic function is normal. The right ventricular size is mildly enlarged. There is severely elevated pulmonary artery systolic pressure. The estimated right ventricular systolic pressure is 58.3 mmHg.  4. Left atrial size was mild to moderately dilated.  5. Right atrial size was moderately dilated.  6. The mitral valve is normal in structure and function. Mild mitral valve regurgitation. No evidence of mitral stenosis.  7. Tricuspid valve regurgitation is moderate to severe.  8. The aortic valve is tricuspid. Aortic valve regurgitation is not visualized. Mild to moderate aortic valve stenosis. Aortic valve area, by VTI measures 1.18 cm. Aortic valve mean gradient measures 17.0 mmHg. Aortic valve Vmax measures 2.84 m/s.  9. The inferior vena cava is dilated in size with >50% respiratory variability, suggesting right atrial pressure of 8 mmHg. FINDINGS  Left Ventricle: Left ventricular ejection fraction, by estimation, is 60 to 65%. The left ventricle has normal function. The left ventricle has no regional wall motion abnormalities. The left ventricular internal cavity size was normal in size. There is  mild left ventricular hypertrophy. Left ventricular diastolic parameters are consistent with Grade II diastolic dysfunction (pseudonormalization). Elevated left atrial pressure. Right Ventricle: The right ventricular size is mildly enlarged. No increase in right ventricular wall thickness. Right ventricular systolic function is normal. There is severely elevated pulmonary artery systolic pressure. The tricuspid regurgitant velocity is 4.18 m/s, and with an assumed right atrial pressure of 8 mmHg, the estimated right ventricular systolic pressure is 09.4 mmHg. Left Atrium: Left atrial size was mild to moderately dilated. Right Atrium: Right atrial size was moderately dilated. Pericardium:  There is no evidence of pericardial effusion. Mitral Valve: The mitral valve is normal in structure and function. Normal mobility of the mitral valve leaflets. Mild mitral valve regurgitation. No evidence of mitral valve stenosis. Tricuspid Valve: The tricuspid valve is normal in structure. Tricuspid valve regurgitation is moderate to severe. No evidence of tricuspid stenosis. Aortic Valve: The aortic valve is tricuspid. . There is moderate thickening and moderate calcification of the aortic valve. Aortic valve regurgitation is not visualized. Mild to moderate aortic stenosis is present. There is moderate thickening of the aortic valve. There is moderate calcification of the aortic valve. Aortic valve mean gradient measures 17.0 mmHg. Aortic valve peak gradient measures  32.3 mmHg. Aortic valve area, by VTI measures 1.18 cm. Pulmonic Valve: The pulmonic valve was normal in structure. Pulmonic valve regurgitation is not visualized. No evidence of pulmonic stenosis. Aorta: The aortic root is normal in size and structure. Venous: The inferior vena cava is dilated in size with greater than 50% respiratory variability, suggesting right atrial pressure of 8 mmHg. IAS/Shunts: No atrial level shunt detected by color flow Doppler. Additional Comments: Since last echo, estimated RV pressure has increased. Aortic stenosis is unchanged.  LEFT VENTRICLE PLAX 2D LVIDd:         4.80 cm  Diastology LVIDs:         3.50 cm  LV e' lateral:   6.09 cm/s LV PW:         1.20 cm  LV E/e' lateral: 24.5 LV IVS:        1.20 cm  LV e' medial:    4.46 cm/s LVOT diam:     2.10 cm  LV E/e' medial:  33.4 LV SV:         80.36 ml LV SV Index:   41.02 LVOT Area:     3.46 cm  RIGHT VENTRICLE RV S prime:     8.38 cm/s TAPSE (M-mode): 2.1 cm LEFT ATRIUM             Index       RIGHT ATRIUM           Index LA diam:        4.60 cm 2.35 cm/m  RA Area:     18.20 cm LA Vol (A2C):   82.1 ml 41.91 ml/m RA Volume:   49.20 ml  25.11 ml/m LA Vol (A4C):    65.0 ml 33.18 ml/m LA Biplane Vol: 79.1 ml 40.38 ml/m  AORTIC VALVE AV Area (Vmax):    1.43 cm AV Area (Vmean):   1.30 cm AV Area (VTI):     1.18 cm AV Vmax:           284.00 cm/s AV Vmean:          200.000 cm/s AV VTI:            0.680 m AV Peak Grad:      32.3 mmHg AV Mean Grad:      17.0 mmHg LVOT Vmax:         117.00 cm/s LVOT Vmean:        75.200 cm/s LVOT VTI:          0.232 m LVOT/AV VTI ratio: 0.34  AORTA Ao Root diam: 3.20 cm MITRAL VALVE                TRICUSPID VALVE MV Area (PHT): 3.27 cm     TR Peak grad:   69.9 mmHg MV Decel Time: 232 msec     TR Vmax:        418.00 cm/s MR Peak grad: 126.3 mmHg MR Mean grad: 84.0 mmHg     SHUNTS MR Vmax:      562.00 cm/s   Systemic VTI:  0.23 m MR Vmean:     439.0 cm/s    Systemic Diam: 2.10 cm MV E velocity: 149.00 cm/s MV A velocity: 141.00 cm/s MV E/A ratio:  1.06 Candee Furbish MD Electronically signed by Candee Furbish MD Signature Date/Time: 04/19/2019/4:15:51 PM    Final    . amLODipine  10 mg Oral QHS  . aspirin EC  81 mg Oral Daily  . atorvastatin  40  mg Oral QHS  . carvedilol  3.125 mg Oral BID WC  . folic acid  1 mg Oral Daily  . furosemide  80 mg Intravenous BID  . hydrALAZINE  50 mg Oral TID  . insulin aspart  0-6 Units Subcutaneous TID WC  . mouth rinse  15 mL Mouth Rinse BID  . multivitamin with minerals  1 tablet Oral Daily  . potassium chloride  40 mEq Oral BID    BMET    Component Value Date/Time   NA 146 (H) 04/20/2019 0428   NA 143 10/12/2017 1126   K 3.4 (L) 04/20/2019 0428   CL 102 04/20/2019 0428   CO2 33 (H) 04/20/2019 0428   GLUCOSE 203 (H) 04/20/2019 0428   BUN 43 (H) 04/20/2019 0428   BUN 38 (H) 10/12/2017 1126   CREATININE 3.67 (H) 04/20/2019 0428   CALCIUM 8.7 (L) 04/20/2019 0428   GFRNONAA 14 (L) 04/20/2019 0428   GFRAA 17 (L) 04/20/2019 0428   CBC    Component Value Date/Time   WBC 14.0 (H) 04/20/2019 0428   RBC 3.44 (L) 04/20/2019 0428   HGB 10.9 (L) 04/20/2019 0428   HGB 13.2 06/19/2017 0940   HCT  35.0 (L) 04/20/2019 0428   HCT 38.1 06/19/2017 0940   PLT 323 04/20/2019 0428   PLT 201 06/19/2017 0940   MCV 101.7 (H) 04/20/2019 0428   MCV 93 06/19/2017 0940   MCH 31.7 04/20/2019 0428   MCHC 31.1 04/20/2019 0428   RDW 14.2 04/20/2019 0428   RDW 14.1 06/19/2017 0940   LYMPHSABS 1.5 04/18/2019 1652   MONOABS 0.8 04/18/2019 1652   EOSABS 0.1 04/18/2019 1652   BASOSABS 0.0 04/18/2019 1652    Assessment/Plan: 1.  AKI/CKD stage V- in setting of decompensated CHF and diuresis.  His Cr is slightly improved from yesterday but still above his baseline.  He is without uremic symptoms and is planning to start PD training on 05/05/19.  He has an appointment with Dr. Hollie Salk for follow up on 04/24/19.  Hopefully we can avoid initiating dialysis while in the hospital. 1. Cr stable and without uremic symptoms 2. Acute diastolic CHF- improving with IV lasix 3. Acute hypoxic respiratory failure- thought to be due to pulmonary edema but pneumonia cannot be ruled out so on ceftriaxone per Cardiology 4. Bleeding from PD catheter exit site- will dose with DDAVP as he had this issue post-operatively. 5. CAD s/p CABG- stable 6. Hypokalemia- replete and follow. 7. Anemia of CKD- follow and hold ESA for now.  8. HTN- stable 9. DM per primary.  Donetta Potts, MD [employer] [phone]

## 2019-04-21 ENCOUNTER — Other Ambulatory Visit: Payer: Self-pay | Admitting: *Deleted

## 2019-04-21 DIAGNOSIS — I509 Heart failure, unspecified: Secondary | ICD-10-CM

## 2019-04-21 DIAGNOSIS — I350 Nonrheumatic aortic (valve) stenosis: Secondary | ICD-10-CM

## 2019-04-21 LAB — CBC
HCT: 28.3 % — ABNORMAL LOW (ref 39.0–52.0)
Hemoglobin: 9 g/dL — ABNORMAL LOW (ref 13.0–17.0)
MCH: 32.1 pg (ref 26.0–34.0)
MCHC: 31.8 g/dL (ref 30.0–36.0)
MCV: 101.1 fL — ABNORMAL HIGH (ref 80.0–100.0)
Platelets: 311 10*3/uL (ref 150–400)
RBC: 2.8 MIL/uL — ABNORMAL LOW (ref 4.22–5.81)
RDW: 14 % (ref 11.5–15.5)
WBC: 12 10*3/uL — ABNORMAL HIGH (ref 4.0–10.5)
nRBC: 0.3 % — ABNORMAL HIGH (ref 0.0–0.2)

## 2019-04-21 LAB — RENAL FUNCTION PANEL
Albumin: 2.3 g/dL — ABNORMAL LOW (ref 3.5–5.0)
Anion gap: 12 (ref 5–15)
BUN: 44 mg/dL — ABNORMAL HIGH (ref 8–23)
CO2: 30 mmol/L (ref 22–32)
Calcium: 8.6 mg/dL — ABNORMAL LOW (ref 8.9–10.3)
Chloride: 102 mmol/L (ref 98–111)
Creatinine, Ser: 3.55 mg/dL — ABNORMAL HIGH (ref 0.61–1.24)
GFR calc Af Amer: 17 mL/min — ABNORMAL LOW (ref >60)
GFR calc non Af Amer: 15 mL/min — ABNORMAL LOW (ref >60)
Glucose, Bld: 219 mg/dL — ABNORMAL HIGH (ref 70–99)
Phosphorus: 3.1 mg/dL (ref 2.5–4.6)
Potassium: 3.6 mmol/L (ref 3.5–5.1)
Sodium: 144 mmol/L (ref 135–145)

## 2019-04-21 LAB — GLUCOSE, CAPILLARY
Glucose-Capillary: 200 mg/dL — ABNORMAL HIGH (ref 70–99)
Glucose-Capillary: 287 mg/dL — ABNORMAL HIGH (ref 70–99)
Glucose-Capillary: 309 mg/dL — ABNORMAL HIGH (ref 70–99)
Glucose-Capillary: 190 mg/dL — ABNORMAL HIGH (ref 70–99)

## 2019-04-21 LAB — HEMOGLOBIN A1C
Hgb A1c MFr Bld: 7.3 % — ABNORMAL HIGH (ref 4.8–5.6)
Mean Plasma Glucose: 162.81 mg/dL

## 2019-04-21 MED ORDER — DARBEPOETIN ALFA 40 MCG/0.4ML IJ SOSY
40.0000 ug | PREFILLED_SYRINGE | Freq: Once | INTRAMUSCULAR | Status: AC
  Administered 2019-04-21: 40 ug via SUBCUTANEOUS
  Filled 2019-04-21: qty 0.4

## 2019-04-21 NOTE — Patient Outreach (Signed)
[facility]) Care Management  04/21/2019  [name] 1933-07-24 [id number]   RN Health Coach Discipline Closure. Patient has been admitted into hospital.  Plan: Discipline closure letter sent to PCP  [place] [facility] [phone]

## 2019-04-21 NOTE — Progress Notes (Signed)
Inpatient Diabetes Program Recommendations  AACE/ADA: New Consensus Statement on Inpatient Glycemic Control (2015)  Target Ranges:  Prepandial:   less than 140 mg/dL      Peak postprandial:   less than 180 mg/dL (1-2 hours)      Critically ill patients:  140 - 180 mg/dL   Lab Results  Component Value Date   GLUCAP 200 (H) 04/21/2019   HGBA1C 7.3 (H) 04/21/2019    Review of Glycemic Control Results for [name], [name] (MRN [id number]) as of 04/21/2019 11:21  Ref. Range 04/20/2019 06:18 04/20/2019 11:46 04/20/2019 15:58 04/20/2019 21:16 04/21/2019 05:58  Glucose-Capillary Latest Ref Range: 70 - 99 mg/dL 180 (H) 231 (H) 227 (H) 291 (H) 200 (H)     Diabetes history: DM2 Outpatient Diabetes medications: Amaryl 4 mg QD + Trajenta 5 mg QD Current orders for Inpatient glycemic control: Novolog 0-6 TID with measl + 0-5 QHS  Inpatient Diabetes Program Recommendations:     Noted that fasting CBG's 180/200  -Please consider Novolog 3 units TID with meals if eats at least 50% of meal  Thank you, Reche Dixon, RN, BSN Diabetes Coordinator Inpatient Diabetes Program [phone] (team pager from 8a-5p)

## 2019-04-21 NOTE — Plan of Care (Signed)
  Problem: Education: Goal: Knowledge of General Education information will improve Description: Including pain rating scale, medication(s)/side effects and non-pharmacologic comfort measures Outcome: Progressing   Problem: Health Behavior/Discharge Planning: Goal: Ability to manage health-related needs will improve Outcome: Progressing   Problem: Clinical Measurements: Goal: Ability to maintain clinical measurements within normal limits will improve Outcome: Progressing Goal: Will remain free from infection Outcome: Progressing Goal: Diagnostic test results will improve Outcome: Progressing Goal: Respiratory complications will improve Outcome: Progressing Goal: Cardiovascular complication will be avoided Outcome: Progressing   Problem: Activity: Goal: Risk for activity intolerance will decrease Outcome: Progressing   Problem: Elimination: Goal: Will not experience complications related to bowel motility Outcome: Progressing   Problem: Skin Integrity: Goal: Risk for impaired skin integrity will decrease Outcome: Progressing

## 2019-04-21 NOTE — Progress Notes (Signed)
Wife at bedside asking for update it was given. She is asking if patient's peritoneal HMD cath needs to be flushed she states was told by his other outside team it needs be flushed weekly reported would check with Dr.Foster. She called back reported would put orders in for weekly flush. No further changes noted.

## 2019-04-21 NOTE — Plan of Care (Signed)
  Problem: Nutrition: Goal: Adequate nutrition will be maintained Outcome: Completed/Met   Problem: Coping: Goal: Level of anxiety will decrease Outcome: Completed/Met   Problem: Elimination: Goal: Will not experience complications related to urinary retention Outcome: Completed/Met   Problem: Pain Managment: Goal: General experience of comfort will improve Outcome: Completed/Met   Problem: Safety: Goal: Ability to remain free from injury will improve Outcome: Completed/Met

## 2019-04-21 NOTE — Progress Notes (Addendum)
Progress Note  Patient Name: [name] Date of Encounter: 04/21/2019  Primary Cardiologist: Pixie Casino, MD   Subjective   Pt is feeling better. He is not very talkative but feels like his breathing is almost back to baseline. He slept well last night with no obvious ortohpnea (he is unsure). He is still using oxygen by [place], does not use at home. Still has 1-2+ pitting edema of lower legs.   Inpatient Medications    Scheduled Meds: . amLODipine  10 mg Oral QHS  . aspirin EC  81 mg Oral Daily  . atorvastatin  40 mg Oral QHS  . carvedilol  3.125 mg Oral BID WC  . folic acid  1 mg Oral Daily  . furosemide  80 mg Intravenous BID  . hydrALAZINE  50 mg Oral TID  . insulin aspart  0-5 Units Subcutaneous QHS  . insulin aspart  0-6 Units Subcutaneous TID WC  . mouth rinse  15 mL Mouth Rinse BID  . multivitamin with minerals  1 tablet Oral Daily   Continuous Infusions: . cefTRIAXone (ROCEPHIN)  IV 1 g (04/20/19 2236)   PRN Meds: acetaminophen, bisacodyl, docusate sodium, hydroxypropyl methylcellulose / hypromellose, nitroGLYCERIN, ondansetron (ZOFRAN) IV, traZODone   Vital Signs    Vitals:   04/20/19 2000 04/20/19 2035 04/21/19 0348 04/21/19 0416  BP:  (!) 177/69  137/63  Pulse:  80  70  Resp:  20  20  Temp:  98.5 F (36.9 C)  99.1 F (37.3 C)  TempSrc:  Oral  Oral  SpO2: 92% 96% 94% 95%  Weight:    79 kg  Height:        Intake/Output Summary (Last 24 hours) at 04/21/2019 0808 Last data filed at 04/21/2019 0400 Gross per 24 hour  Intake 800 ml  Output 1750 ml  Net -950 ml   Last 3 Weights 04/21/2019 04/20/2019 04/19/2019  Weight (lbs) 174 lb 1.6 oz 174 lb 9.6 oz 176 lb 8 oz  Weight (kg) 78.971 kg 79.198 kg 80.06 kg      Telemetry    Sinus rhythm in the 60s with PACs and rare PVCs- Personally Reviewed  ECG    No new tracings for review  Physical Exam   GEN: No acute distress.   Neck: No JVD Cardiac: RRR, no murmurs, rubs, or gallops.  Respiratory:  Clear to auscultation bilaterally. GI: Soft, nontender, non-distended  MS: 1-2+ pitting edema of bilateral lower legs Neuro:  Nonfocal  Psych: Normal affect   Labs    High Sensitivity Troponin:   Recent Labs  Lab 04/18/19 1652 04/18/19 1841  TROPONINIHS 335* 349*      Chemistry Recent Labs  Lab 04/19/19 1955 04/20/19 0428 04/21/19 0350  NA 144 146* 144  K 3.3* 3.4* 3.6  CL 98 102 102  CO2 34* 33* 30  GLUCOSE 231* 203* 219*  BUN 44* 43* 44*  CREATININE 3.65* 3.67* 3.55*  CALCIUM 8.9 8.7* 8.6*  ALBUMIN  --  2.6* 2.3*  GFRNONAA 14* 14* 15*  GFRAA 17* 17* 17*  ANIONGAP 12 11 12      Hematology Recent Labs  Lab 04/19/19 0332 04/20/19 0428 04/21/19 0350  WBC 13.4* 14.0* 12.0*  RBC 3.28* 3.44* 2.80*  HGB 10.5* 10.9* 9.0*  HCT 33.2* 35.0* 28.3*  MCV 101.2* 101.7* 101.1*  MCH 32.0 31.7 32.1  MCHC 31.6 31.1 31.8  RDW 14.1 14.2 14.0  PLT 295 323 311    BNP Recent Labs  Lab 04/18/19  1652  BNP 1,406.6*     DDimer No results for input(s): DDIMER in the last 168 hours.   Radiology    ECHOCARDIOGRAM COMPLETE  Result Date: 04/19/2019    ECHOCARDIOGRAM REPORT   Patient Name:   [name] Date of Exam: 04/19/2019 Medical Rec #:  [id number]       Height:       69.0 in Accession #:    [id number]      Weight:       176.5 lb Date of Birth:  1933-03-07        BSA:          1.959 m Patient Age:    43 years        BP:           142/70 mmHg Patient Gender: M               HR:           75 bpm. Exam Location:  Inpatient Procedure: 2D Echo Indications:    Acute Coronary Syndrome I24.9  History:        Patient has prior history of Echocardiogram examinations, most                 recent 02/28/2017. CHF, CAD, Prior CABG; Risk Factors:Diabetes,                 Hypertension and Dyslipidemia.  Sonographer:    Mikki Santee RDCS (AE) Referring Phys: [place]  1. Since last echo, estimated RV pressure has increased. Aortic stenosis is unchanged.  2. Left  ventricular ejection fraction, by estimation, is 60 to 65%. The left ventricle has normal function. The left ventricle has no regional wall motion abnormalities. There is mild left ventricular hypertrophy. Left ventricular diastolic parameters are consistent with Grade II diastolic dysfunction (pseudonormalization). Elevated left atrial pressure.  3. Right ventricular systolic function is normal. The right ventricular size is mildly enlarged. There is severely elevated pulmonary artery systolic pressure. The estimated right ventricular systolic pressure is 63.3 mmHg.  4. Left atrial size was mild to moderately dilated.  5. Right atrial size was moderately dilated.  6. The mitral valve is normal in structure and function. Mild mitral valve regurgitation. No evidence of mitral stenosis.  7. Tricuspid valve regurgitation is moderate to severe.  8. The aortic valve is tricuspid. Aortic valve regurgitation is not visualized. Mild to moderate aortic valve stenosis. Aortic valve area, by VTI measures 1.18 cm. Aortic valve mean gradient measures 17.0 mmHg. Aortic valve Vmax measures 2.84 m/s.  9. The inferior vena cava is dilated in size with >50% respiratory variability, suggesting right atrial pressure of 8 mmHg. FINDINGS  Left Ventricle: Left ventricular ejection fraction, by estimation, is 60 to 65%. The left ventricle has normal function. The left ventricle has no regional wall motion abnormalities. The left ventricular internal cavity size was normal in size. There is  mild left ventricular hypertrophy. Left ventricular diastolic parameters are consistent with Grade II diastolic dysfunction (pseudonormalization). Elevated left atrial pressure. Right Ventricle: The right ventricular size is mildly enlarged. No increase in right ventricular wall thickness. Right ventricular systolic function is normal. There is severely elevated pulmonary artery systolic pressure. The tricuspid regurgitant velocity is 4.18 m/s, and  with an assumed right atrial pressure of 8 mmHg, the estimated right ventricular systolic pressure is 35.4 mmHg. Left Atrium: Left atrial size was mild to moderately dilated. Right Atrium: Right atrial size was moderately  dilated. Pericardium: There is no evidence of pericardial effusion. Mitral Valve: The mitral valve is normal in structure and function. Normal mobility of the mitral valve leaflets. Mild mitral valve regurgitation. No evidence of mitral valve stenosis. Tricuspid Valve: The tricuspid valve is normal in structure. Tricuspid valve regurgitation is moderate to severe. No evidence of tricuspid stenosis. Aortic Valve: The aortic valve is tricuspid. . There is moderate thickening and moderate calcification of the aortic valve. Aortic valve regurgitation is not visualized. Mild to moderate aortic stenosis is present. There is moderate thickening of the aortic valve. There is moderate calcification of the aortic valve. Aortic valve mean gradient measures 17.0 mmHg. Aortic valve peak gradient measures 32.3 mmHg. Aortic valve area, by VTI measures 1.18 cm. Pulmonic Valve: The pulmonic valve was normal in structure. Pulmonic valve regurgitation is not visualized. No evidence of pulmonic stenosis. Aorta: The aortic root is normal in size and structure. Venous: The inferior vena cava is dilated in size with greater than 50% respiratory variability, suggesting right atrial pressure of 8 mmHg. IAS/Shunts: No atrial level shunt detected by color flow Doppler. Additional Comments: Since last echo, estimated RV pressure has increased. Aortic stenosis is unchanged.  LEFT VENTRICLE PLAX 2D LVIDd:         4.80 cm  Diastology LVIDs:         3.50 cm  LV e' lateral:   6.09 cm/s LV PW:         1.20 cm  LV E/e' lateral: 24.5 LV IVS:        1.20 cm  LV e' medial:    4.46 cm/s LVOT diam:     2.10 cm  LV E/e' medial:  33.4 LV SV:         80.36 ml LV SV Index:   41.02 LVOT Area:     3.46 cm  RIGHT VENTRICLE RV S prime:      8.38 cm/s TAPSE (M-mode): 2.1 cm LEFT ATRIUM             Index       RIGHT ATRIUM           Index LA diam:        4.60 cm 2.35 cm/m  RA Area:     18.20 cm LA Vol (A2C):   82.1 ml 41.91 ml/m RA Volume:   49.20 ml  25.11 ml/m LA Vol (A4C):   65.0 ml 33.18 ml/m LA Biplane Vol: 79.1 ml 40.38 ml/m  AORTIC VALVE AV Area (Vmax):    1.43 cm AV Area (Vmean):   1.30 cm AV Area (VTI):     1.18 cm AV Vmax:           284.00 cm/s AV Vmean:          200.000 cm/s AV VTI:            0.680 m AV Peak Grad:      32.3 mmHg AV Mean Grad:      17.0 mmHg LVOT Vmax:         117.00 cm/s LVOT Vmean:        75.200 cm/s LVOT VTI:          0.232 m LVOT/AV VTI ratio: 0.34  AORTA Ao Root diam: 3.20 cm MITRAL VALVE                TRICUSPID VALVE MV Area (PHT): 3.27 cm     TR Peak grad:   69.9 mmHg MV Decel Time: 232  msec     TR Vmax:        418.00 cm/s MR Peak grad: 126.3 mmHg MR Mean grad: 84.0 mmHg     SHUNTS MR Vmax:      562.00 cm/s   Systemic VTI:  0.23 m MR Vmean:     439.0 cm/s    Systemic Diam: 2.10 cm MV E velocity: 149.00 cm/s MV A velocity: 141.00 cm/s MV E/A ratio:  1.06 Candee Furbish MD Electronically signed by Candee Furbish MD Signature Date/Time: 04/19/2019/4:15:51 PM    Final     Cardiac Studies   Echocardiogram 04/19/2019  1. Since last echo, estimated RV pressure has increased. Aortic stenosis  is unchanged.  2. Left ventricular ejection fraction, by estimation, is 60 to 65%. The  left ventricle has normal function. The left ventricle has no regional  wall motion abnormalities. There is mild left ventricular hypertrophy.  Left ventricular diastolic parameters  are consistent with Grade II diastolic dysfunction (pseudonormalization).  Elevated left atrial pressure.  3. Right ventricular systolic function is normal. The right ventricular  size is mildly enlarged. There is severely elevated pulmonary artery  systolic pressure. The estimated right ventricular systolic pressure is  63.0 mmHg.  4. Left atrial  size was mild to moderately dilated.  5. Right atrial size was moderately dilated.  6. The mitral valve is normal in structure and function. Mild mitral  valve regurgitation. No evidence of mitral stenosis.  7. Tricuspid valve regurgitation is moderate to severe.  8. The aortic valve is tricuspid. Aortic valve regurgitation is not  visualized. Mild to moderate aortic valve stenosis. Aortic valve area, by  VTI measures 1.18 cm. Aortic valve mean gradient measures 17.0 mmHg.  Aortic valve Vmax measures 2.84 m/s.  9. The inferior vena cava is dilated in size with >50% respiratory  variability, suggesting right atrial pressure of 8 mmHg.    Right/left heart catheterization 05/2017:  Prox RCA to Mid RCA lesion is 30% stenosed.  Mid RCA lesion is 40% stenosed.  Dist RCA lesion is 30% stenosed.  Ost LAD to Prox LAD lesion is 100% stenosed.  LIMA graft was visualized by angiography and is normal in caliber.  Ost Cx to Prox Cx lesion is 90% stenosed.  Hemodynamic findings consistent with mild pulmonary hypertension.  LV end diastolic pressure is normal.  1. Severe double vessel CAD s/p 3V CABG with 3/3 patent bypass grafts  2. The LAD has 100% proximal occlusion. The entire LAD fills from the patent LIMA graft. The Diagonal branch fills from the vein graft 3. The Circumflex has 90% proximal stenosis. The vein graft fills the large obtuse marginal graft.  4. The RCA is a large dominant vessel with mild diffuse calcific plaque in the proximal, mid and distal vessel but no obstructive lesions.  5. The LIMA to the LAD is patent 6 The sequential saphenous vein graft to the Diagonal and OM is patent with mild filling defects seen in the proximal, mid and distal segments of the body of the graft. These lesions do not appear to be flow limiting.  7. Mild aortic stenosis. Peak to peak gradient 4 mmHg. The valve was easily crossed.  8. Right and left heart filling pressures outline above.     Recommendations: Continue medical management of CAD. Continue aggressive blood pressure control and diuresis for diastolic CHF. Will hydrate for 6 hours post cath and plan discharge home later today.  Patient Profile     86 y.o. male with chronic  kidney disease stage IV, type 2 diabetes, prostate cancer, hypertension, coronary disease status post CABG 1987 and chronic diastolic heart failure admitted with shortness of breath, mildly elevated troponin 335.  Covid negative.  Assessment & Plan    Acute on chronic diastolic heart failure -Likely pulmonary edema on chest x-ray, however pneumonia cannot be excluded or a combination of heart failure and pneumonia.  The patient did have a mild fever and WBCs up to 14.0.  He is being treated with ceftriaxone.  In the last 24 hours temp max was 99.1.  -Echo done on 2/20 showed normal LV systolic function with EF 60-65%, no R WMA, mild LVH, grade 2 diastolic dysfunction.  There was increased estimated RV pressure. -Patient is being diuresed with Lasix 80 mg IV twice daily with good urine output.  1.75 L yesterday.  The patient is net -1.8 L since admission. -Appears to be improving symptomatically. Breathing better but still on oxygen. Try to wean down and follow oxygen level with increased activity. Still has LE edema.  -Appreciate input by nephrology on diuretics.  CAD -S/p CABG 1987 right/left heart cath 05/2017 showed 3/3 patent bypass grafts. -Patient continues on aspirin and statin. -No chest discomfort.  Hypertension -Blood pressure was elevated last night at 177/69.  Improved this morning to 137/63. -Patient has a history of orthostatic complaints with favoring permissive hypertension to minimize symptoms. -Continues on amlodipine, carvedilol, hydralazine and Lasix.  AKI/CKD stage V -Followed by nephrology. -Plans for initiation of peritoneal dialysis, PD training to start 05/05/2019. -Serum creatinine 4.36 > 3.55 -Potassium was initially  low at 2.6.  Improved, 3.6 today.  Hyperlipidemia, goal LDL <70 -On atorvastatin 40 mg -LDL is 56, at goal.  Continue current therapy.  Diabetes type 2 -A1c 7.3 -On sliding scale insulin while hospitalized  Anemia of CKD -Hemoglobin has drifted down from 11.4 to 9.0. -Followed by nephrology with plan for EPA.   Aortic stenosis -Noted to be mild on right/left heart cath in 05/2017.  Echo done on 2/20 showed no change in aortic stenosis, mild-mod.   For questions or updates, please contact [facility] Please consult [email or web address] for contact info under        Signed, Daune Perch, NP  04/21/2019, 8:08 AM

## 2019-04-21 NOTE — Progress Notes (Signed)
Nephrology Progress Note:  Patient ID: [name], male   DOB: Jul 01, 1933, 86 y.o.   MRN: [id number] S:  Had 1.8 liters UOP over 2/21. Feels ok .  On 4 liters oxygen and not on any at home.  States no history of cancer and agrees to start ESA. Discussed risks/benefits, and indications.  Review of systems:  Shortness of breath better Good urine output  PD catheter stopped bleeding  No n/v    O:BP 137/63 (BP Location: Right Arm)   Pulse 70   Temp 99.1 F (37.3 C) (Oral)   Resp 20   Ht 5\' 9"  (1.753 m)   Wt 79 kg   SpO2 95%   BMI 25.71 kg/m   Intake/Output Summary (Last 24 hours) at 04/21/2019 0819 Last data filed at 04/21/2019 0400 Gross per 24 hour  Intake 800 ml  Output 1750 ml  Net -950 ml   Intake/Output: I/O last 3 completed shifts: In: 1280 [P.O.:1180; IV Piggyback:100] Out: 5456 [Urine:3550]  Intake/Output this shift:  No intake/output data recorded. Weight change: -0.227 kg   Physical Exam:  Gen: adult male in bed in NAD CVS: S1S2 no rub Resp: cta on 4 liters oxygen; unlabored at rest Abd: soft, NT/ND; PD catheter exit site without bleeding Ext: 1-2+ edema Neuro - alert and oriented x 3; follows commands and provides hx Psych normal mood and affect  Recent Labs  Lab 04/18/19 1652 04/19/19 0332 04/19/19 1955 04/20/19 0428 04/21/19 0350  NA 142 147* 144 146* 144  K 2.6* 2.9* 3.3* 3.4* 3.6  CL 89* 98 98 102 102  CO2 37* 34* 34* 33* 30  GLUCOSE 309* 227* 231* 203* 219*  BUN 53* 48* 44* 43* 44*  CREATININE 4.36* 4.01* 3.65* 3.67* 3.55*  ALBUMIN  --   --   --  2.6* 2.3*  CALCIUM 8.7* 8.3* 8.9 8.7* 8.6*  PHOS  --   --   --  2.9 3.1   Liver Function Tests: Recent Labs  Lab 04/20/19 0428 04/21/19 0350  ALBUMIN 2.6* 2.3*   CBC: Recent Labs  Lab 04/18/19 1652 04/18/19 1652 04/19/19 0332 04/20/19 0428 04/21/19 0350  WBC 12.4*   < > 13.4* 14.0* 12.0*  NEUTROABS 9.9*  --   --   --   --   HGB 11.4*   < > 10.5* 10.9* 9.0*  HCT 36.7*   < > 33.2*  35.0* 28.3*  MCV 102.5*  --  101.2* 101.7* 101.1*  PLT 336   < > 295 323 311   < > = values in this interval not displayed.   Cardiac Enzymes: No results for input(s): CKTOTAL, CKMB, CKMBINDEX, TROPONINI in the last 168 hours. CBG: Recent Labs  Lab 04/20/19 0618 04/20/19 1146 04/20/19 1558 04/20/19 2116 04/21/19 0558  GLUCAP 180* 231* 227* 291* 200*    Studies/Results: ECHOCARDIOGRAM COMPLETE  Result Date: 04/19/2019    ECHOCARDIOGRAM REPORT   Patient Name:   [name] Date of Exam: 04/19/2019 Medical Rec #:  [id number]       Height:       69.0 in Accession #:    [id number]      Weight:       176.5 lb Date of Birth:  1933/08/14        BSA:          1.959 m Patient Age:    77 years        BP:  142/70 mmHg Patient Gender: M               HR:           75 bpm. Exam Location:  Inpatient Procedure: 2D Echo Indications:    Acute Coronary Syndrome I24.9  History:        Patient has prior history of Echocardiogram examinations, most                 recent 02/28/2017. CHF, CAD, Prior CABG; Risk Factors:Diabetes,                 Hypertension and Dyslipidemia.  Sonographer:    Mikki Santee RDCS (AE) Referring Phys: [place]  1. Since last echo, estimated RV pressure has increased. Aortic stenosis is unchanged.  2. Left ventricular ejection fraction, by estimation, is 60 to 65%. The left ventricle has normal function. The left ventricle has no regional wall motion abnormalities. There is mild left ventricular hypertrophy. Left ventricular diastolic parameters are consistent with Grade II diastolic dysfunction (pseudonormalization). Elevated left atrial pressure.  3. Right ventricular systolic function is normal. The right ventricular size is mildly enlarged. There is severely elevated pulmonary artery systolic pressure. The estimated right ventricular systolic pressure is 94.7 mmHg.  4. Left atrial size was mild to moderately dilated.  5. Right atrial size was moderately  dilated.  6. The mitral valve is normal in structure and function. Mild mitral valve regurgitation. No evidence of mitral stenosis.  7. Tricuspid valve regurgitation is moderate to severe.  8. The aortic valve is tricuspid. Aortic valve regurgitation is not visualized. Mild to moderate aortic valve stenosis. Aortic valve area, by VTI measures 1.18 cm. Aortic valve mean gradient measures 17.0 mmHg. Aortic valve Vmax measures 2.84 m/s.  9. The inferior vena cava is dilated in size with >50% respiratory variability, suggesting right atrial pressure of 8 mmHg. FINDINGS  Left Ventricle: Left ventricular ejection fraction, by estimation, is 60 to 65%. The left ventricle has normal function. The left ventricle has no regional wall motion abnormalities. The left ventricular internal cavity size was normal in size. There is  mild left ventricular hypertrophy. Left ventricular diastolic parameters are consistent with Grade II diastolic dysfunction (pseudonormalization). Elevated left atrial pressure. Right Ventricle: The right ventricular size is mildly enlarged. No increase in right ventricular wall thickness. Right ventricular systolic function is normal. There is severely elevated pulmonary artery systolic pressure. The tricuspid regurgitant velocity is 4.18 m/s, and with an assumed right atrial pressure of 8 mmHg, the estimated right ventricular systolic pressure is 09.6 mmHg. Left Atrium: Left atrial size was mild to moderately dilated. Right Atrium: Right atrial size was moderately dilated. Pericardium: There is no evidence of pericardial effusion. Mitral Valve: The mitral valve is normal in structure and function. Normal mobility of the mitral valve leaflets. Mild mitral valve regurgitation. No evidence of mitral valve stenosis. Tricuspid Valve: The tricuspid valve is normal in structure. Tricuspid valve regurgitation is moderate to severe. No evidence of tricuspid stenosis. Aortic Valve: The aortic valve is  tricuspid. . There is moderate thickening and moderate calcification of the aortic valve. Aortic valve regurgitation is not visualized. Mild to moderate aortic stenosis is present. There is moderate thickening of the aortic valve. There is moderate calcification of the aortic valve. Aortic valve mean gradient measures 17.0 mmHg. Aortic valve peak gradient measures 32.3 mmHg. Aortic valve area, by VTI measures 1.18 cm. Pulmonic Valve: The pulmonic valve was normal in structure. Pulmonic  valve regurgitation is not visualized. No evidence of pulmonic stenosis. Aorta: The aortic root is normal in size and structure. Venous: The inferior vena cava is dilated in size with greater than 50% respiratory variability, suggesting right atrial pressure of 8 mmHg. IAS/Shunts: No atrial level shunt detected by color flow Doppler. Additional Comments: Since last echo, estimated RV pressure has increased. Aortic stenosis is unchanged.  LEFT VENTRICLE PLAX 2D LVIDd:         4.80 cm  Diastology LVIDs:         3.50 cm  LV e' lateral:   6.09 cm/s LV PW:         1.20 cm  LV E/e' lateral: 24.5 LV IVS:        1.20 cm  LV e' medial:    4.46 cm/s LVOT diam:     2.10 cm  LV E/e' medial:  33.4 LV SV:         80.36 ml LV SV Index:   41.02 LVOT Area:     3.46 cm  RIGHT VENTRICLE RV S prime:     8.38 cm/s TAPSE (M-mode): 2.1 cm LEFT ATRIUM             Index       RIGHT ATRIUM           Index LA diam:        4.60 cm 2.35 cm/m  RA Area:     18.20 cm LA Vol (A2C):   82.1 ml 41.91 ml/m RA Volume:   49.20 ml  25.11 ml/m LA Vol (A4C):   65.0 ml 33.18 ml/m LA Biplane Vol: 79.1 ml 40.38 ml/m  AORTIC VALVE AV Area (Vmax):    1.43 cm AV Area (Vmean):   1.30 cm AV Area (VTI):     1.18 cm AV Vmax:           284.00 cm/s AV Vmean:          200.000 cm/s AV VTI:            0.680 m AV Peak Grad:      32.3 mmHg AV Mean Grad:      17.0 mmHg LVOT Vmax:         117.00 cm/s LVOT Vmean:        75.200 cm/s LVOT VTI:          0.232 m LVOT/AV VTI ratio: 0.34   AORTA Ao Root diam: 3.20 cm MITRAL VALVE                TRICUSPID VALVE MV Area (PHT): 3.27 cm     TR Peak grad:   69.9 mmHg MV Decel Time: 232 msec     TR Vmax:        418.00 cm/s MR Peak grad: 126.3 mmHg MR Mean grad: 84.0 mmHg     SHUNTS MR Vmax:      562.00 cm/s   Systemic VTI:  0.23 m MR Vmean:     439.0 cm/s    Systemic Diam: 2.10 cm MV E velocity: 149.00 cm/s MV A velocity: 141.00 cm/s MV E/A ratio:  1.06 Candee Furbish MD Electronically signed by Candee Furbish MD Signature Date/Time: 04/19/2019/4:15:51 PM    Final    . amLODipine  10 mg Oral QHS  . aspirin EC  81 mg Oral Daily  . atorvastatin  40 mg Oral QHS  . carvedilol  3.125 mg Oral BID WC  . folic acid  1 mg Oral  Daily  . furosemide  80 mg Intravenous BID  . hydrALAZINE  50 mg Oral TID  . insulin aspart  0-5 Units Subcutaneous QHS  . insulin aspart  0-6 Units Subcutaneous TID WC  . mouth rinse  15 mL Mouth Rinse BID  . multivitamin with minerals  1 tablet Oral Daily    BMET    Component Value Date/Time   NA 144 04/21/2019 0350   NA 143 10/12/2017 1126   K 3.6 04/21/2019 0350   CL 102 04/21/2019 0350   CO2 30 04/21/2019 0350   GLUCOSE 219 (H) 04/21/2019 0350   BUN 44 (H) 04/21/2019 0350   BUN 38 (H) 10/12/2017 1126   CREATININE 3.55 (H) 04/21/2019 0350   CALCIUM 8.6 (L) 04/21/2019 0350   GFRNONAA 15 (L) 04/21/2019 0350   GFRAA 17 (L) 04/21/2019 0350   CBC    Component Value Date/Time   WBC 12.0 (H) 04/21/2019 0350   RBC 2.80 (L) 04/21/2019 0350   HGB 9.0 (L) 04/21/2019 0350   HGB 13.2 06/19/2017 0940   HCT 28.3 (L) 04/21/2019 0350   HCT 38.1 06/19/2017 0940   PLT 311 04/21/2019 0350   PLT 201 06/19/2017 0940   MCV 101.1 (H) 04/21/2019 0350   MCV 93 06/19/2017 0940   MCH 32.1 04/21/2019 0350   MCHC 31.8 04/21/2019 0350   RDW 14.0 04/21/2019 0350   RDW 14.1 06/19/2017 0940   LYMPHSABS 1.5 04/18/2019 1652   MONOABS 0.8 04/18/2019 1652   EOSABS 0.1 04/18/2019 1652   BASOSABS 0.0 04/18/2019 1652     Assessment/Plan: 1.  AKI/CKD stage V- in setting of decompensated CHF and diuresis.  His Cr is slightly improved from yesterday but still above his baseline.  He is without uremic symptoms and is planning to start PD training on 05/05/19.  He has an appointment with Dr. Hollie Salk for follow up on 04/24/19.  Hopefully we can avoid initiating dialysis while in the hospital. 1. Cr stable and without uremic symptoms  2. Acute diastolic CHF- improving with IV lasix   3. Acute hypoxic respiratory failure- thought to be due to pulmonary edema.  On lasix. On abx per primary 4. Bleeding from PD catheter exit site- dosed with DDAVP as he had this issue post-operatively. 5. CAD s/p CABG- stable 6. Hypokalemia- improved/stable  7. Anemia of CKD- will initiate ESA - pt agrees. 8. HTN- stable 9. DM per primary.  dispo - still on iv lasix and 4 liters o2. Would remain inpatient  Claudia Desanctis, MD 04/21/2019 8:35 AM

## 2019-04-22 ENCOUNTER — Encounter (HOSPITAL_COMMUNITY): Payer: Self-pay | Admitting: Cardiology

## 2019-04-22 ENCOUNTER — Other Ambulatory Visit: Payer: Self-pay | Admitting: Internal Medicine

## 2019-04-22 DIAGNOSIS — I5021 Acute systolic (congestive) heart failure: Secondary | ICD-10-CM

## 2019-04-22 DIAGNOSIS — E119 Type 2 diabetes mellitus without complications: Secondary | ICD-10-CM

## 2019-04-22 DIAGNOSIS — N179 Acute kidney failure, unspecified: Secondary | ICD-10-CM

## 2019-04-22 DIAGNOSIS — N185 Chronic kidney disease, stage 5: Secondary | ICD-10-CM

## 2019-04-22 LAB — CBC
HCT: 30.1 % — ABNORMAL LOW (ref 39.0–52.0)
Hemoglobin: 9.5 g/dL — ABNORMAL LOW (ref 13.0–17.0)
MCH: 32 pg (ref 26.0–34.0)
MCHC: 31.6 g/dL (ref 30.0–36.0)
MCV: 101.3 fL — ABNORMAL HIGH (ref 80.0–100.0)
Platelets: 329 10*3/uL (ref 150–400)
RBC: 2.97 MIL/uL — ABNORMAL LOW (ref 4.22–5.81)
RDW: 14 % (ref 11.5–15.5)
WBC: 11 10*3/uL — ABNORMAL HIGH (ref 4.0–10.5)
nRBC: 0 % (ref 0.0–0.2)

## 2019-04-22 LAB — GLUCOSE, CAPILLARY
Glucose-Capillary: 221 mg/dL — ABNORMAL HIGH (ref 70–99)
Glucose-Capillary: 192 mg/dL — ABNORMAL HIGH (ref 70–99)
Glucose-Capillary: 183 mg/dL — ABNORMAL HIGH (ref 70–99)
Glucose-Capillary: 158 mg/dL — ABNORMAL HIGH (ref 70–99)

## 2019-04-22 LAB — RENAL FUNCTION PANEL
Albumin: 2.3 g/dL — ABNORMAL LOW (ref 3.5–5.0)
Anion gap: 12 (ref 5–15)
BUN: 45 mg/dL — ABNORMAL HIGH (ref 8–23)
CO2: 30 mmol/L (ref 22–32)
Calcium: 8.9 mg/dL (ref 8.9–10.3)
Chloride: 102 mmol/L (ref 98–111)
Creatinine, Ser: 3.38 mg/dL — ABNORMAL HIGH (ref 0.61–1.24)
GFR calc Af Amer: 18 mL/min — ABNORMAL LOW (ref >60)
GFR calc non Af Amer: 16 mL/min — ABNORMAL LOW (ref >60)
Glucose, Bld: 239 mg/dL — ABNORMAL HIGH (ref 70–99)
Phosphorus: 3.5 mg/dL (ref 2.5–4.6)
Potassium: 3.7 mmol/L (ref 3.5–5.1)
Sodium: 144 mmol/L (ref 135–145)

## 2019-04-22 MED ORDER — METOLAZONE 5 MG PO TABS
5.0000 mg | ORAL_TABLET | Freq: Once | ORAL | Status: AC
  Administered 2019-04-22: 5 mg via ORAL
  Filled 2019-04-22: qty 1

## 2019-04-22 MED ORDER — INSULIN DETEMIR 100 UNIT/ML ~~LOC~~ SOLN
8.0000 [IU] | Freq: Two times a day (BID) | SUBCUTANEOUS | Status: DC
  Administered 2019-04-22 – 2019-04-25 (×7): 8 [IU] via SUBCUTANEOUS
  Filled 2019-04-22 (×8): qty 0.08

## 2019-04-22 MED ORDER — INSULIN ASPART 100 UNIT/ML ~~LOC~~ SOLN
3.0000 [IU] | Freq: Three times a day (TID) | SUBCUTANEOUS | Status: DC
  Administered 2019-04-22 – 2019-04-25 (×8): 3 [IU] via SUBCUTANEOUS

## 2019-04-22 NOTE — Progress Notes (Signed)
Nephrology Progress Note:  Patient ID: [name], male   DOB: 1933-08-07, 86 y.o.   MRN: [id number]   S:  Had 2 liters UOP over 2/22. Feels ok .  On 2 liters oxygen and not on any at home.  Spoke with wife at bedside.  They had cancelled his appt with Dr. Hollie Salk originally set for this week due to his hospitalization.    Review of systems:   Shortness of breath better Good urine output  No n/v    O:BP 139/71 (BP Location: Left Arm)   Pulse (!) 52   Temp 98.2 F (36.8 C) (Oral)   Resp 18   Ht 5\' 9"  (1.753 m)   Wt 79.1 kg   SpO2 100%   BMI 25.75 kg/m   Intake/Output Summary (Last 24 hours) at 04/22/2019 1343 Last data filed at 04/22/2019 0900 Gross per 24 hour  Intake 480 ml  Output 2000 ml  Net -1520 ml   Intake/Output: I/O last 3 completed shifts: In: 900 [P.O.:800; IV Piggyback:100] Out: 2725 [Urine:2725]  Intake/Output this shift:  Total I/O In: 240 [P.O.:240] Out: -  Weight change: 0.136 kg   Physical Exam:   Gen: adult male in bed in NAD CVS: S1S2 no rub Resp: cta on 2 liters oxygen; unlabored at rest Abd: soft, NT/ND; PD catheter insertion site without bleeding. Ext: 2+ edema Neuro - alert and oriented x 3; follows commands and provides hx Psych normal mood and affect  Recent Labs  Lab 04/18/19 1652 04/19/19 0332 04/19/19 1955 04/20/19 0428 04/21/19 0350 04/22/19 0542  NA 142 147* 144 146* 144 144  K 2.6* 2.9* 3.3* 3.4* 3.6 3.7  CL 89* 98 98 102 102 102  CO2 37* 34* 34* 33* 30 30  GLUCOSE 309* 227* 231* 203* 219* 239*  BUN 53* 48* 44* 43* 44* 45*  CREATININE 4.36* 4.01* 3.65* 3.67* 3.55* 3.38*  ALBUMIN  --   --   --  2.6* 2.3* 2.3*  CALCIUM 8.7* 8.3* 8.9 8.7* 8.6* 8.9  PHOS  --   --   --  2.9 3.1 3.5   Liver Function Tests: Recent Labs  Lab 04/20/19 0428 04/21/19 0350 04/22/19 0542  ALBUMIN 2.6* 2.3* 2.3*   CBC: Recent Labs  Lab 04/18/19 1652 04/18/19 1652 04/19/19 0332 04/19/19 0332 04/20/19 0428 04/21/19 0350 04/22/19 0542   WBC 12.4*   < > 13.4*   < > 14.0* 12.0* 11.0*  NEUTROABS 9.9*  --   --   --   --   --   --   HGB 11.4*   < > 10.5*   < > 10.9* 9.0* 9.5*  HCT 36.7*   < > 33.2*   < > 35.0* 28.3* 30.1*  MCV 102.5*  --  101.2*  --  101.7* 101.1* 101.3*  PLT 336   < > 295   < > 323 311 329   < > = values in this interval not displayed.   Cardiac Enzymes: No results for input(s): CKTOTAL, CKMB, CKMBINDEX, TROPONINI in the last 168 hours. CBG: Recent Labs  Lab 04/21/19 1132 04/21/19 1607 04/21/19 2157 04/22/19 0619 04/22/19 1112  GLUCAP 287* 309* 190* 221* 192*    Studies/Results: No results found. Marland Kitchen amLODipine  10 mg Oral QHS  . aspirin EC  81 mg Oral Daily  . atorvastatin  40 mg Oral QHS  . carvedilol  3.125 mg Oral BID WC  . folic acid  1 mg Oral Daily  . furosemide  80 mg Intravenous BID  . hydrALAZINE  50 mg Oral TID  . insulin aspart  0-5 Units Subcutaneous QHS  . insulin aspart  0-6 Units Subcutaneous TID WC  . insulin aspart  3 Units Subcutaneous TID WC  . insulin detemir  8 Units Subcutaneous BID  . mouth rinse  15 mL Mouth Rinse BID  . multivitamin with minerals  1 tablet Oral Daily    BMET    Component Value Date/Time   NA 144 04/22/2019 0542   NA 143 10/12/2017 1126   K 3.7 04/22/2019 0542   CL 102 04/22/2019 0542   CO2 30 04/22/2019 0542   GLUCOSE 239 (H) 04/22/2019 0542   BUN 45 (H) 04/22/2019 0542   BUN 38 (H) 10/12/2017 1126   CREATININE 3.38 (H) 04/22/2019 0542   CALCIUM 8.9 04/22/2019 0542   GFRNONAA 16 (L) 04/22/2019 0542   GFRAA 18 (L) 04/22/2019 0542   CBC    Component Value Date/Time   WBC 11.0 (H) 04/22/2019 0542   RBC 2.97 (L) 04/22/2019 0542   HGB 9.5 (L) 04/22/2019 0542   HGB 13.2 06/19/2017 0940   HCT 30.1 (L) 04/22/2019 0542   HCT 38.1 06/19/2017 0940   PLT 329 04/22/2019 0542   PLT 201 06/19/2017 0940   MCV 101.3 (H) 04/22/2019 0542   MCV 93 06/19/2017 0940   MCH 32.0 04/22/2019 0542   MCHC 31.6 04/22/2019 0542   RDW 14.0 04/22/2019 0542    RDW 14.1 06/19/2017 0940   LYMPHSABS 1.5 04/18/2019 1652   MONOABS 0.8 04/18/2019 1652   EOSABS 0.1 04/18/2019 1652   BASOSABS 0.0 04/18/2019 1652    Assessment/Plan: 1.  AKI/CKD stage V- in setting of decompensated CHF and diuresis.  His Cr is slightly improved from yesterday but still above his baseline.  He is without uremic symptoms and is planning to start PD training on 05/05/19.  Hopefully we can avoid initiating dialysis while in the hospital. 1. Cr stable/improving and without uremic symptoms  2. Acute diastolic CHF- improving with IV lasix.  Metolazone once today before next lasix dose  3. Acute hypoxic respiratory failure- thought to be due to pulmonary edema.  On lasix. On abx per primary 4. Bleeding from PD catheter exit site- resolved; he is s/p DDAVP  5. CAD s/p CABG- stable 6. Hypokalemia- improved/stable  7. Anemia of CKD- initiated ESA - pt agrees. (received 40 mcg on 2/22) 8. HTN- acceptable control  9. DM per primary.  dispo - cardiology planning for discharge possibly 2/24.  We will set up follow-up with [facility] for one week post-discharge.   Claudia Desanctis, MD 04/22/2019 2:08 PM

## 2019-04-22 NOTE — Care Management Important Message (Signed)
Important Message  Patient Details  Name: [name] MRN: [id number] Date of Birth: [birth year]   Medicare Important Message Given:  Yes     Shelda Altes 04/22/2019, 9:25 AM

## 2019-04-22 NOTE — Progress Notes (Signed)
Progress Note  Patient Name: [name] Date of Encounter: 04/22/2019  Primary Cardiologist: Pixie Casino, MD   Subjective   Net negative another 1.3L overnight - more alert today. Does not remember seeing me yesterday but recognized me today. Weaned down to 2L [place].  Inpatient Medications    Scheduled Meds: . amLODipine  10 mg Oral QHS  . aspirin EC  81 mg Oral Daily  . atorvastatin  40 mg Oral QHS  . carvedilol  3.125 mg Oral BID WC  . folic acid  1 mg Oral Daily  . furosemide  80 mg Intravenous BID  . hydrALAZINE  50 mg Oral TID  . insulin aspart  0-5 Units Subcutaneous QHS  . insulin aspart  0-6 Units Subcutaneous TID WC  . mouth rinse  15 mL Mouth Rinse BID  . multivitamin with minerals  1 tablet Oral Daily   Continuous Infusions: . cefTRIAXone (ROCEPHIN)  IV 1 g (04/21/19 2302)   PRN Meds: acetaminophen, bisacodyl, docusate sodium, hydroxypropyl methylcellulose / hypromellose, nitroGLYCERIN, ondansetron (ZOFRAN) IV, traZODone   Vital Signs    Vitals:   04/21/19 2115 04/22/19 0525 04/22/19 0841 04/22/19 0843  BP: (!) 148/65 (!) 128/54 (!) 145/64 (!) 145/64  Pulse: (!) 58 65 60 60  Resp: 20 20    Temp: 98.5 F (36.9 C) 98.8 F (37.1 C)    TempSrc: Oral Oral    SpO2: 98% 100%    Weight:  79.1 kg    Height:        Intake/Output Summary (Last 24 hours) at 04/22/2019 0916 Last data filed at 04/22/2019 0525 Gross per 24 hour  Intake 480 ml  Output 2000 ml  Net -1520 ml   Last 3 Weights 04/22/2019 04/21/2019 04/20/2019  Weight (lbs) 174 lb 6.4 oz 174 lb 1.6 oz 174 lb 9.6 oz  Weight (kg) 79.107 kg 78.971 kg 79.198 kg      Telemetry    Sinus rhythm - Personally Reviewed  ECG    No new tracings for review  Physical Exam   GEN: No acute distress.   Neck: No JVD,  oxygen in place Cardiac: RRR, no murmurs, rubs, or gallops.  Respiratory: Clear to auscultation bilaterally. GI: Soft, nontender, non-distended  MS: 1+ pitting edema of bilateral  lower legs Neuro:  Nonfocal  Psych: Normal affect   Labs    High Sensitivity Troponin:   Recent Labs  Lab 04/18/19 1652 04/18/19 1841  TROPONINIHS 335* 349*      Chemistry Recent Labs  Lab 04/19/19 1955 04/20/19 0428 04/21/19 0350  NA 144 146* 144  K 3.3* 3.4* 3.6  CL 98 102 102  CO2 34* 33* 30  GLUCOSE 231* 203* 219*  BUN 44* 43* 44*  CREATININE 3.65* 3.67* 3.55*  CALCIUM 8.9 8.7* 8.6*  ALBUMIN  --  2.6* 2.3*  GFRNONAA 14* 14* 15*  GFRAA 17* 17* 17*  ANIONGAP 12 11 12      Hematology Recent Labs  Lab 04/20/19 0428 04/21/19 0350 04/22/19 0542  WBC 14.0* 12.0* 11.0*  RBC 3.44* 2.80* 2.97*  HGB 10.9* 9.0* 9.5*  HCT 35.0* 28.3* 30.1*  MCV 101.7* 101.1* 101.3*  MCH 31.7 32.1 32.0  MCHC 31.1 31.8 31.6  RDW 14.2 14.0 14.0  PLT 323 311 329    BNP Recent Labs  Lab 04/18/19 1652  BNP 1,406.6*     DDimer No results for input(s): DDIMER in the last 168 hours.   Radiology    No results  found.  Cardiac Studies   Echocardiogram 04/19/2019  1. Since last echo, estimated RV pressure has increased. Aortic stenosis  is unchanged.  2. Left ventricular ejection fraction, by estimation, is 60 to 65%. The  left ventricle has normal function. The left ventricle has no regional  wall motion abnormalities. There is mild left ventricular hypertrophy.  Left ventricular diastolic parameters  are consistent with Grade II diastolic dysfunction (pseudonormalization).  Elevated left atrial pressure.  3. Right ventricular systolic function is normal. The right ventricular  size is mildly enlarged. There is severely elevated pulmonary artery  systolic pressure. The estimated right ventricular systolic pressure is  37.8 mmHg.  4. Left atrial size was mild to moderately dilated.  5. Right atrial size was moderately dilated.  6. The mitral valve is normal in structure and function. Mild mitral  valve regurgitation. No evidence of mitral stenosis.  7. Tricuspid  valve regurgitation is moderate to severe.  8. The aortic valve is tricuspid. Aortic valve regurgitation is not  visualized. Mild to moderate aortic valve stenosis. Aortic valve area, by  VTI measures 1.18 cm. Aortic valve mean gradient measures 17.0 mmHg.  Aortic valve Vmax measures 2.84 m/s.  9. The inferior vena cava is dilated in size with >50% respiratory  variability, suggesting right atrial pressure of 8 mmHg.    Right/left heart catheterization 05/2017:  Prox RCA to Mid RCA lesion is 30% stenosed.  Mid RCA lesion is 40% stenosed.  Dist RCA lesion is 30% stenosed.  Ost LAD to Prox LAD lesion is 100% stenosed.  LIMA graft was visualized by angiography and is normal in caliber.  Ost Cx to Prox Cx lesion is 90% stenosed.  Hemodynamic findings consistent with mild pulmonary hypertension.  LV end diastolic pressure is normal.  1. Severe double vessel CAD s/p 3V CABG with 3/3 patent bypass grafts  2. The LAD has 100% proximal occlusion. The entire LAD fills from the patent LIMA graft. The Diagonal branch fills from the vein graft 3. The Circumflex has 90% proximal stenosis. The vein graft fills the large obtuse marginal graft.  4. The RCA is a large dominant vessel with mild diffuse calcific plaque in the proximal, mid and distal vessel but no obstructive lesions.  5. The LIMA to the LAD is patent 6 The sequential saphenous vein graft to the Diagonal and OM is patent with mild filling defects seen in the proximal, mid and distal segments of the body of the graft. These lesions do not appear to be flow limiting.  7. Mild aortic stenosis. Peak to peak gradient 4 mmHg. The valve was easily crossed.  8. Right and left heart filling pressures outline above.   Recommendations: Continue medical management of CAD. Continue aggressive blood pressure control and diuresis for diastolic CHF. Will hydrate for 6 hours post cath and plan discharge home later today.  Patient Profile       86 y.o. male with chronic kidney disease stage IV, type 2 diabetes, prostate cancer, hypertension, coronary disease status post CABG 1987 and chronic diastolic heart failure admitted with shortness of breath, mildly elevated troponin 335.  Covid negative.  Assessment & Plan    Acute on chronic diastolic heart failure -Likely pulmonary edema on chest x-ray, however pneumonia cannot be excluded or a combination of heart failure and pneumonia.  The patient did have a mild fever and WBCs up to 14.0.  He is being treated with ceftriaxone.  In the last 24 hours temp max was 99.1.  -  Echo done on 2/20 showed normal LV systolic function with EF 60-65%, no R WMA, mild LVH, grade 2 diastolic dysfunction.  There was increased estimated RV pressure. -Patient is being diuresed with Lasix 80 mg IV twice daily with good urine output.  1.75 L yesterday.  The patient is net -1.8 L since admission. -Continue IV diuresis for another day - then may switch to oral -Wean oxygen as tolerated  CAD -S/p CABG 1987 right/left heart cath 05/2017 showed 3/3 patent bypass grafts. -Patient continues on aspirin and statin. -No chest discomfort.  Hypertension -Blood pressure was elevated last night at 177/69.  Improved this morning to 137/63. -Patient has a history of orthostatic complaints with favoring permissive hypertension to minimize symptoms. -Continues on amlodipine, carvedilol, hydralazine and Lasix.  AKI/CKD stage V -Followed by nephrology. -Plans for initiation of peritoneal dialysis, PD training to start 05/05/2019. -Serum creatinine 4.36 > 3.55 -Potassium was initially low at 2.6.  Improved, 3.6 today. - appreciate nephrology recs - working toward possible d/c tomorrow  Hyperlipidemia, goal LDL <70 -On atorvastatin 40 mg -LDL is 56, at goal.  Continue current therapy.  Diabetes type 2 -A1c 7.3 -On sliding scale insulin while hospitalized  Anemia of CKD -Hemoglobin has drifted down from 11.4 to  9.0. -Followed by nephrology with plan for EPA.   Aortic stenosis -Noted to be mild on right/left heart cath in 05/2017.  Echo done on 2/20 showed no change in aortic stenosis, mild-mod.   Deconditioning - Ask PT to evaluate, up with assistance, wean oxygen to off as tolerated  For questions or updates, please contact [facility] Please consult [email or web address] for contact info under   Pixie Casino, MD, FACC, [place] Director of the [facility] Diplomate of the American Board of Clinical Lipidology Attending Cardiologist  Direct Dial: [phone]  Fax: [phone]  Website:  [email or web address]  Pixie Casino, MD  04/22/2019, 9:16 AM

## 2019-04-22 NOTE — Consult Note (Signed)
   [facility] CM Inpatient Consult   04/22/2019  [name] 1933-09-05 [id number]   Patient is currently active with [name] for chronic disease management services.  Patient has been engaged by a [place].   Our community based plan of care has focused on disease management and community resource support.   Chart review from MD history and Physical [04/18/2019] includes but not limited to and reveals the patient is:  86 years old black male with PMH of CKD, IV, type 2 DM, Prostate cancer, hypertension, CAD, CABG and chronic diastolic CHF had low oxygen saturation at home and in ED until supplemental oxygen is given. His initial troponin I is elevated at 335 ng. Patient deniers chest pain but admits to shortness of breath at rest and with activity. His COVID test is negative. Chest x-ray is positive for pulmonary edema or pneumonia. BNP is pending. He has CKD, IV and has peritoneal catheter placed but has not started dialysis. WBC count is elevated.  Patient will receive a post hospital call and will be evaluated for assessments and disease process education.    Plan:  To follow up with Inpatient Transition Of Care [TOC] team member to make aware that [name] following.   Of note, [name] services does not replace or interfere with any services that are needed or arranged by inpatient [name] care management team.  For additional questions or referrals please contact:  Natividad Brood, RN BSN [name]  [phone] business mobile phone Toll free office [phone]  Fax number: [phone] [email or web address]

## 2019-04-22 NOTE — Progress Notes (Signed)
Nutrition Brief Note  RD working remotely.  RD consulted for diet education in regards to diabetes. Per RN notes, pt concerned about pt's meal trays and high blood sugars in the hospital.   Spoke with pt wife over the phone, who reports that she is concerned that pt has been getting a lot of pasta, bread, and noodles on his meal trays and believes this is increasing his blood sugar. Pt wife acknowledges that pt is on oral hypoglycemic agents at home, however, has been receiving insulin in the hospital. Explained to pt wife that insulin is typically used in the hospital for tighter glycemic control; she acknowledged understanding  Reviewed menu options with wife and RD personally took pt's dinner order- soft cottage cheese and fruit plate, grapes, and diet [place]. Pt wife very appreciative of RD call.   Current diet order is carb modified, patient is consuming approximately 100% of meals at this time. Labs and medications reviewed.   No nutrition interventions warranted at this time. If nutrition issues arise, please consult RD.   Loistine Chance, RD, LDN, [place] Registered Dietitian II Certified Diabetes Care and Education Specialist Please refer to [facility] for RD and/or RD on-call/weekend/after hours pager

## 2019-04-22 NOTE — Progress Notes (Signed)
Inpatient Diabetes Program Recommendations  AACE/ADA: New Consensus Statement on Inpatient Glycemic Control (2015)  Target Ranges:  Prepandial:   less than 140 mg/dL      Peak postprandial:   less than 180 mg/dL (1-2 hours)      Critically ill patients:  140 - 180 mg/dL   Lab Results  Component Value Date   GLUCAP 221 (H) 04/22/2019   HGBA1C 7.3 (H) 04/21/2019    Review of Glycemic Control Results for [name], [name] (MRN [id number]) as of 04/22/2019 10:53  Ref. Range 04/21/2019 05:58 04/21/2019 11:32 04/21/2019 16:07 04/21/2019 21:57 04/22/2019 06:19  Glucose-Capillary Latest Ref Range: 70 - 99 mg/dL 200 (H) 287 (H) 309 (H) 190 (H) 221 (H)   Diabetes history: DM2 Outpatient Diabetes medications: Amaryl 4 mg QD + Trajenta 5 mg QD Current orders for Inpatient glycemic control: Novolog 0-6 TID + 0-5 QHS  Inpatient Diabetes Program Recommendations:     Noted that CBG's are consistently elevated  -Please consider Levemir 8 units BID  -Novolog 3 units TID with meals if eats at least 50% of meal  Thank you, Reche Dixon, RN, BSN Diabetes Coordinator Inpatient Diabetes Program [phone] (team pager from 8a-5p)

## 2019-04-22 NOTE — Plan of Care (Signed)
  Problem: Education: Goal: Knowledge of General Education information will improve Description: Including pain rating scale, medication(s)/side effects and non-pharmacologic comfort measures Outcome: Progressing   Problem: Health Behavior/Discharge Planning: Goal: Ability to manage health-related needs will improve Outcome: Progressing   Problem: Clinical Measurements: Goal: Ability to maintain clinical measurements within normal limits will improve Outcome: Progressing Goal: Will remain free from infection Outcome: Progressing Goal: Diagnostic test results will improve Outcome: Progressing Goal: Respiratory complications will improve Outcome: Progressing Goal: Cardiovascular complication will be avoided Outcome: Progressing   Problem: Activity: Goal: Risk for activity intolerance will decrease Outcome: Progressing   Problem: Elimination: Goal: Will not experience complications related to bowel motility Outcome: Progressing   Problem: Skin Integrity: Goal: Risk for impaired skin integrity will decrease Outcome: Progressing   Problem: Skin Integrity: Goal: Risk for impaired skin integrity will decrease Outcome: Progressing

## 2019-04-22 NOTE — Evaluation (Signed)
Physical Therapy Evaluation Patient Details Name: [name] MRN: [id number] DOB: 10-Jun-1933 Today's Date: 04/22/2019   History of Present Illness  Pt is an 86 y/o male admitted secondary to worsening shortness of breath; thought to be secondary to [facility]. Pt with new PD catheter and plans to start in March. PMH includes CAD s/p CABG, HTN, CKD, DM, and prostate cancer.   Clinical Impression  Pt admitted secondary to problem above with deficits below. Pt requiring min guard A for mobility using RW. Pt with increased fatigue which limited distance. Reports this has been baseline; reports he plans to start PD in March. Feel pt would benefit from HHPT to increase independence with mobility. Will continue to follow acutely to maximize functional mobility independence and safety.     Follow Up Recommendations Home health PT;Supervision for mobility/OOB    Equipment Recommendations  None recommended by PT    Recommendations for Other Services OT consult     Precautions / Restrictions Precautions Precautions: Fall Restrictions Weight Bearing Restrictions: No      Mobility  Bed Mobility Overal bed mobility: Needs Assistance Bed Mobility: Supine to Sit     Supine to sit: Supervision     General bed mobility comments: supervision for safety  Transfers Overall transfer level: Needs assistance Equipment used: Rolling walker (2 wheeled) Transfers: Sit to/from Stand Sit to Stand: Min guard;From elevated surface         General transfer comment: Min guard for safety. Cues for safe hand placement.   Ambulation/Gait Ambulation/Gait assistance: Min guard Gait Distance (Feet): 20 Feet Assistive device: Rolling walker (2 wheeled) Gait Pattern/deviations: Step-through pattern;Decreased stride length Gait velocity: Decreased   General Gait Details: Very slow, cautious gait, however, overall steady. Pt with increased fatigue which limited ambulation distance.   Stairs            Wheelchair Mobility    Modified Rankin (Stroke Patients Only)       Balance Overall balance assessment: Needs assistance Sitting-balance support: No upper extremity supported;Feet supported Sitting balance-Leahy Scale: Good     Standing balance support: Bilateral upper extremity supported;During functional activity Standing balance-Leahy Scale: Poor Standing balance comment: Reliant on BUE support                              Pertinent Vitals/Pain Pain Assessment: No/denies pain    Home Living Family/patient expects to be discharged to:: Private residence Living Arrangements: Spouse/significant other Available Help at Discharge: Family;Available 24 hours/day Type of Home: House Home Access: Stairs to enter Entrance Stairs-Rails: Right Entrance Stairs-Number of Steps: 8-9 Home Layout: One level Home Equipment: Environmental consultant - 4 wheels      Prior Function Level of Independence: Independent with assistive device(s)         Comments: Was using rollator for ambulation      Hand Dominance        Extremity/Trunk Assessment   Upper Extremity Assessment Upper Extremity Assessment: Defer to OT evaluation    Lower Extremity Assessment Lower Extremity Assessment: Generalized weakness    Cervical / Trunk Assessment Cervical / Trunk Assessment: Normal  Communication   Communication: HOH  Cognition Arousal/Alertness: Awake/alert Behavior During Therapy: WFL for tasks assessed/performed Overall Cognitive Status: No family/caregiver present to determine baseline cognitive functioning  General Comments: Pt with somewhat slowed processing.       General Comments      Exercises     Assessment/Plan    PT Assessment Patient needs continued PT services  PT Problem List Decreased strength;Decreased activity tolerance;Decreased mobility;Decreased knowledge of use of DME;Decreased cognition       PT Treatment  Interventions Gait training;Stair training;Therapeutic activities;Functional mobility training;DME instruction;Therapeutic exercise;Balance training;Patient/family education    PT Goals (Current goals can be found in the Care Plan section)  Acute Rehab PT Goals Patient Stated Goal: to start dialysis  PT Goal Formulation: With patient Time For Goal Achievement: 05/06/19 Potential to Achieve Goals: Good    Frequency Min 3X/week   Barriers to discharge        Co-evaluation               AM-PAC PT "6 Clicks" Mobility  Outcome Measure Help needed turning from your back to your side while in a flat bed without using bedrails?: None Help needed moving from lying on your back to sitting on the side of a flat bed without using bedrails?: A Little Help needed moving to and from a bed to a chair (including a wheelchair)?: A Little Help needed standing up from a chair using your arms (e.g., wheelchair or bedside chair)?: A Little Help needed to walk in hospital room?: A Little Help needed climbing 3-5 steps with a railing? : A Lot 6 Click Score: 18    End of Session Equipment Utilized During Treatment: Gait belt;Oxygen Activity Tolerance: Patient limited by fatigue Patient left: in chair;with call bell/phone within reach Nurse Communication: Mobility status PT Visit Diagnosis: Other abnormalities of gait and mobility (R26.89);Muscle weakness (generalized) (M62.81)    Time: 6270-3500 PT Time Calculation (min) (ACUTE ONLY): 26 min   Charges:   PT Evaluation $PT Eval Moderate Complexity: 1 Mod PT Treatments $Gait Training: 8-22 mins        Lou Miner, DPT  Acute Rehabilitation Services  Pager: [phone] Office: [phone]   Rudean Hitt 04/22/2019, 11:38 AM

## 2019-04-23 DIAGNOSIS — Z992 Dependence on renal dialysis: Secondary | ICD-10-CM

## 2019-04-23 DIAGNOSIS — I249 Acute ischemic heart disease, unspecified: Secondary | ICD-10-CM

## 2019-04-23 DIAGNOSIS — I5030 Unspecified diastolic (congestive) heart failure: Secondary | ICD-10-CM

## 2019-04-23 LAB — CBC
HCT: 33.6 % — ABNORMAL LOW (ref 39.0–52.0)
Hemoglobin: 10.6 g/dL — ABNORMAL LOW (ref 13.0–17.0)
MCH: 31.6 pg (ref 26.0–34.0)
MCHC: 31.5 g/dL (ref 30.0–36.0)
MCV: 100.3 fL — ABNORMAL HIGH (ref 80.0–100.0)
Platelets: 370 10*3/uL (ref 150–400)
RBC: 3.35 MIL/uL — ABNORMAL LOW (ref 4.22–5.81)
RDW: 13.8 % (ref 11.5–15.5)
WBC: 10.3 10*3/uL (ref 4.0–10.5)
nRBC: 0 % (ref 0.0–0.2)

## 2019-04-23 LAB — BASIC METABOLIC PANEL
Anion gap: 12 (ref 5–15)
BUN: 46 mg/dL — ABNORMAL HIGH (ref 8–23)
CO2: 28 mmol/L (ref 22–32)
Calcium: 9 mg/dL (ref 8.9–10.3)
Chloride: 102 mmol/L (ref 98–111)
Creatinine, Ser: 3.35 mg/dL — ABNORMAL HIGH (ref 0.61–1.24)
GFR calc Af Amer: 19 mL/min — ABNORMAL LOW (ref >60)
GFR calc non Af Amer: 16 mL/min — ABNORMAL LOW (ref >60)
Glucose, Bld: 96 mg/dL (ref 70–99)
Potassium: 3.7 mmol/L (ref 3.5–5.1)
Sodium: 142 mmol/L (ref 135–145)

## 2019-04-23 LAB — GLUCOSE, CAPILLARY
Glucose-Capillary: 90 mg/dL (ref 70–99)
Glucose-Capillary: 99 mg/dL (ref 70–99)
Glucose-Capillary: 110 mg/dL — ABNORMAL HIGH (ref 70–99)

## 2019-04-23 MED ORDER — FUROSEMIDE 80 MG PO TABS: 80.0000 mg | ORAL_TABLET | Freq: Two times a day (BID) | ORAL | Status: DC

## 2019-04-23 MED ORDER — METOLAZONE 5 MG PO TABS
5.0000 mg | ORAL_TABLET | ORAL | Status: AC
  Administered 2019-04-23: 5 mg via ORAL
  Filled 2019-04-23: qty 1

## 2019-04-23 MED ORDER — METOLAZONE 2.5 MG PO TABS: 2.5000 mg | ORAL_TABLET | Freq: Once | ORAL | Status: DC

## 2019-04-23 MED ORDER — FUROSEMIDE 10 MG/ML IJ SOLN
80.0000 mg | Freq: Once | INTRAMUSCULAR | Status: AC
  Administered 2019-04-23: 80 mg via INTRAVENOUS
  Filled 2019-04-23: qty 8

## 2019-04-23 MED ORDER — FUROSEMIDE 80 MG PO TABS
80.0000 mg | ORAL_TABLET | Freq: Two times a day (BID) | ORAL | Status: DC
  Filled 2019-04-23: qty 1

## 2019-04-23 NOTE — Progress Notes (Signed)
To the best of my knowledge, the student's charting is accurate.  

## 2019-04-23 NOTE — Progress Notes (Signed)
Nephrology Progress Note:  Patient ID: [name], male   DOB: 11-May-1933, 86 y.o.   MRN: [id number]   S:  Had 3 liters UOP over 2/23.  States he ambulated to rest room and back without trouble breathing.  He has been on 2 liters oxygen.  Got lasix 80 mg IV this AM.  Review of systems:   Shortness of breath much better Good urine output  No n/v    O:BP (!) 141/63 (BP Location: Right Arm)   Pulse 62   Temp 98.3 F (36.8 C) (Oral)   Resp 17   Ht 5\' 9"  (1.753 m)   Wt 79.3 kg   SpO2 100%   BMI 25.81 kg/m   Intake/Output Summary (Last 24 hours) at 04/23/2019 1110 Last data filed at 04/23/2019 0840 Gross per 24 hour  Intake 1060 ml  Output 2950 ml  Net -1890 ml   Intake/Output: I/O last 3 completed shifts: In: 1060 [P.O.:960; IV Piggyback:100] Out: 2025 [Urine:3950]  Intake/Output this shift:  Total I/O In: 240 [P.O.:240] Out: -  Weight change: 0.181 kg   Physical Exam:   Gen: adult male in bed in NAD CVS: S1S2 no rub Resp: cta on 2 liters oxygen; unlabored at rest Abd: soft, NT/ND; PD catheter insertion site without bleeding. Dressing dry. Ext: 2+ edema Neuro - alert and oriented x 3; follows commands and provides hx Psych normal mood and affect  Recent Labs  Lab 04/18/19 1652 04/19/19 0332 04/19/19 1955 04/20/19 0428 04/21/19 0350 04/22/19 0542 04/23/19 0503  NA 142 147* 144 146* 144 144 142  K 2.6* 2.9* 3.3* 3.4* 3.6 3.7 3.7  CL 89* 98 98 102 102 102 102  CO2 37* 34* 34* 33* 30 30 28   GLUCOSE 309* 227* 231* 203* 219* 239* 96  BUN 53* 48* 44* 43* 44* 45* 46*  CREATININE 4.36* 4.01* 3.65* 3.67* 3.55* 3.38* 3.35*  ALBUMIN  --   --   --  2.6* 2.3* 2.3*  --   CALCIUM 8.7* 8.3* 8.9 8.7* 8.6* 8.9 9.0  PHOS  --   --   --  2.9 3.1 3.5  --    Liver Function Tests: Recent Labs  Lab 04/20/19 0428 04/21/19 0350 04/22/19 0542  ALBUMIN 2.6* 2.3* 2.3*   CBC: Recent Labs  Lab 04/18/19 1652 04/18/19 1652 04/19/19 0332 04/19/19 0332 04/20/19 0428  04/20/19 0428 04/21/19 0350 04/22/19 0542 04/23/19 0503  WBC 12.4*   < > 13.4*   < > 14.0*   < > 12.0* 11.0* 10.3  NEUTROABS 9.9*  --   --   --   --   --   --   --   --   HGB 11.4*   < > 10.5*   < > 10.9*   < > 9.0* 9.5* 10.6*  HCT 36.7*   < > 33.2*   < > 35.0*   < > 28.3* 30.1* 33.6*  MCV 102.5*   < > 101.2*  --  101.7*  --  101.1* 101.3* 100.3*  PLT 336   < > 295   < > 323   < > 311 329 370   < > = values in this interval not displayed.   Cardiac Enzymes: No results for input(s): CKTOTAL, CKMB, CKMBINDEX, TROPONINI in the last 168 hours. CBG: Recent Labs  Lab 04/22/19 0619 04/22/19 1112 04/22/19 1638 04/22/19 2110 04/23/19 0618  GLUCAP 221* 192* 183* 158* 90    Studies/Results: No results found. Marland Kitchen amLODipine  10 mg Oral QHS  . aspirin EC  81 mg Oral Daily  . atorvastatin  40 mg Oral QHS  . carvedilol  3.125 mg Oral BID WC  . folic acid  1 mg Oral Daily  . furosemide  80 mg Intravenous BID  . hydrALAZINE  50 mg Oral TID  . insulin aspart  0-5 Units Subcutaneous QHS  . insulin aspart  0-6 Units Subcutaneous TID WC  . insulin aspart  3 Units Subcutaneous TID WC  . insulin detemir  8 Units Subcutaneous BID  . mouth rinse  15 mL Mouth Rinse BID  . multivitamin with minerals  1 tablet Oral Daily    BMET    Component Value Date/Time   NA 142 04/23/2019 0503   NA 143 10/12/2017 1126   K 3.7 04/23/2019 0503   CL 102 04/23/2019 0503   CO2 28 04/23/2019 0503   GLUCOSE 96 04/23/2019 0503   BUN 46 (H) 04/23/2019 0503   BUN 38 (H) 10/12/2017 1126   CREATININE 3.35 (H) 04/23/2019 0503   CALCIUM 9.0 04/23/2019 0503   GFRNONAA 16 (L) 04/23/2019 0503   GFRAA 19 (L) 04/23/2019 0503   CBC    Component Value Date/Time   WBC 10.3 04/23/2019 0503   RBC 3.35 (L) 04/23/2019 0503   HGB 10.6 (L) 04/23/2019 0503   HGB 13.2 06/19/2017 0940   HCT 33.6 (L) 04/23/2019 0503   HCT 38.1 06/19/2017 0940   PLT 370 04/23/2019 0503   PLT 201 06/19/2017 0940   MCV 100.3 (H) 04/23/2019  0503   MCV 93 06/19/2017 0940   MCH 31.6 04/23/2019 0503   MCHC 31.5 04/23/2019 0503   RDW 13.8 04/23/2019 0503   RDW 14.1 06/19/2017 0940   LYMPHSABS 1.5 04/18/2019 1652   MONOABS 0.8 04/18/2019 1652   EOSABS 0.1 04/18/2019 1652   BASOSABS 0.0 04/18/2019 1652    Assessment/Plan: 1.  AKI/CKD stage V- in setting of decompensated CHF and diuresis.  His Cr is slightly improved from yesterday but still above his baseline.  He is without uremic symptoms and is planning to start PD training on 05/05/19. 1. Cr stable/improving and without uremic symptoms   2. Acute diastolic CHF- improving with diuretics.  Transitioning to lasix 80 mg PO BID.  Would give metolazone 5 mg PO once before tonight's lasix dose if possible.  3. Acute hypoxic respiratory failure- thought to be due to pulmonary edema.  On lasix. abx per primary 4. Bleeding from PD catheter exit site- resolved; he is s/p DDAVP  5. CAD s/p CABG- stable 6. Hypokalemia- improved/stable  7. Anemia of CKD- initiated ESA. (received 40 mcg on 2/22) 8. HTN- acceptable control  9. DM per primary.  dispo - cardiology planning for likely discharge today.  They are ambulating patient to get ambulatory oxygen sats and direct dispo.  Will set up follow-up with [facility] for one week post-discharge.  Asked pt to contact home training.   Claudia Desanctis, MD 04/23/2019 11:38 AM

## 2019-04-23 NOTE — Progress Notes (Signed)
SATURATION QUALIFICATIONS: (This note is used to comply with regulatory documentation for home oxygen)  Patient Saturations on Room Air at Rest = 94% on 2L  Patient Saturations on Room Air while Ambulating = 87% on RA  Patient Saturations on 2 Liters of oxygen while Ambulating = 91-94%  Please briefly explain why patient needs home oxygen: Pt requiring O2 as pt unable to sustain >90% O2 without supplemental O2.  Jefferey Pica, OTR/L Acute Rehabilitation Services Pager: [phone] Office: [phone]

## 2019-04-23 NOTE — Progress Notes (Signed)
   Some hypoxemia with ambulation - will give metolazone and additional IV lasix tonight and re-evaluate in the am tomorrow - hold on d/c pending clinical improvement. Goal is to not send home on oxygen.  Pixie Casino, MD, [facility], [place] Director of the Advanced Lipid Disorders &  Cardiovascular Risk Reduction Clinic Diplomate of the American Board of Clinical Lipidology Attending Cardiologist  Direct Dial: [phone]  Fax: [phone]  Website:  [email or web address]

## 2019-04-23 NOTE — Progress Notes (Signed)
Physical Therapy Treatment Patient Details Name: [name] MRN: [id number] DOB: 03-24-1933 Today's Date: 04/23/2019    History of Present Illness Pt is an 86 y/o male admitted secondary to worsening shortness of breath; thought to be secondary to [facility] Pt with new PD catheter and plans to start in March. PMH includes CAD s/p CABG, HTN, CKD, DM, and prostate cancer.     PT Comments    Pt making steady progress with functional mobility. He tolerated ambulating a further distance this session than previous and with less support from therapist. Pt on RA throughout with SPO2 decreasing to 87-88% with activity. Pt would continue to benefit from skilled physical therapy services at this time while admitted and after d/c to address the below listed limitations in order to improve overall safety and independence with functional mobility.    Follow Up Recommendations  Home health PT;Supervision for mobility/OOB     Equipment Recommendations  None recommended by PT    Recommendations for Other Services       Precautions / Restrictions Precautions Precautions: Fall Restrictions Weight Bearing Restrictions: No    Mobility  Bed Mobility               General bed mobility comments: pt OOB in recliner chair upon arrival  Transfers Overall transfer level: Needs assistance Equipment used: Rolling walker (2 wheeled) Transfers: Sit to/from Stand Sit to Stand: Supervision         General transfer comment: supervision for safety to stand from recliner  Ambulation/Gait Ambulation/Gait assistance: Min guard;Supervision Gait Distance (Feet): 150 Feet Assistive device: Rolling walker (2 wheeled) Gait Pattern/deviations: Step-through pattern;Decreased stride length Gait velocity: Decreased   General Gait Details: pt overall steady with RW, no LOB or need for physical assistance   Stairs             Wheelchair Mobility    Modified Rankin (Stroke Patients Only)        Balance Overall balance assessment: Needs assistance Sitting-balance support: No upper extremity supported;Feet supported Sitting balance-Leahy Scale: Good     Standing balance support: Bilateral upper extremity supported;During functional activity Standing balance-Leahy Scale: Poor                              Cognition Arousal/Alertness: Awake/alert Behavior During Therapy: WFL for tasks assessed/performed Overall Cognitive Status: Within Functional Limits for tasks assessed                                        Exercises General Exercises - Lower Extremity Ankle Circles/Pumps: AROM;Both;20 reps;Seated Long Arc Quad: AROM;Strengthening;Both;10 reps;Seated Hip ABduction/ADduction: AROM;Strengthening;Both;10 reps;Standing Hip Flexion/Marching: AROM;Strengthening;Both;10 reps;Standing Mini-Sqauts: AROM;10 reps    General Comments        Pertinent Vitals/Pain Pain Assessment: No/denies pain    Home Living                      Prior Function            PT Goals (current goals can now be found in the care plan section) Acute Rehab PT Goals PT Goal Formulation: With patient Time For Goal Achievement: 05/06/19 Potential to Achieve Goals: Good Progress towards PT goals: Progressing toward goals    Frequency    Min 3X/week      PT Plan Current plan remains appropriate  Co-evaluation              AM-PAC PT "6 Clicks" Mobility   Outcome Measure  Help needed turning from your back to your side while in a flat bed without using bedrails?: None Help needed moving from lying on your back to sitting on the side of a flat bed without using bedrails?: A Little Help needed moving to and from a bed to a chair (including a wheelchair)?: A Little Help needed standing up from a chair using your arms (e.g., wheelchair or bedside chair)?: None Help needed to walk in hospital room?: A Little Help needed climbing 3-5 steps  with a railing? : A Little 6 Click Score: 20    End of Session   Activity Tolerance: Patient tolerated treatment well Patient left: with call bell/phone within reach;with family/visitor present;Other (comment)(seated on toilet) Nurse Communication: Mobility status PT Visit Diagnosis: Other abnormalities of gait and mobility (R26.89);Muscle weakness (generalized) (M62.81)     Time: 1595-3967 PT Time Calculation (min) (ACUTE ONLY): 19 min  Charges:  $Gait Training: 8-22 mins                     Anastasio Champion, DPT  Acute Rehabilitation Services Pager [phone] Office [place] 04/23/2019, 4:54 PM

## 2019-04-23 NOTE — Progress Notes (Signed)
Progress Note  Patient Name: [name] Date of Encounter: 04/23/2019  Primary Cardiologist: Pixie Casino, MD   Subjective   Net negative another 1.3L overnight - more alert today. Does not remember seeing me yesterday but recognized me today. Weaned down to 2L [place].  Inpatient Medications    Scheduled Meds: . amLODipine  10 mg Oral QHS  . aspirin EC  81 mg Oral Daily  . atorvastatin  40 mg Oral QHS  . carvedilol  3.125 mg Oral BID WC  . folic acid  1 mg Oral Daily  . furosemide  80 mg Intravenous BID  . hydrALAZINE  50 mg Oral TID  . insulin aspart  0-5 Units Subcutaneous QHS  . insulin aspart  0-6 Units Subcutaneous TID WC  . insulin aspart  3 Units Subcutaneous TID WC  . insulin detemir  8 Units Subcutaneous BID  . mouth rinse  15 mL Mouth Rinse BID  . multivitamin with minerals  1 tablet Oral Daily   Continuous Infusions: . cefTRIAXone (ROCEPHIN)  IV 1 g (04/22/19 2030)   PRN Meds: acetaminophen, bisacodyl, docusate sodium, hydroxypropyl methylcellulose / hypromellose, nitroGLYCERIN, ondansetron (ZOFRAN) IV, traZODone   Vital Signs    Vitals:   04/22/19 1937 04/23/19 0555 04/23/19 0609 04/23/19 0925  BP: (!) 137/59 130/61 130/61 (!) 141/63  Pulse: (!) 56 (!) 55 (!) 57 62  Resp: 20 20 20 17   Temp: 98.7 F (37.1 C) 98.3 F (36.8 C) 98.3 F (36.8 C)   TempSrc: Oral Oral Oral   SpO2: 100% 100% 100% 100%  Weight:   79.3 kg   Height:        Intake/Output Summary (Last 24 hours) at 04/23/2019 1124 Last data filed at 04/23/2019 0840 Gross per 24 hour  Intake 1060 ml  Output 2950 ml  Net -1890 ml   Last 3 Weights 04/23/2019 04/22/2019 04/21/2019  Weight (lbs) 174 lb 12.8 oz 174 lb 6.4 oz 174 lb 1.6 oz  Weight (kg) 79.289 kg 79.107 kg 78.971 kg      Telemetry    Sinus rhythm - Personally Reviewed  ECG    No new tracings for review  Physical Exam   GEN: No acute distress.   Neck: No JVD, [place] oxygen in place Cardiac: RRR, no murmurs, rubs, or  gallops.  Respiratory: Clear to auscultation bilaterally. GI: Soft, nontender, non-distended  MS: 1+ pitting edema of bilateral lower legs Neuro:  Nonfocal  Psych: Normal affect   Labs    High Sensitivity Troponin:   Recent Labs  Lab 04/18/19 1652 04/18/19 1841  TROPONINIHS 335* 349*      Chemistry Recent Labs  Lab 04/20/19 0428 04/20/19 0428 04/21/19 0350 04/22/19 0542 04/23/19 0503  NA 146*   < > 144 144 142  K 3.4*   < > 3.6 3.7 3.7  CL 102   < > 102 102 102  CO2 33*   < > 30 30 28   GLUCOSE 203*   < > 219* 239* 96  BUN 43*   < > 44* 45* 46*  CREATININE 3.67*   < > 3.55* 3.38* 3.35*  CALCIUM 8.7*   < > 8.6* 8.9 9.0  ALBUMIN 2.6*  --  2.3* 2.3*  --   GFRNONAA 14*   < > 15* 16* 16*  GFRAA 17*   < > 17* 18* 19*  ANIONGAP 11   < > 12 12 12    < > = values in this interval not displayed.  Hematology Recent Labs  Lab 04/21/19 0350 04/22/19 0542 04/23/19 0503  WBC 12.0* 11.0* 10.3  RBC 2.80* 2.97* 3.35*  HGB 9.0* 9.5* 10.6*  HCT 28.3* 30.1* 33.6*  MCV 101.1* 101.3* 100.3*  MCH 32.1 32.0 31.6  MCHC 31.8 31.6 31.5  RDW 14.0 14.0 13.8  PLT 311 329 370    BNP Recent Labs  Lab 04/18/19 1652  BNP 1,406.6*     DDimer No results for input(s): DDIMER in the last 168 hours.   Radiology    No results found.  Cardiac Studies   Echocardiogram 04/19/2019  1. Since last echo, estimated RV pressure has increased. Aortic stenosis  is unchanged.  2. Left ventricular ejection fraction, by estimation, is 60 to 65%. The  left ventricle has normal function. The left ventricle has no regional  wall motion abnormalities. There is mild left ventricular hypertrophy.  Left ventricular diastolic parameters  are consistent with Grade II diastolic dysfunction (pseudonormalization).  Elevated left atrial pressure.  3. Right ventricular systolic function is normal. The right ventricular  size is mildly enlarged. There is severely elevated pulmonary artery  systolic  pressure. The estimated right ventricular systolic pressure is  99.2 mmHg.  4. Left atrial size was mild to moderately dilated.  5. Right atrial size was moderately dilated.  6. The mitral valve is normal in structure and function. Mild mitral  valve regurgitation. No evidence of mitral stenosis.  7. Tricuspid valve regurgitation is moderate to severe.  8. The aortic valve is tricuspid. Aortic valve regurgitation is not  visualized. Mild to moderate aortic valve stenosis. Aortic valve area, by  VTI measures 1.18 cm. Aortic valve mean gradient measures 17.0 mmHg.  Aortic valve Vmax measures 2.84 m/s.  9. The inferior vena cava is dilated in size with >50% respiratory  variability, suggesting right atrial pressure of 8 mmHg.    Right/left heart catheterization 05/2017:  Prox RCA to Mid RCA lesion is 30% stenosed.  Mid RCA lesion is 40% stenosed.  Dist RCA lesion is 30% stenosed.  Ost LAD to Prox LAD lesion is 100% stenosed.  LIMA graft was visualized by angiography and is normal in caliber.  Ost Cx to Prox Cx lesion is 90% stenosed.  Hemodynamic findings consistent with mild pulmonary hypertension.  LV end diastolic pressure is normal.  1. Severe double vessel CAD s/p 3V CABG with 3/3 patent bypass grafts  2. The LAD has 100% proximal occlusion. The entire LAD fills from the patent LIMA graft. The Diagonal branch fills from the vein graft 3. The Circumflex has 90% proximal stenosis. The vein graft fills the large obtuse marginal graft.  4. The RCA is a large dominant vessel with mild diffuse calcific plaque in the proximal, mid and distal vessel but no obstructive lesions.  5. The LIMA to the LAD is patent 6 The sequential saphenous vein graft to the Diagonal and OM is patent with mild filling defects seen in the proximal, mid and distal segments of the body of the graft. These lesions do not appear to be flow limiting.  7. Mild aortic stenosis. Peak to peak gradient 4  mmHg. The valve was easily crossed.  8. Right and left heart filling pressures outline above.   Recommendations: Continue medical management of CAD. Continue aggressive blood pressure control and diuresis for diastolic CHF. Will hydrate for 6 hours post cath and plan discharge home later today.  Patient Profile     86 y.o. male with chronic kidney disease stage IV, type  2 diabetes, prostate cancer, hypertension, coronary disease status post CABG 1987 and chronic diastolic heart failure admitted with shortness of breath, mildly elevated troponin 335.  Covid negative.  Assessment & Plan    Acute on chronic diastolic heart failure -Likely pulmonary edema on chest x-ray, however pneumonia cannot be excluded or a combination of heart failure and pneumonia.  The patient did have a mild fever and WBCs up to 14.0.  He is being treated with ceftriaxone.  In the last 24 hours temp max was 99.1.  -Echo done on 2/20 showed normal LV systolic function with EF 60-65%, no R WMA, mild LVH, grade 2 diastolic dysfunction.  There was increased estimated RV pressure. -Patient is being diuresed with Lasix 80 mg IV twice daily with good urine output.  1.75 L yesterday.  The patient is net -1.8 L since admission. -Further diuresis overnight - now 5L negative, switch to lasix 80 mg po BID -Wean oxygen as tolerated, obtain ambulatory O2 sat today off oxygen.  CAD -S/p CABG 1987 right/left heart cath 05/2017 showed 3/3 patent bypass grafts. -Patient continues on aspirin and statin. -No chest discomfort.  Hypertension -Blood pressure was elevated last night at 177/69.  Improved this morning to 137/63. -Patient has a history of orthostatic complaints with favoring permissive hypertension to minimize symptoms. -Continues on amlodipine, carvedilol, hydralazine and Lasix.  AKI/CKD stage V -Followed by nephrology. -Plans for initiation of peritoneal dialysis, PD training to start 05/05/2019. -Serum creatinine 4.36 >  3.55 -Potassium was initially low at 2.6.  Improved, 3.6 today. - appreciate nephrology recs  Hyperlipidemia, goal LDL <70 -On atorvastatin 40 mg -LDL is 56, at goal.  Continue current therapy.  Diabetes type 2 -A1c 7.3 -On sliding scale insulin while hospitalized  Anemia of CKD -Hemoglobin has drifted down from 11.4 to 9.0. -Followed by nephrology with plan for EPA.   Aortic stenosis -Noted to be mild on right/left heart cath in 05/2017.  Echo done on 2/20 showed no change in aortic stenosis, mild-mod.   Deconditioning - PT recommends home health PT, will arrange after d/c.  Possible d/c later today or tomorrow - may need additional diuresis.  For questions or updates, please contact [place] Please consult [email or web address] for contact info under   Pixie Casino, MD, FACC, [place] Director of the [facility] Diplomate of the American Board of Clinical Lipidology Attending Cardiologist  Direct Dial: [phone]  Fax: [phone]  Website:  [email or web address]  Pixie Casino, MD  04/23/2019, 11:24 AM

## 2019-04-23 NOTE — Evaluation (Signed)
Occupational Therapy Evaluation Patient Details Name: [name] MRN: [id number] DOB: 10/07/1933 Today's Date: 04/23/2019    History of Present Illness Pt is an 86 y/o male admitted secondary to worsening shortness of breath; thought to be secondary to [facility]. Pt with new PD catheter and plans to start in March. PMH includes CAD s/p CABG, HTN, CKD, DM, and prostate cancer.    Clinical Impression   Pt PTA: Pt living with spouse and performing own ADL and mobility. Pt currently limited by SOB, fatigue and decreased endurance. Pt SupervisionA to minguardA for ADL and minguardA for mobility with RW. Pt stood at sink for grooming. Pt appeared SOB throughout session. O2 desatting x2 times to 87% on RA. Pt able to bring up to 89% with pursed lip breathing, but unable to sustain >90% O2 without supplemental assist. Pt sustaining >93% O2 on 2LO2 [place]. Pt would benefit from continued OT skilled services for ADL, mobility and safety. OT following acutely for energy conservation.    Follow Up Recommendations  Home health OT    Equipment Recommendations  None recommended by OT    Recommendations for Other Services       Precautions / Restrictions Precautions Precautions: Fall Restrictions Weight Bearing Restrictions: No      Mobility Bed Mobility Overal bed mobility: Needs Assistance Bed Mobility: Supine to Sit     Supine to sit: Supervision     General bed mobility comments: supervision for safety  Transfers Overall transfer level: Needs assistance Equipment used: Rolling walker (2 wheeled) Transfers: Sit to/from Stand Sit to Stand: Supervision         General transfer comment: Sit to stand from recliner    Balance Overall balance assessment: Needs assistance Sitting-balance support: No upper extremity supported;Feet supported Sitting balance-Leahy Scale: Good     Standing balance support: Bilateral upper extremity supported;During functional activity Standing  balance-Leahy Scale: Poor Standing balance comment: Reliant on BUE support                            ADL either performed or assessed with clinical judgement   ADL Overall ADL's : Needs assistance/impaired Eating/Feeding: Set up;Sitting   Grooming: Supervision/safety;Standing   Upper Body Bathing: Supervision/ safety;Standing   Lower Body Bathing: Min guard;Sitting/lateral leans;Sit to/from stand;Cueing for safety   Upper Body Dressing : Supervision/safety;Standing   Lower Body Dressing: Min guard;Cueing for safety;Sitting/lateral leans;Sit to/from stand   Toilet Transfer: Min guard;Cueing for safety;RW   Toileting- Water quality scientist and Hygiene: Min guard;Cueing for safety;Sitting/lateral lean;Sit to/from stand       Functional mobility during ADLs: Min guard;Rolling walker General ADL Comments: Pt limited by SOB, fatigue and decreased endurance.     Vision Baseline Vision/History: Wears glasses Wears Glasses: Reading only Patient Visual Report: No change from baseline Vision Assessment?: No apparent visual deficits     Perception     Praxis      Pertinent Vitals/Pain Pain Assessment: No/denies pain     Hand Dominance Right   Extremity/Trunk Assessment Upper Extremity Assessment Upper Extremity Assessment: Generalized weakness   Lower Extremity Assessment Lower Extremity Assessment: Generalized weakness   Cervical / Trunk Assessment Cervical / Trunk Assessment: Normal   Communication Communication Communication: HOH   Cognition Arousal/Alertness: Awake/alert Behavior During Therapy: WFL for tasks assessed/performed Overall Cognitive Status: Within Functional Limits for tasks assessed  General Comments: Pt talkative and wanting to know about his condition   General Comments  O2 desatting x2 times to 87% on RA. Pt able to bring up to 89% with pursed lip breathing, but unable to sustain >90% O2  without supplemental assist. Pt sustaining >93% O2 on 2LO2 [place].    Exercises     Shoulder Instructions      Home Living Family/patient expects to be discharged to:: Private residence Living Arrangements: Spouse/significant other Available Help at Discharge: Family;Available 24 hours/day Type of Home: House Home Access: Stairs to enter [employer] of Steps: 8-9 Entrance Stairs-Rails: Right Home Layout: One level     Bathroom Shower/Tub: Teacher, early years/pre: Standard     Home Equipment: Environmental consultant - 4 wheels          Prior Functioning/Environment Level of Independence: Independent with assistive device(s)        Comments: Was using rollator for ambulation         OT Problem List: Decreased strength;Decreased activity tolerance;Decreased safety awareness      OT Treatment/Interventions: Self-care/ADL training;Therapeutic exercise;Neuromuscular education;Energy conservation;Therapeutic activities;Visual/perceptual remediation/compensation    OT Goals(Current goals can be found in the care plan section) Acute Rehab OT Goals Patient Stated Goal: to go home OT Goal Formulation: With patient Time For Goal Achievement: 05/07/19 Potential to Achieve Goals: Good ADL Goals Pt/caregiver will Perform Home Exercise Program: Increased strength;Both right and left upper extremity;With written HEP provided Additional ADL Goal #1: Pt will state 3 ways to implement energy conservation techniques. Additional ADL Goal #2: Pt will increase to supervisionA for OOB ADL.  OT Frequency: Min 2X/week   Barriers to D/C:            Co-evaluation              AM-PAC OT "6 Clicks" Daily Activity     Outcome Measure Help from another person eating meals?: None Help from another person taking care of personal grooming?: None Help from another person toileting, which includes using toliet, bedpan, or urinal?: None Help from another person bathing (including  washing, rinsing, drying)?: A Little Help from another person to put on and taking off regular upper body clothing?: None Help from another person to put on and taking off regular lower body clothing?: None 6 Click Score: 23   End of Session Equipment Utilized During Treatment: Gait belt;Rolling walker;Oxygen Nurse Communication: Mobility status  Activity Tolerance: Patient tolerated treatment well Patient left: in chair;with call bell/phone within reach  OT Visit Diagnosis: Unsteadiness on feet (R26.81);Muscle weakness (generalized) (M62.81)                Time: 8280-0349 OT Time Calculation (min): 25 min Charges:  OT General Charges $OT Visit: 1 Visit OT Evaluation $OT Eval Moderate Complexity: 1 Mod OT Treatments $Therapeutic Activity: 8-22 mins  Jefferey Pica, OTR/L Acute Rehabilitation Services Pager: [phone] Office: [phone]   Demitria Hay C 04/23/2019, 1:29 PM

## 2019-04-24 ENCOUNTER — Telehealth: Payer: Self-pay | Admitting: Medical

## 2019-04-24 DIAGNOSIS — Z992 Dependence on renal dialysis: Secondary | ICD-10-CM

## 2019-04-24 DIAGNOSIS — N186 End stage renal disease: Secondary | ICD-10-CM

## 2019-04-24 DIAGNOSIS — E876 Hypokalemia: Secondary | ICD-10-CM

## 2019-04-24 LAB — CBC
HCT: 31.7 % — ABNORMAL LOW (ref 39.0–52.0)
Hemoglobin: 9.8 g/dL — ABNORMAL LOW (ref 13.0–17.0)
MCH: 31 pg (ref 26.0–34.0)
MCHC: 30.9 g/dL (ref 30.0–36.0)
MCV: 100.3 fL — ABNORMAL HIGH (ref 80.0–100.0)
Platelets: 397 10*3/uL (ref 150–400)
RBC: 3.16 MIL/uL — ABNORMAL LOW (ref 4.22–5.81)
RDW: 13.7 % (ref 11.5–15.5)
WBC: 8.6 10*3/uL (ref 4.0–10.5)
nRBC: 0 % (ref 0.0–0.2)

## 2019-04-24 LAB — BASIC METABOLIC PANEL
Anion gap: 13 (ref 5–15)
BUN: 48 mg/dL — ABNORMAL HIGH (ref 8–23)
CO2: 28 mmol/L (ref 22–32)
Calcium: 9 mg/dL (ref 8.9–10.3)
Chloride: 101 mmol/L (ref 98–111)
Creatinine, Ser: 3.54 mg/dL — ABNORMAL HIGH (ref 0.61–1.24)
GFR calc Af Amer: 17 mL/min — ABNORMAL LOW (ref >60)
GFR calc non Af Amer: 15 mL/min — ABNORMAL LOW (ref >60)
Glucose, Bld: 99 mg/dL (ref 70–99)
Potassium: 3.2 mmol/L — ABNORMAL LOW (ref 3.5–5.1)
Sodium: 142 mmol/L (ref 135–145)

## 2019-04-24 LAB — GLUCOSE, CAPILLARY
Glucose-Capillary: 127 mg/dL — ABNORMAL HIGH (ref 70–99)
Glucose-Capillary: 99 mg/dL (ref 70–99)
Glucose-Capillary: 137 mg/dL — ABNORMAL HIGH (ref 70–99)
Glucose-Capillary: 107 mg/dL — ABNORMAL HIGH (ref 70–99)
Glucose-Capillary: 110 mg/dL — ABNORMAL HIGH (ref 70–99)

## 2019-04-24 MED ORDER — FUROSEMIDE 10 MG/ML IJ SOLN
80.0000 mg | Freq: Once | INTRAMUSCULAR | Status: AC
  Administered 2019-04-24: 80 mg via INTRAVENOUS
  Filled 2019-04-24: qty 8

## 2019-04-24 MED ORDER — FUROSEMIDE 80 MG PO TABS
80.0000 mg | ORAL_TABLET | Freq: Two times a day (BID) | ORAL | Status: DC
  Administered 2019-04-24 – 2019-04-25 (×2): 80 mg via ORAL
  Filled 2019-04-24 (×2): qty 1

## 2019-04-24 MED ORDER — POTASSIUM CHLORIDE CRYS ER 20 MEQ PO TBCR
30.0000 meq | EXTENDED_RELEASE_TABLET | Freq: Once | ORAL | Status: AC
  Administered 2019-04-24: 30 meq via ORAL
  Filled 2019-04-24: qty 1

## 2019-04-24 NOTE — Telephone Encounter (Signed)
New Message  Pt's wife is calling and says that husband is currently in the hospital and that he has already had an ECHOcardiogram done and wants to know if he needs the one he's scheduled for on 05/01/19.

## 2019-04-24 NOTE — Telephone Encounter (Signed)
Was called from schedulers- asking about cancelling already scheduled ECHO, advised to go ahead and do that since patient was in hospital and had an ECHO completed there. Would notify PA of this change for any other instructions if needed

## 2019-04-24 NOTE — Progress Notes (Signed)
  Mobility Specialist Criteria Algorithm Info.  SATURATION QUALIFICATIONS: (This note is used to comply with regulatory documentation for home oxygen)  Patient Saturations on Room Air at Rest = 96%  Patient Saturations on Room Air while Ambulating = 93%  Patient Saturations on n/a Liters of oxygen while Ambulating = n/a%  Please briefly explain why patient needs home oxygen:  Mobility:  HOB elevated:Self regulated Activity: Ambulated in hall (In chair before and after ambulation) Range of motion: Active;All extremities Level of assistance: Minimal assist, patient does 75% or more Assistive device: Front wheel walker Minutes stood: 8 minutes Minutes ambulated: 8 minutes Distance ambulated (ft): 500 ft Mobility response: Tolerated well Bed Position: Chair    04/24/2019 2:10 PM

## 2019-04-24 NOTE — Progress Notes (Signed)
Nephrology Progress Note:  Patient ID: [name], male   DOB: 06-May-1933, 86 y.o.   MRN: [id number]   S:  Had 4.1 liters UOP over 2/24 with metolazone 5 and lasix 80 mg IV x 2 yesterday.  He states ambulated a couple of times yesterday.  Feels well today.  Review of systems:  Shortness of breath much better Good urine output  No n/v  No chest pain    O:BP (!) 117/53 (BP Location: Left Arm)   Pulse (!) 59   Temp 97.9 F (36.6 C) (Oral)   Resp 20   Ht 5\' 9"  (1.753 m)   Wt 78.1 kg   SpO2 100%   BMI 25.43 kg/m   Intake/Output Summary (Last 24 hours) at 04/24/2019 0846 Last data filed at 04/24/2019 0600 Gross per 24 hour  Intake 820 ml  Output 4100 ml  Net -3280 ml   Intake/Output: I/O last 3 completed shifts: In: 1400 [P.O.:1200; IV Piggyback:200] Out: 0093 [Urine:6150]  Intake/Output this shift:  No intake/output data recorded. Weight change: -1.179 kg   Physical Exam:   Gen: adult male in bed in NAD CVS: S1S2 no rub Resp: cta on room air; unlabored at rest Abd: soft, NT/ND; PD catheter insertion site without bleeding. Dressing dry. Ext: 2+ edema Neuro - alert and oriented x 3; follows commands and provides hx Psych normal mood and affect  Recent Labs  Lab 04/19/19 0332 04/19/19 1955 04/20/19 0428 04/21/19 0350 04/22/19 0542 04/23/19 0503 04/24/19 0440  NA 147* 144 146* 144 144 142 142  K 2.9* 3.3* 3.4* 3.6 3.7 3.7 3.2*  CL 98 98 102 102 102 102 101  CO2 34* 34* 33* 30 30 28 28   GLUCOSE 227* 231* 203* 219* 239* 96 99  BUN 48* 44* 43* 44* 45* 46* 48*  CREATININE 4.01* 3.65* 3.67* 3.55* 3.38* 3.35* 3.54*  ALBUMIN  --   --  2.6* 2.3* 2.3*  --   --   CALCIUM 8.3* 8.9 8.7* 8.6* 8.9 9.0 9.0  PHOS  --   --  2.9 3.1 3.5  --   --    Liver Function Tests: Recent Labs  Lab 04/20/19 0428 04/21/19 0350 04/22/19 0542  ALBUMIN 2.6* 2.3* 2.3*   CBC: Recent Labs  Lab 04/18/19 1652 04/19/19 0332 04/20/19 0428 04/20/19 0428 04/21/19 0350 04/21/19 0350  04/22/19 0542 04/23/19 0503 04/24/19 0440  WBC 12.4*   < > 14.0*   < > 12.0*   < > 11.0* 10.3 8.6  NEUTROABS 9.9*  --   --   --   --   --   --   --   --   HGB 11.4*   < > 10.9*   < > 9.0*   < > 9.5* 10.6* 9.8*  HCT 36.7*   < > 35.0*   < > 28.3*   < > 30.1* 33.6* 31.7*  MCV 102.5*   < > 101.7*  --  101.1*  --  101.3* 100.3* 100.3*  PLT 336   < > 323   < > 311   < > 329 370 397   < > = values in this interval not displayed.   Cardiac Enzymes: No results for input(s): CKTOTAL, CKMB, CKMBINDEX, TROPONINI in the last 168 hours. CBG: Recent Labs  Lab 04/23/19 0618 04/23/19 1118 04/23/19 1629 04/23/19 2129 04/24/19 0538  GLUCAP 90 99 110* 127* 99    Studies/Results: No results found. Marland Kitchen amLODipine  10 mg Oral QHS  .  aspirin EC  81 mg Oral Daily  . atorvastatin  40 mg Oral QHS  . carvedilol  3.125 mg Oral BID WC  . folic acid  1 mg Oral Daily  . furosemide  80 mg Oral BID  . hydrALAZINE  50 mg Oral TID  . insulin aspart  0-5 Units Subcutaneous QHS  . insulin aspart  0-6 Units Subcutaneous TID WC  . insulin aspart  3 Units Subcutaneous TID WC  . insulin detemir  8 Units Subcutaneous BID  . mouth rinse  15 mL Mouth Rinse BID  . multivitamin with minerals  1 tablet Oral Daily    BMET    Component Value Date/Time   NA 142 04/24/2019 0440   NA 143 10/12/2017 1126   K 3.2 (L) 04/24/2019 0440   CL 101 04/24/2019 0440   CO2 28 04/24/2019 0440   GLUCOSE 99 04/24/2019 0440   BUN 48 (H) 04/24/2019 0440   BUN 38 (H) 10/12/2017 1126   CREATININE 3.54 (H) 04/24/2019 0440   CALCIUM 9.0 04/24/2019 0440   GFRNONAA 15 (L) 04/24/2019 0440   GFRAA 17 (L) 04/24/2019 0440   CBC    Component Value Date/Time   WBC 8.6 04/24/2019 0440   RBC 3.16 (L) 04/24/2019 0440   HGB 9.8 (L) 04/24/2019 0440   HGB 13.2 06/19/2017 0940   HCT 31.7 (L) 04/24/2019 0440   HCT 38.1 06/19/2017 0940   PLT 397 04/24/2019 0440   PLT 201 06/19/2017 0940   MCV 100.3 (H) 04/24/2019 0440   MCV 93 06/19/2017  0940   MCH 31.0 04/24/2019 0440   MCHC 30.9 04/24/2019 0440   RDW 13.7 04/24/2019 0440   RDW 14.1 06/19/2017 0940   LYMPHSABS 1.5 04/18/2019 1652   MONOABS 0.8 04/18/2019 1652   EOSABS 0.1 04/18/2019 1652   BASOSABS 0.0 04/18/2019 1652    Assessment/Plan: 1.  AKI/CKD stage V- in setting of decompensated CHF and diuresis.  His Cr is slightly improved from yesterday but still above his baseline.  He is without uremic symptoms and is planning to start PD training on 05/05/19. 1. Cr stable and without uremic symptoms  2. Acute diastolic CHF- improving with diuretics.  Cardiology has transitioned to lasix 80 mg PO BID.  Hasn't had AM lasix PO yet so I changed to IV for this AM as still has quite a bit of fluid though improving. 3. Acute hypoxic respiratory failure- thought to be due to pulmonary edema.  On lasix. abx per primary 4. Bleeding from PD catheter exit site- resolved; he is s/p DDAVP  5. CAD s/p CABG- stable 6. Hypokalemia- improved/stable.  Replete today x 1 and can start potassium 10 meq daily at home.   7. Anemia of CKD- initiated ESA. (received 40 mcg on 2/22) 8. HTN- acceptable control  9. DM per primary.  dispo - cardiology requested ambulatory oxygen monitoring to direct dispo - may go today.  I have requested follow-up with [facility] for one week post-discharge.  Asked pt to contact home training after discharge to set up next appt with them as well.    Claudia Desanctis, MD 04/24/2019 9:02 AM

## 2019-04-24 NOTE — Progress Notes (Signed)
Occupational Therapy Treatment Patient Details Name: [name] MRN: [id number] DOB: [birth year] Today's Date: 04/24/2019    History of present illness Pt is an 86 y/o male admitted secondary to worsening shortness of breath; thought to be secondary to [facility]. Pt with new PD catheter and plans to start in March. PMH includes CAD s/p CABG, HTN, CKD, DM, and prostate cancer.    OT comments  Pt making good progress toward OT goals. Focused session on BADL activity tolerance. Pt able to complete beyond level of functional mobility at min guard level with RW in hallway. Needed cues to use RW to improve balance and safety with functional mobility. Pt with SpO2 stable on RA. Educated pt on ECS strategies such as pacing techniques, pursed lip breathing, and sitting when necessary to have more energy for BADL. Pt very pleasant and agreeable to education. D/c recs remain appropriate, will continue to follow.   Follow Up Recommendations  Home health OT    Equipment Recommendations  None recommended by OT    Recommendations for Other Services      Precautions / Restrictions Precautions Precautions: Fall Restrictions Weight Bearing Restrictions: No       Mobility Bed Mobility               General bed mobility comments: up in chair  Transfers Overall transfer level: Needs assistance Equipment used: Rolling walker (2 wheeled) Transfers: Sit to/from Stand Sit to Stand: Supervision         General transfer comment: supervision for safety to stand from recliner    Balance Overall balance assessment: Needs assistance Sitting-balance support: No upper extremity supported;Feet supported Sitting balance-Leahy Scale: Good     Standing balance support: Bilateral upper extremity supported;During functional activity Standing balance-Leahy Scale: Poor Standing balance comment: Reliant on BUE support                            ADL either performed or assessed with  clinical judgement   ADL Overall ADL's : Needs assistance/impaired                     Lower Body Dressing: Min guard;Cueing for safety;Sitting/lateral leans;Sit to/from stand   Toilet Transfer: Min guard;Cueing for safety;RW           Functional mobility during ADLs: Min guard;Rolling walker General ADL Comments: pt improving activity tolerance and cardiopulmonary status, not needin O2 for functional mobility this date     Vision Baseline Vision/History: Wears glasses Wears Glasses: Reading only Patient Visual Report: No change from baseline Vision Assessment?: No apparent visual deficits   Perception     Praxis      Cognition Arousal/Alertness: Awake/alert Behavior During Therapy: WFL for tasks assessed/performed Overall Cognitive Status: Within Functional Limits for tasks assessed                                          Exercises     Shoulder Instructions       General Comments      Pertinent Vitals/ Pain       Pain Assessment: No/denies pain  Home Living  Prior Functioning/Environment              Frequency  Min 2X/week        Progress Toward Goals  OT Goals(current goals can now be found in the care plan section)  Progress towards OT goals: Progressing toward goals  Acute Rehab OT Goals Patient Stated Goal: to go home OT Goal Formulation: With patient Time For Goal Achievement: 05/07/19 Potential to Achieve Goals: Good  Plan Discharge plan remains appropriate    Co-evaluation                 AM-PAC OT "6 Clicks" Daily Activity     Outcome Measure   Help from another person eating meals?: None Help from another person taking care of personal grooming?: None Help from another person toileting, which includes using toliet, bedpan, or urinal?: None Help from another person bathing (including washing, rinsing, drying)?: A Little Help from  another person to put on and taking off regular upper body clothing?: None Help from another person to put on and taking off regular lower body clothing?: None 6 Click Score: 23    End of Session Equipment Utilized During Treatment: Gait belt;Rolling walker;Oxygen  OT Visit Diagnosis: Unsteadiness on feet (R26.81);Muscle weakness (generalized) (M62.81)   Activity Tolerance Patient tolerated treatment well   Patient Left in chair;with call bell/phone within reach   Nurse Communication Mobility status        Time: 6967-8938 OT Time Calculation (min): 20 min  Charges: OT General Charges $OT Visit: 1 Visit OT Treatments $Self Care/Home Management : 8-22 mins  Zenovia Jarred, MSOT, OTR/L [facility] Office Number: [phone] Pager: [phone]  Zenovia Jarred 04/24/2019, 4:30 PM

## 2019-04-24 NOTE — TOC Transition Note (Signed)
Transition of Care [facility]) - CM/SW Discharge Note   Patient Details  Name: FEDERICK LEVENE MRN: [id number] Date of Birth: 1934/02/08  Transition of Care [facility]) CM/SW Contact:  Zenon Mayo, RN Phone Number: 04/24/2019, 12:50 PM   Clinical Narrative:    NCM spoke with patient , offered choice, he states he would like for NCM to call his wife.  NCM contacted wife, offered choice, she states she does not have a preference  And to try [facility].  NCM made referral to [facility] with [facility], she is able to take referral with soc on Tuesday 3/2.  Patient will also need to call home training with diaylsis to set up his next home training per nephrology, they have already been coming out to his house.     Final next level of care: [place] Barriers to Discharge: Continued Medical Work up   Patient Goals and CMS Choice Patient states their goals for this hospitalization and ongoing recovery are:: get better CMS Medicare.gov Compare Post Acute Care list provided to:: Patient Represenative (must comment)(wife)    Discharge Placement                       Discharge Plan and Services                DME Arranged: (NA)         HH Arranged: PT HH Agency: [place] Date [place]: 04/24/19 Time HH Agency Contacted: 1250 Representative spoke with at [place]: [place] Determinants of Health ([place]) Interventions     Readmission Risk Interventions No flowsheet data found.

## 2019-04-24 NOTE — Plan of Care (Signed)
  Problem: Education: Goal: Knowledge of General Education information will improve Description Including pain rating scale, medication(s)/side effects and non-pharmacologic comfort measures Outcome: Progressing   

## 2019-04-24 NOTE — Progress Notes (Signed)
Progress Note  Patient Name: [name] Date of Encounter: 04/24/2019  Primary Cardiologist: Pixie Casino, MD   Subjective   Pt feeling well today. Sitting up in chair. No specific complaints today. Remains on IV Lasix    Inpatient Medications    Scheduled Meds: . amLODipine  10 mg Oral QHS  . aspirin EC  81 mg Oral Daily  . atorvastatin  40 mg Oral QHS  . carvedilol  3.125 mg Oral BID WC  . folic acid  1 mg Oral Daily  . furosemide  80 mg Oral BID  . hydrALAZINE  50 mg Oral TID  . insulin aspart  0-5 Units Subcutaneous QHS  . insulin aspart  0-6 Units Subcutaneous TID WC  . insulin aspart  3 Units Subcutaneous TID WC  . insulin detemir  8 Units Subcutaneous BID  . mouth rinse  15 mL Mouth Rinse BID  . multivitamin with minerals  1 tablet Oral Daily   Continuous Infusions: . cefTRIAXone (ROCEPHIN)  IV 1 g (04/23/19 1954)   PRN Meds: acetaminophen, bisacodyl, docusate sodium, hydroxypropyl methylcellulose / hypromellose, nitroGLYCERIN, ondansetron (ZOFRAN) IV, traZODone   Vital Signs    Vitals:   04/23/19 1250 04/23/19 1720 04/24/19 0104 04/24/19 0707  BP: (!) 125/51 (!) 135/52  (!) 117/53  Pulse: (!) 55 60  (!) 59  Resp:    20  Temp: (!) 97.4 F (36.3 C)   97.9 F (36.6 C)  TempSrc: Oral   Oral  SpO2: 100%   100%  Weight:   78.1 kg   Height:        Intake/Output Summary (Last 24 hours) at 04/24/2019 0826 Last data filed at 04/24/2019 0600 Gross per 24 hour  Intake 1060 ml  Output 4100 ml  Net -3040 ml   Filed Weights   04/22/19 0525 04/23/19 0609 04/24/19 0104  Weight: 79.1 kg 79.3 kg 78.1 kg    Physical Exam   General: Elderly, NAD Neck: Negative for carotid bruits. No JVD Lungs: Diminished in bilateral bases. No wheezes, rales, or rhonchi. Breathing is unlabored. Cardiovascular: RRR with S1 S2. +AS murmur Abdomen: Soft, non-tender, non-distended. No obvious abdominal masses. Extremities: 3+ BLE edema. DP pulses 2+ bilaterally Neuro:  Alert and oriented. No focal deficits. No facial asymmetry. MAE spontaneously. Psych: Responds to questions appropriately with normal affect.    Labs    Chemistry Recent Labs  Lab 04/20/19 0428 04/20/19 0428 04/21/19 0350 04/21/19 0350 04/22/19 0542 04/23/19 0503 04/24/19 0440  NA 146*   < > 144   < > 144 142 142  K 3.4*   < > 3.6   < > 3.7 3.7 3.2*  CL 102   < > 102   < > 102 102 101  CO2 33*   < > 30   < > 30 28 28   GLUCOSE 203*   < > 219*   < > 239* 96 99  BUN 43*   < > 44*   < > 45* 46* 48*  CREATININE 3.67*   < > 3.55*   < > 3.38* 3.35* 3.54*  CALCIUM 8.7*   < > 8.6*   < > 8.9 9.0 9.0  ALBUMIN 2.6*  --  2.3*  --  2.3*  --   --   GFRNONAA 14*   < > 15*   < > 16* 16* 15*  GFRAA 17*   < > 17*   < > 18* 19* 17*  ANIONGAP  11   < > 12   < > 12 12 13    < > = values in this interval not displayed.     Hematology Recent Labs  Lab 04/22/19 0542 04/23/19 0503 04/24/19 0440  WBC 11.0* 10.3 8.6  RBC 2.97* 3.35* 3.16*  HGB 9.5* 10.6* 9.8*  HCT 30.1* 33.6* 31.7*  MCV 101.3* 100.3* 100.3*  MCH 32.0 31.6 31.0  MCHC 31.6 31.5 30.9  RDW 14.0 13.8 13.7  PLT 329 370 397    Cardiac EnzymesNo results for input(s): TROPONINI in the last 168 hours. No results for input(s): TROPIPOC in the last 168 hours.   BNP Recent Labs  Lab 04/18/19 1652  BNP 1,406.6*     DDimer No results for input(s): DDIMER in the last 168 hours.   Radiology    No results found.  Telemetry    04/24/19 NSR - Personally Reviewed  ECG    No new tracing as of 04/24/19- Personally Reviewed  Cardiac Studies   Echocardiogram 04/19/2019  1. Since last echo, estimated RV pressure has increased. Aortic stenosis  is unchanged.  2. Left ventricular ejection fraction, by estimation, is 60 to 65%. The  left ventricle has normal function. The left ventricle has no regional  wall motion abnormalities. There is mild left ventricular hypertrophy.  Left ventricular diastolic parameters  are consistent  with Grade II diastolic dysfunction (pseudonormalization).  Elevated left atrial pressure.  3. Right ventricular systolic function is normal. The right ventricular  size is mildly enlarged. There is severely elevated pulmonary artery  systolic pressure. The estimated right ventricular systolic pressure is  86.7 mmHg.  4. Left atrial size was mild to moderately dilated.  5. Right atrial size was moderately dilated.  6. The mitral valve is normal in structure and function. Mild mitral  valve regurgitation. No evidence of mitral stenosis.  7. Tricuspid valve regurgitation is moderate to severe.  8. The aortic valve is tricuspid. Aortic valve regurgitation is not  visualized. Mild to moderate aortic valve stenosis. Aortic valve area, by  VTI measures 1.18 cm. Aortic valve mean gradient measures 17.0 mmHg.  Aortic valve Vmax measures 2.84 m/s.  9. The inferior vena cava is dilated in size with >50% respiratory  variability, suggesting right atrial pressure of 8 mmHg.    Right/left heart catheterization 05/2017:  Prox RCA to Mid RCA lesion is 30% stenosed.  Mid RCA lesion is 40% stenosed.  Dist RCA lesion is 30% stenosed.  Ost LAD to Prox LAD lesion is 100% stenosed.  LIMA graft was visualized by angiography and is normal in caliber.  Ost Cx to Prox Cx lesion is 90% stenosed.  Hemodynamic findings consistent with mild pulmonary hypertension.  LV end diastolic pressure is normal.  1. Severe double vessel CAD s/p 3V CABG with 3/3 patent bypass grafts  2. The LAD has 100% proximal occlusion. The entire LAD fills from the patent LIMA graft. The Diagonal branch fills from the vein graft 3. The Circumflex has 90% proximal stenosis. The vein graft fills the large obtuse marginal graft.  4. The RCA is a large dominant vessel with mild diffuse calcific plaque in the proximal, mid and distal vessel but no obstructive lesions.  5. The LIMA to the LAD is patent 6 The sequential  saphenous vein graft to the Diagonal and OM is patent with mild filling defects seen in the proximal, mid and distal segments of the body of the graft. These lesions do not appear to be flow limiting.  7. Mild aortic stenosis. Peak to peak gradient 4 mmHg. The valve was easily crossed.  8. Right and left heart filling pressures outline above.   Recommendations: Continue medical management of CAD. Continue aggressive blood pressure control and diuresis for diastolic CHF. Will hydrate for 6 hours post cath and plan discharge home later today.  Patient Profile     86 y.o. male chronic kidney disease stage IV, type 2 diabetes, prostate cancer, hypertension, coronary disease status post CABG 1987 and chronic diastolic heart failure admitted with shortness of breath, mildly elevated troponin335. Covid negative.  Assessment & Plan    1. Acute on chronic diastolic heart failure: -Felt to be secondary to pulmonary edema given CXR imaging however also being treated for PNA with ceftriaxone given leukocytosis and fever during hospital course. -Echocardiogram performed 2/20 which showed normal LV systolic function with EF 60-65%, no R WMA, mild LVH, grade 2 diastolic dysfunction.  There was increased estimated RV pressure. -Currently being diuresed with IV Lasix 80 mg twice daily  -Weight, 172.2lb>>>167lb on admission  -I&O, net negative 8.1L -Plan was initially for hospital discharge to 2/24 however had desaturations with ambulation off supplemental oxygen therefore was kept an additional night -Today appears to be doing well>>on RA currently>>will need further assessment with ambulation  -Remains on IV Lasix 80mg  BID>>2-3+ BLE >>given metolazone yesterday  -Nephrology wanted an additional does of IV Lasix today then transition to [place] nephrology assistance   2. CAD: -S/p CABG 1987 right/left heart cath 05/2017 showed 3/3 patent bypass grafts. -Denies anginal symptoms  -Continue ASA,  statin   3. Hypertension: -Stable, 117/53>135/52>125/51 -Patient with a history of orthostatic complaints therefore favoring permissive hypertension to minimize symptoms  -Continue amlodipine, carvedilol, hydralazine and Lasix   4. AKI/CKD stage V: -Followed by nephrology -Plans for initiation of peritoneal dialysis, PD training to start 05/05/2019 -Serum creatinine 4.36>3.55>3.54 -Potassium was initially low at 2.6>>3.2 today>>supplemented by nephrology   5. Hyperlipidemia: -LDL, 56 >>> at goal of less than 70  -Continue statin   6. Diabetes type 2: -Hemoglobin A1c, 7.3  -On SSI for glucose control while inpatient status -Restart home regimen at discharge  7. Anemia of CKD: -Hemoglobin has drifted down from 11.4 to 9.0. -Followed by nephrology with plan for EPA  8. Aortic stenosis: -Noted to be mild on right/left heart cath in 05/2017.   -Echo done on 2/20 showed no change in aortic stenosis, mild-mod -No syncopal episodes  9. Deconditioning: -PT recommends home health PT, will arrange after d/c. -Attempting to avoid supplemental home oxygen therapy  10.  Hypokalemia: -K+, 3.2 -Supplemented by nephrology supplement with 71meq KDUR -Nephrology recommends 62meq maintenance at home   Signed, Kathyrn Drown NP-C [place] Pager: [phone] 04/24/2019, 8:26 AM     For questions or updates, please contact   Please consult [email or web address] for contact info under Cardiology/STEMI.

## 2019-04-25 DIAGNOSIS — I5033 Acute on chronic diastolic (congestive) heart failure: Secondary | ICD-10-CM

## 2019-04-25 LAB — BASIC METABOLIC PANEL
Anion gap: 13 (ref 5–15)
BUN: 49 mg/dL — ABNORMAL HIGH (ref 8–23)
CO2: 27 mmol/L (ref 22–32)
Calcium: 9.1 mg/dL (ref 8.9–10.3)
Chloride: 101 mmol/L (ref 98–111)
Creatinine, Ser: 3.89 mg/dL — ABNORMAL HIGH (ref 0.61–1.24)
GFR calc Af Amer: 16 mL/min — ABNORMAL LOW (ref >60)
GFR calc non Af Amer: 13 mL/min — ABNORMAL LOW (ref >60)
Glucose, Bld: 130 mg/dL — ABNORMAL HIGH (ref 70–99)
Potassium: 3.5 mmol/L (ref 3.5–5.1)
Sodium: 141 mmol/L (ref 135–145)

## 2019-04-25 LAB — CBC
HCT: 33.1 % — ABNORMAL LOW (ref 39.0–52.0)
Hemoglobin: 10.4 g/dL — ABNORMAL LOW (ref 13.0–17.0)
MCH: 31 pg (ref 26.0–34.0)
MCHC: 31.4 g/dL (ref 30.0–36.0)
MCV: 98.8 fL (ref 80.0–100.0)
Platelets: 426 10*3/uL — ABNORMAL HIGH (ref 150–400)
RBC: 3.35 MIL/uL — ABNORMAL LOW (ref 4.22–5.81)
RDW: 13.5 % (ref 11.5–15.5)
WBC: 8.3 10*3/uL (ref 4.0–10.5)
nRBC: 0 % (ref 0.0–0.2)

## 2019-04-25 LAB — GLUCOSE, CAPILLARY
Glucose-Capillary: 117 mg/dL — ABNORMAL HIGH (ref 70–99)
Glucose-Capillary: 132 mg/dL — ABNORMAL HIGH (ref 70–99)

## 2019-04-25 MED ORDER — HYDRALAZINE HCL 50 MG PO TABS: 50.0000 mg | ORAL_TABLET | Freq: Three times a day (TID) | ORAL | 3 refills | Status: DC

## 2019-04-25 MED ORDER — POTASSIUM CHLORIDE CRYS ER 10 MEQ PO TBCR
10.0000 meq | EXTENDED_RELEASE_TABLET | Freq: Every day | ORAL | Status: DC
  Administered 2019-04-25: 10 meq via ORAL
  Filled 2019-04-25: qty 1

## 2019-04-25 MED ORDER — POTASSIUM CHLORIDE CRYS ER 10 MEQ PO TBCR: 10.0000 meq | EXTENDED_RELEASE_TABLET | Freq: Every day | ORAL | 2 refills | Status: DC

## 2019-04-25 MED ORDER — FUROSEMIDE 80 MG PO TABS: 80.0000 mg | ORAL_TABLET | Freq: Two times a day (BID) | ORAL | 3 refills | Status: DC

## 2019-04-25 MED ORDER — NITROGLYCERIN 0.4 MG SL SUBL: 0.4000 mg | SUBLINGUAL_TABLET | SUBLINGUAL | 0 refills | Status: DC | PRN

## 2019-04-25 NOTE — Progress Notes (Signed)
   04/25/19 1006  Peritoneal Catheter Right lower abdomen  No Placement Date or Time found.   Catheter Location: Right lower abdomen  Catheter status Clamped  Dressing Gauze/Drain sponge  Dressing Status Clean;Dry;Intact  Dressing Intervention Other (Comment) (assessed)   Contacted nephrology, patient receives dressing changes weekly, no orders for gentamycin or dressing changes for nursing at this time

## 2019-04-25 NOTE — Progress Notes (Signed)
Patient discharged to home. AVS reviewed and all questions answered. Patient confirmed prescriptions were sent to the correct pharmacy and he had all of his belongings. Telemetry was discontinued and box was returned. IV's removed. PD cath secured and dressing was changed. Patient was assisted to exit and family provided transportation.

## 2019-04-25 NOTE — Progress Notes (Signed)
Physical Therapy Treatment Patient Details Name: [name] MRN: [id number] DOB: 09-04-1933 Today's Date: 04/25/2019    History of Present Illness Pt is an 86 y/o male admitted secondary to worsening shortness of breath; thought to be secondary to [facility]. Pt with new PD catheter and plans to start in March. PMH includes CAD s/p CABG, HTN, CKD, DM, and prostate cancer.     PT Comments    Pt is progressing well towards goals. He progressed OOB with supervision for safety and negotiated steps with min guard.  Pt reports he continues to feel weaker than is his normal and it looking forward to getting back to his regular walks in his home. Patient would benefit from continued skilled PT to maximize funxtional independence and safety with mobility. Expected to d/c today.     Follow Up Recommendations  Home health PT;Supervision for mobility/OOB     Equipment Recommendations  None recommended by PT    Recommendations for Other Services       Precautions / Restrictions Precautions Precautions: Fall Restrictions Weight Bearing Restrictions: No    Mobility  Bed Mobility               General bed mobility comments: in chair on arrival  Transfers Overall transfer level: Needs assistance Equipment used: Rolling walker (2 wheeled) Transfers: Sit to/from Stand Sit to Stand: Supervision         General transfer comment: supervision for safety to stand from recliner. Heavy use of arm rests.  Ambulation/Gait Ambulation/Gait assistance: Supervision Gait Distance (Feet): 250 Feet Assistive device: Rolling walker (2 wheeled) Gait Pattern/deviations: Step-through pattern;Decreased stride length Gait velocity: Decreased   General Gait Details: pt overall steady with RW, no LOB or need for physical assistance   Stairs Stairs: Yes Stairs assistance: Min guard Stair Management: One rail Right Number of Stairs: 4 General stair comments: Pt able to negotiate 4 steps with min  guard for safety. At one time he reached out to rail on L for additional support.    Wheelchair Mobility    Modified Rankin (Stroke Patients Only)       Balance Overall balance assessment: Needs assistance Sitting-balance support: No upper extremity supported;Feet supported Sitting balance-Leahy Scale: Good     Standing balance support: Bilateral upper extremity supported;During functional activity Standing balance-Leahy Scale: Poor Standing balance comment: Reliant on BUE support                             Cognition Arousal/Alertness: Awake/alert Behavior During Therapy: WFL for tasks assessed/performed Overall Cognitive Status: Within Functional Limits for tasks assessed                                        Exercises      General Comments        Pertinent Vitals/Pain Pain Assessment: No/denies pain    Home Living                      Prior Function            PT Goals (current goals can now be found in the care plan section) Acute Rehab PT Goals Patient Stated Goal: to go home PT Goal Formulation: With patient Time For Goal Achievement: 05/06/19 Potential to Achieve Goals: Good Progress towards PT goals: Progressing toward goals  Frequency    Min 3X/week      PT Plan Current plan remains appropriate    Co-evaluation              AM-PAC PT "6 Clicks" Mobility   Outcome Measure  Help needed turning from your back to your side while in a flat bed without using bedrails?: None Help needed moving from lying on your back to sitting on the side of a flat bed without using bedrails?: A Little Help needed moving to and from a bed to a chair (including a wheelchair)?: A Little Help needed standing up from a chair using your arms (e.g., wheelchair or bedside chair)?: None Help needed to walk in hospital room?: A Little Help needed climbing 3-5 steps with a railing? : A Little 6 Click Score: 20    End of  Session Equipment Utilized During Treatment: Gait belt Activity Tolerance: Patient tolerated treatment well Patient left: in chair;with call bell/phone within reach Nurse Communication: Mobility status PT Visit Diagnosis: Other abnormalities of gait and mobility (R26.89);Muscle weakness (generalized) (M62.81)     Time: 7494-4967 PT Time Calculation (min) (ACUTE ONLY): 14 min  Charges:  $Gait Training: 8-22 mins                     Benjiman Core, [place] Pager [phone] Acute Rehab   Allena Katz 04/25/2019, 10:30 AM

## 2019-04-25 NOTE — Progress Notes (Signed)
Progress Note  Patient Name: [name] Date of Encounter: 04/25/2019  Primary Cardiologist: Pixie Casino, MD  Subjective   Feeling well today. No specific complaints.   Inpatient Medications    Scheduled Meds: . amLODipine  10 mg Oral QHS  . aspirin EC  81 mg Oral Daily  . atorvastatin  40 mg Oral QHS  . carvedilol  3.125 mg Oral BID WC  . folic acid  1 mg Oral Daily  . furosemide  80 mg Oral BID  . hydrALAZINE  50 mg Oral TID  . insulin aspart  0-5 Units Subcutaneous QHS  . insulin aspart  0-6 Units Subcutaneous TID WC  . insulin aspart  3 Units Subcutaneous TID WC  . insulin detemir  8 Units Subcutaneous BID  . mouth rinse  15 mL Mouth Rinse BID  . multivitamin with minerals  1 tablet Oral Daily   Continuous Infusions: . cefTRIAXone (ROCEPHIN)  IV 1 g (04/24/19 2005)   PRN Meds: acetaminophen, bisacodyl, docusate sodium, hydroxypropyl methylcellulose / hypromellose, nitroGLYCERIN, ondansetron (ZOFRAN) IV, traZODone   Vital Signs    Vitals:   04/24/19 1151 04/24/19 1726 04/24/19 2020 04/25/19 0325  BP: 138/60 (!) 129/58 (!) 138/59 (!) 127/58  Pulse: 60 62 (!) 57 66  Resp: 18  20 20   Temp: 98.3 F (36.8 C)  98.5 F (36.9 C) 98.4 F (36.9 C)  TempSrc: Oral  Oral Oral  SpO2: 100%   100%  Weight:    76.8 kg  Height:        Intake/Output Summary (Last 24 hours) at 04/25/2019 0714 Last data filed at 04/25/2019 3267 Gross per 24 hour  Intake 800 ml  Output 2175 ml  Net -1375 ml   Filed Weights   04/23/19 0609 04/24/19 0104 04/25/19 0325  Weight: 79.3 kg 78.1 kg 76.8 kg    Physical Exam   General: Well developed, well nourished, NAD Skin: Warm, dry, intact  Neck: Negative for carotid bruits. No JVD Lungs:Clear to ausculation bilaterally. No wheezes, rales, or rhonchi. Breathing is unlabored. Cardiovascular: RRR with S1 S2. No murmurs, rubs, gallops, or LV heave appreciated. Abdomen: Soft, non-tender, non-distended with normoactive bowel  sounds. No hepatomegaly, No rebound/guarding. No obvious abdominal masses. MSK: Strength and tone appear normal for age. 5/5 in all extremities Extremities: No edema. No clubbing or cyanosis. DP/PT pulses 2+ bilaterally Neuro: Alert and oriented. No focal deficits. No facial asymmetry. MAE spontaneously. Psych: Responds to questions appropriately with normal affect.    Labs    Chemistry Recent Labs  Lab 04/20/19 1245 04/20/19 8099 04/21/19 0350 04/21/19 0350 04/22/19 0542 04/22/19 0542 04/23/19 0503 04/24/19 0440 04/25/19 0441  NA 146*   < > 144   < > 144   < > 142 142 141  K 3.4*   < > 3.6   < > 3.7   < > 3.7 3.2* 3.5  CL 102   < > 102   < > 102   < > 102 101 101  CO2 33*   < > 30   < > 30   < > 28 28 27   GLUCOSE 203*   < > 219*   < > 239*   < > 96 99 130*  BUN 43*   < > 44*   < > 45*   < > 46* 48* 49*  CREATININE 3.67*   < > 3.55*   < > 3.38*   < > 3.35* 3.54* 3.89*  CALCIUM 8.7*   < > 8.6*   < > 8.9   < > 9.0 9.0 9.1  ALBUMIN 2.6*  --  2.3*  --  2.3*  --   --   --   --   GFRNONAA 14*   < > 15*   < > 16*   < > 16* 15* 13*  GFRAA 17*   < > 17*   < > 18*   < > 19* 17* 16*  ANIONGAP 11   < > 12   < > 12   < > 12 13 13    < > = values in this interval not displayed.     Hematology Recent Labs  Lab 04/23/19 0503 04/24/19 0440 04/25/19 0441  WBC 10.3 8.6 8.3  RBC 3.35* 3.16* 3.35*  HGB 10.6* 9.8* 10.4*  HCT 33.6* 31.7* 33.1*  MCV 100.3* 100.3* 98.8  MCH 31.6 31.0 31.0  MCHC 31.5 30.9 31.4  RDW 13.8 13.7 13.5  PLT 370 397 426*    Cardiac EnzymesNo results for input(s): TROPONINI in the last 168 hours. No results for input(s): TROPIPOC in the last 168 hours.   BNP Recent Labs  Lab 04/18/19 1652  BNP 1,406.6*     DDimer No results for input(s): DDIMER in the last 168 hours.   Radiology    No results found.  Telemetry    04/25/19 NSR- Personally Reviewed  ECG    No new tracing as of 04/25/19- Personally Reviewed  Cardiac Studies   Echocardiogram  04/19/2019  1. Since last echo, estimated RV pressure has increased. Aortic stenosis  is unchanged.  2. Left ventricular ejection fraction, by estimation, is 60 to 65%. The  left ventricle has normal function. The left ventricle has no regional  wall motion abnormalities. There is mild left ventricular hypertrophy.  Left ventricular diastolic parameters  are consistent with Grade II diastolic dysfunction (pseudonormalization).  Elevated left atrial pressure.  3. Right ventricular systolic function is normal. The right ventricular  size is mildly enlarged. There is severely elevated pulmonary artery  systolic pressure. The estimated right ventricular systolic pressure is  25.9 mmHg.  4. Left atrial size was mild to moderately dilated.  5. Right atrial size was moderately dilated.  6. The mitral valve is normal in structure and function. Mild mitral  valve regurgitation. No evidence of mitral stenosis.  7. Tricuspid valve regurgitation is moderate to severe.  8. The aortic valve is tricuspid. Aortic valve regurgitation is not  visualized. Mild to moderate aortic valve stenosis. Aortic valve area, by  VTI measures 1.18 cm. Aortic valve mean gradient measures 17.0 mmHg.  Aortic valve Vmax measures 2.84 m/s.  9. The inferior vena cava is dilated in size with >50% respiratory  variability, suggesting right atrial pressure of 8 mmHg.    Right/left heart catheterization 05/2017:  Prox RCA to Mid RCA lesion is 30% stenosed.  Mid RCA lesion is 40% stenosed.  Dist RCA lesion is 30% stenosed.  Ost LAD to Prox LAD lesion is 100% stenosed.  LIMA graft was visualized by angiography and is normal in caliber.  Ost Cx to Prox Cx lesion is 90% stenosed.  Hemodynamic findings consistent with mild pulmonary hypertension.  LV end diastolic pressure is normal.  1. Severe double vessel CAD s/p 3V CABG with 3/3 patent bypass grafts  2. The LAD has 100% proximal occlusion. The entire  LAD fills from the patent LIMA graft. The Diagonal branch fills from the vein graft 3. The  Circumflex has 90% proximal stenosis. The vein graft fills the large obtuse marginal graft.  4. The RCA is a large dominant vessel with mild diffuse calcific plaque in the proximal, mid and distal vessel but no obstructive lesions.  5. The LIMA to the LAD is patent 6 The sequential saphenous vein graft to the Diagonal and OM is patent with mild filling defects seen in the proximal, mid and distal segments of the body of the graft. These lesions do not appear to be flow limiting.  7. Mild aortic stenosis. Peak to peak gradient 4 mmHg. The valve was easily crossed.  8. Right and left heart filling pressures outline above.   Recommendations: Continue medical management of CAD. Continue aggressive blood pressure control and diuresis for diastolic CHF. Will hydrate for 6 hours post cath and plan discharge home later today.  Patient Profile     86 y.o. male with a hx of chronic kidney disease stage IV, type 2 diabetes, prostate cancer, hypertension, coronary disease status post CABG 1987 and chronic diastolic heart failure admitted with shortness of breath, mildly elevated troponin335. Covid negative.  Assessment & Plan    1. Acute on chronic diastolic heart failure: -Felt to be secondary to pulmonary edema given CXR imaging however also being treated for PNA with ceftriaxone given leukocytosis and fever during hospital course. -Echocardiogram performed 2/20 which showed normal LV systolic function with EF 60-65%, no R WMA, mild LVH, grade 2 diastolic dysfunction. There was increased estimated RV pressure. -Currently being diuresed with IV Lasix 80 mg twice daily with transition to PO Lasix 80mg  PO BID yesterday afternoon   -Weight, 169lb today, down from 172.2lb yesterday  -I&O, net negative 9.4L -Plan was initially for hospital discharge to 2/24 however had desaturations with ambulation off supplemental  oxygen therefore was kept an additional night -Today appears to be doing well>>on RA currently>>OT ambulated with patient on RA>>stable. Did have recommendations for [place] nephrology assistance   2. CAD: -S/p CABG 1987 right/left heart cath 05/2017 showed 3/3 patent bypass grafts. -Denies anginal symptoms  -Continue ASA, statin   3. Hypertension: -Stable, 127/58>138/59>129/58 -Patient with a history of orthostatic complaints therefore favoring permissive hypertension to minimize symptoms  -Continue amlodipine, carvedilol, hydralazine and Lasix   4. AKI/CKD stage V: -Followed by nephrology -Plans for initiation of peritoneal dialysis, PD training to start 05/05/2019 -Serum creatinine 4.36>3.55>3.54>>3.89 today   5. Hyperlipidemia: -LDL, 56 >>> at goal of less than 70  -Continue statin   6. Diabetes type 2: -Hemoglobin A1c, 7.3  -On SSI for glucose control while inpatient status -Restart home regimen at discharge  7. Anemia of CKD: -Hemoglobin has drifted down from 11.4 to 9.0. -Followed by nephrology with plan for EPA  8. Aortic stenosis: -Noted to be mild on right/left heart cath in 05/2017.  -Echo done on 2/20 showed no change in aortic stenosis, mild-mod -No syncopal episodes  9. Deconditioning: -PT recommends home health PT, will arrange after d/c -For [facility] OT as well  -Ambulated on RA 2/25>>stable   10.  Hypokalemia: -Resolved, K+ today 3.5 -Nephrology recommends 86meq maintenance at home   Signed, Kathyrn Drown NP-C [place] Pager: [phone] 04/25/2019, 7:14 AM     For questions or updates, please contact   Please consult [email or web address] for contact info under Cardiology/STEMI.

## 2019-04-25 NOTE — Progress Notes (Signed)
Nephrology Progress Note:  Patient ID: [name], male   DOB: Dec 10, 1933, 86 y.o.   MRN: [id number]   S:  Feels much better than when he came in  - feels like swelling is going down.  He is for discharge today.  We discussed examples of high potassium foods -- he is going to try and integrate a few into diet.  Review of systems:  Denies shortness of breath  Good urine output  No n/v  No chest pain    O:BP (!) 147/56 (BP Location: Right Arm)   Pulse 64   Temp 98.3 F (36.8 C) (Oral)   Resp 20   Ht 5\' 9"  (1.753 m)   Wt 76.8 kg   SpO2 99%   BMI 25.02 kg/m   Intake/Output Summary (Last 24 hours) at 04/25/2019 1247 Last data filed at 04/25/2019 0900 Gross per 24 hour  Intake 580 ml  Output 2325 ml  Net -1745 ml   Intake/Output: I/O last 3 completed shifts: In: 31 [P.O.:940; IV Piggyback:200] Out: 4675 [Urine:4675]  Intake/Output this shift:  Total I/O In: -  Out: 200 [Urine:200] Weight change: -1.27 kg   Physical Exam:   Gen: adult male in bed in NAD CVS: S1S2 no rub Resp: cta on room air; unlabored at rest Abd: soft, NT/ND; PD catheter insertion site without bleeding. Ext: 2+ edema Neuro - alert and oriented x 3; follows commands and provides hx Psych normal mood and affect  Recent Labs  Lab 04/19/19 1955 04/20/19 0428 04/21/19 0350 04/22/19 0542 04/23/19 0503 04/24/19 0440 04/25/19 0441  NA 144 146* 144 144 142 142 141  K 3.3* 3.4* 3.6 3.7 3.7 3.2* 3.5  CL 98 102 102 102 102 101 101  CO2 34* 33* 30 30 28 28 27   GLUCOSE 231* 203* 219* 239* 96 99 130*  BUN 44* 43* 44* 45* 46* 48* 49*  CREATININE 3.65* 3.67* 3.55* 3.38* 3.35* 3.54* 3.89*  ALBUMIN  --  2.6* 2.3* 2.3*  --   --   --   CALCIUM 8.9 8.7* 8.6* 8.9 9.0 9.0 9.1  PHOS  --  2.9 3.1 3.5  --   --   --    Liver Function Tests: Recent Labs  Lab 04/20/19 0428 04/21/19 0350 04/22/19 0542  ALBUMIN 2.6* 2.3* 2.3*   CBC: Recent Labs  Lab 04/18/19 1652 04/19/19 0332 04/21/19 0350  04/21/19 0350 04/22/19 0542 04/22/19 0542 04/23/19 0503 04/24/19 0440 04/25/19 0441  WBC 12.4*   < > 12.0*   < > 11.0*   < > 10.3 8.6 8.3  NEUTROABS 9.9*  --   --   --   --   --   --   --   --   HGB 11.4*   < > 9.0*   < > 9.5*   < > 10.6* 9.8* 10.4*  HCT 36.7*   < > 28.3*   < > 30.1*   < > 33.6* 31.7* 33.1*  MCV 102.5*   < > 101.1*  --  101.3*  --  100.3* 100.3* 98.8  PLT 336   < > 311   < > 329   < > 370 397 426*   < > = values in this interval not displayed.   Cardiac Enzymes: No results for input(s): CKTOTAL, CKMB, CKMBINDEX, TROPONINI in the last 168 hours. CBG: Recent Labs  Lab 04/24/19 1107 04/24/19 1634 04/24/19 2120 04/25/19 0610 04/25/19 1135  GLUCAP 137* 107* 110* 117* 132*  Studies/Results: No results found. Marland Kitchen amLODipine  10 mg Oral QHS  . aspirin EC  81 mg Oral Daily  . atorvastatin  40 mg Oral QHS  . carvedilol  3.125 mg Oral BID WC  . folic acid  1 mg Oral Daily  . furosemide  80 mg Oral BID  . hydrALAZINE  50 mg Oral TID  . insulin aspart  0-5 Units Subcutaneous QHS  . insulin aspart  0-6 Units Subcutaneous TID WC  . insulin aspart  3 Units Subcutaneous TID WC  . insulin detemir  8 Units Subcutaneous BID  . mouth rinse  15 mL Mouth Rinse BID  . multivitamin with minerals  1 tablet Oral Daily  . potassium chloride  10 mEq Oral Daily    BMET    Component Value Date/Time   NA 141 04/25/2019 0441   NA 143 10/12/2017 1126   K 3.5 04/25/2019 0441   CL 101 04/25/2019 0441   CO2 27 04/25/2019 0441   GLUCOSE 130 (H) 04/25/2019 0441   BUN 49 (H) 04/25/2019 0441   BUN 38 (H) 10/12/2017 1126   CREATININE 3.89 (H) 04/25/2019 0441   CALCIUM 9.1 04/25/2019 0441   GFRNONAA 13 (L) 04/25/2019 0441   GFRAA 16 (L) 04/25/2019 0441   CBC    Component Value Date/Time   WBC 8.3 04/25/2019 0441   RBC 3.35 (L) 04/25/2019 0441   HGB 10.4 (L) 04/25/2019 0441   HGB 13.2 06/19/2017 0940   HCT 33.1 (L) 04/25/2019 0441   HCT 38.1 06/19/2017 0940   PLT 426 (H)  04/25/2019 0441   PLT 201 06/19/2017 0940   MCV 98.8 04/25/2019 0441   MCV 93 06/19/2017 0940   MCH 31.0 04/25/2019 0441   MCHC 31.4 04/25/2019 0441   RDW 13.5 04/25/2019 0441   RDW 14.1 06/19/2017 0940   LYMPHSABS 1.5 04/18/2019 1652   MONOABS 0.8 04/18/2019 1652   EOSABS 0.1 04/18/2019 1652   BASOSABS 0.0 04/18/2019 1652    Assessment/Plan: 1.  AKI/CKD stage V- in setting of decompensated CHF and diuresis.  His Cr is slightly improved from yesterday but still above his baseline.  He is without uremic symptoms and is planning to start PD training on 05/05/19. 1. No uremic symptoms  2. Continue with plans for starting PD training on 3/8 2. Acute diastolic CHF- improving with diuretics.  Cardiology has transitioned to lasix 80 mg PO BID.  3. Acute hypoxic respiratory failure- thought to be due to pulmonary edema. Better with diuretics and off of oxygen  4. Bleeding from PD catheter exit site- resolved; he is s/p DDAVP  5. CAD s/p CABG- stable 6. Hypokalemia- improved/stable.  Replete today x 1 and can start potassium 10 meq daily at home.  Encouraged him to integrate a few high potassium foods into diet. 7. Anemia of CKD- initiated ESA. (received 40 mcg on 2/22) 8. HTN- acceptable control  9. DM per primary.  dispo - for discharge today.  I have requested follow-up with [facility] for one week post-discharge.  I spoke with home training about his discharge as well and they will coordinate with him re: next flush   Claudia Desanctis, MD 04/25/2019 12:54 PM

## 2019-04-25 NOTE — Discharge Summary (Signed)
Discharge Summary    Patient ID: [name] MRN: [id number]; DOB: 1933/04/16  Admit date: 04/18/2019 Discharge date: 04/25/2019  Primary Care Provider: Jani Gravel, MD  Primary Cardiologist: Pixie Casino, MD   Discharge Diagnoses    Active Problems:   Congestive heart failure [facility])   Acute on chronic diastolic (congestive) heart failure (HCC)   Acute coronary syndrome [facility])   Hypokalemia   ESRD needing dialysis [facility])  Diagnostic Studies/Procedures    Echocardiogram 04/19/19:  1. Since last echo, estimated RV pressure has increased. Aortic stenosis  is unchanged.  2. Left ventricular ejection fraction, by estimation, is 60 to 65%. The  left ventricle has normal function. The left ventricle has no regional  wall motion abnormalities. There is mild left ventricular hypertrophy.  Left ventricular diastolic parameters  are consistent with Grade II diastolic dysfunction (pseudonormalization).  Elevated left atrial pressure.  3. Right ventricular systolic function is normal. The right ventricular  size is mildly enlarged. There is severely elevated pulmonary artery  systolic pressure. The estimated right ventricular systolic pressure is  32.9 mmHg.  4. Left atrial size was mild to moderately dilated.  5. Right atrial size was moderately dilated.  6. The mitral valve is normal in structure and function. Mild mitral  valve regurgitation. No evidence of mitral stenosis.  7. Tricuspid valve regurgitation is moderate to severe.  8. The aortic valve is tricuspid. Aortic valve regurgitation is not  visualized. Mild to moderate aortic valve stenosis. Aortic valve area, by  VTI measures 1.18 cm. Aortic valve mean gradient measures 17.0 mmHg.  Aortic valve Vmax measures 2.84 m/s.  9. The inferior vena cava is dilated in size with >50% respiratory  variability, suggesting right atrial pressure of 8 mmHg.  _____________   History of Present Illness     [name] is  a 86 y.o. male with a hx of chronic kidney disease stage IV, type 2 diabetes, prostate cancer, hypertension, coronary disease status post CABG 1987 and chronic diastolic heart failure admitted with shortness of breath, mildly elevated troponin335. Covid negative.  [name] presented with progressive SOB for the last several months which acutely worsened two days prior to hospital presentation. He is followed closely by nephrology and recently had a peritoneal dialysis site placed by a vascular surgeon within the past month although he was noted to continue to make urine. He is on Lasix for congestive heart failure which was recently doubled from once daily to twice daily given his symptoms of BLE and weight gain. His HsT were found to be elevated at 335 therefore cardiology was asked to evaluate and ultimately admit.   Hospital Course     He was started on IV Hep infusion for possible ACS given elevated trops however had no complaints of angina. CXR showed pulmonary edema versus PNA. BNP was also elevated at 1406. Also with leukocytosis and low grade fever. COVID negative. He was ultimately started on IV Lasix 80mg  BID and his potassium was replaced multiple times throughout his hospital course. Given his elevated white count, he was started on empiric antibiotic therapy. HsT was considered low level and flat and in the absence of chest pain or other anginal symptoms, there was very low suspicion for ACS therefore IV Heparin was discontinued.   Repeat echocardiogram showed normal LV function with NRWMA and G2DD, mod TR, mod AS, essentially unchanged from prior studies. Given his hx of CKD Stage V and anticipation of peritoneal dialysis, nephrology was consulted for  diuretic recommendations. They followed along throughout his stay.   He continued to diuresis well on IV Lasix however with minimal improvement in LE edema. He was having issues with oxygen saturations with ambulation in particular. PT/OT  both evaluated the patient and recommended [facility] PT/OT with no specific equipment requirements. Initally he was ambulating with supplemental oxygen however the goal was to discharge without home oxygen therapy.   Initially the plan was to discharge on 04/24/19 however nephrology recommended one additional day of IV diuretics prior to PO transition.   Hospital problems include:  1.Acute on chronic diastolic heart failure: -Felt to be secondary to pulmonary edema given CXRimaginghowever alsobeingtreated for PNA with ceftriaxone given leukocytosis and fever during hospital course. -Echocardiogram performed 2/20 whichshowed normal LV systolic function with EF 60-65%, no R WMA, mild LVH, grade 2 diastolic dysfunction. There was increased estimated RV pressure. -Currently being diuresed with IV Lasix 80 mg twice daily with transition to PO Lasix 80mg  PO BID yesterday afternoon  -Weight, 169lb today, down from172.2lb yesterday  -I&O,net negative 9.4L -Plan was initially for hospital discharge to2/24however had desaturations with ambulation off supplemental oxygen thereforewaskept anadditional night -Todayappears to be doing well>>on RA currently>>OT ambulated with patient on RA>>stable. Did have recommendations for [place] nephrology assistance  2.CAD: -S/p CABG 1987 right/left heart cath 05/2017 showed 3/3 patent bypass grafts. -Denies anginal symptoms -Continue ASA, statin  3.Hypertension: -WCBJSE,831/51>761/60>737/10 -Patient with a history of orthostatic complaints therefore favoring permissive hypertension to minimize symptoms -Continue amlodipine, carvedilol, hydralazine and Lasix  4.AKI/CKD stage V: -Followed by nephrology -Plans for initiation of peritoneal dialysis, PD training to start 05/05/2019 -Serum creatinine 4.36>3.55>3.54>>3.89 today   5.Hyperlipidemia: -LDL, 56>>>at goal of less than 70  -Continue statin  6.Diabetes type  2: -Hemoglobin A1c, 7.3 -On SSI for glucose control while inpatient status -Restart home regimen at discharge  7.Anemia of CKD: -Hemoglobin has drifted down from 11.4 to 9.0. -Followed by nephrology with plan for EPA  8.Aortic stenosis: -Noted to be mild on right/left heart cath in 05/2017. -Echo done on 2/20 showed no change in aortic stenosis, mild-mod -No syncopal episodes  9.Deconditioning: -PT recommends home health PT, will arrange after d/c -For [facility] OT as well  -Ambulated on RA 2/25>>stable   10. Hypokalemia: -Resolved, K+ today 3.5 -Nephrology recommends 44meq maintenance at home  Consultants: Nephrology   The patient was seen and examined by Dr. Debara Pickett who feels that he is stable and ready for discharge today, 04/25/19.   Did the patient have an acute coronary syndrome (MI, NSTEMI, STEMI, etc) this admission?:  No                               Did the patient have a percutaneous coronary intervention (stent / angioplasty)?:  No.  _____________  Discharge Vitals Blood pressure (!) 147/56, pulse 64, temperature 98.3 F (36.8 C), temperature source Oral, resp. rate 20, height 5\' 9"  (1.753 m), weight 76.8 kg, SpO2 99 %.  Filed Weights   04/23/19 0609 04/24/19 0104 04/25/19 0325  Weight: 79.3 kg 78.1 kg 76.8 kg   Labs & Radiologic Studies    CBC Recent Labs    04/24/19 0440 04/25/19 0441  WBC 8.6 8.3  HGB 9.8* 10.4*  HCT 31.7* 33.1*  MCV 100.3* 98.8  PLT 397 626*   Basic Metabolic Panel Recent Labs    04/24/19 0440 04/25/19 0441  NA 142 141  K 3.2* 3.5  CL 101 101  CO2 28 27  GLUCOSE 99 130*  BUN 48* 49*  CREATININE 3.54* 3.89*  CALCIUM 9.0 9.1   Liver Function Tests No results for input(s): AST, ALT, ALKPHOS, BILITOT, PROT, ALBUMIN in the last 72 hours. No results for input(s): LIPASE, AMYLASE in the last 72 hours. High Sensitivity Troponin:   Recent Labs  Lab 04/18/19 1652 04/18/19 1841  TROPONINIHS 335* 349*    BNP Invalid  input(s): POCBNP D-Dimer No results for input(s): DDIMER in the last 72 hours. Hemoglobin A1C No results for input(s): HGBA1C in the last 72 hours. Fasting Lipid Panel No results for input(s): CHOL, HDL, LDLCALC, TRIG, CHOLHDL, LDLDIRECT in the last 72 hours. Thyroid Function Tests No results for input(s): TSH, T4TOTAL, T3FREE, THYROIDAB in the last 72 hours.  Invalid input(s): FREET3 _____________  DG Chest Portable 1 View  Result Date: 04/18/2019 CLINICAL DATA:  86 year old male with shortness of breath. EXAM: PORTABLE CHEST 1 VIEW COMPARISON:  Chest radiograph dated 08/09/2017 FINDINGS: There is a background of emphysema. Small right pleural effusion with associated right lung base atelectasis or infiltrate. There is diffuse interstitial and airspace density primarily involving the mid to lower lung fields which may represent edema but concerning for pneumonia. Clinical correlation is recommended. No pneumothorax. There is mild cardiomegaly. Median sternotomy wires and CABG vascular clips. No acute osseous pathology. IMPRESSION: Small right pleural effusion with findings of edema, pneumonia or combination. Clinical correlation and follow-up recommended. Electronically Signed   By: Anner Crete M.D.   On: 04/18/2019 17:37   ECHOCARDIOGRAM COMPLETE  Result Date: 04/19/2019    ECHOCARDIOGRAM REPORT   Patient Name:   [name] Date of Exam: 04/19/2019 Medical Rec #:  [id number]       Height:       69.0 in Accession #:    [id number]      Weight:       176.5 lb Date of Birth:  03/06/1933        BSA:          1.959 m Patient Age:    71 years        BP:           142/70 mmHg Patient Gender: M               HR:           75 bpm. Exam Location:  Inpatient Procedure: 2D Echo Indications:    Acute Coronary Syndrome I24.9  History:        Patient has prior history of Echocardiogram examinations, most                 recent 02/28/2017. CHF, CAD, Prior CABG; Risk Factors:Diabetes,                  Hypertension and Dyslipidemia.  Sonographer:    Mikki Santee RDCS (AE) Referring Phys: [place]  1. Since last echo, estimated RV pressure has increased. Aortic stenosis is unchanged.  2. Left ventricular ejection fraction, by estimation, is 60 to 65%. The left ventricle has normal function. The left ventricle has no regional wall motion abnormalities. There is mild left ventricular hypertrophy. Left ventricular diastolic parameters are consistent with Grade II diastolic dysfunction (pseudonormalization). Elevated left atrial pressure.  3. Right ventricular systolic function is normal. The right ventricular size is mildly enlarged. There is severely elevated pulmonary artery systolic pressure. The estimated right ventricular systolic pressure is 37.1 mmHg.  4.  Left atrial size was mild to moderately dilated.  5. Right atrial size was moderately dilated.  6. The mitral valve is normal in structure and function. Mild mitral valve regurgitation. No evidence of mitral stenosis.  7. Tricuspid valve regurgitation is moderate to severe.  8. The aortic valve is tricuspid. Aortic valve regurgitation is not visualized. Mild to moderate aortic valve stenosis. Aortic valve area, by VTI measures 1.18 cm. Aortic valve mean gradient measures 17.0 mmHg. Aortic valve Vmax measures 2.84 m/s.  9. The inferior vena cava is dilated in size with >50% respiratory variability, suggesting right atrial pressure of 8 mmHg. FINDINGS  Left Ventricle: Left ventricular ejection fraction, by estimation, is 60 to 65%. The left ventricle has normal function. The left ventricle has no regional wall motion abnormalities. The left ventricular internal cavity size was normal in size. There is  mild left ventricular hypertrophy. Left ventricular diastolic parameters are consistent with Grade II diastolic dysfunction (pseudonormalization). Elevated left atrial pressure. Right Ventricle: The right ventricular size is mildly  enlarged. No increase in right ventricular wall thickness. Right ventricular systolic function is normal. There is severely elevated pulmonary artery systolic pressure. The tricuspid regurgitant velocity is 4.18 m/s, and with an assumed right atrial pressure of 8 mmHg, the estimated right ventricular systolic pressure is 05.3 mmHg. Left Atrium: Left atrial size was mild to moderately dilated. Right Atrium: Right atrial size was moderately dilated. Pericardium: There is no evidence of pericardial effusion. Mitral Valve: The mitral valve is normal in structure and function. Normal mobility of the mitral valve leaflets. Mild mitral valve regurgitation. No evidence of mitral valve stenosis. Tricuspid Valve: The tricuspid valve is normal in structure. Tricuspid valve regurgitation is moderate to severe. No evidence of tricuspid stenosis. Aortic Valve: The aortic valve is tricuspid. . There is moderate thickening and moderate calcification of the aortic valve. Aortic valve regurgitation is not visualized. Mild to moderate aortic stenosis is present. There is moderate thickening of the aortic valve. There is moderate calcification of the aortic valve. Aortic valve mean gradient measures 17.0 mmHg. Aortic valve peak gradient measures 32.3 mmHg. Aortic valve area, by VTI measures 1.18 cm. Pulmonic Valve: The pulmonic valve was normal in structure. Pulmonic valve regurgitation is not visualized. No evidence of pulmonic stenosis. Aorta: The aortic root is normal in size and structure. Venous: The inferior vena cava is dilated in size with greater than 50% respiratory variability, suggesting right atrial pressure of 8 mmHg. IAS/Shunts: No atrial level shunt detected by color flow Doppler. Additional Comments: Since last echo, estimated RV pressure has increased. Aortic stenosis is unchanged.  LEFT VENTRICLE PLAX 2D LVIDd:         4.80 cm  Diastology LVIDs:         3.50 cm  LV e' lateral:   6.09 cm/s LV PW:         1.20 cm  LV  E/e' lateral: 24.5 LV IVS:        1.20 cm  LV e' medial:    4.46 cm/s LVOT diam:     2.10 cm  LV E/e' medial:  33.4 LV SV:         80.36 ml LV SV Index:   41.02 LVOT Area:     3.46 cm  RIGHT VENTRICLE RV S prime:     8.38 cm/s TAPSE (M-mode): 2.1 cm LEFT ATRIUM             Index       RIGHT ATRIUM  Index LA diam:        4.60 cm 2.35 cm/m  RA Area:     18.20 cm LA Vol (A2C):   82.1 ml 41.91 ml/m RA Volume:   49.20 ml  25.11 ml/m LA Vol (A4C):   65.0 ml 33.18 ml/m LA Biplane Vol: 79.1 ml 40.38 ml/m  AORTIC VALVE AV Area (Vmax):    1.43 cm AV Area (Vmean):   1.30 cm AV Area (VTI):     1.18 cm AV Vmax:           284.00 cm/s AV Vmean:          200.000 cm/s AV VTI:            0.680 m AV Peak Grad:      32.3 mmHg AV Mean Grad:      17.0 mmHg LVOT Vmax:         117.00 cm/s LVOT Vmean:        75.200 cm/s LVOT VTI:          0.232 m LVOT/AV VTI ratio: 0.34  AORTA Ao Root diam: 3.20 cm MITRAL VALVE                TRICUSPID VALVE MV Area (PHT): 3.27 cm     TR Peak grad:   69.9 mmHg MV Decel Time: 232 msec     TR Vmax:        418.00 cm/s MR Peak grad: 126.3 mmHg MR Mean grad: 84.0 mmHg     SHUNTS MR Vmax:      562.00 cm/s   Systemic VTI:  0.23 m MR Vmean:     439.0 cm/s    Systemic Diam: 2.10 cm MV E velocity: 149.00 cm/s MV A velocity: 141.00 cm/s MV E/A ratio:  1.06 Candee Furbish MD Electronically signed by Candee Furbish MD Signature Date/Time: 04/19/2019/4:15:51 PM    Final    Disposition   Pt is being discharged home today in good condition.  Follow-up Plans & Appointments    Follow-up Information    [facility] Follow up.   Why: HHPT, you can contact Cherly if you have any questions at [phone] , will start on Tuesday 3/2 Contact information: [address] [address] [phone]        Deberah Pelton, NP Follow up on 05/05/2019.   Specialty: Cardiology Why: at [place] information: [address] [place]  [address] [phone]          Discharge Instructions    ([place]) Call MD:  Anytime you have any of the following symptoms: 1) 3 pound weight gain in 24 hours or 5 pounds in 1 week 2) shortness of breath, with or without a dry hacking cough 3) swelling in the hands, feet or stomach 4) if you have to sleep on extra pillows at night in order to breathe.   Complete by: As directed    Call MD for:  difficulty breathing, headache or visual disturbances   Complete by: As directed    Call MD for:  extreme fatigue   Complete by: As directed    Call MD for:  hives   Complete by: As directed    Call MD for:  persistant dizziness or light-headedness   Complete by: As directed    Call MD for:  persistant nausea and vomiting   Complete by: As directed    Call MD for:  redness, tenderness,  or signs of infection (pain, swelling, redness, odor or green/yellow discharge around incision site)   Complete by: As directed    Call MD for:  severe uncontrolled pain   Complete by: As directed    Call MD for:  temperature >100.4   Complete by: As directed    Diet - low sodium heart healthy   Complete by: As directed    Increase activity slowly   Complete by: As directed       Discharge Medications   Allergies as of 04/25/2019   No Known Allergies     Medication List    TAKE these medications   acetaminophen 650 MG CR tablet Commonly known as: TYLENOL Take 650 mg by mouth at bedtime.   amLODipine 10 MG tablet Commonly known as: NORVASC Take 10 mg by mouth at bedtime.   aspirin EC 81 MG tablet Take 81 mg by mouth daily.   atorvastatin 80 MG tablet Commonly known as: LIPITOR Take 0.5 tablets (40 mg total) by mouth at bedtime.   carboxymethylcellulose 0.5 % Soln Commonly known as: REFRESH PLUS Place 1 drop into both eyes 3 (three) times daily as needed (dry, itch).   carvedilol 3.125 MG tablet Commonly known as: COREG TAKE 1 TABLET (3.125 MG TOTAL) BY MOUTH 2 (TWO)  TIMES DAILY WITH A MEAL.   Cholecalciferol 25 MCG (1000 UT) tablet Take by mouth.   folic acid 301 MCG tablet Commonly known as: FOLVITE Take 800 mcg by mouth daily.   furosemide 80 MG tablet Commonly known as: LASIX Take 1 tablet (80 mg total) by mouth 2 (two) times daily at 10 AM and 5 PM. What changed:   medication strength  when to take this  additional instructions   glimepiride 4 MG tablet Commonly known as: AMARYL Take 4 mg by mouth daily with breakfast.   hydrALAZINE 50 MG tablet Commonly known as: APRESOLINE Take 1 tablet (50 mg total) by mouth 3 (three) times daily. What changed:   medication strength  how much to take   multivitamin tablet Take 1 tablet by mouth daily.   nitroGLYCERIN 0.4 MG SL tablet Commonly known as: NITROSTAT Place 1 tablet (0.4 mg total) under the tongue every 5 (five) minutes x 3 doses as needed for chest pain.   OVER THE COUNTER MEDICATION Place 1 spray into the nose daily as needed. Sovereign Silver Nasal Spray for Immune Support   OVER THE COUNTER MEDICATION Take 1 capsule by mouth every morning.   potassium chloride 10 MEQ tablet Commonly known as: KLOR-CON Take 1 tablet (10 mEq total) by mouth daily.   Tradjenta 5 MG Tabs tablet Generic drug: linagliptin Take 5 mg by mouth every morning.   traZODone 50 MG tablet Commonly known as: DESYREL Take 50 mg by mouth at bedtime as needed for sleep.       Outstanding Labs/Studies   BMET   Duration of Discharge Encounter   Greater than 30 minutes including physician time.  Signed, Kathyrn Drown, NP 04/25/2019, 11:58 AM

## 2019-04-25 NOTE — Plan of Care (Signed)
  Problem: Education: Goal: Knowledge of General Education information will improve Description Including pain rating scale, medication(s)/side effects and non-pharmacologic comfort measures Outcome: Progressing   

## 2019-04-28 ENCOUNTER — Other Ambulatory Visit: Payer: Self-pay | Admitting: *Deleted

## 2019-04-28 MED ORDER — NITROGLYCERIN 0.4 MG SL SUBL: 0.4000 mg | SUBLINGUAL_TABLET | SUBLINGUAL | 4 refills | Status: DC | PRN

## 2019-04-29 ENCOUNTER — Other Ambulatory Visit: Payer: Self-pay

## 2019-04-29 ENCOUNTER — Ambulatory Visit: Payer: Medicare Other | Attending: Internal Medicine

## 2019-04-29 DIAGNOSIS — Z23 Encounter for immunization: Secondary | ICD-10-CM

## 2019-04-29 NOTE — Progress Notes (Signed)
   Covid-19 Vaccination Clinic  Name:  [name]    MRN: [id number] DOB: 1933-11-28  04/29/2019  [name] was observed post Covid-19 immunization for 15 minutes without incident. He was provided with Vaccine Information Sheet and instruction to access the V-Safe system.   [name] was instructed to call 911 with any severe reactions post vaccine: Marland Kitchen Difficulty breathing  . Swelling of face and throat  . A fast heartbeat  . A bad rash all over body  . Dizziness and weakness   Immunizations Administered    Name Date Dose VIS Date Route   Pfizer COVID-19 Vaccine 04/29/2019  1:22 PM 0.3 mL 02/07/2019 Intramuscular   Manufacturer: [place]   Lot: [id number]   [place]: [id number]

## 2019-04-30 ENCOUNTER — Telehealth (HOSPITAL_COMMUNITY): Payer: Self-pay | Admitting: Medical

## 2019-04-30 NOTE — Telephone Encounter (Signed)
Called patient to schedule Echocardiogram and he declined and stated that he had one already in February when he was in the hospital at [facility].  Report is in chart and note sent to Roby Lofts (ordering dr).

## 2019-05-01 ENCOUNTER — Other Ambulatory Visit (HOSPITAL_COMMUNITY): Payer: Medicare Other

## 2019-05-02 ENCOUNTER — Other Ambulatory Visit: Payer: Self-pay | Admitting: *Deleted

## 2019-05-02 NOTE — Patient Outreach (Signed)
[facility]) Care Management  05/02/2019  [name] 09-Oct-1933 [id number]    Telephone Assessment-CHF  Primary provider to completed transition of care  RN spoke with spouse [name]) due to pt has been sleeping a lot and tired. RN introduced [facility] and the purpose for this case manager calling today due to pt's recent hospital. Offered additional education on pt's HF. Declined indicated pt has information already sent from the previous health coach. States pt is doing well with no HF symptoms ([place]) today however will need to start dialysis and will begin his classes on 3/8 for 2 weeks. Recent CAD appointments has been changed to 3/17 and primary provider is pending but aware of pt's recent hospitalization. Confirmed [facility] is working with pt for PT services. No other needs to address however will reiterate on the CHF and prevention measures related.  Caregiver indicates pt will want to continue [facility] services. Therefore plan of care continues to be discussed related to pt's HF with goals and interventions discussed. Offered weekly/monthly or quarterly calls based upon all other involved services and schedulings. Caregiver states pt is getting a lot of call and she would prefer monthly calls at this time in order to keep up with her scheduled and the pt. No other needs presented at this time and spouse very appeperative for the call today. Aware pt's primary provider Dr. Maudie Mercury will be updated on pt's disposition with [facility] services. Will send appropriate letter and scheduled the next follow up next month as requested.   THN CM Care Plan Problem One     Most Recent Value  Care Plan Problem One  Deficient Knowledge related to CHF symptom managmenent  Role Documenting the Problem One  Care Management Telephonic Coordinator  Care Plan for Problem One  Active  THN Long Term Goal   Pt will not have a hospitalization related to HF symptoms over the next 90 days.  THN Long Term Goal  Start Date  05/02/19  Interventions for Problem One Long Term Goal  Will reiterate on the plan of action related to HF and verify pt/caregiver has educational information to review for ongoing prevention measures on HF.  Will discuss the importance of early interventions to avoid readmission and what to do if acute symptoms should occur.  THN CM Short Term Goal #1   Pt will verblize two signs and symptoms of CHF exacerbation with shoud be reported to his provider in the next 30 days.  THN CM Short Term Goal #1 Start Date  05/02/19  Interventions for Short Term Goal #1  Will review CHF zones and when to reach out to his provider with precipitating symtpoms to his CAD provider.   THN CM Short Term Goal #2   Pt will self weight daily and record all weights if needed to his providers within the next 30 days.  THN CM Short Term Goal #2 Start Date  05/02/19  Interventions for Short Term Goal #2  Will stress the importance of daily weights and when to call his providers with 2 lbs overnight and 5 lbs within one week with any swelling ot his LE, hands or tunk area along with any SOB or issues with breathing. Will stress elevation to his LE to reduce any ongoing swelling .      Raina Mina, RN Care Management Coordinator [place] Office [phone]

## 2019-05-05 ENCOUNTER — Ambulatory Visit: Payer: Medicare Other | Admitting: General Practice

## 2019-05-05 ENCOUNTER — Ambulatory Visit: Payer: Medicare Other | Admitting: Internal Medicine

## 2019-05-13 NOTE — Progress Notes (Signed)
Cardiology Clinic Note   Patient Name: [name] Date of Encounter: 05/14/2019  Primary Care Provider:  Jani Gravel, MD Primary Cardiologist:  Pixie Casino, MD  Patient Profile   [name] 86 year old male presents today for follow-up evaluation of his shortness of breath, acute on chronic diastolic heart failure, CAD, essential hypertension, AKI, hyperlipidemia, and type 2 diabetes. Past Medical History    Past Medical History:  Diagnosis Date  . Arthritis    "a little in LLE" (06/20/2017)  . Chronic diastolic CHF (congestive heart failure) ([place])   . CKD (chronic kidney disease), stage IV ([place])   . Coronary artery disease    a. s/p CABG 1980s. b. stable by cath 05/2017.  Marland Kitchen Dyslipidemia   . Edema 08/08/2017   HANDS & LOWER EXTREMITES  . Hypertension   . Hypoalbuminemia   . Nephrotic range proteinuria   . Peripheral artery disease (H[facility])    mild  . Pneumonia ~ 1950 X 1  . Prostate cancer ([place]) ~ 2015  . Type II diabetes mellitus ([place])    Past Surgical History:  Procedure Laterality Date  . CARDIAC CATHETERIZATION     "a couple times" (06/20/2017)  . CATARACT EXTRACTION W/ INTRAOCULAR LENS  IMPLANT, BILATERAL Bilateral   . CORONARY ARTERY BYPASS GRAFT  03/06/1985   LAD and first diagonal/circumflex (sequential saphenous vein graft,cardioplegia  . INSERTION PROSTATE RADIATION SEED  ~ 2015  . NM MYOCAR PERF WALL MOTION  09/12/2006   no ischemia  . RIGHT/LEFT HEART CATH AND CORONARY/GRAFT ANGIOGRAPHY N/A 06/21/2017   Procedure: RIGHT/LEFT HEART CATH AND CORONARY/GRAFT ANGIOGRAPHY;  Surgeon: Burnell Blanks, MD;  Location: [facility];  Service: Cardiovascular;  Laterality: N/A;  . US ECHOCARDIOGRAPHY  11/04/2008   trace TR & MR    Allergies  No Known Allergies  History of Present Illness  [name] has a past medical history of chronic kidney disease stage V, type 2 diabetes, prostate cancer, HTN, CAD status post CABG 1987, and chronic  diastolic heart failure.  He was admitted with shortness of breath and mildly elevated troponins on 04/18/2019-04/25/2019.  He stated that he had noticed progressive shortness of breath over the last several months which had worsened 2 days prior to coming to the hospital.  He was followed closely by nephrology and recently had a peritoneal dialysis site placed by vascular surgeon.  He does continue to make urine.  He was on Lasix for congestive heart failure which had recently been doubled due to his bilateral lower extremity edema and weight gain.  His HsT was elevated at 335 and cardiology was consulted.  He was started on IV heparin due to concern for possible ACS.  He had no complaints of chest discomfort/angina.  His chest x-ray showed pulmonary edema versus PNA.  His BNP was also elevated at 1406.  He also had leukocytosis and low-grade fever.  His Covid was negative.  He was ultimately started on IV Lasix 80 mg twice daily and his potassium was replaced multiple times throughout his hospitalization.  Due to his elevated WBCs he was started on empiric antibiotic therapy.  His high-sensitivity troponins were low and flat and in the absence of chest pain/angina so there was a very low suspicion for ACS and his IV heparin was DC'd.  This follow-up echocardiogram showed normal LVEF with an RWMA and grade 2 diastolic dysfunction, mild TR, and moderate aortic stenosis, which was unchanged from prior studies.  Due to his CKD  stage V and anticipation of peritoneal dialysis, nephrology was consulted for diuretic recommendations.  His weight at discharge was 172 pounds.  He was transitioned from IV diuresis to p.o. Lasix 80 mg twice daily.  His amlodipine, carvedilol, hydralazine, and statin were continued.  He presents today for follow-up evaluation and states he feels well.  He has 2 more days and his peritoneal dialysis class before he will be able to do his peritoneal dialysis on his own.  His weight today is  155 pounds.  He has been walking regularly for an hour a day and avoiding sodium in his diet.  He continues to wear his lower extremity support stockings.  He goes on to state that he is received both of his COVID-19 vaccinations.  Today he denies chest pain, shortness of breath, lower extremity edema, fatigue, palpitations, melena, hematuria, hemoptysis, diaphoresis, weakness, presyncope, syncope, orthopnea, and PND.   Home Medications    Prior to Admission medications   Medication Sig Start Date End Date Taking? Authorizing Provider  acetaminophen (TYLENOL) 650 MG CR tablet Take 650 mg by mouth at bedtime.    [provider]  amLODipine (NORVASC) 10 MG tablet Take 10 mg by mouth at bedtime.    [provider]  aspirin EC 81 MG tablet Take 81 mg by mouth daily.    [provider]  atorvastatin (LIPITOR) 80 MG tablet Take 0.5 tablets (40 mg total) by mouth at bedtime. 04/03/19   Kroeger, Lorelee Cover., PA-C  carboxymethylcellulose (REFRESH PLUS) 0.5 % SOLN Place 1 drop into both eyes 3 (three) times daily as needed (dry, itch).    [provider]  carvedilol (COREG) 3.125 MG tablet TAKE 1 TABLET (3.125 MG TOTAL) BY MOUTH 2 (TWO) TIMES DAILY WITH A MEAL. 04/22/19   Hilty, Nadean Corwin, MD  Cholecalciferol 25 MCG (1000 UT) tablet Take by mouth.    [provider]  folic acid (FOLVITE) 073 MCG tablet Take 800 mcg by mouth daily.    [provider]  furosemide (LASIX) 80 MG tablet Take 1 tablet (80 mg total) by mouth 2 (two) times daily at 10 AM and 5 PM. 04/25/19   Tommie Raymond, NP  glimepiride (AMARYL) 4 MG tablet Take 4 mg by mouth daily with breakfast.     [provider]  hydrALAZINE (APRESOLINE) 50 MG tablet Take 1 tablet (50 mg total) by mouth 3 (three) times daily. 04/25/19   Kathyrn Drown D, NP  Multiple Vitamin (MULTIVITAMIN) tablet Take 1 tablet by mouth daily.     [provider]  nitroGLYCERIN (NITROSTAT) 0.4 MG SL tablet  Place 1 tablet (0.4 mg total) under the tongue every 5 (five) minutes x 3 doses as needed for chest pain. 04/28/19   Hilty, Nadean Corwin, MD  OVER THE COUNTER MEDICATION Place 1 spray into the nose daily as needed. Sovereign Silver Nasal Spray for Immune Support    [provider]  OVER THE COUNTER MEDICATION Take 1 capsule by mouth every morning.    [provider]  potassium chloride (KLOR-CON) 10 MEQ tablet Take 1 tablet (10 mEq total) by mouth daily. 04/25/19   Tommie Raymond, NP  TRADJENTA 5 MG TABS tablet Take 5 mg by mouth every morning.  02/27/17   [provider]  traZODone (DESYREL) 50 MG tablet Take 50 mg by mouth at bedtime as needed for sleep.  07/12/17   [provider]    Family History    Family History  Problem Relation Age of Onset  . Heart attack Mother    He indicated that his mother is deceased. He indicated that his father is deceased. He indicated that his maternal grandmother is deceased. He indicated that his maternal grandfather is deceased. He indicated that his paternal grandmother is deceased. He indicated that his paternal grandfather is deceased.  Social History    Social History   Socioeconomic History  . Marital status: Married    Spouse name: Not on file  . Number of children: Not on file  . Years of education: Not on file  . Highest education level: Not on file  Occupational History  . Not on file  Tobacco Use  . Smoking status: Former Smoker    Packs/day: 1.00    Years: 20.00    Pack years: 20.00    Types: Cigarettes    Quit date: 08/22/1972    Years since quitting: 46.7  . Smokeless tobacco: Never Used  Substance and Sexual Activity  . Alcohol use: Not Currently    Comment: 06/20/2017 "nothing since ~ 1974"  . Drug use: No  . Sexual activity: Not Currently  Other Topics Concern  . Not on file  Social History Narrative  . Not on file   Social Determinants of Health   Financial Resource Strain:   .  Difficulty of Paying Living Expenses:   Food Insecurity: No Food Insecurity  . Worried About Charity fundraiser in the Last Year: Never true  . Ran Out of Food in the Last Year: Never true  Transportation Needs: No Transportation Needs  . Lack of Transportation (Medical): No  . Lack of Transportation (Non-Medical): No  Physical Activity:   . Days of Exercise per Week:   . Minutes of Exercise per Session:   Stress:   . Feeling of Stress :   Social Connections:   . Frequency of Communication with Friends and Family:   . Frequency of Social Gatherings with Friends and Family:   . Attends Religious Services:   . Active Member of Clubs or Organizations:   . Attends Archivist Meetings:   Marland Kitchen Marital Status:   Intimate Partner Violence:   . Fear of Current or Ex-Partner:   . Emotionally Abused:   Marland Kitchen Physically Abused:   . Sexually Abused:      Review of Systems    General:  No chills, fever, night sweats or weight changes.  Cardiovascular:  No chest pain, dyspnea on exertion, edema, orthopnea, palpitations, paroxysmal nocturnal dyspnea. Dermatological: No rash, lesions/masses Respiratory: No cough, dyspnea Urologic: No hematuria, dysuria Abdominal:   No nausea, vomiting, diarrhea, bright red blood per rectum, melena, or hematemesis Neurologic:  No visual changes, wkns, changes in mental status. All other systems reviewed and are otherwise negative except as noted above.  Physical Exam    VS:  BP 140/80   Pulse 70   Ht 5' 11.5" (1.816 m)   Wt 155 lb (70.3 kg)   SpO2 99%   BMI 21.32 kg/m  , BMI Body mass index is 21.32 kg/m. GEN: Well nourished, well developed, in no acute distress. HEENT: normal. Neck: Supple, no JVD, carotid bruits, or masses. Cardiac: RRR, 3/6 systolic murmur, rubs, or gallops. No clubbing, cyanosis, edema.  Radials/DP/PT 2+ and equal bilaterally.  Respiratory:  Respirations regular and unlabored, clear to auscultation bilaterally. GI: Soft,  nontender, nondistended, BS + x 4. MS: no deformity or atrophy. Skin: warm and dry, no rash. Neuro:  Strength and  sensation are intact. Psych: Normal affect.  Accessory Clinical Findings    ECG personally reviewed by me today-normal sinus rhythm minimal voltage criteria for LVH ST abnormality 70 bpm- No acute changes  EKG 04/19/2019 Normal sinus rhythm ST and T wave abnormalities consider lateral ischemia 75 bpm  Echocardiogram 04/19/19  1. Since last echo, estimated RV pressure has increased. Aortic stenosis  is unchanged.  2. Left ventricular ejection fraction, by estimation, is 60 to 65%. The  left ventricle has normal function. The left ventricle has no regional  wall motion abnormalities. There is mild left ventricular hypertrophy.  Left ventricular diastolic parameters  are consistent with Grade II diastolic dysfunction (pseudonormalization).  Elevated left atrial pressure.  3. Right ventricular systolic function is normal. The right ventricular  size is mildly enlarged. There is severely elevated pulmonary artery  systolic pressure. The estimated right ventricular systolic pressure is  26.8 mmHg.  4. Left atrial size was mild to moderately dilated.  5. Right atrial size was moderately dilated.  6. The mitral valve is normal in structure and function. Mild mitral  valve regurgitation. No evidence of mitral stenosis.  7. Tricuspid valve regurgitation is moderate to severe.  8. The aortic valve is tricuspid. Aortic valve regurgitation is not  visualized. Mild to moderate aortic valve stenosis. Aortic valve area, by  VTI measures 1.18 cm. Aortic valve mean gradient measures 17.0 mmHg.  Aortic valve Vmax measures 2.84 m/s.  9. The inferior vena cava is dilated in size with >50% respiratory  variability, suggesting right atrial pressure of 8 mmHg.  Assessment & Plan   1.  Acute on chronic diastolic heart failure-no increased work of breathing today, no increased  fatigue.  Weight today 155 pounds, discharged at 172 pounds Continue p.o. Lasix 80 mg twice daily Continue potassium chloride 10 mEq daily Continue amlodipine 10 mg daily Continue carvedilol 3.125 twice daily Continue hydralazine 50 mg 3 times daily Heart healthy low-sodium diet Daily weights Lower extremity support stockings Elevate extremities when not active  Coronary artery disease-no chest pain today.  Status post CABG 1987.  Cardiac catheterization April 2019 showed 3 out of 3 patent bypass grafts Continue aspirin 81 mg tablet daily Continue atorvastatin 40 mg Continue amlodipine 10 mg Continue nitroglycerin 0.4 mg sublingual as needed Heart healthy low-sodium diet Increase physical activity as tolerated  Essential hypertension-BP today 140/80.  Well-controlled at home Continue amlodipine 10 mg daily Continue carvedilol 3.125 twice daily Continue hydralazine 50 mg 3 times daily Continue furosemide 80 mg twice daily Heart healthy low-sodium diet-salty 6 given Increase physical activity as tolerated Blood pressure log given  Hyperlipidemia-LDL 56 04/19/2019 Continue atorvastatin 40 mg daily Heart healthy low-sodium high-fiber diet Increase physical activity as tolerated  Acute kidney injury/CKD stage V-serum creatinine 3.89 at discharge. Followed by nephrology, follow-up visit tomorrow.  Disposition: Follow-up with Dr. Debara Pickett in 3 months.  Jossie Ng. [facility] [address] Office [phone] Fax [phone]

## 2019-05-14 ENCOUNTER — Ambulatory Visit: Payer: Medicare Other | Admitting: General Practice

## 2019-05-14 ENCOUNTER — Other Ambulatory Visit: Payer: Self-pay

## 2019-05-14 ENCOUNTER — Encounter: Payer: Self-pay | Admitting: General Practice

## 2019-05-14 VITALS — BP 140/80 | HR 70 | Ht 71.5 in | Wt 155.0 lb

## 2019-05-14 DIAGNOSIS — N179 Acute kidney failure, unspecified: Secondary | ICD-10-CM

## 2019-05-14 DIAGNOSIS — I5033 Acute on chronic diastolic (congestive) heart failure: Secondary | ICD-10-CM | POA: Diagnosis not present

## 2019-05-14 DIAGNOSIS — E7800 Pure hypercholesterolemia, unspecified: Secondary | ICD-10-CM | POA: Diagnosis not present

## 2019-05-14 DIAGNOSIS — I2510 Atherosclerotic heart disease of native coronary artery without angina pectoris: Secondary | ICD-10-CM

## 2019-05-14 DIAGNOSIS — N184 Chronic kidney disease, stage 4 (severe): Secondary | ICD-10-CM

## 2019-05-14 DIAGNOSIS — I10 Essential (primary) hypertension: Secondary | ICD-10-CM | POA: Diagnosis not present

## 2019-05-14 NOTE — Patient Instructions (Signed)
Medication Instructions:  The current medical regimen is effective;  continue present plan and medications.  *If you need a refill on your cardiac medications before your next appointment, please call your pharmacy*   Follow-Up: At [facility], you and your health needs are our priority.  As part of our continuing mission to provide you with exceptional heart care, we have created designated Provider Care Teams.  These Care Teams include your primary Cardiologist (physician) and Advanced Practice Providers (APPs -  Physician Assistants and Nurse Practitioners) who all work together to provide you with the care you need, when you need it.  We recommend signing up for the patient portal called "MyChart".  Sign up information is provided on this After Visit Summary.  MyChart is used to connect with patients for Virtual Visits (Telemedicine).  Patients are able to view lab/test results, encounter notes, upcoming appointments, etc.  Non-urgent messages can be sent to your provider as well.   To learn more about what you can do with MyChart, go to [email or web address].    Your next appointment:   3 month(s)  The format for your next appointment:   In Person  Provider:   You may see Pixie Casino, MD or one of the following Advanced Practice Providers on your designated Care Team:    Almyra Deforest, PA-C  Fabian Sharp, PA-C or   Roby Lofts, [place]   Other Instructions Use BP log Continue your support stockings Do daily weights.- call with any increase 3lbs in a day or 5lbs in a week.

## 2019-06-02 ENCOUNTER — Encounter: Payer: Self-pay | Admitting: *Deleted

## 2019-06-02 ENCOUNTER — Other Ambulatory Visit: Payer: Self-pay | Admitting: *Deleted

## 2019-06-02 NOTE — Patient Outreach (Signed)
[facility]) Care Management  06/02/2019  [name] May 11, 1933 [id number]    Telephone Assessment (Initial Assessment Completed)   RN spoke with pt's spouse today [name]) who provided today's information. States pt is doing "good" with no acute issues at this time. States pt is progressing with his ongoing recovery. Report pt has started back to his old routine walking everyday. Denies any swelling or symptoms of HF with daily weights within his set limites (last weight 155 lbs yesterday morning). Reports pt is now on home dialysis every evening with no reported issues with this process.   Discussed the information sent via HF management as spouse able to understanding the information translated to the pt. Pt remains in the GREEN with no acute symptoms or related problems. Pt completes his daily weights and aware to contact the provider with weight gained over 2-3 lbs for added interventions on his medications to reduce any fluid retention. Plan of care discussed with goals and adjusted interventions based upon pt's progress. Provider will continue to be updated quarterly and spouse continue to agree with the monthly follow up calls of inquires on pt's management of care.  THN CM Care Plan Problem One     Most Recent Value  Care Plan Problem One  Deficient Knowledge related to CHF symptom managmenent  Role Documenting the Problem One  Care Management Telephonic Coordinator  Care Plan for Problem One  Active  THN Long Term Goal   Pt will not have a hospitalization related to HF symptoms over the next 90 days.  THN Long Term Goal Start Date  05/02/19  Interventions for Problem One Long Term Goal  Will continue to verifiy pt's awareness on signs and symptoms and what to do if acute symtpoms should occur. Will continue ot encouraged adherence in managering his ongoing care.  THN CM Short Term Goal #1   Pt will verblize two signs and symptoms of CHF exacerbation with shoud be  reported to his provider in the next 30 days.  THN CM Short Term Goal #1 Start Date  05/02/19  Interventions for Short Term Goal #1  Will continue to educate on signs of HF and encouraged to review the printed information  THN CM Short Term Goal #2   Pt will self weight daily and record all weights if needed to his providers within the next 30 days.  THN CM Short Term Goal #2 Start Date  05/02/19  [facility] CM Short Term Goal #2 Met Date  06/02/19      Raina Mina, RN Care Management Coordinator [place] Office [phone]

## 2019-06-03 ENCOUNTER — Other Ambulatory Visit (INDEPENDENT_AMBULATORY_CARE_PROVIDER_SITE_OTHER): Payer: Medicare Other

## 2019-06-03 DIAGNOSIS — I10 Essential (primary) hypertension: Secondary | ICD-10-CM

## 2019-07-02 ENCOUNTER — Other Ambulatory Visit: Payer: Self-pay | Admitting: *Deleted

## 2019-07-02 NOTE — Patient Outreach (Signed)
[name]) Care Management  07/02/2019  KOHLER PELLERITO November 04, 1933 [id number]   Telephone Assessment-CHF  RN spoke with the pt's spouse [name]) for an update on pt's ongoing management of care. Reports pt is doing well maintaining his weight (70.1 kg-154 lbs) with no fluid retention or swelling noted anywhere at this time. RN verified pt remains in the GREEN zone with no acute issues. States pt continues to be more ambulatory with his walks. Stress the importance of managing this condition along with the risk involved if unmonitored. Discussed compression stockings to control pt's swelling if unmonitored and the possible risk to the heart function if HF is not monitored correctly.   Plan of care discussed with ongoing goals and interventions adjusted accordingly based upon pt's progress. Will continue to encouraged adherence to the discussed plan and offer any needed tools and/or ongoing needed resources. No other inquires or request at this time as spouse very grateful for the call today and information provided. Will follow up next month with the current ongoing care plan.  THN CM Care Plan Problem One     Most Recent Value  Care Plan Problem One  Deficient Knowledge related to CHF symptom managmenent  Role Documenting the Problem One  Care Management Telephonic Coordinator  Care Plan for Problem One  Active  THN Long Term Goal   Pt will not have a hospitalization related to HF symptoms over the next 90 days.  THN Long Term Goal Start Date  05/02/19  Interventions for Problem One Long Term Goal  WIll continue to reiterate on the HF zones and smyptom management to prevent acute symptoms from occurring and what to do if acute. Will verify pt remains in the GREEN zone.   THN CM Short Term Goal #1   Pt will verblize two signs and symptoms of CHF exacerbation with shoud be reported to his provider in the next 30 days.  THN CM Short Term Goal #1 Start Date  05/02/19  Interventions  for Short Term Goal #1  Will continue to review the printed material send to pt/caregiver on HF and symptoms to be aware of that maybe encountered. Will discuss what to do if acute and continue to cover more information on the telephone calls over the month for increase knowledge base and teach-back from the caregiver/pt.      Raina Mina, RN Care Management Coordinator [place] Office [phone]

## 2019-07-15 ENCOUNTER — Ambulatory Visit: Payer: Medicare Other | Admitting: *Deleted

## 2019-08-01 ENCOUNTER — Other Ambulatory Visit: Payer: Self-pay | Admitting: *Deleted

## 2019-08-01 NOTE — Patient Outreach (Signed)
[facility]) Care Management  08/01/2019  BRUIN BOLGER 15-Dec-1933 [id number]  Telephone Assessment-CHF  RN spoke with pt and spouse [name]) receiving an update on pt's ongoing management of care. Inquired on CHF management with daily weights as wife reports recent weights with today at 73.4 kg (161.48 lbs) and yesterday 72.2  Kg (158.84 lbs). Discussed with pt that he has gained 2.6 lbs overnight. RN again educated pt on if there has been a weight gained of 3 lbs over night or 5 lbs within one week to contact his provider for possible fluid retention however pt states based upon his home dialysis he has been provided a scale that has been set for monitoring on kg. RN further discussed possible symptoms if his is retaining fluid with swelling to his extremities and what to do if acute, especially with his breathing. RN ecourage pt to review the information packet on HF that was mail for ongoing increase in his knowledge back on what to do if symptoms become acute. RN discuss visual symptoms if pt can not do the conversion on kg to lbs related to his weight. Verified pt has completed [facility] with PT and continue to do well with no acute issues. Pt has also adjusted to home dialysis with assistance from his wife [name]). RN discussed the current plan of care along with goals and adjustments on intervention where needed. Pt remains receptive. RN further discussed if pt continue to do well over the next month maybe referred to a health coach for ongoing disease management services. Pt receptive as agreed to the next follow up appointment next month.  Plan: Will follow up next month on pt's progress and communicate with pt's provider this month on pt's disposition with [facility] services. Will very no additional tools or needs at this time as pt continues to manage his HF.  THN CM Care Plan Problem One     Most Recent Value  Care Plan Problem One  Deficient Knowledge related to CHF symptom  managmenent  Role Documenting the Problem One  Care Management Telephonic Coordinator  Care Plan for Problem One  Active  THN Long Term Goal   Pt will not have a hospitalization related to HF symptoms over the next 90 days.  THN Long Term Goal Start Date  05/02/19  Interventions for Problem One Long Term Goal  Will verified pt continue to work with managing his CHF with daily weights and  recognizing symptoms that can be acute and what to do. Will contiinue to ecouaged adherence with daily monitoring and ongoing documented weights for diaylsis and providers to view.  THN CM Short Term Goal #1   Pt will verbalize two signs and symptoms of CHF exacerbation with shoud be reported to his provider in the next 30 days.  THN CM Short Term Goal #1 Start Date  05/02/19  [facility] CM Short Term Goal #1 Met Date  08/01/19      Raina Mina, RN Care Management Coordinator [place] Office [phone]

## 2019-08-06 ENCOUNTER — Other Ambulatory Visit: Payer: Self-pay

## 2019-08-06 ENCOUNTER — Ambulatory Visit (INDEPENDENT_AMBULATORY_CARE_PROVIDER_SITE_OTHER): Payer: Medicare Other | Admitting: Internal Medicine

## 2019-08-06 ENCOUNTER — Encounter: Payer: Self-pay | Admitting: Internal Medicine

## 2019-08-06 VITALS — BP 140/62 | HR 66 | Ht 71.0 in | Wt 160.0 lb

## 2019-08-06 DIAGNOSIS — I739 Peripheral vascular disease, unspecified: Secondary | ICD-10-CM | POA: Diagnosis not present

## 2019-08-06 DIAGNOSIS — I350 Nonrheumatic aortic (valve) stenosis: Secondary | ICD-10-CM | POA: Diagnosis not present

## 2019-08-06 DIAGNOSIS — N186 End stage renal disease: Secondary | ICD-10-CM

## 2019-08-06 NOTE — Progress Notes (Signed)
OFFICE NOTE   Chief Complaint:  Follow-up CHF  Primary Care Physician: Jani Gravel, MD  HPI:  [name] is a 86 year old with a history of coronary artery disease status post CABG in 1987, hypertension, dyslipidemia, diabetes, and peripheral arterial disease which is mild. Over the past year he has done well from a cardiac standpoint. He does have a history of prostate cancer and underwent radiation seed implant for that. He has also been having problems with confusion and brain fog and his medications were adjusted, including holding his statin, which did not cause much improvement. This may be some early dementia. In addition, he has abnormal lower extremity Dopplers, which I repeated. This has actually shown mild improvement in his ABIs with ABI 1.1 on the right and ABI of 0.92 on the left. The bilateral posterior tibials were occluded and there was 50% to 69% reduction in left common iliac and about a 0 to 49% reduction in the right superficial femoral. Despite this, he has no symptoms of claudication. He also denies any chest pain. He has had some shortness of breath and this, however, seems to be related to this episode over the past several months where he reported initially he was thought stung by a bee, developed swelling on the left side of his face and then subsequently developed extreme weakness and there as concern of a rheumatoid arthritis. His symptoms have improved with a steroid burst and tapered. He is currently on prednisone as well as methotrexate. He has also had recent dizziness. He reports head and sinus congestion, which may be contributing to his symptoms. However some of the dizziness is associated with walking up a hill at the end of exercise or walking down hills particularly. Is not associated with change in position or quick head movements.  There is some associated fatigue and chest fullness with exercise and improves with rest, reminiscent of possible  angina.  He underwent bilateral carotid Dopplers which showed a mild amount of plaque. A nuclear stress test performed which demonstrated no reversible ischemia and preserved ejection fraction. He still reports he has some mild dizziness and does fatigue a little at the beginning of exercise but improves when he continues to exercise.  He is generally without complaints today. He reports that recently blood work indicated that his kidney function was worsening and that he is now scheduled to see a nephrologist next Monday. I do not yet have those results in front of me. His metformin was discontinued due to this but he remains on lisinopril and that may need to be adjusted. He denies any specific anginal symptoms. He has some very mild claudication symptoms but it improves with walking. He continues to have a systolic murmur best heard over the aortic area and does have a history of aortic sclerosis in the past.  [name] returns today for follow-up. Unfortunately says he hasn't taken his medicine today. He is supposed to take hydralazine 100 mg 3 times a day and for some reason has not taken 2 doses this morning. Blood pressure was very high at 196/86 but he denied any chest pain or headache. He says he going to take his medicine when he gets home. Is also on Flomax but no other blood pressure medicines. This is probably due to worsening renal function. Currently his creatinine is around 3 and is followed by Dr. Mercy Moore. Is also on Lipitor and cholesterol was recently checked by Dr. Wilson Singer.  10/07/2015  [name]  returns today for follow-up. Over the past year he's had no new complaints although his blood pressures been difficult to control. He's followed by Dr. Mercy Moore. He was previously on amlodipine but that's been discontinued. He is now on hydralazine, clonidine and valsartan. He did not take his medicines today and his blood pressure is elevated. He says he takes the clonidine once or twice  per day. He tries to void in the morning because it causes extreme fatigue. We had previously discussed the Catapres patch however cost was limiting. He also reports recently some worsening memory.  10/16/2016  [name] returns for follow-up. Many years since I last saw him. He's called in for some chest pain which she said got better. His last stress test was in 2016 which was negative for ischemia. He was noted to have some aortic sclerosis in 2015 however last year was noted to have a louder systolic murmur which sounds more like aortic stenosis. Blood pressures not been well controlled. Today's elevated again 160/70. He says his blood pressure medicines make him feel tired. He told me that he takes all for his blood pressure medicines at night. When asked further about his hydralazine which is supposed to be taken 3 times a day he says he does not do that. He then reported that his blood pressure typically runs higher in the morning and throughout the day. He currently denies any chest pain. He reports some left lower strandy swelling but also has numbness and tingling has been undergoing and back injections.  10/27/2016  [name] had adjustments on his medications. He is now taking a hydralazine 3 times a day, irbesartan in the morning and clonidine at night. I reviewed his blood pressures which tend to range in the 140s in the morning before taking medication, in the 120s in the afternoon after medication and typically in the 120s over 80s at night. This indicates adequate treatment of his blood pressure. He did undergo an echocardiogram for aortic stenosis. This demonstrated a normal LVEF of 60-65% with mild aortic stenosis and a mean gradient of 11 mmHg. He understands the implications of this and that we will continue to follow his aortic valve disease.  02/28/2017  [name] presents today with significant shortness of breath, weight gain and leg edema as well as increasing abdominal  girth over the past 2-3 weeks.  The symptoms came on gradually but clinically worsened.  Weight in August was 185 pounds, he now weighs 204 pounds.  Reports worsening shortness of breath, orthopnea and PND.  Blood pressure was also elevated today 183/75 and recently has been difficult to control.  His primary care provider recommended increasing his clonidine to 0.1 mg 3 times daily, however he has had significant daytime fatigue with this.  03/16/2017  Mr. Mccreadie returns today for follow-up of heart failure.  His weight back in August was 185 pounds and he gained up to 204 pounds.  When I last saw him I doubled his diuretic and increased his blood pressure medication.  Over the past 2 weeks he has diuresed back down to 182 pounds.  He feels markedly better.  Blood pressure is better controlled today 132/58.  He feels like his energy level has improved.  We obtain blood work when he was in congestive heart failure which showed an elevated creatinine over 2 and a baseline around 1.6.  He was hypokalemic and I increased his potassium.  His echo showed an EF of 65-70% with grade 2 diastolic  dysfunction and high LV filling pressures.  06/19/2017  Mr. Wix was seen today in follow-up.  I last saw him in January, at which time he was complaining of shortness of breath and edema.  Weight was up to 204 pounds.  He was given diuretics and his weight has come down significantly.  In fact, his creatinine rose up to 2.7 and weight was down to 182.  He saw his nephrologist, Dr. Hollie Salk, recently who decreased his Lasix further and started amlodipine 10 mg daily.  He now says over the past several months he has had progressive fatigue, decreased exercise tolerance and worsening shortness of breath.  He wonders if it is related to his aortic valve.  In January was noted to have normal systolic function and mild aortic stenosis.  On exam, he has what sounds like moderate left ear with an audible second heart sound.  He  again looks volume overloaded today and weight is back up to 193 pounds, up from 182 pounds.  He denies any chest pain.  01/02/2018  Mr. Corsino returns today for follow-up of his heart failure.  Recently he has been seen by nephrology.  He felt that he is renal failure has been fairly stable.  Plans were to possibly resume ARB therapy.  His aortic stenosis is mild to moderate and followed by [place].  His EKG shows sinus rhythm at 65.  He denies any worsening shortness of breath.  Weight has been stable.  I had referred him for heart cath in April 2019 which showed two-vessel coronary disease with 3 out of 3 patent bypass grafts.  Medical therapy was recommended however it was felt to be volume overloaded.  Subsequently he was admitted with heart failure and diuresis.  His weight now is down significantly to 170 pounds from 182 most recently.  He seems to be feeling much better.  08/06/2019  Mr. Mcgue was again admitted for heart failure in February of this past year.  Ultimately he had progression of renal failure with hypokalemia and congestive heart failure and transition to peritoneal dialysis.  Since then he is done much better and is fairly stable.  Weight is come down to about 155 although measured 160 in our office today.  Blood pressure has been well controlled at home.  He denies any palpitations or chest pain.  He is noted to have a history of aortic stenosis which sounds mild to moderate on physical exam.  EKG personally reviewed today shows sinus rhythm.  PMHx:  Past Medical History:  Diagnosis Date  . Arthritis    "a little in LLE" (06/20/2017)  . Chronic diastolic CHF (congestive heart failure) ([place])   . CKD (chronic kidney disease), stage IV ([place])   . Coronary artery disease    a. s/p CABG 1980s. b. stable by cath 05/2017.  Marland Kitchen Dyslipidemia   . Edema 08/08/2017   HANDS & LOWER EXTREMITES  . Hypertension   . Hypoalbuminemia   . Nephrotic range proteinuria   . Peripheral artery  disease ([facility])    mild  . Pneumonia ~ 1950 X 1  . Prostate cancer ([place]) ~ 2015  . Type II diabetes mellitus ([place])     Past Surgical History:  Procedure Laterality Date  . CARDIAC CATHETERIZATION     "a couple times" (06/20/2017)  . CATARACT EXTRACTION W/ INTRAOCULAR LENS  IMPLANT, BILATERAL Bilateral   . CORONARY ARTERY BYPASS GRAFT  03/06/1985   LAD and first diagonal/circumflex (sequential saphenous vein graft,cardioplegia  .  INSERTION PROSTATE RADIATION SEED  ~ 2015  . NM MYOCAR PERF WALL MOTION  09/12/2006   no ischemia  . RIGHT/LEFT HEART CATH AND CORONARY/GRAFT ANGIOGRAPHY N/A 06/21/2017   Procedure: RIGHT/LEFT HEART CATH AND CORONARY/GRAFT ANGIOGRAPHY;  Surgeon: Burnell Blanks, MD;  Location: [facility];  Service: Cardiovascular;  Laterality: N/A;  . US ECHOCARDIOGRAPHY  11/04/2008   trace TR & MR    FAMHx:  Family History  Problem Relation Age of Onset  . Heart attack Mother     SOCHx:   reports that he quit smoking about 46 years ago. His smoking use included cigarettes. He has a 20.00 pack-year smoking history. He has never used smokeless tobacco. He reports previous alcohol use. He reports that he does not use drugs.  ALLERGIES:  No Known Allergies  ROS: Pertinent items noted in HPI and remainder of comprehensive ROS otherwise negative.  HOME MEDS: Current Outpatient Medications  Medication Sig Dispense Refill  . acetaminophen (TYLENOL) 650 MG CR tablet Take 650 mg by mouth at bedtime.    Marland Kitchen amLODipine (NORVASC) 10 MG tablet Take 10 mg by mouth at bedtime.    Marland Kitchen aspirin EC 81 MG tablet Take 81 mg by mouth daily.    Marland Kitchen atorvastatin (LIPITOR) 80 MG tablet Take 0.5 tablets (40 mg total) by mouth at bedtime. 30 tablet 6  . carboxymethylcellulose (REFRESH PLUS) 0.5 % SOLN Place 1 drop into both eyes 3 (three) times daily as needed (dry, itch).    . carvedilol (COREG) 3.125 MG tablet TAKE 1 TABLET (3.125 MG TOTAL) BY MOUTH 2 (TWO) TIMES DAILY WITH A MEAL.  180 tablet 3  . Cholecalciferol 25 MCG (1000 UT) tablet Take by mouth.    . folic acid (FOLVITE) 106 MCG tablet Take 800 mcg by mouth daily.    . furosemide (LASIX) 80 MG tablet Take 1 tablet (80 mg total) by mouth 2 (two) times daily at 10 AM and 5 PM. 180 tablet 3  . glimepiride (AMARYL) 4 MG tablet Take 4 mg by mouth daily with breakfast.     . hydrALAZINE (APRESOLINE) 50 MG tablet Take 1 tablet (50 mg total) by mouth 3 (three) times daily. 180 tablet 3  . Multiple Vitamin (MULTIVITAMIN) tablet Take 1 tablet by mouth daily.     . nitroGLYCERIN (NITROSTAT) 0.4 MG SL tablet Place 1 tablet (0.4 mg total) under the tongue every 5 (five) minutes x 3 doses as needed for chest pain. 25 tablet 4  . OVER THE COUNTER MEDICATION Place 1 spray into the nose daily as needed. Sovereign Silver Nasal Spray for Immune Support    . OVER THE COUNTER MEDICATION Take 1 capsule by mouth every morning.    . potassium chloride (KLOR-CON) 10 MEQ tablet Take 1 tablet (10 mEq total) by mouth daily. 90 tablet 2  . TRADJENTA 5 MG TABS tablet Take 5 mg by mouth every morning.     . traZODone (DESYREL) 50 MG tablet Take 50 mg by mouth at bedtime as needed for sleep.   12   No current facility-administered medications for this visit.    LABS/IMAGING: No results found for this or any previous visit (from the past 48 hour(s)). No results found.  VITALS: BP 140/62   Pulse 66   Ht 5\' 11"  (1.803 m)   Wt 160 lb (72.6 kg)   BMI 22.32 kg/m   EXAM: General appearance: alert, no distress and moderately obese Neck: no carotid bruit, no JVD and thyroid  not enlarged, symmetric, no tenderness/mass/nodules Lungs: clear to auscultation bilaterally Heart: regular rate and rhythm, S1, S2 normal and systolic murmur: systolic ejection 3/6, crescendo at 2nd right intercostal space Abdomen: soft, non-tender; bowel sounds normal; no masses,  no organomegaly Extremities: extremities normal, atraumatic, no cyanosis or edema Pulses:  2+ and symmetric Skin: Skin color, texture, turgor normal. No rashes or lesions Neurologic: Grossly normal Psych: Pleasant  EKG: Normal sinus rhythm at 66, minimal voltage criteria for LVH, nonspecific T wave changes-personally reviewed  ASSESSMENT: 1.   Chronic diastolic congestive heart failure 1. ESRD on PD 2. Moderate aortic stenosis 3. Coronary artery disease status post CABG in 1987 -patent bypass grafts by cath (05/2017) 4. Hypertension 5. Hyperlipidemia 6. Diabetes type 2 7. Mild PAD - intermittent claudication, not lifestyle limiting 8. Possible rheumatoid arthritis  PLAN: 1.   Mr. Martine seems to be doing better with his volume status now on peritoneal dialysis.  Heart failure seems to be more regulated and weight is stable.  No new anginal symptoms.  He does have aortic stenosis which is moderate on exam.  He will need a follow-up echo next year.  Follow up in 6-8 months or sooner as necessary.  Pixie Casino, MD, [facility], [place] Director of the Advanced Lipid Disorders &  Cardiovascular Risk Reduction Clinic Attending Cardiologist  Direct Dial: [phone]  Fax: [phone]  Website:  [email or web address] 08/06/2019, 9:59 AM

## 2019-08-06 NOTE — Patient Instructions (Signed)
Medication Instructions:  Your physician recommends that you continue on your current medications as directed. Please refer to the Current Medication list given to you today.  *If you need a refill on your cardiac medications before your next appointment, please call your pharmacy*   Lab Work: NONE If you have labs (blood work) drawn today and your tests are completely normal, you will receive your results only by: Marland Kitchen MyChart Message (if you have MyChart) OR . A paper copy in the mail If you have any lab test that is abnormal or we need to change your treatment, we will call you to review the results.   Testing/Procedures: Echocardiogram in February 2022   Follow-Up: At [facility], you and your health needs are our priority.  As part of our continuing mission to provide you with exceptional heart care, we have created designated Provider Care Teams.  These Care Teams include your primary Cardiologist (physician) and Advanced Practice Providers (APPs -  Physician Assistants and Nurse Practitioners) who all work together to provide you with the care you need, when you need it.  We recommend signing up for the patient portal called "MyChart".  Sign up information is provided on this After Visit Summary.  MyChart is used to connect with patients for Virtual Visits (Telemedicine).  Patients are able to view lab/test results, encounter notes, upcoming appointments, etc.  Non-urgent messages can be sent to your provider as well.   To learn more about what you can do with MyChart, go to [email or web address].    Your next appointment:   February 2022  The format for your next appointment:   In Person  Provider:   You may see Pixie Casino, MD or one of the following Advanced Practice Providers on your designated Care Team:    Almyra Deforest, PA-C  Fabian Sharp, PA-C or   Roby Lofts, [place]    Other Instructions

## 2019-08-07 ENCOUNTER — Telehealth: Payer: Self-pay | Admitting: Internal Medicine

## 2019-08-07 NOTE — Telephone Encounter (Signed)
New Message   Pt c/o medication issue:  1. Name of Medication: carvedilol (COREG) 3.125 MG tablet  2. How are you currently taking this medication (dosage and times per day)? 1 tablet 2 x daily   3. Are you having a reaction (difficulty breathing--STAT)? No   4. What is your medication issue? Pt says he is breaking out from this medication. He says he has itchy bumps on his legs, arms and neck    Please call

## 2019-08-07 NOTE — Telephone Encounter (Signed)
Spoke with patient. For the last 3-4 weeks patient has had a rash that he was treating with cortisone cream and neosporin. For the last week and a half he has had a consistent rash on his legs (knees to waist), arms and left side of neck. He thought it was shingles but wasn't sure. It is described as itchy bumps. Patient states he read the paperwork that came with his carvedilol and it says it can cause a rash. Patient has been on the carvedilol since February.   Advised patient to call primary care physician to be evaluated for rash. Patient verbalized understanding. Will route to primary card as FYI.

## 2019-09-02 ENCOUNTER — Other Ambulatory Visit: Payer: Self-pay | Admitting: *Deleted

## 2019-09-02 NOTE — Patient Outreach (Signed)
[place] [facility]) Care Management  09/02/2019  [name] [birth year] [id number]   Telephone Assessment-CHF  RN spoke with both the pt and spouse concerning his ongoing progress. Reports recent weights both yesterday and today at 71 kg (156 lbs). States he has attended all appointments with his providers. [facility] PT has discharged pt from services. RN continue to encouraged mobility with ROM and short ambulation in and outside the home. Verified pt continue with home dialysis with no acute needs presented at this time. RN discussed the current plan of care and updated interventions accordingly based upon pt's progress. Will continue to educate pt on avoid the risk of fluid retention and what to do if acute symptoms are presented to his provider (CAD). Reports pt has an appointment pending his endocrinologist due to ongoing monitoring of his diabetes. States pt has some elevated readings however much improved with this planned follow up appointment. RN inquired further on possible education on this diagnosis however reports improvement pending appointment with his doctor.   Plan: Based upon pt's progress with follow up next month and inquired on both pt's HF and DM if both remains stable will transition to a health coach with [facility] however if pt needs further assistance on managing his diabetes will switch program for more in-dept teaching on managing this condition. No further inquires or needs presented at this time. Will update plan of care accordingly.  THN CM Care Plan Problem One     Most Recent Value  Care Plan Problem One Deficient Knowledge related to CHF symptom managmenent  Role Documenting the Problem One Care Management Telephonic Coordinator  Care Plan for Problem One Active  THN Long Term Goal  Pt will not have a hospitalization related to HF symptoms over the next 90 days.  THN Long Term Goal Start Date 05/02/19  Interventions for Problem One Long Term Goal Pt continue  to do well however need further education on the HF zones and what to do if acute. Will strongly encouraged pt and caregiver to review this sent printed material for awareness on signs and possible symptoms to report if acute. Will re-evaluate on the next follow up call.      Raina Mina, RN Care Management Coordinator [place] Office [phone]

## 2019-10-03 ENCOUNTER — Other Ambulatory Visit: Payer: Self-pay | Admitting: *Deleted

## 2019-10-03 NOTE — Patient Outreach (Signed)
[place] [facility]) Care Management  10/03/2019  [name] 07-09-1933 [id number]   Telephone Assessment-Heart Failure  RN spoke with the primary caregiver spouse [name]) who reports pt is doing "very well". States his weight is maintain at his baseline and pt continues to well with home dialysis with no additional issues. Pt with a sufficient supply of dialysis and medication with no needed refills. Plan of care discussed as pt continue to manage his care with no acute encounters. Reports pt's providers Dr. Quitman Livings (Kidney) and Dr. Maudie Mercury (primary) all continue to report good news with no acute issues. Reports [facility] discharged pt months ago and pt is now driving again with no problems.    Based upon pt's progress will transition to a Health Coach with [facility]. Will alert pt's primary and make a referral today. No other issues or needs as RN will continue to encouraged primary to inform pt to continue managing his care accordingly based upon today's discussed and review of his plan of care.  THN CM Care Plan Problem One     Most Recent Value  Care Plan Problem One Deficient Knowledge related to CHF symptom managmenent  Role Documenting the Problem One Care Management Telephonic Coordinator  Care Plan for Problem One Active  THN Long Term Goal  Pt will not have a hospitalization related to HF symptoms over the next 90 days.  THN Long Term Goal Start Date 05/02/19  Interventions for Problem One Long Term Goal Will extend and transition to a [facility] for ongoing  HF management. Pt goal to continue to improve with managing his care.      Raina Mina, RN Care Management Coordinator [place] Office [phone]

## 2019-10-06 ENCOUNTER — Other Ambulatory Visit: Payer: Self-pay | Admitting: *Deleted

## 2019-10-14 ENCOUNTER — Other Ambulatory Visit: Payer: Self-pay | Admitting: Cardiology

## 2019-10-31 ENCOUNTER — Other Ambulatory Visit: Payer: Self-pay | Admitting: *Deleted

## 2019-10-31 ENCOUNTER — Encounter: Payer: Self-pay | Admitting: *Deleted

## 2019-10-31 NOTE — Patient Outreach (Addendum)
[facility]) Care Management  [place]  10/31/2019   [name] 1933/07/10 [id number]  Subjective: Successful telephone outreach call to patient's wife HIPAA identifiers obtained. Nurse spoke to patient's wife [name] who reports that patient is doing well. Wife states that patient is taking his weight daily and has maintained it below the goal of 160. She does confirm the patient has received education about the CHF zones and action plans. Wife feels that patient's CHF is controlled at this time and explains he is not having issues of edema or SOB and is eating a low sodium diet. Patient recently started Lantus for his diabetes. His A1c elevated from 7 to 8.1. Nurse provided education regarding diabetes and suggested for them to check closely for hypoglycemia due to patient beginning insulin. Also, wife explained that the Lantus was very expensive. Nurse offered to send a referral to pharmacy for assistance but wife stated she would ask the patient when he returns about the referral and they would contact nurse to send a referral if needed. Wife states that patient is self administering his Peritoneal dialysis nightly. He is tolerating this well and he has all of the dialysis supplies he needs. Wife reports that patient is walking routinely for at least 30 minutes and denies that patient has had any falls. She feels that he is emotionally doing well and states presently that the patient is doing well with controlling all of his illnesses. Wife did not have any further questions or concerns and she did confirm that she had this nurse's contact information if needed.   Encounter Medications:  Outpatient Encounter Medications as of 10/31/2019  Medication Sig Note  . acetaminophen (TYLENOL) 650 MG CR tablet Take 650 mg by mouth at bedtime. 04/18/2019: Take every night per spouse   . amLODipine (NORVASC) 10 MG tablet Take 10 mg by mouth at bedtime.   Marland Kitchen aspirin EC 81 MG tablet Take  81 mg by mouth daily.   Marland Kitchen atorvastatin (LIPITOR) 80 MG tablet Take 0.5 tablets (40 mg total) by mouth at bedtime.   . carboxymethylcellulose (REFRESH PLUS) 0.5 % SOLN Place 1 drop into both eyes 3 (three) times daily as needed (dry, itch).   . carvedilol (COREG) 3.125 MG tablet TAKE 1 TABLET (3.125 MG TOTAL) BY MOUTH 2 (TWO) TIMES DAILY WITH A MEAL.   Marland Kitchen Cholecalciferol 25 MCG (1000 UT) tablet Take by mouth.   . folic acid (FOLVITE) 426 MCG tablet Take 800 mcg by mouth daily.   . furosemide (LASIX) 80 MG tablet Take 1 tablet (80 mg total) by mouth 2 (two) times daily at 10 AM and 5 PM.   . glimepiride (AMARYL) 4 MG tablet Take 4 mg by mouth daily with breakfast.    . hydrALAZINE (APRESOLINE) 50 MG tablet TAKE 1 TABLET BY MOUTH THREE TIMES A DAY   . insulin glargine (LANTUS) 100 UNIT/ML injection Inject into the skin.   . Multiple Vitamin (MULTIVITAMIN) tablet Take 1 tablet by mouth daily.    . nitroGLYCERIN (NITROSTAT) 0.4 MG SL tablet Place 1 tablet (0.4 mg total) under the tongue every 5 (five) minutes x 3 doses as needed for chest pain.   Marland Kitchen OVER THE COUNTER MEDICATION Place 1 spray into the nose daily as needed. Sovereign Silver Nasal Spray for Immune Support   . OVER THE COUNTER MEDICATION Take 1 capsule by mouth every morning.   . potassium chloride (KLOR-CON) 10 MEQ tablet Take 1 tablet (10 mEq total) by  mouth daily.   . TRADJENTA 5 MG TABS tablet Take 5 mg by mouth every morning.    . traZODone (DESYREL) 50 MG tablet Take 50 mg by mouth at bedtime as needed for sleep.     No facility-administered encounter medications on file as of 10/31/2019.    Functional Status:  In your present state of health, do you have any difficulty performing the following activities: 06/02/2019 04/19/2019  Hearing? N N  Vision? N N  Difficulty concentrating or making decisions? N N  Walking or climbing stairs? N N  Comment - -  Dressing or bathing? N N  Doing errands, shopping? N N  Preparing Food and eating  ? N -  Using the Toilet? N -  In the past six months, have you accidently leaked urine? N -  Do you have problems with loss of bowel control? N -  Managing your Medications? N -  Managing your Finances? N -  Housekeeping or managing your Housekeeping? N -  Comment - -  Some recent data might be hidden    Fall/Depression Screening: Fall Risk  06/02/2019 04/15/2019 02/10/2019  Falls in the past year? 0 0 0  Number falls in past yr: - 1 0  Injury with Fall? - 0 0  Risk for fall due to : - Impaired mobility;Impaired balance/gait Impaired balance/gait;Impaired mobility  Follow up - Falls evaluation completed Falls evaluation completed   PHQ 2/9 Scores 10/31/2019 06/02/2019 02/10/2019 11/11/2018 08/13/2018 04/16/2018 10/01/2017  PHQ - 2 Score 0 0 0 0 0 0 0   Goals Addressed            This Visit's Progress   . Patient will not have a hospitalization related to HF symptoms over the next 90 days.       CARE PLAN ENTRY (see longtitudinal plan of care for additional care plan information)   Current Barriers:  Marland Kitchen Knowledge deficit related to basic heart failure pathophysiology and self care management  Case Manager Clinical Goal(s):  Marland Kitchen Over the next 90 days, patient will verbalize understanding of Heart Failure Action Plan and when to call doctor . Over the next 90 days, patient will take all Heart Failure mediations as prescribed . Over the next 90 days, patient will weigh daily and record (notifying MD of 3 lb weight gain over night or 5 lb in a week) . Patient will verbalize two symptoms of CHF exacerbation within the next 90 days.   . Patient will verbalize receiving annual flu vaccine within the next 90 days.  . Patient will continue to walk 4-5 times weekly.  Interventions:  . Provided verbal education on low sodium diet . Reviewed Heart Failure Action Plan in depth and provided written copy . Assessed need for readable accurate scales in home . Provided education about placing scale on  hard, flat surface . Advised patient to weigh each morning after emptying bladder . Discussed importance of daily weight and advised patient to weigh and record daily  Patient Self Care Activities:  . Takes Heart Failure Medications as prescribed . Weighs daily and record (notifying MD of 3 lb weight gain over night or 5 lb in a week) . Verbalizes understanding of and follows CHF Action Plan . Adheres to low sodium diet . Patient walks routinely 4-5 times weekly  Initial goal documentation       Plan: RN Health Coach will send PCP a barrier letter and today's assessment note, will call patient within the month of  November and patient agrees to future outreach calls.   Emelia Loron RN, BSN [place] [phone] [email or web address]

## 2019-11-17 IMAGING — CR DG CHEST 2V
2 series · 2 of 2 positions shown · non-contrast
Comparison: 08/04/2009

CLINICAL DATA: Shortness of breath.

EXAM:
CHEST - 2 VIEW

[chest pa]
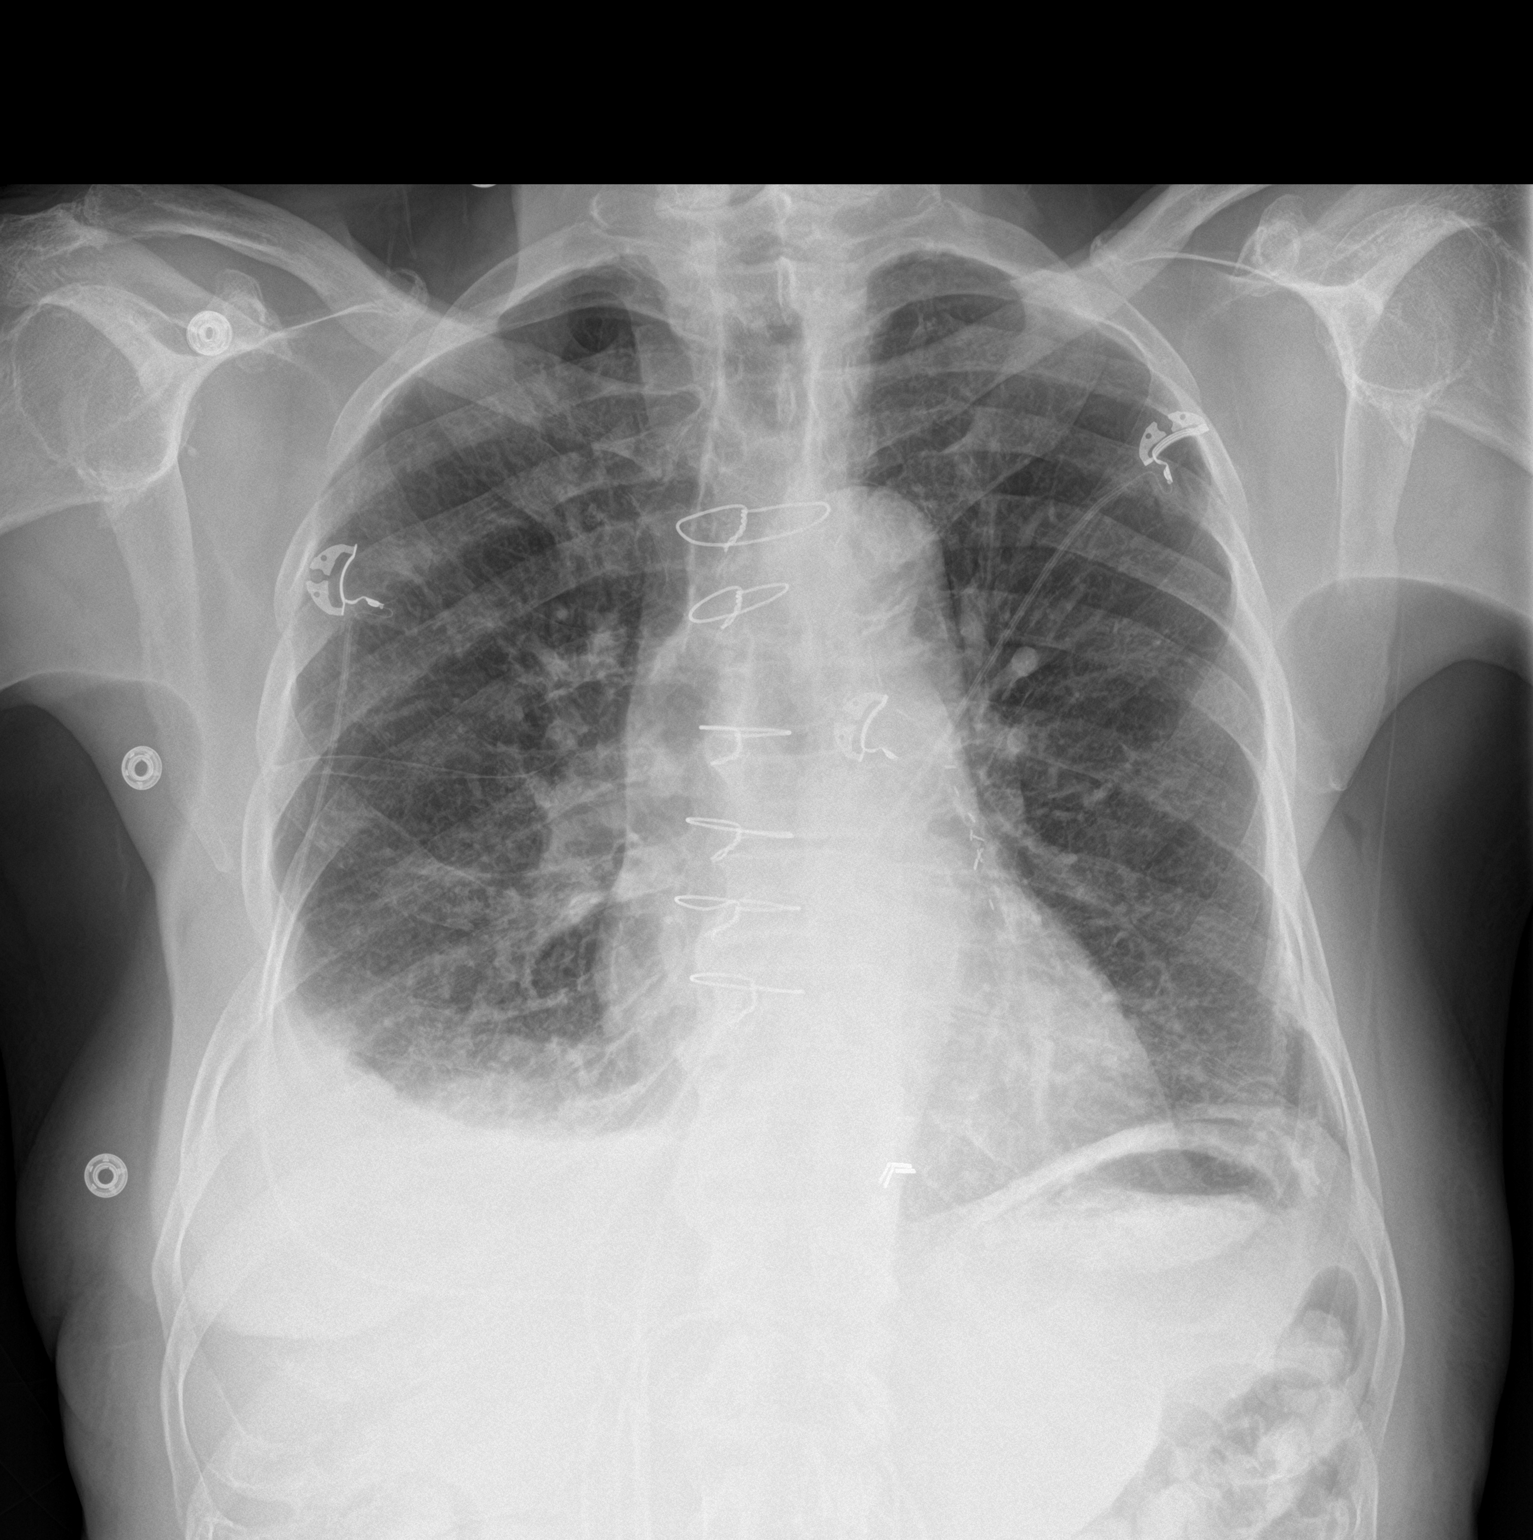

[chest lat]
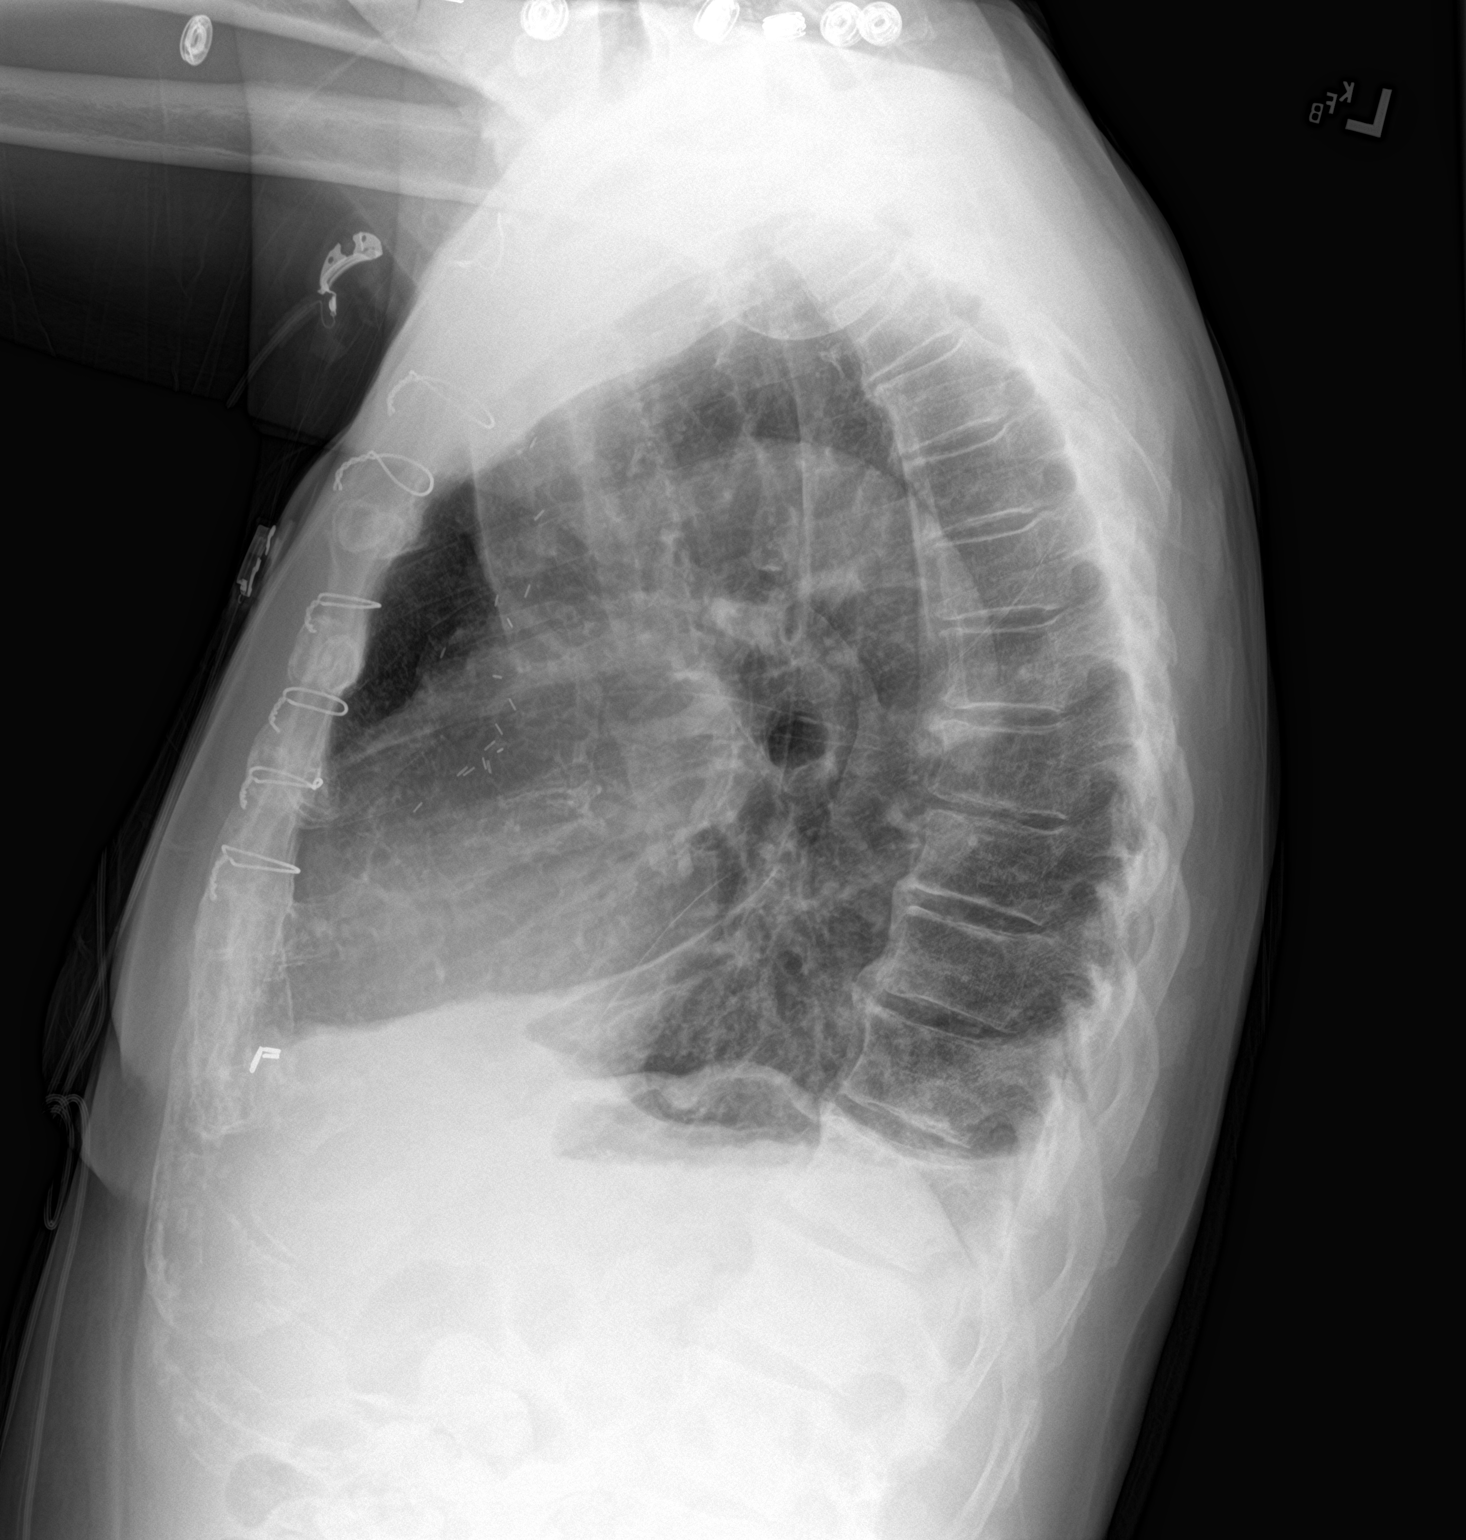

[2 of 2 positions shown; findings below may reference images not displayed]

FINDINGS: Postoperative changes in the mediastinum. Normal heart size and
pulmonary vascularity. Small bilateral pleural effusions, greater on
the right. Infiltration in the right lung base could be due to
pneumonia or atelectasis. No pneumothorax. Probable calcified
pleural plaques in the left costophrenic angle. Calcification of the
aorta. Degenerative changes in the spine and shoulders.
IMPRESSION: Small bilateral pleural effusions, greater on the right.
Infiltration in the right lung base could indicate pneumonia or
atelectasis. Aortic atherosclerosis.

## 2020-01-09 ENCOUNTER — Other Ambulatory Visit: Payer: Self-pay | Admitting: *Deleted

## 2020-01-09 ENCOUNTER — Ambulatory Visit (INDEPENDENT_AMBULATORY_CARE_PROVIDER_SITE_OTHER): Payer: Medicare Other | Admitting: Internal Medicine

## 2020-01-09 ENCOUNTER — Encounter: Payer: Self-pay | Admitting: Internal Medicine

## 2020-01-09 VITALS — BP 120/60 | HR 55 | Ht 71.0 in | Wt 171.8 lb

## 2020-01-09 DIAGNOSIS — N186 End stage renal disease: Secondary | ICD-10-CM | POA: Diagnosis not present

## 2020-01-09 DIAGNOSIS — E7800 Pure hypercholesterolemia, unspecified: Secondary | ICD-10-CM | POA: Diagnosis not present

## 2020-01-09 DIAGNOSIS — I25810 Atherosclerosis of coronary artery bypass graft(s) without angina pectoris: Secondary | ICD-10-CM | POA: Diagnosis not present

## 2020-01-09 DIAGNOSIS — I5032 Chronic diastolic (congestive) heart failure: Secondary | ICD-10-CM

## 2020-01-09 DIAGNOSIS — I350 Nonrheumatic aortic (valve) stenosis: Secondary | ICD-10-CM

## 2020-01-09 DIAGNOSIS — R42 Dizziness and giddiness: Secondary | ICD-10-CM

## 2020-01-09 MED ORDER — HYDRALAZINE HCL 50 MG PO TABS: 50.0000 mg | ORAL_TABLET | Freq: Two times a day (BID) | ORAL | 3 refills | Status: DC

## 2020-01-09 NOTE — Patient Outreach (Signed)
[facility]) Care Management  [place]  01/09/2020   VOYD GROFT 1933/08/01 [id number]  Subjective: Successful telephone outreach call to patient's wife [name]. HIPAA identifiers obtained. Nurse spoke to wife who reports the patient is doing well. Per wife the patient went to his nephrologist yesterday and got an excellent report. He continues to self administer his Peritoneal dialysis nightly. He is tolerating this well and he has all of the dialysis supplies he needs. [name] reports that the patient is walking routinely, adheres to his diets of low sodium, ADA, and renal. She explains that his weight has maintained consistently but his B/P has been elevated. [name] reports that the patient does have an appointment with his cardiologist today and this nurse informed her that he should bring his recorded daily values of his blood pressure and weight. Wife verbalized understanding. Wife states that the patient has not had any recent falls and that there is no needs for DME. Their daughter is supportive and the patient has had his annual flu shot and covid booster. [name] would like nurse to send CHF, meal planning and hypertension education. Wife did not have any further questions or concerns and she did confirm that she has this nurse's contact information if needed.   Encounter Medications:  Outpatient Encounter Medications as of 01/09/2020  Medication Sig Note  . acetaminophen (TYLENOL) 650 MG CR tablet Take 650 mg by mouth at bedtime. 04/18/2019: Take every night per spouse   . amLODipine (NORVASC) 10 MG tablet Take 10 mg by mouth at bedtime.   Marland Kitchen aspirin EC 81 MG tablet Take 81 mg by mouth daily.   Marland Kitchen atorvastatin (LIPITOR) 80 MG tablet Take 0.5 tablets (40 mg total) by mouth at bedtime.   . carboxymethylcellulose (REFRESH PLUS) 0.5 % SOLN Place 1 drop into both eyes 3 (three) times daily as needed (dry, itch).   . carvedilol (COREG) 3.125 MG tablet TAKE 1 TABLET (3.125  MG TOTAL) BY MOUTH 2 (TWO) TIMES DAILY WITH A MEAL.   Marland Kitchen Cholecalciferol 25 MCG (1000 UT) tablet Take by mouth.   . folic acid (FOLVITE) 716 MCG tablet Take 800 mcg by mouth daily.   . furosemide (LASIX) 80 MG tablet Take 1 tablet (80 mg total) by mouth 2 (two) times daily at 10 AM and 5 PM.   . glimepiride (AMARYL) 4 MG tablet Take 4 mg by mouth daily with breakfast.    . hydrALAZINE (APRESOLINE) 50 MG tablet TAKE 1 TABLET BY MOUTH THREE TIMES A DAY   . insulin glargine (LANTUS) 100 UNIT/ML injection Inject into the skin.   . Multiple Vitamin (MULTIVITAMIN) tablet Take 1 tablet by mouth daily.    . nitroGLYCERIN (NITROSTAT) 0.4 MG SL tablet Place 1 tablet (0.4 mg total) under the tongue every 5 (five) minutes x 3 doses as needed for chest pain.   Marland Kitchen OVER THE COUNTER MEDICATION Place 1 spray into the nose daily as needed. Sovereign Silver Nasal Spray for Immune Support   . OVER THE COUNTER MEDICATION Take 1 capsule by mouth every morning.   . potassium chloride (KLOR-CON) 10 MEQ tablet Take 1 tablet (10 mEq total) by mouth daily.   . TRADJENTA 5 MG TABS tablet Take 5 mg by mouth every morning.    . traZODone (DESYREL) 50 MG tablet Take 50 mg by mouth at bedtime as needed for sleep.     No facility-administered encounter medications on file as of 01/09/2020.    Functional Status:  In your present state of health, do you have any difficulty performing the following activities: 06/02/2019 04/19/2019  Hearing? N N  Vision? N N  Difficulty concentrating or making decisions? N N  Walking or climbing stairs? N N  Comment - -  Dressing or bathing? N N  Doing errands, shopping? N N  Preparing Food and eating ? N -  Using the Toilet? N -  In the past six months, have you accidently leaked urine? N -  Do you have problems with loss of bowel control? N -  Managing your Medications? N -  Managing your Finances? N -  Housekeeping or managing your Housekeeping? N -  Comment - -  Some recent data might  be hidden    Fall/Depression Screening: Fall Risk  01/09/2020 10/31/2019 06/02/2019  Falls in the past year? 0 0 0  Number falls in past yr: 0 0 -  Injury with Fall? 0 0 -  Risk for fall due to : Impaired balance/gait;Impaired mobility Impaired mobility;Impaired balance/gait -  Risk for fall due to: Comment dizziness at times - -  Follow up Falls prevention discussed;Education provided;Falls evaluation completed Falls prevention discussed;Education provided;Falls evaluation completed -   PHQ 2/9 Scores 10/31/2019 06/02/2019 02/10/2019 11/11/2018 08/13/2018 04/16/2018 10/01/2017  PHQ - 2 Score 0 0 0 0 0 0 0    Assessment:  Goals Addressed            This Visit's Progress   . Patient will not have a hospitalization related to HF symptoms over the next 90 days.   On track    [place] (see longtitudinal plan of care for additional care plan information)   Current Barriers:  Marland Kitchen Knowledge deficit related to basic heart failure pathophysiology and self care management  Case Manager Clinical Goal(s):  Marland Kitchen Over the next 90 days, patient will verbalize understanding of Heart Failure Action Plan and when to call doctor . Over the next 90 days, patient will take all Heart Failure mediations as prescribed . Over the next 90 days, patient will weigh daily and record (notifying MD of 3 lb weight gain over night or 5 lb in a week) . Patient will verbalize two symptoms of CHF exacerbation within the next 90 days.   . Patient will verbalize receiving annual flu vaccine within the next 90 days.  . Patient will continue to walk 4-5 times weekly.  Interventions:  . Provided verbal education on low sodium diet . Reviewed Heart Failure Action Plan in depth and provided written copy . Assessed need for readable accurate scales in home . Provided education about placing scale on hard, flat surface . Advised patient to weigh each morning after emptying bladder . Discussed importance of daily weight and  advised patient to weigh and record daily  Patient Self Care Activities:  . Takes Heart Failure Medications as prescribed . Weighs daily and record (notifying MD of 3 lb weight gain over night or 5 lb in a week) . Verbalizes understanding of and follows CHF Action Plan . Adheres to low sodium diet . Patient walks routinely 4-5 times weekly  Please see past updates related to this goal by clicking on the "Past Updates" button in the selected goal  Updated: 01/09/20     . Track and Manage Fluids and Swelling       Follow Up Date 03/13/20    - call office if I gain more than 2 pounds in one day or 5 pounds in one  week - keep legs up while sitting - track weight in diary - use salt in moderation - watch for swelling in feet, ankles and legs every day - weigh myself daily    Why is this important?   It is important to check your weight daily and watch how much salt and liquids you have.  It will help you to manage your heart failure.    Notes: Wife states that the patient is weighing himself, taking his B/P, and recording the values daily. Nurse encouraged bringing weight and B/P daily recordings to provider appointments.     . Track and Manage Symptoms       Follow Up Date 03/13/20    - begin a heart failure diary - bring diary to all appointments - develop a rescue plan - eat more whole grains, fruits and vegetables, lean meats and healthy fats - follow rescue plan if symptoms flare-up - know when to call the doctor - track symptoms and what helps feel better or worse    Why is this important?   You will be able to handle your symptoms better if you keep track of them.  Making some simple changes to your lifestyle will help.  Eating healthy is one thing you can do to take good care of yourself.    Notes: Discussed CHF zones and action plans with wife [name]. Nurse will send patient CHF, hypertension, and planning healthy meals packet.       Plan: RN Health Coach will send  patient/wife CHF, hypertension, and meal planning education,will call patient within the month of January, and patient/wife agrees to future outreach calls.   Emelia Loron RN, BSN [place] [phone] [email or web address]

## 2020-01-09 NOTE — Patient Instructions (Signed)
Medication Instructions:  DECREASE hydralazine to 50mg  twice daily  *If you need a refill on your cardiac medications before your next appointment, please call your pharmacy*   Lab Work: NONE If you have labs (blood work) drawn today and your tests are completely normal, you will receive your results only by: Marland Kitchen MyChart Message (if you have MyChart) OR . A paper copy in the mail If you have any lab test that is abnormal or we need to change your treatment, we will call you to review the results.   Testing/Procedures: ECHO to be scheduled now per Dr. Debara Pickett  [address]   Follow-Up: At [facility], you and your health needs are our priority.  As part of our continuing mission to provide you with exceptional heart care, we have created designated Provider Care Teams.  These Care Teams include your primary Cardiologist (physician) and Advanced Practice Providers (APPs -  Physician Assistants and Nurse Practitioners) who all work together to provide you with the care you need, when you need it.  We recommend signing up for the patient portal called "MyChart".  Sign up information is provided on this After Visit Summary.  MyChart is used to connect with patients for Virtual Visits (Telemedicine).  Patients are able to view lab/test results, encounter notes, upcoming appointments, etc.  Non-urgent messages can be sent to your provider as well.   To learn more about what you can do with MyChart, go to [email or web address].    Your next appointment:   3 month(s)  The format for your next appointment:   In Person  Provider:   You may see Pixie Casino, MD or one of the following Advanced Practice Providers on your designated Care Team:    Almyra Deforest, PA-C  Fabian Sharp, PA-C or   Roby Lofts, [place]    Other Instructions

## 2020-01-09 NOTE — Patient Instructions (Signed)
Goals Addressed            This Visit's Progress   . Patient will not have a hospitalization related to HF symptoms over the next 90 days.   On track    [place] (see longtitudinal plan of care for additional care plan information)   Current Barriers:  Marland Kitchen Knowledge deficit related to basic heart failure pathophysiology and self care management  Case Manager Clinical Goal(s):  Marland Kitchen Over the next 90 days, patient will verbalize understanding of Heart Failure Action Plan and when to call doctor . Over the next 90 days, patient will take all Heart Failure mediations as prescribed . Over the next 90 days, patient will weigh daily and record (notifying MD of 3 lb weight gain over night or 5 lb in a week) . Patient will verbalize two symptoms of CHF exacerbation within the next 90 days.   . Patient will verbalize receiving annual flu vaccine within the next 90 days.  . Patient will continue to walk 4-5 times weekly.  Interventions:  . Provided verbal education on low sodium diet . Reviewed Heart Failure Action Plan in depth and provided written copy . Assessed need for readable accurate scales in home . Provided education about placing scale on hard, flat surface . Advised patient to weigh each morning after emptying bladder . Discussed importance of daily weight and advised patient to weigh and record daily  Patient Self Care Activities:  . Takes Heart Failure Medications as prescribed . Weighs daily and record (notifying MD of 3 lb weight gain over night or 5 lb in a week) . Verbalizes understanding of and follows CHF Action Plan . Adheres to low sodium diet . Patient walks routinely 4-5 times weekly  Please see past updates related to this goal by clicking on the "Past Updates" button in the selected goal  Updated: 01/09/20     . Track and Manage Fluids and Swelling       Follow Up Date 03/13/20    - call office if I gain more than 2 pounds in one day or 5 pounds in one  week - keep legs up while sitting - track weight in diary - use salt in moderation - watch for swelling in feet, ankles and legs every day - weigh myself daily    Why is this important?   It is important to check your weight daily and watch how much salt and liquids you have.  It will help you to manage your heart failure.    Notes: Wife states that the patient is weighing himself, taking his B/P, and recording the values daily. Nurse encouraged bringing weight and B/P daily recordings to provider appointments.     . Track and Manage Symptoms       Follow Up Date 03/13/20    - begin a heart failure diary - bring diary to all appointments - develop a rescue plan - eat more whole grains, fruits and vegetables, lean meats and healthy fats - follow rescue plan if symptoms flare-up - know when to call the doctor - track symptoms and what helps feel better or worse    Why is this important?   You will be able to handle your symptoms better if you keep track of them.  Making some simple changes to your lifestyle will help.  Eating healthy is one thing you can do to take good care of yourself.    Notes: Discussed CHF zones and action plans  with wife [name]. Nurse will send patient CHF, hypertension, and planning healthy meals packet.

## 2020-01-09 NOTE — Progress Notes (Signed)
OFFICE NOTE   Chief Complaint:  Follow-up  Primary Care Physician: Jani Gravel, MD  HPI:  [name] is a 86 year old with a history of coronary artery disease status post CABG in 1987, hypertension, dyslipidemia, diabetes, and peripheral arterial disease which is mild. Over the past year he has done well from a cardiac standpoint. He does have a history of prostate cancer and underwent radiation seed implant for that. He has also been having problems with confusion and brain fog and his medications were adjusted, including holding his statin, which did not cause much improvement. This may be some early dementia. In addition, he has abnormal lower extremity Dopplers, which I repeated. This has actually shown mild improvement in his ABIs with ABI 1.1 on the right and ABI of 0.92 on the left. The bilateral posterior tibials were occluded and there was 50% to 69% reduction in left common iliac and about a 0 to 49% reduction in the right superficial femoral. Despite this, he has no symptoms of claudication. He also denies any chest pain. He has had some shortness of breath and this, however, seems to be related to this episode over the past several months where he reported initially he was thought stung by a bee, developed swelling on the left side of his face and then subsequently developed extreme weakness and there as concern of a rheumatoid arthritis. His symptoms have improved with a steroid burst and tapered. He is currently on prednisone as well as methotrexate. He has also had recent dizziness. He reports head and sinus congestion, which may be contributing to his symptoms. However some of the dizziness is associated with walking up a hill at the end of exercise or walking down hills particularly. Is not associated with change in position or quick head movements.  There is some associated fatigue and chest fullness with exercise and improves with rest, reminiscent of possible angina.  He  underwent bilateral carotid Dopplers which showed a mild amount of plaque. A nuclear stress test performed which demonstrated no reversible ischemia and preserved ejection fraction. He still reports he has some mild dizziness and does fatigue a little at the beginning of exercise but improves when he continues to exercise.  He is generally without complaints today. He reports that recently blood work indicated that his kidney function was worsening and that he is now scheduled to see a nephrologist next Monday. I do not yet have those results in front of me. His metformin was discontinued due to this but he remains on lisinopril and that may need to be adjusted. He denies any specific anginal symptoms. He has some very mild claudication symptoms but it improves with walking. He continues to have a systolic murmur best heard over the aortic area and does have a history of aortic sclerosis in the past.  [name] returns today for follow-up. Unfortunately says he hasn't taken his medicine today. He is supposed to take hydralazine 100 mg 3 times a day and for some reason has not taken 2 doses this morning. Blood pressure was very high at 196/86 but he denied any chest pain or headache. He says he going to take his medicine when he gets home. Is also on Flomax but no other blood pressure medicines. This is probably due to worsening renal function. Currently his creatinine is around 3 and is followed by Dr. Mercy Moore. Is also on Lipitor and cholesterol was recently checked by Dr. Wilson Singer.  10/07/2015  [name] returns  today for follow-up. Over the past year he's had no new complaints although his blood pressures been difficult to control. He's followed by Dr. Mercy Moore. He was previously on amlodipine but that's been discontinued. He is now on hydralazine, clonidine and valsartan. He did not take his medicines today and his blood pressure is elevated. He says he takes the clonidine once or twice per day. He  tries to void in the morning because it causes extreme fatigue. We had previously discussed the Catapres patch however cost was limiting. He also reports recently some worsening memory.  10/16/2016  [name] returns for follow-up. Many years since I last saw him. He's called in for some chest pain which she said got better. His last stress test was in 2016 which was negative for ischemia. He was noted to have some aortic sclerosis in 2015 however last year was noted to have a louder systolic murmur which sounds more like aortic stenosis. Blood pressures not been well controlled. Today's elevated again 160/70. He says his blood pressure medicines make him feel tired. He told me that he takes all for his blood pressure medicines at night. When asked further about his hydralazine which is supposed to be taken 3 times a day he says he does not do that. He then reported that his blood pressure typically runs higher in the morning and throughout the day. He currently denies any chest pain. He reports some left lower strandy swelling but also has numbness and tingling has been undergoing and back injections.  10/27/2016  [name] had adjustments on his medications. He is now taking a hydralazine 3 times a day, irbesartan in the morning and clonidine at night. I reviewed his blood pressures which tend to range in the 140s in the morning before taking medication, in the 120s in the afternoon after medication and typically in the 120s over 80s at night. This indicates adequate treatment of his blood pressure. He did undergo an echocardiogram for aortic stenosis. This demonstrated a normal LVEF of 60-65% with mild aortic stenosis and a mean gradient of 11 mmHg. He understands the implications of this and that we will continue to follow his aortic valve disease.  02/28/2017  [name] presents today with significant shortness of breath, weight gain and leg edema as well as increasing abdominal girth over the  past 2-3 weeks.  The symptoms came on gradually but clinically worsened.  Weight in August was 185 pounds, he now weighs 204 pounds.  Reports worsening shortness of breath, orthopnea and PND.  Blood pressure was also elevated today 183/75 and recently has been difficult to control.  His primary care provider recommended increasing his clonidine to 0.1 mg 3 times daily, however he has had significant daytime fatigue with this.  03/16/2017  [name] returns today for follow-up of heart failure.  His weight back in August was 185 pounds and he gained up to 204 pounds.  When I last saw him I doubled his diuretic and increased his blood pressure medication.  Over the past 2 weeks he has diuresed back down to 182 pounds.  He feels markedly better.  Blood pressure is better controlled today 132/58.  He feels like his energy level has improved.  We obtain blood work when he was in congestive heart failure which showed an elevated creatinine over 2 and a baseline around 1.6.  He was hypokalemic and I increased his potassium.  His echo showed an EF of 65-70% with grade 2 diastolic dysfunction  and high LV filling pressures.  06/19/2017  [name] was seen today in follow-up.  I last saw him in January, at which time he was complaining of shortness of breath and edema.  Weight was up to 204 pounds.  He was given diuretics and his weight has come down significantly.  In fact, his creatinine rose up to 2.7 and weight was down to 182.  He saw his nephrologist, Dr. Hollie Salk, recently who decreased his Lasix further and started amlodipine 10 mg daily.  He now says over the past several months he has had progressive fatigue, decreased exercise tolerance and worsening shortness of breath.  He wonders if it is related to his aortic valve.  In January was noted to have normal systolic function and mild aortic stenosis.  On exam, he has what sounds like moderate left ear with an audible second heart sound.  He again looks  volume overloaded today and weight is back up to 193 pounds, up from 182 pounds.  He denies any chest pain.  01/02/2018  [name] returns today for follow-up of his heart failure.  Recently he has been seen by nephrology.  He felt that he is renal failure has been fairly stable.  Plans were to possibly resume ARB therapy.  His aortic stenosis is mild to moderate and followed by [place].  His EKG shows sinus rhythm at 65.  He denies any worsening shortness of breath.  Weight has been stable.  I had referred him for heart cath in April 2019 which showed two-vessel coronary disease with 3 out of 3 patent bypass grafts.  Medical therapy was recommended however it was felt to be volume overloaded.  Subsequently he was admitted with heart failure and diuresis.  His weight now is down significantly to 170 pounds from 182 most recently.  He seems to be feeling much better.  08/06/2019  [name] was again admitted for heart failure in February of this past year.  Ultimately he had progression of renal failure with hypokalemia and congestive heart failure and transition to peritoneal dialysis.  Since then he is done much better and is fairly stable.  Weight is come down to about 155 although measured 160 in our office today.  Blood pressure has been well controlled at home.  He denies any palpitations or chest pain.  He is noted to have a history of aortic stenosis which sounds mild to moderate on physical exam.  EKG personally reviewed today shows sinus rhythm.  01/09/2020  [name] returns today for follow-up.  More recently he has noted increasing fatigue and shortness of breath.  He had an echo earlier in the year which showed mild to moderate aortic stenosis however by exam it appeared to be more moderate to severe in my opinion.  We were going to schedule repeat echo earlier next year however I think he should have it sooner.  He denies any anginal symptoms.  Blood pressure is well controlled today in  fact at times he is log indicates his blood pressure may get a little too low.  This could be contributing to some symptoms of dizziness that he is reporting.  EKG today shows a sinus bradycardia with voltage criteria for LVH at 54.  PMHx:  Past Medical History:  Diagnosis Date  . Arthritis    "a little in LLE" (06/20/2017)  . Chronic diastolic CHF (congestive heart failure) ([place])   . CKD (chronic kidney disease), stage IV ([place])   . Coronary artery  disease    a. s/p CABG 1980s. b. stable by cath 05/2017.  Marland Kitchen Dyslipidemia   . Edema 08/08/2017   HANDS & LOWER EXTREMITES  . Hypertension   . Hypoalbuminemia   . Nephrotic range proteinuria   . Peripheral artery disease ([facility])    mild  . Pneumonia ~ 1950 X 1  . Prostate cancer ([place]) ~ 2015  . Type II diabetes mellitus ([place])     Past Surgical History:  Procedure Laterality Date  . CARDIAC CATHETERIZATION     "a couple times" (06/20/2017)  . CATARACT EXTRACTION W/ INTRAOCULAR LENS  IMPLANT, BILATERAL Bilateral   . CORONARY ARTERY BYPASS GRAFT  03/06/1985   LAD and first diagonal/circumflex (sequential saphenous vein graft,cardioplegia  . INSERTION PROSTATE RADIATION SEED  ~ 2015  . NM MYOCAR PERF WALL MOTION  09/12/2006   no ischemia  . RIGHT/LEFT HEART CATH AND CORONARY/GRAFT ANGIOGRAPHY N/A 06/21/2017   Procedure: RIGHT/LEFT HEART CATH AND CORONARY/GRAFT ANGIOGRAPHY;  Surgeon: Burnell Blanks, MD;  Location: [facility];  Service: Cardiovascular;  Laterality: N/A;  . US ECHOCARDIOGRAPHY  11/04/2008   trace TR & MR    FAMHx:  Family History  Problem Relation Age of Onset  . Heart attack Mother     SOCHx:   reports that he quit smoking about 47 years ago. His smoking use included cigarettes. He has a 20.00 pack-year smoking history. He has never used smokeless tobacco. He reports previous alcohol use. He reports that he does not use drugs.  ALLERGIES:  No Known Allergies  ROS: Pertinent items noted in HPI and  remainder of comprehensive ROS otherwise negative.  HOME MEDS: Current Outpatient Medications  Medication Sig Dispense Refill  . amLODipine (NORVASC) 10 MG tablet Take 10 mg by mouth at bedtime.    Marland Kitchen aspirin EC 81 MG tablet Take 81 mg by mouth daily.    Marland Kitchen atorvastatin (LIPITOR) 80 MG tablet Take 0.5 tablets (40 mg total) by mouth at bedtime. 30 tablet 6  . carboxymethylcellulose (REFRESH PLUS) 0.5 % SOLN Place 1 drop into both eyes 3 (three) times daily as needed (dry, itch).    . carvedilol (COREG) 3.125 MG tablet TAKE 1 TABLET (3.125 MG TOTAL) BY MOUTH 2 (TWO) TIMES DAILY WITH A MEAL. 180 tablet 3  . Cholecalciferol 25 MCG (1000 UT) tablet Take by mouth.    . folic acid (FOLVITE) 676 MCG tablet Take 800 mcg by mouth daily.    . furosemide (LASIX) 80 MG tablet Take 1 tablet (80 mg total) by mouth 2 (two) times daily at 10 AM and 5 PM. 180 tablet 3  . glimepiride (AMARYL) 4 MG tablet Take 4 mg by mouth daily with breakfast.     . hydrALAZINE (APRESOLINE) 50 MG tablet TAKE 1 TABLET BY MOUTH THREE TIMES A DAY 270 tablet 2  . insulin glargine (LANTUS) 100 UNIT/ML injection Inject into the skin.    . Multiple Vitamin (MULTIVITAMIN) tablet Take 1 tablet by mouth daily.     . nitroGLYCERIN (NITROSTAT) 0.4 MG SL tablet Place 1 tablet (0.4 mg total) under the tongue every 5 (five) minutes x 3 doses as needed for chest pain. 25 tablet 4  . OVER THE COUNTER MEDICATION Place 1 spray into the nose daily as needed. Sovereign Silver Nasal Spray for Immune Support    . OVER THE COUNTER MEDICATION Take 1 capsule by mouth every morning. Pt takes as neeed.    . potassium chloride (KLOR-CON) 10 MEQ tablet Take  1 tablet (10 mEq total) by mouth daily. 90 tablet 2  . TRADJENTA 5 MG TABS tablet Take 5 mg by mouth every morning.     . traZODone (DESYREL) 50 MG tablet Take 50 mg by mouth at bedtime as needed for sleep.   12  . acetaminophen (TYLENOL) 650 MG CR tablet Take 650 mg by mouth at bedtime. (Patient not  taking: Reported on 01/09/2020)     No current facility-administered medications for this visit.    LABS/IMAGING: No results found for this or any previous visit (from the past 48 hour(s)). No results found.  VITALS: BP 120/60 (BP Location: Left Arm, Patient Position: Sitting)   Pulse (!) 55   Ht 5\' 11"  (1.803 m)   Wt 171 lb 12.8 oz (77.9 kg)   SpO2 98%   BMI 23.96 kg/m   EXAM: General appearance: alert, no distress and moderately obese Neck: no carotid bruit, no JVD and thyroid not enlarged, symmetric, no tenderness/mass/nodules Lungs: clear to auscultation bilaterally Heart: regular rate and rhythm, S1, S2 normal and systolic murmur: systolic ejection 3/6, crescendo at 2nd right intercostal space Abdomen: soft, non-tender; bowel sounds normal; no masses,  no organomegaly Extremities: extremities normal, atraumatic, no cyanosis or edema Pulses: 2+ and symmetric Skin: Skin color, texture, turgor normal. No rashes or lesions Neurologic: Grossly normal Psych: Pleasant  EKG: Sinus bradycardia 55, minimal voltage criteria for LVH-personally reviewed  ASSESSMENT: 1.   Chronic diastolic congestive heart failure 1. ESRD on PD 2. Moderate to severe aortic stenosis 3. Coronary artery disease status post CABG in 1987 -patent bypass grafts by cath (05/2017) 4. Hypertension 5. Hyperlipidemia 6. Diabetes type 2 7. Mild PAD - intermittent claudication, not lifestyle limiting 8. Possible rheumatoid arthritis  PLAN: 1.   [name] has recently been more fatigued and is having some episodes of dizziness, lightheadedness and other symptoms concerning for either hypotension or decreased cardiac output.  His echo had demonstrated mild to moderate aortic stenosis however his physical exam is consistent with more moderate to severe AS with a soft second heart sound that I believe is still present.  I would like to repeat his echo earlier this month to see if his left ear has progressed.   This might explain his symptoms.  In addition he is having some low to low normal blood pressures.  I would recommend decreasing his hydralazine from 50 mg 3 times daily to 50 mg twice daily.  Plan follow-up with me afterwards.  Pixie Casino, MD, [facility], [place] Director of the [facility]  [facility] Attending Cardiologist  Direct Dial: [phone]  Fax: [phone]  Website:  [email or web address] 01/09/2020, 2:33 PM

## 2020-01-17 ENCOUNTER — Other Ambulatory Visit: Payer: Self-pay | Admitting: Cardiology

## 2020-01-19 NOTE — Telephone Encounter (Signed)
rx refill

## 2020-01-23 ENCOUNTER — Ambulatory Visit: Payer: Medicare Other | Admitting: *Deleted

## 2020-02-09 ENCOUNTER — Ambulatory Visit (HOSPITAL_COMMUNITY): Payer: Medicare Other | Attending: Cardiology

## 2020-02-09 ENCOUNTER — Other Ambulatory Visit: Payer: Self-pay

## 2020-02-09 DIAGNOSIS — I350 Nonrheumatic aortic (valve) stenosis: Secondary | ICD-10-CM

## 2020-02-09 LAB — ECHOCARDIOGRAM COMPLETE
AR max vel: 1.09 cm2
AV Area VTI: 1.17 cm2
AV Area mean vel: 0.98 cm2
AV Mean grad: 19 mmHg
AV Peak grad: 35.7 mmHg
Ao pk vel: 2.99 m/s
Area-P 1/2: 2.13 cm2
S' Lateral: 2.9 cm

## 2020-02-25 ENCOUNTER — Other Ambulatory Visit: Payer: Self-pay | Admitting: Internal Medicine

## 2020-02-27 DIAGNOSIS — I350 Nonrheumatic aortic (valve) stenosis: Secondary | ICD-10-CM

## 2020-02-27 HISTORY — DX: Nonrheumatic aortic (valve) stenosis: I35.0

## 2020-03-02 ENCOUNTER — Other Ambulatory Visit: Payer: Self-pay | Admitting: *Deleted

## 2020-03-02 NOTE — Patient Outreach (Signed)
[facility]) Care Management  03/02/2020  [name] 1933/05/31 [id number]  Unsuccessful outreach attempt made to patient' wife [name]. [name] answered the phone and states that she could not speak today. She did request that this nurse call back at a later date.   Plan: RN Health Coach will call patient within the month of February.   Emelia Loron RN, BSN [place] [phone] [email or web address]

## 2020-03-27 ENCOUNTER — Other Ambulatory Visit: Payer: Self-pay | Admitting: Medical

## 2020-03-29 NOTE — Telephone Encounter (Signed)
This is Dr. Hilty's pt 

## 2020-03-31 NOTE — Progress Notes (Signed)
Cardiology Office Note:    Date:  04/13/2020   ID:  [name], DOB Aug 06, 1933, MRN [id number]  PCP:  Nathaniel Gravel, MD  Cardiologist:  Nathaniel Casino, MD   Referring MD: Nathaniel Gravel, MD   Chief Complaint  Patient presents with  . Follow-up  CAD  History of Present Illness:    [name] is a 87 y.o. male with a hx of CAD s/p CABG in 1987, [place], HLD, DM, mild carotid artery stenosis, AS, and PAD. He has a history of prostate cancer, suspected early dementia, RA, and CKD followed by nephrology and now on PD.  He has a pattern of not taking medication prior to appointments. Nonischemic nuclear stress test in 2016. He has significant daytime fatigue when he takes clonidine in the morning. Starting in 2018, he has struggled with volume management with hospitalizations for CHF exacerbation. Echo with preserved EF and mild to moderate AS. Right and left heart cath 05/2017 showed native disease with 3/3 patent grafts. Aortic stenosis was mild at that time. He has PAD with mild improvement in his last ABIs. Crestor stopped for "brain fog" without significant improvement. Concern for RA and placed on prednisone. Bilateral carotid dopplers with mild nonobstructive plaque. He follows with nephrology and has transitioned to peritoneal dialysis.    At his last visit 12/2019, he reported episodes of dizziness concerning for hypotension vs reduced cardiac output. Exam consistent with moderate to severe AS. Repeat echo 02/09/20 showed normal EF, mild diastolic dysfunction, mild to moderate AS, and improved pulmonary pressures.   He presents today for 3 month follow up. He reports continued dyspnea with activity - is slightly worse than normal, but unclear. He also reports cool lower extremities and tingling. He reports walking an hour inside his house every day but can't do much more than that. He also reports dizziness that sounds orthostatic - may be better since last med changes.   Overall, interview  difficult - mild dementia, here alone.   Past Medical History:  Diagnosis Date  . Arthritis    "a little in LLE" (06/20/2017)  . Chronic diastolic CHF (congestive heart failure) ([place])   . CKD (chronic kidney disease), stage IV ([place])   . Coronary artery disease    a. s/p CABG 1980s. b. stable by cath 05/2017.  Marland Kitchen Dyslipidemia   . Edema 08/08/2017   HANDS & LOWER EXTREMITES  . Hypertension   . Hypoalbuminemia   . Nephrotic range proteinuria   . Peripheral artery disease (HCC)    mild  . Pneumonia ~ 1950 X 1  . Prostate cancer ([place]) ~ 2015  . Type II diabetes mellitus ([place])     Past Surgical History:  Procedure Laterality Date  . CARDIAC CATHETERIZATION     "a couple times" (06/20/2017)  . CATARACT EXTRACTION W/ INTRAOCULAR LENS  IMPLANT, BILATERAL Bilateral   . CORONARY ARTERY BYPASS GRAFT  03/06/1985   LAD and first diagonal/circumflex (sequential saphenous vein graft,cardioplegia  . INSERTION PROSTATE RADIATION SEED  ~ 2015  . NM MYOCAR PERF WALL MOTION  09/12/2006   no ischemia  . RIGHT/LEFT HEART CATH AND CORONARY/GRAFT ANGIOGRAPHY N/A 06/21/2017   Procedure: RIGHT/LEFT HEART CATH AND CORONARY/GRAFT ANGIOGRAPHY;  Surgeon: Burnell Blanks, MD;  Location: [facility];  Service: Cardiovascular;  Laterality: N/A;  . US ECHOCARDIOGRAPHY  11/04/2008   trace TR & MR    Current Medications: Current Meds  Medication Sig  . acetaminophen (TYLENOL) 650 MG CR tablet Take  650 mg by mouth at bedtime.  Marland Kitchen amLODipine (NORVASC) 10 MG tablet Take 10 mg by mouth at bedtime.  Marland Kitchen aspirin EC 81 MG tablet Take 81 mg by mouth daily.  Marland Kitchen atorvastatin (LIPITOR) 80 MG tablet TAKE 0.5 TABLETS (40 MG TOTAL) BY MOUTH AT BEDTIME.  . carboxymethylcellulose (REFRESH PLUS) 0.5 % SOLN Place 1 drop into both eyes 3 (three) times daily as needed (dry, itch).  . carvedilol (COREG) 3.125 MG tablet TAKE 1 TABLET (3.125 MG TOTAL) BY MOUTH 2 (TWO) TIMES DAILY WITH A MEAL.  Marland Kitchen Cholecalciferol 25 MCG (1000  UT) tablet Take by mouth.  . folic acid (FOLVITE) 160 MCG tablet Take 800 mcg by mouth daily.  . furosemide (LASIX) 80 MG tablet Take 1 tablet (80 mg total) by mouth 2 (two) times daily at 10 AM and 5 PM.  . glimepiride (AMARYL) 4 MG tablet Take 4 mg by mouth daily with breakfast.   . hydrALAZINE (APRESOLINE) 50 MG tablet Take 1 tablet (50 mg total) by mouth in the morning and at bedtime.  . insulin glargine (LANTUS) 100 UNIT/ML injection Inject into the skin.  Marland Kitchen KLOR-CON M10 10 MEQ tablet TAKE 1 TABLET BY MOUTH EVERY DAY  . Multiple Vitamin (MULTIVITAMIN) tablet Take 1 tablet by mouth daily.   . nitroGLYCERIN (NITROSTAT) 0.4 MG SL tablet Place 1 tablet (0.4 mg total) under the tongue every 5 (five) minutes x 3 doses as needed for chest pain.  Marland Kitchen OVER THE COUNTER MEDICATION Place 1 spray into the nose daily as needed. Sovereign Silver Nasal Spray for Immune Support  . OVER THE COUNTER MEDICATION Take 1 capsule by mouth every morning. Pt takes as neeed.  . TRADJENTA 5 MG TABS tablet Take 5 mg by mouth every morning.   . traZODone (DESYREL) 50 MG tablet Take 50 mg by mouth at bedtime as needed for sleep.      Allergies:   Patient has no known allergies.   Social History   Socioeconomic History  . Marital status: Married    Spouse name: [name]  . Number of children: Not on file  . Years of education: Not on file  . Highest education level: Not on file  Occupational History  . Occupation: Retired  Tobacco Use  . Smoking status: Former Smoker    Packs/day: 1.00    Years: 20.00    Pack years: 20.00    Types: Cigarettes    Quit date: 08/22/1972    Years since quitting: 47.6  . Smokeless tobacco: Never Used  Vaping Use  . Vaping Use: Never used  Substance and Sexual Activity  . Alcohol use: Not Currently    Comment: 06/20/2017 "nothing since ~ 1974"  . Drug use: No  . Sexual activity: Not Currently  Other Topics Concern  . Not on file  Social History Narrative  . Not on file    Social Determinants of Health   Financial Resource Strain: Not on file  Food Insecurity: No Food Insecurity  . Worried About Charity fundraiser in the Last Year: Never true  . Ran Out of Food in the Last Year: Never true  Transportation Needs: No Transportation Needs  . Lack of Transportation (Medical): No  . Lack of Transportation (Non-Medical): No  Physical Activity: Sufficiently Active  . Days of Exercise per Week: 7 days  . Minutes of Exercise per Session: 40 min  Stress: Not on file  Social Connections: Not on file     Family History: The  patient's family history includes Heart attack in his mother.  ROS:   Please see the history of present illness.     All other systems reviewed and are negative.  EKGs/Labs/Other Studies Reviewed:    The following studies were reviewed today:  Echo 02/09/20: 1. Left ventricular ejection fraction, by estimation, is 60 to 65%. Left  ventricular ejection fraction by 3D volume is 65 %. The left ventricle has  normal function. The left ventricle has no regional wall motion  abnormalities. Left ventricular diastolic  parameters are consistent with Grade I diastolic dysfunction (impaired  relaxation).  2. Right ventricular systolic function is normal. The right ventricular  size is mildly enlarged. There is normal pulmonary artery systolic  pressure. The estimated right ventricular systolic pressure is 55.7 mmHg.  3. Left atrial size was mildly dilated.  4. Right atrial size was mildly dilated.  5. The mitral valve is normal in structure. No evidence of mitral valve  regurgitation. No evidence of mitral stenosis.  6. The aortic valve is calcified. There is moderate calcification of the  aortic valve. There is moderate thickening of the aortic valve. Aortic  valve regurgitation is not visualized. Mild to moderate aortic valve  stenosis. Aortic valve area, by VTI  measures 1.17 cm. Aortic valve mean gradient measures 19.0 mmHg.  Aortic  valve Vmax measures 2.99 m/s.  7. The inferior vena cava is normal in size with greater than 50%  respiratory variability, suggesting right atrial pressure of 3 mmHg.    Right and left heart cath 06/21/17:  Prox RCA to Mid RCA lesion is 30% stenosed.  Mid RCA lesion is 40% stenosed.  Dist RCA lesion is 30% stenosed.  Ost LAD to Prox LAD lesion is 100% stenosed.  LIMA graft was visualized by angiography and is normal in caliber.  Ost Cx to Prox Cx lesion is 90% stenosed.  Hemodynamic findings consistent with mild pulmonary hypertension.  LV end diastolic pressure is normal.   1. Severe double vessel CAD s/p 3V CABG with 3/3 patent bypass grafts  2. The LAD has 100% proximal occlusion. The entire LAD fills from the patent LIMA graft. The Diagonal branch fills from the vein graft 3. The Circumflex has 90% proximal stenosis. The vein graft fills the large obtuse marginal graft.  4. The RCA is a large dominant vessel with mild diffuse calcific plaque in the proximal, mid and distal vessel but no obstructive lesions.  5. The LIMA to the LAD is patent 6 The sequential saphenous vein graft to the Diagonal and OM is patent with mild filling defects seen in the proximal, mid and distal segments of the body of the graft. These lesions do not appear to be flow limiting.  7. Mild aortic stenosis. Peak to peak gradient 4 mmHg. The valve was easily crossed.  8. Right and left heart filling pressures outline above.     EKG:  EKG is not ordered today.    Recent Labs: 04/18/2019: B Natriuretic Peptide 1,406.6 04/25/2019: BUN 49; Creatinine, Ser 3.89; Hemoglobin 10.4; Platelets 426; Potassium 3.5; Sodium 141  Recent Lipid Panel    Component Value Date/Time   CHOL 110 04/19/2019 0332   CHOL 196 03/31/2019 1436   TRIG 59 04/19/2019 0332   HDL 42 04/19/2019 0332   HDL 87 03/31/2019 1436   CHOLHDL 2.6 04/19/2019 0332   VLDL 12 04/19/2019 0332   LDLCALC 56 04/19/2019 0332   LDLCALC  96 03/31/2019 1436    Physical Exam:  VS:  BP 140/78   Tate 61   Ht 5\' 11"  (1.803 m)   Wt 170 lb (77.1 kg)   SpO2 97%   BMI 23.71 kg/m     Wt Readings from Last 3 Encounters:  04/13/20 170 lb (77.1 kg)  01/09/20 171 lb 12.8 oz (77.9 kg)  08/06/19 160 lb (72.6 kg)     GEN: elderly male in no acute distress HEENT: Normal NECK: No JVD; No carotid bruits LYMPHATICS: No lymphadenopathy CARDIAC: RRR, no murmurs, rubs, gallops RESPIRATORY:  Clear to auscultation without rales, wheezing or rhonchi  ABDOMEN: Soft, non-tender, non-distended MUSCULOSKELETAL:  No edema; No deformity - only mildly cool extremities, faint Tate SKIN: Warm and dry NEUROLOGIC:  Alert and oriented x 3 PSYCHIATRIC:  Normal affect   ASSESSMENT:    1. PAD (peripheral artery disease) ([place])   2. Coronary artery disease involving coronary bypass graft of native heart without angina pectoris   3. Essential hypertension   4. Dyslipidemia   5. ESRD needing dialysis ([place])   6. Type 2 diabetes mellitus with stage 5 chronic kidney disease not on chronic dialysis, without long-term current use of insulin ([place])   7. Aortic valve stenosis, etiology of cardiac valve disease unspecified   8. Diastolic CHF, chronic (HCC)    PLAN:    In order of problems listed above:  CAD s/p CABG 1987 - continue ASA and BB - patent grafts by cath in 2019 - no angina, has not used SL nitro - still walks 1 hr daily in the house   Hypertension - 10 mg amlodipine, 3.125 mg coreg BID, 50 mg hydralazine BID - BP stable today - still with symptoms of orthostasis, no recent falls - no med changes today   Hyperlipidemia with LDL goal < 70 04/19/2019: Cholesterol 110; HDL 42; LDL Cholesterol 56; Triglycerides 59; VLDL 12 - continue 80 mg lipitor   ESRD on PD - per nephrology   DM2 - glimepiride, tradjenta    Mild PAD with intermittent claudication - not lifestyle limiting - he denies claudication today, but coolness  and tingling in LE worse - will repeat arterial dopplers   AS - moderate on last echo - murmur on exam - he can still walk an hour inside his house - we discussed that when he can't walk more than 30 min, we should check his valve - no chest pain   Chronic diastolic heart failure - 80 mg lasix BID   Follow up with Dr. Debara Pickett in 3 months.   Medication Adjustments/Labs and Tests Ordered: Current medicines are reviewed at length with the patient today.  Concerns regarding medicines are outlined above.  Orders Placed This Encounter  Procedures  . VAS [place] LOWER EXTREMITY ARTERIAL DUPLEX   No orders of the defined types were placed in this encounter.   Signed, Ledora Bottcher, PA  04/13/2020 11:00 AM    [facility]

## 2020-04-12 ENCOUNTER — Other Ambulatory Visit: Payer: Self-pay | Admitting: *Deleted

## 2020-04-12 NOTE — Patient Outreach (Signed)
[facility]) Care Management  04/12/2020  [name] Mar 20, 1933 [id number]  Unsuccessful outreach attempt made to patient's wife [name]. Wife states she was getting ready to leave the house and could not speak today. She did request that this nurse call her back at a later date.   Plan: RN Health Coach will call patient within the month of March.  Emelia Loron RN, BSN [place] [phone] [email or web address]

## 2020-04-13 ENCOUNTER — Other Ambulatory Visit: Payer: Self-pay

## 2020-04-13 ENCOUNTER — Ambulatory Visit: Payer: Medicare Other | Admitting: Physician Assistant

## 2020-04-13 ENCOUNTER — Encounter: Payer: Self-pay | Admitting: Physician Assistant

## 2020-04-13 VITALS — BP 140/78 | HR 61 | Ht 71.0 in | Wt 170.0 lb

## 2020-04-13 DIAGNOSIS — I739 Peripheral vascular disease, unspecified: Secondary | ICD-10-CM

## 2020-04-13 DIAGNOSIS — I350 Nonrheumatic aortic (valve) stenosis: Secondary | ICD-10-CM

## 2020-04-13 DIAGNOSIS — E785 Hyperlipidemia, unspecified: Secondary | ICD-10-CM

## 2020-04-13 DIAGNOSIS — E1122 Type 2 diabetes mellitus with diabetic chronic kidney disease: Secondary | ICD-10-CM

## 2020-04-13 DIAGNOSIS — N185 Chronic kidney disease, stage 5: Secondary | ICD-10-CM

## 2020-04-13 DIAGNOSIS — I25810 Atherosclerosis of coronary artery bypass graft(s) without angina pectoris: Secondary | ICD-10-CM | POA: Diagnosis not present

## 2020-04-13 DIAGNOSIS — Z992 Dependence on renal dialysis: Secondary | ICD-10-CM

## 2020-04-13 DIAGNOSIS — I5032 Chronic diastolic (congestive) heart failure: Secondary | ICD-10-CM

## 2020-04-13 DIAGNOSIS — I10 Essential (primary) hypertension: Secondary | ICD-10-CM | POA: Diagnosis not present

## 2020-04-13 DIAGNOSIS — N186 End stage renal disease: Secondary | ICD-10-CM

## 2020-04-13 NOTE — Patient Instructions (Signed)
Medication Instructions:  No Changes *If you need a refill on your cardiac medications before your next appointment, please call your pharmacy*   Lab Work: No labs If you have labs (blood work) drawn today and your tests are completely normal, you will receive your results only by: Marland Kitchen MyChart Message (if you have MyChart) OR . A paper copy in the mail If you have any lab test that is abnormal or we need to change your treatment, we will call you to review the results.   Testing/Procedures: [place], [address] Your physician has requested that you have an ankle brachial index (ABI). During this test an ultrasound and blood pressure cuff are used to evaluate the arteries that supply the arms and legs with blood. Allow thirty minutes for this exam. There are no restrictions or special instructions.    Follow-Up: At [facility], you and your health needs are our priority.  As part of our continuing mission to provide you with exceptional heart care, we have created designated Provider Care Teams.  These Care Teams include your primary Cardiologist (physician) and Advanced Practice Providers (APPs -  Physician Assistants and Nurse Practitioners) who all work together to provide you with the care you need, when you need it.  We recommend signing up for the patient portal called "MyChart".  Sign up information is provided on this After Visit Summary.  MyChart is used to connect with patients for Virtual Visits (Telemedicine).  Patients are able to view lab/test results, encounter notes, upcoming appointments, etc.  Non-urgent messages can be sent to your provider as well.   To learn more about what you can do with MyChart, go to [email or web address].    Your next appointment:   3 month(s)  The format for your next appointment:   In Person  Provider:   K. Mali Hilty, MD

## 2020-04-30 ENCOUNTER — Ambulatory Visit (HOSPITAL_COMMUNITY)
Admission: RE | Admit: 2020-04-30 | Discharge: 2020-04-30 | Disposition: A | Discharge status: Home / Self Care | Payer: Medicare Other | Source: Ambulatory Visit | Attending: Cardiology | Admitting: Cardiology

## 2020-04-30 ENCOUNTER — Other Ambulatory Visit: Payer: Self-pay

## 2020-04-30 DIAGNOSIS — N186 End stage renal disease: Secondary | ICD-10-CM | POA: Insufficient documentation

## 2020-04-30 DIAGNOSIS — I350 Nonrheumatic aortic (valve) stenosis: Secondary | ICD-10-CM | POA: Insufficient documentation

## 2020-04-30 DIAGNOSIS — I739 Peripheral vascular disease, unspecified: Secondary | ICD-10-CM | POA: Insufficient documentation

## 2020-04-30 DIAGNOSIS — I5032 Chronic diastolic (congestive) heart failure: Secondary | ICD-10-CM | POA: Diagnosis present

## 2020-04-30 DIAGNOSIS — E1122 Type 2 diabetes mellitus with diabetic chronic kidney disease: Secondary | ICD-10-CM | POA: Diagnosis present

## 2020-04-30 DIAGNOSIS — E785 Hyperlipidemia, unspecified: Secondary | ICD-10-CM | POA: Diagnosis present

## 2020-04-30 DIAGNOSIS — I25810 Atherosclerosis of coronary artery bypass graft(s) without angina pectoris: Secondary | ICD-10-CM | POA: Diagnosis present

## 2020-04-30 DIAGNOSIS — I10 Essential (primary) hypertension: Secondary | ICD-10-CM | POA: Diagnosis present

## 2020-04-30 DIAGNOSIS — Z992 Dependence on renal dialysis: Secondary | ICD-10-CM | POA: Diagnosis present

## 2020-04-30 DIAGNOSIS — N185 Chronic kidney disease, stage 5: Secondary | ICD-10-CM | POA: Insufficient documentation

## 2020-05-03 ENCOUNTER — Other Ambulatory Visit: Payer: Self-pay | Admitting: *Deleted

## 2020-05-03 NOTE — Patient Outreach (Addendum)
[facility]) Care Management  05/03/2020  [name] 10-19-1933 [id number]  Unsuccessful outreach attempt made to patient/wife. RN Health Coach left HIPAA compliant voicemail message along with her contact information.  Plan: RN Health Coach will call patient/wife within the month of April.  Emelia Loron RN, BSN [place] [phone] [email or web address]

## 2020-05-07 ENCOUNTER — Other Ambulatory Visit: Payer: Self-pay

## 2020-05-07 ENCOUNTER — Ambulatory Visit: Payer: Medicare Other | Admitting: Cardiovascular Disease

## 2020-05-07 ENCOUNTER — Encounter: Payer: Self-pay | Admitting: Cardiovascular Disease

## 2020-05-07 VITALS — BP 134/68 | HR 61 | Ht 71.0 in | Wt 166.0 lb

## 2020-05-07 DIAGNOSIS — I25810 Atherosclerosis of coronary artery bypass graft(s) without angina pectoris: Secondary | ICD-10-CM | POA: Diagnosis not present

## 2020-05-07 DIAGNOSIS — E785 Hyperlipidemia, unspecified: Secondary | ICD-10-CM

## 2020-05-07 DIAGNOSIS — I10 Essential (primary) hypertension: Secondary | ICD-10-CM | POA: Diagnosis not present

## 2020-05-07 DIAGNOSIS — I739 Peripheral vascular disease, unspecified: Secondary | ICD-10-CM | POA: Diagnosis not present

## 2020-05-07 NOTE — Assessment & Plan Note (Signed)
[name] was referred to me by Fabian Sharp PA-C for PAD.  He does have multiple cardiovascular risk factors.  He has no evidence of critical limb ischemia nor does he have claudication.  He has occasional cramps in his legs.  He had Doppler studies performed 04/30/2020 revealing an ABI that was noncompressible on the right and 1.03 on the left.  He had an occluded right SFA distally and occluded tibial vessels on the left.  At this point, there is no indication for an invasive approach.

## 2020-05-07 NOTE — Progress Notes (Signed)
05/07/2020 [name]   1934-02-14  [id number]  Primary Physician Nathaniel Gravel, MD Primary Cardiologist: Nathaniel Harp MD [name], [place]  HPI:  [name] is a 87 y.o. 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  He was referred by Nathaniel Sharp PA-C for evaluation of PAD.  He smoked remotely.  He has a history of treated hypertension, diabetes and hyperlipidemia.  He has never had a heart attack or stroke.  He had CABG in 1987 and cardiac catheterization performed 4/19 revealed patent grafts.  This was done in evaluation of aortic stenosis.  He also has chronic renal insufficiency on peritoneal dialysis for last year which he does at home.  He really denies claudication.  There is no evidence of critical limb ischemia.  Dopplers performed 04/30/2020 revealed a noncompressible right ABI with a left ABI of 1.03.  He had an occluded distal right SFA with tibial vessel disease as well as well as tibial vessel disease on the left.   Current Meds  Medication Sig  . acetaminophen (TYLENOL) 650 MG CR tablet Take 650 mg by mouth at bedtime.  Marland Kitchen amLODipine (NORVASC) 10 MG tablet Take 10 mg by mouth at bedtime.  Marland Kitchen aspirin EC 81 MG tablet Take 81 mg by mouth daily.  Marland Kitchen atorvastatin (LIPITOR) 80 MG tablet TAKE 0.5 TABLETS (40 MG TOTAL) BY MOUTH AT BEDTIME.  . carboxymethylcellulose (REFRESH PLUS) 0.5 % SOLN Place 1 drop into both eyes 3 (three) times daily as needed (dry, itch).  . carvedilol (COREG) 3.125 MG tablet TAKE 1 TABLET (3.125 MG TOTAL) BY MOUTH 2 (TWO) TIMES DAILY WITH A MEAL.  Marland Kitchen Cholecalciferol 25 MCG (1000 UT) tablet Take by mouth.  . folic acid (FOLVITE) 338 MCG tablet Take 800 mcg by mouth daily.  . furosemide (LASIX) 80 MG tablet Take 1 tablet (80 mg total) by mouth 2 (two) times daily at 10 AM and 5 PM.  . glimepiride (AMARYL) 4 MG tablet Take 4 mg by mouth daily  with breakfast.   . hydrALAZINE (APRESOLINE) 50 MG tablet Take 1 tablet (50 mg total) by mouth in the morning and at bedtime.  . insulin glargine (LANTUS) 100 UNIT/ML injection Inject into the skin.  Marland Kitchen KLOR-CON M10 10 MEQ tablet TAKE 1 TABLET BY MOUTH EVERY DAY  . Multiple Vitamin (MULTIVITAMIN) tablet Take 1 tablet by mouth daily.   . nitroGLYCERIN (NITROSTAT) 0.4 MG SL tablet Place 1 tablet (0.4 mg total) under the tongue every 5 (five) minutes x 3 doses as needed for chest pain.  Marland Kitchen OVER THE COUNTER MEDICATION Place 1 spray into the nose daily as needed. Sovereign Silver Nasal Spray for Immune Support  . OVER THE COUNTER MEDICATION Take 1 capsule by mouth every morning. Pt takes as neeed.  . TRADJENTA 5 MG TABS tablet Take 5 mg by mouth every morning.   . traZODone (DESYREL) 50 MG tablet Take 50 mg by mouth at bedtime as needed for sleep.      No Known Allergies  Social History   Socioeconomic History  . Marital status: Married    Spouse name: [name]  . Number of children: Not on file  . Years of education: Not on file  . Highest education level: Not on file  Occupational History  . Occupation: Retired  Tobacco Use  . Smoking status: Former Smoker    Packs/day: 1.00  Years: 20.00    Pack years: 20.00    Types: Cigarettes    Quit date: 08/22/1972    Years since quitting: 47.7  . Smokeless tobacco: Never Used  Vaping Use  . Vaping Use: Never used  Substance and Sexual Activity  . Alcohol use: Not Currently    Comment: 06/20/2017 "nothing since ~ 1974"  . Drug use: No  . Sexual activity: Not Currently  Other Topics Concern  . Not on file  Social History Narrative  . Not on file   Social Determinants of Health   Financial Resource Strain: Not on file  Food Insecurity: No Food Insecurity  . Worried About Charity fundraiser in the Last Year: Never true  . Ran Out of Food in the Last Year: Never true  Transportation Needs: No Transportation Needs  . Lack of  Transportation (Medical): No  . Lack of Transportation (Non-Medical): No  Physical Activity: Sufficiently Active  . Days of Exercise per Week: 7 days  . Minutes of Exercise per Session: 40 min  Stress: Not on file  Social Connections: Not on file  Intimate Partner Violence: Not on file     Review of Systems: General: negative for chills, fever, night sweats or weight changes.  Cardiovascular: negative for chest pain, dyspnea on exertion, edema, orthopnea, palpitations, paroxysmal nocturnal dyspnea or shortness of breath Dermatological: negative for rash Respiratory: negative for cough or wheezing Urologic: negative for hematuria Abdominal: negative for nausea, vomiting, diarrhea, bright red blood per rectum, melena, or hematemesis Neurologic: negative for visual changes, syncope, or dizziness All other systems reviewed and are otherwise negative except as noted above.    Blood pressure 134/68, pulse 61, height 5\' 11"  (1.803 m), weight 166 lb (75.3 kg).  General appearance: alert and no distress Neck: no adenopathy, no JVD, supple, symmetrical, trachea midline, thyroid not enlarged, symmetric, no tenderness/mass/nodules and Bilateral carotid bruits versus transmitted murmur Lungs: clear to auscultation bilaterally Heart: 2/6 outflow tract murmur consistent with aortic stenosis Extremities: extremities normal, atraumatic, no cyanosis or edema Pulses: 2+ and symmetric Diminished pedal pulses Skin: Skin color, texture, turgor normal. No rashes or lesions Neurologic: Alert and oriented X 3, normal strength and tone. Normal symmetric reflexes. Normal coordination and gait  EKG sinus rhythm at 61 without ST or T wave changes.  Personally reviewed this EKG.  ASSESSMENT AND PLAN:   Peripheral artery disease [facility]) Mr. [name] was referred to me by Nathaniel Sharp PA-C for PAD.  He does have multiple cardiovascular risk factors.  He has no evidence of critical limb ischemia nor does he have  claudication.  He has occasional cramps in his legs.  He had Doppler studies performed 04/30/2020 revealing an ABI that was noncompressible on the right and 1.03 on the left.  He had an occluded right SFA distally and occluded tibial vessels on the left.  At this point, there is no indication for an invasive approach.      Nathaniel Harp MD FACP,FACC,FAHA, [facility] 05/07/2020 1:43 PM

## 2020-05-07 NOTE — Patient Instructions (Signed)
Medication Instructions:  Your physician recommends that you continue on your current medications as directed. Please refer to the Current Medication list given to you today.  *If you need a refill on your cardiac medications before your next appointment, please call your pharmacy*   Follow-Up: At [facility], you and your health needs are our priority.  As part of our continuing mission to provide you with exceptional heart care, we have created designated Provider Care Teams.  These Care Teams include your primary Cardiologist (physician) and Advanced Practice Providers (APPs -  Physician Assistants and Nurse Practitioners) who all work together to provide you with the care you need, when you need it.  We recommend signing up for the patient portal called "MyChart".  Sign up information is provided on this After Visit Summary.  MyChart is used to connect with patients for Virtual Visits (Telemedicine).  Patients are able to view lab/test results, encounter notes, upcoming appointments, etc.  Non-urgent messages can be sent to your provider as well.   To learn more about what you can do with MyChart, go to [email or web address].    Your next appointment:   No future appointments made at this time. We will see you on an as needed basis. If you need to make an appointment please call our office.   Provider:   Quay Burow, MD

## 2020-06-09 ENCOUNTER — Other Ambulatory Visit: Payer: Self-pay | Admitting: *Deleted

## 2020-06-09 NOTE — Patient Outreach (Signed)
[place] [facility]) Care Management  06/09/2020  [name] 1933/11/08 [id number]  Unsuccessful outreach attempt made to patient. Patient answered the phone and stated that he was on the highway driving and would not be able to talk today. He did request that this nurse call back at a later date.   Plan: RN Health Coach will call patient within the month of May.  Emelia Loron RN, BSN [place] [phone] [name]

## 2020-06-11 ENCOUNTER — Ambulatory Visit: Payer: Self-pay | Admitting: *Deleted

## 2020-06-24 ENCOUNTER — Telehealth: Payer: Self-pay | Admitting: Internal Medicine

## 2020-06-24 NOTE — Telephone Encounter (Signed)
Returned call to [place] NP with [place] (thru [facility]) He told NP that he has a blood clot in his right thigh He reports pain in his RLE when walking Explained to NP that patient has PAD per vascular screening in 04/2020 and was referred to Dr. Gwenlyn Found then No appointment had been scheduled Arranged new PV consult on 5/11 with Dr. Gwenlyn Found and patient will be present for this visit NP will explain PAD diagnosis to patient No further assistance needed

## 2020-06-24 NOTE — Telephone Encounter (Signed)
[name] with [place] is calling because she is currently with the patient who is complaining of pain in the groin area. He made her aware that he has a blood clot in his upper right leg. She would like to speak with Dr. Lysbeth Penner nurse regarding this and the fact that he is on 81mg  Aspirin.

## 2020-07-06 ENCOUNTER — Other Ambulatory Visit: Payer: Self-pay | Admitting: Cardiology

## 2020-07-07 ENCOUNTER — Telehealth: Payer: Self-pay

## 2020-07-07 ENCOUNTER — Ambulatory Visit: Payer: Medicare Other | Admitting: Cardiovascular Disease

## 2020-07-07 NOTE — Telephone Encounter (Signed)
Spoke with pt and wife regarding PV appointment made for today. Pt was under the impression that another appointment with Dr. Gwenlyn Found was needed per his home health nurse.  [facility] nurse contacted our office to set up [place] appointment and did not know that pt was seen 05/07/20 by Dr. Gwenlyn Found and at that time it was decided that pt would move forward with a noninvasive approach and that if/when the pt felt like a procedural or surgical intervention was necessary that he would reach back out to our office for a follow up with Dr. Gwenlyn Found. Pt agrees with this. Appointment for today cancelled and pt will contact us if he needs further assistance with his legs. Pt verbalizes understanding.

## 2020-07-12 ENCOUNTER — Other Ambulatory Visit: Payer: Self-pay | Admitting: *Deleted

## 2020-07-12 NOTE — Patient Outreach (Addendum)
[facility] [facility]) Care Management  [place]  07/12/2020   [name] Oct 26, 1933 [id number]  Subjective: Successful telephone outreach call to patient. HIPAA identifiers obtained. Patient reports that he has been feeling fatigued. He continues to weigh, take his B/P, take his blood sugar daily, and record the values. Nurse will send  patient a calendar booklet to record all of his values which has color zones and rescue action plans. Patient states his peritoneal dialysis is going well and he is well supported by his nephrologist and dialysis team members. Patient states he does have an upcoming visit with cardiology on 07/13/20. He continues to walk or ride his stationary bike but explains that he gets fatigued quickly. Nurse discussed with patient to increase the time he spends doing these physical activities by 1 minute per week until he is spending around 15 minutes each time he exercises. Patient states that his appetite has decreased. He is drinking a high protein supplements, he is on a fluid restriction due to dialysis, and nurse will send patient Ensure coupons. Patient states he gets dizzy at times but denies any falls or needs for DME. He reports being well supported by his wife [name]. Patient did not have any further questions or concerns today and did confirm that he has this nurse's contact number to call her if needed.   Encounter Medications:  Outpatient Encounter Medications as of 07/12/2020  Medication Sig  . acetaminophen (TYLENOL) 650 MG CR tablet Take 650 mg by mouth at bedtime.  Marland Kitchen amLODipine (NORVASC) 10 MG tablet Take 10 mg by mouth at bedtime.  Marland Kitchen aspirin EC 81 MG tablet Take 81 mg by mouth daily.  Marland Kitchen atorvastatin (LIPITOR) 80 MG tablet TAKE 0.5 TABLETS (40 MG TOTAL) BY MOUTH AT BEDTIME.  . carboxymethylcellulose (REFRESH PLUS) 0.5 % SOLN Place 1 drop into both eyes 3 (three) times daily as needed (dry, itch).  . carvedilol (COREG) 3.125 MG tablet TAKE 1  TABLET (3.125 MG TOTAL) BY MOUTH 2 (TWO) TIMES DAILY WITH A MEAL.  Marland Kitchen Cholecalciferol 25 MCG (1000 UT) tablet Take by mouth.  . folic acid (FOLVITE) 536 MCG tablet Take 800 mcg by mouth daily.  . furosemide (LASIX) 80 MG tablet TAKE 1 TABLET (80 MG TOTAL) BY MOUTH 2 (TWO) TIMES DAILY AT 10 AM AND 5 PM.  . glimepiride (AMARYL) 4 MG tablet Take 4 mg by mouth daily with breakfast.   . hydrALAZINE (APRESOLINE) 50 MG tablet Take 1 tablet (50 mg total) by mouth in the morning and at bedtime.  . insulin glargine (LANTUS) 100 UNIT/ML injection Inject into the skin.  Marland Kitchen KLOR-CON M10 10 MEQ tablet TAKE 1 TABLET BY MOUTH EVERY DAY  . Multiple Vitamin (MULTIVITAMIN) tablet Take 1 tablet by mouth daily.   . nitroGLYCERIN (NITROSTAT) 0.4 MG SL tablet Place 1 tablet (0.4 mg total) under the tongue every 5 (five) minutes x 3 doses as needed for chest pain.  Marland Kitchen OVER THE COUNTER MEDICATION Place 1 spray into the nose daily as needed. Sovereign Silver Nasal Spray for Immune Support  . OVER THE COUNTER MEDICATION Take 1 capsule by mouth every morning. Pt takes as neeed.  . TRADJENTA 5 MG TABS tablet Take 5 mg by mouth every morning.   . traZODone (DESYREL) 50 MG tablet Take 50 mg by mouth at bedtime as needed for sleep.    No facility-administered encounter medications on file as of 07/12/2020.    Functional Status:  No flowsheet data found.  Fall/Depression Screening: Fall Risk  07/12/2020 01/09/2020 10/31/2019  Falls in the past year? 0 0 0  Number falls in past yr: 0 0 0  Injury with Fall? 0 0 0  Risk for fall due to : History of fall(s);Impaired balance/gait;Impaired mobility Impaired balance/gait;Impaired mobility Impaired mobility;Impaired balance/gait  Risk for fall due to: Comment - dizziness at times -  Follow up Falls prevention discussed;Education provided;Falls evaluation completed Falls prevention discussed;Education provided;Falls evaluation completed Falls prevention discussed;Education  provided;Falls evaluation completed   PHQ 2/9 Scores 10/31/2019 06/02/2019 02/10/2019 11/11/2018 08/13/2018 04/16/2018 10/01/2017  PHQ - 2 Score 0 0 0 0 0 0 0    Assessment:  Goals Addressed   None    Plan: RN Health Coach will send PCP today's assessment note, will send patient diabetes education and a calendar booklet, and will call patient within the month of August. Follow-up:  Patient agrees to Care Plan and Follow-up.  Nathaniel Loron RN, BSN [place] [phone] [email or web address]

## 2020-07-13 ENCOUNTER — Other Ambulatory Visit: Payer: Self-pay

## 2020-07-13 ENCOUNTER — Encounter: Payer: Self-pay | Admitting: Internal Medicine

## 2020-07-13 ENCOUNTER — Ambulatory Visit: Payer: Medicare Other | Admitting: Internal Medicine

## 2020-07-13 VITALS — BP 148/64 | HR 63 | Ht 71.0 in | Wt 171.2 lb

## 2020-07-13 DIAGNOSIS — N186 End stage renal disease: Secondary | ICD-10-CM | POA: Diagnosis not present

## 2020-07-13 DIAGNOSIS — Z951 Presence of aortocoronary bypass graft: Secondary | ICD-10-CM

## 2020-07-13 DIAGNOSIS — I350 Nonrheumatic aortic (valve) stenosis: Secondary | ICD-10-CM

## 2020-07-13 DIAGNOSIS — I25810 Atherosclerosis of coronary artery bypass graft(s) without angina pectoris: Secondary | ICD-10-CM

## 2020-07-13 DIAGNOSIS — I739 Peripheral vascular disease, unspecified: Secondary | ICD-10-CM

## 2020-07-13 DIAGNOSIS — Z992 Dependence on renal dialysis: Secondary | ICD-10-CM

## 2020-07-13 NOTE — Patient Instructions (Signed)
Medication Instructions:  Your physician recommends that you continue on your current medications as directed. Please refer to the Current Medication list given to you today.  *If you need a refill on your cardiac medications before your next appointment, please call your pharmacy*   Testing/Procedures: Your physician has requested that you have an echocardiogram. Echocardiography is a painless test that uses sound waves to create images of your heart. It provides your doctor with information about the size and shape of your heart and how well your heart's chambers and valves are working. This procedure takes approximately one hour. There are no restrictions for this procedure. -- [address] - [address]rd Floor -- Due December 2022   Follow-Up: At [facility], you and your health needs are our priority.  As part of our continuing mission to provide you with exceptional heart care, we have created designated Provider Care Teams.  These Care Teams include your primary Cardiologist (physician) and Advanced Practice Providers (APPs -  Physician Assistants and Nurse Practitioners) who all work together to provide you with the care you need, when you need it.  We recommend signing up for the patient portal called "MyChart".  Sign up information is provided on this After Visit Summary.  MyChart is used to connect with patients for Virtual Visits (Telemedicine).  Patients are able to view lab/test results, encounter notes, upcoming appointments, etc.  Non-urgent messages can be sent to your provider as well.   To learn more about what you can do with MyChart, go to [email or web address].    Your next appointment:   December 2022 - after echo  The format for your next appointment:   In Person  Provider:   You may see Pixie Casino, MD or one of the following Advanced Practice Providers on your designated Care Team:    Almyra Deforest, PA-C  Fabian Sharp, PA-C or   Roby Lofts,  [place]    Other Instructions

## 2020-07-13 NOTE — Progress Notes (Signed)
OFFICE NOTE   Chief Complaint:  Follow-up dizziness  Primary Care Physician: Jani Gravel, MD  HPI:  [name] is a 87 year old with a history of coronary artery disease status post CABG in 1987, hypertension, dyslipidemia, diabetes, and peripheral arterial disease which is mild. Over the past year he has done well from a cardiac standpoint. He does have a history of prostate cancer and underwent radiation seed implant for that. He has also been having problems with confusion and brain fog and his medications were adjusted, including holding his statin, which did not cause much improvement. This may be some early dementia. In addition, he has abnormal lower extremity Dopplers, which I repeated. This has actually shown mild improvement in his ABIs with ABI 1.1 on the right and ABI of 0.92 on the left. The bilateral posterior tibials were occluded and there was 50% to 69% reduction in left common iliac and about a 0 to 49% reduction in the right superficial femoral. Despite this, he has no symptoms of claudication. He also denies any chest pain. He has had some shortness of breath and this, however, seems to be related to this episode over the past several months where he reported initially he was thought stung by a bee, developed swelling on the left side of his face and then subsequently developed extreme weakness and there as concern of a rheumatoid arthritis. His symptoms have improved with a steroid burst and tapered. He is currently on prednisone as well as methotrexate. He has also had recent dizziness. He reports head and sinus congestion, which may be contributing to his symptoms. However some of the dizziness is associated with walking up a hill at the end of exercise or walking down hills particularly. Is not associated with change in position or quick head movements.  There is some associated fatigue and chest fullness with exercise and improves with rest, reminiscent of possible  angina.  He underwent bilateral carotid Dopplers which showed a mild amount of plaque. A nuclear stress test performed which demonstrated no reversible ischemia and preserved ejection fraction. He still reports he has some mild dizziness and does fatigue a little at the beginning of exercise but improves when he continues to exercise.  He is generally without complaints today. He reports that recently blood work indicated that his kidney function was worsening and that he is now scheduled to see a nephrologist next Monday. I do not yet have those results in front of me. His metformin was discontinued due to this but he remains on lisinopril and that may need to be adjusted. He denies any specific anginal symptoms. He has some very mild claudication symptoms but it improves with walking. He continues to have a systolic murmur best heard over the aortic area and does have a history of aortic sclerosis in the past.  [name] returns today for follow-up. Unfortunately says he hasn't taken his medicine today. He is supposed to take hydralazine 100 mg 3 times a day and for some reason has not taken 2 doses this morning. Blood pressure was very high at 196/86 but he denied any chest pain or headache. He says he going to take his medicine when he gets home. Is also on Flomax but no other blood pressure medicines. This is probably due to worsening renal function. Currently his creatinine is around 3 and is followed by Dr. Mercy Moore. Is also on Lipitor and cholesterol was recently checked by Dr. Wilson Singer.  10/07/2015  [name]  returns today for follow-up. Over the past year he's had no new complaints although his blood pressures been difficult to control. He's followed by Dr. Mercy Moore. He was previously on amlodipine but that's been discontinued. He is now on hydralazine, clonidine and valsartan. He did not take his medicines today and his blood pressure is elevated. He says he takes the clonidine once or twice  per day. He tries to void in the morning because it causes extreme fatigue. We had previously discussed the Catapres patch however cost was limiting. He also reports recently some worsening memory.  10/16/2016  [name] returns for follow-up. Many years since I last saw him. He's called in for some chest pain which she said got better. His last stress test was in 2016 which was negative for ischemia. He was noted to have some aortic sclerosis in 2015 however last year was noted to have a louder systolic murmur which sounds more like aortic stenosis. Blood pressures not been well controlled. Today's elevated again 160/70. He says his blood pressure medicines make him feel tired. He told me that he takes all for his blood pressure medicines at night. When asked further about his hydralazine which is supposed to be taken 3 times a day he says he does not do that. He then reported that his blood pressure typically runs higher in the morning and throughout the day. He currently denies any chest pain. He reports some left lower strandy swelling but also has numbness and tingling has been undergoing and back injections.  10/27/2016  [name] had adjustments on his medications. He is now taking a hydralazine 3 times a day, irbesartan in the morning and clonidine at night. I reviewed his blood pressures which tend to range in the 140s in the morning before taking medication, in the 120s in the afternoon after medication and typically in the 120s over 80s at night. This indicates adequate treatment of his blood pressure. He did undergo an echocardiogram for aortic stenosis. This demonstrated a normal LVEF of 60-65% with mild aortic stenosis and a mean gradient of 11 mmHg. He understands the implications of this and that we will continue to follow his aortic valve disease.  02/28/2017  [name] presents today with significant shortness of breath, weight gain and leg edema as well as increasing abdominal  girth over the past 2-3 weeks.  The symptoms came on gradually but clinically worsened.  Weight in August was 185 pounds, he now weighs 204 pounds.  Reports worsening shortness of breath, orthopnea and PND.  Blood pressure was also elevated today 183/75 and recently has been difficult to control.  His primary care provider recommended increasing his clonidine to 0.1 mg 3 times daily, however he has had significant daytime fatigue with this.  03/16/2017  [name] returns today for follow-up of heart failure.  His weight back in August was 185 pounds and he gained up to 204 pounds.  When I last saw him I doubled his diuretic and increased his blood pressure medication.  Over the past 2 weeks he has diuresed back down to 182 pounds.  He feels markedly better.  Blood pressure is better controlled today 132/58.  He feels like his energy level has improved.  We obtain blood work when he was in congestive heart failure which showed an elevated creatinine over 2 and a baseline around 1.6.  He was hypokalemic and I increased his potassium.  His echo showed an EF of 65-70% with grade 2 diastolic  dysfunction and high LV filling pressures.  06/19/2017  [name] was seen today in follow-up.  I last saw him in January, at which time he was complaining of shortness of breath and edema.  Weight was up to 204 pounds.  He was given diuretics and his weight has come down significantly.  In fact, his creatinine rose up to 2.7 and weight was down to 182.  He saw his nephrologist, Dr. Hollie Salk, recently who decreased his Lasix further and started amlodipine 10 mg daily.  He now says over the past several months he has had progressive fatigue, decreased exercise tolerance and worsening shortness of breath.  He wonders if it is related to his aortic valve.  In January was noted to have normal systolic function and mild aortic stenosis.  On exam, he has what sounds like moderate left ear with an audible second heart sound.  He  again looks volume overloaded today and weight is back up to 193 pounds, up from 182 pounds.  He denies any chest pain.  01/02/2018  [name] returns today for follow-up of his heart failure.  Recently he has been seen by nephrology.  He felt that he is renal failure has been fairly stable.  Plans were to possibly resume ARB therapy.  His aortic stenosis is mild to moderate and followed by [place].  His EKG shows sinus rhythm at 65.  He denies any worsening shortness of breath.  Weight has been stable.  I had referred him for heart cath in April 2019 which showed two-vessel coronary disease with 3 out of 3 patent bypass grafts.  Medical therapy was recommended however it was felt to be volume overloaded.  Subsequently he was admitted with heart failure and diuresis.  His weight now is down significantly to 170 pounds from 182 most recently.  He seems to be feeling much better.  08/06/2019  [name] was again admitted for heart failure in February of this past year.  Ultimately he had progression of renal failure with hypokalemia and congestive heart failure and transition to peritoneal dialysis.  Since then he is done much better and is fairly stable.  Weight is come down to about 155 although measured 160 in our office today.  Blood pressure has been well controlled at home.  He denies any palpitations or chest pain.  He is noted to have a history of aortic stenosis which sounds mild to moderate on physical exam.  EKG personally reviewed today shows sinus rhythm.  01/09/2020  [name] returns today for follow-up.  More recently he has noted increasing fatigue and shortness of breath.  He had an echo earlier in the year which showed mild to moderate aortic stenosis however by exam it appeared to be more moderate to severe in my opinion.  We were going to schedule repeat echo earlier next year however I think he should have it sooner.  He denies any anginal symptoms.  Blood pressure is well controlled  today in fact at times he is log indicates his blood pressure may get a little too low.  This could be contributing to some symptoms of dizziness that he is reporting.  EKG today shows a sinus bradycardia with voltage criteria for LVH at 38.  07/14/2018  [name] is seen today in follow-up.  He reports his dizziness is improved somewhat.  I had decreased his hydralazine but also they made some changes in his dialysis.  He apparently does home hemodialysis through peritoneal catheter.  He is followed by Dr. Hollie Salk.  He had mild to moderate aortic stenosis with a mean gradient of 19 mmHg in December 2021.  He will need a repeat echo in about a year.  He underwent vascular lower extremity Dopplers in March after seeing Doreene Adas, PA-C and was then referred to Dr. Gwenlyn Found for PAD.  He was not felt to have critical limb ischemia or lifestyle limiting claudication and therefore medical therapy was recommended.  PMHx:  Past Medical History:  Diagnosis Date  . Arthritis    "a little in LLE" (06/20/2017)  . Chronic diastolic CHF (congestive heart failure) ([place])   . CKD (chronic kidney disease), stage IV ([place])   . Coronary artery disease    a. s/p CABG 1980s. b. stable by cath 05/2017.  Marland Kitchen Dyslipidemia   . Edema 08/08/2017   HANDS & LOWER EXTREMITES  . Hypertension   . Hypoalbuminemia   . Nephrotic range proteinuria   . Peripheral artery disease ([facility])    mild  . Pneumonia ~ 1950 X 1  . Prostate cancer ([place]) ~ 2015  . Type II diabetes mellitus ([place])     Past Surgical History:  Procedure Laterality Date  . CARDIAC CATHETERIZATION     "a couple times" (06/20/2017)  . CATARACT EXTRACTION W/ INTRAOCULAR LENS  IMPLANT, BILATERAL Bilateral   . CORONARY ARTERY BYPASS GRAFT  03/06/1985   LAD and first diagonal/circumflex (sequential saphenous vein graft,cardioplegia  . INSERTION PROSTATE RADIATION SEED  ~ 2015  . NM MYOCAR PERF WALL MOTION  09/12/2006   no ischemia  . RIGHT/LEFT HEART CATH AND  CORONARY/GRAFT ANGIOGRAPHY N/A 06/21/2017   Procedure: RIGHT/LEFT HEART CATH AND CORONARY/GRAFT ANGIOGRAPHY;  Surgeon: Burnell Blanks, MD;  Location: [facility];  Service: Cardiovascular;  Laterality: N/A;  . US ECHOCARDIOGRAPHY  11/04/2008   trace TR & MR    FAMHx:  Family History  Problem Relation Age of Onset  . Heart attack Mother     SOCHx:   reports that he quit smoking about 47 years ago. His smoking use included cigarettes. He has a 20.00 pack-year smoking history. He has never used smokeless tobacco. He reports previous alcohol use. He reports that he does not use drugs.  ALLERGIES:  No Known Allergies  ROS: Pertinent items noted in HPI and remainder of comprehensive ROS otherwise negative.  HOME MEDS: Current Outpatient Medications  Medication Sig Dispense Refill  . acetaminophen (TYLENOL) 650 MG CR tablet Take 650 mg by mouth at bedtime.    Marland Kitchen amLODipine (NORVASC) 10 MG tablet Take 10 mg by mouth at bedtime.    Marland Kitchen aspirin EC 81 MG tablet Take 81 mg by mouth daily.    Marland Kitchen atorvastatin (LIPITOR) 80 MG tablet TAKE 0.5 TABLETS (40 MG TOTAL) BY MOUTH AT BEDTIME. 45 tablet 4  . carboxymethylcellulose (REFRESH PLUS) 0.5 % SOLN Place 1 drop into both eyes 3 (three) times daily as needed (dry, itch).    . carvedilol (COREG) 3.125 MG tablet TAKE 1 TABLET (3.125 MG TOTAL) BY MOUTH 2 (TWO) TIMES DAILY WITH A MEAL. 180 tablet 3  . Cholecalciferol 25 MCG (1000 UT) tablet Take by mouth.    . folic acid (FOLVITE) 950 MCG tablet Take 800 mcg by mouth daily.    . furosemide (LASIX) 80 MG tablet TAKE 1 TABLET (80 MG TOTAL) BY MOUTH 2 (TWO) TIMES DAILY AT 10 AM AND 5 PM. 180 tablet 3  . glimepiride (AMARYL) 4 MG tablet Take 4 mg by  mouth daily with breakfast.     . hydrALAZINE (APRESOLINE) 50 MG tablet Take 1 tablet (50 mg total) by mouth in the morning and at bedtime. 180 tablet 3  . insulin glargine (LANTUS) 100 UNIT/ML injection Inject into the skin.    Marland Kitchen KLOR-CON M10 10 MEQ  tablet TAKE 1 TABLET BY MOUTH EVERY DAY 90 tablet 2  . Multiple Vitamin (MULTIVITAMIN) tablet Take 1 tablet by mouth daily.     . nitroGLYCERIN (NITROSTAT) 0.4 MG SL tablet Place 1 tablet (0.4 mg total) under the tongue every 5 (five) minutes x 3 doses as needed for chest pain. 25 tablet 4  . OVER THE COUNTER MEDICATION Place 1 spray into the nose daily as needed. Sovereign Silver Nasal Spray for Immune Support    . OVER THE COUNTER MEDICATION Take 1 capsule by mouth every morning. Pt takes as neeed.    . TRADJENTA 5 MG TABS tablet Take 5 mg by mouth every morning.     . traZODone (DESYREL) 50 MG tablet Take 50 mg by mouth at bedtime as needed for sleep.   12   No current facility-administered medications for this visit.    LABS/IMAGING: No results found for this or any previous visit (from the past 48 hour(s)). No results found.  VITALS: BP (!) 148/64 (BP Location: Left Arm, Patient Position: Sitting, Cuff Size: Normal)   Pulse 63   Ht 5\' 11"  (1.803 m)   Wt 171 lb 3.2 oz (77.7 kg)   BMI 23.88 kg/m   EXAM: General appearance: alert, no distress and moderately obese Neck: no carotid bruit, no JVD and thyroid not enlarged, symmetric, no tenderness/mass/nodules Lungs: clear to auscultation bilaterally Heart: regular rate and rhythm, S1, S2 normal and systolic murmur: systolic ejection 3/6, crescendo at 2nd right intercostal space Abdomen: soft, non-tender; bowel sounds normal; no masses,  no organomegaly Extremities: extremities normal, atraumatic, no cyanosis or edema Pulses: 2+ and symmetric Skin: Skin color, texture, turgor normal. No rashes or lesions Neurologic: Grossly normal Psych: Pleasant  EKG: Normal sinus rhythm at 63, minimal voltage criteria for LVH-personally reviewed  ASSESSMENT: 1.   Chronic diastolic congestive heart failure 1. ESRD on PD 2. Aortic stenosis 3. Coronary artery disease status post CABG in 1987 -patent bypass grafts by cath  (05/2017) 4. Hypertension 5. Hyperlipidemia 6. Diabetes type 2 7. Mild PAD - intermittent claudication, not lifestyle limiting 8. Possible rheumatoid arthritis  PLAN:  1.   [name] seems to have had some improvement in his dizziness.  Changes have been made with dialysis.  He had mild to moderate aortic stenosis by echo in December 2021 and has a mid peaking murmur.  We will repeat an echo next December.  He did have some mild PAD but without lifestyle limiting claudication.  Plan to continue his current therapies and follow-up with me in 6 months after his echo.  Pixie Casino, MD, [place], [place] Director of the [facility] Attending Cardiologist  Direct Dial: [phone]  Fax: [phone]  Website:  [email or web address] 07/13/2020, 10:01 AM

## 2020-07-13 NOTE — Patient Instructions (Signed)
Goals Addressed            This Visit's Progress   . [facility]) Track and Manage Fluids and Swelling       Timeframe:  Long-Range Goal Priority:  High Start Date:  01/09/20                           Expected End Date: 01/26/21             Follow Up Date 10/26/20   - call office if I gain more than 2 pounds in one day or 5 pounds in one week - keep legs up while sitting - track weight in diary - use salt in moderation - watch for swelling in feet, ankles and legs every day - weigh myself daily  -Patient will increase the amount of time spent walking or riding stationary bike by increasing the time by one minute per week until he reaches physical activity of 15 minutes.   Why is this important?   It is important to check your weight daily and watch how much salt and liquids you have.  It will help you to manage your heart failure.    Notes: Wife states that the patient is weighing himself, taking his B/P, and recording the values daily. Nurse encouraged bringing weight and B/P daily recordings to provider appointments.   Updated 07/12/20: Patient states he continue to weigh daily and monitor for symptoms of CHF. He has an upcoming visit with cardiology and does drink a high protein supplement due to decrease appetite. Nurse will send patient a calendar booklet for him to record his weight, B/P, and blood sugar. The calendar booklet also has color zones and rescue action plans to follow. Discussed increasing physical activity slowly by adding an additional minute per week to the time spent walking or riding stationary bike.     . ([facility])Track and Manage Symptoms       Timeframe:  Long-Range Goal Priority:  High Start Date: 01/09/20                            Expected End Date: 01/26/21                     Follow Up Date 10/26/20   - begin a heart failure diary - bring diary to all appointments - develop a rescue plan - eat more whole grains, fruits and vegetables, lean meats and healthy  fats - follow rescue plan if symptoms flare-up - know when to call the doctor - track symptoms and what helps feel better or worse    Why is this important?   You will be able to handle your symptoms better if you keep track of them.  Making some simple changes to your lifestyle will help.  Eating healthy is one thing you can do to take good care of yourself.    Notes: Discussed CHF zones and action plans with wife [name]. Nurse will send patient CHF, hypertension, and planning healthy meals packet.   Updated 07/12/20: Patient states he continue to weigh daily and monitor for symptoms of CHF. He has an upcoming visit with cardiology and does drink a high protein supplement due to decrease appetite. Nurse will send patient a calendar booklet for him to record his weight, B/P, and blood sugar. The calendar booklet also has color zones and rescue action plans to follow.     Marland Kitchen  COMPLETED: Patient will not have a hospitalization related to HF symptoms over the next 90 days.       CARE PLAN ENTRY (see longtitudinal plan of care for additional care plan information)   Current Barriers:  Marland Kitchen Knowledge deficit related to basic heart failure pathophysiology and self care management  Case Manager Clinical Goal(s):  Marland Kitchen Over the next 90 days, patient will verbalize understanding of Heart Failure Action Plan and when to call doctor . Over the next 90 days, patient will take all Heart Failure mediations as prescribed . Over the next 90 days, patient will weigh daily and record (notifying MD of 3 lb weight gain over night or 5 lb in a week) . Patient will verbalize two symptoms of CHF exacerbation within the next 90 days.   . Patient will verbalize receiving annual flu vaccine within the next 90 days.  . Patient will continue to walk 4-5 times weekly.  Interventions:  . Provided verbal education on low sodium diet . Reviewed Heart Failure Action Plan in depth and provided written copy . Assessed need for  readable accurate scales in home . Provided education about placing scale on hard, flat surface . Advised patient to weigh each morning after emptying bladder . Discussed importance of daily weight and advised patient to weigh and record daily  Patient Self Care Activities:  . Takes Heart Failure Medications as prescribed . Weighs daily and record (notifying MD of 3 lb weight gain over night or 5 lb in a week) . Verbalizes understanding of and follows CHF Action Plan . Adheres to low sodium diet . Patient walks routinely 4-5 times weekly  Please see past updates related to this goal by clicking on the "Past Updates" button in the selected goal  Updated: 01/09/20 Timeframe:  Long-Range Goal Priority:  High Start Date: 10/31/19                            Expected End Date: 08/26/20 Follow up: 05/26/20                     07/12/20: Resolved due to duplicate goals.

## 2020-07-25 ENCOUNTER — Other Ambulatory Visit: Payer: Self-pay | Admitting: Internal Medicine

## 2020-10-07 ENCOUNTER — Telehealth: Payer: Self-pay | Admitting: Internal Medicine

## 2020-10-07 NOTE — Telephone Encounter (Signed)
   [facility] Pre-operative Risk Assessment    Patient Name: [name]  DOB: 08-24-1933 MRN: [id number]   Request for surgical clearance:  What type of surgery is being performed? Hernia surgery   When is this surgery scheduled? TBD  What type of clearance is required (medical clearance vs. Pharmacy clearance to hold med vs. Both)? Both   Are there any medications that need to be held prior to surgery and how long? Aspirin  Practice name and name of physician performing surgery? Johnathan Hausen MD [facility])  What is the office phone number? [phone]   7.   What is the office fax number? [phone] Carlene Coria, [place])  8.   Anesthesia type (None, local, MAC, general) ? General    Sheral Apley M 10/07/2020, 3:46 PM  _________________________________________________________________   (provider comments below)

## 2020-10-08 ENCOUNTER — Other Ambulatory Visit: Payer: Self-pay | Admitting: *Deleted

## 2020-10-08 NOTE — Patient Outreach (Signed)
[facility]) Care Management  10/08/2020  [name] 1933-08-17 [id number]  Unsuccessful outreach attempt made to patient. RN Health Coach left HIPAA compliant voicemail message along with her contact information.  Plan: RN Health Coach will call patient within the month of September.  Emelia Loron RN, BSN [place] [phone] [email or web address]

## 2020-10-09 NOTE — Telephone Encounter (Signed)
Ok to hold aspirin 7 days prior to surgery - acceptable risk for surgery. Restart aspirin when at low bleeding risk after surgery.  Dr Debara Pickett

## 2020-10-11 ENCOUNTER — Telehealth: Payer: Self-pay | Admitting: Internal Medicine

## 2020-10-11 MED ORDER — HYDRALAZINE HCL 50 MG PO TABS: 50.0000 mg | ORAL_TABLET | Freq: Two times a day (BID) | ORAL | 1 refills | Status: DC

## 2020-10-11 NOTE — Telephone Encounter (Signed)
   Name: [name]  DOB: 09-27-1933  MRN: [id number]   Primary Cardiologist: Pixie Casino, MD  Chart reviewed as part of pre-operative protocol coverage. Patient was contacted 10/11/2020 in reference to pre-operative risk assessment for pending surgery as outlined below.  [name] was last seen on 07/13/20 by Dr. Debara Pickett.  Since that day, [name] has done fine from a cardiac standpoint. He has chronic stable DOE. He feels limited in activity by fatigue but no complaints of chest pain. He can complete 4 METs without anginal complaints.   Therefore, based on ACC/AHA guidelines, the patient would be at acceptable risk for the planned procedure without further cardiovascular testing.   The patient was advised that if he develops new symptoms prior to surgery to contact our office to arrange for a follow-up visit, and he verbalized understanding.  Per Dr. Debara Pickett, patient can hold aspirin 7 days prior to his upcoming procedure with plans to restart when cleared to do so by his surgeon.   I will route this recommendation to the requesting party via Epic fax function and remove from pre-op pool. Please call with questions.  Abigail Butts, PA-C 10/11/2020, 10:14 AM

## 2020-10-11 NOTE — Telephone Encounter (Signed)
*  STAT* If patient is at the pharmacy, call can be transferred to refill team.   1. Which medications need to be refilled? (please list name of each medication and dose if known) hydrALAZINE (APRESOLINE) 50 MG tablet   2. Which pharmacy/location (including street and city if local pharmacy) is medication to be sent to? [id number] - [place], [place] - [place]  3. Do they need a 30 day or 90 day supply? 90 ds

## 2020-11-08 ENCOUNTER — Other Ambulatory Visit: Payer: Self-pay | Admitting: *Deleted

## 2020-11-08 NOTE — Patient Instructions (Addendum)
Goals Addressed             This Visit's Progress    [facility]) Track and Manage Fluids and Swelling       Timeframe:  Long-Range Goal Priority:  High Start Date:  01/09/20                           Expected End Date: 01/26/21             Follow Up Date 02/25/21   - call office if I gain more than 2 pounds in one day or 5 pounds in one week - keep legs up while sitting - track weight in diary - use salt in moderation - watch for swelling in feet, ankles and legs every day - weigh myself daily  -Patient will not sit for long periods, he will get up and walk around his house and to the mailbox as tolerated   Why is this important?   It is important to check your weight daily and watch how much salt and liquids you have.  It will help you to manage your heart failure.    Notes: 11/08/20: Patient states that he weighs 2 times daily. Currently, he is unable to ride his stationary bike due to inguinal hernia. Patient has been cleared for hernia surgery on 12/03/20 and has no questions or concerns about his upcoming surgery. Patient reports getting up to walk around his house or to the mailbox as tolerated. He continues to have fatigue and SOB with exertion. He denies any leg swelling at this time. Patient reports that he is maintaining his weight but he does not have much of an appetite. He does drink supplements and nurse will send Ensure coupons.   Updated 07/12/20: Patient states he continue to weigh daily and monitor for symptoms of CHF. He has an upcoming visit with cardiology and does drink a high protein supplement due to decrease appetite. Nurse will send patient a calendar booklet for him to record his weight, B/P, and blood sugar. The calendar booklet also has color zones and rescue action plans to follow. Discussed increasing physical activity slowly by adding an additional minute per week to the time spent walking or riding stationary bike.   01/08/21: Wife states that the patient is  weighing himself, taking his B/P, and recording the values daily. Nurse encouraged bringing weight and B/P daily recordings to provider appointments.      ([facility])Track and Manage Symptoms       Timeframe:  Long-Range Goal Priority:  High Start Date: 01/09/20                            Expected End Date: 01/26/21                     Follow Up Date 02/25/21   - begin a heart failure diary - bring diary to all appointments - develop a rescue plan - eat more whole grains, fruits and vegetables, lean meats and healthy fats - follow rescue plan if symptoms flare-up - know when to call the doctor - track symptoms and what helps feel better or worse  -Encouraged patient to continue to monitor his B/P daily and record the values  Why is this important?   You will be able to handle your symptoms better if you keep track of them.  Making some simple changes to your  lifestyle will help.  Eating healthy is one thing you can do to take good care of yourself.    Notes: Updated 11/08/20: Patient states he weighs 2 times daily. His weight this morning was 77 Kg. He reports at night it will go up 1-2 Kg but after his peritoneal dialysis overnight, his morning weight comes back down.   Updated 07/12/20: Patient states he continue to weigh daily and monitor for symptoms of CHF. He has an upcoming visit with cardiology and does drink a high protein supplement due to decrease appetite. Nurse will send patient a calendar booklet for him to record his weight, B/P, and blood sugar. The calendar booklet also has color zones and rescue action plans to follow.   01/09/20:Discussed CHF zones and action plans with wife [name]. Nurse will send patient CHF, hypertension, and planning healthy meals packet.

## 2020-11-08 NOTE — Patient Outreach (Signed)
[name]) Care Management  [place]  11/08/2020   [name] 06/29/1933 [id number]  Subjective: Successful telephone outreach call to patient. HIPAA identifiers obtained. Patient states he is dong well at this time. Nurse discussed with patient his health goals and his health and wellness needs which were documented in the Epic system. Patient has upcoming hernia surgery scheduled for 12/03/20. He reports feeling well supported by the surgeon and his care team and has no concerns or questions about the surgery. Patient explains that his home environment is safe and he well supported by his wife [name].   Encounter Medications:  Outpatient Encounter Medications as of 11/08/2020  Medication Sig   acetaminophen (TYLENOL) 650 MG CR tablet Take 650 mg by mouth at bedtime.   amLODipine (NORVASC) 10 MG tablet Take 10 mg by mouth at bedtime.   aspirin EC 81 MG tablet Take 81 mg by mouth daily.   atorvastatin (LIPITOR) 80 MG tablet TAKE 0.5 TABLETS (40 MG TOTAL) BY MOUTH AT BEDTIME.   carboxymethylcellulose (REFRESH PLUS) 0.5 % SOLN Place 1 drop into both eyes 3 (three) times daily as needed (dry, itch).   carvedilol (COREG) 3.125 MG tablet TAKE 1 TABLET (3.125 MG TOTAL) BY MOUTH 2 (TWO) TIMES DAILY WITH A MEAL.   Cholecalciferol 25 MCG (1000 UT) tablet Take by mouth.   folic acid (FOLVITE) 350 MCG tablet Take 800 mcg by mouth daily.   furosemide (LASIX) 80 MG tablet TAKE 1 TABLET (80 MG TOTAL) BY MOUTH 2 (TWO) TIMES DAILY AT 10 AM AND 5 PM.   glimepiride (AMARYL) 4 MG tablet Take 4 mg by mouth daily with breakfast.    hydrALAZINE (APRESOLINE) 50 MG tablet Take 1 tablet (50 mg total) by mouth in the morning and at bedtime.   insulin glargine (LANTUS) 100 UNIT/ML injection Inject into the skin.   KLOR-CON M10 10 MEQ tablet TAKE 1 TABLET BY MOUTH EVERY DAY   Multiple Vitamin (MULTIVITAMIN) tablet Take 1 tablet by mouth daily.    nitroGLYCERIN (NITROSTAT) 0.4 MG SL  tablet Place 1 tablet (0.4 mg total) under the tongue every 5 (five) minutes x 3 doses as needed for chest pain.   OVER THE COUNTER MEDICATION Place 1 spray into the nose daily as needed. Sovereign Silver Nasal Spray for Immune Support   OVER THE COUNTER MEDICATION Take 1 capsule by mouth every morning. Pt takes as neeed.   TRADJENTA 5 MG TABS tablet Take 5 mg by mouth every morning.    traZODone (DESYREL) 50 MG tablet Take 50 mg by mouth at bedtime as needed for sleep.    No facility-administered encounter medications on file as of 11/08/2020.    Functional Status:  No flowsheet data found.  Fall/Depression Screening: Fall Risk  11/08/2020 07/12/2020 01/09/2020  Falls in the past year? 0 0 0  Number falls in past yr: 0 0 0  Injury with Fall? 0 0 0  Risk for fall due to : History of fall(s);Impaired balance/gait;Impaired mobility History of fall(s);Impaired balance/gait;Impaired mobility Impaired balance/gait;Impaired mobility  Risk for fall due to: Comment - - dizziness at times  Follow up Falls prevention discussed;Education provided;Falls evaluation completed Falls prevention discussed;Education provided;Falls evaluation completed Falls prevention discussed;Education provided;Falls evaluation completed   PHQ 2/9 Scores 07/13/2020 10/31/2019 06/02/2019 02/10/2019 11/11/2018 08/13/2018 04/16/2018  PHQ - 2 Score 0 0 0 0 0 0 0    Assessment:   Care Plan There are no care plans that you recently modified to  display for this patient.    Goals Addressed             This Visit's Progress    [facility]) Track and Manage Fluids and Swelling       Timeframe:  Long-Range Goal Priority:  High Start Date:  01/09/20                           Expected End Date: 01/26/21             Follow Up Date 02/25/21   - call office if I gain more than 2 pounds in one day or 5 pounds in one week - keep legs up while sitting - track weight in diary - use salt in moderation - watch for swelling in feet, ankles  and legs every day - weigh myself daily  -Patient will not sit for long periods, he will get up and walk around his house and to the mailbox as tolerated   Why is this important?   It is important to check your weight daily and watch how much salt and liquids you have.  It will help you to manage your heart failure.    Notes: 11/08/20: Patient states that he weighs 2 times daily. Currently, he is unable to ride his stationary bike due to inguinal hernia. Patient has been cleared for hernia surgery on 12/03/20 and has no questions or concerns about his upcoming surgery. Patient reports getting up to walk around his house or to the mailbox as tolerated. He continues to have fatigue and SOB with exertion. He denies any leg swelling at this time. Patient reports that he is maintaining his weight but he does not have much of an appetite. He does drink supplements and nurse will send Ensure coupons.   Updated 07/12/20: Patient states he continue to weigh daily and monitor for symptoms of CHF. He has an upcoming visit with cardiology and does drink a high protein supplement due to decrease appetite. Nurse will send patient a calendar booklet for him to record his weight, B/P, and blood sugar. The calendar booklet also has color zones and rescue action plans to follow. Discussed increasing physical activity slowly by adding an additional minute per week to the time spent walking or riding stationary bike.   01/08/21: Wife states that the patient is weighing himself, taking his B/P, and recording the values daily. Nurse encouraged bringing weight and B/P daily recordings to provider appointments.      ([facility])Track and Manage Symptoms       Timeframe:  Long-Range Goal Priority:  High Start Date: 01/09/20                            Expected End Date: 01/26/21                     Follow Up Date 02/25/21   - begin a heart failure diary - bring diary to all appointments - develop a rescue plan - eat more whole  grains, fruits and vegetables, lean meats and healthy fats - follow rescue plan if symptoms flare-up - know when to call the doctor - track symptoms and what helps feel better or worse  -Encouraged patient to continue to monitor his B/P daily and record the values  Why is this important?   You will be able to handle your symptoms better if you keep track of  them.  Making some simple changes to your lifestyle will help.  Eating healthy is one thing you can do to take good care of yourself.    Notes: Updated 11/08/20: Patient states he weighs 2 times daily. His weight this morning was 77 Kg. He reports at night it will go up 1-2 Kg but after his peritoneal dialysis overnight, his morning weight comes back down.   Updated 07/12/20: Patient states he continue to weigh daily and monitor for symptoms of CHF. He has an upcoming visit with cardiology and does drink a high protein supplement due to decrease appetite. Nurse will send patient a calendar booklet for him to record his weight, B/P, and blood sugar. The calendar booklet also has color zones and rescue action plans to follow.   01/09/20:Discussed CHF zones and action plans with wife [name]. Nurse will send patient CHF, hypertension, and planning healthy meals packet.         Plan: RN Health Coach will send PCP today's assessment note, will send the patient Ensure coupons, and will call the patient within the month of December. Follow-up: Patient agrees to Care Plan and Follow-up.  Emelia Loron RN, BSN [place] [phone] [email or web address]

## 2020-11-12 ENCOUNTER — Ambulatory Visit: Payer: Medicare Other | Admitting: *Deleted

## 2020-11-15 NOTE — Progress Notes (Signed)
Requested orders with Dr. Earlie Server office.

## 2020-11-17 ENCOUNTER — Encounter (HOSPITAL_COMMUNITY): Payer: Self-pay | Admitting: Surgery

## 2020-11-17 NOTE — Progress Notes (Addendum)
For Short Stay: [place] appointment date: N/A Date of COVID positive in last 90 days: No   For Anesthesia: PCP - [facility] Dr. Jani Gravel formerly saw patient but is no longer at that practice Cardiologist - Lyman Bishop, MD last office visit 07/13/20 in epic, cardiac clearance in epic 10/07/20 Nephrologist- Dr. Madelon Lips [facility]  Chest x-ray - greater than 1 year in epic EKG - 07/13/20 in epic Stress Test - greater than 2 years in epic ECHO - 02/09/2020 in epic Cardiac Cath - greater than 2 years in epic Pacemaker/ICD device last checked: N/A  Sleep Study - N/A CPAP - N/A  Fasting Blood Sugar - 200 Checks Blood Sugar __Continuous___ times a day  Blood Thinner Instructions: N/A Aspirin Instructions: Per Dr. Debara Pickett, patient can hold aspirin 7 days prior to his upcoming procedure Last Dose:Hold 7 days  Activity level: Can go up a flight of stairs and activities of daily living without stopping and without chest pain and/or shortness of breath    Anesthesia review: CABG, CAD, DM, CHF, HTN, CKDIV/End stage renal disease/peritoneal dialysis, Mild to Moderate Aortic stenosis noted on echo  Patient denies shortness of breath, fever, cough and chest pain at PAT appointment   Patient verbalized understanding of instructions that were given to them at the PAT appointment. Patient was also instructed that they will need to review over the PAT instructions again at home before surgery.

## 2020-11-23 ENCOUNTER — Other Ambulatory Visit: Payer: Self-pay

## 2020-11-23 ENCOUNTER — Encounter (HOSPITAL_COMMUNITY): Payer: Self-pay | Admitting: Surgery

## 2020-11-25 NOTE — Progress Notes (Signed)
Anesthesia Chart Review   Case: [id number] Date/Time: 12/03/20 1224   Procedure: OPEN RIGHT INGUINAL HERNIA REPAIR (Right)   Anesthesia type: General   Pre-op diagnosis: RIGHT INGUINAL HERNIA, SYMPTOMATIC   Location: [facility] ROOM 08 / [facility]   Surgeons: Nathaniel Hausen, MD       DISCUSSION:87 y.o. former smoker with h/o HTN, DM II, CAD (CABG 1987), ESRD on peritoneal dialysis, CHF, mild to moderate AS (aortic valve area 1.17 cm2, mean gradient 19.0 mmHg on Echo 02/09/2020), right inguinal hernia scheduled for above procedure 12/03/2020 with Dr. Johnathan Tate.   Per cardiology preoperative evaluation 10/11/2020, "Chart reviewed as part of pre-operative protocol coverage. Patient was contacted 10/11/2020 in reference to pre-operative risk assessment for pending surgery as outlined below.  [name] was last seen on 07/13/20 by [name].  Since that day, [name] has done fine from a cardiac standpoint. He has chronic stable DOE. He feels limited in activity by fatigue but no complaints of chest pain. He can complete 4 METs without anginal complaints.    Therefore, based on ACC/AHA guidelines, the patient would be at acceptable risk for the planned procedure without further cardiovascular testing."  Anticipate pt can proceed with planned procedure barring acute status change.   VS: Ht 5\' 11"  (1.803 m)   Wt 78.5 kg   BMI 24.14 kg/m   PROVIDERS: Nathaniel Gravel, MD is PCP   Nathaniel Bishop, MD is Cardiologist  LABS: Labs reviewed: Acceptable for surgery. (all labs ordered are listed, but only abnormal results are displayed)  Labs Reviewed - No data to display   IMAGES:   EKG: 07/13/2020 Rate 63 bpm  NSR Minimal voltage criteria for LVH, may be normal variant   CV: Echo 02/09/2020  1. Left ventricular ejection fraction, by estimation, is 60 to 65%. Left  ventricular ejection fraction by 3D volume is 65 %. The left ventricle has  normal function. The left ventricle has no  regional wall motion  abnormalities. Left ventricular diastolic   parameters are consistent with Grade I diastolic dysfunction (impaired  relaxation).   2. Right ventricular systolic function is normal. The right ventricular  size is mildly enlarged. There is normal pulmonary artery systolic  pressure. The estimated right ventricular systolic pressure is 34.7 mmHg.   3. Left atrial size was mildly dilated.   4. Right atrial size was mildly dilated.   5. The mitral valve is normal in structure. No evidence of mitral valve  regurgitation. No evidence of mitral stenosis.   6. The aortic valve is calcified. There is moderate calcification of the  aortic valve. There is moderate thickening of the aortic valve. Aortic  valve regurgitation is not visualized. Mild to moderate aortic valve  stenosis. Aortic valve area, by VTI  measures 1.17 cm. Aortic valve mean gradient measures 19.0 mmHg. Aortic  valve Vmax measures 2.99 m/s.   7. The inferior vena cava is normal in size with greater than 50%  respiratory variability, suggesting right atrial pressure of 3 mmHg.  Cardiac Cath 06/21/2017 Prox RCA to Mid RCA lesion is 30% stenosed. Mid RCA lesion is 40% stenosed. Dist RCA lesion is 30% stenosed. Ost LAD to Prox LAD lesion is 100% stenosed. LIMA graft was visualized by angiography and is normal in caliber. Ost Cx to Prox Cx lesion is 90% stenosed. Hemodynamic findings consistent with mild pulmonary hypertension. LV end diastolic pressure is normal.   1. Severe double vessel CAD s/p 3V CABG with 3/3 patent bypass  grafts  2. The LAD has 100% proximal occlusion. The entire LAD fills from the patent LIMA graft. The Diagonal branch fills from the vein graft 3. The Circumflex has 90% proximal stenosis. The vein graft fills the large obtuse marginal graft.  4. The RCA is a large dominant vessel with mild diffuse calcific plaque in the proximal, mid and distal vessel but no obstructive lesions.  5.  The LIMA to the LAD is patent 6 The sequential saphenous vein graft to the Diagonal and OM is patent with mild filling defects seen in the proximal, mid and distal segments of the body of the graft. These lesions do not appear to be flow limiting.  7. Mild aortic stenosis. Peak to peak gradient 4 mmHg. The valve was easily crossed.  8. Right and left heart filling pressures outline above.    Recommendations: Continue medical management of CAD. Continue aggressive blood pressure control and diuresis for diastolic CHF. Will hydrate for 6 hours post cath and plan discharge home later today.   Stress Test 10/07/2014 The left ventricular ejection fraction is normal (55-65%). Nuclear stress EF: 61%. Blood pressure demonstrated a hypertensive response to exercise. Horizontal ST segment depression ST segment depression of 1 mm was noted during stress in the II, III and aVF leads, beginning at 2 minutes of stress, and returning to baseline after less than 1 minute of recovery. This is a low risk study.   Low risk study. No reversible ischemia. Scooped inferior ST segment depression <47mm, likely repolarization abnormality. Normal wall motion. LVEF 61%. Past Medical History:  Diagnosis Date   Aortic stenosis 02/27/2020   mild to moderate AS   Arthritis    "a little in LLE" (06/20/2017)   Chronic diastolic CHF (congestive heart failure) (HCC)    CKD (chronic kidney disease), stage IV ([place])    Coronary artery disease    a. s/p CABG 1980s. b. stable by cath 05/2017.   Dyslipidemia    Edema 08/08/2017   HANDS & LOWER EXTREMITES   Hypertension    Hypoalbuminemia    Nephrotic range proteinuria    Peripheral artery disease (HCC)    mild   Pneumonia ~ 1950 X 1   Prostate cancer ([place]) ~ 2015   Type II diabetes mellitus ([place])     Past Surgical History:  Procedure Laterality Date   CARDIAC CATHETERIZATION     "a couple times" (06/20/2017)   CATARACT EXTRACTION W/ INTRAOCULAR LENS  IMPLANT, BILATERAL  Bilateral    COLONOSCOPY     CORONARY ARTERY BYPASS GRAFT  03/06/1985   LAD and first diagonal/circumflex (sequential saphenous vein graft,cardioplegia   INSERTION PROSTATE RADIATION SEED  ~ 2015   NM MYOCAR PERF WALL MOTION  09/12/2006   no ischemia   RIGHT/LEFT HEART CATH AND CORONARY/GRAFT ANGIOGRAPHY N/A 06/21/2017   Procedure: RIGHT/LEFT HEART CATH AND CORONARY/GRAFT ANGIOGRAPHY;  Surgeon: Burnell Blanks, MD;  Location: [place] [facility];  Service: Cardiovascular;  Laterality: N/A;   US ECHOCARDIOGRAPHY  11/04/2008   trace TR & MR    MEDICATIONS: No current facility-administered medications for this encounter.    acetaminophen (TYLENOL) 650 MG CR tablet   amLODipine (NORVASC) 10 MG tablet   aspirin EC 81 MG tablet   atorvastatin (LIPITOR) 80 MG tablet   carboxymethylcellulose (REFRESH PLUS) 0.5 % SOLN   carvedilol (COREG) 3.125 MG tablet   Cholecalciferol 25 MCG (9470 UT) tablet   folic acid (FOLVITE) 962 MCG tablet   furosemide (LASIX) 80 MG tablet  glimepiride (AMARYL) 4 MG tablet   hydrALAZINE (APRESOLINE) 50 MG tablet   insulin glargine (LANTUS) 100 UNIT/ML injection   KLOR-CON M10 10 MEQ tablet   Multiple Vitamin (MULTIVITAMIN) tablet   nitroGLYCERIN (NITROSTAT) 0.4 MG SL tablet   OVER THE COUNTER MEDICATION   OVER THE COUNTER MEDICATION   TRADJENTA 5 MG TABS tablet   traZODone (DESYREL) 50 MG tablet    [facility] Ward, PA-C [facility] Pre-Surgical Testing [phone]

## 2020-11-25 NOTE — Anesthesia Preprocedure Evaluation (Addendum)
Anesthesia Evaluation  Patient identified by MRN, date of birth, ID band Patient awake    Reviewed: Allergy & Precautions, NPO status , Patient's Chart, lab work & pertinent test results, reviewed documented beta blocker date and time   History of Anesthesia Complications Negative for: history of anesthetic complications  Airway Mallampati: II  TM Distance: >3 FB Neck ROM: Full    Dental  (+) Missing,    Pulmonary neg pulmonary ROS, former smoker,    Pulmonary exam normal        Cardiovascular hypertension, Pt. on medications and Pt. on home beta blockers + CAD, + CABG (1980s), + Peripheral Vascular Disease and +CHF  Normal cardiovascular exam  TTE 01/2020: EF 60-65%, grade I DD, RV mildly enlarged, mild RAE/LAE, mild to moderate AS   Neuro/Psych negative neurological ROS  negative psych ROS   GI/Hepatic negative GI ROS, Neg liver ROS,   Endo/Other  diabetes, Type 2, Insulin Dependent, Oral Hypoglycemic Agents  Renal/GU Renal InsufficiencyRenal disease (Cr 4.9, K 3.8)  negative genitourinary   Musculoskeletal negative musculoskeletal ROS (+)   Abdominal   Peds  Hematology negative hematology ROS (+)   Anesthesia Other Findings Day of surgery medications reviewed with patient.  Reproductive/Obstetrics negative OB ROS                            Anesthesia Physical Anesthesia Plan  ASA: 3  Anesthesia Plan: General   Post-op Pain Management:    Induction: Intravenous  PONV Risk Score and Plan: 2 and Treatment may vary due to age or medical condition, Ondansetron and Dexamethasone  Airway Management Planned: LMA  Additional Equipment: None  Intra-op Plan:   Post-operative Plan: Extubation in OR  Informed Consent: I have reviewed the patients History and Physical, chart, labs and discussed the procedure including the risks, benefits and alternatives for the proposed anesthesia with  the patient or authorized representative who has indicated his/her understanding and acceptance.     Dental advisory given  Plan Discussed with: CRNA  Anesthesia Plan Comments: (See PAT note 11/25/2020, Konrad Felix Ward, PA-C)       Anesthesia Quick Evaluation

## 2020-12-03 NOTE — Progress Notes (Addendum)
Spoke to patient's wife and gave updated arrival time of 1315 for procedure on 12/06/20.  NPO status given of 1230.  No change in medical history.  Last dose of Aspirin 12-02-20.  Dexcom sensor remains on L abdomen.

## 2020-12-05 NOTE — H&P (Signed)
Chief Complaint: Hernia   History of Present Illness: [name] is a 87 y.o. male who is seen today as an office consultation at the request of Dr. Hollie Salk for evaluation of Hernia .   Mr. and Mrs. Baumler came in for a consultation regarding his relatively new right inguinal hernia. He has an egg shaped bulge in the right inguinal region and currently wears a hernia truss. He is also on chronic peritoneal dialysis. The procedure was discussed with him and was rescheduled from last Friday  Review of Systems: See HPI as well for other ROS.  ROS   Medical History: Past Medical History:  Diagnosis Date   Arthritis   Chronic kidney disease   Diabetes mellitus type 2, uncomplicated (CMS-HCC)  Type II   Encounter for long-term (current) use of medications   Prostate cancer (CMS-HCC)   Patient Active Problem List  Diagnosis   Rheumatoid arthritis, seropositive (CMS-HCC)   Encounter for long-term (current) use of high-risk medication   Stage 4 chronic kidney disease (CMS-HCC)   Primary osteoarthritis of left knee   Diabetes mellitus type 2, uncomplicated (CMS-HCC)   Numbness in feet   Trigger middle finger of left hand   Dizziness, nonspecific   Chronic kidney disease (CKD), stage IV (severe) (CMS-HCC)   Chronic midline low back pain without sciatica   Past Surgical History:  Procedure Laterality Date   cataracts   FRACTURE SURGERY  Left arm and left leg   PROSTATE SURGERY    No Known Allergies  Current Outpatient Medications on File Prior to Visit  Medication Sig Dispense Refill   amLODIPine (NORVASC) 10 MG tablet Take 10 mg by mouth once daily   aspirin 81 MG chewable tablet Take 81 mg by mouth once daily   atorvastatin (LIPITOR) 40 MG tablet Take 40 mg by mouth once daily. 3   carboxymethylcellulose (REFRESH TEARS) 0.5 % ophthalmic solution Place 1-2 drops into both eyes as needed for Dry Eyes   carvediloL (COREG) 3.125 MG tablet Take by mouth 2 (two)  times daily   cholecalciferol (CHOLECALCIFEROL) 1,000 unit tablet Take 1,000 Units by mouth once daily   COQ10, UBIQUINOL, ORAL Take by mouth once daily   diclofenac (VOLTAREN) 1 % topical gel Apply 2 g topically 4 (four) times daily 703 g 3   folic acid (FOLVITE) 1 MG tablet Take by mouth.   FUROsemide (LASIX) 40 MG tablet Take 40 mg by mouth 2 (two) times daily 99   glimepiride (AMARYL) 4 MG tablet Take 4 mg by mouth once daily 99   hydrALAZINE (APRESOLINE) 100 MG tablet Take 100 mg by mouth 3 (three) times daily. 12   isosorbide mononitrate (IMDUR) 30 MG ER tablet Take 30 mg by mouth once daily   lactulose (ENULOSE) 20 gram/30 mL solution Take by mouth once daily   linagliptin (TRADJENTA) tablet Take by mouth once daily   multivitamin tablet Take 1 tablet by mouth once daily   ONETOUCH ULTRA TEST test strip TEST 1-2 TIMES PER DAY 99   ONETOUCH ULTRASOFT lancets USE TO TEST BLOOD SUGAR 1-2 TIMES DAILY 99   traZODone (DESYREL) 50 MG tablet Take 50 mg by mouth nightly as needed for Sleep   No current facility-administered medications on file prior to visit.   Family History  Problem Relation Age of Onset   Diabetes Mother    Social History   Tobacco Use  Smoking Status Never Smoker  Smokeless Tobacco Never Used    Social History  Socioeconomic History   Marital status: Married  Occupational History   Occupation: Retired  Tobacco Use   Smoking status: Never Smoker   Smokeless tobacco: Never Used  Scientific laboratory technician Use: Never used  Substance and Sexual Activity   Alcohol use: No   Sexual activity: Defer   Objective:   Vitals:  BP: 122/70  Pulse: 68  Weight: 79.1 kg (174 lb 6.4 oz)  Height: 180.3 cm (5\' 11" )   Body mass index is 24.32 kg/m.  Physical Exam General: Normally developed African-American man who looks good for 25 years HEENT: Unremarkable Chest: Clear Heart: 3/6 systolic ejection murmur Breast: Not examined Abdomen: Peritoneal dialysis  catheter in place GU prominent right inguinal hernia, no left inguinal hernia Rectal not performed Extremities range of motion may be limited by arthritis. Patient has had injections in his knees particularly the left Neuro alert and oriented x3. Motor and sensory function grossly intact.  Labs, Imaging and Diagnostic Testing: None  Assessment and Plan:   ESRD on peritoneal dialysis (CMS-HCC)  Right inguinal hernia for open repair.     I have explained inguinal hernias and since he is on peritoneal dialysis I do not think he is a good candidate for a transabdominal preperitoneal hernia repair. I think he would better be served with an open right inguinal hernia with mesh. I discussed the complications of leakage of dialysis. We will move up with scheduling for an open right inguinal hernia at [facility] under general anesthesia. I would like to get cardiac clearance prior to that time.  No follow-ups on file.  Kahliyah Dick Donia Pounds, MD

## 2020-12-06 ENCOUNTER — Ambulatory Visit (HOSPITAL_COMMUNITY)
Admission: RE | Admit: 2020-12-06 | Discharge: 2020-12-06 | Disposition: A | Discharge status: Home / Self Care | Payer: Medicare Other | Attending: Surgery | Admitting: Surgery

## 2020-12-06 ENCOUNTER — Ambulatory Visit (HOSPITAL_COMMUNITY): Payer: Medicare Other | Admitting: Physician Assistant

## 2020-12-06 ENCOUNTER — Encounter (HOSPITAL_COMMUNITY): Payer: Self-pay | Admitting: Surgery

## 2020-12-06 ENCOUNTER — Encounter (HOSPITAL_COMMUNITY)
Admission: RE | Disposition: A | Discharge status: Home / Self Care | Payer: Self-pay | Source: Home / Self Care | Attending: Surgery

## 2020-12-06 DIAGNOSIS — E785 Hyperlipidemia, unspecified: Secondary | ICD-10-CM | POA: Insufficient documentation

## 2020-12-06 DIAGNOSIS — Z8546 Personal history of malignant neoplasm of prostate: Secondary | ICD-10-CM | POA: Diagnosis not present

## 2020-12-06 DIAGNOSIS — Z992 Dependence on renal dialysis: Secondary | ICD-10-CM | POA: Insufficient documentation

## 2020-12-06 DIAGNOSIS — K4090 Unilateral inguinal hernia, without obstruction or gangrene, not specified as recurrent: Secondary | ICD-10-CM | POA: Diagnosis not present

## 2020-12-06 DIAGNOSIS — I5032 Chronic diastolic (congestive) heart failure: Secondary | ICD-10-CM | POA: Diagnosis not present

## 2020-12-06 DIAGNOSIS — Z87891 Personal history of nicotine dependence: Secondary | ICD-10-CM | POA: Diagnosis not present

## 2020-12-06 DIAGNOSIS — Z7982 Long term (current) use of aspirin: Secondary | ICD-10-CM | POA: Diagnosis not present

## 2020-12-06 DIAGNOSIS — M545 Low back pain, unspecified: Secondary | ICD-10-CM | POA: Insufficient documentation

## 2020-12-06 DIAGNOSIS — Z7984 Long term (current) use of oral hypoglycemic drugs: Secondary | ICD-10-CM | POA: Diagnosis not present

## 2020-12-06 DIAGNOSIS — I132 Hypertensive heart and chronic kidney disease with heart failure and with stage 5 chronic kidney disease, or end stage renal disease: Secondary | ICD-10-CM | POA: Insufficient documentation

## 2020-12-06 DIAGNOSIS — G8929 Other chronic pain: Secondary | ICD-10-CM | POA: Diagnosis not present

## 2020-12-06 DIAGNOSIS — Z79899 Other long term (current) drug therapy: Secondary | ICD-10-CM | POA: Insufficient documentation

## 2020-12-06 DIAGNOSIS — E1122 Type 2 diabetes mellitus with diabetic chronic kidney disease: Secondary | ICD-10-CM | POA: Insufficient documentation

## 2020-12-06 DIAGNOSIS — N186 End stage renal disease: Secondary | ICD-10-CM | POA: Insufficient documentation

## 2020-12-06 DIAGNOSIS — Z951 Presence of aortocoronary bypass graft: Secondary | ICD-10-CM | POA: Diagnosis not present

## 2020-12-06 DIAGNOSIS — I2510 Atherosclerotic heart disease of native coronary artery without angina pectoris: Secondary | ICD-10-CM | POA: Insufficient documentation

## 2020-12-06 HISTORY — PX: INGUINAL HERNIA REPAIR: SHX194

## 2020-12-06 LAB — BASIC METABOLIC PANEL
Anion gap: 8 (ref 5–15)
BUN: 69 mg/dL — ABNORMAL HIGH (ref 8–23)
CO2: 27 mmol/L (ref 22–32)
Calcium: 9 mg/dL (ref 8.9–10.3)
Chloride: 102 mmol/L (ref 98–111)
Creatinine, Ser: 4.9 mg/dL — ABNORMAL HIGH (ref 0.61–1.24)
GFR, Estimated: 11 mL/min — ABNORMAL LOW (ref >60)
Glucose, Bld: 188 mg/dL — ABNORMAL HIGH (ref 70–99)
Potassium: 3.8 mmol/L (ref 3.5–5.1)
Sodium: 137 mmol/L (ref 135–145)

## 2020-12-06 LAB — CBC
HCT: 39.8 % (ref 39.0–52.0)
Hemoglobin: 13 g/dL (ref 13.0–17.0)
MCH: 32.6 pg (ref 26.0–34.0)
MCHC: 32.7 g/dL (ref 30.0–36.0)
MCV: 99.7 fL (ref 80.0–100.0)
Platelets: 190 10*3/uL (ref 150–400)
RBC: 3.99 MIL/uL — ABNORMAL LOW (ref 4.22–5.81)
RDW: 13 % (ref 11.5–15.5)
WBC: 7.6 10*3/uL (ref 4.0–10.5)
nRBC: 0 % (ref 0.0–0.2)

## 2020-12-06 LAB — GLUCOSE, CAPILLARY
Glucose-Capillary: 184 mg/dL — ABNORMAL HIGH (ref 70–99)
Glucose-Capillary: 138 mg/dL — ABNORMAL HIGH (ref 70–99)

## 2020-12-06 LAB — HEMOGLOBIN A1C
Hgb A1c MFr Bld: 7.6 % — ABNORMAL HIGH (ref 4.8–5.6)
Mean Plasma Glucose: 171.42 mg/dL

## 2020-12-06 SURGERY — REPAIR, HERNIA, INGUINAL, ADULT
Anesthesia: General | Site: Groin | Laterality: Right

## 2020-12-06 MED ORDER — ONDANSETRON HCL 4 MG/2ML IJ SOLN
INTRAMUSCULAR | Status: DC | PRN
  Administered 2020-12-06: 4 mg via INTRAVENOUS

## 2020-12-06 MED ORDER — CHLORHEXIDINE GLUCONATE 0.12 % MT SOLN
15.0000 mL | Freq: Once | OROMUCOSAL | Status: AC
  Administered 2020-12-06: 15 mL via OROMUCOSAL

## 2020-12-06 MED ORDER — PROPOFOL 10 MG/ML IV BOLUS
INTRAVENOUS | Status: DC | PRN
  Administered 2020-12-06: 130 mg via INTRAVENOUS

## 2020-12-06 MED ORDER — LIDOCAINE 2% (20 MG/ML) 5 ML SYRINGE
INTRAMUSCULAR | Status: DC | PRN
  Administered 2020-12-06: 100 mg via INTRAVENOUS

## 2020-12-06 MED ORDER — ROCURONIUM BROMIDE 10 MG/ML (PF) SYRINGE
PREFILLED_SYRINGE | INTRAVENOUS | Status: AC
  Filled 2020-12-06: qty 10

## 2020-12-06 MED ORDER — CHLORHEXIDINE GLUCONATE CLOTH 2 % EX PADS: 6.0000 | MEDICATED_PAD | Freq: Once | CUTANEOUS | Status: DC

## 2020-12-06 MED ORDER — FENTANYL CITRATE (PF) 100 MCG/2ML IJ SOLN
INTRAMUSCULAR | Status: DC | PRN
  Administered 2020-12-06: 75 ug via INTRAVENOUS
  Administered 2020-12-06: 50 ug via INTRAVENOUS
  Administered 2020-12-06: 25 ug via INTRAVENOUS

## 2020-12-06 MED ORDER — OXYCODONE HCL 5 MG PO TABS: 5.0000 mg | ORAL_TABLET | Freq: Once | ORAL | Status: DC | PRN

## 2020-12-06 MED ORDER — PROPOFOL 10 MG/ML IV BOLUS
INTRAVENOUS | Status: AC
  Filled 2020-12-06: qty 20

## 2020-12-06 MED ORDER — OXYCODONE HCL 5 MG/5ML PO SOLN: 5.0000 mg | Freq: Once | ORAL | Status: DC | PRN

## 2020-12-06 MED ORDER — ROCURONIUM BROMIDE 10 MG/ML (PF) SYRINGE
PREFILLED_SYRINGE | INTRAVENOUS | Status: DC | PRN
  Administered 2020-12-06: 40 mg via INTRAVENOUS

## 2020-12-06 MED ORDER — 0.9 % SODIUM CHLORIDE (POUR BTL) OPTIME
TOPICAL | Status: DC | PRN
  Administered 2020-12-06: 1000 mL

## 2020-12-06 MED ORDER — BUPIVACAINE HCL (300 MG DOSE) 3 X 100 MG IL IMPL
300.0000 mg | DRUG_IMPLANT | Freq: Once | Status: AC
  Administered 2020-12-06: 300 mg
  Filled 2020-12-06: qty 300

## 2020-12-06 MED ORDER — SUGAMMADEX SODIUM 200 MG/2ML IV SOLN
INTRAVENOUS | Status: DC | PRN
  Administered 2020-12-06: 200 mg via INTRAVENOUS

## 2020-12-06 MED ORDER — BUPIVACAINE LIPOSOME 1.3 % IJ SUSP: 20.0000 mL | Freq: Once | INTRAMUSCULAR | Status: DC

## 2020-12-06 MED ORDER — CEFAZOLIN SODIUM-DEXTROSE 2-4 GM/100ML-% IV SOLN
2.0000 g | INTRAVENOUS | Status: AC
  Administered 2020-12-06: 2 g via INTRAVENOUS
  Filled 2020-12-06: qty 100

## 2020-12-06 MED ORDER — DEXAMETHASONE SODIUM PHOSPHATE 10 MG/ML IJ SOLN
INTRAMUSCULAR | Status: AC
  Filled 2020-12-06: qty 1

## 2020-12-06 MED ORDER — EPHEDRINE 5 MG/ML INJ
INTRAVENOUS | Status: AC
  Filled 2020-12-06: qty 5

## 2020-12-06 MED ORDER — ACETAMINOPHEN 500 MG PO TABS
1000.0000 mg | ORAL_TABLET | ORAL | Status: AC
  Administered 2020-12-06: 1000 mg via ORAL
  Filled 2020-12-06: qty 2

## 2020-12-06 MED ORDER — ORAL CARE MOUTH RINSE: 15.0000 mL | Freq: Once | OROMUCOSAL | Status: AC

## 2020-12-06 MED ORDER — PHENYLEPHRINE HCL (PRESSORS) 10 MG/ML IV SOLN
INTRAVENOUS | Status: AC
  Filled 2020-12-06: qty 2

## 2020-12-06 MED ORDER — HYDROCODONE-ACETAMINOPHEN 5-325 MG PO TABS: 1.0000 | ORAL_TABLET | Freq: Four times a day (QID) | ORAL | 0 refills | Status: DC | PRN

## 2020-12-06 MED ORDER — ONDANSETRON HCL 4 MG/2ML IJ SOLN
INTRAMUSCULAR | Status: AC
  Filled 2020-12-06: qty 2

## 2020-12-06 MED ORDER — SODIUM CHLORIDE 0.9 % IV SOLN
INTRAVENOUS | Status: DC | PRN
Start: 2020-12-06 — End: 2020-12-06

## 2020-12-06 MED ORDER — LACTATED RINGERS IV SOLN: INTRAVENOUS | Status: DC

## 2020-12-06 MED ORDER — LIDOCAINE HCL (PF) 2 % IJ SOLN
INTRAMUSCULAR | Status: AC
  Filled 2020-12-06: qty 5

## 2020-12-06 MED ORDER — FENTANYL CITRATE (PF) 250 MCG/5ML IJ SOLN
INTRAMUSCULAR | Status: AC
  Filled 2020-12-06: qty 5

## 2020-12-06 MED ORDER — FENTANYL CITRATE PF 50 MCG/ML IJ SOSY: 25.0000 ug | PREFILLED_SYRINGE | INTRAMUSCULAR | Status: DC | PRN

## 2020-12-06 SURGICAL SUPPLY — 36 items
BAG COUNTER SPONGE SURGICOUNT (BAG) IMPLANT
BLADE SURG 15 STRL LF DISP TIS (BLADE) ×1 IMPLANT
BLADE SURG 15 STRL SS (BLADE) ×2
COVER SURGICAL LIGHT HANDLE (MISCELLANEOUS) ×2 IMPLANT
DECANTER SPIKE VIAL GLASS SM (MISCELLANEOUS) IMPLANT
DERMABOND ADVANCED (GAUZE/BANDAGES/DRESSINGS) ×1
DERMABOND ADVANCED .7 DNX12 (GAUZE/BANDAGES/DRESSINGS) ×1 IMPLANT
DISSECTOR ROUND CHERRY 3/8 STR (MISCELLANEOUS) IMPLANT
DRAIN PENROSE 0.5X18 (DRAIN) ×2 IMPLANT
DRAPE LAPAROTOMY TRNSV 102X78 (DRAPES) ×2 IMPLANT
ELECT REM PT RETURN 15FT ADLT (MISCELLANEOUS) ×2 IMPLANT
GLOVE SURG ENC MOIS LTX SZ7.5 (GLOVE) ×4 IMPLANT
GLOVE SURG ENC TEXT LTX SZ8 (GLOVE) ×2 IMPLANT
GLOVE SURG POLY ORTHO LF SZ7.5 (GLOVE) ×2 IMPLANT
GLOVE SURG UNDER POLY LF SZ7.5 (GLOVE) ×4 IMPLANT
GOWN STRL REUS W/TWL XL LVL3 (GOWN DISPOSABLE) ×6 IMPLANT
KIT BASIN OR (CUSTOM PROCEDURE TRAY) ×2 IMPLANT
KIT TURNOVER KIT A (KITS) ×2 IMPLANT
MESH HERNIA 3X6 (Mesh General) ×2 IMPLANT
NEEDLE HYPO 22GX1.5 SAFETY (NEEDLE) IMPLANT
PACK BASIC VI WITH GOWN DISP (CUSTOM PROCEDURE TRAY) ×2 IMPLANT
PENCIL SMOKE EVACUATOR (MISCELLANEOUS) ×2 IMPLANT
SPONGE T-LAP 4X18 ~~LOC~~+RFID (SPONGE) ×2 IMPLANT
STAPLER VISISTAT 35W (STAPLE) IMPLANT
SUT MNCRL AB 4-0 PS2 18 (SUTURE) ×2 IMPLANT
SUT PROLENE 2 0 CT2 30 (SUTURE) ×6 IMPLANT
SUT SILK 2 0 SH (SUTURE) IMPLANT
SUT VIC AB 2-0 SH 27 (SUTURE) ×6
SUT VIC AB 2-0 SH 27X BRD (SUTURE) ×2 IMPLANT
SUT VIC AB 2-0 SH 27XBRD (SUTURE) ×1 IMPLANT
SUT VIC AB 4-0 SH 18 (SUTURE) ×2 IMPLANT
SUT VICRYL 4-0 (SUTURE) ×2 IMPLANT
SYR 20ML LL LF (SYRINGE) IMPLANT
SYR BULB IRRIG 60ML STRL (SYRINGE) ×2 IMPLANT
TOWEL OR 17X26 10 PK STRL BLUE (TOWEL DISPOSABLE) ×2 IMPLANT
TOWEL OR NON WOVEN STRL DISP B (DISPOSABLE) ×2 IMPLANT

## 2020-12-06 NOTE — Op Note (Signed)
[name]  Nov 28, 1933   12/06/2020    PCP:  Jani Gravel, MD   Surgeon: Kaylyn Lim, MD, FACS  Asst:  Genella Mech, MD  I, Dr. Hassell Done  was personally present during the entire case and and all portions of this procedure and immediately available throughout the entire procedure, as documented in my operative note.   Anes:  General and Xaracoll wafers  Preop Dx: Right inguinal hernia and peritoneal dialysis Postop Dx: Right indirect inguinal hernia  Procedure: Open right inguinal hernia with Bard Marlex type mesh Location Surgery: [facility] OR 2 Complications: none  EBL:   minimal cc  Drains: none  Description of Procedure:  The patient was taken to OR 2 .  After anesthesia was administered and the patient was prepped  with [employer]  and a timeout was performed.  An oblique incision was made over the inguinal canal.  This was carried down to the external oblique which was incised along the fibers.  The anterior nerve along the cord structures was retracted with the lateral flap.  Once the cord was mobilized we explored it proximally and found the sac opened it up.  You could feel the peritoneal caliber catheter inside and we distally used it to dissected from the other cord structures and then carried it up separating it from the vas deferens and then ultimately performing a high ligation just with a tie and then outward from that we did a suture ligature both with 2-0 Vicryl.  A piece of Bard Marlex type mesh was cut to fit the canal floor and sewn in place with a running 2-0 Prolene along the inguinal ligament.  Medially was sutured to the internal oblique and then the 2 leaves came around the cord structures and they sutured to themselves.  The 0 call wafers were then placed on top of the mesh and then the external oblique was closed over these structures.  Some 0 call wafers were placed on top of this and then the wound was then closed in 3 layers after irrigating and then closed with a  running of 4-0 Monocryl and Dermabond.  Patient seemed to tolerate the procedure well was taken recovery in satisfactory condition.    The patient tolerated the procedure well and was taken to the PACU in stable condition.     Matt B. Hassell Done, [place], [facility], [place]

## 2020-12-06 NOTE — Transfer of Care (Signed)
Immediate Anesthesia Transfer of Care Note  [name]  Procedure(s) Performed: OPEN RIGHT INGUINAL HERNIA REPAIR (Right: Groin)  Patient Location: PACU  Anesthesia Type:General  Level of Consciousness: awake, alert , oriented and patient cooperative  Airway & Oxygen Therapy: Patient Spontanous Breathing and Patient connected to face mask oxygen  Post-op Assessment: Report given to RN, Post -op Vital signs reviewed and stable and Patient moving all extremities X 4  Post vital signs: stable  Last Vitals:  Vitals Value Taken Time  BP 132/68 12/06/20 1845  Temp    Pulse 60 12/06/20 1846  Resp 13 12/06/20 1846  SpO2 100 % 12/06/20 1846  Vitals shown include unvalidated device data.  Last Pain:  Vitals:   12/06/20 1256  TempSrc:   PainSc: 0-No pain         Complications: No notable events documented.

## 2020-12-06 NOTE — Progress Notes (Signed)
Patient was informed that his doctor has been delayed on a previous case and his surgery will probably not start until at least 1530-1600 today.  Apologized to patient, patient voiced understanding.

## 2020-12-06 NOTE — Interval H&P Note (Signed)
History and Physical Interval Note:  12/06/2020 4:27 PM  [name]  has presented today for surgery, with the diagnosis of RIGHT INGUINAL HERNIA, SYMPTOMATIC.  The various methods of treatment have been discussed with the patient and family. After consideration of risks, benefits and other options for treatment, the patient has consented to  Procedure(s): OPEN RIGHT INGUINAL HERNIA REPAIR (Right) as a surgical intervention.  The patient's history has been reviewed, patient examined, no change in status, stable for surgery.  I have reviewed the patient's chart and labs.  Questions were answered to the patient's satisfaction.     [name]

## 2020-12-06 NOTE — Anesthesia Procedure Notes (Signed)
Procedure Name: Intubation Date/Time: 12/06/2020 5:12 PM Performed by: Lissa Morales, CRNA Pre-anesthesia Checklist: Patient identified, Emergency Drugs available, Suction available and Patient being monitored Patient Re-evaluated:Patient Re-evaluated prior to induction Oxygen Delivery Method: Circle system utilized Preoxygenation: Pre-oxygenation with 100% oxygen Induction Type: IV induction Ventilation: Mask ventilation without difficulty and Oral airway inserted - appropriate to patient size Laryngoscope Size: Mac and 4 Tube type: Oral Tube size: 8.0 mm Number of attempts: 1 Airway Equipment and Method: Stylet and Oral airway Placement Confirmation: ETT inserted through vocal cords under direct vision, positive ETCO2 and breath sounds checked- equal and bilateral Secured at: 23 cm Tube secured with: Tape Dental Injury: Teeth and Oropharynx as per pre-operative assessment

## 2020-12-07 ENCOUNTER — Other Ambulatory Visit: Payer: Self-pay | Admitting: *Deleted

## 2020-12-07 ENCOUNTER — Encounter (HOSPITAL_COMMUNITY): Payer: Self-pay | Admitting: Surgery

## 2020-12-07 NOTE — Patient Outreach (Addendum)
[facility]) Care Management  12/07/2020  [name] [birth year] [id number]  Nurse answered an incoming call from the patient's wife [name]. Wife informed nurse that the patient is doing well post operatively after his hernia surgery. [name] states that his incision looks well and that the patient continues to feel well supported by Dr. Hassell Done and the surgical care team. Patient has his post surgery appointment scheduled with Dr. Hassell Done on 12/31/20. Nurse will close patient's case due to him no longer being eligible for the [facility] program.   Plan: [facility] will close patient's case and will send both the PCP and Patient a case closed letter.   Emelia Loron RN, [place] [phone] [email or web address]

## 2020-12-07 NOTE — Anesthesia Postprocedure Evaluation (Signed)
Anesthesia Post Note  [name]  Procedure(s) Performed: OPEN RIGHT INGUINAL HERNIA REPAIR (Right: Groin)     Patient location during evaluation: PACU Anesthesia Type: General Level of consciousness: awake and alert and oriented Pain management: pain level controlled Vital Signs Assessment: post-procedure vital signs reviewed and stable Respiratory status: spontaneous breathing, nonlabored ventilation and respiratory function stable Cardiovascular status: blood pressure returned to baseline Postop Assessment: no apparent nausea or vomiting Anesthetic complications: no   No notable events documented.  Last Vitals:  Vitals:   12/06/20 1930 12/06/20 2017  BP: (!) 141/69 132/74  Pulse: 67 64  Resp: 16 16  Temp: 36.6 C 36.6 C  SpO2: 100% 100%    Last Pain:  Vitals:   12/06/20 2017  TempSrc:   PainSc: 0-No pain                 Marthenia Rolling

## 2021-02-08 ENCOUNTER — Other Ambulatory Visit: Payer: Self-pay

## 2021-02-08 ENCOUNTER — Ambulatory Visit (HOSPITAL_COMMUNITY): Payer: Medicare Other | Attending: Internal Medicine

## 2021-02-08 DIAGNOSIS — I350 Nonrheumatic aortic (valve) stenosis: Secondary | ICD-10-CM | POA: Diagnosis not present

## 2021-02-08 LAB — ECHOCARDIOGRAM COMPLETE
AR max vel: 1.1 cm2
AV Area VTI: 1.37 cm2
AV Area mean vel: 1.21 cm2
AV Mean grad: 27 mmHg
AV Peak grad: 51.6 mmHg
Ao pk vel: 3.59 m/s
Area-P 1/2: 3.06 cm2
S' Lateral: 3.2 cm

## 2021-02-08 NOTE — Progress Notes (Signed)
Cardiology Office Note:    Date:  02/09/2021   ID:  [name], DOB 07/16/1933, MRN [id number]  PCP:  Nathaniel Gravel, MD  Cardiologist:  Pixie Casino, MD  Primary APP: Fabian Sharp PA-C  Referring MD: Nathaniel Gravel, MD   Chief Complaint  Patient presents with   Follow-up   AS  History of Present Illness:    [name] is a 87 y.o. male with a hx of CAD s/p CABG in 1987, [place], HLD, DM, mild carotid artery stenosis, AS, and PAD. He has a history of prostate cancer, suspected early dementia, RA, and CKD followed by nephrology and now on PD.  He has a pattern of not taking medication prior to appointments. Nonischemic nuclear stress test in 2016. He has significant daytime fatigue when he takes clonidine in the morning. Starting in 2018, he has struggled with volume management with hospitalizations for CHF exacerbation. Echo with preserved EF and mild to moderate AS. Right and left heart cath 05/2017 showed native disease with 3/3 patent grafts. Aortic stenosis was mild at that time. He has PAD with mild improvement in his last ABIs. Crestor stopped for "brain fog" without significant improvement. Concern for RA and placed on prednisone. Bilateral carotid dopplers with mild nonobstructive plaque. He follows with nephrology and has transitioned to peritoneal dialysis.    At his last visit 12/2019, he reported episodes of dizziness concerning for hypotension vs reduced cardiac output. Exam consistent with moderate to severe AS. Repeat echo 02/09/20 showed normal EF, mild diastolic dysfunction, mild to moderate AS, and improved pulmonary pressures. I saw him in follow up in March 2022 and ordered lower extremity dopplers. He was subsequently referred to Dr. Gwenlyn Found for PAD who did not feel he had critical limb ischemia or lifestyle limiting claudication and recommended medical therapy.  He was seen back with Dr. Debara Pickett in May 2022 and was doing well.  Changes to his dialys improved his  dizziness.  He presents today for 6 month follow up. He denies claudication today.   He had a repeat echo yesterday and his aortic stenosis is now moderate. He has noticed an improvement in fatigue and DOE when he started co-Q10 and B complex. He can now walk in 5 minute increments in the house. He does this several times daily. He states he wants to be able to walk more, but is comfortable re-evaluating his valve in 6 months. I had to explain things several times today and wonder if his dementia is progressing - I do not remember this degree of confusion at his prior visit.   Past Medical History:  Diagnosis Date   Aortic stenosis 02/27/2020   mild to moderate AS   Arthritis    "a little in LLE" (06/20/2017)   Chronic diastolic CHF (congestive heart failure) (HCC)    CKD (chronic kidney disease), stage IV ([place])    Coronary artery disease    a. s/p CABG 1980s. b. stable by cath 05/2017.   Dyslipidemia    Edema 08/08/2017   HANDS & LOWER EXTREMITES   Hypertension    Hypoalbuminemia    Nephrotic range proteinuria    Peripheral artery disease (HCC)    mild   Pneumonia ~ 1950 X 1   Prostate cancer ([place]) ~ 2015   Type II diabetes mellitus ([place])     Past Surgical History:  Procedure Laterality Date   CARDIAC CATHETERIZATION     "a couple times" (06/20/2017)   CATARACT EXTRACTION W/ INTRAOCULAR  LENS  IMPLANT, BILATERAL Bilateral    COLONOSCOPY     CORONARY ARTERY BYPASS GRAFT  03/06/1985   LAD and first diagonal/circumflex (sequential saphenous vein graft,cardioplegia   INGUINAL HERNIA REPAIR Right 12/06/2020   Procedure: OPEN RIGHT INGUINAL HERNIA REPAIR;  Surgeon: Johnathan Hausen, MD;  Location: [facility]S;  Service: General;  Laterality: Right;   INSERTION PROSTATE RADIATION SEED  ~ 2015   NM MYOCAR PERF WALL MOTION  09/12/2006   no ischemia   RIGHT/LEFT HEART CATH AND CORONARY/GRAFT ANGIOGRAPHY N/A 06/21/2017   Procedure: RIGHT/LEFT HEART CATH AND CORONARY/GRAFT ANGIOGRAPHY;  Surgeon:  Burnell Blanks, MD;  Location: [place] CV LAB;  Service: Cardiovascular;  Laterality: N/A;   US ECHOCARDIOGRAPHY  11/04/2008   trace TR & MR    Current Medications: Current Meds  Medication Sig   amLODipine (NORVASC) 10 MG tablet Take 10 mg by mouth at bedtime.   aspirin EC 81 MG tablet Take 81 mg by mouth daily.   atorvastatin (LIPITOR) 80 MG tablet TAKE 0.5 TABLETS (40 MG TOTAL) BY MOUTH AT BEDTIME.   Azelastine HCl 0.15 % SOLN Place 1 spray into both nostrils daily as needed (allergies).   B Complex-C (B COMPLEX-VITAMIN C) CAPS Take by mouth.   carvedilol (COREG) 3.125 MG tablet TAKE 1 TABLET (3.125 MG TOTAL) BY MOUTH 2 (TWO) TIMES DAILY WITH A MEAL.   folic acid (FOLVITE) 466 MCG tablet Take 800 mcg by mouth daily.   furosemide (LASIX) 80 MG tablet TAKE 1 TABLET (80 MG TOTAL) BY MOUTH 2 (TWO) TIMES DAILY AT 10 AM AND 5 PM.   glimepiride (AMARYL) 4 MG tablet Take 4 mg by mouth daily as needed (diabetes).   hydrALAZINE (APRESOLINE) 50 MG tablet Take 1 tablet (50 mg total) by mouth in the morning and at bedtime.   HYDROcodone-acetaminophen (NORCO/VICODIN) 5-325 MG tablet Take 1 tablet by mouth every 6 (six) hours as needed for moderate pain.   insulin glargine (LANTUS) 100 UNIT/ML injection Inject 13 Units into the skin at bedtime.   KLOR-CON M10 10 MEQ tablet TAKE 1 TABLET BY MOUTH EVERY DAY   Multiple Vitamin (MULTIVITAMIN) tablet Take 1 tablet by mouth daily.    nitroGLYCERIN (NITROSTAT) 0.4 MG SL tablet Place 1 tablet (0.4 mg total) under the tongue every 5 (five) minutes x 3 doses as needed for chest pain.   OVER THE COUNTER MEDICATION Place 1 spray into the nose daily as needed. Sovereign Silver Nasal Spray for Immune Support   Propylene Glycol (SYSTANE COMPLETE) 0.6 % SOLN Place 1 drop into both eyes daily as needed (Dry eye).   TRADJENTA 5 MG TABS tablet Take 5 mg by mouth every morning.    traZODone (DESYREL) 50 MG tablet Take 50 mg by mouth at bedtime as needed for  sleep.      Allergies:   Patient has no known allergies.   Social History   Socioeconomic History   Marital status: Married    Spouse name: [name]   Number of children: Not on file   Years of education: Not on file   Highest education level: Not on file  Occupational History   Occupation: Retired  Tobacco Use   Smoking status: Former    Packs/day: 1.00    Years: 20.00    Pack years: 20.00    Types: Cigarettes    Quit date: 08/22/1972    Years since quitting: 48.5   Smokeless tobacco: Never  Vaping Use   Vaping Use: Never used  Substance and Sexual Activity   Alcohol use: Not Currently    Comment: 06/20/2017 "nothing since ~ 1974"   Drug use: No   Sexual activity: Not Currently  Other Topics Concern   Not on file  Social History Narrative   Not on file   Social Determinants of Health   Financial Resource Strain: Not on file  Food Insecurity: Not on file  Transportation Needs: Not on file  Physical Activity: Not on file  Stress: Not on file  Social Connections: Not on file     Family History: The patient's family history includes Heart attack in his mother.  ROS:   Please see the history of present illness.     All other systems reviewed and are negative.  EKGs/Labs/Other Studies Reviewed:    The following studies were reviewed today:  Echo 02/08/21:  1. Left ventricular ejection fraction, by estimation, is 60 to 65%. Left  ventricular ejection fraction by 3D volume is 65 %. The left ventricle has  normal function. The left ventricle has no regional wall motion  abnormalities. Left ventricular diastolic   parameters are consistent with Grade I diastolic dysfunction (impaired  relaxation).   2. Right ventricular systolic function is normal. The right ventricular  size is mildly enlarged. There is normal pulmonary artery systolic  pressure. The estimated right ventricular systolic pressure is 57.3 mmHg.   3. Left atrial size was mildly dilated.   4. Right  atrial size was mildly dilated.   5. The mitral valve is normal in structure. No evidence of mitral valve  regurgitation. No evidence of mitral stenosis.   6. The aortic valve is calcified. There is moderate calcification of the  aortic valve. There is moderate thickening of the aortic valve. Aortic  valve regurgitation is not visualized. Mild to moderate aortic valve  stenosis. Aortic valve area, by VTI  measures 1.17 cm. Aortic valve mean gradient measures 19.0 mmHg. Aortic  valve Vmax measures 2.99 m/s.   7. The inferior vena cava is normal in size with greater than 50%  respiratory variability, suggesting right atrial pressure of 3 mmHg.   EKG:  EKG is not ordered today.    Recent Labs: 12/06/2020: BUN 69; Creatinine, Ser 4.90; Hemoglobin 13.0; Platelets 190; Potassium 3.8; Sodium 137  Recent Lipid Panel    Component Value Date/Time   CHOL 110 04/19/2019 0332   CHOL 196 03/31/2019 1436   TRIG 59 04/19/2019 0332   HDL 42 04/19/2019 0332   HDL 87 03/31/2019 1436   CHOLHDL 2.6 04/19/2019 0332   VLDL 12 04/19/2019 0332   LDLCALC 56 04/19/2019 0332   LDLCALC 96 03/31/2019 1436    Physical Exam:    VS:  BP (!) 142/72    Pulse 68    Ht 5' 11.5" (1.816 m)    Wt 178 lb (80.7 kg)    SpO2 100%    BMI 24.48 kg/m     Wt Readings from Last 3 Encounters:  02/09/21 178 lb (80.7 kg)  12/06/20 173 lb 1 oz (78.5 kg)  07/13/20 171 lb 3.2 oz (77.7 kg)     GEN:  Well nourished, well developed in no acute distress HEENT: Normal NECK: No JVD; No carotid bruits LYMPHATICS: No lymphadenopathy CARDIAC: RRR, holosystolic murmur, I do not hear S2 RESPIRATORY:  Clear to auscultation without rales, wheezing or rhonchi  ABDOMEN: Soft, non-tender, non-distended MUSCULOSKELETAL:  No edema; No deformity  SKIN: Warm and dry NEUROLOGIC:  Alert and oriented x 3 PSYCHIATRIC:  Normal affect   ASSESSMENT:    1. Aortic valve stenosis, etiology of cardiac valve disease unspecified   2. Coronary  artery disease involving coronary bypass graft of native heart without angina pectoris   3. Dyslipidemia   4. Hypertension, unspecified type   5. Hx of CABG   6. ESRD needing dialysis ([place])   7. Type 2 diabetes mellitus with stage 5 chronic kidney disease not on chronic dialysis, without long-term current use of insulin ([place])   8. Peripheral artery disease ([place])   9. Diastolic CHF, chronic (HCC)    PLAN:    In order of problems listed above:  CAD s/p CABG 1987 - continue ASA and BB - patent grafts by cath in 2019 - no angina, has not used SL nitro   Hypertension - 10 mg amlodipine, 3.125 mg coreg BID, 50 mg hydralazine BID   Hyperlipidemia with LDL goal < 70 04/19/2019: Cholesterol 110; HDL 42; LDL Cholesterol 56; Triglycerides 59; VLDL 12 - continue 80 mg lipitor   ESRD on PD - per nephrology   DM2 - glimepiride, tradjenta    Mild PAD with intermittent claudication - not lifestyle limiting, medical therapy recommended - is not bothered by claudication today   Moderate AS Mild progression of AS - now moderate. He rides a stationary bike for about 20 min but in 5 min increments.  Mean gradient is 27 mmHg an VTI 1.38 cm2. We discussed symptoms of progression of his AS, I went over this several times and briefly described TAVR to replace his valve. He is currently feeling well and wants to watch this for the next 6 months. Plan to re-evaluate his valve in 6 months with echo and follow up appt.    Chronic diastolic heart failure - 80 mg lasix BID Appears euvolemic on exam.    Echo and appt in 6 months.   Medication Adjustments/Labs and Tests Ordered: Current medicines are reviewed at length with the patient today.  Concerns regarding medicines are outlined above.  Orders Placed This Encounter  Procedures   ECHOCARDIOGRAM COMPLETE    No orders of the defined types were placed in this encounter.   Signed, Ledora Bottcher, [place]  02/09/2021 4:37 PM    [facility]

## 2021-02-09 ENCOUNTER — Ambulatory Visit (INDEPENDENT_AMBULATORY_CARE_PROVIDER_SITE_OTHER): Payer: Medicare Other | Admitting: Physician Assistant

## 2021-02-09 ENCOUNTER — Encounter: Payer: Self-pay | Admitting: Physician Assistant

## 2021-02-09 VITALS — BP 142/72 | HR 68 | Ht 71.5 in | Wt 178.0 lb

## 2021-02-09 DIAGNOSIS — I10 Essential (primary) hypertension: Secondary | ICD-10-CM | POA: Diagnosis not present

## 2021-02-09 DIAGNOSIS — I739 Peripheral vascular disease, unspecified: Secondary | ICD-10-CM

## 2021-02-09 DIAGNOSIS — I25810 Atherosclerosis of coronary artery bypass graft(s) without angina pectoris: Secondary | ICD-10-CM | POA: Diagnosis not present

## 2021-02-09 DIAGNOSIS — Z951 Presence of aortocoronary bypass graft: Secondary | ICD-10-CM

## 2021-02-09 DIAGNOSIS — E1122 Type 2 diabetes mellitus with diabetic chronic kidney disease: Secondary | ICD-10-CM

## 2021-02-09 DIAGNOSIS — E785 Hyperlipidemia, unspecified: Secondary | ICD-10-CM | POA: Diagnosis not present

## 2021-02-09 DIAGNOSIS — I5032 Chronic diastolic (congestive) heart failure: Secondary | ICD-10-CM

## 2021-02-09 DIAGNOSIS — N185 Chronic kidney disease, stage 5: Secondary | ICD-10-CM

## 2021-02-09 DIAGNOSIS — I350 Nonrheumatic aortic (valve) stenosis: Secondary | ICD-10-CM

## 2021-02-09 DIAGNOSIS — N186 End stage renal disease: Secondary | ICD-10-CM

## 2021-02-09 DIAGNOSIS — Z992 Dependence on renal dialysis: Secondary | ICD-10-CM

## 2021-02-09 NOTE — Patient Instructions (Signed)
Medication Instructions:  Your physician recommends that you continue on your current medications as directed. Please refer to the Current Medication list given to you today.  *If you need a refill on your cardiac medications before your next appointment, please call your pharmacy*  Lab Work: NONE ordered at this time of appointment   If you have labs (blood work) drawn today and your tests are completely normal, you will receive your results only by: [place] (if you have MyChart) OR A paper copy in the mail If you have any lab test that is abnormal or we need to change your treatment, we will call you to review the results.  Testing/Procedures: Your physician has requested that you have an echocardiogram. Echocardiography is a painless test that uses sound waves to create images of your heart. It provides your doctor with information about the size and shape of your heart and how well your hearts chambers and valves are working. This procedure takes approximately one hour. There are no restrictions for this procedure.  Please schedule for June 2023   Follow-Up: At [facility], you and your health needs are our priority.  As part of our continuing mission to provide you with exceptional heart care, we have created designated Provider Care Teams.  These Care Teams include your primary Cardiologist (physician) and Advanced Practice Providers (APPs -  Physician Assistants and Nurse Practitioners) who all work together to provide you with the care you need, when you need it.  We recommend signing up for the patient portal called "MyChart".  Sign up information is provided on this After Visit Summary.  MyChart is used to connect with patients for Virtual Visits (Telemedicine).  Patients are able to view lab/test results, encounter notes, upcoming appointments, etc.  Non-urgent messages can be sent to your provider as well.   To learn more about what you can do with MyChart, go to  [email or web address].    Your next appointment:   6 month(s) 2-3 days after Echocardiogram   The format for your next appointment:   In Person  Provider:   Pixie Casino, MD    Other Instructions

## 2021-02-28 ENCOUNTER — Other Ambulatory Visit: Payer: Self-pay | Admitting: Internal Medicine

## 2021-03-27 ENCOUNTER — Other Ambulatory Visit: Payer: Self-pay | Admitting: Internal Medicine

## 2021-04-02 ENCOUNTER — Other Ambulatory Visit: Payer: Self-pay | Admitting: Internal Medicine

## 2021-06-14 NOTE — Progress Notes (Signed)
? ?Cardiology Clinic Note  ? ?Patient Name: [name] ?Date of Encounter: 06/16/2021 ? ?Primary Care Provider:  Jani Gravel, MD ?Primary Cardiologist:  Pixie Casino, MD ? ?Patient Profile  ?  ?[name] presents to the clinic today for follow-up evaluation of his coronary artery disease and essential hypertension. ? ?Past Medical History  ?  ?Past Medical History:  ?Diagnosis Date  ? Aortic stenosis 02/27/2020  ? mild to moderate AS  ? Arthritis   ? "a little in LLE" (06/20/2017)  ? Chronic diastolic CHF (congestive heart failure) ([place])   ? CKD (chronic kidney disease), stage IV ([place])   ? Coronary artery disease   ? a. s/p CABG 1980s. b. stable by cath 05/2017.  ? Dyslipidemia   ? Edema 08/08/2017  ? HANDS & LOWER EXTREMITES  ? Hypertension   ? Hypoalbuminemia   ? Nephrotic range proteinuria   ? Peripheral artery disease ([place])   ? mild  ? Pneumonia ~ 1950 X 1  ? Prostate cancer ([place]) ~ 2015  ? Type II diabetes mellitus ([place])   ? ?Past Surgical History:  ?Procedure Laterality Date  ? CARDIAC CATHETERIZATION    ? "a couple times" (06/20/2017)  ? CATARACT EXTRACTION W/ INTRAOCULAR LENS  IMPLANT, BILATERAL Bilateral   ? COLONOSCOPY    ? CORONARY ARTERY BYPASS GRAFT  03/06/1985  ? LAD and first diagonal/circumflex (sequential saphenous vein graft,cardioplegia  ? INGUINAL HERNIA REPAIR Right 12/06/2020  ? Procedure: OPEN RIGHT INGUINAL HERNIA REPAIR;  Surgeon: Johnathan Hausen, MD;  Location: [facility];  Service: General;  Laterality: Right;  ? [place]  ~ 2015  ? NM MYOCAR PERF WALL MOTION  09/12/2006  ? no ischemia  ? RIGHT/LEFT HEART CATH AND CORONARY/GRAFT ANGIOGRAPHY N/A 06/21/2017  ? Procedure: RIGHT/LEFT HEART CATH AND CORONARY/GRAFT ANGIOGRAPHY;  Surgeon: Burnell Blanks, MD;  Location: [place] [facility];  Service: Cardiovascular;  Laterality: N/A;  ? US ECHOCARDIOGRAPHY  11/04/2008  ? trace TR & MR  ? ? ?Allergies ? ?No Known Allergies ? ?History of Present Illness  ?   ?[name] has a PMH of coronary artery disease status post CABG 1987, essential hypertension, PAD, hyperlipidemia, diabetes, CKD, ESRD, dizziness, shortness of breath, and hypokalemia.  His PMH also includes chronic diastolic CHF. He was admitted with shortness of breath and mildly elevated troponins on 04/18/2019-04/25/2019.  He stated that he had noticed progressive shortness of breath over the last several months which had worsened 2 days prior to coming to the hospital.  He was followed closely by nephrology and recently had a peritoneal dialysis site placed by vascular surgeon.  He does continue to make urine.  He was on Lasix for congestive heart failure which had recently been doubled due to his bilateral lower extremity edema and weight gain.  His HsT was elevated at 335 and cardiology was consulted. ?  ?He was started on IV heparin due to concern for possible ACS.  He had no complaints of chest discomfort/angina.  His chest x-ray showed pulmonary edema versus PNA.  His BNP was also elevated at 1406.  He also had leukocytosis and low-grade fever.  His Covid was negative.  He was ultimately started on IV Lasix 80 mg twice daily and his potassium was replaced multiple times throughout his hospitalization.  Due to his elevated WBCs he was started on empiric antibiotic therapy.  His high-sensitivity troponins were low and flat and in the absence of chest pain/angina so there  was a very low suspicion for ACS and his IV heparin was DC'd. ?  ?This follow-up echocardiogram showed normal LVEF with an RWMA and grade 2 diastolic dysfunction, mild TR, and moderate aortic stenosis, which was unchanged from prior studies.  Due to his CKD stage V and anticipation of peritoneal dialysis, nephrology was consulted for diuretic recommendations.  His weight at discharge was 172 pounds.  He was transitioned from IV diuresis to p.o. Lasix 80 mg twice daily.  His amlodipine, carvedilol, hydralazine, and statin were  continued. ?  ?He presented for follow-up with me 05/14/2019.  He stated he felt well.  He had 2 more days and his peritoneal dialysis class before he would be able to do his peritoneal dialysis on his own.  His weight was 155 pounds.  He had been walking regularly for an hour a day and avoiding sodium in his diet.  He continued to wear his lower extremity support stockings.  He went on to state that he  received both of his COVID-19 vaccinations. ? ?He followed up with Doreene Adas PA-C on 02/09/2021.  He denied claudication.  His repeat echocardiogram 12/22 showed aortic stenosis that had progressed to moderate.  He noticed improvement in his fatigue and DOE with starting co-Q10 and B complex vitamins.  He was walking 5-minute increments in his house.  He was doing this several times per day.  It was felt that his dementia may be progressing because instructions had to be given multiple times. ? ?He presents to the clinic today for follow-up evaluation and states he has been noticing increased shortness of breath and dizziness over the last 4 to 5 days.  He reports that he typically walks out to his mailbox and back without issue.  He is having to stop halfway for rest breaks.  He does report trouble with pollen and seasonal allergies.  I have asked him to use his allergy medication daily/nasal spray daily.  He denies any irregular or skipped heartbeats.  He has had a 5 pound weight gain in the last 4 months.  He does not appear volume overloaded today.  He has normal breathing at rest.  He does have some continued chronic difficulty with memory.  I will order a echocardiogram, CBC, BMP, lipid panel and LFTs.  I have encouraged him to use his allergy medication daily, maintain his p.o. hydration, and we will plan follow-up for after his echocardiogram. ? ? ?Today he denies chest pain, shortness of breath, lower extremity edema, fatigue, palpitations, melena, hematuria, hemoptysis, diaphoresis, weakness, presyncope,  syncope, orthopnea, and PND. ? ? ?Home Medications  ?  ?Prior to Admission medications   ?Medication Sig Start Date End Date Taking? Authorizing Provider  ?amLODipine (NORVASC) 10 MG tablet Take 10 mg by mouth at bedtime.    [provider]  ?aspirin EC 81 MG tablet Take 81 mg by mouth daily.    [provider]  ?atorvastatin (LIPITOR) 80 MG tablet TAKE 1/2 TABLET BY MOUTH AT BEDTIME 04/04/21   Hilty, Nadean Corwin, MD  ?Azelastine HCl 0.15 % SOLN Place 1 spray into both nostrils daily as needed (allergies).    [provider]  ?B Complex-C (B COMPLEX-VITAMIN C) CAPS Take by mouth. 01/06/21   [provider]  ?carvedilol (COREG) 3.125 MG tablet TAKE 1 TABLET (3.125 MG TOTAL) BY MOUTH 2 (TWO) TIMES DAILY WITH A MEAL. 03/01/21   Hilty, Nadean Corwin, MD  ?folic acid (FOLVITE) 268 MCG tablet Take 800 mcg by mouth  daily.    [provider]  ?furosemide (LASIX) 80 MG tablet TAKE 1 TABLET (80 MG TOTAL) BY MOUTH 2 (TWO) TIMES DAILY AT 10 AM AND 5 PM. 07/06/20   Lorretta Harp, MD  ?glimepiride (AMARYL) 4 MG tablet Take 4 mg by mouth daily as needed (diabetes).    [provider]  ?hydrALAZINE (APRESOLINE) 50 MG tablet TAKE 1 TABLET (50 MG TOTAL) BY MOUTH IN THE MORNING AND AT BEDTIME. 03/28/21   Hilty, Nadean Corwin, MD  ?HYDROcodone-acetaminophen (NORCO/VICODIN) 5-325 MG tablet Take 1 tablet by mouth every 6 (six) hours as needed for moderate pain. 12/06/20   Johnathan Hausen, MD  ?insulin glargine (LANTUS) 100 UNIT/ML injection Inject 13 Units into the skin at bedtime. 10/29/19   [provider]  ?KLOR-CON M10 10 MEQ tablet TAKE 1 TABLET BY MOUTH EVERY DAY 07/27/20   Hilty, Nadean Corwin, MD  ?Multiple Vitamin (MULTIVITAMIN) tablet Take 1 tablet by mouth daily.     [provider]  ?nitroGLYCERIN (NITROSTAT) 0.4 MG SL tablet Place 1 tablet (0.4 mg total) under the tongue every 5 (five) minutes x 3 doses as needed for chest pain. 04/28/19   Pixie Casino, MD  ?OVER THE  COUNTER MEDICATION Place 1 spray into the nose daily as needed. Sovereign Silver Nasal Spray for Immune Support    [provider]  ?Propylene Glycol (SYSTANE COMPLETE) 0.6 % SOLN Place 1 drop into both eye

## 2021-06-16 ENCOUNTER — Ambulatory Visit (INDEPENDENT_AMBULATORY_CARE_PROVIDER_SITE_OTHER): Payer: Medicare Other | Admitting: General Practice

## 2021-06-16 ENCOUNTER — Encounter: Payer: Self-pay | Admitting: General Practice

## 2021-06-16 VITALS — BP 138/66 | HR 67 | Ht 71.0 in | Wt 185.2 lb

## 2021-06-16 DIAGNOSIS — Z992 Dependence on renal dialysis: Secondary | ICD-10-CM

## 2021-06-16 DIAGNOSIS — E7800 Pure hypercholesterolemia, unspecified: Secondary | ICD-10-CM

## 2021-06-16 DIAGNOSIS — I10 Essential (primary) hypertension: Secondary | ICD-10-CM

## 2021-06-16 DIAGNOSIS — I25810 Atherosclerosis of coronary artery bypass graft(s) without angina pectoris: Secondary | ICD-10-CM

## 2021-06-16 DIAGNOSIS — E1122 Type 2 diabetes mellitus with diabetic chronic kidney disease: Secondary | ICD-10-CM

## 2021-06-16 DIAGNOSIS — I5042 Chronic combined systolic (congestive) and diastolic (congestive) heart failure: Secondary | ICD-10-CM

## 2021-06-16 DIAGNOSIS — N186 End stage renal disease: Secondary | ICD-10-CM

## 2021-06-16 DIAGNOSIS — I5032 Chronic diastolic (congestive) heart failure: Secondary | ICD-10-CM | POA: Diagnosis not present

## 2021-06-16 DIAGNOSIS — R42 Dizziness and giddiness: Secondary | ICD-10-CM

## 2021-06-16 DIAGNOSIS — I739 Peripheral vascular disease, unspecified: Secondary | ICD-10-CM

## 2021-06-16 DIAGNOSIS — I350 Nonrheumatic aortic (valve) stenosis: Secondary | ICD-10-CM

## 2021-06-16 DIAGNOSIS — N185 Chronic kidney disease, stage 5: Secondary | ICD-10-CM

## 2021-06-16 NOTE — Patient Instructions (Signed)
Medication Instructions:  ?MAKE SURE TO USE THE ALLERGY NASAL SPRAY DAILY ? ?*If you need a refill on your cardiac medications before your next appointment, please call your pharmacy* ? ?Lab Work:    ?FASTING LIPID, CBC, CMET AND LFT ?     ?If you have labs (blood work) drawn today and your tests are completely normal, you will receive your results only by: ?MyChart Message (if you have MyChart) OR  A paper copy in the mail ?If you have any lab test that is abnormal or we need to change your treatment, we will call you to review the results. ? ?Testing/Procedures:  ?Echocardiogram - Your physician has requested that you have an echocardiogram. Echocardiography is a painless test that uses sound waves to create images of your heart. It provides your doctor with information about the size and shape of your heart and how well your heart?s chambers and valves are working. This procedure takes approximately one hour. There are no restrictions for this procedure. This will be performed at either our [facility] location - [address], [place] location [facility] 2nd floor. ? ?MAINTAIN YOUR HYDRATION ? ?Follow-Up: ?Your next appointment:  AFTER ECHO  In Person with Pixie Casino, MD  or Coletta Memos, FNP      ? ?At [facility], you and your health needs are our priority.  As part of our continuing mission to provide you with exceptional heart care, we have created designated Provider Care Teams.  These Care Teams include your primary Cardiologist (physician) and Advanced Practice Providers (APPs -  Physician Assistants and Nurse Practitioners) who all work together to provide you with the care you need, when you need it. ? ?We recommend signing up for the patient portal called "MyChart".  Sign up information is provided on this After Visit Summary.  MyChart is used to connect with patients for Virtual Visits (Telemedicine).  Patients are able to view lab/test results, encounter  notes, upcoming appointments, etc.  Non-urgent messages can be sent to your provider as well.   ?To learn more about what you can do with MyChart, go to [email or web address].   ? ? ? ?Important Information About Sugar ? ? ? ? ? ? ? ?  ? ?

## 2021-06-23 ENCOUNTER — Other Ambulatory Visit: Payer: Self-pay | Admitting: Cardiovascular Disease

## 2021-07-05 ENCOUNTER — Ambulatory Visit (HOSPITAL_COMMUNITY): Payer: Medicare Other | Attending: Cardiology

## 2021-07-05 ENCOUNTER — Other Ambulatory Visit: Payer: Medicare Other | Admitting: *Deleted

## 2021-07-05 DIAGNOSIS — I350 Nonrheumatic aortic (valve) stenosis: Secondary | ICD-10-CM | POA: Insufficient documentation

## 2021-07-05 DIAGNOSIS — I5042 Chronic combined systolic (congestive) and diastolic (congestive) heart failure: Secondary | ICD-10-CM

## 2021-07-05 LAB — ECHOCARDIOGRAM COMPLETE
AR max vel: 1.34 cm2
AV Area VTI: 1.44 cm2
AV Area mean vel: 1.34 cm2
AV Mean grad: 26.3 mmHg
AV Peak grad: 47.3 mmHg
Ao pk vel: 3.44 m/s
Area-P 1/2: 2.91 cm2
P 1/2 time: 435 msec
S' Lateral: 2.7 cm

## 2021-07-05 LAB — BASIC METABOLIC PANEL
BUN/Creatinine Ratio: 8 — ABNORMAL LOW (ref 10–24)
BUN: 39 mg/dL — ABNORMAL HIGH (ref 8–27)
CO2: 34 mmol/L — ABNORMAL HIGH (ref 20–29)
Calcium: 9.4 mg/dL (ref 8.6–10.2)
Chloride: 94 mmol/L — ABNORMAL LOW (ref 96–106)
Creatinine, Ser: 5.16 mg/dL — ABNORMAL HIGH (ref 0.76–1.27)
Glucose: 148 mg/dL — ABNORMAL HIGH (ref 70–99)
Potassium: 3.5 mmol/L (ref 3.5–5.2)
Sodium: 143 mmol/L (ref 134–144)
eGFR: 10 mL/min/{1.73_m2} — ABNORMAL LOW (ref >59)

## 2021-07-05 LAB — LIPID PANEL
Chol/HDL Ratio: 2.4 ratio (ref 0.0–5.0)
Cholesterol, Total: 170 mg/dL (ref 100–199)
HDL: 72 mg/dL (ref >39)
LDL Chol Calc (NIH): 84 mg/dL (ref 0–99)
Triglycerides: 72 mg/dL (ref 0–149)
VLDL Cholesterol Cal: 14 mg/dL (ref 5–40)

## 2021-07-05 LAB — CBC
Hematocrit: 35.9 % — ABNORMAL LOW (ref 37.5–51.0)
Hemoglobin: 12.3 g/dL — ABNORMAL LOW (ref 13.0–17.7)
MCH: 33.5 pg — ABNORMAL HIGH (ref 26.6–33.0)
MCHC: 34.3 g/dL (ref 31.5–35.7)
MCV: 98 fL — ABNORMAL HIGH (ref 79–97)
Platelets: 192 10*3/uL (ref 150–450)
RBC: 3.67 x10E6/uL — ABNORMAL LOW (ref 4.14–5.80)
RDW: 12.7 % (ref 11.6–15.4)
WBC: 7 10*3/uL (ref 3.4–10.8)

## 2021-07-05 LAB — HEPATIC FUNCTION PANEL
ALT: 21 IU/L (ref 0–44)
AST: 31 IU/L (ref 0–40)
Albumin: 3.8 g/dL (ref 3.6–4.6)
Alkaline Phosphatase: 59 IU/L (ref 44–121)
Bilirubin Total: 0.4 mg/dL (ref 0.0–1.2)
Bilirubin, Direct: 0.12 mg/dL (ref 0.00–0.40)
Total Protein: 6.3 g/dL (ref 6.0–8.5)

## 2021-07-06 ENCOUNTER — Other Ambulatory Visit: Payer: Self-pay

## 2021-07-06 DIAGNOSIS — I350 Nonrheumatic aortic (valve) stenosis: Secondary | ICD-10-CM

## 2021-07-06 NOTE — Progress Notes (Signed)
Echo

## 2021-07-07 ENCOUNTER — Encounter: Payer: Self-pay | Admitting: Internal Medicine

## 2021-07-07 ENCOUNTER — Encounter: Payer: Self-pay | Admitting: General Practice

## 2021-07-07 ENCOUNTER — Ambulatory Visit: Payer: Medicare Other | Admitting: Internal Medicine

## 2021-07-07 VITALS — BP 132/60 | HR 58 | Ht 72.0 in | Wt 183.8 lb

## 2021-07-07 DIAGNOSIS — R42 Dizziness and giddiness: Secondary | ICD-10-CM

## 2021-07-07 DIAGNOSIS — I739 Peripheral vascular disease, unspecified: Secondary | ICD-10-CM | POA: Diagnosis not present

## 2021-07-07 DIAGNOSIS — I25810 Atherosclerosis of coronary artery bypass graft(s) without angina pectoris: Secondary | ICD-10-CM

## 2021-07-07 DIAGNOSIS — N186 End stage renal disease: Secondary | ICD-10-CM

## 2021-07-07 DIAGNOSIS — I350 Nonrheumatic aortic (valve) stenosis: Secondary | ICD-10-CM

## 2021-07-07 DIAGNOSIS — E1122 Type 2 diabetes mellitus with diabetic chronic kidney disease: Secondary | ICD-10-CM

## 2021-07-07 DIAGNOSIS — I5042 Chronic combined systolic (congestive) and diastolic (congestive) heart failure: Secondary | ICD-10-CM

## 2021-07-07 DIAGNOSIS — Z992 Dependence on renal dialysis: Secondary | ICD-10-CM

## 2021-07-07 DIAGNOSIS — N185 Chronic kidney disease, stage 5: Secondary | ICD-10-CM

## 2021-07-07 NOTE — Patient Instructions (Signed)
Medication Instructions:  ?Your physician recommends that you continue on your current medications as directed. Please refer to the Current Medication list given to you today. ? ?*If you need a refill on your cardiac medications before your next appointment, please call your pharmacy* ? ? ?Testing/Procedures: ?Your physician has requested that you have an echocardiogram. Echocardiography is a painless test that uses sound waves to create images of your heart. It provides your doctor with information about the size and shape of your heart and how well your heart?s chambers and valves are working. This procedure takes approximately one hour. There are no restrictions for this procedure. ?-- due November 2023 ?-- done at [facility] ([address] - 3rd Floor) ? ? ?Follow-Up: ?At [facility], you and your health needs are our priority.  As part of our continuing mission to provide you with exceptional heart care, we have created designated Provider Care Teams.  These Care Teams include your primary Cardiologist (physician) and Advanced Practice Providers (APPs -  Physician Assistants and Nurse Practitioners) who all work together to provide you with the care you need, when you need it. ? ?We recommend signing up for the patient portal called "MyChart".  Sign up information is provided on this After Visit Summary.  MyChart is used to connect with patients for Virtual Visits (Telemedicine).  Patients are able to view lab/test results, encounter notes, upcoming appointments, etc.  Non-urgent messages can be sent to your provider as well.   ?To learn more about what you can do with MyChart, go to [email or web address].   ? ?Your next appointment:   ?6 month(s) ? ?The format for your next appointment:   ?In Person ? ?Provider:   ?Pixie Casino, MD { ? ?After echo ?

## 2021-07-07 NOTE — Progress Notes (Signed)
? ?OFFICE NOTE  ? ?Chief Complaint:  ?Follow-up dizziness ? ?Primary Care Physician: ?Jani Gravel, MD ? ?HPI:  ?[name] is a 88 year old with a history of coronary artery disease status post CABG in 1987, hypertension, dyslipidemia, diabetes, and peripheral arterial disease which is mild. Over the past year he has done well from a cardiac standpoint. He does have a history of prostate cancer and underwent radiation seed implant for that. He has also been having problems with confusion and brain fog and his medications were adjusted, including holding his statin, which did not cause much improvement. This may be some early dementia. In addition, he has abnormal lower extremity Dopplers, which I repeated. This has actually shown mild improvement in his ABIs with ABI 1.1 on the right and ABI of 0.92 on the left. The bilateral posterior tibials were occluded and there was 50% to 69% reduction in left common iliac and about a 0 to 49% reduction in the right superficial femoral. Despite this, he has no symptoms of claudication. He also denies any chest pain. He has had some shortness of breath and this, however, seems to be related to this episode over the past several months where he reported initially he was thought stung by a bee, developed swelling on the left side of his face and then subsequently developed extreme weakness and there as concern of a rheumatoid arthritis. His symptoms have improved with a steroid burst and tapered. He is currently on prednisone as well as methotrexate. He has also had recent dizziness. He reports head and sinus congestion, which may be contributing to his symptoms. However some of the dizziness is associated with walking up a hill at the end of exercise or walking down hills particularly. Is not associated with change in position or quick head movements.  There is some associated fatigue and chest fullness with exercise and improves with rest, reminiscent of possible  angina. ? ?He underwent bilateral carotid Dopplers which showed a mild amount of plaque. A nuclear stress test performed which demonstrated no reversible ischemia and preserved ejection fraction. He still reports he has some mild dizziness and does fatigue a little at the beginning of exercise but improves when he continues to exercise. ? ?He is generally without complaints today. He reports that recently blood work indicated that his kidney function was worsening and that he is now scheduled to see a nephrologist next Monday. I do not yet have those results in front of me. His metformin was discontinued due to this but he remains on lisinopril and that may need to be adjusted. He denies any specific anginal symptoms. He has some very mild claudication symptoms but it improves with walking. He continues to have a systolic murmur best heard over the aortic area and does have a history of aortic sclerosis in the past. ? ?[name] returns today for follow-up. Unfortunately says he hasn't taken his medicine today. He is supposed to take hydralazine 100 mg 3 times a day and for some reason has not taken 2 doses this morning. Blood pressure was very high at 196/86 but he denied any chest pain or headache. He says he going to take his medicine when he gets home. Is also on Flomax but no other blood pressure medicines. This is probably due to worsening renal function. Currently his creatinine is around 3 and is followed by Dr. Mercy Moore. Is also on Lipitor and cholesterol was recently checked by Dr. Wilson Singer. ? ?10/07/2015 ? ?[name]  returns today for follow-up. Over the past year he's had no new complaints although his blood pressures been difficult to control. He's followed by Dr. Mercy Moore. He was previously on amlodipine but that's been discontinued. He is now on hydralazine, clonidine and valsartan. He did not take his medicines today and his blood pressure is elevated. He says he takes the clonidine once or twice  per day. He tries to void in the morning because it causes extreme fatigue. We had previously discussed the Catapres patch however cost was limiting. He also reports recently some worsening memory. ? ?10/16/2016 ? ?[name] returns for follow-up. Many years since I last saw him. He's called in for some chest pain which she said got better. His last stress test was in 2016 which was negative for ischemia. He was noted to have some aortic sclerosis in 2015 however last year was noted to have a louder systolic murmur which sounds more like aortic stenosis. Blood pressures not been well controlled. Today's elevated again 160/70. He says his blood pressure medicines make him feel tired. He told me that he takes all for his blood pressure medicines at night. When asked further about his hydralazine which is supposed to be taken 3 times a day he says he does not do that. He then reported that his blood pressure typically runs higher in the morning and throughout the day. He currently denies any chest pain. He reports some left lower strandy swelling but also has numbness and tingling has been undergoing and back injections. ? ?10/27/2016 ? ?[name] had adjustments on his medications. He is now taking a hydralazine 3 times a day, irbesartan in the morning and clonidine at night. I reviewed his blood pressures which tend to range in the 140s in the morning before taking medication, in the 120s in the afternoon after medication and typically in the 120s over 80s at night. This indicates adequate treatment of his blood pressure. He did undergo an echocardiogram for aortic stenosis. This demonstrated a normal LVEF of 60-65% with mild aortic stenosis and a mean gradient of 11 mmHg. He understands the implications of this and that we will continue to follow his aortic valve disease. ? ?02/28/2017 ? ?[name] presents today with significant shortness of breath, weight gain and leg edema as well as increasing abdominal  girth over the past 2-3 weeks.  The symptoms came on gradually but clinically worsened.  Weight in August was 185 pounds, he now weighs 204 pounds.  Reports worsening shortness of breath, orthopnea and PND.  Blood pressure was also elevated today 183/75 and recently has been difficult to control.  His primary care provider recommended increasing his clonidine to 0.1 mg 3 times daily, however he has had significant daytime fatigue with this. ? ?03/16/2017 ? ?[name] returns today for follow-up of heart failure.  His weight back in August was 185 pounds and he gained up to 204 pounds.  When I last saw him I doubled his diuretic and increased his blood pressure medication.  Over the past 2 weeks he has diuresed back down to 182 pounds.  He feels markedly better.  Blood pressure is better controlled today 132/58.  He feels like his energy level has improved.  We obtain blood work when he was in congestive heart failure which showed an elevated creatinine over 2 and a baseline around 1.6.  He was hypokalemic and I increased his potassium.  His echo showed an EF of 65-70% with grade 2 diastolic  dysfunction and high LV filling pressures. ? ?06/19/2017 ? ?[name] was seen today in follow-up.  I last saw him in January, at which time he was complaining of shortness of breath and edema.  Weight was up to 204 pounds.  He was given diuretics and his weight has come down significantly.  In fact, his creatinine rose up to 2.7 and weight was down to 182.  He saw his nephrologist, Dr. Hollie Salk, recently who decreased his Lasix further and started amlodipine 10 mg daily.  He now says over the past several months he has had progressive fatigue, decreased exercise tolerance and worsening shortness of breath.  He wonders if it is related to his aortic valve.  In January was noted to have normal systolic function and mild aortic stenosis.  On exam, he has what sounds like moderate left ear with an audible second heart sound.  He  again looks volume overloaded today and weight is back up to 193 pounds, up from 182 pounds.  He denies any chest pain. ? ?01/02/2018 ? ?[name] returns today for follow-up of his heart failure.  Recently

## 2021-08-10 ENCOUNTER — Other Ambulatory Visit (HOSPITAL_COMMUNITY): Payer: Medicare Other

## 2021-08-11 ENCOUNTER — Encounter: Payer: Self-pay | Admitting: *Deleted

## 2021-08-15 ENCOUNTER — Ambulatory Visit: Payer: Medicare Other | Admitting: Psychiatry

## 2021-08-15 ENCOUNTER — Encounter: Payer: Self-pay | Admitting: Psychiatry

## 2021-08-15 ENCOUNTER — Ambulatory Visit: Payer: Medicare Other | Admitting: Internal Medicine

## 2021-08-15 ENCOUNTER — Telehealth: Payer: Self-pay | Admitting: Psychiatry

## 2021-08-15 VITALS — BP 158/78 | HR 60 | Ht 71.0 in | Wt 178.0 lb

## 2021-08-15 DIAGNOSIS — G629 Polyneuropathy, unspecified: Secondary | ICD-10-CM | POA: Diagnosis not present

## 2021-08-15 DIAGNOSIS — R2689 Other abnormalities of gait and mobility: Secondary | ICD-10-CM | POA: Diagnosis not present

## 2021-08-15 DIAGNOSIS — R42 Dizziness and giddiness: Secondary | ICD-10-CM

## 2021-08-15 DIAGNOSIS — R413 Other amnesia: Secondary | ICD-10-CM | POA: Diagnosis not present

## 2021-08-15 NOTE — Telephone Encounter (Signed)
[employer] medicare NPR sent to GI they will call the patient to schedule 

## 2021-08-15 NOTE — Progress Notes (Signed)
[facility]  [name] DOB: [birth year]  REFERRING CLINICIAN: Deon Pilling, NP HISTORY FROM: self REASON FOR VISIT: dizziness, memory loss   HISTORICAL  CHIEF COMPLAINT:  Chief Complaint  Patient presents with   New Patient (Initial Visit)    Rm 8 alone- Pt reports constant dizziness throughout the daily denies any recent falls. He also reports decline in short term memory.     HISTORY OF PRESENT ILLNESS:  The patient presents for evaluation of dizziness, headaches, and memory loss over the past few months. In terms of imbalance he states it feels like he "drank too much whiskey". This is constant, but is worst when he first stands up and when he moves. Denies room spinning. Has had a couple of episodes of light headedness, but imbalance is not always associated with lightheadedness. He has not had any falls. Denies significant lower back pain. Has noticed decreased sensation in his left foot.  Headaches are described a constant tightness around his head without associated photophobia, phonophobia, or nausea.   Hearing is off as well, will hear "crickets" in both ears.  Reports intermittent vision changes "like a veil over his eyes". He will have to blink a few times and it will go away. Denies double vision.  Feels his short term memory has also gotten a little worse over time. He has difficulty retaining information like he used to. States conversations will go in one ear and out the other. This has not affected his ADLs.   OTHER MEDICAL CONDITIONS: CHF, HTN, ESRD on dialysis, CAD, diabetes, carotid stenosis   REVIEW OF SYSTEMS: Full 14 system review of systems performed and negative with exception of: imbalance, memory loss, and headaches  ALLERGIES: Allergies  Allergen Reactions   Carvedilol     Other reaction(s): dizziness   Clonidine Hcl     Other reaction(s): constipation   Colace [Docusate]     ineffective   Miralax [Polyethylene  Glycol]     ineffective    HOME MEDICATIONS: Outpatient Medications Prior to Visit  Medication Sig Dispense Refill   amLODipine (NORVASC) 10 MG tablet Take 10 mg by mouth at bedtime.     aspirin EC 81 MG tablet Take 81 mg by mouth daily.     atorvastatin (LIPITOR) 80 MG tablet TAKE 1/2 TABLET BY MOUTH AT BEDTIME 45 tablet 3   Azelastine HCl 0.15 % SOLN Place 1 spray into both nostrils daily as needed (allergies).     B Complex-C (B COMPLEX-VITAMIN C) CAPS Take by mouth.     B Complex-C-Zn-Folic Acid (DIALYVITE 259 WITH ZINC) 0.8 MG TABS Take 1 tablet by mouth daily.     calcitRIOL (ROCALTROL) 0.25 MCG capsule Take 0.25 mcg by mouth 3 (three) times a week.     Carboxymethylcellulose Sod PF 0.5 % SOLN 1 drop into affected eye as needed     carvedilol (COREG) 3.125 MG tablet TAKE 1 TABLET (3.125 MG TOTAL) BY MOUTH 2 (TWO) TIMES DAILY WITH A MEAL. 563 tablet 1   folic acid (FOLVITE) 875 MCG tablet Take 800 mcg by mouth daily.     furosemide (LASIX) 80 MG tablet TAKE 1 TABLET (80 MG TOTAL) BY MOUTH 2 (TWO) TIMES DAILY AT 10 AM AND 5 PM. 180 tablet 3   gentamicin cream (GARAMYCIN) 0.1 % Apply topically daily.     glimepiride (AMARYL) 4 MG tablet Take 4 mg by mouth daily as needed (diabetes).     hydrALAZINE (APRESOLINE) 50 MG tablet TAKE  1 TABLET (50 MG TOTAL) BY MOUTH IN THE MORNING AND AT BEDTIME. 180 tablet 1   HYDROcodone-acetaminophen (NORCO/VICODIN) 5-325 MG tablet Take 1 tablet by mouth every 6 (six) hours as needed for moderate pain. 15 tablet 0   insulin glargine (LANTUS) 100 UNIT/ML injection Inject 13 Units into the skin at bedtime.     isosorbide mononitrate (IMDUR) 30 MG 24 hr tablet 1 tablet in the morning     KLOR-CON M10 10 MEQ tablet TAKE 1 TABLET BY MOUTH EVERY DAY 90 tablet 3   Multiple Vitamin (MULTIVITAMIN) tablet Take 1 tablet by mouth daily.      nitroGLYCERIN (NITROSTAT) 0.4 MG SL tablet Place 1 tablet (0.4 mg total) under the tongue every 5 (five) minutes x 3 doses as  needed for chest pain. 25 tablet 4   OVER THE COUNTER MEDICATION Place 1 spray into the nose daily as needed. Sovereign Silver Nasal Spray for Immune Support     Propylene Glycol (SYSTANE COMPLETE) 0.6 % SOLN Place 1 drop into both eyes daily as needed (Dry eye).     TRADJENTA 5 MG TABS tablet Take 5 mg by mouth every morning.      traZODone (DESYREL) 50 MG tablet Take 50 mg by mouth at bedtime as needed for sleep.   12   No facility-administered medications prior to visit.    PAST MEDICAL HISTORY: Past Medical History:  Diagnosis Date   Aortic stenosis 02/27/2020   mild to moderate AS   Arthritis    "a little in LLE" (06/20/2017)   Ataxia    Chronic diastolic CHF (congestive heart failure) (HCC)    CKD (chronic kidney disease), stage IV ([place])    Coronary artery disease    a. s/p CABG 1980s. b. stable by cath 05/2017.   Dizziness    Dyslipidemia    Edema 08/08/2017   HANDS & LOWER EXTREMITES   Hypertension    Hypoalbuminemia    Nephrotic range proteinuria    Peripheral artery disease (HCC)    mild   Pneumonia ~ 1950 X 1   Prostate cancer ([place]) ~ 2015   Type II diabetes mellitus ([place])     PAST SURGICAL HISTORY: Past Surgical History:  Procedure Laterality Date   CARDIAC CATHETERIZATION     "a couple times" (06/20/2017)   CATARACT EXTRACTION W/ INTRAOCULAR LENS  IMPLANT, BILATERAL Bilateral    COLONOSCOPY     CORONARY ARTERY BYPASS GRAFT  03/06/1985   LAD and first diagonal/circumflex (sequential saphenous vein graft,cardioplegia   INGUINAL HERNIA REPAIR Right 12/06/2020   Procedure: OPEN RIGHT INGUINAL HERNIA REPAIR;  Surgeon: Johnathan Hausen, MD;  Location: [facility];  Service: General;  Laterality: Right;   INSERTION PROSTATE RADIATION SEED  ~ 2015   NM MYOCAR PERF WALL MOTION  09/12/2006   no ischemia   RIGHT/LEFT HEART CATH AND CORONARY/GRAFT ANGIOGRAPHY N/A 06/21/2017   Procedure: RIGHT/LEFT HEART CATH AND CORONARY/GRAFT ANGIOGRAPHY;  Surgeon: Burnell Blanks,  MD;  Location: [facility];  Service: Cardiovascular;  Laterality: N/A;   US ECHOCARDIOGRAPHY  11/04/2008   trace TR & MR    FAMILY HISTORY: Family History  Problem Relation Age of Onset   Heart attack Mother     SOCIAL HISTORY: Social History   Socioeconomic History   Marital status: Married    Spouse name: [name]   Number of children: Not on file   Years of education: Not on file   Highest education level: Not on file  Occupational  History   Occupation: Retired  Tobacco Use   Smoking status: Former    Packs/day: 1.00    Years: 20.00    Total pack years: 20.00    Types: Cigarettes    Quit date: 08/22/1972    Years since quitting: 49.0   Smokeless tobacco: Never  Vaping Use   Vaping Use: Never used  Substance and Sexual Activity   Alcohol use: Not Currently    Comment: 06/20/2017 "nothing since ~ 1974"   Drug use: No   Sexual activity: Not Currently  Other Topics Concern   Not on file  Social History Narrative   Lives with wife   Right handed   Caffeine- 1 cup some days    Social Determinants of Health   Financial Resource Strain: Not on file  Food Insecurity: No Food Insecurity (10/31/2019)   Hunger Vital Sign    Worried About Running Out of Food in the Last Year: Never true    [place] in the Last Year: Never true  Transportation Needs: No Transportation Needs (10/31/2019)   PRAPARE - Hydrologist (Medical): No    Lack of Transportation (Non-Medical): No  Physical Activity: Sufficiently Active (10/31/2019)   Exercise Vital Sign    Days of Exercise per Week: 7 days    Minutes of Exercise per Session: 40 min  Stress: Not on file  Social Connections: Not on file  Intimate Partner Violence: Not on file     PHYSICAL EXAM   GENERAL EXAM/CONSTITUTIONAL: Vitals:  Vitals:   08/15/21 1104  BP: (!) 158/78  Pulse: 60  Weight: 178 lb (80.7 kg)  Height: '5\' 11"'$  (1.803 m)   Body mass index is 24.83 kg/m. Wt Readings  from Last 3 Encounters:  08/15/21 178 lb (80.7 kg)  07/07/21 183 lb 12.8 oz (83.4 kg)  06/16/21 185 lb 3.2 oz (84 kg)   NEUROLOGIC: MENTAL STATUS:     08/15/2021   11:11 AM  Montreal Cognitive Assessment   Visuospatial/ Executive (0/5) 1  Naming (0/3) 3  Attention: Read list of digits (0/2) 1  Attention: Read list of letters (0/1) 1  Attention: Serial 7 subtraction starting at 100 (0/3) 3  Language: Repeat phrase (0/2) 0  Language : Fluency (0/1) 0  Abstraction (0/2) 1  Delayed Recall (0/5) 1  Orientation (0/6) 6  Total 17    CRANIAL NERVE:  2nd, 3rd, 4th, 6th - pupils equal and reactive to light, visual fields full to confrontation, extraocular muscles intact, no nystagmus 5th - facial sensation symmetric 7th - facial strength symmetric 8th - hearing intact 9th - palate elevates symmetrically, uvula midline 11th - shoulder shrug symmetric 12th - tongue protrusion midline  MOTOR:  normal bulk and tone, 5/5 strength throughout, however he requires support from his arms when changing from sitting to standing.  SENSORY:  Decreased sensation to vibration in bilateral feet up to knees, pinprick and proprioception intact  COORDINATION:  finger-nose-finger, fine finger movements normal  REFLEXES:  deep tendon reflexes present and symmetric  GAIT/STATION:  Decreased stride length  No Romberg sway but does report increased dizziness with eyes closed     DIAGNOSTIC DATA (LABS, IMAGING, TESTING) - I reviewed patient records, labs, notes, testing and imaging myself where available.  Lab Results  Component Value Date   WBC 7.0 07/05/2021   HGB 12.3 (L) 07/05/2021   HCT 35.9 (L) 07/05/2021   MCV 98 (H) 07/05/2021   PLT 192 07/05/2021  Component Value Date/Time   NA 143 07/05/2021 1045   K 3.5 07/05/2021 1045   CL 94 (L) 07/05/2021 1045   CO2 34 (H) 07/05/2021 1045   GLUCOSE 148 (H) 07/05/2021 1045   GLUCOSE 188 (H) 12/06/2020 1244   BUN 39 (H) 07/05/2021  1045   CREATININE 5.16 (H) 07/05/2021 1045   CALCIUM 9.4 07/05/2021 1045   PROT 6.3 07/05/2021 1045   ALBUMIN 3.8 07/05/2021 1045   AST 31 07/05/2021 1045   ALT 21 07/05/2021 1045   ALKPHOS 59 07/05/2021 1045   BILITOT 0.4 07/05/2021 1045   GFRNONAA 11 (L) 12/06/2020 1244   GFRAA 16 (L) 04/25/2019 0441   Lab Results  Component Value Date   CHOL 170 07/05/2021   HDL 72 07/05/2021   LDLCALC 84 07/05/2021   TRIG 72 07/05/2021   CHOLHDL 2.4 07/05/2021   Lab Results  Component Value Date   HGBA1C 7.6 (H) 12/06/2020   No results found for: "VITAMINB12" Lab Results  Component Value Date   TSH 4.171 08/08/2017    06/15/21 TSH 3.99 05/10/21: A1c 6.9 07/2021 B12 wnl   ASSESSMENT AND PLAN  88 y.o. year old male with a history of CHF, HTN, ESRD on dialysis, CAD, diabetes who presents for evaluation of imbalance, headaches, and memory loss over the past few months. MOCA score today is 17/30. Will order MRI brain to assess for neurodegenerative changes or significant vascular disease. Carotid US ordered to assess the status of his carotid stenosis. His exam is suggestive of peripheral neuropathy, likely secondary to his diabetes. Suspect this is contributing to his imbalance as it is exacerbated by closing his eyes. Offered physical therapy for balance exercises, which he declined at this time.   1. Imbalance   2. Memory loss   3. Dizziness   4. Neuropathy       PLAN: -Brain MRI -Carotid US -Consider PT for gait/balance. Patient would prefer to undergo testing first, then will consider PT  Orders Placed This Encounter  Procedures   MR BRAIN WO CONTRAST   US Carotid Bilateral    No orders of the defined types were placed in this encounter.   Return in about 6 months (around 02/14/2022).    Genia Harold, MD 08/15/21 12:13 PM  I spent an average of 41 minutes chart reviewing and counseling the patient, with at least 50% of the time face to face with the patient.    [facility] [address], [place], [place] [address] [phone]

## 2021-08-15 NOTE — Patient Instructions (Addendum)
Brain MRI Carotid ultrasound

## 2021-08-20 ENCOUNTER — Other Ambulatory Visit: Payer: Self-pay | Admitting: Internal Medicine

## 2021-08-27 ENCOUNTER — Ambulatory Visit
Admission: RE | Admit: 2021-08-27 | Discharge: 2021-08-27 | Disposition: A | Discharge status: Home / Self Care | Payer: Medicare Other | Source: Ambulatory Visit | Attending: Psychiatry | Admitting: Psychiatry

## 2021-08-27 DIAGNOSIS — R413 Other amnesia: Secondary | ICD-10-CM | POA: Diagnosis not present

## 2021-10-12 ENCOUNTER — Ambulatory Visit: Payer: Medicare Other | Admitting: Internal Medicine

## 2021-10-14 ENCOUNTER — Telehealth: Payer: Self-pay | Admitting: Internal Medicine

## 2021-10-14 NOTE — Telephone Encounter (Signed)
Pt c/o BP issue: STAT if pt c/o blurred vision, one-sided weakness or slurred speech  1. What are your last 5 BP readings?   170/78 sitting 132/70 standing 188/77 sitting 118/70(?)   2. Are you having any other symptoms (ex. Dizziness, headache, blurred vision, passed out)?   Dizziness - comes and goes  3. What is your BP issue?   Caller stated patient's BP was elevated but dropped lower when he stood up.  Caller stated his amLODipine (NORVASC) 10 MG tablet medication was discontinued.

## 2021-10-14 NOTE — Telephone Encounter (Signed)
Incoming call from NP with [facility]. She stated that she had a visit with the patient today and the patient's blood pressure was:   2:45 170/78 sitting  132/70 standing  3:30 188/77 sitting   The NP stated that the patient's blood pressure was 118/70 this morning, per the patient. He does take it daily and stated that this is his norm. The patient is asymptomatic. Denies chest pain, denies edema.  She stated that the nephrology took him off of Amlodipine. He is not taking Imdur and is taking Hydralazine 25 mg twice daily (not 50 as prescribed).   The NP did advise the patient to check his blood pressure daily, keep a log and keep the office updated.

## 2021-10-20 ENCOUNTER — Other Ambulatory Visit: Payer: Self-pay | Admitting: Psychiatry

## 2021-10-20 ENCOUNTER — Telehealth: Payer: Self-pay | Admitting: Psychiatry

## 2021-10-20 DIAGNOSIS — R42 Dizziness and giddiness: Secondary | ICD-10-CM

## 2021-10-20 NOTE — Telephone Encounter (Signed)
Please place new order as: [id number] for vascular carotid  Per Zacarias Pontes the order in the chart is incorrect and can't be scheduled

## 2021-10-20 NOTE — Telephone Encounter (Addendum)
I let Butch Penny in scheduling at [facility] know and she will call the patient to get him scheduled. Her phone number is [phone]

## 2021-10-20 NOTE — Telephone Encounter (Signed)
I sent in a new order, thanks

## 2021-10-25 ENCOUNTER — Ambulatory Visit
Admission: RE | Admit: 2021-10-25 | Discharge: 2021-10-25 | Disposition: A | Discharge status: Home / Self Care | Payer: Medicare Other | Source: Ambulatory Visit | Attending: Cardiovascular Disease | Admitting: Cardiovascular Disease

## 2021-10-25 ENCOUNTER — Encounter (HOSPITAL_COMMUNITY): Payer: Medicare Other

## 2021-10-25 ENCOUNTER — Ambulatory Visit (INDEPENDENT_AMBULATORY_CARE_PROVIDER_SITE_OTHER): Payer: Medicare Other | Admitting: Nurse Practitioner

## 2021-10-25 ENCOUNTER — Encounter: Payer: Self-pay | Admitting: Nurse Practitioner

## 2021-10-25 VITALS — BP 152/78 | HR 70 | Ht 71.0 in | Wt 175.8 lb

## 2021-10-25 DIAGNOSIS — I2510 Atherosclerotic heart disease of native coronary artery without angina pectoris: Secondary | ICD-10-CM | POA: Insufficient documentation

## 2021-10-25 DIAGNOSIS — E1122 Type 2 diabetes mellitus with diabetic chronic kidney disease: Secondary | ICD-10-CM | POA: Diagnosis present

## 2021-10-25 DIAGNOSIS — I5032 Chronic diastolic (congestive) heart failure: Secondary | ICD-10-CM | POA: Insufficient documentation

## 2021-10-25 DIAGNOSIS — N185 Chronic kidney disease, stage 5: Secondary | ICD-10-CM | POA: Diagnosis present

## 2021-10-25 DIAGNOSIS — R42 Dizziness and giddiness: Secondary | ICD-10-CM

## 2021-10-25 DIAGNOSIS — N186 End stage renal disease: Secondary | ICD-10-CM | POA: Diagnosis present

## 2021-10-25 DIAGNOSIS — I350 Nonrheumatic aortic (valve) stenosis: Secondary | ICD-10-CM | POA: Diagnosis not present

## 2021-10-25 DIAGNOSIS — I10 Essential (primary) hypertension: Secondary | ICD-10-CM

## 2021-10-25 DIAGNOSIS — I6523 Occlusion and stenosis of bilateral carotid arteries: Secondary | ICD-10-CM

## 2021-10-25 DIAGNOSIS — E785 Hyperlipidemia, unspecified: Secondary | ICD-10-CM | POA: Diagnosis present

## 2021-10-25 DIAGNOSIS — I739 Peripheral vascular disease, unspecified: Secondary | ICD-10-CM | POA: Insufficient documentation

## 2021-10-25 DIAGNOSIS — Z992 Dependence on renal dialysis: Secondary | ICD-10-CM | POA: Insufficient documentation

## 2021-10-25 NOTE — Patient Instructions (Signed)
Medication Instructions:  Your physician recommends that you continue on your current medications as directed. Please refer to the Current Medication list given to you today.   *If you need a refill on your cardiac medications before your next appointment, please call your pharmacy*   Lab Work: NONE ordered at this time of appointment   If you have labs (blood work) drawn today and your tests are completely normal, you will receive your results only by: [place] (if you have MyChart) OR A paper copy in the mail If you have any lab test that is abnormal or we need to change your treatment, we will call you to review the results.   Testing/Procedures: Your physician has requested that you have an echocardiogram. Echocardiography is a painless test that uses sound waves to create images of your heart. It provides your doctor with information about the size and shape of your heart and how well your heart's chambers and valves are working. This procedure takes approximately one hour. There are no restrictions for this procedure.   Follow-Up: At [facility], you and your health needs are our priority.  As part of our continuing mission to provide you with exceptional heart care, we have created designated Provider Care Teams.  These Care Teams include your primary Cardiologist (physician) and Advanced Practice Providers (APPs -  Physician Assistants and Nurse Practitioners) who all work together to provide you with the care you need, when you need it.  We recommend signing up for the patient portal called "MyChart".  Sign up information is provided on this After Visit Summary.  MyChart is used to connect with patients for Virtual Visits (Telemedicine).  Patients are able to view lab/test results, encounter notes, upcoming appointments, etc.  Non-urgent messages can be sent to your provider as well.   To learn more about what you can do with MyChart, go to [email or web address].     Your next appointment:    Keep your upcoming appointment   The format for your next appointment:   In Person  Provider:   Pixie Casino, MD     Other Instructions  Important Information About [place]

## 2021-10-25 NOTE — Progress Notes (Signed)
Office Visit    Patient Name: [name] Date of Encounter: 10/25/2021  Primary Care Provider:  Jani Gravel, MD Primary Cardiologist:  Pixie Casino, MD  Chief Complaint    88 year old male with a history of CABG in 1987, aortic stenosis, chronic diastolic heart failure, hypertension, hyperlipidemia, PAD, ESRD on PD, type 2 diabetes, and prostate cancer who presets for follow-up related to CAD and hypertension.   Past Medical History    Past Medical History:  Diagnosis Date   Aortic stenosis 02/27/2020   mild to moderate AS   Arthritis    "a little in LLE" (06/20/2017)   Ataxia    Chronic diastolic CHF (congestive heart failure) (HCC)    CKD (chronic kidney disease), stage IV ([place])    Coronary artery disease    a. s/p CABG 1980s. b. stable by cath 05/2017.   Dizziness    Dyslipidemia    Edema 08/08/2017   HANDS & LOWER EXTREMITES   Hypertension    Hypoalbuminemia    Nephrotic range proteinuria    Peripheral artery disease (HCC)    mild   Pneumonia ~ 1950 X 1   Prostate cancer ([place]) ~ 2015   Type II diabetes mellitus ([place])    Past Surgical History:  Procedure Laterality Date   CARDIAC CATHETERIZATION     "a couple times" (06/20/2017)   CATARACT EXTRACTION W/ INTRAOCULAR LENS  IMPLANT, BILATERAL Bilateral    COLONOSCOPY     CORONARY ARTERY BYPASS GRAFT  03/06/1985   LAD and first diagonal/circumflex (sequential saphenous vein graft,cardioplegia   INGUINAL HERNIA REPAIR Right 12/06/2020   Procedure: OPEN RIGHT INGUINAL HERNIA REPAIR;  Surgeon: Johnathan Hausen, MD;  Location: [facility];  Service: General;  Laterality: Right;   INSERTION PROSTATE RADIATION SEED  ~ 2015   NM MYOCAR PERF WALL MOTION  09/12/2006   no ischemia   RIGHT/LEFT HEART CATH AND CORONARY/GRAFT ANGIOGRAPHY N/A 06/21/2017   Procedure: RIGHT/LEFT HEART CATH AND CORONARY/GRAFT ANGIOGRAPHY;  Surgeon: Burnell Blanks, MD;  Location: [facility];  Service: Cardiovascular;  Laterality:  N/A;   US ECHOCARDIOGRAPHY  11/04/2008   trace TR & MR    Allergies  Allergies  Allergen Reactions   Carvedilol     Other reaction(s): dizziness   Clonidine Hcl     Other reaction(s): constipation   Colace [Docusate]     ineffective   Miralax [Polyethylene Glycol]     ineffective    History of Present Illness    88 year old male with the above past medical history including CABG in 1987, aortic stenosis, chronic diastolic heart failure, hypertension, hyperlipidemia, mild carotid artery stenosis, PAD, ESRD on PD, type 2 diabetes, and prostate cancer.  He has a history of CAD s/p CABG in 1987.  Nuclear stress test in 2016 was nonischemic.  He has had multiple hospitalizations for diastolic heart failure.  Struve mild to moderate AAS.  Cardiac catheterization in 05/2017 showed native disease with 3/3 patent grafts.  He has mild PAD as well as mild bilateral carotid artery stenosis.  Additionally, he has a history of ESRD on peritoneal dialysis, follows with nephrology.  Most recent echocardiogram in 06/2021 showed EF 60 to 65%, mild LVH, G1 DD, mild mitral valve regurgitation, mild aortic valve regurgitation, moderate aortic valve stenosis, mean gradient 24 mmHg.  Repeat echocardiogram was recommended in 6 months.  He was last seen in the office on 07/07/2021 and was stable overall from a cardiac standpoint.  He did report intermittent  dizziness with activity. Follow-up with PCP for consideration of neuro vestibular therapy was recommended should dizziness continue.  He saw neurology on 08/15/2021. MRI brain was negative for acute findings, repeat carotid US pending. PT was also recommended. He contacted our office on 10/14/2021 with reports of ongoing dizziness, elevated blood pressure.  He presents today for follow-up accompanied by his wife.  Since his last visit he has noted increased intermittent dizziness, particularly with prolonged standing and sudden positional changes as well is  progressive dyspnea on exertion.  He notes dyspnea walking the length of his driveway.  He denies any chest discomfort, denies palpitations, presyncope, syncope, edema, PND, orthopnea, weight gain.  He is concerned that his symptoms may be a sign of worsening aortic stenosis and is eager to discuss the plan for management of this going forward.  Home Medications    Current Outpatient Medications  Medication Sig Dispense Refill   amLODipine (NORVASC) 10 MG tablet Take 10 mg by mouth at bedtime.     aspirin EC 81 MG tablet Take 81 mg by mouth daily.     atorvastatin (LIPITOR) 80 MG tablet TAKE 1/2 TABLET BY MOUTH AT BEDTIME 45 tablet 3   Azelastine HCl 0.15 % SOLN Place 1 spray into both nostrils daily as needed (allergies).     B Complex-C (B COMPLEX-VITAMIN C) CAPS Take by mouth.     B Complex-C-Zn-Folic Acid (DIALYVITE 408 WITH ZINC) 0.8 MG TABS Take 1 tablet by mouth daily.     calcitRIOL (ROCALTROL) 0.25 MCG capsule Take 0.25 mcg by mouth 3 (three) times a week.     Carboxymethylcellulose Sod PF 0.5 % SOLN 1 drop into affected eye as needed     carvedilol (COREG) 3.125 MG tablet TAKE 1 TABLET BY MOUTH TWICE A DAY WITH A MEAL 144 tablet 1   folic acid (FOLVITE) 818 MCG tablet Take 800 mcg by mouth daily.     furosemide (LASIX) 80 MG tablet TAKE 1 TABLET (80 MG TOTAL) BY MOUTH 2 (TWO) TIMES DAILY AT 10 AM AND 5 PM. 180 tablet 3   gentamicin cream (GARAMYCIN) 0.1 % Apply topically daily.     glimepiride (AMARYL) 4 MG tablet Take 4 mg by mouth daily as needed (diabetes).     hydrALAZINE (APRESOLINE) 50 MG tablet TAKE 1 TABLET (50 MG TOTAL) BY MOUTH IN THE MORNING AND AT BEDTIME. (Patient taking differently: Take 25 mg by mouth in the morning and at bedtime.) 180 tablet 1   HYDROcodone-acetaminophen (NORCO/VICODIN) 5-325 MG tablet Take 1 tablet by mouth every 6 (six) hours as needed for moderate pain. 15 tablet 0   insulin glargine (LANTUS) 100 UNIT/ML injection Inject 13 Units into the skin at  bedtime.     isosorbide mononitrate (IMDUR) 30 MG 24 hr tablet 1 tablet in the morning     KLOR-CON M10 10 MEQ tablet TAKE 1 TABLET BY MOUTH EVERY DAY 90 tablet 3   Multiple Vitamin (MULTIVITAMIN) tablet Take 1 tablet by mouth daily.      nitroGLYCERIN (NITROSTAT) 0.4 MG SL tablet Place 1 tablet (0.4 mg total) under the tongue every 5 (five) minutes x 3 doses as needed for chest pain. 25 tablet 4   OVER THE COUNTER MEDICATION Place 1 spray into the nose daily as needed. Sovereign Silver Nasal Spray for Immune Support     Propylene Glycol (SYSTANE COMPLETE) 0.6 % SOLN Place 1 drop into both eyes daily as needed (Dry eye).     TRADJENTA 5 MG  TABS tablet Take 5 mg by mouth every morning.      traZODone (DESYREL) 50 MG tablet Take 50 mg by mouth at bedtime as needed for sleep.   12   No current facility-administered medications for this visit.     Review of Systems    He denies chest pain, palpitations, dyspnea, pnd, orthopnea, n, v, dizziness, syncope, edema, weight gain, or early satiety. All other systems reviewed and are otherwise negative except as noted above.   Physical Exam    VS:  BP (!) 152/78   Pulse 70   Ht '5\' 11"'$  (1.803 m)   Wt 175 lb 12.8 oz (79.7 kg)   SpO2 99%   BMI 24.52 kg/m   GEN: Well nourished, well developed, in no acute distress. HEENT: normal. Neck: Supple, no JVD, carotid bruits, or masses. Cardiac: RRR, 3/6 murmur, no rubs, or gallops. No clubbing, cyanosis, edema.  Radials/DP/PT 2+ and equal bilaterally.  Respiratory:  Respirations regular and unlabored, clear to auscultation bilaterally. GI: Soft, nontender, nondistended, BS + x 4. MS: no deformity or atrophy. Skin: warm and dry, no rash. Neuro:  Strength and sensation are intact. Psych: Normal affect.  Accessory Clinical Findings    ECG personally reviewed by me today -sinus rhythm, 70 bpm, occasional PVC- no acute changes.   Lab Results  Component Value Date   WBC 7.0 07/05/2021   HGB 12.3 (L)  07/05/2021   HCT 35.9 (L) 07/05/2021   MCV 98 (H) 07/05/2021   PLT 192 07/05/2021   Lab Results  Component Value Date   CREATININE 5.16 (H) 07/05/2021   BUN 39 (H) 07/05/2021   NA 143 07/05/2021   K 3.5 07/05/2021   CL 94 (L) 07/05/2021   CO2 34 (H) 07/05/2021   Lab Results  Component Value Date   ALT 21 07/05/2021   AST 31 07/05/2021   ALKPHOS 59 07/05/2021   BILITOT 0.4 07/05/2021   Lab Results  Component Value Date   CHOL 170 07/05/2021   HDL 72 07/05/2021   LDLCALC 84 07/05/2021   TRIG 72 07/05/2021   CHOLHDL 2.4 07/05/2021    Lab Results  Component Value Date   HGBA1C 7.6 (H) 12/06/2020    Assessment & Plan    1. Dizziness: Likely multifactorial. He is following with neurology. MRI negative for acute process. Repeat carotid US pending. Occurs mostly with prolonged standing and sudden positional changes. Suspect possible component of orthostatis, AS.    2. Aortic stenosis: Most recent echo in 06/2021 showed EF 60 to 65%, mild LVH, G1 DD, mild mitral valve regurgitation, mild aortic valve regurgitation, moderate aortic valve stenosis, mean gradient 24 mmHg. Repeat echo scheduled for 12/2021.  He is likely nearing need for possible surgical repair (may be a candidate for TAVR).  3. Hypertension: BP elevated above goal. He states BP is well controlled at home.  Continue to monitor BP and report BP consistently greater than 140/80.  If BP remains elevated above goal, consider titration of antihypertensive medication. For now, continue current antihypertensive regimen.   4. CAD/dyspnea on exertion: S/p CABG in 1987. Cath in 2019 showed native disease with 3/3 patent grafts.  He has progressive dyspnea, possibly in the setting of progressive AS. However, if echo stable, consider possible ischemic evaluation in the future. Continue aspirin, amlodipine, carvedilol, hydralazine, isosorbide mononitrate, lasix and Lipitor.  5. Chronic diastolic heart failure: Most recent echo as  above. Euvolemic and well compensated on exam.  Continue current medications as above.  6. Hyperlipidemia: LDL was 84 in 06/2021.  Continue aspirin, Lipitor.  7. Carotid artery stenosis: He notes ongoing dizziness.  Denies presyncope, syncope.  Repeat carotid dopplers pending.   8. PAD: Recently referred to Dr. Gwenlyn Found.  Not felt to have critical limb ischemia or lifestyle-limiting claudication, therefore medical therapy was recommended.  Continue aspirin, Lipitor.  9. ESRD on PD: Following with nephrology.   10. Type 2 diabetes: A1C was 7.6 in 11/2020.  Monitored and managed per PCP.  11. Disposition: Follow-up as scheduled with Dr. Debara Pickett in 12/2021.   HYPERTENSION CONTROL Vitals:   10/25/21 1326 10/25/21 1345  BP: (!) 146/80 (!) 152/78    The patient's blood pressure is elevated above target today.  In order to address the patient's elevated BP: Blood pressure will be monitored at home to determine if medication changes need to be made.     Lenna Sciara, NP 10/25/2021, 5:48 PM

## 2021-11-02 ENCOUNTER — Other Ambulatory Visit (HOSPITAL_COMMUNITY): Payer: Medicare Other

## 2021-11-02 ENCOUNTER — Encounter (HOSPITAL_COMMUNITY): Payer: Self-pay

## 2021-11-02 NOTE — Progress Notes (Signed)
Verified appointment "no show" status with L. Walters at 15:05.

## 2021-11-11 ENCOUNTER — Ambulatory Visit (HOSPITAL_COMMUNITY): Payer: Medicare Other | Attending: Nurse Practitioner

## 2021-11-11 DIAGNOSIS — R42 Dizziness and giddiness: Secondary | ICD-10-CM

## 2021-11-11 DIAGNOSIS — I350 Nonrheumatic aortic (valve) stenosis: Secondary | ICD-10-CM | POA: Diagnosis not present

## 2021-11-11 LAB — ECHOCARDIOGRAM COMPLETE
AR max vel: 1.28 cm2
AV Area VTI: 1.15 cm2
AV Area mean vel: 1.21 cm2
AV Mean grad: 24.3 mmHg
AV Peak grad: 42.9 mmHg
Ao pk vel: 3.27 m/s
Area-P 1/2: 2.69 cm2
P 1/2 time: 497 msec
S' Lateral: 2.9 cm

## 2021-11-16 ENCOUNTER — Telehealth: Payer: Self-pay

## 2021-11-16 NOTE — Telephone Encounter (Signed)
Lmom, waiting on a return call to discuss echo results.  

## 2021-11-30 NOTE — Telephone Encounter (Signed)
Lmom, waiting on a return call to discuss results.

## 2021-12-18 ENCOUNTER — Other Ambulatory Visit: Payer: Self-pay | Admitting: Internal Medicine

## 2021-12-26 ENCOUNTER — Telehealth: Payer: Self-pay | Admitting: Psychiatry

## 2022-01-02 ENCOUNTER — Other Ambulatory Visit (HOSPITAL_COMMUNITY): Payer: Medicare Other

## 2022-01-09 ENCOUNTER — Ambulatory Visit: Payer: Medicare Other | Attending: Internal Medicine | Admitting: Internal Medicine

## 2022-01-09 ENCOUNTER — Encounter: Payer: Self-pay | Admitting: Internal Medicine

## 2022-01-09 VITALS — BP 192/64 | HR 62 | Ht 71.0 in | Wt 181.8 lb

## 2022-01-09 DIAGNOSIS — I10 Essential (primary) hypertension: Secondary | ICD-10-CM | POA: Diagnosis not present

## 2022-01-09 DIAGNOSIS — I350 Nonrheumatic aortic (valve) stenosis: Secondary | ICD-10-CM | POA: Diagnosis not present

## 2022-01-09 DIAGNOSIS — I2510 Atherosclerotic heart disease of native coronary artery without angina pectoris: Secondary | ICD-10-CM

## 2022-01-09 DIAGNOSIS — Z992 Dependence on renal dialysis: Secondary | ICD-10-CM

## 2022-01-09 DIAGNOSIS — I5032 Chronic diastolic (congestive) heart failure: Secondary | ICD-10-CM | POA: Diagnosis not present

## 2022-01-09 DIAGNOSIS — N186 End stage renal disease: Secondary | ICD-10-CM

## 2022-01-09 DIAGNOSIS — Z951 Presence of aortocoronary bypass graft: Secondary | ICD-10-CM

## 2022-01-09 NOTE — Progress Notes (Signed)
OFFICE NOTE   Chief Complaint:  Follow-up   Primary Care Physician: Jani Gravel, MD  HPI:  [name] is a 88 year old with a history of coronary artery disease status post CABG in 1987, hypertension, dyslipidemia, diabetes, and peripheral arterial disease which is mild. Over the past year he has done well from a cardiac standpoint. He does have a history of prostate cancer and underwent radiation seed implant for that. He has also been having problems with confusion and brain fog and his medications were adjusted, including holding his statin, which did not cause much improvement. This may be some early dementia. In addition, he has abnormal lower extremity Dopplers, which I repeated. This has actually shown mild improvement in his ABIs with ABI 1.1 on the right and ABI of 0.92 on the left. The bilateral posterior tibials were occluded and there was 50% to 69% reduction in left common iliac and about a 0 to 49% reduction in the right superficial femoral. Despite this, he has no symptoms of claudication. He also denies any chest pain. He has had some shortness of breath and this, however, seems to be related to this episode over the past several months where he reported initially he was thought stung by a bee, developed swelling on the left side of his face and then subsequently developed extreme weakness and there as concern of a rheumatoid arthritis. His symptoms have improved with a steroid burst and tapered. He is currently on prednisone as well as methotrexate. He has also had recent dizziness. He reports head and sinus congestion, which may be contributing to his symptoms. However some of the dizziness is associated with walking up a hill at the end of exercise or walking down hills particularly. Is not associated with change in position or quick head movements.  There is some associated fatigue and chest fullness with exercise and improves with rest, reminiscent of possible  angina.  He underwent bilateral carotid Dopplers which showed a mild amount of plaque. A nuclear stress test performed which demonstrated no reversible ischemia and preserved ejection fraction. He still reports he has some mild dizziness and does fatigue a little at the beginning of exercise but improves when he continues to exercise.  He is generally without complaints today. He reports that recently blood work indicated that his kidney function was worsening and that he is now scheduled to see a nephrologist next Monday. I do not yet have those results in front of me. His metformin was discontinued due to this but he remains on lisinopril and that may need to be adjusted. He denies any specific anginal symptoms. He has some very mild claudication symptoms but it improves with walking. He continues to have a systolic murmur best heard over the aortic area and does have a history of aortic sclerosis in the past.  [name] returns today for follow-up. Unfortunately says he hasn't taken his medicine today. He is supposed to take hydralazine 100 mg 3 times a day and for some reason has not taken 2 doses this morning. Blood pressure was very high at 196/86 but he denied any chest pain or headache. He says he going to take his medicine when he gets home. Is also on Flomax but no other blood pressure medicines. This is probably due to worsening renal function. Currently his creatinine is around 3 and is followed by Dr. Mercy Moore. Is also on Lipitor and cholesterol was recently checked by Dr. Wilson Singer.  10/07/2015  [name] returns today for follow-up. Over the past year he's had no new complaints although his blood pressures been difficult to control. He's followed by Dr. Mercy Moore. He was previously on amlodipine but that's been discontinued. He is now on hydralazine, clonidine and valsartan. He did not take his medicines today and his blood pressure is elevated. He says he takes the clonidine once or twice  per day. He tries to void in the morning because it causes extreme fatigue. We had previously discussed the Catapres patch however cost was limiting. He also reports recently some worsening memory.  10/16/2016  [name] returns for follow-up. Many years since I last saw him. He's called in for some chest pain which she said got better. His last stress test was in 2016 which was negative for ischemia. He was noted to have some aortic sclerosis in 2015 however last year was noted to have a louder systolic murmur which sounds more like aortic stenosis. Blood pressures not been well controlled. Today's elevated again 160/70. He says his blood pressure medicines make him feel tired. He told me that he takes all for his blood pressure medicines at night. When asked further about his hydralazine which is supposed to be taken 3 times a day he says he does not do that. He then reported that his blood pressure typically runs higher in the morning and throughout the day. He currently denies any chest pain. He reports some left lower strandy swelling but also has numbness and tingling has been undergoing and back injections.  10/27/2016  [name] had adjustments on his medications. He is now taking a hydralazine 3 times a day, irbesartan in the morning and clonidine at night. I reviewed his blood pressures which tend to range in the 140s in the morning before taking medication, in the 120s in the afternoon after medication and typically in the 120s over 80s at night. This indicates adequate treatment of his blood pressure. He did undergo an echocardiogram for aortic stenosis. This demonstrated a normal LVEF of 60-65% with mild aortic stenosis and a mean gradient of 11 mmHg. He understands the implications of this and that we will continue to follow his aortic valve disease.  02/28/2017  [name] presents today with significant shortness of breath, weight gain and leg edema as well as increasing abdominal  girth over the past 2-3 weeks.  The symptoms came on gradually but clinically worsened.  Weight in August was 185 pounds, he now weighs 204 pounds.  Reports worsening shortness of breath, orthopnea and PND.  Blood pressure was also elevated today 183/75 and recently has been difficult to control.  His primary care provider recommended increasing his clonidine to 0.1 mg 3 times daily, however he has had significant daytime fatigue with this.  03/16/2017  [name] returns today for follow-up of heart failure.  His weight back in August was 185 pounds and he gained up to 204 pounds.  When I last saw him I doubled his diuretic and increased his blood pressure medication.  Over the past 2 weeks he has diuresed back down to 182 pounds.  He feels markedly better.  Blood pressure is better controlled today 132/58.  He feels like his energy level has improved.  We obtain blood work when he was in congestive heart failure which showed an elevated creatinine over 2 and a baseline around 1.6.  He was hypokalemic and I increased his potassium.  His echo showed an EF of 65-70% with grade 2  diastolic dysfunction and high LV filling pressures.  06/19/2017  [name] was seen today in follow-up.  I last saw him in January, at which time he was complaining of shortness of breath and edema.  Weight was up to 204 pounds.  He was given diuretics and his weight has come down significantly.  In fact, his creatinine rose up to 2.7 and weight was down to 182.  He saw his nephrologist, Dr. Hollie Salk, recently who decreased his Lasix further and started amlodipine 10 mg daily.  He now says over the past several months he has had progressive fatigue, decreased exercise tolerance and worsening shortness of breath.  He wonders if it is related to his aortic valve.  In January was noted to have normal systolic function and mild aortic stenosis.  On exam, he has what sounds like moderate left ear with an audible second heart sound.  He  again looks volume overloaded today and weight is back up to 193 pounds, up from 182 pounds.  He denies any chest pain.  01/02/2018  [name] returns today for follow-up of his heart failure.  Recently he has been seen by nephrology.  He felt that he is renal failure has been fairly stable.  Plans were to possibly resume ARB therapy.  His aortic stenosis is mild to moderate and followed by [place].  His EKG shows sinus rhythm at 65.  He denies any worsening shortness of breath.  Weight has been stable.  I had referred him for heart cath in April 2019 which showed two-vessel coronary disease with 3 out of 3 patent bypass grafts.  Medical therapy was recommended however it was felt to be volume overloaded.  Subsequently he was admitted with heart failure and diuresis.  His weight now is down significantly to 170 pounds from 182 most recently.  He seems to be feeling much better.  08/06/2019  [name] was again admitted for heart failure in February of this past year.  Ultimately he had progression of renal failure with hypokalemia and congestive heart failure and transition to peritoneal dialysis.  Since then he is done much better and is fairly stable.  Weight is come down to about 155 although measured 160 in our office today.  Blood pressure has been well controlled at home.  He denies any palpitations or chest pain.  He is noted to have a history of aortic stenosis which sounds mild to moderate on physical exam.  EKG personally reviewed today shows sinus rhythm.  01/09/2020  [name] returns today for follow-up.  More recently he has noted increasing fatigue and shortness of breath.  He had an echo earlier in the year which showed mild to moderate aortic stenosis however by exam it appeared to be more moderate to severe in my opinion.  We were going to schedule repeat echo earlier next year however I think he should have it sooner.  He denies any anginal symptoms.  Blood pressure is well controlled  today in fact at times he is log indicates his blood pressure may get a little too low.  This could be contributing to some symptoms of dizziness that he is reporting.  EKG today shows a sinus bradycardia with voltage criteria for LVH at 19.  07/14/2018  [name] is seen today in follow-up.  He reports his dizziness is improved somewhat.  I had decreased his hydralazine but also they made some changes in his dialysis.  He apparently does home hemodialysis through peritoneal catheter.  He is followed by Dr. Hollie Salk.  He had mild to moderate aortic stenosis with a mean gradient of 19 mmHg in December 2021.  He will need a repeat echo in about a year.  He underwent vascular lower extremity Dopplers in March after seeing Doreene Adas, PA-C and was then referred to Dr. Gwenlyn Found for PAD.  He was not felt to have critical limb ischemia or lifestyle limiting claudication and therefore medical therapy was recommended.  07/07/2021  [name] is seen today in follow-up.  He just saw Coletta Memos, NP about a month ago.  Overall seems like he was doing fairly well.  Heart failure seems stable.  He does have issues with ongoing dizziness which is his main complaint.  I do think this is multifactorial probably having to do with peripheral neuropathy/PAD.  He seems to be stable with regards to weight.  He did have a repeat echo which showed more moderate aortic stenosis.  Mean gradient about 24 mmHg.  On exam it sounds more moderate to severe with a faint second heart sound.  Is not clear if this is contributing to his dizziness but I suspect not.  He will need close follow-up though.  01/09/2022  [name] is seen today in follow-up.  He continues to have issues with dizziness.  He had a repeat echo to assess his aortic stenosis which is still moderate.  LV function is normal.  He had carotid Dopplers which showed no significant obstruction.  He has had issues with hypertension.  The blood pressure is quite high  today although he did not take his blood pressure meds.  Apparently there was a death of a family member last night and he had another family member die fairly recently.  There may be a lot more stress that he is under.  He said he did not eat at all today but he did have a Dexcom blood glucose meter on which was reading quite high, 271 in the office and 300 mg/dL this morning which was a fasting reading.  He is on long-acting insulin.  Blood sugars managed by his PCP.  He continues on home peritoneal dialysis.  PMHx:  Past Medical History:  Diagnosis Date   Aortic stenosis 02/27/2020   mild to moderate AS   Arthritis    "a little in LLE" (06/20/2017)   Ataxia    Chronic diastolic CHF (congestive heart failure) (HCC)    CKD (chronic kidney disease), stage IV ([place])    Coronary artery disease    a. s/p CABG 1980s. b. stable by cath 05/2017.   Dizziness    Dyslipidemia    Edema 08/08/2017   HANDS & LOWER EXTREMITES   Hypertension    Hypoalbuminemia    Nephrotic range proteinuria    Peripheral artery disease (HCC)    mild   Pneumonia ~ 1950 X 1   Prostate cancer ([place]) ~ 2015   Type II diabetes mellitus ([place])     Past Surgical History:  Procedure Laterality Date   CARDIAC CATHETERIZATION     "a couple times" (06/20/2017)   CATARACT EXTRACTION W/ INTRAOCULAR LENS  IMPLANT, BILATERAL Bilateral    COLONOSCOPY     CORONARY ARTERY BYPASS GRAFT  03/06/1985   LAD and first diagonal/circumflex (sequential saphenous vein graft,cardioplegia   INGUINAL HERNIA REPAIR Right 12/06/2020   Procedure: OPEN RIGHT INGUINAL HERNIA REPAIR;  Surgeon: Johnathan Hausen, MD;  Location: [facility];  Service: General;  Laterality: Right;   INSERTION PROSTATE RADIATION SEED  ~  2015   [place] MOTION  09/12/2006   no ischemia   RIGHT/LEFT HEART CATH AND CORONARY/GRAFT ANGIOGRAPHY N/A 06/21/2017   Procedure: RIGHT/LEFT HEART CATH AND CORONARY/GRAFT ANGIOGRAPHY;  Surgeon: Burnell Blanks, MD;   Location: [facility];  Service: Cardiovascular;  Laterality: N/A;   US ECHOCARDIOGRAPHY  11/04/2008   trace TR & MR    FAMHx:  Family History  Problem Relation Age of Onset   Heart attack Mother     SOCHx:   reports that he quit smoking about 49 years ago. His smoking use included cigarettes. He has a 20.00 pack-year smoking history. He has never used smokeless tobacco. He reports that he does not currently use alcohol. He reports that he does not use drugs.  ALLERGIES:  Allergies  Allergen Reactions   Carvedilol     Other reaction(s): dizziness   Clonidine Hcl     Other reaction(s): constipation   Colace [Docusate]     ineffective   Miralax [Polyethylene Glycol]     ineffective    ROS: Pertinent items noted in HPI and remainder of comprehensive ROS otherwise negative.  HOME MEDS: Current Outpatient Medications  Medication Sig Dispense Refill   amLODipine (NORVASC) 10 MG tablet Take 10 mg by mouth at bedtime.     aspirin EC 81 MG tablet Take 81 mg by mouth daily.     atorvastatin (LIPITOR) 80 MG tablet TAKE 1/2 TABLET BY MOUTH AT BEDTIME 45 tablet 3   Azelastine HCl 0.15 % SOLN Place 1 spray into both nostrils daily as needed (allergies).     B Complex-C (B COMPLEX-VITAMIN C) CAPS Take by mouth.     B Complex-C-Zn-Folic Acid (DIALYVITE 977 WITH ZINC) 0.8 MG TABS Take 1 tablet by mouth daily.     calcitRIOL (ROCALTROL) 0.25 MCG capsule Take 0.25 mcg by mouth 3 (three) times a week.     Carboxymethylcellulose Sod PF 0.5 % SOLN 1 drop into affected eye as needed     carvedilol (COREG) 3.125 MG tablet TAKE 1 TABLET BY MOUTH TWICE A DAY WITH A MEAL 414 tablet 1   folic acid (FOLVITE) 239 MCG tablet Take 800 mcg by mouth daily.     furosemide (LASIX) 80 MG tablet TAKE 1 TABLET (80 MG TOTAL) BY MOUTH 2 (TWO) TIMES DAILY AT 10 AM AND 5 PM. 180 tablet 3   gentamicin cream (GARAMYCIN) 0.1 % Apply topically daily.     glimepiride (AMARYL) 4 MG tablet Take 4 mg by mouth daily  as needed (diabetes).     hydrALAZINE (APRESOLINE) 50 MG tablet Take 1 tablet (50 mg total) by mouth in the morning and at bedtime. Please keep scheduled appointment 30 tablet 1   HYDROcodone-acetaminophen (NORCO/VICODIN) 5-325 MG tablet Take 1 tablet by mouth every 6 (six) hours as needed for moderate pain. 15 tablet 0   insulin glargine (LANTUS) 100 UNIT/ML injection Inject 13 Units into the skin at bedtime.     isosorbide mononitrate (IMDUR) 30 MG 24 hr tablet 1 tablet in the morning     KLOR-CON M10 10 MEQ tablet TAKE 1 TABLET BY MOUTH EVERY DAY 90 tablet 3   Multiple Vitamin (MULTIVITAMIN) tablet Take 1 tablet by mouth daily.      nitroGLYCERIN (NITROSTAT) 0.4 MG SL tablet Place 1 tablet (0.4 mg total) under the tongue every 5 (five) minutes x 3 doses as needed for chest pain. 25 tablet 4   OVER THE COUNTER MEDICATION Place 1 spray into  the nose daily as needed. Sovereign Silver Nasal Spray for Immune Support     Propylene Glycol (SYSTANE COMPLETE) 0.6 % SOLN Place 1 drop into both eyes daily as needed (Dry eye).     tolterodine (DETROL LA) 4 MG 24 hr capsule Take 4 mg by mouth daily.     TRADJENTA 5 MG TABS tablet Take 5 mg by mouth every morning.      traZODone (DESYREL) 50 MG tablet Take 50 mg by mouth at bedtime as needed for sleep.   12   sevelamer carbonate (RENVELA) 800 MG tablet Take 800 mg by mouth 5 (five) times daily. (Patient not taking: Reported on 01/09/2022)     No current facility-administered medications for this visit.    LABS/IMAGING: No results found for this or any previous visit (from the past 48 hour(s)).  No results found.  VITALS: BP (!) 192/64 (BP Location: Left Arm, Patient Position: Sitting)   Pulse 62   Ht '5\' 11"'$  (1.803 m)   Wt 181 lb 12.8 oz (82.5 kg)   SpO2 100%   BMI 25.36 kg/m   EXAM: General appearance: alert, no distress and moderately obese Neck: no carotid bruit, no JVD and thyroid not enlarged, symmetric, no tenderness/mass/nodules Lungs:  clear to auscultation bilaterally Heart: regular rate and rhythm, S1, S2 normal and systolic murmur: systolic ejection 3/6, crescendo at 2nd right intercostal space Abdomen: soft, non-tender; bowel sounds normal; no masses,  no organomegaly Extremities: extremities normal, atraumatic, no cyanosis or edema Pulses: 2+ and symmetric Skin: Skin color, texture, turgor normal. No rashes or lesions Neurologic: Grossly normal Psych: Pleasant  EKG: N/A  ASSESSMENT: 1.   Chronic diastolic congestive heart failure ESRD on PD Aortic stenosis Coronary artery disease status post CABG in 1987 -patent bypass grafts by cath (05/2017) Hypertension Hyperlipidemia Diabetes type 2 Mild PAD - intermittent claudication, not lifestyle limiting Possible rheumatoid arthritis  PLAN:  1.   [name] continues to have dizziness and other symptoms which are difficult to ascertain the etiology.  Blood pressure may be an issue as it was poorly controlled today but he said he did not take his meds.  He is on home PD.  His blood sugars today are very poorly controlled including a fasting blood glucose of 300 this morning.  I advised him to follow-up sooner than later with his primary care provider as he may need adjustments in his insulin.  His aortic stenosis appears to be stable and moderate based on the recent echo.  We can repeat that in 1 year.  Plan close follow-up with me in about 3 months or sooner as necessary.  Pixie Casino, MD, [facility], [place] Director of the Advanced Lipid Disorders &  Cardiovascular Risk Reduction Clinic Attending Cardiologist  Direct Dial: [phone]  Fax: [phone]  Website:  [email or web address] [email or web address] Ashmore 01/09/2022, 10:47 AM

## 2022-01-09 NOTE — Patient Instructions (Signed)
Medication Instructions:  The current medical regimen is effective;  continue present plan and medications.  *If you need a refill on your cardiac medications before your next appointment, please call your pharmacy*   Testing/Procedures: Echocardiogram (12 months) - Your physician has requested that you have an echocardiogram. Echocardiography is a painless test that uses sound waves to create images of your heart. It provides your doctor with information about the size and shape of your heart and how well your heart's chambers and valves are working. This procedure takes approximately one hour. There are no restrictions for this procedure.     Follow-Up: At [facility], you and your health needs are our priority.  As part of our continuing mission to provide you with exceptional heart care, we have created designated Provider Care Teams.  These Care Teams include your primary Cardiologist (physician) and Advanced Practice Providers (APPs -  Physician Assistants and Nurse Practitioners) who all work together to provide you with the care you need, when you need it.  We recommend signing up for the patient portal called "MyChart".  Sign up information is provided on this After Visit Summary.  MyChart is used to connect with patients for Virtual Visits (Telemedicine).  Patients are able to view lab/test results, encounter notes, upcoming appointments, etc.  Non-urgent messages can be sent to your provider as well.   To learn more about what you can do with MyChart, go to [email or web address].    Your next appointment:   3 month(s)  The format for your next appointment:   In Person  Provider:   Pixie Casino, MD

## 2022-01-10 ENCOUNTER — Other Ambulatory Visit: Payer: Self-pay

## 2022-01-10 MED ORDER — HYDRALAZINE HCL 50 MG PO TABS: 50.0000 mg | ORAL_TABLET | Freq: Two times a day (BID) | ORAL | 3 refills | Status: DC

## 2022-02-22 ENCOUNTER — Ambulatory Visit: Payer: Medicare Other | Admitting: Psychiatry

## 2022-02-23 NOTE — Progress Notes (Signed)
[facility]  [name] DOB: 09/25/1933  REFERRING CLINICIAN: Jani Gravel, MD HISTORY FROM: self and wife REASON FOR VISIT: dizziness, memory loss, headaches   HISTORICAL  CHIEF COMPLAINT:  Chief Complaint  Patient presents with   Follow-up    Patient in room #2 with his wife. Pt here today to f/u with his balance and dizzy.   Follow-up visit  Prior visit: 08/15/2021 with Dr. Billey Gosling (initial consult visit)   Brief HPI:  [name] is a 88 y.o. male who was evaluated by Dr. Billey Gosling on 08/15/2021 for complaints of dizziness/imbalance, headaches and memory loss since around 04/2021.    At prior visit, he was offered PT for imbalance but declined interest at this time. Completed brain MRI which showed atrophy in medial temporal lobes likely contributing to memory loss, otherwise unremarkable no clear cause of imbalance/dizziness.  Carotid ultrasound unremarkable.   Interval history:  Continues to have balance difficulties, relatively unchanged since prior visit.  Continue use of cane at all times, denies any recent falls.  He also continues to have occasional lightheadedness sensation.  He does have continued decrease sensation in L>R foot, unchanged since prior visit.  Occasional headaches, unchanged since prior visit.  Continue cognitive impairment, denies worsening since prior visit.  He continues to maintain ADLs independently.     OTHER MEDICAL CONDITIONS: CHF, HTN, ESRD on dialysis, CAD, diabetes, carotid stenosis   REVIEW OF SYSTEMS: Full 14 system review of systems performed and negative with exception of: Those listed in HPI  ALLERGIES: Allergies  Allergen Reactions   Carvedilol     Other reaction(s): dizziness   Clonidine Hcl     Other reaction(s): constipation   Colace [Docusate]     ineffective   Miralax [Polyethylene Glycol]     ineffective    HOME MEDICATIONS: Outpatient Medications Prior to Visit  Medication Sig  Dispense Refill   aspirin EC 81 MG tablet Take 81 mg by mouth daily.     atorvastatin (LIPITOR) 80 MG tablet TAKE 1/2 TABLET BY MOUTH AT BEDTIME 45 tablet 3   B Complex-C (B COMPLEX-VITAMIN C) CAPS Take by mouth.     B Complex-C-Zn-Folic Acid (DIALYVITE 258 WITH ZINC) 0.8 MG TABS Take 1 tablet by mouth daily.     Carboxymethylcellulose Sod PF 0.5 % SOLN 1 drop into affected eye as needed     carvedilol (COREG) 3.125 MG tablet TAKE 1 TABLET BY MOUTH TWICE A DAY WITH A MEAL 527 tablet 1   folic acid (FOLVITE) 782 MCG tablet Take 800 mcg by mouth daily.     furosemide (LASIX) 80 MG tablet TAKE 1 TABLET (80 MG TOTAL) BY MOUTH 2 (TWO) TIMES DAILY AT 10 AM AND 5 PM. 180 tablet 3   gentamicin cream (GARAMYCIN) 0.1 % Apply topically daily.     glimepiride (AMARYL) 4 MG tablet Take 4 mg by mouth daily as needed (diabetes).     hydrALAZINE (APRESOLINE) 50 MG tablet Take 1 tablet (50 mg total) by mouth in the morning and at bedtime. Please keep scheduled appointment 90 tablet 3   HYDROcodone-acetaminophen (NORCO/VICODIN) 5-325 MG tablet Take 1 tablet by mouth every 6 (six) hours as needed for moderate pain. 15 tablet 0   insulin glargine (LANTUS) 100 UNIT/ML injection Inject 13 Units into the skin at bedtime.     isosorbide mononitrate (IMDUR) 30 MG 24 hr tablet 1 tablet in the morning     KLOR-CON M10 10 MEQ tablet TAKE 1  TABLET BY MOUTH EVERY DAY 90 tablet 3   Multiple Vitamin (MULTIVITAMIN) tablet Take 1 tablet by mouth daily.      nitroGLYCERIN (NITROSTAT) 0.4 MG SL tablet Place 1 tablet (0.4 mg total) under the tongue every 5 (five) minutes x 3 doses as needed for chest pain. 25 tablet 4   OVER THE COUNTER MEDICATION Place 1 spray into the nose daily as needed. Sovereign Silver Nasal Spray for Immune Support     Propylene Glycol (SYSTANE COMPLETE) 0.6 % SOLN Place 1 drop into both eyes daily as needed (Dry eye).     sevelamer carbonate (RENVELA) 800 MG tablet Take 800 mg by mouth 5 (five) times daily.      tolterodine (DETROL LA) 4 MG 24 hr capsule Take 4 mg by mouth daily.     TRADJENTA 5 MG TABS tablet Take 5 mg by mouth every morning.      traZODone (DESYREL) 50 MG tablet Take 50 mg by mouth at bedtime as needed for sleep.   12   amLODipine (NORVASC) 10 MG tablet Take 10 mg by mouth at bedtime. (Patient not taking: Reported on 02/28/2022)     Azelastine HCl 0.15 % SOLN Place 1 spray into both nostrils daily as needed (allergies). (Patient not taking: Reported on 02/28/2022)     calcitRIOL (ROCALTROL) 0.25 MCG capsule Take 0.25 mcg by mouth 3 (three) times a week. (Patient not taking: Reported on 02/28/2022)     No facility-administered medications prior to visit.    PAST MEDICAL HISTORY: Past Medical History:  Diagnosis Date   Aortic stenosis 02/27/2020   mild to moderate AS   Arthritis    "a little in LLE" (06/20/2017)   Ataxia    Chronic diastolic CHF (congestive heart failure) (HCC)    CKD (chronic kidney disease), stage IV ([place])    Coronary artery disease    a. s/p CABG 1980s. b. stable by cath 05/2017.   Dizziness    Dyslipidemia    Edema 08/08/2017   HANDS & LOWER EXTREMITES   Hypertension    Hypoalbuminemia    Nephrotic range proteinuria    Peripheral artery disease (HCC)    mild   Pneumonia ~ 1950 X 1   Prostate cancer ([place]) ~ 2015   Type II diabetes mellitus ([place])     PAST SURGICAL HISTORY: Past Surgical History:  Procedure Laterality Date   CARDIAC CATHETERIZATION     "a couple times" (06/20/2017)   CATARACT EXTRACTION W/ INTRAOCULAR LENS  IMPLANT, BILATERAL Bilateral    COLONOSCOPY     CORONARY ARTERY BYPASS GRAFT  03/06/1985   LAD and first diagonal/circumflex (sequential saphenous vein graft,cardioplegia   INGUINAL HERNIA REPAIR Right 12/06/2020   Procedure: OPEN RIGHT INGUINAL HERNIA REPAIR;  Surgeon: Johnathan Hausen, MD;  Location: [facility];  Service: General;  Laterality: Right;   INSERTION PROSTATE RADIATION SEED  ~ 2015   NM MYOCAR PERF WALL MOTION   09/12/2006   no ischemia   RIGHT/LEFT HEART CATH AND CORONARY/GRAFT ANGIOGRAPHY N/A 06/21/2017   Procedure: RIGHT/LEFT HEART CATH AND CORONARY/GRAFT ANGIOGRAPHY;  Surgeon: Burnell Blanks, MD;  Location: [facility];  Service: Cardiovascular;  Laterality: N/A;   US ECHOCARDIOGRAPHY  11/04/2008   trace TR & MR    FAMILY HISTORY: Family History  Problem Relation Age of Onset   Heart attack Mother     SOCIAL HISTORY: Social History   Socioeconomic History   Marital status: Married    Spouse name: [name]  Number of children: Not on file   Years of education: Not on file   Highest education level: Not on file  Occupational History   Occupation: Retired  Tobacco Use   Smoking status: Former    Packs/day: 1.00    Years: 20.00    Total pack years: 20.00    Types: Cigarettes    Quit date: 08/22/1972    Years since quitting: 49.5   Smokeless tobacco: Never  Vaping Use   Vaping Use: Never used  Substance and Sexual Activity   Alcohol use: Not Currently    Comment: 06/20/2017 "nothing since ~ 1974"   Drug use: No   Sexual activity: Not Currently  Other Topics Concern   Not on file  Social History Narrative   Lives with wife   Right handed   Caffeine- 1 cup some days    Social Determinants of Health   Financial Resource Strain: Not on file  Food Insecurity: No Food Insecurity (10/31/2019)   Hunger Vital Sign    Worried About Running Out of Food in the Last Year: Never true    [place] in the Last Year: Never true  Transportation Needs: No Transportation Needs (10/31/2019)   PRAPARE - Hydrologist (Medical): No    Lack of Transportation (Non-Medical): No  Physical Activity: Sufficiently Active (10/31/2019)   Exercise Vital Sign    Days of Exercise per Week: 7 days    Minutes of Exercise per Session: 40 min  Stress: Not on file  Social Connections: Not on file  Intimate Partner Violence: Not on file     PHYSICAL  EXAM   GENERAL EXAM/CONSTITUTIONAL: Vitals:  Vitals:   02/28/22 1514  BP: 128/60  Pulse: 63  Weight: 171 lb 12.8 oz (77.9 kg)  Height: '5\' 7"'$  (1.702 m)    Body mass index is 26.91 kg/m. Wt Readings from Last 3 Encounters:  02/28/22 171 lb 12.8 oz (77.9 kg)  01/09/22 181 lb 12.8 oz (82.5 kg)  10/25/21 175 lb 12.8 oz (79.7 kg)   NEUROLOGIC: MENTAL STATUS:     08/15/2021   11:11 AM  Montreal Cognitive Assessment   Visuospatial/ Executive (0/5) 1  Naming (0/3) 3  Attention: Read list of digits (0/2) 1  Attention: Read list of letters (0/1) 1  Attention: Serial 7 subtraction starting at 100 (0/3) 3  Language: Repeat phrase (0/2) 0  Language : Fluency (0/1) 0  Abstraction (0/2) 1  Delayed Recall (0/5) 1  Orientation (0/6) 6  Total 17    CRANIAL NERVE:  2nd, 3rd, 4th, 6th - pupils equal and reactive to light, visual fields full to confrontation, extraocular muscles intact, no nystagmus 5th - facial sensation symmetric 7th - facial strength symmetric 8th - hearing intact 9th - palate elevates symmetrically, uvula midline 11th - shoulder shrug symmetric 12th - tongue protrusion midline  MOTOR:  normal bulk and tone, 5/5 strength throughout, however he requires support from his arms when changing from sitting to standing.  SENSORY:  Decreased sensation to vibration in bilateral feet up to knees, pinprick and proprioception intact  COORDINATION:  finger-nose-finger, fine finger movements normal  REFLEXES:  deep tendon reflexes present and symmetric  GAIT/STATION:  Decreased stride length and step height bilaterally, unsteadiness, occasional swaying, use of single-point cane      DIAGNOSTIC DATA (LABS, IMAGING, TESTING) - I reviewed patient records, labs, notes, testing and imaging myself where available.  MRI brain wo contrast 08/27/2021 IMPRESSION: This  MRI of the brain without contrast shows the following: 1.   Moderate generalized cortical atrophy most  pronounced in the medial temporal lobes and the insular cortex. 2.   Extensive single and confluent T2/FLAIR hyperintense foci in the hemispheres most consistent with moderate chronic microvascular ischemic change.  None of the foci appear to be acute. 3.   Left maxillary mucocele. 4.   No acute findings.  Carotid ultrasound 10/25/2021 Summary:  Right Carotid: The extracranial vessels were near-normal with only minimal wall thickening or plaque.  Left Carotid: The extracranial vessels were near-normal with only minimal wall thickening or plaque.  Vertebrals:  Bilateral vertebral arteries demonstrate antegrade flow.  Subclavians: Normal flow hemodynamics were seen in bilateral subclavian arteries.   Lab Results  Component Value Date   WBC 7.0 07/05/2021   HGB 12.3 (L) 07/05/2021   HCT 35.9 (L) 07/05/2021   MCV 98 (H) 07/05/2021   PLT 192 07/05/2021      Component Value Date/Time   NA 143 07/05/2021 1045   K 3.5 07/05/2021 1045   CL 94 (L) 07/05/2021 1045   CO2 34 (H) 07/05/2021 1045   GLUCOSE 148 (H) 07/05/2021 1045   GLUCOSE 188 (H) 12/06/2020 1244   BUN 39 (H) 07/05/2021 1045   CREATININE 5.16 (H) 07/05/2021 1045   CALCIUM 9.4 07/05/2021 1045   PROT 6.3 07/05/2021 1045   ALBUMIN 3.8 07/05/2021 1045   AST 31 07/05/2021 1045   ALT 21 07/05/2021 1045   ALKPHOS 59 07/05/2021 1045   BILITOT 0.4 07/05/2021 1045   GFRNONAA 11 (L) 12/06/2020 1244   GFRAA 16 (L) 04/25/2019 0441   Lab Results  Component Value Date   CHOL 170 07/05/2021   HDL 72 07/05/2021   LDLCALC 84 07/05/2021   TRIG 72 07/05/2021   CHOLHDL 2.4 07/05/2021   Lab Results  Component Value Date   HGBA1C 7.6 (H) 12/06/2020   No results found for: "VITAMINB12" Lab Results  Component Value Date   TSH 4.171 08/08/2017    06/15/21 TSH 3.99 05/10/21: A1c 6.9 07/2021 B12 wnl   ASSESSMENT AND PLAN  88 y.o. year old male with a history of CHF, HTN, ESRD on dialysis, CAD, diabetes who presents for evaluation  of imbalance, headaches, and memory loss over the past few months.  Evaluated by Dr. Billey Gosling on 08/15/2021.  MRI brain showed moderate generalized cortical atrophy most pronounced in medial temporal lobes and insular cortex as well as moderate chronic microvascular ischemic changes.  Carotid ultrasound unremarkable.  It was suspected peripheral neuropathy likely contributing to imbalance.  Declined interest in PT at prior visit.   1. Imbalance   2. Dizziness, nonspecific   3. Neuropathy   4. Memory loss      PLAN: -Referral placed to PT, continued use of cane at all times unless otherwise instructed -unclear cause of persistent symptoms although suspect imbalance and dizziness multifactorial -memory stable, continue to monitor  -headaches occasionally, no indication for prophylactic therapy at this time, continue to monitor    Orders Placed This Encounter  Procedures   Ambulatory referral to Physical Therapy    No orders of the defined types were placed in this encounter.   Return in about 3 months (around 05/30/2022) for with Dr. Billey Gosling . To further discuss symptoms after completion of PT   I spent 26 minutes of face-to-face and non-face-to-face time with patient and wife.  This included previsit chart review, lab review, study review, order entry, electronic health record documentation, patient  and wife education and discussion regarding the above diagnoses and treatment plan and answered all questions to patient and wife's satisfaction  Frann Rider, [facility]  [facility] [address] [place], [place] [address]  Phone [phone] Fax [phone] Note: This document was prepared with digital dictation and possible smart phrase technology. Any transcriptional errors that result from this process are unintentional.

## 2022-02-28 ENCOUNTER — Ambulatory Visit: Payer: Medicare Other | Admitting: Adult Health

## 2022-02-28 ENCOUNTER — Telehealth: Payer: Self-pay | Admitting: Adult Health

## 2022-02-28 ENCOUNTER — Encounter: Payer: Self-pay | Admitting: Adult Health

## 2022-02-28 VITALS — BP 128/60 | HR 63 | Ht 67.0 in | Wt 171.8 lb

## 2022-02-28 DIAGNOSIS — R2689 Other abnormalities of gait and mobility: Secondary | ICD-10-CM | POA: Diagnosis not present

## 2022-02-28 DIAGNOSIS — G629 Polyneuropathy, unspecified: Secondary | ICD-10-CM

## 2022-02-28 DIAGNOSIS — R42 Dizziness and giddiness: Secondary | ICD-10-CM

## 2022-02-28 DIAGNOSIS — R413 Other amnesia: Secondary | ICD-10-CM

## 2022-02-28 NOTE — Patient Instructions (Signed)
Your Plan:  Referral placed to start physical therapy - please call them if you do not hear from them by Thursday/Friday to schedule     Follow up in 3 months with Dr. Billey Gosling or call earlier if needed       Thank you for coming to see [place] at [facility]. I hope we have been able to provide you high quality care today.  You may receive a patient satisfaction survey over the next few weeks. We would appreciate your feedback and comments so that we may continue to improve ourselves and the health of our patients.

## 2022-02-28 NOTE — Telephone Encounter (Signed)
Referral for Physical Therapy sent through EPIC to [facility]. [phone] 

## 2022-03-08 ENCOUNTER — Encounter: Payer: Self-pay | Admitting: Rehabilitation

## 2022-03-08 ENCOUNTER — Ambulatory Visit: Payer: Medicare Other | Attending: Adult Health | Admitting: Rehabilitation

## 2022-03-08 ENCOUNTER — Other Ambulatory Visit: Payer: Self-pay

## 2022-03-08 DIAGNOSIS — R2689 Other abnormalities of gait and mobility: Secondary | ICD-10-CM | POA: Insufficient documentation

## 2022-03-08 DIAGNOSIS — R42 Dizziness and giddiness: Secondary | ICD-10-CM | POA: Insufficient documentation

## 2022-03-08 DIAGNOSIS — R293 Abnormal posture: Secondary | ICD-10-CM | POA: Diagnosis present

## 2022-03-08 DIAGNOSIS — M6281 Muscle weakness (generalized): Secondary | ICD-10-CM | POA: Diagnosis present

## 2022-03-08 DIAGNOSIS — R2681 Unsteadiness on feet: Secondary | ICD-10-CM | POA: Insufficient documentation

## 2022-03-08 DIAGNOSIS — G629 Polyneuropathy, unspecified: Secondary | ICD-10-CM | POA: Diagnosis not present

## 2022-03-08 NOTE — Therapy (Signed)
OUTPATIENT PHYSICAL THERAPY NEURO EVALUATION   Patient Name: [name] MRN: [id number] DOB:[birth year], 89 y.o., male Today's Date: 03/08/2022   PCP: Deon Pilling, NP  REFERRING PROVIDER: Frann Rider, MD   END OF SESSION:  PT End of Session - 03/08/22 1116     Visit Number 1    Number of Visits 17    Date for PT Re-Evaluation 05/07/22    Authorization Type [employer] Medicare (PN needed)    Progress Note Due on Visit 10    PT Start Time 1107    PT Stop Time 1153    PT Time Calculation (min) 46 min    Activity Tolerance Patient tolerated treatment well    Behavior During Therapy WFL for tasks assessed/performed             Past Medical History:  Diagnosis Date   Aortic stenosis 02/27/2020   mild to moderate AS   Arthritis    "a little in LLE" (06/20/2017)   Ataxia    Chronic diastolic CHF (congestive heart failure) ([place])    CKD (chronic kidney disease), stage IV ([place])    Coronary artery disease    a. s/p CABG 1980s. b. stable by cath 05/2017.   Dizziness    Dyslipidemia    Edema 08/08/2017   HANDS & LOWER EXTREMITES   Hypertension    Hypoalbuminemia    Nephrotic range proteinuria    Peripheral artery disease (HCC)    mild   Pneumonia ~ 1950 X 1   Prostate cancer ([place]) ~ 2015   Type II diabetes mellitus ([place])    Past Surgical History:  Procedure Laterality Date   CARDIAC CATHETERIZATION     "a couple times" (06/20/2017)   CATARACT EXTRACTION W/ INTRAOCULAR LENS  IMPLANT, BILATERAL Bilateral    COLONOSCOPY     CORONARY ARTERY BYPASS GRAFT  03/06/1985   LAD and first diagonal/circumflex (sequential saphenous vein graft,cardioplegia   INGUINAL HERNIA REPAIR Right 12/06/2020   Procedure: OPEN RIGHT INGUINAL HERNIA REPAIR;  Surgeon: Johnathan Hausen, MD;  Location: [facility];  Service: General;  Laterality: Right;   INSERTION PROSTATE RADIATION SEED  ~ 2015   NM MYOCAR PERF WALL MOTION  09/12/2006   no ischemia   RIGHT/LEFT HEART CATH AND CORONARY/GRAFT ANGIOGRAPHY  N/A 06/21/2017   Procedure: RIGHT/LEFT HEART CATH AND CORONARY/GRAFT ANGIOGRAPHY;  Surgeon: Burnell Blanks, MD;  Location: [facility];  Service: Cardiovascular;  Laterality: N/A;   US ECHOCARDIOGRAPHY  11/04/2008   trace TR & MR   Patient Active Problem List   Diagnosis Date Noted   Hypokalemia    ESRD needing dialysis ([place])    Acute coronary syndrome ([place]) 04/18/2019   Hypoxia 08/15/2017   Nephrotic range proteinuria    Difficulty urinating    Acute on chronic diastolic (congestive) heart failure (HCC) 08/08/2017   SOB (shortness of breath) on exertion 06/20/2017   Other fatigue 06/19/2017   SOB (shortness of breath) 06/19/2017   Congestive heart failure ([place]) 02/28/2017   Peripheral artery disease (HCC)    Hypertension    DM (diabetes mellitus) ([place])    Coronary artery disease    Aortic valve stenosis 10/16/2016   Uncontrolled hypersomnia 10/07/2015   Acute kidney injury superimposed on chronic kidney disease ([place]) 09/18/2014   Murmur 09/12/2013   Dizziness 08/22/2012   CAD (coronary artery disease) of artery bypass graft 08/22/2012   Hx of CABG 08/22/2012   Essential hypertension 08/22/2012   Dyslipidemia 08/22/2012   PAD (peripheral artery  disease) ([place]) 08/22/2012   DM2 (diabetes mellitus, type 2) ([place]) 08/22/2012    ONSET DATE: 02/28/2022  REFERRING DIAG: R26.89 (ICD-10-CM) - Imbalance R42 (ICD-10-CM) - Dizziness, nonspecific G62.9 (ICD-10-CM) - Neuropathy  THERAPY DIAG:  Unsteadiness on feet  Muscle weakness (generalized)  Abnormal posture  Rationale for Evaluation and Treatment: Rehabilitation  SUBJECTIVE:                                                                                                                                                                                             SUBJECTIVE STATEMENT: Per pt report, "Im dizzy when I wake up and then I'm dizzy the rest of the day.  I've had brain scans and all sorts of tests, and  they can't find anything really.  This has been going on for about 6 months.  Reports he has cardiac issues and will eventually need valve replacement."   Pt accompanied by: significant other [name])  PERTINENT HISTORY: CHF, HTN, ESRD on dialysis (Does at home every night), CAD, diabetes, carotid stenosis    PAIN:  Are you having pain? No  PRECAUTIONS: Fall  WEIGHT BEARING RESTRICTIONS: No  FALLS: Has patient fallen in last 6 months? No Reports many near falls, but stays near furniture"  LIVING ENVIRONMENT: Lives with: lives with their spouse Lives in: House/apartment Stairs: Yes: External: 8 steps; on right going up Has following equipment at home: Single point cane, shower chair, and toilet riser, grab bar in shower (tub/shower) , uses rollator when walking for exercise in house.   PLOF: Independent with basic ADLs, Independent with household mobility with device, and Independent with community mobility with device  PATIENT GOALS: "Get back to where I was."   OBJECTIVE:   DIAGNOSTIC FINDINGS:   COGNITION: Overall cognitive status: History of cognitive impairments - at baseline   SENSATION: Light touch: Impaired  Neuropathy in toes only   COORDINATION: WFL heel to shin  EDEMA:  Reports some swelling, intermittent due to CHF         POSTURE: rounded shoulders, forward head, increased thoracic kyphosis, and posterior pelvic tilt  LOWER EXTREMITY ROM:      Right Eval Left Eval  Hip flexion    Hip extension    Hip abduction    Hip adduction    Hip internal rotation    Hip external rotation    Knee flexion    Knee extension    Ankle dorsiflexion    Ankle plantarflexion    Ankle inversion    Ankle eversion     (Blank rows = not tested)Grossly WFL   LOWER EXTREMITY MMT:  MMT Right Eval Left Eval  Hip flexion 3+/5 3+/5  Hip extension    Hip abduction 4/5 4/5  Hip adduction 4/5 4/5  Hip internal rotation    Hip external rotation    Knee flexion  3+/5 4/5  Knee extension 4/5 4/5  Ankle dorsiflexion 3+/5 3+/5  Ankle plantarflexion    Ankle inversion    Ankle eversion    (Blank rows = not tested)  BED MOBILITY:  No issues reported   TRANSFERS: Assistive device utilized: Single point cane and None  Sit to stand: SBA and CGA Stand to sit: SBA Chair to chair: SBA Floor:  n/a   STAIRS: Level of Assistance: CGA Stair Negotiation Technique: Step to Pattern Forwards with Single Rail on Right Number of Stairs: 4  Height of Stairs: 6  Comments: Provided min cues for descending with RLE (or trying) to avoid feeling like R leg is giving way.  He initially wants to descend with LLE.    GAIT: Gait pattern: step to pattern, step through pattern, decreased stride length, decreased hip/knee flexion- Right, decreased hip/knee flexion- Left, decreased ankle dorsiflexion- Right, decreased ankle dorsiflexion- Left, shuffling, trunk flexed, poor foot clearance- Right, and poor foot clearance- Left Distance walked: 100' into/out of clinic and another 71' in session.  Assistive device utilized: Single point cane Level of assistance: SBA and CGA Comments: Pt does well in straight forward path, however when needing to turn, he does get off balance needing CGA at times to steady.   FUNCTIONAL TESTS:  5 times sit to stand: 19.57 secs without support but with momentum and sometimes using backs of legs on mat for stability.  Timed up and go (TUG): 29.09 secs with cane  10 meter walk test: 1.35 ft/sec with cane    TODAY'S TREATMENT:                                                                                                                              DATE: None initiated today     PATIENT EDUCATION: Education details: Educated on evaluation results, POC, goals and the role of vestibular hypofunction with balance.  Person educated: Patient and Spouse Education method: Explanation, Demonstration, and Verbal cues Education comprehension:  verbalized understanding and needs further education  HOME EXERCISE PROGRAM: None initiated today   GOALS: Goals reviewed with patient? Yes  SHORT TERM GOALS: Target date: 04/08/22  Pt will be IND with initial HEP in order to indicate improved functional mobility and dec fall risk. Baseline: Goal status: INITIAL  2.  Pt will improve gait speed to >/=1.8 ft/sec w/ SPC in order to indicate dec fall risk.  Baseline: 1.35 ft/sec with cane  Goal status: INITIAL  3.  Pt will improve 5TSS to </=16 secs without UE support only in order to indicate dec fall risk and improved functional strength.   Baseline: 19.57 secs without UE support but with momentum and slight use of mat behind him.  Goal status: INITIAL  4.  Will assess BERG balance test and improve score by 4 points from baseline in order to indicate reduced fall risk  Baseline:  Goal status: INITIAL  5.  Pt will improve TUG to </=25 secs w/ LRAD at S level in order to indicate dec fall risk   Baseline: 29 secs with SPC and CGA with turn Goal status: INITIAL  6.  Pt will negotiate up/down 4 steps with single rail/cane, up/down ramp and curb and ambulate x 500' outdoors over unlevel paved surfaces at S level (with rollator outdoors for energy conservation) in order to indicate improved community mobility.   Baseline:  Goal status: INITIAL  LONG TERM GOALS: Target date: 05/07/22  Pt will be IND with final HEP in order to indicate improved functional mobility and dec fall risk. Baseline:  Goal status: INITIAL  2.  Pt will improve gait speed to >/=2.62 ft/sec w/ LRAD in order to indicate dec fall risk.  Baseline:  Goal status: INITIAL  3.  Pt will improve BERG balance test score by 8 points from baseline in order to indicate dec fall risk . Baseline:  Goal status: INITIAL  4.  Pt will improve 5TSS to </=13 secs without UE support only in order to indicate dec fall risk and improved functional strength.   Baseline:   Goal status: INITIAL  5.  Pt will improve TUG to </=20 secs w/ LRAD at S level in order to indicate dec fall risk   Baseline:  Goal status: INITIAL  6.  Pt will negotiate up/down 4 steps with single rail/cane, up/down ramp and curb and ambulate x 500' outdoors over unlevel paved surfaces at [employer] I (with rollator from cardiac standpoint) level in order to indicate improved community mobility.   Baseline:  Goal status: INITIAL  ASSESSMENT:  CLINICAL IMPRESSION: Patient is a 89 y.o. male who was seen today for physical therapy evaluation and treatment for imbalance and reports of dizziness.  Note history of CHF, HTN, ESRD on dialysis, CAD, diabetes, carotid stenosis.  He reports he is supposed to get a valve replacement surgery at some point in the future but has not been scheduled at this time.  Upon further investigation, his dizziness seems to be more related to vestibular hypofunction rather than vertigo, but will continue to monitor.  Will also want to monitor vitals with exercise as well to ensure its not cardiac related.  Upon PT evaluation, note decreased gait speed at 1.35 ft/sec indicative of recurrent fall risk, TUG time of 29 secs, also indicative of high fall risk and reduced functional speed with 5TSS of 19 secs.  Pt will benefit from skilled OP neuro in order to address deficits.     OBJECTIVE IMPAIRMENTS: Abnormal gait, cardiopulmonary status limiting activity, decreased activity tolerance, decreased balance, decreased endurance, decreased knowledge of use of DME, decreased mobility, difficulty walking, decreased strength, dizziness, impaired perceived functional ability, impaired flexibility, and postural dysfunction.   ACTIVITY LIMITATIONS: lifting, bending, standing, squatting, stairs, transfers, and locomotion level  PARTICIPATION LIMITATIONS: driving, community activity, and yard work  PERSONAL FACTORS: Age and 3+ comorbidities: see above  are also affecting patient's  functional outcome.   REHAB POTENTIAL: Good  CLINICAL DECISION MAKING: Evolving/moderate complexity  EVALUATION COMPLEXITY: Moderate  PLAN:  PT FREQUENCY: 2x/week  PT DURATION: 8 weeks  PLANNED INTERVENTIONS: Therapeutic exercises, Therapeutic activity, Neuromuscular re-education, Balance training, Gait training, Patient/Family education, Self Care, Stair training, Vestibular training, Canalith repositioning, DME instructions, and Aquatic Therapy  PLAN FOR NEXT SESSION: BERG, initiate HEP for BLE strength (sit<>stand, hip abd/ext) also incorporate balance with emphasis on head/body turns.  Add those to HEP as well. Keep an eye on vitals with prolonged exercise, work on endurance, gait outdoors with rollator (he wants to get back to walking outdoors on incline with rollator)    Cameron Sprang, PT, MPT [facility] [address] [place], [place], [address] Phone: [phone]   Fax:  [phone] 03/08/22, 1:30 PM

## 2022-03-12 ENCOUNTER — Other Ambulatory Visit: Payer: Self-pay | Admitting: Internal Medicine

## 2022-03-13 ENCOUNTER — Ambulatory Visit: Payer: Medicare Other | Admitting: Physical Therapy

## 2022-03-13 VITALS — BP 126/70 | HR 68

## 2022-03-13 DIAGNOSIS — R2681 Unsteadiness on feet: Secondary | ICD-10-CM | POA: Diagnosis not present

## 2022-03-13 DIAGNOSIS — R42 Dizziness and giddiness: Secondary | ICD-10-CM

## 2022-03-13 NOTE — Therapy (Signed)
OUTPATIENT PHYSICAL THERAPY NEURO TREATMENT   Patient Name: [name] MRN: [id number] DOB:October 02, 1933, 89 y.o., male Today's Date: 03/13/2022   PCP: Deon Pilling, NP  REFERRING PROVIDER: Frann Rider, MD   END OF SESSION:  PT End of Session - 03/13/22 1454     Visit Number 2    Number of Visits 17    Date for PT Re-Evaluation 05/07/22    Authorization Type UHC Medicare (PN needed)    Progress Note Due on Visit 10    PT Start Time 1452   Pt arrived late   PT Stop Time 1530    PT Time Calculation (min) 38 min    Equipment Utilized During Treatment Gait belt    Activity Tolerance Patient tolerated treatment well    Behavior During Therapy WFL for tasks assessed/performed             Past Medical History:  Diagnosis Date   Aortic stenosis 02/27/2020   mild to moderate AS   Arthritis    "a little in LLE" (06/20/2017)   Ataxia    Chronic diastolic CHF (congestive heart failure) ([place])    CKD (chronic kidney disease), stage IV ([place])    Coronary artery disease    a. s/p CABG 1980s. b. stable by cath 05/2017.   Dizziness    Dyslipidemia    Edema 08/08/2017   HANDS & LOWER EXTREMITES   Hypertension    Hypoalbuminemia    Nephrotic range proteinuria    Peripheral artery disease (HCC)    mild   Pneumonia ~ 1950 X 1   Prostate cancer ([place]) ~ 2015   Type II diabetes mellitus ([place])    Past Surgical History:  Procedure Laterality Date   CARDIAC CATHETERIZATION     "a couple times" (06/20/2017)   CATARACT EXTRACTION W/ INTRAOCULAR LENS  IMPLANT, BILATERAL Bilateral    COLONOSCOPY     CORONARY ARTERY BYPASS GRAFT  03/06/1985   LAD and first diagonal/circumflex (sequential saphenous vein graft,cardioplegia   INGUINAL HERNIA REPAIR Right 12/06/2020   Procedure: OPEN RIGHT INGUINAL HERNIA REPAIR;  Surgeon: Johnathan Hausen, MD;  Location: [facility];  Service: General;  Laterality: Right;   INSERTION PROSTATE RADIATION SEED  ~ 2015   NM MYOCAR PERF WALL MOTION  09/12/2006   no  ischemia   RIGHT/LEFT HEART CATH AND CORONARY/GRAFT ANGIOGRAPHY N/A 06/21/2017   Procedure: RIGHT/LEFT HEART CATH AND CORONARY/GRAFT ANGIOGRAPHY;  Surgeon: Burnell Blanks, MD;  Location: [facility];  Service: Cardiovascular;  Laterality: N/A;   US ECHOCARDIOGRAPHY  11/04/2008   trace TR & MR   Patient Active Problem List   Diagnosis Date Noted   Hypokalemia    ESRD needing dialysis ([place])    Acute coronary syndrome ([place]) 04/18/2019   Hypoxia 08/15/2017   Nephrotic range proteinuria    Difficulty urinating    Acute on chronic diastolic (congestive) heart failure (HCC) 08/08/2017   SOB (shortness of breath) on exertion 06/20/2017   Other fatigue 06/19/2017   SOB (shortness of breath) 06/19/2017   Congestive heart failure ([place]) 02/28/2017   Peripheral artery disease (HCC)    Hypertension    DM (diabetes mellitus) ([place])    Coronary artery disease    Aortic valve stenosis 10/16/2016   Uncontrolled hypersomnia 10/07/2015   Acute kidney injury superimposed on chronic kidney disease ([place]) 09/18/2014   Murmur 09/12/2013   Dizziness 08/22/2012   CAD (coronary artery disease) of artery bypass graft 08/22/2012   Hx of CABG 08/22/2012  Essential hypertension 08/22/2012   Dyslipidemia 08/22/2012   PAD (peripheral artery disease) ([place]) 08/22/2012   DM2 (diabetes mellitus, type 2) ([place]) 08/22/2012    ONSET DATE: 02/28/2022  REFERRING DIAG: R26.89 (ICD-10-CM) - Imbalance R42 (ICD-10-CM) - Dizziness, nonspecific G62.9 (ICD-10-CM) - Neuropathy  THERAPY DIAG:  Unsteadiness on feet  Dizziness and giddiness  Rationale for Evaluation and Treatment: Rehabilitation  SUBJECTIVE:                                                                                                                                                                                             SUBJECTIVE STATEMENT: Pt reports his R knee has started to "go out" on him when he goes up stairs. States his  dizziness comes and goes, happens mostly if he moves too quickly. Blood glucose monitor is beeping, glucose reading at 214 mm/dL. Pt states he did not take his meds today    Pt accompanied by: significant other [name])  PERTINENT HISTORY: CHF, HTN, ESRD on dialysis (Does at home every night), CAD, diabetes, carotid stenosis    PAIN:  Are you having pain? No  VITALS Vitals:   03/13/22 1510 03/13/22 1514  BP: (!) 153/63 126/70  Pulse: 60 68     PRECAUTIONS: Fall  WEIGHT BEARING RESTRICTIONS: No  FALLS: Has patient fallen in last 6 months? No Reports many near falls, but stays near furniture"  LIVING ENVIRONMENT: Lives with: lives with their spouse Lives in: House/apartment Stairs: Yes: External: 8 steps; on right going up Has following equipment at home: Single point cane, shower chair, and toilet riser, grab bar in shower (tub/shower) , uses rollator when walking for exercise in house.   PLOF: Independent with basic ADLs, Independent with household mobility with device, and Independent with community mobility with device  PATIENT GOALS: "Get back to where I was."   OBJECTIVE:   COGNITION: Overall cognitive status: History of cognitive impairments - at baseline   SENSATION: Light touch: Impaired  Neuropathy in toes only    POSTURE: rounded shoulders, forward head, increased thoracic kyphosis, and posterior pelvic tilt   LOWER EXTREMITY MMT:    MMT Right Eval Left Eval  Hip flexion 3+/5 3+/5  Hip extension    Hip abduction 4/5 4/5  Hip adduction 4/5 4/5  Hip internal rotation    Hip external rotation    Knee flexion 3+/5 4/5  Knee extension 4/5 4/5  Ankle dorsiflexion 3+/5 3+/5  Ankle plantarflexion    Ankle inversion    Ankle eversion    (Blank rows = not tested)  TODAY'S TREATMENT:    Ther Act  Checked BP  in seated position and assessed for orthostatics (see above). Pt's systolic BP orthostatic but pt denied feelings of  lightheadedness/dizziness Discussed proper blood glucose parameters <100 mg/dL, as pt reports he frequently reaches >300-400 mm/dL prior to taking insulin. Discussed importance of proper blood glucose numbers and the effect high blood sugar can have on balance and dizziness. Strongly encouraged pt to take his medicine prior to coming to PT and ensuring his blood glucose is WNL prior to arriving. Pt and wife verbalized understanding   Vestibular Assessment:  Head impulse test: positive on L side  Horizontal VOR: normal    GAIT: Gait pattern: step to pattern, step through pattern, decreased stride length, decreased hip/knee flexion- Right, decreased hip/knee flexion- Left, decreased ankle dorsiflexion- Right, decreased ankle dorsiflexion- Left, shuffling, trunk flexed, poor foot clearance- Right, and poor foot clearance- Left Distance walked: Various clinic distances  Assistive device utilized: Single point cane Level of assistance: SBA and CGA Comments: Pt does well in straight forward path, however when needing to turn, he does get off balance needing CGA at times to steady.      PATIENT EDUCATION: Education details: See above Person educated: Patient and Spouse Education method: Explanation, Demonstration, and Verbal cues Education comprehension: verbalized understanding and needs further education  HOME EXERCISE PROGRAM: None initiated today   GOALS: Goals reviewed with patient? Yes  SHORT TERM GOALS: Target date: 04/08/22  Pt will be IND with initial HEP in order to indicate improved functional mobility and dec fall risk. Baseline: Goal status: INITIAL  2.  Pt will improve gait speed to >/=1.8 ft/sec w/ SPC in order to indicate dec fall risk.  Baseline: 1.35 ft/sec with cane  Goal status: INITIAL  3.  Pt will improve 5TSS to </=16 secs without UE support only in order to indicate dec fall risk and improved functional strength.   Baseline: 19.57 secs without UE support but  with momentum and slight use of mat behind him.  Goal status: INITIAL  4.  Will assess BERG balance test and improve score by 4 points from baseline in order to indicate reduced fall risk  Baseline:  Goal status: INITIAL  5.  Pt will improve TUG to </=25 secs w/ LRAD at S level in order to indicate dec fall risk   Baseline: 29 secs with SPC and CGA with turn Goal status: INITIAL  6.  Pt will negotiate up/down 4 steps with single rail/cane, up/down ramp and curb and ambulate x 500' outdoors over unlevel paved surfaces at S level (with rollator outdoors for energy conservation) in order to indicate improved community mobility.   Baseline:  Goal status: INITIAL  LONG TERM GOALS: Target date: 05/07/22  Pt will be IND with final HEP in order to indicate improved functional mobility and dec fall risk. Baseline:  Goal status: INITIAL  2.  Pt will improve gait speed to >/=2.62 ft/sec w/ LRAD in order to indicate dec fall risk.  Baseline:  Goal status: INITIAL  3.  Pt will improve BERG balance test score by 8 points from baseline in order to indicate dec fall risk . Baseline:  Goal status: INITIAL  4.  Pt will improve 5TSS to </=13 secs without UE support only in order to indicate dec fall risk and improved functional strength.   Baseline:  Goal status: INITIAL  5.  Pt will improve TUG to </=20 secs w/ LRAD at S level in order to indicate dec fall risk   Baseline:  Goal status: INITIAL  6.  Pt will negotiate up/down 4 steps with single rail/cane, up/down ramp and curb and ambulate x 500' outdoors over unlevel paved surfaces at [employer] I (with rollator from cardiac standpoint) level in order to indicate improved community mobility.   Baseline:  Goal status: INITIAL  ASSESSMENT:  CLINICAL IMPRESSION: Session limited as pt arrived late. Emphasis of skilled PT session on pt education and vestibular assessment. Pt's glucose monitor beeping throughout session due to elevated sugar  levels (214 mm/dL) and did not take his medications this morning. Pt assessed for orthostatics but continued to speak while obtaining BP measures so unsure how reliable measures are. Pt did demonstrate positive head impulse test on L side, indicative of L hypofunction. Strongly encouraged pt to closely monitor blood glucose levels and take medications regularly prior to coming to PT to avoid adverse reactions. Pt and wife verbalized understanding. Continue POC.     OBJECTIVE IMPAIRMENTS: Abnormal gait, cardiopulmonary status limiting activity, decreased activity tolerance, decreased balance, decreased endurance, decreased knowledge of use of DME, decreased mobility, difficulty walking, decreased strength, dizziness, impaired perceived functional ability, impaired flexibility, and postural dysfunction.   ACTIVITY LIMITATIONS: lifting, bending, standing, squatting, stairs, transfers, and locomotion level  PARTICIPATION LIMITATIONS: driving, community activity, and yard work  PERSONAL FACTORS: Age and 3+ comorbidities: see above  are also affecting patient's functional outcome.   REHAB POTENTIAL: Good  CLINICAL DECISION MAKING: Evolving/moderate complexity  EVALUATION COMPLEXITY: Moderate  PLAN:  PT FREQUENCY: 2x/week  PT DURATION: 8 weeks  PLANNED INTERVENTIONS: Therapeutic exercises, Therapeutic activity, Neuromuscular re-education, Balance training, Gait training, Patient/Family education, Self Care, Stair training, Vestibular training, Canalith repositioning, DME instructions, and Aquatic Therapy  PLAN FOR NEXT SESSION: BERG, initiate HEP for BLE strength (sit<>stand, hip abd/ext) also incorporate balance with emphasis on head/body turns.  Add those to HEP as well. Keep an eye on vitals with prolonged exercise, work on endurance, gait outdoors with rollator (he wants to get back to walking outdoors on incline with rollator); habituation exercises for L side   Charlett Nose, PT,  DPT [facility] [address] [place], [place]  [address] Phone:  [phone] Fax:  [phone] 03/13/22, 3:35 PM

## 2022-03-15 ENCOUNTER — Ambulatory Visit: Payer: Medicare Other | Admitting: Physical Therapy

## 2022-03-15 VITALS — BP 140/60 | HR 61

## 2022-03-15 NOTE — Therapy (Signed)
OUTPATIENT PHYSICAL THERAPY NEURO TREATMENT- ARRIVED NO CHARGE   Patient Name: [name] MRN: [id number] DOB:12/02/1933, 89 y.o., male Today's Date: 03/15/2022   PCP: Deon Pilling, NP  REFERRING PROVIDER: Frann Rider, MD   END OF SESSION:  PT End of Session - 03/15/22 1425     Visit Number 2   Arrived no charge   Number of Visits 17    Date for PT Re-Evaluation 05/07/22    Authorization Type UHC Medicare (PN needed)    Progress Note Due on Visit 10    PT Start Time 1402    PT Stop Time 1418   Arrived no charge   PT Time Calculation (min) 16 min    Activity Tolerance Treatment limited secondary to medical complications (Comment)   Elevated blood sugar and poor medical management   Behavior During Therapy [facility] for tasks assessed/performed              Past Medical History:  Diagnosis Date   Aortic stenosis 02/27/2020   mild to moderate AS   Arthritis    "a little in LLE" (06/20/2017)   Ataxia    Chronic diastolic CHF (congestive heart failure) (HCC)    CKD (chronic kidney disease), stage IV ([place])    Coronary artery disease    a. s/p CABG 1980s. b. stable by cath 05/2017.   Dizziness    Dyslipidemia    Edema 08/08/2017   HANDS & LOWER EXTREMITES   Hypertension    Hypoalbuminemia    Nephrotic range proteinuria    Peripheral artery disease (HCC)    mild   Pneumonia ~ 1950 X 1   Prostate cancer ([place]) ~ 2015   Type II diabetes mellitus ([place])    Past Surgical History:  Procedure Laterality Date   CARDIAC CATHETERIZATION     "a couple times" (06/20/2017)   CATARACT EXTRACTION W/ INTRAOCULAR LENS  IMPLANT, BILATERAL Bilateral    COLONOSCOPY     CORONARY ARTERY BYPASS GRAFT  03/06/1985   LAD and first diagonal/circumflex (sequential saphenous vein graft,cardioplegia   INGUINAL HERNIA REPAIR Right 12/06/2020   Procedure: OPEN RIGHT INGUINAL HERNIA REPAIR;  Surgeon: Johnathan Hausen, MD;  [facility];  Service: General;  Laterality: Right;   INSERTION  PROSTATE RADIATION SEED  ~ 2015   NM MYOCAR PERF WALL MOTION  09/12/2006   no ischemia   RIGHT/LEFT HEART CATH AND CORONARY/GRAFT ANGIOGRAPHY N/A 06/21/2017   Procedure: RIGHT/LEFT HEART CATH AND CORONARY/GRAFT ANGIOGRAPHY;  Surgeon: Burnell Blanks, MD;  Location: [facility];  Service: Cardiovascular;  Laterality: N/A;   US ECHOCARDIOGRAPHY  11/04/2008   trace TR & MR   Patient Active Problem List   Diagnosis Date Noted   Hypokalemia    ESRD needing dialysis ([place])    Acute coronary syndrome ([place]) 04/18/2019   Hypoxia 08/15/2017   Nephrotic range proteinuria    Difficulty urinating    Acute on chronic diastolic (congestive) heart failure (HCC) 08/08/2017   SOB (shortness of breath) on exertion 06/20/2017   Other fatigue 06/19/2017   SOB (shortness of breath) 06/19/2017   Congestive heart failure ([place]) 02/28/2017   Peripheral artery disease (HCC)    Hypertension    DM (diabetes mellitus) ([place])    Coronary artery disease    Aortic valve stenosis 10/16/2016   Uncontrolled hypersomnia 10/07/2015   Acute kidney injury superimposed on chronic kidney disease ([place]) 09/18/2014   Murmur 09/12/2013   Dizziness 08/22/2012   CAD (coronary artery disease) of artery  bypass graft 08/22/2012   Hx of CABG 08/22/2012   Essential hypertension 08/22/2012   Dyslipidemia 08/22/2012   PAD (peripheral artery disease) ([place]) 08/22/2012   DM2 (diabetes mellitus, type 2) ([place]) 08/22/2012    ONSET DATE: 02/28/2022  REFERRING DIAG: R26.89 (ICD-10-CM) - Imbalance R42 (ICD-10-CM) - Dizziness, nonspecific G62.9 (ICD-10-CM) - Neuropathy  THERAPY DIAG:  Unsteadiness on feet  Rationale for Evaluation and Treatment: Rehabilitation  SUBJECTIVE:                                                                                                                                                                                             SUBJECTIVE STATEMENT: Pt reports he did not take his medicine  today. Blood glucose currently reading at 297 mg/dL.    Pt accompanied by: significant other [name])  PERTINENT HISTORY: CHF, HTN, ESRD on dialysis (Does at home every night), CAD, diabetes, carotid stenosis    PAIN:  Are you having pain? No  VITALS Vitals:   03/15/22 1414  BP: (!) 140/60  Pulse: 61      PRECAUTIONS: Fall  WEIGHT BEARING RESTRICTIONS: No  FALLS: Has patient fallen in last 6 months? No Reports many near falls, but stays near furniture"  LIVING ENVIRONMENT: Lives with: lives with their spouse Lives in: House/apartment Stairs: Yes: External: 8 steps; on right going up Has following equipment at home: Single point cane, shower chair, and toilet riser, grab bar in shower (tub/shower) , uses rollator when walking for exercise in house.   PLOF: Independent with basic ADLs, Independent with household mobility with device, and Independent with community mobility with device  PATIENT GOALS: "Get back to where I was."   OBJECTIVE:   COGNITION: Overall cognitive status: History of cognitive impairments - at baseline   SENSATION: Light touch: Impaired  Neuropathy in toes only    POSTURE: rounded shoulders, forward head, increased thoracic kyphosis, and posterior pelvic tilt   LOWER EXTREMITY MMT:    MMT Right Eval Left Eval  Hip flexion 3+/5 3+/5  Hip extension    Hip abduction 4/5 4/5  Hip adduction 4/5 4/5  Hip internal rotation    Hip external rotation    Knee flexion 3+/5 4/5  Knee extension 4/5 4/5  Ankle dorsiflexion 3+/5 3+/5  Ankle plantarflexion    Ankle inversion    Ankle eversion    (Blank rows = not tested)  TODAY'S TREATMENT:    Ther Act  Reiterated importance of managing blood sugar at home and paying attention to glucose monitor, which was beeping when pt arrived to clinic. Pt reported he did not take his meds this  morning and did not take any insulin today. Educated pt and wife on proper parameters for blood glucose and strongly  encouraged them to implement a consistent routine for medication to ensure pt's BP and blood glucose is staying WNL. Pt's wife verbalized understanding.     GAIT: Gait pattern: step to pattern, step through pattern, decreased stride length, decreased hip/knee flexion- Right, decreased hip/knee flexion- Left, decreased ankle dorsiflexion- Right, decreased ankle dorsiflexion- Left, shuffling, trunk flexed, poor foot clearance- Right, and poor foot clearance- Left Distance walked: Various clinic distances  Assistive device utilized: Single point cane Level of assistance: SBA and CGA Comments: Pt does well in straight forward path, however when needing to turn, he does get off balance needing CGA at times to steady.      PATIENT EDUCATION: Education details: See above Person educated: Patient and Spouse Education method: Explanation, Demonstration, and Verbal cues Education comprehension: verbalized understanding and needs further education  HOME EXERCISE PROGRAM: None initiated today   GOALS: Goals reviewed with patient? Yes  SHORT TERM GOALS: Target date: 04/08/22  Pt will be IND with initial HEP in order to indicate improved functional mobility and dec fall risk. Baseline: Goal status: INITIAL  2.  Pt will improve gait speed to >/=1.8 ft/sec w/ SPC in order to indicate dec fall risk.  Baseline: 1.35 ft/sec with cane  Goal status: INITIAL  3.  Pt will improve 5TSS to </=16 secs without UE support only in order to indicate dec fall risk and improved functional strength.   Baseline: 19.57 secs without UE support but with momentum and slight use of mat behind him.  Goal status: INITIAL  4.  Will assess BERG balance test and improve score by 4 points from baseline in order to indicate reduced fall risk  Baseline:  Goal status: INITIAL  5.  Pt will improve TUG to </=25 secs w/ LRAD at S level in order to indicate dec fall risk   Baseline: 29 secs with SPC and CGA with  turn Goal status: INITIAL  6.  Pt will negotiate up/down 4 steps with single rail/cane, up/down ramp and curb and ambulate x 500' outdoors over unlevel paved surfaces at S level (with rollator outdoors for energy conservation) in order to indicate improved community mobility.   Baseline:  Goal status: INITIAL  LONG TERM GOALS: Target date: 05/07/22  Pt will be IND with final HEP in order to indicate improved functional mobility and dec fall risk. Baseline:  Goal status: INITIAL  2.  Pt will improve gait speed to >/=2.62 ft/sec w/ LRAD in order to indicate dec fall risk.  Baseline:  Goal status: INITIAL  3.  Pt will improve BERG balance test score by 8 points from baseline in order to indicate dec fall risk . Baseline:  Goal status: INITIAL  4.  Pt will improve 5TSS to </=13 secs without UE support only in order to indicate dec fall risk and improved functional strength.   Baseline:  Goal status: INITIAL  5.  Pt will improve TUG to </=20 secs w/ LRAD at S level in order to indicate dec fall risk   Baseline:  Goal status: INITIAL  6.  Pt will negotiate up/down 4 steps with single rail/cane, up/down ramp and curb and ambulate x 500' outdoors over unlevel paved surfaces at [employer] I (with rollator from cardiac standpoint) level in order to indicate improved community mobility.   Baseline:  Goal status: INITIAL  ASSESSMENT:  CLINICAL IMPRESSION: Arrived no charge due  to hyperglycemia.     OBJECTIVE IMPAIRMENTS: Abnormal gait, cardiopulmonary status limiting activity, decreased activity tolerance, decreased balance, decreased endurance, decreased knowledge of use of DME, decreased mobility, difficulty walking, decreased strength, dizziness, impaired perceived functional ability, impaired flexibility, and postural dysfunction.   ACTIVITY LIMITATIONS: lifting, bending, standing, squatting, stairs, transfers, and locomotion level  PARTICIPATION LIMITATIONS: driving, community  activity, and yard work  PERSONAL FACTORS: Age and 3+ comorbidities: see above  are also affecting patient's functional outcome.   REHAB POTENTIAL: Good  CLINICAL DECISION MAKING: Evolving/moderate complexity  EVALUATION COMPLEXITY: Moderate  PLAN:  PT FREQUENCY: 2x/week  PT DURATION: 8 weeks  PLANNED INTERVENTIONS: Therapeutic exercises, Therapeutic activity, Neuromuscular re-education, Balance training, Gait training, Patient/Family education, Self Care, Stair training, Vestibular training, Canalith repositioning, DME instructions, and Aquatic Therapy  PLAN FOR NEXT SESSION: BERG, initiate HEP for BLE strength (sit<>stand, hip abd/ext) also incorporate balance with emphasis on head/body turns.  Add those to HEP as well. Keep an eye on vitals with prolonged exercise, work on endurance, gait outdoors with rollator (he wants to get back to walking outdoors on incline with rollator); habituation exercises for L side   Charlett Nose, PT, DPT [facility] [address] [place], [place]  [address] Phone:  [phone] Fax:  [phone] 03/15/22, 2:26 PM

## 2022-03-20 ENCOUNTER — Ambulatory Visit: Payer: Medicare Other | Admitting: Physical Therapy

## 2022-03-20 DIAGNOSIS — R2689 Other abnormalities of gait and mobility: Secondary | ICD-10-CM

## 2022-03-20 DIAGNOSIS — R2681 Unsteadiness on feet: Secondary | ICD-10-CM

## 2022-03-20 DIAGNOSIS — M6281 Muscle weakness (generalized): Secondary | ICD-10-CM

## 2022-03-20 NOTE — Therapy (Signed)
OUTPATIENT PHYSICAL THERAPY NEURO TREATMENT   Patient Name: [name] MRN: [id number] DOB:1933-07-19, 89 y.o., male Today's Date: 03/20/2022   PCP: Deon Pilling, NP  REFERRING PROVIDER: Frann Rider, MD   END OF SESSION:  PT End of Session - 03/20/22 1418     Visit Number 3    Number of Visits 17    Date for PT Re-Evaluation 05/07/22    Authorization Type [employer] Medicare (PN needed)    Progress Note Due on Visit 10    PT Start Time 1415   Therapist not aware pt had arrived   PT Stop Time 1502    PT Time Calculation (min) 47 min    Activity Tolerance Patient tolerated treatment well;Treatment limited secondary to medical complications (Comment)   Hyperglycemia   Behavior During Therapy [facility] for tasks assessed/performed              Past Medical History:  Diagnosis Date   Aortic stenosis 02/27/2020   mild to moderate AS   Arthritis    "a little in LLE" (06/20/2017)   Ataxia    Chronic diastolic CHF (congestive heart failure) (HCC)    CKD (chronic kidney disease), stage IV ([place])    Coronary artery disease    a. s/p CABG 1980s. b. stable by cath 05/2017.   Dizziness    Dyslipidemia    Edema 08/08/2017   HANDS & LOWER EXTREMITES   Hypertension    Hypoalbuminemia    Nephrotic range proteinuria    Peripheral artery disease (HCC)    mild   Pneumonia ~ 1950 X 1   Prostate cancer ([place]) ~ 2015   Type II diabetes mellitus ([place])    Past Surgical History:  Procedure Laterality Date   CARDIAC CATHETERIZATION     "a couple times" (06/20/2017)   CATARACT EXTRACTION W/ INTRAOCULAR LENS  IMPLANT, BILATERAL Bilateral    COLONOSCOPY     CORONARY ARTERY BYPASS GRAFT  03/06/1985   LAD and first diagonal/circumflex (sequential saphenous vein graft,cardioplegia   INGUINAL HERNIA REPAIR Right 12/06/2020   Procedure: OPEN RIGHT INGUINAL HERNIA REPAIR;  Surgeon: Johnathan Hausen, MD;  Location: [facility];  Service: General;  Laterality: Right;   INSERTION PROSTATE RADIATION SEED  ~  2015   NM MYOCAR PERF WALL MOTION  09/12/2006   no ischemia   RIGHT/LEFT HEART CATH AND CORONARY/GRAFT ANGIOGRAPHY N/A 06/21/2017   Procedure: RIGHT/LEFT HEART CATH AND CORONARY/GRAFT ANGIOGRAPHY;  Surgeon: Burnell Blanks, MD;  Location: [place] [facility];  Service: Cardiovascular;  Laterality: N/A;   US ECHOCARDIOGRAPHY  11/04/2008   trace TR & MR   Patient Active Problem List   Diagnosis Date Noted   Hypokalemia    ESRD needing dialysis ([place])    Acute coronary syndrome ([place]) 04/18/2019   Hypoxia 08/15/2017   Nephrotic range proteinuria    Difficulty urinating    Acute on chronic diastolic (congestive) heart failure (HCC) 08/08/2017   SOB (shortness of breath) on exertion 06/20/2017   Other fatigue 06/19/2017   SOB (shortness of breath) 06/19/2017   Congestive heart failure ([place]) 02/28/2017   Peripheral artery disease (HCC)    Hypertension    DM (diabetes mellitus) ([place])    Coronary artery disease    Aortic valve stenosis 10/16/2016   Uncontrolled hypersomnia 10/07/2015   Acute kidney injury superimposed on chronic kidney disease ([place]) 09/18/2014   Murmur 09/12/2013   Dizziness 08/22/2012   CAD (coronary artery disease) of artery bypass graft 08/22/2012   Hx of  CABG 08/22/2012   Essential hypertension 08/22/2012   Dyslipidemia 08/22/2012   PAD (peripheral artery disease) ([place]) 08/22/2012   DM2 (diabetes mellitus, type 2) ([place]) 08/22/2012    ONSET DATE: 02/28/2022  REFERRING DIAG: R26.89 (ICD-10-CM) - Imbalance R42 (ICD-10-CM) - Dizziness, nonspecific G62.9 (ICD-10-CM) - Neuropathy  THERAPY DIAG:  Unsteadiness on feet  Muscle weakness (generalized)  Other abnormalities of gait and mobility  Rationale for Evaluation and Treatment: Rehabilitation  SUBJECTIVE:                                                                                                                                                                                             SUBJECTIVE  STATEMENT: Pt reports he did take insulin this morning, glucose currently reading at 192 mg/dL. He did take his meds this morning.    Pt accompanied by: significant other [name])  PERTINENT HISTORY: CHF, HTN, ESRD on dialysis (Does at home every night), CAD, diabetes, carotid stenosis    PAIN:  Are you having pain? No  VITALS There were no vitals filed for this visit.   PRECAUTIONS: Fall  WEIGHT BEARING RESTRICTIONS: No  FALLS: Has patient fallen in last 6 months? No Reports many near falls, but stays near furniture"  LIVING ENVIRONMENT: Lives with: lives with their spouse Lives in: House/apartment Stairs: Yes: External: 8 steps; on right going up Has following equipment at home: Single point cane, shower chair, and toilet riser, grab bar in shower (tub/shower) , uses rollator when walking for exercise in house.   PLOF: Independent with basic ADLs, Independent with household mobility with device, and Independent with community mobility with device  PATIENT GOALS: "Get back to where I was."   OBJECTIVE:   COGNITION: Overall cognitive status: History of cognitive impairments - at baseline   SENSATION: Light touch: Impaired  Neuropathy in toes only    POSTURE: rounded shoulders, forward head, increased thoracic kyphosis, and posterior pelvic tilt   LOWER EXTREMITY MMT:    MMT Right Eval Left Eval  Hip flexion 3+/5 3+/5  Hip extension    Hip abduction 4/5 4/5  Hip adduction 4/5 4/5  Hip internal rotation    Hip external rotation    Knee flexion 3+/5 4/5  Knee extension 4/5 4/5  Ankle dorsiflexion 3+/5 3+/5  Ankle plantarflexion    Ankle inversion    Ankle eversion    (Blank rows = not tested)  TODAY'S TREATMENT:    Self-care/home management Reiterated importance of managing blood sugar at home and paying attention to glucose monitor, which was beeping when pt arrived to clinic. Pt reported he took insulin this morning with  breakfast but has not taken any  since. Glucose was reading 202 mg/dL at noon (per wife). Pt's wife reports they received instructions on when to administer insulin but she did not bring the instructions with her today. Strongly encouraged pt to call Deon Pilling and inquire about blood glucose management and when to take insulin as this is outside of therapist's scope. Pt and wife verbalized understanding.   Ther Ex  SciFit multi-peaks level 5 for 8 minutes using BUE/BLEs for neural priming for reciprocal movement, dynamic cardiovascular warmup and global strength. RPE of 10/10 following activity and pt reported mild dizziness when coming to stand from [place]. Glucose at 185 mg/dL and vitals read as follows: SpO2 99% on room air, BP 130/57 mmHg, HR 64 bpm following activity.     GAIT: Gait pattern: step to pattern, step through pattern, decreased stride length, decreased hip/knee flexion- Right, decreased hip/knee flexion- Left, decreased ankle dorsiflexion- Right, decreased ankle dorsiflexion- Left, shuffling, trunk flexed, poor foot clearance- Right, and poor foot clearance- Left Distance walked: Various clinic distances  Assistive device utilized: Single point cane Level of assistance: SBA and CGA Comments: Pt does well in straight forward path, however when needing to turn, he does get off balance needing CGA at times to steady.   PATIENT EDUCATION: Education details: See self-care section  Person educated: Patient and Spouse Education method: Explanation, Demonstration, and Verbal cues Education comprehension: verbalized understanding and needs further education  HOME EXERCISE PROGRAM: None initiated today   GOALS: Goals reviewed with patient? Yes  SHORT TERM GOALS: Target date: 04/08/22  Pt will be IND with initial HEP in order to indicate improved functional mobility and dec fall risk. Baseline: Goal status: INITIAL  2.  Pt will improve gait speed to >/=1.8 ft/sec w/ SPC in order to indicate dec fall  risk.  Baseline: 1.35 ft/sec with cane  Goal status: INITIAL  3.  Pt will improve 5TSS to </=16 secs without UE support only in order to indicate dec fall risk and improved functional strength.   Baseline: 19.57 secs without UE support but with momentum and slight use of mat behind him.  Goal status: INITIAL  4.  Will assess BERG balance test and improve score by 4 points from baseline in order to indicate reduced fall risk  Baseline:  Goal status: INITIAL  5.  Pt will improve TUG to </=25 secs w/ LRAD at S level in order to indicate dec fall risk   Baseline: 29 secs with SPC and CGA with turn Goal status: INITIAL  6.  Pt will negotiate up/down 4 steps with single rail/cane, up/down ramp and curb and ambulate x 500' outdoors over unlevel paved surfaces at S level (with rollator outdoors for energy conservation) in order to indicate improved community mobility.   Baseline:  Goal status: INITIAL  LONG TERM GOALS: Target date: 05/07/22  Pt will be IND with final HEP in order to indicate improved functional mobility and dec fall risk. Baseline:  Goal status: INITIAL  2.  Pt will improve gait speed to >/=2.62 ft/sec w/ LRAD in order to indicate dec fall risk.  Baseline:  Goal status: INITIAL  3.  Pt will improve BERG balance test score by 8 points from baseline in order to indicate dec fall risk . Baseline:  Goal status: INITIAL  4.  Pt will improve 5TSS to </=13 secs without UE support only in order to indicate dec fall risk and improved functional strength.   Baseline:  Goal  status: INITIAL  5.  Pt will improve TUG to </=20 secs w/ LRAD at S level in order to indicate dec fall risk   Baseline:  Goal status: INITIAL  6.  Pt will negotiate up/down 4 steps with single rail/cane, up/down ramp and curb and ambulate x 500' outdoors over unlevel paved surfaces at [employer] I (with rollator from cardiac standpoint) level in order to indicate improved community mobility.   Baseline:   Goal status: INITIAL  ASSESSMENT:  CLINICAL IMPRESSION: Session limited due to pt's hyperglycemia. Continued to provide heavy education on the importance of proper management of blood glucose for overall health. Encouraged pt to reach out to PCP regarding questions on taking insulin, as pt has not been taking it regularly. Pt performed well on SciFit but did report dizziness during sit <>stand transfer off bike, all vitals WNL except blood glucose, which did not significantly decline w/exercise. Continue POC.     OBJECTIVE IMPAIRMENTS: Abnormal gait, cardiopulmonary status limiting activity, decreased activity tolerance, decreased balance, decreased endurance, decreased knowledge of use of DME, decreased mobility, difficulty walking, decreased strength, dizziness, impaired perceived functional ability, impaired flexibility, and postural dysfunction.   ACTIVITY LIMITATIONS: lifting, bending, standing, squatting, stairs, transfers, and locomotion level  PARTICIPATION LIMITATIONS: driving, community activity, and yard work  PERSONAL FACTORS: Age and 3+ comorbidities: see above  are also affecting patient's functional outcome.   REHAB POTENTIAL: Good  CLINICAL DECISION MAKING: Evolving/moderate complexity  EVALUATION COMPLEXITY: Moderate  PLAN:  PT FREQUENCY: 2x/week  PT DURATION: 8 weeks  PLANNED INTERVENTIONS: Therapeutic exercises, Therapeutic activity, Neuromuscular re-education, Balance training, Gait training, Patient/Family education, Self Care, Stair training, Vestibular training, Canalith repositioning, DME instructions, and Aquatic Therapy  PLAN FOR NEXT SESSION: did they call Deon Pilling? CHECK GLUCOSE AND BP  BERG, initiate HEP for BLE strength (sit<>stand, hip abd/ext) also incorporate balance with emphasis on head/body turns.  Add those to HEP as well. Keep an eye on vitals with prolonged exercise, work on endurance, gait outdoors with rollator (he wants to get back to  walking outdoors on incline with rollator); habituation exercises for L side   Charlett Nose, PT, DPT [facility] [address] [place], [place]  [address] Phone:  [phone] Fax:  [phone] 03/20/22, 3:03 PM

## 2022-03-22 ENCOUNTER — Encounter: Payer: Self-pay | Admitting: Rehabilitation

## 2022-03-22 ENCOUNTER — Ambulatory Visit: Payer: Medicare Other | Admitting: Rehabilitation

## 2022-03-22 DIAGNOSIS — R2689 Other abnormalities of gait and mobility: Secondary | ICD-10-CM

## 2022-03-22 DIAGNOSIS — M6281 Muscle weakness (generalized): Secondary | ICD-10-CM

## 2022-03-22 DIAGNOSIS — R293 Abnormal posture: Secondary | ICD-10-CM

## 2022-03-22 DIAGNOSIS — R2681 Unsteadiness on feet: Secondary | ICD-10-CM

## 2022-03-22 NOTE — Therapy (Signed)
[place] [address] [place], [place], [address] Phone: [phone]   Fax:  [phone]  Patient Details  Name: [name] MRN: [id number] Date of Birth: 10-15-1933 Referring Provider:  Deon Pilling, NP  Encounter Date: 03/22/2022   Pt and wife arrive to visit.  His glucose monitor is already beeping while walking back to treatment area.  Was noted to be 180.  He reports he did take insulin earlier but it continues to rise.  He and wife report he went to doctor today however upon further questioning, he reports it was the kidney doctor and they did not discuss blood sugars at all.  They have made no efforts to reach out to primary care MD despite maximum education from treating therapist in last 3 sessions.  Again, provided extensive education on dangers of having prolonged elevated blood glucose, not to scare them but to show concern for his health, as he is already on dialysis for kidney failure.  Recommended they seek guidance from MD to better control diabetes and then return to PT.  Wife notably upset and would like to cancel appts until they see MD.  PT agree with plan and educated that referral is good for 6 months.  Wife and pt verbalized understanding.    Cameron Sprang, PT, MPT [facility] [address] [place], [place], [address] Phone: [phone]   Fax:  [phone] 03/22/22, 2:20 PM

## 2022-03-27 ENCOUNTER — Ambulatory Visit: Payer: Medicare Other | Admitting: Physical Therapy

## 2022-03-29 ENCOUNTER — Ambulatory Visit: Payer: Medicare Other | Admitting: Rehabilitation

## 2022-04-03 ENCOUNTER — Ambulatory Visit: Payer: Medicare Other | Admitting: Physical Therapy

## 2022-04-05 ENCOUNTER — Ambulatory Visit: Payer: Medicare Other | Admitting: Physical Therapy

## 2022-04-06 ENCOUNTER — Other Ambulatory Visit: Payer: Self-pay | Admitting: Internal Medicine

## 2022-04-10 ENCOUNTER — Ambulatory Visit: Payer: Medicare Other | Admitting: Physical Therapy

## 2022-04-12 ENCOUNTER — Ambulatory Visit: Payer: Medicare Other | Admitting: Rehabilitation

## 2022-04-17 ENCOUNTER — Ambulatory Visit: Payer: Medicare Other | Admitting: Physical Therapy

## 2022-04-19 ENCOUNTER — Ambulatory Visit: Payer: Medicare Other | Admitting: Rehabilitation

## 2022-04-21 ENCOUNTER — Ambulatory Visit: Payer: Medicare Other | Attending: Internal Medicine | Admitting: Internal Medicine

## 2022-04-21 ENCOUNTER — Encounter: Payer: Self-pay | Admitting: Internal Medicine

## 2022-04-21 VITALS — BP 122/70 | HR 59 | Ht 71.0 in | Wt 179.0 lb

## 2022-04-21 DIAGNOSIS — I5032 Chronic diastolic (congestive) heart failure: Secondary | ICD-10-CM

## 2022-04-21 DIAGNOSIS — N186 End stage renal disease: Secondary | ICD-10-CM | POA: Diagnosis not present

## 2022-04-21 DIAGNOSIS — Z951 Presence of aortocoronary bypass graft: Secondary | ICD-10-CM | POA: Diagnosis not present

## 2022-04-21 DIAGNOSIS — I10 Essential (primary) hypertension: Secondary | ICD-10-CM

## 2022-04-21 DIAGNOSIS — Z992 Dependence on renal dialysis: Secondary | ICD-10-CM

## 2022-04-21 DIAGNOSIS — I350 Nonrheumatic aortic (valve) stenosis: Secondary | ICD-10-CM | POA: Diagnosis not present

## 2022-04-21 NOTE — Progress Notes (Signed)
OFFICE NOTE   Chief Complaint:  Follow-up   Primary Care Physician: Nathaniel Pilling, NP  HPI:  [name] is a 89 year old with a history of coronary artery disease status post CABG in 1987, hypertension, dyslipidemia, diabetes, and peripheral arterial disease which is mild. Over the past year he has done well from a cardiac standpoint. He does have a history of prostate cancer and underwent radiation seed implant for that. He has also been having problems with confusion and brain fog and his medications were adjusted, including holding his statin, which did not cause much improvement. This may be some early dementia. In addition, he has abnormal lower extremity Dopplers, which I repeated. This has actually shown mild improvement in his ABIs with ABI 1.1 on the right and ABI of 0.92 on the left. The bilateral posterior tibials were occluded and there was 50% to 69% reduction in left common iliac and about a 0 to 49% reduction in the right superficial femoral. Despite this, he has no symptoms of claudication. He also denies any chest pain. He has had some shortness of breath and this, however, seems to be related to this episode over the past several months where he reported initially he was thought stung by a bee, developed swelling on the left side of his face and then subsequently developed extreme weakness and there as concern of a rheumatoid arthritis. His symptoms have improved with a steroid burst and tapered. He is currently on prednisone as well as methotrexate. He has also had recent dizziness. He reports head and sinus congestion, which may be contributing to his symptoms. However some of the dizziness is associated with walking up a hill at the end of exercise or walking down hills particularly. Is not associated with change in position or quick head movements.  There is some associated fatigue and chest fullness with exercise and improves with rest, reminiscent of possible  angina.  He underwent bilateral carotid Dopplers which showed a mild amount of plaque. A nuclear stress test performed which demonstrated no reversible ischemia and preserved ejection fraction. He still reports he has some mild dizziness and does fatigue a little at the beginning of exercise but improves when he continues to exercise.  He is generally without complaints today. He reports that recently blood work indicated that his kidney function was worsening and that he is now scheduled to see a nephrologist next Monday. I do not yet have those results in front of me. His metformin was discontinued due to this but he remains on lisinopril and that may need to be adjusted. He denies any specific anginal symptoms. He has some very mild claudication symptoms but it improves with walking. He continues to have a systolic murmur best heard over the aortic area and does have a history of aortic sclerosis in the past.  [name] returns today for follow-up. Unfortunately says he hasn't taken his medicine today. He is supposed to take hydralazine 100 mg 3 times a day and for some reason has not taken 2 doses this morning. Blood pressure was very high at 196/86 but he denied any chest pain or headache. He says he going to take his medicine when he gets home. Is also on Flomax but no other blood pressure medicines. This is probably due to worsening renal function. Currently his creatinine is around 3 and is followed by Dr. Mercy Tate. Is also on Lipitor and cholesterol was recently checked by Dr. Wilson Tate.  10/07/2015  [name] returns today for follow-up. Over the past year he's had no new complaints although his blood pressures been difficult to control. He's followed by Dr. Mercy Tate. He was previously on amlodipine but that's been discontinued. He is now on hydralazine, clonidine and valsartan. He did not take his medicines today and his blood pressure is elevated. He says he takes the clonidine once or twice  per day. He tries to void in the morning because it causes extreme fatigue. We had previously discussed the Catapres patch however cost was limiting. He also reports recently some worsening memory.  10/16/2016  [name] returns for follow-up. Many years since I last saw him. He's called in for some chest pain which she said got better. His last stress test was in 2016 which was negative for ischemia. He was noted to have some aortic sclerosis in 2015 however last year was noted to have a louder systolic murmur which sounds more like aortic stenosis. Blood pressures not been well controlled. Today's elevated again 160/70. He says his blood pressure medicines make him feel tired. He told me that he takes all for his blood pressure medicines at night. When asked further about his hydralazine which is supposed to be taken 3 times a day he says he does not do that. He then reported that his blood pressure typically runs higher in the morning and throughout the day. He currently denies any chest pain. He reports some left lower strandy swelling but also has numbness and tingling has been undergoing and back injections.  10/27/2016  [name] had adjustments on his medications. He is now taking a hydralazine 3 times a day, irbesartan in the morning and clonidine at night. I reviewed his blood pressures which tend to range in the 140s in the morning before taking medication, in the 120s in the afternoon after medication and typically in the 120s over 80s at night. This indicates adequate treatment of his blood pressure. He did undergo an echocardiogram for aortic stenosis. This demonstrated a normal LVEF of 60-65% with mild aortic stenosis and a mean gradient of 11 mmHg. He understands the implications of this and that we will continue to follow his aortic valve disease.  02/28/2017  [name] presents today with significant shortness of breath, weight gain and leg edema as well as increasing abdominal  girth over the past 2-3 weeks.  The symptoms came on gradually but clinically worsened.  Weight in August was 185 pounds, he now weighs 204 pounds.  Reports worsening shortness of breath, orthopnea and PND.  Blood pressure was also elevated today 183/75 and recently has been difficult to control.  His primary care provider recommended increasing his clonidine to 0.1 mg 3 times daily, however he has had significant daytime fatigue with this.  03/16/2017  [name] returns today for follow-up of heart failure.  His weight back in August was 185 pounds and he gained up to 204 pounds.  When I last saw him I doubled his diuretic and increased his blood pressure medication.  Over the past 2 weeks he has diuresed back down to 182 pounds.  He feels markedly better.  Blood pressure is better controlled today 132/58.  He feels like his energy level has improved.  We obtain blood work when he was in congestive heart failure which showed an elevated creatinine over 2 and a baseline around 1.6.  He was hypokalemic and I increased his potassium.  His echo showed an EF of 65-70% with grade 2  diastolic dysfunction and high LV filling pressures.  06/19/2017  [name] was seen today in follow-up.  I last saw him in January, at which time he was complaining of shortness of breath and edema.  Weight was up to 204 pounds.  He was given diuretics and his weight has come down significantly.  In fact, his creatinine rose up to 2.7 and weight was down to 182.  He saw his nephrologist, Dr. Hollie Salk, recently who decreased his Lasix further and started amlodipine 10 mg daily.  He now says over the past several months he has had progressive fatigue, decreased exercise tolerance and worsening shortness of breath.  He wonders if it is related to his aortic valve.  In January was noted to have normal systolic function and mild aortic stenosis.  On exam, he has what sounds like moderate left ear with an audible second heart sound.  He  again looks volume overloaded today and weight is back up to 193 pounds, up from 182 pounds.  He denies any chest pain.  01/02/2018  [name] returns today for follow-up of his heart failure.  Recently he has been seen by nephrology.  He felt that he is renal failure has been fairly stable.  Plans were to possibly resume ARB therapy.  His aortic stenosis is mild to moderate and followed by [place].  His EKG shows sinus rhythm at 65.  He denies any worsening shortness of breath.  Weight has been stable.  I had referred him for heart cath in April 2019 which showed two-vessel coronary disease with 3 out of 3 patent bypass grafts.  Medical therapy was recommended however it was felt to be volume overloaded.  Subsequently he was admitted with heart failure and diuresis.  His weight now is down significantly to 170 pounds from 182 most recently.  He seems to be feeling much better.  08/06/2019  [name] was again admitted for heart failure in February of this past year.  Ultimately he had progression of renal failure with hypokalemia and congestive heart failure and transition to peritoneal dialysis.  Since then he is done much better and is fairly stable.  Weight is come down to about 155 although measured 160 in our office today.  Blood pressure has been well controlled at home.  He denies any palpitations or chest pain.  He is noted to have a history of aortic stenosis which sounds mild to moderate on physical exam.  EKG personally reviewed today shows sinus rhythm.  01/09/2020  [name] returns today for follow-up.  More recently he has noted increasing fatigue and shortness of breath.  He had an echo earlier in the year which showed mild to moderate aortic stenosis however by exam it appeared to be more moderate to severe in my opinion.  We were going to schedule repeat echo earlier next year however I think he should have it sooner.  He denies any anginal symptoms.  Blood pressure is well controlled  today in fact at times he is log indicates his blood pressure may get a little too low.  This could be contributing to some symptoms of dizziness that he is reporting.  EKG today shows a sinus bradycardia with voltage criteria for LVH at 19.  07/14/2018  [name] is seen today in follow-up.  He reports his dizziness is improved somewhat.  I had decreased his hydralazine but also they made some changes in his dialysis.  He apparently does home hemodialysis through peritoneal catheter.  He is followed by Dr. Hollie Salk.  He had mild to moderate aortic stenosis with a mean gradient of 19 mmHg in December 2021.  He will need a repeat echo in about a year.  He underwent vascular lower extremity Dopplers in March after seeing Doreene Adas, PA-C and was then referred to Dr. Gwenlyn Found for PAD.  He was not felt to have critical limb ischemia or lifestyle limiting claudication and therefore medical therapy was recommended.  07/07/2021  [name] is seen today in follow-up.  He just saw Coletta Memos, NP about a month ago.  Overall seems like he was doing fairly well.  Heart failure seems stable.  He does have issues with ongoing dizziness which is his main complaint.  I do think this is multifactorial probably having to do with peripheral neuropathy/PAD.  He seems to be stable with regards to weight.  He did have a repeat echo which showed more moderate aortic stenosis.  Mean gradient about 24 mmHg.  On exam it sounds more moderate to severe with a faint second heart sound.  Is not clear if this is contributing to his dizziness but I suspect not.  He will need close follow-up though.  01/09/2022  [name] is seen today in follow-up.  He continues to have issues with dizziness.  He had a repeat echo to assess his aortic stenosis which is still moderate.  LV function is normal.  He had carotid Dopplers which showed no significant obstruction.  He has had issues with hypertension.  The blood pressure is quite high  today although he did not take his blood pressure meds.  Apparently there was a death of a family member last night and he had another family member die fairly recently.  There may be a lot more stress that he is under.  He said he did not eat at all today but he did have a Dexcom blood glucose meter on which was reading quite high, 271 in the office and 300 mg/dL this morning which was a fasting reading.  He is on long-acting insulin.  Blood sugars managed by his PCP.  He continues on home peritoneal dialysis.  04/21/2022  [name] is seen today in follow-up.  He is accompanied by his wife.  She notes that he has been breathing more heavily recently but not necessarily more short of breath and this seems to be at rest when sitting in a recliner.  He is on home PD but that does not give him the ability to ultrafiltrate.  He also takes twice daily diuretic.  He reports his weight has been fairly stable.  Blood pressure is well-controlled.  He denies any chest pain.  EKG today shows some sinus bradycardia with inferior ST and T wave changes.  He had a recent echo which shows stable moderate aortic valve stenosis in September 2023 with normal LVEF.  PMHx:  Past Medical History:  Diagnosis Date   Aortic stenosis 02/27/2020   mild to moderate AS   Arthritis    "a little in LLE" (06/20/2017)   Ataxia    Chronic diastolic CHF (congestive heart failure) (HCC)    CKD (chronic kidney disease), stage IV ([place])    Coronary artery disease    a. s/p CABG 1980s. b. stable by cath 05/2017.   Dizziness    Dyslipidemia    Edema 08/08/2017   HANDS & LOWER EXTREMITES   Hypertension    Hypoalbuminemia    Nephrotic range proteinuria  Peripheral artery disease (HCC)    mild   Pneumonia ~ 1950 X 1   Prostate cancer ([place]) ~ 2015   Type II diabetes mellitus ([place])     Past Surgical History:  Procedure Laterality Date   CARDIAC CATHETERIZATION     "a couple times" (06/20/2017)   CATARACT EXTRACTION W/  INTRAOCULAR LENS  IMPLANT, BILATERAL Bilateral    COLONOSCOPY     CORONARY ARTERY BYPASS GRAFT  03/06/1985   LAD and first diagonal/circumflex (sequential saphenous vein graft,cardioplegia   INGUINAL HERNIA REPAIR Right 12/06/2020   Procedure: OPEN RIGHT INGUINAL HERNIA REPAIR;  Surgeon: Johnathan Hausen, MD;  Location: [facility];  Service: General;  Laterality: Right;   INSERTION PROSTATE RADIATION SEED  ~ 2015   NM MYOCAR PERF WALL MOTION  09/12/2006   no ischemia   RIGHT/LEFT HEART CATH AND CORONARY/GRAFT ANGIOGRAPHY N/A 06/21/2017   Procedure: RIGHT/LEFT HEART CATH AND CORONARY/GRAFT ANGIOGRAPHY;  Surgeon: Burnell Blanks, MD;  Location: [facility];  Service: Cardiovascular;  Laterality: N/A;   US ECHOCARDIOGRAPHY  11/04/2008   trace TR & MR    FAMHx:  Family History  Problem Relation Age of Onset   Heart attack Mother     SOCHx:   reports that he quit smoking about 49 years ago. His smoking use included cigarettes. He has a 20.00 pack-year smoking history. He has never used smokeless tobacco. He reports that he does not currently use alcohol. He reports that he does not use drugs.  ALLERGIES:  Allergies  Allergen Reactions   Carvedilol     Other reaction(s): dizziness   Clonidine Hcl     Other reaction(s): constipation   Colace [Docusate]     ineffective    ROS: Pertinent items noted in HPI and remainder of comprehensive ROS otherwise negative.  HOME MEDS: Current Outpatient Medications  Medication Sig Dispense Refill   aspirin EC 81 MG tablet Take 81 mg by mouth daily.     atorvastatin (LIPITOR) 80 MG tablet Take 1 tablet (80 mg total) by mouth daily. 45 tablet 1   Azelastine HCl 0.15 % SOLN Place 1 spray into both nostrils daily as needed (allergies).     B Complex-C (B COMPLEX-VITAMIN C) CAPS Take by mouth.     B Complex-C-Zn-Folic Acid (DIALYVITE Q000111Q WITH ZINC) 0.8 MG TABS Take 1 tablet by mouth daily.     calcitRIOL (ROCALTROL) 0.25 MCG capsule Take  0.25 mcg by mouth 3 (three) times a week.     Carboxymethylcellulose Sod PF 0.5 % SOLN 1 drop into affected eye as needed     carvedilol (COREG) 3.125 MG tablet TAKE 1 TABLET BY MOUTH TWICE A DAY WITH FOOD 99991111 tablet 1   folic acid (FOLVITE) Q000111Q MCG tablet Take 800 mcg by mouth daily.     furosemide (LASIX) 80 MG tablet TAKE 1 TABLET (80 MG TOTAL) BY MOUTH 2 (TWO) TIMES DAILY AT 10 AM AND 5 PM. 180 tablet 3   gentamicin cream (GARAMYCIN) 0.1 % Apply topically daily.     glimepiride (AMARYL) 4 MG tablet Take 4 mg by mouth daily as needed (diabetes).     hydrALAZINE (APRESOLINE) 50 MG tablet Take 1 tablet (50 mg total) by mouth in the morning and at bedtime. Please keep scheduled appointment 90 tablet 3   HYDROcodone-acetaminophen (NORCO/VICODIN) 5-325 MG tablet Take 1 tablet by mouth every 6 (six) hours as needed for moderate pain. 15 tablet 0   insulin glargine (LANTUS) 100 UNIT/ML injection Inject  13 Units into the skin at bedtime.     isosorbide mononitrate (IMDUR) 30 MG 24 hr tablet 1 tablet in the morning     KLOR-CON M10 10 MEQ tablet TAKE 1 TABLET BY MOUTH EVERY DAY 90 tablet 3   Multiple Vitamin (MULTIVITAMIN) tablet Take 1 tablet by mouth daily.      nitroGLYCERIN (NITROSTAT) 0.4 MG SL tablet Place 1 tablet (0.4 mg total) under the tongue every 5 (five) minutes x 3 doses as needed for chest pain. 25 tablet 4   OVER THE COUNTER MEDICATION Place 1 spray into the nose daily as needed. Sovereign Silver Nasal Spray for Immune Support     Propylene Glycol (SYSTANE COMPLETE) 0.6 % SOLN Place 1 drop into both eyes daily as needed (Dry eye).     sevelamer carbonate (RENVELA) 800 MG tablet Take 800 mg by mouth 5 (five) times daily.     tolterodine (DETROL LA) 4 MG 24 hr capsule Take 4 mg by mouth daily.     TRADJENTA 5 MG TABS tablet Take 5 mg by mouth every morning.      traZODone (DESYREL) 50 MG tablet Take 50 mg by mouth at bedtime as needed for sleep.   12   amLODipine (NORVASC) 10 MG tablet  Take 10 mg by mouth at bedtime. (Patient not taking: Reported on 04/21/2022)     No current facility-administered medications for this visit.    LABS/IMAGING: No results found for this or any previous visit (from the past 48 hour(s)).  No results found.  VITALS: BP 122/70   Pulse (!) 59   Ht '5\' 11"'$  (1.803 m)   Wt 179 lb (81.2 kg)   BMI 24.97 kg/m   EXAM: General appearance: alert, no distress and moderately obese Neck: no carotid bruit, no JVD and thyroid not enlarged, symmetric, no tenderness/mass/nodules Lungs: clear to auscultation bilaterally Heart: regular rate and rhythm, S1, S2 normal and systolic murmur: systolic ejection 3/6, crescendo at 2nd right intercostal space Abdomen: soft, non-tender; bowel sounds normal; no masses,  no organomegaly Extremities: extremities normal, atraumatic, no cyanosis or edema Pulses: 2+ and symmetric Skin: Skin color, texture, turgor normal. No rashes or lesions Neurologic: Grossly normal Psych: Pleasant  EKG: Bradycardia at 59, inferior ST and T wave changes-personally reviewed  ASSESSMENT: 1.   Chronic diastolic congestive heart failure ESRD on PD Aortic stenosis Coronary artery disease status post CABG in 1987 -patent bypass grafts by cath (05/2017) Hypertension Hyperlipidemia Diabetes type 2 Mild PAD - intermittent claudication, not lifestyle limiting Possible rheumatoid arthritis  PLAN:  1.   [name] reportedly has had an abnormal respiratory pattern demonstrating some increasing work of breathing but not necessarily worsening shortness of breath.  His weights have been fairly stable.  He should monitor for some development of acute on chronic diastolic congestive heart failure and the need to potentially increase his diuretic.  He still does make urine.  I would not recommend med changes at this time.  Plan follow-up with [place] in 6 months or sooner as necessary.  Pixie Casino, MD, [facility], [place] Director of the Advanced Lipid Disorders &  Cardiovascular Risk Reduction Clinic Attending Cardiologist  Direct Dial: [phone]  Fax: [phone]  Website:  [email or web address] 04/21/2022, 10:02 AM

## 2022-04-21 NOTE — Patient Instructions (Signed)
Medication Instructions:  NO CHANGES  *If you need a refill on your cardiac medications before your next appointment, please call your pharmacy*   Follow-Up: At [facility], you and your health needs are our priority.  As part of our continuing mission to provide you with exceptional heart care, we have created designated Provider Care Teams.  These Care Teams include your primary Cardiologist (physician) and Advanced Practice Providers (APPs -  Physician Assistants and Nurse Practitioners) who all work together to provide you with the care you need, when you need it.  We recommend signing up for the patient portal called "MyChart".  Sign up information is provided on this After Visit Summary.  MyChart is used to connect with patients for Virtual Visits (Telemedicine).  Patients are able to view lab/test results, encounter notes, upcoming appointments, etc.  Non-urgent messages can be sent to your provider as well.   To learn more about what you can do with MyChart, go to [email or web address].    Your next appointment:   6 month(s)  Provider:   Pixie Casino, MD

## 2022-04-24 ENCOUNTER — Ambulatory Visit: Payer: Medicare Other | Admitting: Physical Therapy

## 2022-04-25 ENCOUNTER — Other Ambulatory Visit: Payer: Self-pay | Admitting: Cardiovascular Disease

## 2022-04-26 ENCOUNTER — Ambulatory Visit: Payer: Medicare Other | Admitting: Rehabilitation

## 2022-05-01 ENCOUNTER — Ambulatory Visit: Payer: Medicare Other | Admitting: Physical Therapy

## 2022-05-03 ENCOUNTER — Ambulatory Visit: Payer: Medicare Other | Admitting: Rehabilitation

## 2022-05-30 ENCOUNTER — Ambulatory Visit: Payer: Medicare Other | Admitting: Psychiatry

## 2022-05-30 ENCOUNTER — Encounter: Payer: Self-pay | Admitting: Psychiatry

## 2022-05-30 VITALS — BP 152/61 | HR 63 | Ht 71.0 in | Wt 168.5 lb

## 2022-05-30 DIAGNOSIS — R2689 Other abnormalities of gait and mobility: Secondary | ICD-10-CM

## 2022-05-30 DIAGNOSIS — R413 Other amnesia: Secondary | ICD-10-CM

## 2022-05-30 NOTE — Progress Notes (Signed)
   CC:  imbalance, dizziness, headaches  Follow-up Visit  Last visit: 02/28/22  Brief HPI: 89 year old male with a history of CHF, HTN, ESRD on dialysis, CAD, aortic valve stenosis, diabetes who follows in clinic for dizziness, headaches, and imbalance. MRI brain 08/27/21 showed generalized cortical atrophy most pronounced in the medial temporal lobes and insular cortex. Carotid US showed only minimal stenosis.  At his last visit he was referred to PT for balance.   Interval History: He feels his memory is about the same as his last visit. His wife has been helping manage his medications and has taken over finances and cooking.  Balance is also about the same as his last visit. He went to PT in January, though exercises were limited due to persistent hyperglycemia during his sessions. Continues to feel dizziness when standing and fatigue/shortness of breath with exertion. Orthostatic BP in January: 153/63  -> 126/70. Has a history of surgery in his left knee and continues to have pain and instability when standing on the knee. Sometimes feels like his legs are going to give out and like he is dragging his feet.   Physical Exam:   Vital Signs: BP (!) 152/61 (BP Location: Right Arm, Patient Position: Sitting, Cuff Size: Normal)   Pulse 63   Ht 5\' 11"  (1.803 m)   Wt 168 lb 8 oz (76.4 kg)   BMI 23.50 kg/m  GENERAL:  well appearing, in no acute distress, alert  SKIN:  Color, texture, turgor normal. No rashes or lesions HEAD:  Normocephalic/atraumatic. RESP: normal respiratory effort MSK:  No gross joint deformities.   NEUROLOGICAL: Mental Status: Alert, oriented to person, place and time, Follows commands, and Speech fluent and appropriate. Cranial Nerves: PERRL, face symmetric, no dysarthria, hearing grossly intact Motor: 4/5 hip flexion/extension and knee flexion/extension bilaterally. Requires assistance of hands to go from sitting to standing. No tremors or bradykinesia Gait:  decreased stride length, walks with cane  IMPRESSION: 89 year old male with a history of CHF, HTN, ESRD on dialysis, CAD, aortic valve stenosis, diabetes who presents for follow up of dizziness and imbalance. The cause of his imbalance is likely multifactorial due to neuropathy, orthostatic hypotension, left knee instability/pain, and his cardiac comorbidities. Offered MRI L-spine to assess for spinal stenosis as he does report intermittent leg weakness and a sensation his legs will give out from under him. He declined MRI at this time. Provided list of lifestyle measures to help reduce orthostatic intolerance.  MRI brain showed generalized cortical atrophy most prominent in the medial temporal lobes. His memory has started to impact his ADLs. Clinical picture is concerning for dementia. Offered to start a memory medication, which he declined at this time. Patient and wife feel somewhat overwhelmed with doctor appointments and would prefer to focus on his other medical comorbidities at this time.  PLAN: -Patient declined further workup and medications at this time -Provided information regarding lifestyle measures to help with orthostatic intolerance -Return to clinic if he decides he would like to pursue treatment for his memory or undergo further workup for imbalance   Follow-up: as needed  I spent a total of 60 minutes on the date of the service. Discussed medication side effects, adverse reactions and drug interactions. Written educational materials and patient instructions outlining all of the above were given.  Genia Harold, MD 05/30/22 4:10 PM

## 2022-05-30 NOTE — Patient Instructions (Signed)
  Guidelines for the Management of Orthostatic intolerance  1. Make all postural changes from lying to sitting or sitting to standing, slowly.  2. Avoid large meals which can cause low blood pressure during digestion. It is better to eat smaller meals more often than three large meals.  3. Avoid alcohol. Alcohol and cause blood to pool in the legs which may worsen low blood pressure reactions when standing.  4. Perform lower extremity exercises to improve strength of the leg muscles. This will help prevent blood from a pooling in the legs when standing and walking.  5. Use custom fitted elastic support stockings. These will reduce a tendency for blood to pool in the legs when standing and may improve orthostatic intolerance.  6. Use physical counter maneuvers such as leg crossing, squatting, or raising and resting the leg on a chair. These maneuvers increase blood pressure and can improve orthostatic intolerance.

## 2022-06-22 ENCOUNTER — Emergency Department (HOSPITAL_COMMUNITY): Payer: Medicare Other

## 2022-06-22 ENCOUNTER — Encounter (HOSPITAL_COMMUNITY): Payer: Self-pay

## 2022-06-22 ENCOUNTER — Other Ambulatory Visit: Payer: Self-pay

## 2022-06-22 ENCOUNTER — Inpatient Hospital Stay (HOSPITAL_COMMUNITY)
Admission: EM | Admit: 2022-06-22 | Discharge: 2022-07-04 | DRG: 907 | Disposition: A | Discharge status: Home / Self Care | Payer: Medicare Other | Attending: Internal Medicine | Admitting: Internal Medicine

## 2022-06-22 DIAGNOSIS — I700 Atherosclerosis of aorta: Secondary | ICD-10-CM | POA: Diagnosis present

## 2022-06-22 DIAGNOSIS — Z794 Long term (current) use of insulin: Secondary | ICD-10-CM

## 2022-06-22 DIAGNOSIS — E1122 Type 2 diabetes mellitus with diabetic chronic kidney disease: Secondary | ICD-10-CM | POA: Diagnosis not present

## 2022-06-22 DIAGNOSIS — E1165 Type 2 diabetes mellitus with hyperglycemia: Secondary | ICD-10-CM | POA: Diagnosis present

## 2022-06-22 DIAGNOSIS — E11649 Type 2 diabetes mellitus with hypoglycemia without coma: Secondary | ICD-10-CM | POA: Diagnosis not present

## 2022-06-22 DIAGNOSIS — Z7982 Long term (current) use of aspirin: Secondary | ICD-10-CM

## 2022-06-22 DIAGNOSIS — D631 Anemia in chronic kidney disease: Secondary | ICD-10-CM | POA: Diagnosis present

## 2022-06-22 DIAGNOSIS — R197 Diarrhea, unspecified: Secondary | ICD-10-CM | POA: Diagnosis present

## 2022-06-22 DIAGNOSIS — R739 Hyperglycemia, unspecified: Secondary | ICD-10-CM | POA: Diagnosis not present

## 2022-06-22 DIAGNOSIS — Z992 Dependence on renal dialysis: Secondary | ICD-10-CM

## 2022-06-22 DIAGNOSIS — Z7984 Long term (current) use of oral hypoglycemic drugs: Secondary | ICD-10-CM

## 2022-06-22 DIAGNOSIS — Z6825 Body mass index (BMI) 25.0-25.9, adult: Secondary | ICD-10-CM

## 2022-06-22 DIAGNOSIS — Y812 Prosthetic and other implants, materials and accessory general- and plastic-surgery devices associated with adverse incidents: Secondary | ICD-10-CM | POA: Diagnosis present

## 2022-06-22 DIAGNOSIS — N186 End stage renal disease: Secondary | ICD-10-CM | POA: Diagnosis not present

## 2022-06-22 DIAGNOSIS — T85611A Breakdown (mechanical) of intraperitoneal dialysis catheter, initial encounter: Secondary | ICD-10-CM | POA: Diagnosis not present

## 2022-06-22 DIAGNOSIS — I350 Nonrheumatic aortic (valve) stenosis: Secondary | ICD-10-CM

## 2022-06-22 DIAGNOSIS — E876 Hypokalemia: Secondary | ICD-10-CM | POA: Diagnosis present

## 2022-06-22 DIAGNOSIS — E46 Unspecified protein-calorie malnutrition: Secondary | ICD-10-CM | POA: Diagnosis present

## 2022-06-22 DIAGNOSIS — Z961 Presence of intraocular lens: Secondary | ICD-10-CM | POA: Diagnosis present

## 2022-06-22 DIAGNOSIS — Z8546 Personal history of malignant neoplasm of prostate: Secondary | ICD-10-CM

## 2022-06-22 DIAGNOSIS — Z87891 Personal history of nicotine dependence: Secondary | ICD-10-CM

## 2022-06-22 DIAGNOSIS — Z9841 Cataract extraction status, right eye: Secondary | ICD-10-CM

## 2022-06-22 DIAGNOSIS — I5032 Chronic diastolic (congestive) heart failure: Secondary | ICD-10-CM | POA: Diagnosis present

## 2022-06-22 DIAGNOSIS — E785 Hyperlipidemia, unspecified: Secondary | ICD-10-CM | POA: Diagnosis present

## 2022-06-22 DIAGNOSIS — Z79899 Other long term (current) drug therapy: Secondary | ICD-10-CM

## 2022-06-22 DIAGNOSIS — Z8249 Family history of ischemic heart disease and other diseases of the circulatory system: Secondary | ICD-10-CM

## 2022-06-22 DIAGNOSIS — I10 Essential (primary) hypertension: Secondary | ICD-10-CM | POA: Diagnosis present

## 2022-06-22 DIAGNOSIS — E1151 Type 2 diabetes mellitus with diabetic peripheral angiopathy without gangrene: Secondary | ICD-10-CM | POA: Diagnosis present

## 2022-06-22 DIAGNOSIS — I493 Ventricular premature depolarization: Secondary | ICD-10-CM | POA: Diagnosis not present

## 2022-06-22 DIAGNOSIS — K5900 Constipation, unspecified: Secondary | ICD-10-CM | POA: Diagnosis present

## 2022-06-22 DIAGNOSIS — K660 Peritoneal adhesions (postprocedural) (postinfection): Secondary | ICD-10-CM | POA: Diagnosis present

## 2022-06-22 DIAGNOSIS — Z888 Allergy status to other drugs, medicaments and biological substances status: Secondary | ICD-10-CM

## 2022-06-22 DIAGNOSIS — Z66 Do not resuscitate: Secondary | ICD-10-CM | POA: Insufficient documentation

## 2022-06-22 DIAGNOSIS — E119 Type 2 diabetes mellitus without complications: Secondary | ICD-10-CM

## 2022-06-22 DIAGNOSIS — R270 Ataxia, unspecified: Secondary | ICD-10-CM | POA: Diagnosis present

## 2022-06-22 DIAGNOSIS — Z5320 Procedure and treatment not carried out because of patient's decision for unspecified reasons: Secondary | ICD-10-CM | POA: Diagnosis not present

## 2022-06-22 DIAGNOSIS — F0390 Unspecified dementia without behavioral disturbance: Secondary | ICD-10-CM | POA: Diagnosis present

## 2022-06-22 DIAGNOSIS — I2510 Atherosclerotic heart disease of native coronary artery without angina pectoris: Secondary | ICD-10-CM | POA: Diagnosis present

## 2022-06-22 DIAGNOSIS — Z951 Presence of aortocoronary bypass graft: Secondary | ICD-10-CM

## 2022-06-22 DIAGNOSIS — E8770 Fluid overload, unspecified: Secondary | ICD-10-CM

## 2022-06-22 DIAGNOSIS — I132 Hypertensive heart and chronic kidney disease with heart failure and with stage 5 chronic kidney disease, or end stage renal disease: Secondary | ICD-10-CM | POA: Diagnosis present

## 2022-06-22 DIAGNOSIS — Z9842 Cataract extraction status, left eye: Secondary | ICD-10-CM

## 2022-06-22 HISTORY — DX: End stage renal disease: N18.6

## 2022-06-22 LAB — COMPREHENSIVE METABOLIC PANEL
ALT: 15 U/L (ref 0–44)
AST: 21 U/L (ref 15–41)
Albumin: 2.6 g/dL — ABNORMAL LOW (ref 3.5–5.0)
Alkaline Phosphatase: 51 U/L (ref 38–126)
Anion gap: 9 (ref 5–15)
BUN: 56 mg/dL — ABNORMAL HIGH (ref 8–23)
CO2: 25 mmol/L (ref 22–32)
Calcium: 9.5 mg/dL (ref 8.9–10.3)
Chloride: 107 mmol/L (ref 98–111)
Creatinine, Ser: 3.99 mg/dL — ABNORMAL HIGH (ref 0.61–1.24)
GFR, Estimated: 14 mL/min — ABNORMAL LOW (ref >60)
Glucose, Bld: 353 mg/dL — ABNORMAL HIGH (ref 70–99)
Potassium: 3.2 mmol/L — ABNORMAL LOW (ref 3.5–5.1)
Sodium: 141 mmol/L (ref 135–145)
Total Bilirubin: 0.6 mg/dL (ref 0.3–1.2)
Total Protein: 7 g/dL (ref 6.5–8.1)

## 2022-06-22 LAB — CBC WITH DIFFERENTIAL/PLATELET
Abs Immature Granulocytes: 0.03 10*3/uL (ref 0.00–0.07)
Basophils Absolute: 0 10*3/uL (ref 0.0–0.1)
Basophils Relative: 1 %
Eosinophils Absolute: 0.2 10*3/uL (ref 0.0–0.5)
Eosinophils Relative: 2 %
HCT: 31.9 % — ABNORMAL LOW (ref 39.0–52.0)
Hemoglobin: 9.6 g/dL — ABNORMAL LOW (ref 13.0–17.0)
Immature Granulocytes: 0 %
Lymphocytes Relative: 15 %
Lymphs Abs: 1.2 10*3/uL (ref 0.7–4.0)
MCH: 29 pg (ref 26.0–34.0)
MCHC: 30.1 g/dL (ref 30.0–36.0)
MCV: 96.4 fL (ref 80.0–100.0)
Monocytes Absolute: 0.7 10*3/uL (ref 0.1–1.0)
Monocytes Relative: 8 %
Neutro Abs: 6.1 10*3/uL (ref 1.7–7.7)
Neutrophils Relative %: 74 %
Platelets: 325 10*3/uL (ref 150–400)
RBC: 3.31 MIL/uL — ABNORMAL LOW (ref 4.22–5.81)
RDW: 15.6 % — ABNORMAL HIGH (ref 11.5–15.5)
WBC: 8.2 10*3/uL (ref 4.0–10.5)
nRBC: 0 % (ref 0.0–0.2)

## 2022-06-22 LAB — HEMOGLOBIN A1C
Hgb A1c MFr Bld: 8 % — ABNORMAL HIGH (ref 4.8–5.6)
Mean Plasma Glucose: 182.9 mg/dL

## 2022-06-22 LAB — BRAIN NATRIURETIC PEPTIDE: B Natriuretic Peptide: 1252.9 pg/mL — ABNORMAL HIGH (ref 0.0–100.0)

## 2022-06-22 LAB — GLUCOSE, CAPILLARY: Glucose-Capillary: 231 mg/dL — ABNORMAL HIGH (ref 70–99)

## 2022-06-22 LAB — LIPASE, BLOOD: Lipase: 37 U/L (ref 11–51)

## 2022-06-22 MED ORDER — ISOSORBIDE MONONITRATE ER 30 MG PO TB24
30.0000 mg | ORAL_TABLET | Freq: Every day | ORAL | Status: DC
  Administered 2022-06-23 – 2022-07-04 (×8): 30 mg via ORAL
  Filled 2022-06-22 (×9): qty 1

## 2022-06-22 MED ORDER — IOHEXOL 9 MG/ML PO SOLN: 500.0000 mL | ORAL | Status: AC

## 2022-06-22 MED ORDER — POTASSIUM CHLORIDE 20 MEQ PO PACK
40.0000 meq | PACK | Freq: Once | ORAL | Status: DC
  Filled 2022-06-22: qty 2

## 2022-06-22 MED ORDER — ACETAMINOPHEN 325 MG PO TABS: 650.0000 mg | ORAL_TABLET | Freq: Four times a day (QID) | ORAL | Status: DC | PRN

## 2022-06-22 MED ORDER — ONDANSETRON HCL 4 MG/2ML IJ SOLN
4.0000 mg | Freq: Four times a day (QID) | INTRAMUSCULAR | Status: DC | PRN
  Administered 2022-06-24: 4 mg via INTRAVENOUS
  Filled 2022-06-22: qty 2

## 2022-06-22 MED ORDER — ACETAMINOPHEN 650 MG RE SUPP: 650.0000 mg | Freq: Four times a day (QID) | RECTAL | Status: DC | PRN

## 2022-06-22 MED ORDER — ATORVASTATIN CALCIUM 80 MG PO TABS
80.0000 mg | ORAL_TABLET | Freq: Every day | ORAL | Status: DC
  Administered 2022-06-23 – 2022-07-04 (×11): 80 mg via ORAL
  Filled 2022-06-22 (×12): qty 1

## 2022-06-22 MED ORDER — POLYETHYLENE GLYCOL 3350 17 G PO PACK
17.0000 g | PACK | Freq: Two times a day (BID) | ORAL | Status: DC
  Filled 2022-06-22 (×2): qty 1

## 2022-06-22 MED ORDER — INSULIN ASPART 100 UNIT/ML IJ SOLN
0.0000 [IU] | Freq: Three times a day (TID) | INTRAMUSCULAR | Status: DC
  Administered 2022-06-23 (×2): 2 [IU] via SUBCUTANEOUS
  Administered 2022-06-24: 1 [IU] via SUBCUTANEOUS
  Administered 2022-06-24: 2 [IU] via SUBCUTANEOUS
  Administered 2022-06-24: 1 [IU] via SUBCUTANEOUS
  Administered 2022-06-24: 2 [IU] via SUBCUTANEOUS
  Administered 2022-07-01: 1 [IU] via SUBCUTANEOUS
  Administered 2022-07-02 (×2): 2 [IU] via SUBCUTANEOUS
  Administered 2022-07-03: 1 [IU] via SUBCUTANEOUS

## 2022-06-22 MED ORDER — FUROSEMIDE 10 MG/ML IJ SOLN
120.0000 mg | Freq: Once | INTRAVENOUS | Status: AC
  Administered 2022-06-22: 120 mg via INTRAVENOUS
  Filled 2022-06-22: qty 10

## 2022-06-22 MED ORDER — GENTAMICIN SULFATE 0.1 % EX CREA
1.0000 | TOPICAL_CREAM | Freq: Every day | CUTANEOUS | Status: DC
  Filled 2022-06-22: qty 15

## 2022-06-22 MED ORDER — INSULIN ASPART 100 UNIT/ML IJ SOLN
0.0000 [IU] | Freq: Every day | INTRAMUSCULAR | Status: DC
  Administered 2022-06-23: 2 [IU] via SUBCUTANEOUS

## 2022-06-22 MED ORDER — ONDANSETRON HCL 4 MG PO TABS: 4.0000 mg | ORAL_TABLET | Freq: Four times a day (QID) | ORAL | Status: DC | PRN

## 2022-06-22 MED ORDER — INSULIN GLARGINE-YFGN 100 UNIT/ML ~~LOC~~ SOLN
10.0000 [IU] | Freq: Every day | SUBCUTANEOUS | Status: DC
  Administered 2022-06-23: 10 [IU] via SUBCUTANEOUS
  Filled 2022-06-22 (×3): qty 0.1

## 2022-06-22 MED ORDER — LACTULOSE 10 GM/15ML PO SOLN
30.0000 g | Freq: Every day | ORAL | Status: DC
  Administered 2022-06-23 – 2022-07-04 (×8): 30 g via ORAL
  Filled 2022-06-22 (×12): qty 45

## 2022-06-22 MED ORDER — HYDRALAZINE HCL 50 MG PO TABS
50.0000 mg | ORAL_TABLET | Freq: Two times a day (BID) | ORAL | Status: DC
  Administered 2022-06-23 – 2022-07-02 (×14): 50 mg via ORAL
  Filled 2022-06-22 (×15): qty 1

## 2022-06-22 MED ORDER — SEVELAMER CARBONATE 800 MG PO TABS
800.0000 mg | ORAL_TABLET | Freq: Three times a day (TID) | ORAL | Status: DC
  Filled 2022-06-22: qty 1

## 2022-06-22 NOTE — Plan of Care (Signed)
  Problem: Coping: Goal: Ability to adjust to condition or change in health will improve Outcome: Progressing   Problem: Metabolic: Goal: Ability to maintain appropriate glucose levels will improve Outcome: Progressing   Problem: Skin Integrity: Goal: Risk for impaired skin integrity will decrease Outcome: Progressing   Problem: Education: Goal: Knowledge of General Education information will improve Description: Including pain rating scale, medication(s)/side effects and non-pharmacologic comfort measures Outcome: Progressing   Problem: Activity: Goal: Risk for activity intolerance will decrease Outcome: Progressing   Problem: Elimination: Goal: Will not experience complications related to bowel motility Outcome: Progressing Goal: Will not experience complications related to urinary retention Outcome: Progressing   Problem: Pain Managment: Goal: General experience of comfort will improve Outcome: Progressing

## 2022-06-22 NOTE — ED Provider Triage Note (Signed)
Emergency Medicine Provider Triage Evaluation Note  [name] , a 89 y.o. male  was evaluated in triage.  Pt complains of not finishing dialysis earlier today.  Patient reportedly at dialysis and they were unable to finish the "draining" at the end.  Family states that the dialysis center believed had something to do with patient's underlying constipation.  Patient does endorse intermittent constipation with nightly episodes of diarrhea while the patient is sleeping.  He denies abdominal pain at this time.  Review of Systems  Positive: As above Negative: Shortness of breath, chest pain, abdominal pain, nausea, vomiting  Physical Exam  BP (!) 184/75 (BP Location: Right Arm)   Pulse 65   Temp 97.6 F (36.4 C)   Resp 16   Ht  (1.803 m)   Wt 76.4 kg   SpO2 100%   BMI 23.49 kg/m  Gen:   Awake, no distress   Resp:  Normal effort  MSK:   Moves extremities without difficulty  Other:  No abdominal tenderness to palpation  Medical Decision Making  Medically screening exam initiated at 2:59 PM.  Appropriate orders placed.  [name] was informed that the remainder of the evaluation will be completed by another provider, this initial triage assessment does not replace that evaluation, and the importance of remaining in the ED until their evaluation is complete.     Darrick Grinder, PA-C 06/22/22 1501

## 2022-06-22 NOTE — Consult Note (Addendum)
Reason for Consult: To manage dialysis and dialysis related needs Referring Physician: Dr Ignacia Felling is an 89 y.o. male.  HPI: Pt is an 51M with PMH sig for Htn, HLD, AS, dementia, and ESRD on PD who is now seen in consultation at the request of Dr Theresia Lo for management of ESRD and provision of dialysis.    Pt has been having a 6 month period of cognitive decline during which he has found it increasingly difficult to perform PD.  HT RN watched him set up last month and it appeared that he was having more difficulty than realized.  At monthly clinic visit last week with me it was decided to have his wife Olegario Messier retrain on helping him set up to be as independent and functional at home as possible.    To make a long story short, he has bene getting inadequate dialysis for the past month- he bypasses cycles instead of troubleshooting alarms.  He filled today to run a treatment in the [facility] department today and although it filled fine, he was unable to drain.  That, combined with functional decline, concern for volume overload and uremia, we directed him to come to ED.  Please see my previous progress note from today for more details.    Pt is pretty hypertensive in ED.  Olegario Messier is with him as usual.  + constipation and diarrhea alternating.  No abd pain.  + clear fluid.    Dialyzes at [facility] PD CCPD 5 exchanges 2.5L dwell 1 hr 30 min dwell time No pause, no day dwell   Past Medical History:  Diagnosis Date   Aortic stenosis 02/27/2020   mild to moderate AS   Arthritis    "a little in LLE" (06/20/2017)   Ataxia    Chronic diastolic CHF (congestive heart failure) (HCC)    CKD (chronic kidney disease), stage IV (HCC)    Coronary artery disease    a. s/p CABG 1980s. b. stable by cath 05/2017.   Dizziness    Dyslipidemia    Edema 08/08/2017   HANDS & LOWER EXTREMITES   Hypertension    Hypoalbuminemia    Nephrotic range proteinuria    Peripheral artery disease (HCC)    mild    Pneumonia ~ 1950 X 1   Prostate cancer (HCC) ~ 2015   Type II diabetes mellitus (HCC)     Past Surgical History:  Procedure Laterality Date   CARDIAC CATHETERIZATION     "a couple times" (06/20/2017)   CATARACT EXTRACTION W/ INTRAOCULAR LENS  IMPLANT, BILATERAL Bilateral    COLONOSCOPY     CORONARY ARTERY BYPASS GRAFT  03/06/1985   LAD and first diagonal/circumflex (sequential saphenous vein graft,cardioplegia   INGUINAL HERNIA REPAIR Right 12/06/2020   Procedure: OPEN RIGHT INGUINAL HERNIA REPAIR;  Surgeon: Luretha Murphy, MD;  Location: [facility] OR[facility];  Service: General;  Laterality: Right;   INSERTION PROSTATE RADIATION SEED  ~ 2015   NM MYOCAR PERF WALL MOTION  09/12/2006   no ischemia   RIGHT/LEFT HEART CATH AND CORONARY/GRAFT ANGIOGRAPHY N/A 06/21/2017   Procedure: RIGHT/LEFT HEART CATH AND CORONARY/GRAFT ANGIOGRAPHY;  Surgeon: Kathleene Hazel, MD;  Location: [facility] INVASIVE CV LAB;  Service: Cardiovascular;  Laterality: N/A;   US ECHOCARDIOGRAPHY  11/04/2008   trace TR & MR    Family History  Problem Relation Age of Onset   Heart attack Mother     Social History:  reports that he quit smoking about 49 years ago. His  smoking use included cigarettes. He has a 20.00 pack-year smoking history. He has never used smokeless tobacco. He reports that he does not currently use alcohol. He reports that he does not use drugs.  Allergies:  Allergies  Allergen Reactions   Carvedilol     Other reaction(s): dizziness   Clonidine Hcl     Other reaction(s): constipation   Colace [Docusate]     ineffective    Medications: Scheduled:  iohexol  500 mL Oral Q1H   lactulose  30 g Oral Daily   polyethylene glycol  17 g Oral BID   potassium chloride  40 mEq Oral Once     Results for orders placed or performed during the hospital encounter of 06/22/22 (from the past 48 hour(s))  CBC with Differential     Status: Abnormal   Collection Time: 06/22/22  3:07 PM  Result Value Ref Range    WBC 8.2 4.0 - 10.5 K/uL   RBC 3.31 (L) 4.22 - 5.81 MIL/uL   Hemoglobin 9.6 (L) 13.0 - 17.0 g/dL   HCT 16.1 (L) 09.6 - 04.5 %   MCV 96.4 80.0 - 100.0 fL   MCH 29.0 26.0 - 34.0 pg   MCHC 30.1 30.0 - 36.0 g/dL   RDW 40.9 (H) 81.1 - 91.4 %   Platelets 325 150 - 400 K/uL   nRBC 0.0 0.0 - 0.2 %   Neutrophils Relative % 74 %   Neutro Abs 6.1 1.7 - 7.7 K/uL   Lymphocytes Relative 15 %   Lymphs Abs 1.2 0.7 - 4.0 K/uL   Monocytes Relative 8 %   Monocytes Absolute 0.7 0.1 - 1.0 K/uL   Eosinophils Relative 2 %   Eosinophils Absolute 0.2 0.0 - 0.5 K/uL   Basophils Relative 1 %   Basophils Absolute 0.0 0.0 - 0.1 K/uL   Immature Granulocytes 0 %   Abs Immature Granulocytes 0.03 0.00 - 0.07 K/uL    Comment: Performed at [facility], [address], [place], [place] [address]  Comprehensive metabolic panel     Status: Abnormal   Collection Time: 06/22/22  3:07 PM  Result Value Ref Range   Sodium 141 135 - 145 mmol/L   Potassium 3.2 (L) 3.5 - 5.1 mmol/L   Chloride 107 98 - 111 mmol/L   CO2 25 22 - 32 mmol/L   Glucose, Bld 353 (H) 70 - 99 mg/dL    Comment: Glucose reference range applies only to samples taken after fasting for at least 8 hours.   BUN 56 (H) 8 - 23 mg/dL   Creatinine, Ser 6.21 (H) 0.61 - 1.24 mg/dL   Calcium 9.5 8.9 - 30.8 mg/dL   Total Protein 7.0 6.5 - 8.1 g/dL   Albumin 2.6 (L) 3.5 - 5.0 g/dL   AST 21 15 - 41 U/L   ALT 15 0 - 44 U/L   Alkaline Phosphatase 51 38 - 126 U/L   Total Bilirubin 0.6 0.3 - 1.2 mg/dL   GFR, Estimated 14 (L) >60 mL/min    Comment: (NOTE) Calculated using the CKD-EPI Creatinine Equation (2021)    Anion gap 9 5 - 15    Comment: Performed at [facility], [address], [place], [place] [address]  Lipase, blood     Status: None   Collection Time: 06/22/22  3:07 PM  Result Value Ref Range   Lipase 37 11 - 51 U/L    Comment: Performed at [facility], [address] [place], [place] [address]    DG Abd Acute  W/Chest  Result Date: 06/22/2022 CLINICAL DATA:  Abdominal pain EXAM: DG ABDOMEN ACUTE WITH 1 VIEW CHEST COMPARISON:  04/18/2019 FINDINGS: Prior CABG. Cardiomegaly, vascular congestion. Interstitial prominence and patchy right mid and lower lung airspace disease. No visible significant effusion. Nonobstructive bowel gas pattern. No organomegaly or free air. Aortoiliac atherosclerosis. Radiation seeds in the region of the prostate. IMPRESSION: Cardiomegaly with vascular congestion. Interstitial prominence bilaterally with patchy right mid and lower lung airspace opacities. Findings are similar to prior study. This could reflect chronic lung disease although this could reflect recurrent CHF and/or pneumonia. No acute findings in the abdomen. Electronically Signed   By: Charlett Nose M.D.   On: 06/22/2022 18:28    ROS: all other systems reviewed and are negative except as per HPI Blood pressure (!) 174/97, pulse 74, temperature 97.6 F (36.4 C), resp. rate 18, height  (1.803 m), weight 76.4 kg, SpO2 92 %. GEN NAD, lying in bed HEENT EOMI PERRL NECK + JVD PULM bilateral crackles CV RRR ABD distended, hypoactive BS EXT 3+ LE edema NEURO AAO x 3 ACCESS: + PD catheter  Assessment/Plan: 1 Underdialysis/ PD catheter malfunction/ cognitive decline: Pt and wife's primary goal is to remain at home.  I am hoping that this is a simple fix but unclear yet.  His AXR looks like the PD catheter is in the pelvis.  To my eye it looks like there may be some stool in there.  We'll do an aggressive bowel regimen overnight (miralax + lactulose), and then in the AM see if he can drain anything.  Since it filled really without incident I'm not sure if tPA in the catheter would make much of a difference.  I will make him NPO past MN if he's not draining in preparation for an HD catheter.  They really don't want to convert to HD and I get it- if that has to be pursued hopefully it would be just a temporary conversion to  optimize him in terms of volume and uremia and then transition back to PD.  He was previously independent with everything and always had perfect gold report cards so this is tough on him and his wife.  Olegario Messier is willing to help in whatever way she can to aid in having him remain at home therapies.  2 ESRD: pn PD--> see above in #1.  Orders placed for drain in AM.  Getting CT abd/ pelvis too.    3 Hypertension/ volume: overloaded, 120 IV Lasix tonight, normal antihypertensives  4. Anemia of ESRD: ESA as needed  5. Metabolic Bone Disease: calcitriol and binders when eating  6.  Dispo: admitted   Bufford Buttner 06/22/2022, 7:46 PM

## 2022-06-22 NOTE — Assessment & Plan Note (Signed)
Chronic. May need to watch out for hypotension during dialysis.

## 2022-06-22 NOTE — Assessment & Plan Note (Signed)
Verified with pt that he is a DNR. Wife Olegario Messier agrees. She states that she would not want her husband to go through CPR, mechanical ventilation, etc.

## 2022-06-22 NOTE — H&P (Signed)
History and Physical    BAYLOR CORTEZ NWG:[id number] DOB: 1933/05/11 DOA: 06/22/2022  DOS: the patient was seen and examined on 06/22/2022  PCP: Linus Galas, NP   Patient coming from: Home  I have personally briefly reviewed patient's old medical records in [facility]  CC: PD catheter malfunction HPI: 89 year old African-American male history of end-stage renal disease on peritoneal dialysis for last 3 years, moderate aortic stenosis by echocardiogram, type 2 diabetes, coronary disease status post CABG, hypertension, mild cognitive impairment who presents the ER today with issues regarding his peritoneal dialysis.  Unclear exactly what is going on at home but it appears that the patient and his wife have not been able to manage his peritoneal dialysis at home.  Wife states over the last month and a half, the patient's had issues with his peritoneal dialysis.  Patient states that he will hang a bag of dialysate fluid on his home cycler.  But when he wakes up in the morning, the bag of dialysate fluid is full and the waste fluid bag is empty.  Wife has noticed the patient having lower extreme edema for the last week.  Patient was sent to the dialysis center today, per the note from Dr. Signe Colt, and had his peritoneal cavity filled with dialysis fluid.  They had trouble draining the fluid from the cycler or by manual drainage.  This seems to contradict but the patient is saying that happens at home.  Patient was sent to the ER by Dr. Signe Colt.  Temperature 97.6 heart rate 65 blood pressure 184/75 satting 100% on room air. Weight 76.4 kg  Sodium 141, potassium 3.2, chloride 105, bicarb 25, BUN of 56, creatinine 3.99, glucose of 353  White count 8.2, hemoglobin 9.6, platelets of 325  KUB showed nonobstructive gas pattern.  [facility] hospital[facility] contacted for admission.   ED Course: K 3.2, BUN 56, Scr 3.99  Review of Systems:  Review of Systems  Constitutional:  Negative for chills and  fever.  HENT: Negative.    Eyes: Negative.   Respiratory:  Negative for shortness of breath.   Cardiovascular:  Positive for leg swelling.  Gastrointestinal: Negative.   Genitourinary:        Unable to get PD dialysate to fill into abdomen.  Musculoskeletal: Negative.   Skin: Negative.   Neurological:  Positive for weakness.       Worsening confusion at home  Endo/Heme/Allergies: Negative.   Psychiatric/Behavioral: Negative.    All other systems reviewed and are negative.   Past Medical History:  Diagnosis Date   Acute coronary syndrome (HCC) 04/18/2019   Aortic stenosis 02/27/2020   mild to moderate AS   Arthritis    "a little in LLE" (06/20/2017)   Ataxia    Chronic diastolic CHF (congestive heart failure) (HCC)    CKD (chronic kidney disease), stage IV (HCC)    Coronary artery disease    a. s/p CABG 1980s. b. stable by cath 05/2017.   Dizziness    Dyslipidemia    Edema 08/08/2017   HANDS & LOWER EXTREMITES   Hypertension    Hypoalbuminemia    Nephrotic range proteinuria    Peripheral artery disease (HCC)    mild   Pneumonia ~ 1950 X 1   Prostate cancer (HCC) ~ 2015   Type II diabetes mellitus (HCC)     Past Surgical History:  Procedure Laterality Date   CARDIAC CATHETERIZATION     "a couple times" (06/20/2017)   CATARACT EXTRACTION W/ INTRAOCULAR  LENS  IMPLANT, BILATERAL Bilateral    COLONOSCOPY     CORONARY ARTERY BYPASS GRAFT  03/06/1985   LAD and first diagonal/circumflex (sequential saphenous vein graft,cardioplegia   INGUINAL HERNIA REPAIR Right 12/06/2020   Procedure: OPEN RIGHT INGUINAL HERNIA REPAIR;  Surgeon: Luretha Murphy, MD;  Location: [facility];  Service: General;  Laterality: Right;   INSERTION PROSTATE RADIATION SEED  ~ 2015   NM MYOCAR PERF WALL MOTION  09/12/2006   no ischemia   RIGHT/LEFT HEART CATH AND CORONARY/GRAFT ANGIOGRAPHY N/A 06/21/2017   Procedure: RIGHT/LEFT HEART CATH AND CORONARY/GRAFT ANGIOGRAPHY;  Surgeon: Kathleene Hazel, MD;  Location: [facility];  Service: Cardiovascular;  Laterality: N/A;   US ECHOCARDIOGRAPHY  11/04/2008   trace TR & MR     reports that he quit smoking about 49 years ago. His smoking use included cigarettes. He has a 20.00 pack-year smoking history. He has never used smokeless tobacco. He reports that he does not currently use alcohol. He reports that he does not use drugs.  Allergies  Allergen Reactions   Carvedilol     Other reaction(s): dizziness   Clonidine Hcl     Other reaction(s): constipation   Colace [Docusate]     ineffective    Family History  Problem Relation Age of Onset   Heart attack Mother     Prior to Admission medications   Medication Sig Start Date End Date Taking? Authorizing Provider  amLODipine (NORVASC) 10 MG tablet Take 10 mg by mouth at bedtime. Patient not taking: Reported on 04/21/2022    [provider]  aspirin EC 81 MG tablet Take 81 mg by mouth daily.    [provider]  atorvastatin (LIPITOR) 80 MG tablet Take 1 tablet (80 mg total) by mouth daily. 04/06/22   Hilty, Lisette Abu, MD  Azelastine HCl 0.15 % SOLN Place 1 spray into both nostrils daily as needed (allergies).    [provider]  B Complex-C (B COMPLEX-VITAMIN C) CAPS Take by mouth. 01/06/21   [provider]  B Complex-C-Zn-Folic Acid (DIALYVITE 800 WITH ZINC) 0.8 MG TABS Take 1 tablet by mouth daily. 04/13/21   [provider]  calcitRIOL (ROCALTROL) 0.25 MCG capsule Take 0.25 mcg by mouth 3 (three) times a week. 04/21/21   [provider]  Carboxymethylcellulose Sod PF 0.5 % SOLN 1 drop into affected eye as needed    [provider]  carvedilol (COREG) 3.125 MG tablet TAKE 1 TABLET BY MOUTH TWICE A DAY WITH FOOD 03/13/22   Hilty, Lisette Abu, MD  folic acid (FOLVITE) 800 MCG tablet Take 800 mcg by mouth daily.    [provider]  furosemide (LASIX) 80 MG tablet TAKE 1 TABLET (80 MG TOTAL) BY MOUTH 2 (TWO) TIMES  DAILY AT 10 AM AND 5 PM. 04/25/22   Hilty, Lisette Abu, MD  gentamicin cream (GARAMYCIN) 0.1 % Apply topically daily. 04/21/21   [provider]  glimepiride (AMARYL) 4 MG tablet Take 4 mg by mouth daily as needed (diabetes).    [provider]  hydrALAZINE (APRESOLINE) 50 MG tablet Take 1 tablet (50 mg total) by mouth in the morning and at bedtime. Please keep scheduled appointment 01/10/22   Chrystie Nose, MD  HYDROcodone-acetaminophen (NORCO/VICODIN) 5-325 MG tablet Take 1 tablet by mouth every 6 (six) hours as needed for moderate pain. 12/06/20   Luretha Murphy, MD  insulin glargine (LANTUS) 100 UNIT/ML injection Inject 13 Units into the skin at bedtime. 10/29/19  [provider]  isosorbide mononitrate (IMDUR) 30 MG 24 hr tablet 1 tablet in the morning    [provider]  KLOR-CON M10 10 MEQ tablet TAKE 1 TABLET BY MOUTH EVERY DAY 07/27/20   Hilty, Lisette Abu, MD  Multiple Vitamin (MULTIVITAMIN) tablet Take 1 tablet by mouth daily.     [provider]  nitroGLYCERIN (NITROSTAT) 0.4 MG SL tablet Place 1 tablet (0.4 mg total) under the tongue every 5 (five) minutes x 3 doses as needed for chest pain. 04/28/19   Hilty, Lisette Abu, MD  OVER THE COUNTER MEDICATION Place 1 spray into the nose daily as needed. Sovereign Silver Nasal Spray for Immune Support    [provider]  Propylene Glycol (SYSTANE COMPLETE) 0.6 % SOLN Place 1 drop into both eyes daily as needed (Dry eye).    [provider]  sevelamer carbonate (RENVELA) 800 MG tablet Take 800 mg by mouth 5 (five) times daily. 11/09/21   [provider]  tolterodine (DETROL LA) 4 MG 24 hr capsule Take 4 mg by mouth daily. 12/09/21   [provider]  TRADJENTA 5 MG TABS tablet Take 5 mg by mouth every morning.  02/27/17   [provider]  traZODone (DESYREL) 50 MG tablet Take 50 mg by mouth at bedtime as needed for sleep.  07/12/17   [provider]     Physical Exam: Vitals:   06/22/22 1458 06/22/22 1459 06/22/22 1845  BP:  (!) 184/75 (!) 174/97  Pulse:  65 74  Resp:  16 18  Temp:  97.6 F (36.4 C)   SpO2:  100% 92%  Weight: 76.4 kg    Height: 5\' 11"  (1.803 m)      Physical Exam Vitals and nursing note reviewed.  Constitutional:      General: He is not in acute distress.    Appearance: He is not toxic-appearing.     Comments: Chronically ill appearing male  HENT:     Head: Normocephalic and atraumatic.     Nose: Nose normal.  Eyes:     General: No scleral icterus. Cardiovascular:     Rate and Rhythm: Normal rate and regular rhythm.     Pulses: Normal pulses.     Heart sounds: Murmur heard.     Comments: 3/6 SEM LLSB Pulmonary:     Effort: Pulmonary effort is normal.     Breath sounds: Normal breath sounds.  Abdominal:     General: There is distension.     Palpations: Abdomen is soft.     Tenderness: There is no abdominal tenderness. There is no guarding.  Musculoskeletal:     Right lower leg: 2+ Edema present.     Left lower leg: 2+ Edema present.     Comments: + 2 pitting bilateral LE edema  Skin:    General: Skin is warm and dry.     Capillary Refill: Capillary refill takes less than 2 seconds.  Neurological:     General: No focal deficit present.     Mental Status: He is alert.      Labs on Admission: I have personally reviewed following labs and imaging studies  CBC: Recent Labs  Lab 06/22/22 1507  WBC 8.2  NEUTROABS 6.1  HGB 9.6*  HCT 31.9*  MCV 96.4  PLT 325   Basic Metabolic Panel: Recent Labs  Lab 06/22/22 1507  NA 141  K 3.2*  CL 107  CO2 25  GLUCOSE 353*  BUN 56*  CREATININE  3.99*  CALCIUM 9.5   GFR: Estimated Creatinine Clearance: 14.2 mL/min (A) (by C-G formula based on SCr of 3.99 mg/dL (H)). Liver Function Tests: Recent Labs  Lab 06/22/22 1507  AST 21  ALT 15  ALKPHOS 51  BILITOT 0.6  PROT 7.0  ALBUMIN 2.6*   Recent Labs  Lab 06/22/22 1507  LIPASE 37     Radiological Exams on Admission: I have personally reviewed images DG Abd Acute W/Chest  Result Date: 06/22/2022 CLINICAL DATA:  Abdominal pain EXAM: DG ABDOMEN ACUTE WITH 1 VIEW CHEST COMPARISON:  04/18/2019 FINDINGS: Prior CABG. Cardiomegaly, vascular congestion. Interstitial prominence and patchy right mid and lower lung airspace disease. No visible significant effusion. Nonobstructive bowel gas pattern. No organomegaly or free air. Aortoiliac atherosclerosis. Radiation seeds in the region of the prostate. IMPRESSION: Cardiomegaly with vascular congestion. Interstitial prominence bilaterally with patchy right mid and lower lung airspace opacities. Findings are similar to prior study. This could reflect chronic lung disease although this could reflect recurrent CHF and/or pneumonia. No acute findings in the abdomen. Electronically Signed   By: Charlett Nose M.D.   On: 06/22/2022 18:28    EKG: My personal interpretation of EKG shows: NSR    Assessment/Plan Principal Problem:   Peritoneal dialysis catheter dysfunction [facility]) Active Problems:   Moderate aortic stenosis by prior echocardiogram - mean gradient 24.3 mmHg. peak gradient 42.9 mmHg. Aortic valve area, by VTI measures 1.15  cm.   Essential hypertension   DM2 (diabetes mellitus, type 2) (HCC)   Hypokalemia   ESRD on peritoneal dialysis (HCC)   DNR (do not resuscitate)    Assessment and Plan: * Peritoneal dialysis catheter dysfunction (HCC) Observation med/surg bed. Will obtain CT abd/pelvis with oral contrast only to further delineate the severity of pt's constipation. If not significant constipation, pt's PD catheter may need to be changed out(has been there for about years) and pt will need to be temporarily changed to HD.  It also appears that pt and his wife are not as educated on the steps regarding PD at home. They may need more education. Pt was treated with 10 days of cipro starting on 05-25-2022. Wife says that PCP thought  he may have a peritoneal infection.  Moderate aortic stenosis by prior echocardiogram - mean gradient 24.3 mmHg. peak gradient 42.9 mmHg. Aortic valve area, by VTI measures 1.15  cm. Chronic. May need to watch out for hypotension during dialysis.  ESRD on peritoneal dialysis (HCC) There seems to be quite a bit of confusing regarding pt's ability to perform PD at home. Pt states that when he hangs a bag of dialytic fluid on his PD cycler at home, the bag is still full in the AM and the drainage bag on his cycler is empty.  Dr. Signe Colt stated in her note that pt was able to get PD dialytic to infuse today at dialysis center but drainage was slow.  Unclear what exactly is the problem.  Hypokalemia Replaced with oral Kcl.  DM2 (diabetes mellitus, type 2) (HCC) Continue with lantus. Add SSI.  Essential hypertension Stable.  DNR (do not resuscitate) Verified with pt that he is a DNR. Wife Olegario Messier agrees. She states that she would not want her husband to go through CPR, mechanical ventilation, etc.   DVT prophylaxis: SCDs Code Status: DNR/DNI(Do NOT Intubate). Verified with pt and his wife [place] Family Communication: discussed with pt and his wife kathy at bedside  Disposition Plan: return home  Consults called: Signe Colt with nephrology  Admission status: Observation, Med-Surg   Carollee Herter, DO [facility] 06/22/2022, 8:12 PM

## 2022-06-22 NOTE — Progress Notes (Signed)
Pt sent from home therapies department to ED for constellation of issues.  Briefly, this is my home therapies pt on PD.  He's been on PD for about 3 years and overall has done really well.  Over the past 6 months he has been having declining memory and level of functioning.  He has been reporting machine errors and has been not able to properly set up his machine to do adequate dialysis.    Had a home visit by the [facility] RN who noted that setup errors were pretty concerning and maybe more severe than initially realized.    At our last clinic visit last week, we decided to have his wife [name] retrain setting up PD to help him remain at home and be functional as long as possible.  He was previously completely independent with his treatment.  Had a retrain session yesterday.  Got multiple machine errors overnight last night which on-call RN was not alerted to.  Came in to HT today- decision was made to run pt as much of his normal PD prescription as possible and then have them set up and complete another session tonight.    Pt was unable to drain any of his first fill- 2500 mL- either on the cycler or with manual.  Filling was fine with no issues or pain.  His Kt/V is not good and he has multiple missed treatments and has been getting inadequate dialysis.    I am concerned with uremia on top of underlying dementia exacerbating all the issues.    He will need the following: - KUB to assess for PD cath positioning and constipation - aggressive bowel regimen (miralax TID, colace, maybe lactulose if needed) - reattempt of PD fill/ drain if it is discovered that constipation is the primary factor in drain failure - possible conversion to HD, at least temporarily, to optimize him - please note that he has never been on HD before- only PD.  He has orthostatic hypotension and AS that may result in significant intra-dialytic hypotension.    I am happy to field any questions or concerns.   We will of  course formally consult when he's in a room.  Bufford Buttner MD [employer]

## 2022-06-22 NOTE — Subjective & Objective (Signed)
CC: PD catheter malfunction HPI: 89 year old African-American male history of end-stage renal disease on peritoneal dialysis for last 3 years, moderate aortic stenosis by echocardiogram, type 2 diabetes, coronary disease status post CABG, hypertension, mild cognitive impairment who presents the ER today with issues regarding his peritoneal dialysis.  Unclear exactly what is going on at home but it appears that the patient and his wife have not been able to manage his peritoneal dialysis at home.  Wife states over the last month and a half, the patient's had issues with his peritoneal dialysis.  Patient states that he will hang a bag of dialysate fluid on his home cycler.  But when he wakes up in the morning, the bag of dialysate fluid is full and the waste fluid bag is empty.  Wife has noticed the patient having lower extreme edema for the last week.  Patient was sent to the dialysis center today, per the note from Dr. Signe Colt, and had his peritoneal cavity filled with dialysis fluid.  They had trouble draining the fluid from the cycler or by manual drainage.  This seems to contradict but the patient is saying that happens at home.  Patient was sent to the ER by Dr. Signe Colt.  Temperature 97.6 heart rate 65 blood pressure 184/75 satting 100% on room air. Weight 76.4 kg  Sodium 141, potassium 3.2, chloride 105, bicarb 25, BUN of 56, creatinine 3.99, glucose of 353  White count 8.2, hemoglobin 9.6, platelets of 325  KUB showed nonobstructive gas pattern.  [facility] hospitalist contacted for admission.

## 2022-06-22 NOTE — ED Provider Notes (Signed)
[facility] EMERGENCY DEPARTMENT AT [facility] Provider Note   CSN: [id number] Arrival date & time: 06/22/22  1430     History  Chief Complaint  Patient presents with  . Abdominal Pain    [name] is a 89 y.o. male.  Patient is an 89 year old male with a past medical history of ESRD on peritoneal dialysis, CHF, CAD, hypertension, diabetes presenting to the emergency department with a peritoneal dialysis catheter malfunction.  The patient is here with his wife who states that over the last month that his catheter does not seem to be draining appropriately.  They state that he has had increased weight gain and lower extremity swelling.  They state that they were seen at the dialysis center today to evaluate his catheter and the fluids seem to be flowing inappropriately but was not coming out the way that they expected so they recommended that he come to the emergency department.  He states that he has been constipated recently having only small bowel movements each day and they were concerned that it may be related to his constipation.  The history is provided by the patient and the spouse.  Abdominal Pain      Home Medications Prior to Admission medications   Medication Sig Start Date End Date Taking? Authorizing Provider  amLODipine (NORVASC) 10 MG tablet Take 10 mg by mouth at bedtime. Patient not taking: Reported on 04/21/2022    [provider]  aspirin EC 81 MG tablet Take 81 mg by mouth daily.    [provider]  atorvastatin (LIPITOR) 80 MG tablet Take 1 tablet (80 mg total) by mouth daily. 04/06/22   Hilty, Lisette Abu, MD  Azelastine HCl 0.15 % SOLN Place 1 spray into both nostrils daily as needed (allergies).    [provider]  B Complex-C (B COMPLEX-VITAMIN C) CAPS Take by mouth. 01/06/21   [provider]  B Complex-C-Zn-Folic Acid (DIALYVITE 800 WITH ZINC) 0.8 MG TABS Take 1 tablet by mouth daily. 04/13/21   [provider]  calcitRIOL (ROCALTROL) 0.25 MCG capsule Take 0.25 mcg by mouth 3 (three) times a week. 04/21/21   [provider]  Carboxymethylcellulose Sod PF 0.5 % SOLN 1 drop into affected eye as needed    [provider]  carvedilol (COREG) 3.125 MG tablet TAKE 1 TABLET BY MOUTH TWICE A DAY WITH FOOD 03/13/22   Hilty, Lisette Abu, MD  folic acid (FOLVITE) 800 MCG tablet Take 800 mcg by mouth daily.    [provider]  furosemide (LASIX) 80 MG tablet TAKE 1 TABLET (80 MG TOTAL) BY MOUTH 2 (TWO) TIMES DAILY AT 10 AM AND 5 PM. 04/25/22   Hilty, Lisette Abu, MD  gentamicin cream (GARAMYCIN) 0.1 % Apply topically daily. 04/21/21   [provider]  glimepiride (AMARYL) 4 MG tablet Take 4 mg by mouth daily as needed (diabetes).    [provider]  hydrALAZINE (APRESOLINE) 50 MG tablet Take 1 tablet (50 mg total) by mouth in the morning and at bedtime. Please keep scheduled appointment 01/10/22   Chrystie Nose, MD  HYDROcodone-acetaminophen (NORCO/VICODIN) 5-325 MG tablet Take 1 tablet by mouth every 6 (six) hours as needed for moderate pain. 12/06/20   Luretha Murphy, MD  insulin glargine (LANTUS) 100 UNIT/ML injection Inject 13 Units into the skin at bedtime. 10/29/19   [provider]  isosorbide mononitrate (IMDUR) 30 MG 24 hr tablet 1 tablet in the morning    [provider]  KLOR-CON M10 10 MEQ tablet TAKE 1 TABLET BY MOUTH EVERY DAY 07/27/20   Hilty, Lisette Abu, MD  Multiple Vitamin (MULTIVITAMIN) tablet Take 1 tablet by mouth daily.     [provider]  nitroGLYCERIN (NITROSTAT) 0.4 MG SL tablet Place 1 tablet (0.4 mg total) under the tongue every 5 (five) minutes x 3 doses as needed for chest pain. 04/28/19   Hilty, Lisette Abu, MD  OVER THE COUNTER MEDICATION Place 1 spray into the nose daily as needed. Sovereign Silver Nasal Spray for Immune Support    [provider]  Propylene Glycol (SYSTANE COMPLETE) 0.6 % SOLN Place 1  drop into both eyes daily as needed (Dry eye).    [provider]  sevelamer carbonate (RENVELA) 800 MG tablet Take 800 mg by mouth 5 (five) times daily. 11/09/21   [provider]  tolterodine (DETROL LA) 4 MG 24 hr capsule Take 4 mg by mouth daily. 12/09/21   [provider]  TRADJENTA 5 MG TABS tablet Take 5 mg by mouth every morning.  02/27/17   [provider]  traZODone (DESYREL) 50 MG tablet Take 50 mg by mouth at bedtime as needed for sleep.  07/12/17   [provider]      Allergies    Carvedilol, Clonidine hcl, and Colace [docusate]    Review of Systems   Review of Systems  Gastrointestinal:  Positive for abdominal pain.    Physical Exam Updated Vital Signs BP (!) 174/97   Pulse 74   Temp 97.6 F (36.4 C)   Resp 18   Ht  (1.803 m)   Wt 76.4 kg   SpO2 92%   BMI 23.49 kg/m  Physical Exam Vitals and nursing note reviewed.  Constitutional:      General: He is not in acute distress.    Appearance: He is well-developed.  HENT:     Head: Normocephalic and atraumatic.     Mouth/Throat:     Mouth: Mucous membranes are moist.     Pharynx: Oropharynx is clear.  Eyes:     Extraocular Movements: Extraocular movements intact.  Cardiovascular:     Rate and Rhythm: Normal rate and regular rhythm.     Heart sounds: Normal heart sounds.  Pulmonary:     Effort: Pulmonary effort is normal.  Abdominal:     General: Abdomen is flat.     Palpations: Abdomen is soft.     Tenderness: There is no abdominal tenderness.     Comments: Peritoneal catheter in place with clean dressing, no surrounding erythema, warmth or drainage  Skin:    General: Skin is warm and dry.     Comments: 2+ edema to bilateral lower extremities  Neurological:     General: No focal deficit present.     Mental Status: He is alert and oriented to person, place, and time.  Psychiatric:        Mood and Affect: Mood normal.        Behavior: Behavior normal.      ED Results / Procedures / Treatments   Labs (all labs ordered are listed, but only abnormal results are displayed) Labs Reviewed  CBC WITH DIFFERENTIAL/PLATELET - Abnormal; Notable for the following components:      Result Value   RBC 3.31 (*)    Hemoglobin 9.6 (*)    HCT 31.9 (*)    RDW 15.6 (*)    All other components within normal limits  COMPREHENSIVE METABOLIC  PANEL - Abnormal; Notable for the following components:   Potassium 3.2 (*)    Glucose, Bld 353 (*)    BUN 56 (*)    Creatinine, Ser 3.99 (*)    Albumin 2.6 (*)    GFR, Estimated 14 (*)    All other components within normal limits  LIPASE, BLOOD  BRAIN NATRIURETIC PEPTIDE    EKG EKG Interpretation  Date/Time:  Thursday June 22 2022 18:38:33 EDT Ventricular Rate:  79 PR Interval:  160 QRS Duration: 92 QT Interval:  352 QTC Calculation: 404 R Axis:   62 Text Interpretation: Sinus rhythm Borderline repolarization abnormality No significant change since last tracing Confirmed by Elayne Snare ([id number]) on 06/22/2022 6:40:38 PM  Radiology DG Abd Acute W/Chest  Result Date: 06/22/2022 CLINICAL DATA:  Abdominal pain EXAM: DG ABDOMEN ACUTE WITH 1 VIEW CHEST COMPARISON:  04/18/2019 FINDINGS: Prior CABG. Cardiomegaly, vascular congestion. Interstitial prominence and patchy right mid and lower lung airspace disease. No visible significant effusion. Nonobstructive bowel gas pattern. No organomegaly or free air. Aortoiliac atherosclerosis. Radiation seeds in the region of the prostate. IMPRESSION: Cardiomegaly with vascular congestion. Interstitial prominence bilaterally with patchy right mid and lower lung airspace opacities. Findings are similar to prior study. This could reflect chronic lung disease although this could reflect recurrent CHF and/or pneumonia. No acute findings in the abdomen. Electronically Signed   By: Charlett Nose M.D.   On: 06/22/2022 18:28    Procedures Procedures    Medications Ordered in  ED Medications - No data to display  ED Course/ Medical Decision Making/ A&P Clinical Course as of 06/22/22 1928  Thu Jun 22, 2022  1736 I spoke with Dr. Signe Colt of nephrology who recommended admission for bowel reg and if no improvement may need to convert to hemodialysis.  [VK]  1927 I spoke with Dr. Imogene Burn hospitalist who recommended CTAP with oral contrast to evaluate for stool burden and will evaluate the patient for admission. [VK]    Clinical Course User Index [VK] Rexford Maus, DO                             Medical Decision Making This patient presents to the ED with chief complaint(s) of peritoneal dialysis catheter malfunction with pertinent past medical history of ESRD on PD, hypertension, CHF, CAD, diabetes which further complicates the presenting complaint. The complaint involves an extensive differential diagnosis and also carries with it a high risk of complications and morbidity.    The differential diagnosis includes occluded catheter, patient has had no recent infectious symptoms making infection unlikely, electrolyte abnormality, volume overload  Additional history obtained: Additional history obtained from spouse Records reviewed outpatient nephrology records  ED Course and Reassessment: Since arrival to the emergency department he was initially evaluated by provider in triage and had EKG and labs performed.  Patient's labs showed mild hypokalemia 3.2 and mild hyperglycemia of 350 without any signs of DKA.  Anemia worsening from his baseline.  Patient will additionally have a chest and abdomen x-ray to evaluate for stool burden or pulmonary edema with his volume overload and plan will be to talk to nephrology for disposition recommendations.  Independent labs interpretation:  The following labs were independently interpreted: Mild hypokalemia, hyperglycemia without DKA, creatinine at baseline, anemia  Independent visualization of imaging: - I independently  visualized the following imaging with scope of interpretation limited to determining acute life threatening conditions related to emergency care: Chest and abdomen  x-ray, which revealed pulmonary edema, no significant stool burden, nonobstructive bowel gas pattern  Consultation: - Consulted or discussed management/test interpretation w/ external professional: Nephrology, hospitalists  Consideration for admission or further workup: Patient requires admission for his volume overload in the setting of his catheter malfunction Social Determinants of health: N/A    Amount and/or Complexity of Data Reviewed Labs: ordered. Radiology: ordered.  Risk Decision regarding hospitalization.          Final Clinical Impression(s) / ED Diagnoses Final diagnoses:  Peritoneal dialysis catheter dysfunction, initial encounter (HCC)  Hypervolemia, unspecified hypervolemia type  Hyperglycemia    Rx / DC Orders ED Discharge Orders     None         Rexford Maus, DO 06/22/22 1922

## 2022-06-22 NOTE — ED Triage Notes (Signed)
Pt was at dialysis today and the treatment couldn't be completed, because it wouldn't go through the drainage part. The dialysis staff told him they thought it may be due to constipation. Pt denies abdominal pain, N/V/D. Pt states he had a BM last night.

## 2022-06-22 NOTE — ED Notes (Signed)
ED TO INPATIENT HANDOFF REPORT  ED Nurse Name and Phone #: [phone]   S Name/Age/Gender [name] 89 y.o. male Room/Bed: 005C/005C  Code Status   Code Status: DNR  Home/SNF/Other Home Patient oriented to: self, place, time, and situation Is this baseline? Yes   Triage Complete: Triage complete  Chief Complaint Peritoneal dialysis catheter dysfunction [facility] [T85.611A]  Triage Note Pt was at dialysis today and the treatment couldn't be completed, because it wouldn't go through the drainage part. The dialysis staff told him they thought it may be due to constipation. Pt denies abdominal pain, N/V/D. Pt states he had a BM last night.     Allergies Allergies  Allergen Reactions   Carvedilol     Other reaction(s): dizziness   Clonidine Hcl     Other reaction(s): constipation   Colace [Docusate]     ineffective    Level of Care/Admitting Diagnosis ED Disposition     ED Disposition  Admit   Condition  --   Comment  Hospital Area: [facility] [[id number]]  Level of Care: Med-Surg [16]  May place patient in observation at [facility] or [facility] if equivalent level of care is available:: No  Covid Evaluation: Asymptomatic - no recent exposure (last 10 days) testing not required  Diagnosis: Peritoneal dialysis catheter dysfunction [facility]) [[id number]]  Admitting Physician: Imogene Burn, ERIC [[id number]]  Attending Physician: Imogene Burn, ERIC [[id number]]          B Medical/Surgery History Past Medical History:  Diagnosis Date   Acute coronary syndrome (HCC) 04/18/2019   Aortic stenosis 02/27/2020   mild to moderate AS   Arthritis    "a little in LLE" (06/20/2017)   Ataxia    Chronic diastolic CHF (congestive heart failure) (HCC)    CKD (chronic kidney disease), stage IV (HCC)    Coronary artery disease    a. s/p CABG 1980s. b. stable by cath 05/2017.   Dizziness    Dyslipidemia    Edema 08/08/2017   HANDS & LOWER EXTREMITES   Hypertension    Hypoalbuminemia     Nephrotic range proteinuria    Peripheral artery disease (HCC)    mild   Pneumonia ~ 1950 X 1   Prostate cancer (HCC) ~ 2015   Type II diabetes mellitus (HCC)    Past Surgical History:  Procedure Laterality Date   CARDIAC CATHETERIZATION     "a couple times" (06/20/2017)   CATARACT EXTRACTION W/ INTRAOCULAR LENS  IMPLANT, BILATERAL Bilateral    COLONOSCOPY     CORONARY ARTERY BYPASS GRAFT  03/06/1985   LAD and first diagonal/circumflex (sequential saphenous vein graft,cardioplegia   INGUINAL HERNIA REPAIR Right 12/06/2020   Procedure: OPEN RIGHT INGUINAL HERNIA REPAIR;  Surgeon: Luretha Murphy, MD;  Location: [facility] ORS;  Service: General;  Laterality: Right;   INSERTION PROSTATE RADIATION SEED  ~ 2015   NM MYOCAR PERF WALL MOTION  09/12/2006   no ischemia   RIGHT/LEFT HEART CATH AND CORONARY/GRAFT ANGIOGRAPHY N/A 06/21/2017   Procedure: RIGHT/LEFT HEART CATH AND CORONARY/GRAFT ANGIOGRAPHY;  Surgeon: Kathleene Hazel, MD;  Location: [facility] INVASIVE CV LAB;  Service: Cardiovascular;  Laterality: N/A;   US ECHOCARDIOGRAPHY  11/04/2008   trace TR & MR     A IV Location/Drains/Wounds Patient Lines/Drains/Airways Status     Active Line/Drains/Airways     Name Placement date Placement time Site Days   Peripheral IV 06/22/22 20 G Left Antecubital 06/22/22  1858  Antecubital  less than 1  Fistula / Graft Right Other (Comment) --  --  Other (Comment)  --   Incision (Closed) 12/06/20 Groin Right 12/06/20  1733  -- 563            Intake/Output Last 24 hours No intake or output data in the 24 hours ending 06/22/22 1959  Labs/Imaging Results for orders placed or performed during the hospital encounter of 06/22/22 (from the past 48 hour(s))  CBC with Differential     Status: Abnormal   Collection Time: 06/22/22  3:07 PM  Result Value Ref Range   WBC 8.2 4.0 - 10.5 K/uL   RBC 3.31 (L) 4.22 - 5.81 MIL/uL   Hemoglobin 9.6 (L) 13.0 - 17.0 g/dL   HCT 16.1 (L) 09.6 - 04.5 %   MCV  96.4 80.0 - 100.0 fL   MCH 29.0 26.0 - 34.0 pg   MCHC 30.1 30.0 - 36.0 g/dL   RDW 40.9 (H) 81.1 - 91.4 %   Platelets 325 150 - 400 K/uL   nRBC 0.0 0.0 - 0.2 %   Neutrophils Relative % 74 %   Neutro Abs 6.1 1.7 - 7.7 K/uL   Lymphocytes Relative 15 %   Lymphs Abs 1.2 0.7 - 4.0 K/uL   Monocytes Relative 8 %   Monocytes Absolute 0.7 0.1 - 1.0 K/uL   Eosinophils Relative 2 %   Eosinophils Absolute 0.2 0.0 - 0.5 K/uL   Basophils Relative 1 %   Basophils Absolute 0.0 0.0 - 0.1 K/uL   Immature Granulocytes 0 %   Abs Immature Granulocytes 0.03 0.00 - 0.07 K/uL    Comment: Performed at [facility], [address] [place], [place] [address]  Comprehensive metabolic panel     Status: Abnormal   Collection Time: 06/22/22  3:07 PM  Result Value Ref Range   Sodium 141 135 - 145 mmol/L   Potassium 3.2 (L) 3.5 - 5.1 mmol/L   Chloride 107 98 - 111 mmol/L   CO2 25 22 - 32 mmol/L   Glucose, Bld 353 (H) 70 - 99 mg/dL    Comment: Glucose reference range applies only to samples taken after fasting for at least 8 hours.   BUN 56 (H) 8 - 23 mg/dL   Creatinine, Ser 6.21 (H) 0.61 - 1.24 mg/dL   Calcium 9.5 8.9 - 30.8 mg/dL   Total Protein 7.0 6.5 - 8.1 g/dL   Albumin 2.6 (L) 3.5 - 5.0 g/dL   AST 21 15 - 41 U/L   ALT 15 0 - 44 U/L   Alkaline Phosphatase 51 38 - 126 U/L   Total Bilirubin 0.6 0.3 - 1.2 mg/dL   GFR, Estimated 14 (L) >60 mL/min    Comment: (NOTE) Calculated using the CKD-EPI Creatinine Equation (2021)    Anion gap 9 5 - 15    Comment: Performed at [facility], [address] [place], [place] [address]  Lipase, blood     Status: None   Collection Time: 06/22/22  3:07 PM  Result Value Ref Range   Lipase 37 11 - 51 U/L    Comment: Performed at [facility], [address], [place], [place] [address]   DG Abd Acute W/Chest  Result Date: 06/22/2022 CLINICAL DATA:  Abdominal pain EXAM: DG ABDOMEN ACUTE WITH 1 VIEW CHEST COMPARISON:  04/18/2019 FINDINGS: Prior CABG.  Cardiomegaly, vascular congestion. Interstitial prominence and patchy right mid and lower lung airspace disease. No visible significant effusion. Nonobstructive bowel gas pattern. No  organomegaly or free air. Aortoiliac atherosclerosis. Radiation seeds in the region of the prostate. IMPRESSION: Cardiomegaly with vascular congestion. Interstitial prominence bilaterally with patchy right mid and lower lung airspace opacities. Findings are similar to prior study. This could reflect chronic lung disease although this could reflect recurrent CHF and/or pneumonia. No acute findings in the abdomen. Electronically Signed   By: Charlett Nose M.D.   On: 06/22/2022 18:28    Pending Labs Unresulted Labs (From admission, onward)     Start     Ordered   06/22/22 1833  Brain natriuretic peptide  Once,   URGENT        06/22/22 1832            Vitals/Pain Today's Vitals   06/22/22 1458 06/22/22 1459 06/22/22 1845  BP:  (!) 184/75 (!) 174/97  Pulse:  65 74  Resp:  16 18  Temp:  97.6 F (36.4 C)   SpO2:  100% 92%  Weight: 76.4 kg    Height:  (1.803 m)    PainSc: 0-No pain      Isolation Precautions No active isolations  Medications Medications  iohexol (OMNIPAQUE) 9 MG/ML oral solution 500 mL (has no administration in time range)  furosemide (LASIX) 120 mg in dextrose 5 % 50 mL IVPB (has no administration in time range)  potassium chloride (KLOR-CON) packet 40 mEq (has no administration in time range)  lactulose (CHRONULAC) 10 GM/15ML solution 30 g (has no administration in time range)  polyethylene glycol (MIRALAX / GLYCOLAX) packet 17 g (has no administration in time range)    Mobility walks     Focused Assessments Renal Assessment Handoff:  Hemodialysis Schedule: Hemodialysis Schedule: Tuesday/Thursday/Saturday Last Hemodialysis date and time:   Restricted appendage:     R Recommendations: See Admitting Provider Note  Report given to:   Additional Notes:  does dialysis  @ home during the night. Patient is a DNR

## 2022-06-22 NOTE — Assessment & Plan Note (Signed)
Continue with lantus. Add SSI.

## 2022-06-22 NOTE — Assessment & Plan Note (Signed)
There seems to be quite a bit of confusing regarding pt's ability to perform PD at home. Pt states that when he hangs a bag of dialytic fluid on his PD cycler at home, the bag is still full in the AM and the drainage bag on his cycler is empty.  Dr. Signe Colt stated in her note that pt was able to get PD dialytic to infuse today at dialysis center but drainage was slow.  Unclear what exactly is the problem.

## 2022-06-22 NOTE — Progress Notes (Signed)
Pt admitted with wife bedside. Pt is Aox4, pleasant and cooperative. Pt and wife informed of the plan of care. Both agreeable and understanding of CT ordered and bowel regimen. Wife left. Pt given oral contrast and informed of purpose. Pt refused to drink contrast. Pt refused all PM medications including insulins. Dr. Arville Care and Dr. Imogene Burn aware.

## 2022-06-22 NOTE — Assessment & Plan Note (Addendum)
Observation med/surg bed. Will obtain CT abd/pelvis with oral contrast only to further delineate the severity of pt's constipation. If not significant constipation, pt's PD catheter may need to be changed out(has been there for about years) and pt will need to be temporarily changed to HD.  It also appears that pt and his wife are not as educated on the steps regarding PD at home. They may need more education. Pt was treated with 10 days of cipro starting on 05-25-2022. Wife says that PCP thought he may have a peritoneal infection.

## 2022-06-22 NOTE — Assessment & Plan Note (Signed)
Replaced with oral Kcl.

## 2022-06-22 NOTE — Assessment & Plan Note (Signed)
Stable

## 2022-06-23 ENCOUNTER — Observation Stay (HOSPITAL_COMMUNITY): Payer: Medicare Other

## 2022-06-23 DIAGNOSIS — T85691A Other mechanical complication of intraperitoneal dialysis catheter, initial encounter: Secondary | ICD-10-CM | POA: Diagnosis not present

## 2022-06-23 DIAGNOSIS — I700 Atherosclerosis of aorta: Secondary | ICD-10-CM | POA: Diagnosis present

## 2022-06-23 DIAGNOSIS — Z951 Presence of aortocoronary bypass graft: Secondary | ICD-10-CM | POA: Diagnosis not present

## 2022-06-23 DIAGNOSIS — D631 Anemia in chronic kidney disease: Secondary | ICD-10-CM | POA: Diagnosis present

## 2022-06-23 DIAGNOSIS — I5032 Chronic diastolic (congestive) heart failure: Secondary | ICD-10-CM | POA: Diagnosis present

## 2022-06-23 DIAGNOSIS — K660 Peritoneal adhesions (postprocedural) (postinfection): Secondary | ICD-10-CM | POA: Diagnosis not present

## 2022-06-23 DIAGNOSIS — E1122 Type 2 diabetes mellitus with diabetic chronic kidney disease: Secondary | ICD-10-CM | POA: Diagnosis present

## 2022-06-23 DIAGNOSIS — Z87891 Personal history of nicotine dependence: Secondary | ICD-10-CM | POA: Diagnosis not present

## 2022-06-23 DIAGNOSIS — E11649 Type 2 diabetes mellitus with hypoglycemia without coma: Secondary | ICD-10-CM | POA: Diagnosis not present

## 2022-06-23 DIAGNOSIS — Z66 Do not resuscitate: Secondary | ICD-10-CM | POA: Diagnosis present

## 2022-06-23 DIAGNOSIS — I2510 Atherosclerotic heart disease of native coronary artery without angina pectoris: Secondary | ICD-10-CM | POA: Diagnosis not present

## 2022-06-23 DIAGNOSIS — I509 Heart failure, unspecified: Secondary | ICD-10-CM | POA: Diagnosis not present

## 2022-06-23 DIAGNOSIS — N185 Chronic kidney disease, stage 5: Secondary | ICD-10-CM | POA: Diagnosis not present

## 2022-06-23 DIAGNOSIS — E46 Unspecified protein-calorie malnutrition: Secondary | ICD-10-CM | POA: Diagnosis present

## 2022-06-23 DIAGNOSIS — I350 Nonrheumatic aortic (valve) stenosis: Secondary | ICD-10-CM | POA: Diagnosis present

## 2022-06-23 DIAGNOSIS — E1151 Type 2 diabetes mellitus with diabetic peripheral angiopathy without gangrene: Secondary | ICD-10-CM | POA: Diagnosis present

## 2022-06-23 DIAGNOSIS — Z992 Dependence on renal dialysis: Secondary | ICD-10-CM | POA: Diagnosis not present

## 2022-06-23 DIAGNOSIS — Y812 Prosthetic and other implants, materials and accessory general- and plastic-surgery devices associated with adverse incidents: Secondary | ICD-10-CM | POA: Diagnosis present

## 2022-06-23 DIAGNOSIS — E785 Hyperlipidemia, unspecified: Secondary | ICD-10-CM | POA: Diagnosis present

## 2022-06-23 DIAGNOSIS — F0390 Unspecified dementia without behavioral disturbance: Secondary | ICD-10-CM | POA: Diagnosis present

## 2022-06-23 DIAGNOSIS — K5900 Constipation, unspecified: Secondary | ICD-10-CM | POA: Diagnosis present

## 2022-06-23 DIAGNOSIS — Z888 Allergy status to other drugs, medicaments and biological substances status: Secondary | ICD-10-CM | POA: Diagnosis not present

## 2022-06-23 DIAGNOSIS — T85611A Breakdown (mechanical) of intraperitoneal dialysis catheter, initial encounter: Secondary | ICD-10-CM | POA: Diagnosis present

## 2022-06-23 DIAGNOSIS — Z961 Presence of intraocular lens: Secondary | ICD-10-CM | POA: Diagnosis present

## 2022-06-23 DIAGNOSIS — Z794 Long term (current) use of insulin: Secondary | ICD-10-CM | POA: Diagnosis not present

## 2022-06-23 DIAGNOSIS — E1165 Type 2 diabetes mellitus with hyperglycemia: Secondary | ICD-10-CM | POA: Diagnosis present

## 2022-06-23 DIAGNOSIS — R739 Hyperglycemia, unspecified: Secondary | ICD-10-CM | POA: Diagnosis present

## 2022-06-23 DIAGNOSIS — I132 Hypertensive heart and chronic kidney disease with heart failure and with stage 5 chronic kidney disease, or end stage renal disease: Secondary | ICD-10-CM | POA: Diagnosis present

## 2022-06-23 DIAGNOSIS — T82898A Other specified complication of vascular prosthetic devices, implants and grafts, initial encounter: Secondary | ICD-10-CM | POA: Diagnosis not present

## 2022-06-23 DIAGNOSIS — N186 End stage renal disease: Secondary | ICD-10-CM | POA: Diagnosis present

## 2022-06-23 DIAGNOSIS — E876 Hypokalemia: Secondary | ICD-10-CM | POA: Diagnosis present

## 2022-06-23 LAB — COMPREHENSIVE METABOLIC PANEL
ALT: 15 U/L (ref 0–44)
AST: 29 U/L (ref 15–41)
Albumin: 2.7 g/dL — ABNORMAL LOW (ref 3.5–5.0)
Alkaline Phosphatase: 53 U/L (ref 38–126)
Anion gap: 9 (ref 5–15)
BUN: 50 mg/dL — ABNORMAL HIGH (ref 8–23)
CO2: 23 mmol/L (ref 22–32)
Calcium: 9.5 mg/dL (ref 8.9–10.3)
Chloride: 111 mmol/L (ref 98–111)
Creatinine, Ser: 3.85 mg/dL — ABNORMAL HIGH (ref 0.61–1.24)
GFR, Estimated: 15 mL/min — ABNORMAL LOW (ref >60)
Glucose, Bld: 216 mg/dL — ABNORMAL HIGH (ref 70–99)
Potassium: 3 mmol/L — ABNORMAL LOW (ref 3.5–5.1)
Sodium: 143 mmol/L (ref 135–145)
Total Bilirubin: 0.8 mg/dL (ref 0.3–1.2)
Total Protein: 7.4 g/dL (ref 6.5–8.1)

## 2022-06-23 LAB — GLUCOSE, CAPILLARY
Glucose-Capillary: 206 mg/dL — ABNORMAL HIGH (ref 70–99)
Glucose-Capillary: 185 mg/dL — ABNORMAL HIGH (ref 70–99)
Glucose-Capillary: 205 mg/dL — ABNORMAL HIGH (ref 70–99)
Glucose-Capillary: 222 mg/dL — ABNORMAL HIGH (ref 70–99)

## 2022-06-23 LAB — MAGNESIUM: Magnesium: 2 mg/dL (ref 1.7–2.4)

## 2022-06-23 MED ORDER — LINAGLIPTIN 5 MG PO TABS
5.0000 mg | ORAL_TABLET | Freq: Every day | ORAL | Status: DC
  Administered 2022-06-23 – 2022-07-04 (×9): 5 mg via ORAL
  Filled 2022-06-23 (×12): qty 1

## 2022-06-23 MED ORDER — FERRIC CITRATE 1 GM 210 MG(FE) PO TABS
420.0000 mg | ORAL_TABLET | Freq: Three times a day (TID) | ORAL | Status: DC
  Administered 2022-06-24 – 2022-06-26 (×7): 420 mg via ORAL
  Filled 2022-06-23 (×7): qty 2

## 2022-06-23 MED ORDER — POLYETHYLENE GLYCOL 3350 17 G PO PACK
17.0000 g | PACK | Freq: Two times a day (BID) | ORAL | Status: DC
  Administered 2022-06-23 – 2022-07-04 (×20): 17 g via ORAL
  Filled 2022-06-23 (×21): qty 1

## 2022-06-23 MED ORDER — POTASSIUM CHLORIDE 20 MEQ PO PACK
40.0000 meq | PACK | Freq: Once | ORAL | Status: AC
  Administered 2022-06-23: 40 meq via ORAL
  Filled 2022-06-23: qty 2

## 2022-06-23 MED ORDER — CARVEDILOL 3.125 MG PO TABS
3.1250 mg | ORAL_TABLET | Freq: Two times a day (BID) | ORAL | Status: DC
  Administered 2022-06-23 – 2022-07-02 (×14): 3.125 mg via ORAL
  Filled 2022-06-23 (×16): qty 1

## 2022-06-23 MED ORDER — GENTAMICIN SULFATE 0.1 % EX CREA
1.0000 | TOPICAL_CREAM | Freq: Every day | CUTANEOUS | Status: DC
  Administered 2022-06-25 – 2022-06-30 (×6): 1 via TOPICAL
  Filled 2022-06-23: qty 15

## 2022-06-23 MED ORDER — TRAZODONE HCL 50 MG PO TABS
50.0000 mg | ORAL_TABLET | Freq: Every evening | ORAL | Status: DC | PRN
  Administered 2022-06-23 – 2022-07-01 (×6): 50 mg via ORAL
  Filled 2022-06-23 (×6): qty 1

## 2022-06-23 MED ORDER — DELFLEX-LC/2.5% DEXTROSE 394 MOSM/L IP SOLN: INTRAPERITONEAL | Status: DC

## 2022-06-23 MED ORDER — BISACODYL 5 MG PO TBEC
10.0000 mg | DELAYED_RELEASE_TABLET | Freq: Two times a day (BID) | ORAL | Status: AC
  Administered 2022-06-23 – 2022-06-24 (×3): 10 mg via ORAL
  Filled 2022-06-23 (×3): qty 2

## 2022-06-23 NOTE — Progress Notes (Addendum)
[facility] Progress Note   Subjective:   Patient seen and examined at bedside.  Family is not present.  Per nurse he refused bowel prep overnight after wife went home.  Large stool burden noted on CT.  Wife will be here soon.  Patient agreed to do bowel prep today.  Admits to SOB w/exertion.  Denies CP, abdominal pain, n/v/d, weakness and fatigue. Wants to get out of bed. Wife is here-  pt walked into BR wanting to urinate when has the condom cath- has not had any BMs yet-  plan is to try to do PD tonight and if unable to will need [facility] tomorrow   Objective Vitals:   06/22/22 2342 06/23/22 0145 06/23/22 0545 06/23/22 0913  BP:  (!) 165/79 (!) 177/83 139/82  Pulse:  91 93 84  Resp:  18 18 18   Temp:  97.6 F (36.4 C) 98.3 F (36.8 C) 98 F (36.7 C)  TempSrc: Oral Oral Oral   SpO2:  90% 94% 95%  Weight:      Height:       Physical Exam General:well appearing, elderly male in NAD Heart:RRR, no mrg Lungs:mostly CTAB, faint crackles in RLL Abdomen:soft, NTND Extremities:1-2+ LE edema Dialysis Access: PD cath in RLQ   [facility] Weights   06/22/22 1458 06/22/22 2103  Weight: 76.4 kg 83.4 kg    Intake/Output Summary (Last 24 hours) at 06/23/2022 1232 Last data filed at 06/23/2022 0800 Gross per 24 hour  Intake 0 ml  Output 1150 ml  Net -1150 ml    Additional Objective Labs: Basic Metabolic Panel: Recent Labs  Lab 06/22/22 1507  NA 141  K 3.2*  CL 107  CO2 25  GLUCOSE 353*  BUN 56*  CREATININE 3.99*  CALCIUM 9.5   Liver Function Tests: Recent Labs  Lab 06/22/22 1507  AST 21  ALT 15  ALKPHOS 51  BILITOT 0.6  PROT 7.0  ALBUMIN 2.6*   Recent Labs  Lab 06/22/22 1507  LIPASE 37   CBC: Recent Labs  Lab 06/22/22 1507  WBC 8.2  NEUTROABS 6.1  HGB 9.6*  HCT 31.9*  MCV 96.4  PLT 325   CBG: Recent Labs  Lab 06/22/22 2106 06/23/22 0737 06/23/22 1132  GLUCAP 231* 206* 185*    Studies/Results: CT ABDOMEN PELVIS WO CONTRAST  Result  Date: 06/23/2022 CLINICAL DATA:  Peritoneal dialysis catheter dysfunction EXAM: CT ABDOMEN AND PELVIS WITHOUT CONTRAST TECHNIQUE: Multidetector CT imaging of the abdomen and pelvis was performed following the standard protocol without IV contrast. RADIATION DOSE REDUCTION: This exam was performed according to the departmental dose-optimization program which includes automated exposure control, adjustment of the mA and/or kV according to patient size and/or use of iterative reconstruction technique. COMPARISON:  None Available. FINDINGS: Lower chest: Small right pleural effusion. Right base atelectasis. Coronary artery and aortic calcifications. Hepatobiliary: No focal hepatic abnormality. Gallbladder unremarkable. Pancreas: No focal abnormality or ductal dilatation. Spleen: No focal abnormality.  Normal size. Adrenals/Urinary Tract: No adrenal abnormality. No focal renal abnormality. No stones or hydronephrosis. Urinary bladder is unremarkable. Stomach/Bowel: Large stool burden within the colon. Stomach and small bowel decompressed. No bowel obstruction. Vascular/Lymphatic: Aortic atherosclerosis. No evidence of aneurysm or adenopathy. Reproductive: Radiation seeds in the region of the prostate. Other: Moderate free fluid throughout the abdomen and pelvis. Scattered locules of free air predominantly in the upper abdomen. This is likely related to peritoneal dialysis. At peritoneal dialysis catheter is located in the anterior right lower quadrant adjacent to small  bowel loops. Musculoskeletal: No acute bony abnormality. IMPRESSION: Peritoneal dialysis catheter in the anterior right lower quadrant adjacent to small bowel loops. Moderate free fluid and scattered free air, likely related to peritoneal dialysis. Small right pleural effusion.  Right base atelectasis. Large stool burden. Aortic atherosclerosis. Electronically Signed   By: Charlett Nose M.D.   On: 06/23/2022 01:01   DG Abd Acute W/Chest  Result Date:  06/22/2022 CLINICAL DATA:  Abdominal pain EXAM: DG ABDOMEN ACUTE WITH 1 VIEW CHEST COMPARISON:  04/18/2019 FINDINGS: Prior CABG. Cardiomegaly, vascular congestion. Interstitial prominence and patchy right mid and lower lung airspace disease. No visible significant effusion. Nonobstructive bowel gas pattern. No organomegaly or free air. Aortoiliac atherosclerosis. Radiation seeds in the region of the prostate. IMPRESSION: Cardiomegaly with vascular congestion. Interstitial prominence bilaterally with patchy right mid and lower lung airspace opacities. Findings are similar to prior study. This could reflect chronic lung disease although this could reflect recurrent CHF and/or pneumonia. No acute findings in the abdomen. Electronically Signed   By: Charlett Nose M.D.   On: 06/22/2022 18:28    Medications:   atorvastatin  80 mg Oral Daily   bisacodyl  10 mg Oral BID   carvedilol  3.125 mg Oral BID WC   gentamicin cream  1 Application Topical Daily   hydrALAZINE  50 mg Oral Q12H   insulin aspart  0-5 Units Subcutaneous QHS   insulin aspart  0-6 Units Subcutaneous TID WC   insulin glargine-yfgn  10 Units Subcutaneous QHS   isosorbide mononitrate  30 mg Oral Daily   lactulose  30 g Oral Daily   linagliptin  5 mg Oral Daily   polyethylene glycol  17 g Oral BID   potassium chloride  40 mEq Oral Once   sevelamer carbonate  800 mg Oral TID WC    Dialysis Orders: Dialyzes at [facility] PD CCPD 5 exchanges 2.5L dwell 1 hr 30 min dwell time  No pause, no day dwell   Assessment/Plan: 1 Underdialysis/ PD catheter malfunction/ cognitive decline: Pt and wife's primary goal is to remain at home.  CT shows large stool burden and PD catheter in the anterior right lower quadrant adjacent to small bowel loops.  Likely that constipation is cause for his PD cath not draining.  Patient refused bowel regimen last night after wife left, agrees to do today. Miralax + lactulose ordered.  I will make him NPO past MN if he's not  draining in preparation for an HD catheter.  Per his primary nephrologist "they really don't want to convert to HD and I get it- if that has to be pursued hopefully it would be just a temporary conversion to optimize him in terms of volume and uremia and then transition back to PD.  He was previously independent with everything and always had perfect gold report cards so this is tough on him and his wife."  Olegario Messier is willing to help in whatever way she can to aid in having him remain at home therapies.  She went to a class to retrain today to help assist with PD at home now that his cognitive decline prevents him from doing it all himself.    2 ESRD: pn PD--> see above in #1.  Orders placed for drain hopefully this evening followed by regular PD overnight.   3. Hypokalemia - supplement ordered.  Refused to take last night.    4 Hypertension/ volume: overloaded, 120 IV Lasix last night with 1L UOP noted in chart overnight.  Continues to have LE edema, no respiratory distress. BP improved this AM, on carvedilol 3.125mg  BID and hydralazine 50mg  BID   4. Anemia of ESRD: Hgb 9.6.  Check iron studies.     5. Metabolic Bone Disease: Corrected Ca high, hold calcitriol for now.  Check phos. On renvela - will change binders d/t ongoing issues with constipation.    6.  Dispo: admitted to [address] PA-C [facility] 06/23/2022,12:32 PM  LOS: 0 days   Patient seen and examined, agree with above note with above modifications. Plan is to use bowel prep to get bowels to move to make PD cath more functional-  will attempt to hook up tonight -  if does not work-  may need TDC to get HD tomorrow  Annie Sable, MD 06/23/2022

## 2022-06-23 NOTE — Progress Notes (Signed)
PROGRESS NOTE    [name]  ZOX:[id number] DOB: 1933-07-30 DOA: 06/22/2022 PCP: Linus Galas, NP   Brief Narrative:  89 year old African-American male history of end-stage renal disease on peritoneal dialysis for last 3 years, moderate aortic stenosis by echocardiogram, type 2 diabetes, coronary disease status post CABG, hypertension, mild cognitive impairment who presents the ER today with issues regarding his peritoneal dialysis.  Unclear exactly what is going on at home but it appears that the patient and his wife have not been able to manage his peritoneal dialysis at home.  Wife states over the last month and a half, the patient's had issues with his peritoneal dialysis.  Patient states that he will hang a bag of dialysate fluid on his home cycler.  But when he wakes up in the morning, the bag of dialysate fluid is full and the waste fluid bag is empty.  Wife has noticed the patient having lower extreme edema for the last week.   Patient was sent to the dialysis center today, per the note from Dr. Signe Colt, and had his peritoneal cavity filled with dialysis fluid.  They had trouble draining the fluid from the cycler or by manual drainage.  This seems to contradict but the patient is saying that happens at home.   Patient was sent to the ER by Dr. Signe Colt.   Temperature 97.6 heart rate 65 blood pressure 184/75 satting 100% on room air. Weight 76.4 kg   Sodium 141, potassium 3.2, chloride 105, bicarb 25, BUN of 56, creatinine 3.99, glucose of 353   White count 8.2, hemoglobin 9.6, platelets of 325   KUB showed nonobstructive gas pattern.   [facility] hospital[facility] contacted for admission.   Assessment & Plan:   Principal Problem:   Peritoneal dialysis catheter dysfunction (HCC) Active Problems:   Moderate aortic stenosis by prior echocardiogram - mean gradient 24.3 mmHg. peak gradient 42.9 mmHg. Aortic valve area, by VTI measures 1.15  cm.   Essential hypertension   DM2 (diabetes  mellitus, type 2) (HCC)   Hypokalemia   ESRD on peritoneal dialysis (HCC)   DNR (do not resuscitate)  ESRD on PD/peritoneal dialysis catheter dysfunction (HCC) CT abdomen shows heavy burden of stool/constipation.  Nephrology on board and opined that he is PD catheter tip appears to be in pelvis and his constipation could be the reason for occlusion.  They had ordered bowel prep but patient had refused that.  They have talked to the patient and wife again and patient is now agreeable to do that.  They are trying to do PD later today.   Moderate aortic stenosis by prior echocardiogram - mean gradient 24.3 mmHg. peak gradient 42.9 mmHg. Aortic valve area, by VTI measures 1.15  cm. Chronic. May need to watch out for hypotension during dialysis.   Hypokalemia Low again, per nephrology note, supplement ordered.  Will defer to nephrology since he is a dialysis patient.   DM2 (diabetes mellitus, type 2) (HCC) Currently on Tradjenta and Semglee 10 units and SSI.  Blood sugar fairly controlled.     Essential hypertension Stable.  Continue current home medications.  DVT prophylaxis: SCDs Start: 06/22/22 2100   Code Status: DNR  Family Communication:  None present at bedside.  Plan of care discussed with patient in length and he/she verbalized understanding and agreed with it.  Status is: Observation The patient will require care spanning > 2 midnights and should be moved to inpatient because: PD catheter still is clogged.     Estimated  body mass index is 25.64 kg/m as calculated from the following:   Height as of this encounter: 5\' 11"  (1.803 m).   Weight as of this encounter: 83.4 kg.    Nutritional Assessment: Body mass index is 25.64 kg/m.Marland Kitchen Seen by dietician.  I agree with the assessment and plan as outlined below: Nutrition Status:        . Skin Assessment: I have examined the patient's skin and I agree with the wound assessment as performed by the wound care RN as outlined  below:    Consultants:  Nephrology  Procedures:  None  Antimicrobials:  Anti-infectives (From admission, onward)    None         Subjective: Patient seen and examined this morning.  He had no complaints.  He was fully alert and oriented and denied any shortness of breath.  Objective: Vitals:   06/22/22 2342 06/23/22 0145 06/23/22 0545 06/23/22 0913  BP:  (!) 165/79 (!) 177/83 139/82  Pulse:  91 93 84  Resp:  18 18 18   Temp:  97.6 F (36.4 C) 98.3 F (36.8 C) 98 F (36.7 C)  TempSrc: Oral Oral Oral   SpO2:  90% 94% 95%  Weight:      Height:        Intake/Output Summary (Last 24 hours) at 06/23/2022 1345 Last data filed at 06/23/2022 0800 Gross per 24 hour  Intake 0 ml  Output 1150 ml  Net -1150 ml   Filed Weights   06/22/22 1458 06/22/22 2103  Weight: 76.4 kg 83.4 kg    Examination:  General exam: Appears calm and comfortable  Respiratory system: Clear to auscultation. Respiratory effort normal. Cardiovascular system: S1 & S2 heard, RRR. No JVD, murmurs, rubs, gallops or clicks. No pedal edema. Gastrointestinal system: Abdomen is nondistended, soft and nontender. No organomegaly or masses felt. Normal bowel sounds heard. Central nervous system: Alert and oriented. No focal neurological deficits. Extremities: Symmetric 5 x 5 power. Skin: No rashes, lesions or ulcers Psychiatry: Judgement and insight appear normal. Mood & affect appropriate.    Data Reviewed: I have personally reviewed following labs and imaging studies  CBC: Recent Labs  Lab 06/22/22 1507  WBC 8.2  NEUTROABS 6.1  HGB 9.6*  HCT 31.9*  MCV 96.4  PLT 325   Basic Metabolic Panel: Recent Labs  Lab 06/22/22 1507 06/23/22 1131  NA 141 143  K 3.2* 3.0*  CL 107 111  CO2 25 23  GLUCOSE 353* 216*  BUN 56* 50*  CREATININE 3.99* 3.85*  CALCIUM 9.5 9.5  MG  --  2.0   GFR: Estimated Creatinine Clearance: 14.7 mL/min (A) (by C-G formula based on SCr of 3.85 mg/dL (H)). Liver  Function Tests: Recent Labs  Lab 06/22/22 1507 06/23/22 1131  AST 21 29  ALT 15 15  ALKPHOS 51 53  BILITOT 0.6 0.8  PROT 7.0 7.4  ALBUMIN 2.6* 2.7*   Recent Labs  Lab 06/22/22 1507  LIPASE 37   No results for input(s): "AMMONIA" in the last 168 hours. Coagulation Profile: No results for input(s): "INR", "PROTIME" in the last 168 hours. Cardiac Enzymes: No results for input(s): "CKTOTAL", "CKMB", "CKMBINDEX", "TROPONINI" in the last 168 hours. BNP (last 3 results) No results for input(s): "PROBNP" in the last 8760 hours. HbA1C: Recent Labs    06/22/22 2149  HGBA1C 8.0*   CBG: Recent Labs  Lab 06/22/22 2106 06/23/22 0737 06/23/22 1132  GLUCAP 231* 206* 185*   Lipid Profile: No results for  input(s): "CHOL", "HDL", "LDLCALC", "TRIG", "CHOLHDL", "LDLDIRECT" in the last 72 hours. Thyroid Function Tests: No results for input(s): "TSH", "T4TOTAL", "FREET4", "T3FREE", "THYROIDAB" in the last 72 hours. Anemia Panel: No results for input(s): "VITAMINB12", "FOLATE", "FERRITIN", "TIBC", "IRON", "RETICCTPCT" in the last 72 hours. Sepsis Labs: No results for input(s): "PROCALCITON", "LATICACIDVEN" in the last 168 hours.  No results found for this or any previous visit (from the past 240 hour(s)).   Radiology Studies: CT ABDOMEN PELVIS WO CONTRAST  Result Date: 06/23/2022 CLINICAL DATA:  Peritoneal dialysis catheter dysfunction EXAM: CT ABDOMEN AND PELVIS WITHOUT CONTRAST TECHNIQUE: Multidetector CT imaging of the abdomen and pelvis was performed following the standard protocol without IV contrast. RADIATION DOSE REDUCTION: This exam was performed according to the departmental dose-optimization program which includes automated exposure control, adjustment of the mA and/or kV according to patient size and/or use of iterative reconstruction technique. COMPARISON:  None Available. FINDINGS: Lower chest: Small right pleural effusion. Right base atelectasis. Coronary artery and aortic  calcifications. Hepatobiliary: No focal hepatic abnormality. Gallbladder unremarkable. Pancreas: No focal abnormality or ductal dilatation. Spleen: No focal abnormality.  Normal size. Adrenals/Urinary Tract: No adrenal abnormality. No focal renal abnormality. No stones or hydronephrosis. Urinary bladder is unremarkable. Stomach/Bowel: Large stool burden within the colon. Stomach and small bowel decompressed. No bowel obstruction. Vascular/Lymphatic: Aortic atherosclerosis. No evidence of aneurysm or adenopathy. Reproductive: Radiation seeds in the region of the prostate. Other: Moderate free fluid throughout the abdomen and pelvis. Scattered locules of free air predominantly in the upper abdomen. This is likely related to peritoneal dialysis. At peritoneal dialysis catheter is located in the anterior right lower quadrant adjacent to small bowel loops. Musculoskeletal: No acute bony abnormality. IMPRESSION: Peritoneal dialysis catheter in the anterior right lower quadrant adjacent to small bowel loops. Moderate free fluid and scattered free air, likely related to peritoneal dialysis. Small right pleural effusion.  Right base atelectasis. Large stool burden. Aortic atherosclerosis. Electronically Signed   By: Charlett Nose M.D.   On: 06/23/2022 01:01   DG Abd Acute W/Chest  Result Date: 06/22/2022 CLINICAL DATA:  Abdominal pain EXAM: DG ABDOMEN ACUTE WITH 1 VIEW CHEST COMPARISON:  04/18/2019 FINDINGS: Prior CABG. Cardiomegaly, vascular congestion. Interstitial prominence and patchy right mid and lower lung airspace disease. No visible significant effusion. Nonobstructive bowel gas pattern. No organomegaly or free air. Aortoiliac atherosclerosis. Radiation seeds in the region of the prostate. IMPRESSION: Cardiomegaly with vascular congestion. Interstitial prominence bilaterally with patchy right mid and lower lung airspace opacities. Findings are similar to prior study. This could reflect chronic lung disease  although this could reflect recurrent CHF and/or pneumonia. No acute findings in the abdomen. Electronically Signed   By: Charlett Nose M.D.   On: 06/22/2022 18:28    Scheduled Meds:  atorvastatin  80 mg Oral Daily   bisacodyl  10 mg Oral BID   carvedilol  3.125 mg Oral BID WC   gentamicin cream  1 Application Topical Daily   hydrALAZINE  50 mg Oral Q12H   insulin aspart  0-5 Units Subcutaneous QHS   insulin aspart  0-6 Units Subcutaneous TID WC   insulin glargine-yfgn  10 Units Subcutaneous QHS   isosorbide mononitrate  30 mg Oral Daily   lactulose  30 g Oral Daily   linagliptin  5 mg Oral Daily   polyethylene glycol  17 g Oral BID   potassium chloride  40 mEq Oral Once   sevelamer carbonate  800 mg Oral TID WC   Continuous  Infusions:  dialysis solution 2.5% low-MG/low-CA       LOS: 0 days   Hughie Closs, MD [facility]  06/23/2022, 1:45 PM   *Please note that this is a verbal dictation therefore any spelling or grammatical errors are due to the "[employer]agon [employer] One" system interpretation.  Please page via Amion and do not message via secure chat for urgent patient care matters. Secure chat can be used for non urgent patient care matters.  How to contact the [facility] Attending or Consulting provider 7A - 7P or covering provider during after hours 7P -7A, for this patient?  Check the care team in [facility] and look for a) attending/consulting [facility] provider listed and b) the [facility] team listed. Page or secure chat 7A-7P. Log into [email or web address] and use [place]'s universal password to access. If you do not have the password, please contact the hospital operator. Locate the [facility] provider you are looking for under [facility] and page to a number that you can be directly reached. If you still have difficulty reaching the provider, please page the [facility] (Director on Call) for the Hospitalists listed on amion for assistance.

## 2022-06-23 NOTE — Procedures (Addendum)
PD note  Patient alert and oriented to self.  He was cooperative. Dressing was changed.  Site was without drainage or inflammation. Patient had no tenderness to his abdomin during palpation Initial drain was done manually with less than 200 ml obtained.  Patient changed position and tolerated pressure being applied to the PD catheter through the abdomin.    The cycler was started and the drain did not detect any solution.  The fill started.  Patient stated he was in no pain at any point.  Report given to patient's nurse.  I returned to check on him at 2015.  His nurse reports that he had another bowel movement.  The cycler was in dwell and there was no tenderness when I palpated around the catheter.

## 2022-06-24 ENCOUNTER — Inpatient Hospital Stay (HOSPITAL_COMMUNITY): Payer: Medicare Other

## 2022-06-24 DIAGNOSIS — T85611A Breakdown (mechanical) of intraperitoneal dialysis catheter, initial encounter: Secondary | ICD-10-CM | POA: Diagnosis not present

## 2022-06-24 LAB — RENAL FUNCTION PANEL
Albumin: 2.4 g/dL — ABNORMAL LOW (ref 3.5–5.0)
Anion gap: 9 (ref 5–15)
BUN: 50 mg/dL — ABNORMAL HIGH (ref 8–23)
CO2: 23 mmol/L (ref 22–32)
Calcium: 9.3 mg/dL (ref 8.9–10.3)
Chloride: 110 mmol/L (ref 98–111)
Creatinine, Ser: 3.78 mg/dL — ABNORMAL HIGH (ref 0.61–1.24)
GFR, Estimated: 15 mL/min — ABNORMAL LOW (ref >60)
Glucose, Bld: 232 mg/dL — ABNORMAL HIGH (ref 70–99)
Phosphorus: 1.8 mg/dL — ABNORMAL LOW (ref 2.5–4.6)
Potassium: 3.1 mmol/L — ABNORMAL LOW (ref 3.5–5.1)
Sodium: 142 mmol/L (ref 135–145)

## 2022-06-24 LAB — IRON AND TIBC
Iron: 21 ug/dL — ABNORMAL LOW (ref 45–182)
Saturation Ratios: 9 % — ABNORMAL LOW (ref 17.9–39.5)
TIBC: 244 ug/dL — ABNORMAL LOW (ref 250–450)
UIBC: 223 ug/dL

## 2022-06-24 LAB — CBC
HCT: 28.4 % — ABNORMAL LOW (ref 39.0–52.0)
Hemoglobin: 9 g/dL — ABNORMAL LOW (ref 13.0–17.0)
MCH: 29.5 pg (ref 26.0–34.0)
MCHC: 31.7 g/dL (ref 30.0–36.0)
MCV: 93.1 fL (ref 80.0–100.0)
Platelets: 313 10*3/uL (ref 150–400)
RBC: 3.05 MIL/uL — ABNORMAL LOW (ref 4.22–5.81)
RDW: 15.9 % — ABNORMAL HIGH (ref 11.5–15.5)
WBC: 11.5 10*3/uL — ABNORMAL HIGH (ref 4.0–10.5)
nRBC: 0 % (ref 0.0–0.2)

## 2022-06-24 LAB — GLUCOSE, CAPILLARY
Glucose-Capillary: 181 mg/dL — ABNORMAL HIGH (ref 70–99)
Glucose-Capillary: 205 mg/dL — ABNORMAL HIGH (ref 70–99)
Glucose-Capillary: 207 mg/dL — ABNORMAL HIGH (ref 70–99)
Glucose-Capillary: 160 mg/dL — ABNORMAL HIGH (ref 70–99)
Glucose-Capillary: 163 mg/dL — ABNORMAL HIGH (ref 70–99)
Glucose-Capillary: 152 mg/dL — ABNORMAL HIGH (ref 70–99)

## 2022-06-24 MED ORDER — PANTOPRAZOLE SODIUM 40 MG IV SOLR
40.0000 mg | INTRAVENOUS | Status: DC
  Administered 2022-06-24 – 2022-06-26 (×3): 40 mg via INTRAVENOUS
  Filled 2022-06-24 (×3): qty 10

## 2022-06-24 MED ORDER — POTASSIUM CHLORIDE 20 MEQ PO PACK
60.0000 meq | PACK | Freq: Once | ORAL | Status: AC
  Administered 2022-06-24: 60 meq via ORAL
  Filled 2022-06-24: qty 3

## 2022-06-24 MED ORDER — SODIUM CHLORIDE 0.9 % IV SOLN
250.0000 mg | INTRAVENOUS | Status: AC
  Administered 2022-06-24 – 2022-06-30 (×4): 250 mg via INTRAVENOUS
  Filled 2022-06-24 (×4): qty 20

## 2022-06-24 MED ORDER — INSULIN GLARGINE-YFGN 100 UNIT/ML ~~LOC~~ SOLN
8.0000 [IU] | Freq: Two times a day (BID) | SUBCUTANEOUS | Status: DC
  Administered 2022-06-24 – 2022-06-29 (×9): 8 [IU] via SUBCUTANEOUS
  Filled 2022-06-24 (×14): qty 0.08

## 2022-06-24 MED ORDER — BISACODYL 10 MG RE SUPP
10.0000 mg | Freq: Once | RECTAL | Status: AC
  Administered 2022-06-24: 10 mg via RECTAL
  Filled 2022-06-24: qty 1

## 2022-06-24 MED ORDER — MILK AND MOLASSES ENEMA
1.0000 | Freq: Once | RECTAL | Status: AC
  Administered 2022-06-24: 240 mL via RECTAL
  Filled 2022-06-24: qty 240

## 2022-06-24 MED ORDER — FUROSEMIDE 10 MG/ML IJ SOLN
80.0000 mg | Freq: Once | INTRAMUSCULAR | Status: AC
  Administered 2022-06-24: 80 mg via INTRAVENOUS
  Filled 2022-06-24: qty 8

## 2022-06-24 NOTE — Progress Notes (Signed)
Pt. Had a Deviation- 2501 from the PD machine. Pt did not drain well and PA [place] is aware of the pt situation.

## 2022-06-24 NOTE — Progress Notes (Addendum)
[facility] Progress Note   Subjective:   Patient seen and examined at bedside.  2 BM overnights per notes.  Able to manually drain last night and no fluid detected when hooked to cycler.  Initial 2.5L instilled but did not drain per nurse.  Patient denies abdominal pain/tenderness, n/v, CP and SOB.   Objective Vitals:   06/23/22 2009 06/24/22 0433 06/24/22 0500 06/24/22 0727  BP: (!) 151/84 (!) 142/79  131/72  Pulse: 92 77  90  Resp: 18 16  18   Temp: 98.4 F (36.9 C) 98.2 F (36.8 C)  98.7 F (37.1 C)  TempSrc: Oral   Oral  SpO2: 99% 100%  92%  Weight:   84 kg   Height:       Physical Exam General:Chronically ill appearing male in NAD Heart:RRR, +2/6 systolic murmur Lungs:CTAB, nml WOB on RA Abdomen:soft, NTND  Extremities:1+ LE edema Dialysis Access: PD catheter   Filed Weights   06/22/22 1458 06/22/22 2103 06/24/22 0500  Weight: 76.4 kg 83.4 kg 84 kg    Intake/Output Summary (Last 24 hours) at 06/24/2022 0855 Last data filed at 06/24/2022 4098 Gross per 24 hour  Intake 3021 ml  Output 450 ml  Net 2571 ml    Additional Objective Labs: Basic Metabolic Panel: Recent Labs  Lab 06/22/22 1507 06/23/22 1131  NA 141 143  K 3.2* 3.0*  CL 107 111  CO2 25 23  GLUCOSE 353* 216*  BUN 56* 50*  CREATININE 3.99* 3.85*  CALCIUM 9.5 9.5   Liver Function Tests: Recent Labs  Lab 06/22/22 1507 06/23/22 1131  AST 21 29  ALT 15 15  ALKPHOS 51 53  BILITOT 0.6 0.8  PROT 7.0 7.4  ALBUMIN 2.6* 2.7*   Recent Labs  Lab 06/22/22 1507  LIPASE 37   CBC: Recent Labs  Lab 06/22/22 1507  WBC 8.2  NEUTROABS 6.1  HGB 9.6*  HCT 31.9*  MCV 96.4  PLT 325    CBG: Recent Labs  Lab 06/22/22 2106 06/23/22 0737 06/23/22 1132 06/23/22 1634 06/23/22 2105  GLUCAP 231* 206* 185* 205* 222*   Studies/Results: CT ABDOMEN PELVIS WO CONTRAST  Result Date: 06/23/2022 CLINICAL DATA:  Peritoneal dialysis catheter dysfunction EXAM: CT ABDOMEN AND  PELVIS WITHOUT CONTRAST TECHNIQUE: Multidetector CT imaging of the abdomen and pelvis was performed following the standard protocol without IV contrast. RADIATION DOSE REDUCTION: This exam was performed according to the departmental dose-optimization program which includes automated exposure control, adjustment of the mA and/or kV according to patient size and/or use of iterative reconstruction technique. COMPARISON:  None Available. FINDINGS: Lower chest: Small right pleural effusion. Right base atelectasis. Coronary artery and aortic calcifications. Hepatobiliary: No focal hepatic abnormality. Gallbladder unremarkable. Pancreas: No focal abnormality or ductal dilatation. Spleen: No focal abnormality.  Normal size. Adrenals/Urinary Tract: No adrenal abnormality. No focal renal abnormality. No stones or hydronephrosis. Urinary bladder is unremarkable. Stomach/Bowel: Large stool burden within the colon. Stomach and small bowel decompressed. No bowel obstruction. Vascular/Lymphatic: Aortic atherosclerosis. No evidence of aneurysm or adenopathy. Reproductive: Radiation seeds in the region of the prostate. Other: Moderate free fluid throughout the abdomen and pelvis. Scattered locules of free air predominantly in the upper abdomen. This is likely related to peritoneal dialysis. At peritoneal dialysis catheter is located in the anterior right lower quadrant adjacent to small bowel loops. Musculoskeletal: No acute bony abnormality. IMPRESSION: Peritoneal dialysis catheter in the anterior right lower quadrant adjacent to small bowel loops. Moderate free fluid and scattered free  air, likely related to peritoneal dialysis. Small right pleural effusion.  Right base atelectasis. Large stool burden. Aortic atherosclerosis. Electronically Signed   By: Charlett Nose M.D.   On: 06/23/2022 01:01   DG Abd Acute W/Chest  Result Date: 06/22/2022 CLINICAL DATA:  Abdominal pain EXAM: DG ABDOMEN ACUTE WITH 1 VIEW CHEST COMPARISON:   04/18/2019 FINDINGS: Prior CABG. Cardiomegaly, vascular congestion. Interstitial prominence and patchy right mid and lower lung airspace disease. No visible significant effusion. Nonobstructive bowel gas pattern. No organomegaly or free air. Aortoiliac atherosclerosis. Radiation seeds in the region of the prostate. IMPRESSION: Cardiomegaly with vascular congestion. Interstitial prominence bilaterally with patchy right mid and lower lung airspace opacities. Findings are similar to prior study. This could reflect chronic lung disease although this could reflect recurrent CHF and/or pneumonia. No acute findings in the abdomen. Electronically Signed   By: Charlett Nose M.D.   On: 06/22/2022 18:28    Medications:  dialysis solution 2.5% low-MG/low-CA      atorvastatin  80 mg Oral Daily   bisacodyl  10 mg Oral BID   carvedilol  3.125 mg Oral BID WC   ferric citrate  420 mg Oral TID WC   gentamicin cream  1 Application Topical Daily   hydrALAZINE  50 mg Oral Q12H   insulin aspart  0-5 Units Subcutaneous QHS   insulin aspart  0-6 Units Subcutaneous TID WC   insulin glargine-yfgn  8 Units Subcutaneous BID   isosorbide mononitrate  30 mg Oral Daily   lactulose  30 g Oral Daily   linagliptin  5 mg Oral Daily   polyethylene glycol  17 g Oral BID    Dialysis Orders: 1 Underdialysis/ PD catheter malfunction/ cognitive decline: Pt and wife's primary goal is to remain at home.  CT shows large stool burden and PD catheter in the anterior right lower quadrant adjacent to small bowel loops.  Likely that constipation is cause for his PD cath not draining.  Patient initially refused bowel regimen but completed yesterday with 2 BM noted overnight.  PD unable to be completed overnight.  Initially drained around , 1st cycle of 2.5L in but was unable to drain.  Ordered additional medications for bowel regimen in hopes to improve constipation.  If unable to complete PD again today will need cath for temporary HD.    Per his primary nephrologist "they really don't want to convert to HD and I get it- if that has to be pursued hopefully it would be just a temporary conversion to optimize him in terms of volume and uremia and then transition back to PD.  He was previously independent with everything and always had perfect gold report cards so this is tough on him and his wife."  Olegario Messier is willing to help in whatever way she can to aid in having him remain at home therapies.  She went to a class to retrain today to help assist with PD at home now that his cognitive decline prevents him from doing it all himself.    2 ESRD: pn PD--> see above in #1.  Orders placed for manual drain hopefully this evening followed by regular PD overnight.    3. Hypokalemia - supplement ordered.  Repeat labs today.   4 Hypertension/ volume: overloaded, 120 IV Lasix 4/25 with 1L UOP noted in chart overnight. Will order additional 80mg  today to help improve volume. Continues to have LE edema, no respiratory distress. BP improved this AM, on carvedilol 3.125mg  BID and hydralazine 50mg   BID   4. Anemia of ESRD: Hgb 9.6.  Check iron studies.     5. Metabolic Bone Disease: Corrected Ca high, hold calcitriol for now.  Check phos. On renvela - will change binders d/t ongoing issues with constipation.    6.  Dispo: admitted to obs  Virgina Norfolk, PA-C [facility] 06/24/2022,8:55 AM  LOS: 1 day   Patient seen and examined, agree with above note with above modifications. Were unable to get PD established last night but has had more BMs since-  had emesis that appeared coffee ground :(.  Labs did not look terrible yesterday in terms of needing acute RRT-  will check them again -  mostly to see if his hgb has dropped-  abdomen does not appear acute in any way-  will try once again to establish his PD later today and then reassess in the AM     Annie Sable, MD 06/24/2022

## 2022-06-24 NOTE — Progress Notes (Signed)
As I was preparing to give patient his morning meds, he sat up and projectile vomited a large amount of black fluid that had coffee grounds appearance.  Unable to obtain any of this for collection as it soaked into the linens.  MD notified and dialysis nurse notified.

## 2022-06-24 NOTE — Plan of Care (Signed)

## 2022-06-24 NOTE — Progress Notes (Signed)
Attempted to do manual drain but unsuccessful; 0ml drained out of the PD catheter. Lillia Abed Penninger PA-C notified. Abd x-ray pending to assess for constipation.

## 2022-06-24 NOTE — Progress Notes (Signed)
PROGRESS NOTE    [name]  UEA:[id number] DOB: Oct 02, 1933 DOA: 06/22/2022 PCP: Linus Galas, NP   Brief Narrative:  89 year old African-American male history of end-stage renal disease on peritoneal dialysis for last 3 years, moderate aortic stenosis by echocardiogram, type 2 diabetes, coronary disease status post CABG, hypertension, mild cognitive impairment who presented the ER with issues regarding his peritoneal dialysis. it appears that the patient and his wife have not been able to manage his peritoneal dialysis at home.  Wife states over the last month and a half, the patient's had issues with his peritoneal dialysis.  Patient states that he will hang a bag of dialysate fluid on his home cycler.  But when he wakes up in the morning, the bag of dialysate fluid is full and the waste fluid bag is empty.  Wife has noticed the patient having lower extreme edema for the last week.   Patient was sent to the dialysis center, per the note from Dr. Signe Colt, and had his peritoneal cavity filled with dialysis fluid.  They had trouble draining the fluid from the cycler or by manual drainage.  Patient was sent to the ER by Dr. Signe Colt.  Assessment & Plan:   Principal Problem:   Peritoneal dialysis catheter dysfunction (HCC) Active Problems:   Moderate aortic stenosis by prior echocardiogram - mean gradient 24.3 mmHg. peak gradient 42.9 mmHg. Aortic valve area, by VTI measures 1.15  cm.   Essential hypertension   DM2 (diabetes mellitus, type 2) (HCC)   Hypokalemia   ESRD on peritoneal dialysis (HCC)   DNR (do not resuscitate)  ESRD on PD/peritoneal dialysis catheter dysfunction (HCC) CT abdomen shows heavy burden of stool/constipation.  Nephrology on board and opined that he is PD catheter tip appears to be in pelvis and his constipation could be the reason for occlusion.  They had ordered bowel prep and reportedly patient has had couple of bowel movements as well but they were unsuccessful in  draining the PD last night.  Nephrology is following and managing.  They will try again today.  However after I saw him this morning, I was reported by the nurse that patient was noted to have projectile vomiting and it looked like coffee-ground color. ?  Has he started having uremic symptoms now.  When I saw him, he had no complaints.  Hemoglobin remains stable.  Will check hemoglobin every 12 hours.  Monitor closely.  Will start him on Protonix.   Moderate aortic stenosis by prior echocardiogram - mean gradient 24.3 mmHg. peak gradient 42.9 mmHg. Aortic valve area, by VTI measures 1.15  cm. Chronic. May need to watch out for hypotension during dialysis.   Hypokalemia Low again, defer to nephrology.   DM2 (diabetes mellitus, type 2) (HCC) Currently on Tradjenta and Semglee 10 units and SSI.  Blood sugar fairly controlled.     Essential hypertension Stable.  Continue current home medications.  DVT prophylaxis: SCDs Start: 06/22/22 2100   Code Status: DNR  Family Communication:  None present at bedside.  Plan of care discussed with patient in length and he/she verbalized understanding and agreed with it.  Status is: Inpatient Remains inpatient appropriate because: PD is still not working.    Estimated body mass index is 25.83 kg/m as calculated from the following:   Height as of this encounter: 5\' 11"  (1.803 m).   Weight as of this encounter: 84 kg.    Nutritional Assessment: Body mass index is 25.83 kg/m.Marland Kitchen Seen by dietician.  I  agree with the assessment and plan as outlined below: Nutrition Status:        . Skin Assessment: I have examined the patient's skin and I agree with the wound assessment as performed by the wound care RN as outlined below:    Consultants:  Nephrology  Procedures:  None  Antimicrobials:  Anti-infectives (From admission, onward)    None         Subjective: Patient seen and examined.  He has no complaints.  Denied any abdominal  pain.  Objective: Vitals:   06/24/22 0433 06/24/22 0500 06/24/22 0727 06/24/22 0922  BP: (!) 142/79  131/72 (!) 154/87  Pulse: 77  90 84  Resp: 16  18 18   Temp: 98.2 F (36.8 C)  98.7 F (37.1 C) 98.4 F (36.9 C)  TempSrc:   Oral Oral  SpO2: 100%  92% 91%  Weight:  84 kg    Height:        Intake/Output Summary (Last 24 hours) at 06/24/2022 1211 Last data filed at 06/24/2022 0900 Gross per 24 hour  Intake 3021 ml  Output 700 ml  Net 2321 ml    Filed Weights   06/22/22 1458 06/22/22 2103 06/24/22 0500  Weight: 76.4 kg 83.4 kg 84 kg    Examination:  General exam: Appears calm and comfortable  Respiratory system: Clear to auscultation. Respiratory effort normal. Cardiovascular system: S1 & S2 heard, RRR. No JVD, murmurs, rubs, gallops or clicks. No pedal edema. Gastrointestinal system: Abdomen is nondistended, soft and nontender. No organomegaly or masses felt. Normal bowel sounds heard. Central nervous system: Alert and oriented. No focal neurological deficits. Extremities: Symmetric 5 x 5 power. Skin: No rashes, lesions or ulcers.    Data Reviewed: I have personally reviewed following labs and imaging studies  CBC: Recent Labs  Lab 06/22/22 1507 06/24/22 1006  WBC 8.2 11.5*  NEUTROABS 6.1  --   HGB 9.6* 9.0*  HCT 31.9* 28.4*  MCV 96.4 93.1  PLT 325 313    Basic Metabolic Panel: Recent Labs  Lab 06/22/22 1507 06/23/22 1131 06/24/22 1006  NA 141 143 142  K 3.2* 3.0* 3.1*  CL 107 111 110  CO2 25 23 23   GLUCOSE 353* 216* 232*  BUN 56* 50* 50*  CREATININE 3.99* 3.85* 3.78*  CALCIUM 9.5 9.5 9.3  MG  --  2.0  --   PHOS  --   --  1.8*    GFR: Estimated Creatinine Clearance: 14.9 mL/min (A) (by C-G formula based on SCr of 3.78 mg/dL (H)). Liver Function Tests: Recent Labs  Lab 06/22/22 1507 06/23/22 1131 06/24/22 1006  AST 21 29  --   ALT 15 15  --   ALKPHOS 51 53  --   BILITOT 0.6 0.8  --   PROT 7.0 7.4  --   ALBUMIN 2.6* 2.7* 2.4*     Recent Labs  Lab 06/22/22 1507  LIPASE 37    No results for input(s): "AMMONIA" in the last 168 hours. Coagulation Profile: No results for input(s): "INR", "PROTIME" in the last 168 hours. Cardiac Enzymes: No results for input(s): "CKTOTAL", "CKMB", "CKMBINDEX", "TROPONINI" in the last 168 hours. BNP (last 3 results) No results for input(s): "PROBNP" in the last 8760 hours. HbA1C: Recent Labs    06/22/22 2149  HGBA1C 8.0*    CBG: Recent Labs  Lab 06/23/22 2105 06/24/22 0751 06/24/22 0911 06/24/22 0919 06/24/22 1124  GLUCAP 222* 181* 205* 207* 160*    Lipid Profile: No results  for input(s): "CHOL", "HDL", "LDLCALC", "TRIG", "CHOLHDL", "LDLDIRECT" in the last 72 hours. Thyroid Function Tests: No results for input(s): "TSH", "T4TOTAL", "FREET4", "T3FREE", "THYROIDAB" in the last 72 hours. Anemia Panel: No results for input(s): "VITAMINB12", "FOLATE", "FERRITIN", "TIBC", "IRON", "RETICCTPCT" in the last 72 hours. Sepsis Labs: No results for input(s): "PROCALCITON", "LATICACIDVEN" in the last 168 hours.  No results found for this or any previous visit (from the past 240 hour(s)).   Radiology Studies: CT ABDOMEN PELVIS WO CONTRAST  Result Date: 06/23/2022 CLINICAL DATA:  Peritoneal dialysis catheter dysfunction EXAM: CT ABDOMEN AND PELVIS WITHOUT CONTRAST TECHNIQUE: Multidetector CT imaging of the abdomen and pelvis was performed following the standard protocol without IV contrast. RADIATION DOSE REDUCTION: This exam was performed according to the departmental dose-optimization program which includes automated exposure control, adjustment of the mA and/or kV according to patient size and/or use of iterative reconstruction technique. COMPARISON:  None Available. FINDINGS: Lower chest: Small right pleural effusion. Right base atelectasis. Coronary artery and aortic calcifications. Hepatobiliary: No focal hepatic abnormality. Gallbladder unremarkable. Pancreas: No focal  abnormality or ductal dilatation. Spleen: No focal abnormality.  Normal size. Adrenals/Urinary Tract: No adrenal abnormality. No focal renal abnormality. No stones or hydronephrosis. Urinary bladder is unremarkable. Stomach/Bowel: Large stool burden within the colon. Stomach and small bowel decompressed. No bowel obstruction. Vascular/Lymphatic: Aortic atherosclerosis. No evidence of aneurysm or adenopathy. Reproductive: Radiation seeds in the region of the prostate. Other: Moderate free fluid throughout the abdomen and pelvis. Scattered locules of free air predominantly in the upper abdomen. This is likely related to peritoneal dialysis. At peritoneal dialysis catheter is located in the anterior right lower quadrant adjacent to small bowel loops. Musculoskeletal: No acute bony abnormality. IMPRESSION: Peritoneal dialysis catheter in the anterior right lower quadrant adjacent to small bowel loops. Moderate free fluid and scattered free air, likely related to peritoneal dialysis. Small right pleural effusion.  Right base atelectasis. Large stool burden. Aortic atherosclerosis. Electronically Signed   By: Charlett Nose M.D.   On: 06/23/2022 01:01   DG Abd Acute W/Chest  Result Date: 06/22/2022 CLINICAL DATA:  Abdominal pain EXAM: DG ABDOMEN ACUTE WITH 1 VIEW CHEST COMPARISON:  04/18/2019 FINDINGS: Prior CABG. Cardiomegaly, vascular congestion. Interstitial prominence and patchy right mid and lower lung airspace disease. No visible significant effusion. Nonobstructive bowel gas pattern. No organomegaly or free air. Aortoiliac atherosclerosis. Radiation seeds in the region of the prostate. IMPRESSION: Cardiomegaly with vascular congestion. Interstitial prominence bilaterally with patchy right mid and lower lung airspace opacities. Findings are similar to prior study. This could reflect chronic lung disease although this could reflect recurrent CHF and/or pneumonia. No acute findings in the abdomen. Electronically  Signed   By: Charlett Nose M.D.   On: 06/22/2022 18:28    Scheduled Meds:  atorvastatin  80 mg Oral Daily   bisacodyl  10 mg Oral BID   carvedilol  3.125 mg Oral BID WC   ferric citrate  420 mg Oral TID WC   gentamicin cream  1 Application Topical Daily   hydrALAZINE  50 mg Oral Q12H   insulin aspart  0-5 Units Subcutaneous QHS   insulin aspart  0-6 Units Subcutaneous TID WC   insulin glargine-yfgn  8 Units Subcutaneous BID   isosorbide mononitrate  30 mg Oral Daily   lactulose  30 g Oral Daily   linagliptin  5 mg Oral Daily   polyethylene glycol  17 g Oral BID   Continuous Infusions:  dialysis solution 2.5% low-MG/low-CA  LOS: 1 day   Hughie Closs, MD [facility]  06/24/2022, 12:11 PM   *Please note that this is a verbal dictation therefore any spelling or grammatical errors are due to the "[employer]agon [employer] One" system interpretation.  Please page via Amion and do not message via secure chat for urgent patient care matters. Secure chat can be used for non urgent patient care matters.  How to contact the [facility] Attending or Consulting provider 7A - 7P or covering provider during after hours 7P -7A, for this patient?  Check the care team in [facility] and look for a) attending/consulting [facility] provider listed and b) the [facility] team listed. Page or secure chat 7A-7P. Log into [email or web address] and use [place]'s universal password to access. If you do not have the password, please contact the hospital operator. Locate the [facility] provider you are looking for under [facility] and page to a number that you can be directly reached. If you still have difficulty reaching the provider, please page the [facility] (Director on Call) for the Hospitalists listed on amion for assistance.

## 2022-06-25 ENCOUNTER — Encounter (HOSPITAL_COMMUNITY): Payer: Self-pay | Admitting: Internal Medicine

## 2022-06-25 DIAGNOSIS — T85611A Breakdown (mechanical) of intraperitoneal dialysis catheter, initial encounter: Secondary | ICD-10-CM | POA: Diagnosis not present

## 2022-06-25 LAB — GLUCOSE, CAPILLARY
Glucose-Capillary: 97 mg/dL (ref 70–99)
Glucose-Capillary: 140 mg/dL — ABNORMAL HIGH (ref 70–99)
Glucose-Capillary: 115 mg/dL — ABNORMAL HIGH (ref 70–99)
Glucose-Capillary: 106 mg/dL — ABNORMAL HIGH (ref 70–99)

## 2022-06-25 LAB — RENAL FUNCTION PANEL
Albumin: 2.5 g/dL — ABNORMAL LOW (ref 3.5–5.0)
Anion gap: 12 (ref 5–15)
BUN: 50 mg/dL — ABNORMAL HIGH (ref 8–23)
CO2: 24 mmol/L (ref 22–32)
Calcium: 9.4 mg/dL (ref 8.9–10.3)
Chloride: 110 mmol/L (ref 98–111)
Creatinine, Ser: 4.12 mg/dL — ABNORMAL HIGH (ref 0.61–1.24)
GFR, Estimated: 13 mL/min — ABNORMAL LOW (ref >60)
Glucose, Bld: 105 mg/dL — ABNORMAL HIGH (ref 70–99)
Phosphorus: 1.6 mg/dL — ABNORMAL LOW (ref 2.5–4.6)
Potassium: 3.5 mmol/L (ref 3.5–5.1)
Sodium: 146 mmol/L — ABNORMAL HIGH (ref 135–145)

## 2022-06-25 LAB — CBC
HCT: 29.5 % — ABNORMAL LOW (ref 39.0–52.0)
Hemoglobin: 9.3 g/dL — ABNORMAL LOW (ref 13.0–17.0)
MCH: 29.8 pg (ref 26.0–34.0)
MCHC: 31.5 g/dL (ref 30.0–36.0)
MCV: 94.6 fL (ref 80.0–100.0)
Platelets: 360 10*3/uL (ref 150–400)
RBC: 3.12 MIL/uL — ABNORMAL LOW (ref 4.22–5.81)
RDW: 17.1 % — ABNORMAL HIGH (ref 11.5–15.5)
WBC: 12.5 10*3/uL — ABNORMAL HIGH (ref 4.0–10.5)
nRBC: 0.2 % (ref 0.0–0.2)

## 2022-06-25 LAB — HEPATITIS B SURFACE ANTIGEN: Hepatitis B Surface Ag: NONREACTIVE

## 2022-06-25 MED ORDER — FUROSEMIDE 10 MG/ML IJ SOLN
120.0000 mg | Freq: Once | INTRAVENOUS | Status: AC
  Administered 2022-06-25: 120 mg via INTRAVENOUS
  Filled 2022-06-25: qty 10

## 2022-06-25 MED ORDER — CHLORHEXIDINE GLUCONATE CLOTH 2 % EX PADS: 6.0000 | MEDICATED_PAD | Freq: Every day | CUTANEOUS | Status: DC

## 2022-06-25 NOTE — Progress Notes (Signed)
PROGRESS NOTE    [name]  ZOX:[id number] DOB: 26-Jul-1933 DOA: 06/22/2022 PCP: Linus Galas, NP   Brief Narrative:  89 year old African-American male history of end-stage renal disease on peritoneal dialysis for last 3 years, moderate aortic stenosis by echocardiogram, type 2 diabetes, coronary disease status post CABG, hypertension, mild cognitive impairment who presented the ER with issues regarding his peritoneal dialysis. it appears that the patient and his wife have not been able to manage his peritoneal dialysis at home.  Wife states over the last month and a half, the patient's had issues with his peritoneal dialysis.  Patient states that he will hang a bag of dialysate fluid on his home cycler.  But when he wakes up in the morning, the bag of dialysate fluid is full and the waste fluid bag is empty.  Wife has noticed the patient having lower extreme edema for the last week.   Patient was sent to the dialysis center, per the note from Dr. Signe Colt, and had his peritoneal cavity filled with dialysis fluid.  They had trouble draining the fluid from the cycler or by manual drainage.  Patient was sent to the ER by Dr. Signe Colt.  Assessment & Plan:   Principal Problem:   Peritoneal dialysis catheter dysfunction (HCC) Active Problems:   Moderate aortic stenosis by prior echocardiogram - mean gradient 24.3 mmHg. peak gradient 42.9 mmHg. Aortic valve area, by VTI measures 1.15  cm.   Essential hypertension   DM2 (diabetes mellitus, type 2) (HCC)   Hypokalemia   ESRD on peritoneal dialysis (HCC)   DNR (do not resuscitate)  ESRD on PD/peritoneal dialysis catheter dysfunction (HCC) CT abdomen shows heavy burden of stool/constipation.  Nephrology on board and opined that he is PD catheter tip appears to be in pelvis and his constipation could be the reason for occlusion.  They had ordered bowel prep and reportedly patient has had couple of bowel movements as well but they were unsuccessful in  draining the PD last night.  Nephrology is following and managing.  They tried again last night with the PD but once again they are unsuccessful.  They are now planning to do temporary dialysis catheter and temporary IHD until they can fix PD catheter.   Moderate aortic stenosis by prior echocardiogram - mean gradient 24.3 mmHg. peak gradient 42.9 mmHg. Aortic valve area, by VTI measures 1.15  cm. Chronic. May need to watch out for hypotension during dialysis.   Hypokalemia Low again, defer to nephrology.   DM2 (diabetes mellitus, type 2) (HCC) Currently on Tradjenta and Semglee 10 units and SSI.  Blood sugar fairly controlled.     Essential hypertension Stable.  Continue current home medications.  DVT prophylaxis: SCDs Start: 06/22/22 2100   Code Status: DNR  Family Communication:  None present at bedside.  Plan of care discussed with the wife last evening.  Have requested nephrology to give her a call as well.  Status is: Inpatient Remains inpatient appropriate because: PD is still not working.    Estimated body mass index is 25.83 kg/m as calculated from the following:   Height as of this encounter: 5\' 11"  (1.803 m).   Weight as of this encounter: 84 kg.    Nutritional Assessment: Body mass index is 25.83 kg/m.Marland Kitchen Seen by dietician.  I agree with the assessment and plan as outlined below: Nutrition Status:        . Skin Assessment: I have examined the patient's skin and I agree with the wound assessment  as performed by the wound care RN as outlined below:    Consultants:  Nephrology  Procedures:  None  Antimicrobials:  Anti-infectives (From admission, onward)    None         Subjective: Patient seen and examined.  He has no complaints.  Denied any abdominal pain.  Objective: Vitals:   06/24/22 0727 06/24/22 0922 06/24/22 1623 06/24/22 2034  BP: 131/72 (!) 154/87 (!) 150/84 132/81  Pulse: 90 84 84 86  Resp: 18 18 18    Temp: 98.7 F (37.1 C) 98.4 F  (36.9 C) 98.2 F (36.8 C) 98.7 F (37.1 C)  TempSrc: Oral Oral  Oral  SpO2: 92% 91% (!) 83% (!) 85%  Weight:      Height:        Intake/Output Summary (Last 24 hours) at 06/25/2022 1048 Last data filed at 06/25/2022 0800 Gross per 24 hour  Intake 390 ml  Output 450 ml  Net -60 ml    Filed Weights   06/22/22 1458 06/22/22 2103 06/24/22 0500  Weight: 76.4 kg 83.4 kg 84 kg    Examination:  General exam: Appears calm and comfortable  Respiratory system: Clear to auscultation. Respiratory effort normal. Cardiovascular system: S1 & S2 heard, RRR. No JVD, murmurs, rubs, gallops or clicks. No pedal edema. Gastrointestinal system: Abdomen is nondistended, soft and nontender. No organomegaly or masses felt. Normal bowel sounds heard. Central nervous system: Alert and oriented. No focal neurological deficits. Extremities: Symmetric 5 x 5 power. Skin: No rashes, lesions or ulcers.    Data Reviewed: I have personally reviewed following labs and imaging studies  CBC: Recent Labs  Lab 06/22/22 1507 06/24/22 1006 06/25/22 1029  WBC 8.2 11.5* 12.5*  NEUTROABS 6.1  --   --   HGB 9.6* 9.0* 9.3*  HCT 31.9* 28.4* 29.5*  MCV 96.4 93.1 94.6  PLT 325 313 360    Basic Metabolic Panel: Recent Labs  Lab 06/22/22 1507 06/23/22 1131 06/24/22 1006  NA 141 143 142  K 3.2* 3.0* 3.1*  CL 107 111 110  CO2 25 23 23   GLUCOSE 353* 216* 232*  BUN 56* 50* 50*  CREATININE 3.99* 3.85* 3.78*  CALCIUM 9.5 9.5 9.3  MG  --  2.0  --   PHOS  --   --  1.8*    GFR: Estimated Creatinine Clearance: 14.9 mL/min (A) (by C-G formula based on SCr of 3.78 mg/dL (H)). Liver Function Tests: Recent Labs  Lab 06/22/22 1507 06/23/22 1131 06/24/22 1006  AST 21 29  --   ALT 15 15  --   ALKPHOS 51 53  --   BILITOT 0.6 0.8  --   PROT 7.0 7.4  --   ALBUMIN 2.6* 2.7* 2.4*    Recent Labs  Lab 06/22/22 1507  LIPASE 37    No results for input(s): "AMMONIA" in the last 168 hours. Coagulation  Profile: No results for input(s): "INR", "PROTIME" in the last 168 hours. Cardiac Enzymes: No results for input(s): "CKTOTAL", "CKMB", "CKMBINDEX", "TROPONINI" in the last 168 hours. BNP (last 3 results) No results for input(s): "PROBNP" in the last 8760 hours. HbA1C: Recent Labs    06/22/22 2149  HGBA1C 8.0*    CBG: Recent Labs  Lab 06/24/22 0919 06/24/22 1124 06/24/22 1620 06/24/22 2034 06/25/22 0723  GLUCAP 207* 160* 163* 152* 97    Lipid Profile: No results for input(s): "CHOL", "HDL", "LDLCALC", "TRIG", "CHOLHDL", "LDLDIRECT" in the last 72 hours. Thyroid Function Tests: No results for input(s): "  TSH", "T4TOTAL", "FREET4", "T3FREE", "THYROIDAB" in the last 72 hours. Anemia Panel: Recent Labs    06/24/22 1006  TIBC 244*  IRON 21*   Sepsis Labs: No results for input(s): "PROCALCITON", "LATICACIDVEN" in the last 168 hours.  No results found for this or any previous visit (from the past 240 hour(s)).   Radiology Studies: DG Abd Portable 1V  Result Date: 06/24/2022 CLINICAL DATA:  Problem with dialysis catheter. EXAM: PORTABLE ABDOMEN - 1 VIEW COMPARISON:  None Available. FINDINGS: The bowel gas pattern is normal. Dialysis catheter projects in the right abdomen, in similar position to April 25th 2024. IMPRESSION: Dialysis catheter projects in the right abdomen, in similar position to April 25th 2024. Electronically Signed   By: Ted Mcalpine M.D.   On: 06/24/2022 16:22    Scheduled Meds:  atorvastatin  80 mg Oral Daily   carvedilol  3.125 mg Oral BID WC   ferric citrate  420 mg Oral TID WC   gentamicin cream  1 Application Topical Daily   hydrALAZINE  50 mg Oral Q12H   insulin aspart  0-5 Units Subcutaneous QHS   insulin aspart  0-6 Units Subcutaneous TID WC   insulin glargine-yfgn  8 Units Subcutaneous BID   isosorbide mononitrate  30 mg Oral Daily   lactulose  30 g Oral Daily   linagliptin  5 mg Oral Daily   pantoprazole (PROTONIX) IV  40 mg  Intravenous Q24H   polyethylene glycol  17 g Oral BID   Continuous Infusions:  dialysis solution 2.5% low-MG/low-CA     ferric gluconate (FERRLECIT) IVPB 250 mg (06/24/22 1416)     LOS: 2 days   Hughie Closs, MD [facility]  06/25/2022, 10:48 AM   *Please note that this is a verbal dictation therefore any spelling or grammatical errors are due to the "Dragon [employer] One" system interpretation.  Please page via Amion and do not message via secure chat for urgent patient care matters. Secure chat can be used for non urgent patient care matters.  How to contact the [facility] Attending or Consulting provider 7A - 7P or covering provider during after hours 7P -7A, for this patient?  Check the care team in [facility] and look for a) attending/consulting [facility] provider listed and b) the [employer] team listed. Page or secure chat 7A-7P. Log into [email or web address] and use [place]'s universal password to access. If you do not have the password, please contact the hospital operator. Locate the [facility] provider you are looking for under [facility] and page to a number that you can be directly reached. If you still have difficulty reaching the provider, please page the [facility] (Director on Call) for the Hospitalists listed on amion for assistance.

## 2022-06-25 NOTE — Progress Notes (Addendum)
[name] Progress Note   Subjective:   Patient seen and examined at bedside.  Unable to manually drain again this AM after multiple BM with bowel regimen.  Denies CP, SOB, abdominal pain and additional nausea/vomiting.   Objective Vitals:   06/24/22 0727 06/24/22 0922 06/24/22 1623 06/24/22 2034  BP: 131/72 (!) 154/87 (!) 150/84 132/81  Pulse: 90 84 84 86  Resp: 18 18 18    Temp: 98.7 F (37.1 C) 98.4 F (36.9 C) 98.2 F (36.8 C) 98.7 F (37.1 C)  TempSrc: Oral Oral  Oral  SpO2: 92% 91% (!) 83% (!) 85%  Weight:      Height:       Physical Exam General:chronically ill appearing, pleasantly confused male in NAD Heart:RRR, +2/6 systolic murmur Lungs:+crackles in bases, nml WOB on RA Abdomen:soft, NTND, PD catheter in RLQ Extremities:2+ LE edema Dialysis Access: PD catheter   Filed Weights   06/22/22 1458 06/22/22 2103 06/24/22 0500  Weight: 76.4 kg 83.4 kg 84 kg    Intake/Output Summary (Last 24 hours) at 06/25/2022 1039 Last data filed at 06/25/2022 0800 Gross per 24 hour  Intake 390 ml  Output 450 ml  Net -60 ml    Additional Objective Labs: Basic Metabolic Panel: Recent Labs  Lab 06/22/22 1507 06/23/22 1131 06/24/22 1006  NA 141 143 142  K 3.2* 3.0* 3.1*  CL 107 111 110  CO2 25 23 23   GLUCOSE 353* 216* 232*  BUN 56* 50* 50*  CREATININE 3.99* 3.85* 3.78*  CALCIUM 9.5 9.5 9.3  PHOS  --   --  1.8*   Liver Function Tests: Recent Labs  Lab 06/22/22 1507 06/23/22 1131 06/24/22 1006  AST 21 29  --   ALT 15 15  --   ALKPHOS 51 53  --   BILITOT 0.6 0.8  --   PROT 7.0 7.4  --   ALBUMIN 2.6* 2.7* 2.4*   Recent Labs  Lab 06/22/22 1507  LIPASE 37   CBC: Recent Labs  Lab 06/22/22 1507 06/24/22 1006  WBC 8.2 11.5*  NEUTROABS 6.1  --   HGB 9.6* 9.0*  HCT 31.9* 28.4*  MCV 96.4 93.1  PLT 325 313   CBG: Recent Labs  Lab 06/24/22 0919 06/24/22 1124 06/24/22 1620 06/24/22 2034 06/25/22 0723  GLUCAP 207* 160* 163* 152* 97    Iron Studies:  Recent Labs    06/24/22 1006  IRON 21*  TIBC 244*   Lab Results  Component Value Date   INR 1.1 04/19/2019   INR 0.97 06/21/2017   INR 1.0 06/19/2017   Studies/Results: DG Abd Portable 1V  Result Date: 06/24/2022 CLINICAL DATA:  Problem with dialysis catheter. EXAM: PORTABLE ABDOMEN - 1 VIEW COMPARISON:  None Available. FINDINGS: The bowel gas pattern is normal. Dialysis catheter projects in the right abdomen, in similar position to April 25th 2024. IMPRESSION: Dialysis catheter projects in the right abdomen, in similar position to April 25th 2024. Electronically Signed   By: Ted Mcalpine M.D.   On: 06/24/2022 16:22    Medications:  dialysis solution 2.5% low-MG/low-CA     ferric gluconate (FERRLECIT) IVPB 250 mg (06/24/22 1416)    atorvastatin  80 mg Oral Daily   carvedilol  3.125 mg Oral BID WC   ferric citrate  420 mg Oral TID WC   gentamicin cream  1 Application Topical Daily   hydrALAZINE  50 mg Oral Q12H   insulin aspart  0-5 Units Subcutaneous QHS   insulin aspart  0-6 Units Subcutaneous TID WC   insulin glargine-yfgn  8 Units Subcutaneous BID   isosorbide mononitrate  30 mg Oral Daily   lactulose  30 g Oral Daily   linagliptin  5 mg Oral Daily   pantoprazole (PROTONIX) IV  40 mg Intravenous Q24H   polyethylene glycol  17 g Oral BID    Dialysis Orders: [facility] PD CCPD 5 exchanges 2.5L dwell 1 hr 30 min dwell time No pause, no day dwell    Assessment/Plan: 1 Underdialysis/ PD catheter malfunction/ cognitive decline: Pt and wife's primary goal is to remain at home.  CT shows large stool burden and PD catheter in the anterior right lower quadrant adjacent to small bowel loops.  Likely that constipation is cause for his PD cath not draining.  Completed bowel regimen. Multiple attempts to drain unsuccessful.  Ordered placed for [facility] by IR for hopefully temporary HD while determining cause for PD catheter dysfunction.   Per his primary nephrologist  "they really don't want to convert to HD and I get it- if that has to be pursued hopefully it would be just a temporary conversion to optimize him in terms of volume and uremia and then transition back to PD.  He was previously independent with everything and always had perfect gold report cards so this is tough on him and his wife."  [name] is willing to help in whatever way she can to aid in having him remain at home therapies.  She went to a class to retrain today to help assist with PD at home now that his cognitive decline prevents him from doing it all himself.    2 ESRD: pn PD--> see above in #1.  Order placed to [facility] placement by IR with HD to follow.     3. Hypokalemia - supplement given.  Repeat labs today.   4 Hypertension/ volume: overloaded, Order lasix to help improve volume. +LE edema, no respiratory distress. BP improved this AM, on carvedilol 3.125mg  BID and hydralazine 50mg  BID   4. Anemia of ESRD: Last Hgb 9.0. tsat 9%, order IV iron course.  5. Metabolic Bone Disease: Corrected Ca high, hold calcitriol for now.  Phos low - hold binders.    6.  Dispo: Will need TDC placement with arrangement for temporary OP HD.   Nathaniel Norfolk, PA-C [facility] 06/25/2022,10:39 AM  LOS: 2 days   Patient seen and examined, agree with above note with above modifications. Patient alert, sitting on edge of bed-  pleasant dementia-  unfortunately we have not been able to get his PD cath to work -  tried several times-  will need to make arrangements for [facility] and HD since has been some time since he had HD-  will consider surgery consult tomorrow to see if they can be of assist to get his PD cath to be functional  Annie Sable, MD 06/25/2022

## 2022-06-25 NOTE — Consult Note (Addendum)
Chief Complaint: Patient was seen in consultation today for [facility] placement Chief Complaint  Patient presents with   Abdominal Pain   at the request of Penninger, Lou Cal  Referring Physician(s):  Penninger, Lou Cal  Supervising Physician: Malachy Moan  Patient Status: [facility] - In-pt  History of Present Illness: [name] is a 89 y.o. male with PMHs of CHF, CAD s/p CABG in 1980s, HTN, prostates CA, DM, and ESRD on PD who was referred to IR for [facility] placement.   Patient has been on PD for last 3 years but PD started problematic for last 1.5 months. Patient was seen at dialysis center and it was found that the PD was non functional, he was sent to ED for further eval and management.   CT AP on /426 showed large burden stool burden with peritoneal dialysis catheter in the anterior right lower quadrant adjacent to small bowel loops. It was thought that the PD may not be functioning due to stool burden, patient placed on laxatives and had multiple BMs, but PD still not functioning.  TDC placement was recommended to the patient and his spouse,  they have been hesitant to convert to HD, but understand that the patient is in need of alternate dialysis access while he is being evaluated by surgeon for possible PD revision. His surgeon is at [facility], PD cannot be revised during the admission at [facility] per nephrology.   Patient sitting on the side of the bed, not in acute distress.  Denise headache, fever, chills, shortness of breath, cough, chest pain, abdominal pain, nausea ,vomiting, and bleeding. Patient is oriented to self only, states that "you need to talk to my wife."     Past Medical History:  Diagnosis Date   Acute coronary syndrome (HCC) 04/18/2019   Aortic stenosis 02/27/2020   mild to moderate AS   Arthritis    "a little in LLE" (06/20/2017)   Ataxia    Chronic diastolic CHF (congestive heart failure) (HCC)    CKD (chronic kidney disease), stage IV (HCC)     Coronary artery disease    a. s/p CABG 1980s. b. stable by cath 05/2017.   Dizziness    Dyslipidemia    Edema 08/08/2017   HANDS & LOWER EXTREMITES   Hypertension    Hypoalbuminemia    Nephrotic range proteinuria    Peripheral artery disease (HCC)    mild   Pneumonia ~ 1950 X 1   Prostate cancer (HCC) ~ 2015   Type II diabetes mellitus (HCC)     Past Surgical History:  Procedure Laterality Date   CARDIAC CATHETERIZATION     "a couple times" (06/20/2017)   CATARACT EXTRACTION W/ INTRAOCULAR LENS  IMPLANT, BILATERAL Bilateral    COLONOSCOPY     CORONARY ARTERY BYPASS GRAFT  03/06/1985   LAD and first diagonal/circumflex (sequential saphenous vein graft,cardioplegia   INGUINAL HERNIA REPAIR Right 12/06/2020   Procedure: OPEN RIGHT INGUINAL HERNIA REPAIR;  Surgeon: Luretha Murphy, MD;  Location: [facility] ORS;  Service: General;  Laterality: Right;   INSERTION PROSTATE RADIATION SEED  ~ 2015   NM MYOCAR PERF WALL MOTION  09/12/2006   no ischemia   RIGHT/LEFT HEART CATH AND CORONARY/GRAFT ANGIOGRAPHY N/A 06/21/2017   Procedure: RIGHT/LEFT HEART CATH AND CORONARY/GRAFT ANGIOGRAPHY;  Surgeon: Kathleene Hazel, MD;  Location: [facility] INVASIVE CV LAB;  Service: Cardiovascular;  Laterality: N/A;   US ECHOCARDIOGRAPHY  11/04/2008   trace TR & MR    Allergies: Clonidine hcl  Medications: Prior to Admission medications   Medication Sig Start Date End Date Taking? Authorizing Provider  aspirin EC 81 MG tablet Take 81 mg by mouth daily.   Yes [provider]  atorvastatin (LIPITOR) 80 MG tablet Take 1 tablet (80 mg total) by mouth daily. 04/06/22  Yes Hilty, Lisette Abu, MD  Azelastine HCl 0.15 % SOLN Place 1 spray into both nostrils daily as needed (allergies).   Yes [provider]  B Complex-C (B COMPLEX-VITAMIN C) CAPS Take by mouth. 01/06/21  Yes [provider]  Carboxymethylcellulose Sod PF 0.5 % SOLN 1 drop into affected eye as needed   Yes [provider]  carvedilol (COREG) 3.125 MG tablet TAKE 1 TABLET BY MOUTH TWICE A DAY WITH FOOD 03/13/22  Yes Hilty, Lisette Abu, MD  furosemide (LASIX) 80 MG tablet TAKE 1 TABLET (80 MG TOTAL) BY MOUTH 2 (TWO) TIMES DAILY AT 10 AM AND 5 PM. 04/25/22  Yes Hilty, Lisette Abu, MD  gentamicin cream (GARAMYCIN) 0.1 % Apply topically daily. 04/21/21  Yes [provider]  hydrALAZINE (APRESOLINE) 50 MG tablet Take 1 tablet (50 mg total) by mouth in the morning and at bedtime. Please keep scheduled appointment 01/10/22  Yes Hilty, Lisette Abu, MD  insulin glargine (LANTUS) 100 UNIT/ML injection Inject 13 Units into the skin at bedtime. 10/29/19  Yes [provider]  nitroGLYCERIN (NITROSTAT) 0.4 MG SL tablet Place 1 tablet (0.4 mg total) under the tongue every 5 (five) minutes x 3 doses as needed for chest pain. 04/28/19  Yes Hilty, Lisette Abu, MD  OVER THE COUNTER MEDICATION Place 1 spray into the nose daily as needed. Sovereign Silver Nasal Spray for Immune Support   Yes [provider]  Propylene Glycol (SYSTANE COMPLETE) 0.6 % SOLN Place 1 drop into both eyes daily as needed (Dry eye).   Yes [provider]  sevelamer carbonate (RENVELA) 800 MG tablet Take 800 mg by mouth 5 (five) times daily. 11/09/21  Yes [provider]  TRADJENTA 5 MG TABS tablet Take 5 mg by mouth every morning.  02/27/17  Yes [provider]  traZODone (DESYREL) 50 MG tablet Take 50 mg by mouth at bedtime as needed for sleep.  07/12/17  Yes [provider]     Family History  Problem Relation Age of Onset   Heart attack Mother     Social History   Socioeconomic History   Marital status: Married    Spouse name: Olegario Messier   Number of children: Not on file   Years of education: Not on file   Highest education level: Not on file  Occupational History   Occupation: Retired  Tobacco Use   Smoking status: Former    Packs/day: 1.00    Years: 20.00    Additional pack years: 0.00    Total pack  years: 20.00    Types: Cigarettes    Quit date: 08/22/1972    Years since quitting: 49.8   Smokeless tobacco: Never  Vaping Use   Vaping Use: Never used  Substance and Sexual Activity   Alcohol use: Not Currently    Comment: 06/20/2017 "nothing since ~ 1974"   Drug use: No   Sexual activity: Not Currently  Other Topics Concern   Not on file  Social History Narrative   Lives with wife   Right handed   Caffeine- 1 cup some days    Social Determinants of Health   Financial Resource Strain: Not on file  Food  Insecurity: No Food Insecurity (06/22/2022)   Hunger Vital Sign    Worried About Running Out of Food in the Last Year: Never true    Ran Out of Food in the Last Year: Never true  Transportation Needs: No Transportation Needs (06/22/2022)   PRAPARE - Administrator, Civil Service (Medical): No    Lack of Transportation (Non-Medical): No  Physical Activity: Sufficiently Active (10/31/2019)   Exercise Vital Sign    Days of Exercise per Week: 7 days    Minutes of Exercise per Session: 40 min  Stress: Not on file  Social Connections: Not on file     Review of Systems: A 12 point ROS discussed and pertinent positives are indicated in the HPI above.  All other systems are negative.  Vital Signs: BP 130/72 (BP Location: Left Arm)   Pulse 87   Temp 98.7 F (37.1 C) (Oral)   Resp 18   Ht 5\' 11"  (1.803 m)   Wt 185 lb 3 oz (84 kg)   SpO2 96%   BMI 25.83 kg/m    Physical Exam Constitutional:      General: He is not in acute distress.    Appearance: He is well-developed. He is not ill-appearing.  HENT:     Mouth/Throat:     Mouth: Mucous membranes are moist.     Pharynx: Oropharynx is clear.  Cardiovascular:     Rate and Rhythm: Normal rate.     Heart sounds: Murmur heard.  Pulmonary:     Effort: Pulmonary effort is normal.     Breath sounds: Normal breath sounds.  Abdominal:     General: Abdomen is flat. Bowel sounds are normal.     Palpations: Abdomen is  soft.  Skin:    General: Skin is warm and dry.     Coloration: Skin is not cyanotic or jaundiced.  Psychiatric:        Mood and Affect: Mood normal. Mood is not anxious.        Behavior: Behavior normal.     MD Evaluation Airway: WNL Heart: WNL Abdomen: WNL Chest/ Lungs: WNL ASA  Classification: 3 Mallampati/Airway Score: Two  Imaging: DG Abd Portable 1V  Result Date: 06/24/2022 CLINICAL DATA:  Problem with dialysis catheter. EXAM: PORTABLE ABDOMEN - 1 VIEW COMPARISON:  None Available. FINDINGS: The bowel gas pattern is normal. Dialysis catheter projects in the right abdomen, in similar position to April 25th 2024. IMPRESSION: Dialysis catheter projects in the right abdomen, in similar position to April 25th 2024. Electronically Signed   By: Ted Mcalpine M.D.   On: 06/24/2022 16:22   CT ABDOMEN PELVIS WO CONTRAST  Result Date: 06/23/2022 CLINICAL DATA:  Peritoneal dialysis catheter dysfunction EXAM: CT ABDOMEN AND PELVIS WITHOUT CONTRAST TECHNIQUE: Multidetector CT imaging of the abdomen and pelvis was performed following the standard protocol without IV contrast. RADIATION DOSE REDUCTION: This exam was performed according to the departmental dose-optimization program which includes automated exposure control, adjustment of the mA and/or kV according to patient size and/or use of iterative reconstruction technique. COMPARISON:  None Available. FINDINGS: Lower chest: Small right pleural effusion. Right base atelectasis. Coronary artery and aortic calcifications. Hepatobiliary: No focal hepatic abnormality. Gallbladder unremarkable. Pancreas: No focal abnormality or ductal dilatation. Spleen: No focal abnormality.  Normal size. Adrenals/Urinary Tract: No adrenal abnormality. No focal renal abnormality. No stones or hydronephrosis. Urinary bladder is unremarkable. Stomach/Bowel: Large stool burden within the colon. Stomach and small bowel decompressed. No bowel obstruction.  Vascular/Lymphatic:  Aortic atherosclerosis. No evidence of aneurysm or adenopathy. Reproductive: Radiation seeds in the region of the prostate. Other: Moderate free fluid throughout the abdomen and pelvis. Scattered locules of free air predominantly in the upper abdomen. This is likely related to peritoneal dialysis. At peritoneal dialysis catheter is located in the anterior right lower quadrant adjacent to small bowel loops. Musculoskeletal: No acute bony abnormality. IMPRESSION: Peritoneal dialysis catheter in the anterior right lower quadrant adjacent to small bowel loops. Moderate free fluid and scattered free air, likely related to peritoneal dialysis. Small right pleural effusion.  Right base atelectasis. Large stool burden. Aortic atherosclerosis. Electronically Signed   By: Charlett Nose M.D.   On: 06/23/2022 01:01   DG Abd Acute W/Chest  Result Date: 06/22/2022 CLINICAL DATA:  Abdominal pain EXAM: DG ABDOMEN ACUTE WITH 1 VIEW CHEST COMPARISON:  04/18/2019 FINDINGS: Prior CABG. Cardiomegaly, vascular congestion. Interstitial prominence and patchy right mid and lower lung airspace disease. No visible significant effusion. Nonobstructive bowel gas pattern. No organomegaly or free air. Aortoiliac atherosclerosis. Radiation seeds in the region of the prostate. IMPRESSION: Cardiomegaly with vascular congestion. Interstitial prominence bilaterally with patchy right mid and lower lung airspace opacities. Findings are similar to prior study. This could reflect chronic lung disease although this could reflect recurrent CHF and/or pneumonia. No acute findings in the abdomen. Electronically Signed   By: Charlett Nose M.D.   On: 06/22/2022 18:28    Labs:  CBC: Recent Labs    07/05/21 1045 06/22/22 1507 06/24/22 1006 06/25/22 1029  WBC 7.0 8.2 11.5* 12.5*  HGB 12.3* 9.6* 9.0* 9.3*  HCT 35.9* 31.9* 28.4* 29.5*  PLT 192 325 313 360    COAGS: No results for input(s): "INR", "APTT" in the last 8760  hours.  BMP: Recent Labs    06/22/22 1507 06/23/22 1131 06/24/22 1006 06/25/22 1029  NA 141 143 142 146*  K 3.2* 3.0* 3.1* 3.5  CL 107 111 110 110  CO2 25 23 23 24   GLUCOSE 353* 216* 232* 105*  BUN 56* 50* 50* 50*  CALCIUM 9.5 9.5 9.3 9.4  CREATININE 3.99* 3.85* 3.78* 4.12*  GFRNONAA 14* 15* 15* 13*    LIVER FUNCTION TESTS: Recent Labs    07/05/21 1045 06/22/22 1507 06/23/22 1131 06/24/22 1006 06/25/22 1029  BILITOT 0.4 0.6 0.8  --   --   AST 31 21 29   --   --   ALT 21 15 15   --   --   ALKPHOS 59 51 53  --   --   PROT 6.3 7.0 7.4  --   --   ALBUMIN 3.8 2.6* 2.7* 2.4* 2.5*    TUMOR MARKERS: No results for input(s): "AFPTM", "CEA", "CA199", "CHROMGRNA" in the last 8760 hours.  Assessment and Plan: 89 y.o. male with ESRD on RRT via PD who is in need of HD access due to malfunctioning PD.   Patient has been afebrile but WBC trending up.  No s/s on infection on exam, lung clear and abd not tender.  Spoke with nephrology, patient is in need of HD tomorrow but has no functioning access.  Informed nephrology that if WBC continue to increase, IR will have to place a temp cath tomorrow.   The procedure is tentatively scheduled for tomorrow pending IR schedule.   Risks and benefits discussed with the patient's spouse including, but not limited to bleeding, infection, vascular injury, pneumothorax which may require chest tube placement, air embolism or even death  All of the spouse's  questions were answered, she is agreeable to proceed.  Consent obtained for both temp and perm cath from spouse, in IR.   NPO at [facility] CBC with diff ordered for AM   Thank you for this interesting consult.  I greatly enjoyed meeting [name] and look forward to participating in their care.  A copy of this report was sent to the requesting provider on this date.  Electronically Signed: Willette Brace, PA-C 06/25/2022, 2:02 PM   I spent a total of 20 Minutes    in face to face in  clinical consultation, greater than 50% of which was counseling/coordinating care for [facility] placement.   This chart was dictated using voice recognition software.  Despite best efforts to proofread,  errors can occur which can change the documentation meaning.

## 2022-06-25 NOTE — Progress Notes (Signed)
Connected patient to drainage bag ,no initial drained out.Placed patient on several positions in bed,reclined,supine,side to side,standing up,sitting,still no fluid coming out.Renal P.A at the bedside.Noticed soiled dressing.Dressing changed done.

## 2022-06-25 NOTE — Progress Notes (Signed)
Previous shift gave [name] his ordered anema. No visual confirmation of stool but patient stated he was able to use the restroom. Contacted Hemodialysis for PD. Spoke to Obas. He said he will come as soon as he is available.

## 2022-06-26 ENCOUNTER — Inpatient Hospital Stay (HOSPITAL_COMMUNITY): Payer: Medicare Other

## 2022-06-26 DIAGNOSIS — T85611A Breakdown (mechanical) of intraperitoneal dialysis catheter, initial encounter: Secondary | ICD-10-CM | POA: Diagnosis not present

## 2022-06-26 DIAGNOSIS — N185 Chronic kidney disease, stage 5: Secondary | ICD-10-CM

## 2022-06-26 DIAGNOSIS — T82898A Other specified complication of vascular prosthetic devices, implants and grafts, initial encounter: Secondary | ICD-10-CM

## 2022-06-26 HISTORY — PX: IR FLUORO GUIDE CV LINE RIGHT: IMG2283

## 2022-06-26 HISTORY — PX: IR US GUIDE VASC ACCESS RIGHT: IMG2390

## 2022-06-26 LAB — CBC WITH DIFFERENTIAL/PLATELET
Abs Immature Granulocytes: 0.04 10*3/uL (ref 0.00–0.07)
Basophils Absolute: 0 10*3/uL (ref 0.0–0.1)
Basophils Relative: 0 %
Eosinophils Absolute: 0.3 10*3/uL (ref 0.0–0.5)
Eosinophils Relative: 4 %
HCT: 26.9 % — ABNORMAL LOW (ref 39.0–52.0)
Hemoglobin: 8.2 g/dL — ABNORMAL LOW (ref 13.0–17.0)
Immature Granulocytes: 0 %
Lymphocytes Relative: 10 %
Lymphs Abs: 0.9 10*3/uL (ref 0.7–4.0)
MCH: 29.1 pg (ref 26.0–34.0)
MCHC: 30.5 g/dL (ref 30.0–36.0)
MCV: 95.4 fL (ref 80.0–100.0)
Monocytes Absolute: 0.9 10*3/uL (ref 0.1–1.0)
Monocytes Relative: 10 %
Neutro Abs: 7 10*3/uL (ref 1.7–7.7)
Neutrophils Relative %: 76 %
Platelets: 291 10*3/uL (ref 150–400)
RBC: 2.82 MIL/uL — ABNORMAL LOW (ref 4.22–5.81)
RDW: 16.4 % — ABNORMAL HIGH (ref 11.5–15.5)
WBC: 9.3 10*3/uL (ref 4.0–10.5)
nRBC: 1.4 % — ABNORMAL HIGH (ref 0.0–0.2)

## 2022-06-26 LAB — GLUCOSE, CAPILLARY
Glucose-Capillary: 55 mg/dL — ABNORMAL LOW (ref 70–99)
Glucose-Capillary: 104 mg/dL — ABNORMAL HIGH (ref 70–99)
Glucose-Capillary: 87 mg/dL (ref 70–99)
Glucose-Capillary: 93 mg/dL (ref 70–99)
Glucose-Capillary: 102 mg/dL — ABNORMAL HIGH (ref 70–99)

## 2022-06-26 LAB — RENAL FUNCTION PANEL
Albumin: 2 g/dL — ABNORMAL LOW (ref 3.5–5.0)
Anion gap: 6 (ref 5–15)
BUN: 42 mg/dL — ABNORMAL HIGH (ref 8–23)
CO2: 25 mmol/L (ref 22–32)
Calcium: 8.3 mg/dL — ABNORMAL LOW (ref 8.9–10.3)
Chloride: 112 mmol/L — ABNORMAL HIGH (ref 98–111)
Creatinine, Ser: 3.7 mg/dL — ABNORMAL HIGH (ref 0.61–1.24)
GFR, Estimated: 15 mL/min — ABNORMAL LOW (ref >60)
Glucose, Bld: 117 mg/dL — ABNORMAL HIGH (ref 70–99)
Phosphorus: 1.4 mg/dL — ABNORMAL LOW (ref 2.5–4.6)
Potassium: 3.1 mmol/L — ABNORMAL LOW (ref 3.5–5.1)
Sodium: 143 mmol/L (ref 135–145)

## 2022-06-26 LAB — CBC
HCT: 26.2 % — ABNORMAL LOW (ref 39.0–52.0)
Hemoglobin: 8.1 g/dL — ABNORMAL LOW (ref 13.0–17.0)
MCH: 29.7 pg (ref 26.0–34.0)
MCHC: 30.9 g/dL (ref 30.0–36.0)
MCV: 96 fL (ref 80.0–100.0)
Platelets: 272 10*3/uL (ref 150–400)
RBC: 2.73 MIL/uL — ABNORMAL LOW (ref 4.22–5.81)
RDW: 16.5 % — ABNORMAL HIGH (ref 11.5–15.5)
WBC: 8.8 10*3/uL (ref 4.0–10.5)
nRBC: 1.4 % — ABNORMAL HIGH (ref 0.0–0.2)

## 2022-06-26 MED ORDER — HEPARIN SODIUM (PORCINE) 1000 UNIT/ML IJ SOLN
INTRAMUSCULAR | Status: AC
  Filled 2022-06-26: qty 10

## 2022-06-26 MED ORDER — ALTEPLASE 2 MG IJ SOLR: 2.0000 mg | Freq: Once | INTRAMUSCULAR | Status: DC | PRN

## 2022-06-26 MED ORDER — CHLORHEXIDINE GLUCONATE CLOTH 2 % EX PADS
6.0000 | MEDICATED_PAD | Freq: Every day | CUTANEOUS | Status: DC
  Administered 2022-06-26 – 2022-06-28 (×3): 6 via TOPICAL

## 2022-06-26 MED ORDER — CEFAZOLIN SODIUM-DEXTROSE 2-4 GM/100ML-% IV SOLN
INTRAVENOUS | Status: AC | PRN
  Administered 2022-06-26: 2 g via INTRAVENOUS

## 2022-06-26 MED ORDER — HEPARIN SODIUM (PORCINE) 1000 UNIT/ML DIALYSIS: 1000.0000 [IU] | INTRAMUSCULAR | Status: DC | PRN

## 2022-06-26 MED ORDER — MIDAZOLAM HCL 2 MG/2ML IJ SOLN
INTRAMUSCULAR | Status: AC | PRN
  Administered 2022-06-26: .5 mg via INTRAVENOUS

## 2022-06-26 MED ORDER — FENTANYL CITRATE (PF) 100 MCG/2ML IJ SOLN
INTRAMUSCULAR | Status: AC
  Filled 2022-06-26: qty 2

## 2022-06-26 MED ORDER — HEPARIN SODIUM (PORCINE) 1000 UNIT/ML IJ SOLN
10.0000 mL | Freq: Once | INTRAMUSCULAR | Status: AC
  Administered 2022-06-26: 3.2 mL via INTRAVENOUS

## 2022-06-26 MED ORDER — ANTICOAGULANT SODIUM CITRATE 4% (200MG/5ML) IV SOLN: 5.0000 mL | Status: DC | PRN

## 2022-06-26 MED ORDER — LIDOCAINE-EPINEPHRINE (PF) 2 %-1:200000 IJ SOLN
20.0000 mL | Freq: Once | INTRAMUSCULAR | Status: DC
  Filled 2022-06-26: qty 20

## 2022-06-26 MED ORDER — LIDOCAINE-PRILOCAINE 2.5-2.5 % EX CREA: 1.0000 | TOPICAL_CREAM | CUTANEOUS | Status: DC | PRN

## 2022-06-26 MED ORDER — PENTAFLUOROPROP-TETRAFLUOROETH EX AERO: 1.0000 | INHALATION_SPRAY | CUTANEOUS | Status: DC | PRN

## 2022-06-26 MED ORDER — MIDAZOLAM HCL 2 MG/2ML IJ SOLN
INTRAMUSCULAR | Status: AC
  Filled 2022-06-26: qty 2

## 2022-06-26 MED ORDER — LIDOCAINE-EPINEPHRINE 1 %-1:100000 IJ SOLN
20.0000 mL | Freq: Once | INTRAMUSCULAR | Status: AC
  Administered 2022-06-26: 20 mL

## 2022-06-26 MED ORDER — DEXTROSE 50 % IV SOLN
INTRAVENOUS | Status: AC
  Administered 2022-06-26: 50 mL
  Filled 2022-06-26: qty 50

## 2022-06-26 MED ORDER — LIDOCAINE HCL (PF) 1 % IJ SOLN: 5.0000 mL | INTRAMUSCULAR | Status: DC | PRN

## 2022-06-26 MED ORDER — LIDOCAINE-EPINEPHRINE 1 %-1:100000 IJ SOLN
INTRAMUSCULAR | Status: AC
  Filled 2022-06-26: qty 1

## 2022-06-26 MED ORDER — DEXTROSE 50 % IV SOLN: 12.5000 g | INTRAVENOUS | Status: AC

## 2022-06-26 MED ORDER — FENTANYL CITRATE (PF) 100 MCG/2ML IJ SOLN
INTRAMUSCULAR | Status: AC | PRN
  Administered 2022-06-26: 25 ug via INTRAVENOUS

## 2022-06-26 MED ORDER — CEFAZOLIN SODIUM-DEXTROSE 2-4 GM/100ML-% IV SOLN
INTRAVENOUS | Status: AC
  Filled 2022-06-26: qty 100

## 2022-06-26 NOTE — Procedures (Signed)
Interventional Radiology Procedure Note  Procedure: Tunneled hemodialysis catheter placement  Findings: Please refer to procedural dictation for full description. 19 cm tunneled HD, tip in right atrium.  Complications: None immediate  Estimated Blood Loss: < 5 ml  Recommendations: Catheter ready for immediate use.   Marliss Coots, MD

## 2022-06-26 NOTE — Care Management Important Message (Signed)
Important Message  Patient Details  Name: [name] MRN: [id number] Date of Birth: 1933/04/15   Medicare Important Message Given:  Yes     Sreenidhi Ganson 06/26/2022, 3:30 PM

## 2022-06-26 NOTE — Progress Notes (Signed)
Hypoglycemic Event  CBG: 55  Treatment: D50 25 mL (12.5 gm)  Symptoms: None  Follow-up CBG: Time:0745 CBG Result:103  Possible Reasons for Event: Other: npo  Comments/MD notified:Pahwani Carole Binning

## 2022-06-26 NOTE — Progress Notes (Signed)
PROGRESS NOTE    [name]  ZOX:[id number] DOB: 12-Mar-1933 DOA: 06/22/2022 PCP: Linus Galas, NP   Brief Narrative:  89 year old African-American male history of end-stage renal disease on peritoneal dialysis for last 3 years, moderate aortic stenosis by echocardiogram, type 2 diabetes, coronary disease status post CABG, hypertension, mild cognitive impairment who presented the ER with issues regarding his peritoneal dialysis. it appears that the patient and his wife have not been able to manage his peritoneal dialysis at home.  Wife states over the last month and a half, the patient's had issues with his peritoneal dialysis.  Patient states that he will hang a bag of dialysate fluid on his home cycler.  But when he wakes up in the morning, the bag of dialysate fluid is full and the waste fluid bag is empty.  Wife has noticed the patient having lower extreme edema for the last week.   Patient was sent to the dialysis center, per the note from Dr. Signe Colt, and had his peritoneal cavity filled with dialysis fluid.  They had trouble draining the fluid from the cycler or by manual drainage.  Patient was sent to the ER by Dr. Signe Colt.  Assessment & Plan:   Principal Problem:   Peritoneal dialysis catheter dysfunction (HCC) Active Problems:   Moderate aortic stenosis by prior echocardiogram - mean gradient 24.3 mmHg. peak gradient 42.9 mmHg. Aortic valve area, by VTI measures 1.15  cm.   Essential hypertension   DM2 (diabetes mellitus, type 2) (HCC)   Hypokalemia   ESRD on peritoneal dialysis (HCC)   DNR (do not resuscitate)  ESRD on PD/peritoneal dialysis catheter dysfunction (HCC) CT abdomen shows heavy burden of stool/constipation.  Nephrology on board and opined that he is PD catheter tip appears to be in pelvis and his constipation could be the reason for occlusion.  Patient was started on bowel prep, he had several bowel movements but catheter did not work.  Eventually patient received  temporary dialysis catheter today, plan per nephrology is to do hemodialysis and try to fix the PD catheter.  Appreciate nephrology help.   Moderate aortic stenosis by prior echocardiogram - mean gradient 24.3 mmHg. peak gradient 42.9 mmHg. Aortic valve area, by VTI measures 1.15  cm. Chronic. May need to watch out for hypotension during dialysis.   Hypokalemia Resolved.  Per nephrology.   DM2 (diabetes mellitus, type 2) (HCC) Currently on Tradjenta and Semglee 10 units and SSI.  Blood sugar fairly controlled.     Essential hypertension Stable.  Continue current home medications.  DVT prophylaxis: SCDs Start: 06/22/22 2100   Code Status: DNR  Family Communication:  None present at bedside.  Plan of care discussed with the wife last evening.  Have requested nephrology to give her a call as well.  Status is: Inpatient Remains inpatient appropriate because: PD is still not working.    Estimated body mass index is 25.83 kg/m as calculated from the following:   Height as of this encounter: 5\' 11"  (1.803 m).   Weight as of this encounter: 84 kg.    Nutritional Assessment: Body mass index is 25.83 kg/m.Marland Kitchen Seen by dietician.  I agree with the assessment and plan as outlined below: Nutrition Status:        . Skin Assessment: I have examined the patient's skin and I agree with the wound assessment as performed by the wound care RN as outlined below:    Consultants:  Nephrology  Procedures:  None  Antimicrobials:  Anti-infectives (From  admission, onward)    Start     Dose/Rate Route Frequency Ordered Stop   06/26/22 0810  ceFAZolin (ANCEF) IVPB 2g/100 mL premix        over 30 Minutes Intravenous Continuous PRN 06/26/22 1610 06/26/22 0830         Subjective: Patient seen and examined.  He has no complaints.  Objective: Vitals:   06/26/22 0820 06/26/22 0825 06/26/22 0835 06/26/22 0912  BP: (!) 148/89 134/75 129/64 133/65  Pulse: 78 81 80 76  Resp: 18 17 15 16    Temp:    97.6 F (36.4 C)  TempSrc:    Oral  SpO2: 100% 100% 93% 92%  Weight:      Height:        Intake/Output Summary (Last 24 hours) at 06/26/2022 1034 Last data filed at 06/26/2022 0830 Gross per 24 hour  Intake 639.94 ml  Output 150 ml  Net 489.94 ml    Filed Weights   06/22/22 1458 06/22/22 2103 06/24/22 0500  Weight: 76.4 kg 83.4 kg 84 kg    Examination:  General exam: Appears calm and comfortable  Respiratory system: Clear to auscultation. Respiratory effort normal. Cardiovascular system: S1 & S2 heard, RRR. No JVD, murmurs, rubs, gallops or clicks. No pedal edema. Gastrointestinal system: Abdomen is nondistended, soft and nontender. No organomegaly or masses felt. Normal bowel sounds heard. Central nervous system: Alert and oriented x 3. No focal neurological deficits. Extremities: Symmetric 5 x 5 power. Skin: No rashes, lesions or ulcers.  Psychiatry: Judgement and insight appear poor.  Data Reviewed: I have personally reviewed following labs and imaging studies  CBC: Recent Labs  Lab 06/22/22 1507 06/24/22 1006 06/25/22 1029 06/26/22 0317  WBC 8.2 11.5* 12.5* 9.3  NEUTROABS 6.1  --   --  7.0  HGB 9.6* 9.0* 9.3* 8.2*  HCT 31.9* 28.4* 29.5* 26.9*  MCV 96.4 93.1 94.6 95.4  PLT 325 313 360 291    Basic Metabolic Panel: Recent Labs  Lab 06/22/22 1507 06/23/22 1131 06/24/22 1006 06/25/22 1029  NA 141 143 142 146*  K 3.2* 3.0* 3.1* 3.5  CL 107 111 110 110  CO2 25 23 23 24   GLUCOSE 353* 216* 232* 105*  BUN 56* 50* 50* 50*  CREATININE 3.99* 3.85* 3.78* 4.12*  CALCIUM 9.5 9.5 9.3 9.4  MG  --  2.0  --   --   PHOS  --   --  1.8* 1.6*    GFR: Estimated Creatinine Clearance: 13.7 mL/min (A) (by C-G formula based on SCr of 4.12 mg/dL (H)). Liver Function Tests: Recent Labs  Lab 06/22/22 1507 06/23/22 1131 06/24/22 1006 06/25/22 1029  AST 21 29  --   --   ALT 15 15  --   --   ALKPHOS 51 53  --   --   BILITOT 0.6 0.8  --   --   PROT 7.0 7.4   --   --   ALBUMIN 2.6* 2.7* 2.4* 2.5*    Recent Labs  Lab 06/22/22 1507  LIPASE 37    No results for input(s): "AMMONIA" in the last 168 hours. Coagulation Profile: No results for input(s): "INR", "PROTIME" in the last 168 hours. Cardiac Enzymes: No results for input(s): "CKTOTAL", "CKMB", "CKMBINDEX", "TROPONINI" in the last 168 hours. BNP (last 3 results) No results for input(s): "PROBNP" in the last 8760 hours. HbA1C: No results for input(s): "HGBA1C" in the last 72 hours.  CBG: Recent Labs  Lab 06/25/22 1128 06/25/22 1633  06/25/22 2046 06/26/22 0708 06/26/22 0746  GLUCAP 140* 115* 106* 55* 104*    Lipid Profile: No results for input(s): "CHOL", "HDL", "LDLCALC", "TRIG", "CHOLHDL", "LDLDIRECT" in the last 72 hours. Thyroid Function Tests: No results for input(s): "TSH", "T4TOTAL", "FREET4", "T3FREE", "THYROIDAB" in the last 72 hours. Anemia Panel: Recent Labs    06/24/22 1006  TIBC 244*  IRON 21*    Sepsis Labs: No results for input(s): "PROCALCITON", "LATICACIDVEN" in the last 168 hours.  No results found for this or any previous visit (from the past 240 hour(s)).   Radiology Studies: IR Fluoro Guide CV Line Right  Result Date: 06/26/2022 INDICATION: 89 year old male history of end-stage renal disease requiring tunneled hemodialysis catheter placement due to peritoneal dialysis catheter malfunction. EXAM: TUNNELED CENTRAL VENOUS HEMODIALYSIS CATHETER PLACEMENT WITH ULTRASOUND AND FLUOROSCOPIC GUIDANCE MEDICATIONS: Ancef 2 gm IV . The antibiotic was given in an appropriate time interval prior to skin puncture. ANESTHESIA/SEDATION: Moderate (conscious) sedation was employed during this procedure. A total of Versed 0.5 mg and Fentanyl 25 mcg was administered intravenously. Moderate Sedation Time: 10 minutes. The patient's level of consciousness and vital signs were monitored continuously by radiology nursing throughout the procedure under my direct supervision.  FLUOROSCOPY TIME:  One mGy COMPLICATIONS: None immediate. PROCEDURE: Informed written consent was obtained from the patient after a discussion of the risks, benefits, and alternatives to treatment. Questions regarding the procedure were encouraged and answered. The right neck and chest were prepped with chlorhexidine in a sterile fashion, and a sterile drape was applied covering the operative field. Maximum barrier sterile technique with sterile gowns and gloves were used for the procedure. A timeout was performed prior to the initiation of the procedure. After creating a small venotomy incision, a 21 gauge micropuncture kit was utilized to access the internal jugular vein. Real-time ultrasound guidance was utilized for vascular access including the acquisition of a permanent ultrasound image documenting patency of the accessed vessel. A Rosen wire was advanced to the level of the IVC and the micropuncture sheath was exchanged for an 8 Fr dilator. A 14.5 French tunneled hemodialysis catheter measuring 19 cm from tip to cuff was tunneled in a retrograde fashion from the anterior chest wall to the venotomy incision. Serial dilation was then performed an a peel-away sheath was placed. The catheter was then placed through the peel-away sheath with the catheter tip ultimately positioned within the right atrium. Final catheter positioning was confirmed and documented with a spot radiographic image. The catheter aspirates and flushes normally. The catheter was flushed with appropriate volume heparin dwells. The catheter exit site was secured with a 0-Silk retention suture. The venotomy incision was closed with Dermabond. Sterile dressings were applied. The patient tolerated the procedure well without immediate post procedural complication. IMPRESSION: Successful placement of 19 cm tip to cuff tunneled hemodialysis catheter via the right internal jugular vein with catheter tip terminating within the right atrium. The  catheter is ready for immediate use. Marliss Coots, MD [facility] Radiology Electronically Signed   By: Marliss Coots M.D.   On: 06/26/2022 09:25   IR US Guide Vasc Access Right  Result Date: 06/26/2022 INDICATION: 89 year old male history of end-stage renal disease requiring tunneled hemodialysis catheter placement due to peritoneal dialysis catheter malfunction. EXAM: TUNNELED CENTRAL VENOUS HEMODIALYSIS CATHETER PLACEMENT WITH ULTRASOUND AND FLUOROSCOPIC GUIDANCE MEDICATIONS: Ancef 2 gm IV . The antibiotic was given in an appropriate time interval prior to skin puncture. ANESTHESIA/SEDATION: Moderate (conscious) sedation was employed  during this procedure. A total of Versed 0.5 mg and Fentanyl 25 mcg was administered intravenously. Moderate Sedation Time: 10 minutes. The patient's level of consciousness and vital signs were monitored continuously by radiology nursing throughout the procedure under my direct supervision. FLUOROSCOPY TIME:  One mGy COMPLICATIONS: None immediate. PROCEDURE: Informed written consent was obtained from the patient after a discussion of the risks, benefits, and alternatives to treatment. Questions regarding the procedure were encouraged and answered. The right neck and chest were prepped with chlorhexidine in a sterile fashion, and a sterile drape was applied covering the operative field. Maximum barrier sterile technique with sterile gowns and gloves were used for the procedure. A timeout was performed prior to the initiation of the procedure. After creating a small venotomy incision, a 21 gauge micropuncture kit was utilized to access the internal jugular vein. Real-time ultrasound guidance was utilized for vascular access including the acquisition of a permanent ultrasound image documenting patency of the accessed vessel. A Rosen wire was advanced to the level of the IVC and the micropuncture sheath was exchanged for an 8 Fr  dilator. A 14.5 French tunneled hemodialysis catheter measuring 19 cm from tip to cuff was tunneled in a retrograde fashion from the anterior chest wall to the venotomy incision. Serial dilation was then performed an a peel-away sheath was placed. The catheter was then placed through the peel-away sheath with the catheter tip ultimately positioned within the right atrium. Final catheter positioning was confirmed and documented with a spot radiographic image. The catheter aspirates and flushes normally. The catheter was flushed with appropriate volume heparin dwells. The catheter exit site was secured with a 0-Silk retention suture. The venotomy incision was closed with Dermabond. Sterile dressings were applied. The patient tolerated the procedure well without immediate post procedural complication. IMPRESSION: Successful placement of 19 cm tip to cuff tunneled hemodialysis catheter via the right internal jugular vein with catheter tip terminating within the right atrium. The catheter is ready for immediate use. Marliss Coots, MD [facility] Radiology Electronically Signed   By: Marliss Coots M.D.   On: 06/26/2022 09:25   DG Abd Portable 1V  Result Date: 06/24/2022 CLINICAL DATA:  Problem with dialysis catheter. EXAM: PORTABLE ABDOMEN - 1 VIEW COMPARISON:  None Available. FINDINGS: The bowel gas pattern is normal. Dialysis catheter projects in the right abdomen, in similar position to April 25th 2024. IMPRESSION: Dialysis catheter projects in the right abdomen, in similar position to April 25th 2024. Electronically Signed   By: Ted Mcalpine M.D.   On: 06/24/2022 16:22    Scheduled Meds:  atorvastatin  80 mg Oral Daily   carvedilol  3.125 mg Oral BID WC   dextrose  12.5 g Intravenous STAT   ferric citrate  420 mg Oral TID WC   gentamicin cream  1 Application Topical Daily   hydrALAZINE  50 mg Oral Q12H   insulin aspart  0-5 Units Subcutaneous QHS    insulin aspart  0-6 Units Subcutaneous TID WC   insulin glargine-yfgn  8 Units Subcutaneous BID   isosorbide mononitrate  30 mg Oral Daily   lactulose  30 g Oral Daily   lidocaine-EPINEPHrine  20 mL Infiltration Once   linagliptin  5 mg Oral Daily   pantoprazole (PROTONIX) IV  40 mg Intravenous Q24H   polyethylene glycol  17 g Oral BID   Continuous Infusions:  dialysis solution 2.5% low-MG/low-CA Stopped (06/25/22 1803)   ferric gluconate (FERRLECIT) IVPB 250 mg (06/26/22  1010)     LOS: 3 days   Hughie Closs, MD [facility]  06/26/2022, 10:34 AM   *Please note that this is a verbal dictation therefore any spelling or grammatical errors are due to the "Dragon Medical One" system interpretation.  Please page via Amion and do not message via secure chat for urgent patient care matters. Secure chat can be used for non urgent patient care matters.  How to contact the [facility] Attending or Consulting provider 7A - 7P or covering provider during after hours 7P -7A, for this patient?  Check the care team in [facility] and look for a) attending/consulting TRH provider listed and b) the [facility] team listed. Page or secure chat 7A-7P. Log into [email or web address] and use [facility] universal password to access. If you do not have the password, please contact the hospital operator. Locate the [facility] provider you are looking for under [facility] and page to a number that you can be directly reached. If you still have difficulty reaching the provider, please page the [facility] (Director on Call) for the Hospitalists listed on amion for assistance.

## 2022-06-26 NOTE — Inpatient Diabetes Management (Signed)
Inpatient Diabetes Program Recommendations  AACE/ADA: New Consensus Statement on Inpatient Glycemic Control (2015)  Target Ranges:  Prepandial:   less than 140 mg/dL      Peak postprandial:   less than 180 mg/dL (1-2 hours)      Critically ill patients:  140 - 180 mg/dL   Lab Results  Component Value Date   GLUCAP 87 06/26/2022   HGBA1C 8.0 (H) 06/22/2022    Review of Glycemic Control  Latest Reference Range & Units 06/26/22 07:08 06/26/22 07:46 06/26/22 11:22  Glucose-Capillary 70 - 99 mg/dL 55 (L) 098 (H) 87  (L): Data is abnormally low (H): Data is abnormally high  Diabetes history: DM2 Outpatient Diabetes medications: Lantus 13 units QD, Tradjenta 5 mg QD Current orders for Inpatient glycemic control: Semglee 8 units BID, Novolog 0-6 units TID and 0-5 units QHS, Tradjenta 5 mg QD  Inpatient Diabetes Program Recommendations:    Semglee 6 units BID  Will continue to follow while inpatient.  Thank you, Dulce Sellar, MSN, CDCES Diabetes Coordinator Inpatient Diabetes Program [phone] (team pager from 8a-5p)

## 2022-06-26 NOTE — Progress Notes (Signed)
   06/26/22 1810  Vitals  Temp 97.7 F (36.5 C)  Pulse Rate 73  Resp (!) 26  BP (!) 170/82  SpO2 95 %  Post Treatment  Dialyzer Clearance Clear  Duration of HD Treatment -hour(s) 2 hour(s)  Hemodialysis Intake (mL) 0 mL  Liters Processed 24  Fluid Removed (mL) 1000 mL  Tolerated HD Treatment Yes   Received patient in bed to unit.  Alert and oriented.  Informed consent signed and in chart.   TX duration:2hrs  Patient tolerated well.  Transported back to the room  Alert, without acute distress.  Hand-off given to patient's nurse.   Access used: [facility] Access issues: none  Total UF removed: 1L Medication(s) given: none    [name] [name]

## 2022-06-26 NOTE — Progress Notes (Signed)
[facility] Progress Note   Subjective:   TDC placed this AM. Pt denies SOB, CP, dizziness, nausea. Wants to eat breakfast. Will have HD today.  Objective Vitals:   06/26/22 0815 06/26/22 0820 06/26/22 0825 06/26/22 0835  BP: (!) 154/68 (!) 148/89 134/75 129/64  Pulse: 81 78 81 80  Resp: 18 18 17 15   Temp:      TempSrc:      SpO2: 99% 100% 100% 93%  Weight:      Height:       Physical Exam General: Alert male in NAD Heart: RRR, 3/6 systolic murmur, no rubs or gallops Lungs:  CTA bilaterally, on RA Abdomen: soft, moderately distended, no TTP. PD cath in lower abdomen Extremities: 2+ pitting edema b/l lower extremities Dialysis Access: R IJ [facility]   Additional Objective Labs: Basic Metabolic Panel: Recent Labs  Lab 06/23/22 1131 06/24/22 1006 06/25/22 1029  NA 143 142 146*  K 3.0* 3.1* 3.5  CL 111 110 110  CO2 23 23 24   GLUCOSE 216* 232* 105*  BUN 50* 50* 50*  CREATININE 3.85* 3.78* 4.12*  CALCIUM 9.5 9.3 9.4  PHOS  --  1.8* 1.6*   Liver Function Tests: Recent Labs  Lab 06/22/22 1507 06/23/22 1131 06/24/22 1006 06/25/22 1029  AST 21 29  --   --   ALT 15 15  --   --   ALKPHOS 51 53  --   --   BILITOT 0.6 0.8  --   --   PROT 7.0 7.4  --   --   ALBUMIN 2.6* 2.7* 2.4* 2.5*   Recent Labs  Lab 06/22/22 1507  LIPASE 37   CBC: Recent Labs  Lab 06/22/22 1507 06/24/22 1006 06/25/22 1029 06/26/22 0317  WBC 8.2 11.5* 12.5* 9.3  NEUTROABS 6.1  --   --  7.0  HGB 9.6* 9.0* 9.3* 8.2*  HCT 31.9* 28.4* 29.5* 26.9*  MCV 96.4 93.1 94.6 95.4  PLT 325 313 360 291   Blood Culture No results found for: "SDES", "SPECREQUEST", "CULT", "REPTSTATUS"  Cardiac Enzymes: No results for input(s): "CKTOTAL", "CKMB", "CKMBINDEX", "TROPONINI" in the last 168 hours. CBG: Recent Labs  Lab 06/25/22 1128 06/25/22 1633 06/25/22 2046 06/26/22 0708 06/26/22 0746  GLUCAP 140* 115* 106* 55* 104*   Iron Studies:  Recent Labs    06/24/22 1006  IRON 21*   TIBC 244*   @lablastinr3 @ Studies/Results: DG Abd Portable 1V  Result Date: 06/24/2022 CLINICAL DATA:  Problem with dialysis catheter. EXAM: PORTABLE ABDOMEN - 1 VIEW COMPARISON:  None Available. FINDINGS: The bowel gas pattern is normal. Dialysis catheter projects in the right abdomen, in similar position to April 25th 2024. IMPRESSION: Dialysis catheter projects in the right abdomen, in similar position to April 25th 2024. Electronically Signed   By: Ted Mcalpine M.D.   On: 06/24/2022 16:22   Medications:  dialysis solution 2.5% low-MG/low-CA Stopped (06/25/22 1803)   ferric gluconate (FERRLECIT) IVPB 250 mg (06/24/22 1416)    atorvastatin  80 mg Oral Daily   carvedilol  3.125 mg Oral BID WC   dextrose  12.5 g Intravenous STAT   ferric citrate  420 mg Oral TID WC   gentamicin cream  1 Application Topical Daily   hydrALAZINE  50 mg Oral Q12H   insulin aspart  0-5 Units Subcutaneous QHS   insulin aspart  0-6 Units Subcutaneous TID WC   insulin glargine-yfgn  8 Units Subcutaneous BID   isosorbide mononitrate  30 mg Oral  Daily   lactulose  30 g Oral Daily   lidocaine-EPINEPHrine  20 mL Infiltration Once   linagliptin  5 mg Oral Daily   pantoprazole (PROTONIX) IV  40 mg Intravenous Q24H   polyethylene glycol  17 g Oral BID    Dialysis Orders: [facility] PD CCPD 5 exchanges 2.5L dwell 1 hr 30 min dwell time No pause, no day dwell  Assessment/Plan: 1 Underdialysis/ PD catheter malfunction/ cognitive decline: Pt and wife's primary goal is to remain at home.  CT showed large stool burden and PD catheter in the anterior right lower quadrant adjacent to small bowel loops. Completed bowel regimen but multiple attempts to drain unsuccessful.   TDC placed by IR today for hopefully temporary HD while determining cause for PD catheter dysfunction. Will consult surgery to see if they have any input.   Per his primary nephrologist "they really don't want to convert to HD and I get it- if that  has to be pursued hopefully it would be just a temporary conversion to optimize him in terms of volume and uremia and then transition back to PD.  He was previously independent with everything and always had perfect gold report cards so this is tough on him and his wife."  [name] is willing to help in whatever way she can to aid in having him remain at home therapies.  She went to a class to retrain today to help assist with PD at home now that his cognitive decline prevents him from doing it all himself.    2 ESRD: pn PD--> see above in #1.  Order placed to [facility] placement by IR with HD to follow.     3. Hypokalemia - supplement given, last K+ 3.5.    4 Hypertension/ volume: overloaded, Ordered lasix to help improve volume. +LE edema, no respiratory distress. UF today with HD as tolerated. BP improved this AM, on carvedilol 3.125mg  BID and hydralazine 50mg  BID   4. Anemia of ESRD: Last Hgb 8.2. tsat 9%, ordered IV iron course.   5. Metabolic Bone Disease: Corrected Ca high, hold calcitriol for now.  Phos low - hold binders.    6.  Dispo: Will need TDC placement with arrangement for temporary OP HD.   Rogers Blocker, PA-C 06/26/2022, 8:50 AM  [facility] Pager: [phone]

## 2022-06-26 NOTE — Consult Note (Addendum)
Hospital Consult    Reason for Consult:  malfunctioning PD Cath Requesting Physician:  Rogers Blocker PA-C MRN #:  [id number]  History of Present Illness: This is a 89 y.o. male with ESRD on Peritoneal dialysis since February of 2021 who presented to ED with issues regarding his PD from dialysis center. Apparently having recurrent issues with the function of the PD over the past month. Patient and his wife want to hopefully continue PD. In discussion with Nephrology PA-C they have attempted multiple attempts to drain dialyzate using PD catheter  and flush without success. TDC was placed by IR today to use in the interim. Of note patient had CT that showed large stool burden. Patient has not had BM since admission despite aggressive bowel regimen. Vascular surgery consulted for evaluation of PD catheter to see if it can be salvaged.   Past Medical History:  Diagnosis Date   Acute coronary syndrome (HCC) 04/18/2019   Aortic stenosis 02/27/2020   mild to moderate AS   Arthritis    "a little in LLE" (06/20/2017)   Ataxia    Chronic diastolic CHF (congestive heart failure) (HCC)    CKD (chronic kidney disease), stage IV (HCC)    Coronary artery disease    a. s/p CABG 1980s. b. stable by cath 05/2017.   Dizziness    Dyslipidemia    Edema 08/08/2017   HANDS & LOWER EXTREMITES   Hypertension    Hypoalbuminemia    Nephrotic range proteinuria    Peripheral artery disease (HCC)    mild   Pneumonia ~ 1950 X 1   Prostate cancer (HCC) ~ 2015   Type II diabetes mellitus (HCC)     Past Surgical History:  Procedure Laterality Date   CARDIAC CATHETERIZATION     "a couple times" (06/20/2017)   CATARACT EXTRACTION W/ INTRAOCULAR LENS  IMPLANT, BILATERAL Bilateral    COLONOSCOPY     CORONARY ARTERY BYPASS GRAFT  03/06/1985   LAD and first diagonal/circumflex (sequential saphenous vein graft,cardioplegia   INGUINAL HERNIA REPAIR Right 12/06/2020   Procedure: OPEN RIGHT INGUINAL HERNIA REPAIR;   Surgeon: Luretha Murphy, MD;  Location: [facility];  Service: General;  Laterality: Right;   INSERTION PROSTATE RADIATION SEED  ~ 2015   IR FLUORO GUIDE CV LINE RIGHT  06/26/2022   IR US GUIDE VASC ACCESS RIGHT  06/26/2022   NM MYOCAR PERF WALL MOTION  09/12/2006   no ischemia   RIGHT/LEFT HEART CATH AND CORONARY/GRAFT ANGIOGRAPHY N/A 06/21/2017   Procedure: RIGHT/LEFT HEART CATH AND CORONARY/GRAFT ANGIOGRAPHY;  Surgeon: Kathleene Hazel, MD;  Location: [facility];  Service: Cardiovascular;  Laterality: N/A;   US ECHOCARDIOGRAPHY  11/04/2008   trace TR & MR    Allergies  Allergen Reactions   Clonidine Hcl     Other reaction(s): constipation    Prior to Admission medications   Medication Sig Start Date End Date Taking? Authorizing Provider  aspirin EC 81 MG tablet Take 81 mg by mouth daily.   Yes [provider]  atorvastatin (LIPITOR) 80 MG tablet Take 1 tablet (80 mg total) by mouth daily. 04/06/22  Yes Hilty, Lisette Abu, MD  Azelastine HCl 0.15 % SOLN Place 1 spray into both nostrils daily as needed (allergies).   Yes [provider]  B Complex-C (B COMPLEX-VITAMIN C) CAPS Take by mouth. 01/06/21  Yes [provider]  Carboxymethylcellulose Sod PF 0.5 % SOLN 1 drop into affected eye as needed   Yes [provider]  carvedilol (COREG) 3.125 MG tablet TAKE 1 TABLET BY MOUTH TWICE A DAY WITH FOOD 03/13/22  Yes Hilty, Lisette Abu, MD  furosemide (LASIX) 80 MG tablet TAKE 1 TABLET (80 MG TOTAL) BY MOUTH 2 (TWO) TIMES DAILY AT 10 AM AND 5 PM. 04/25/22  Yes Hilty, Lisette Abu, MD  gentamicin cream (GARAMYCIN) 0.1 % Apply topically daily. 04/21/21  Yes [provider]  hydrALAZINE (APRESOLINE) 50 MG tablet Take 1 tablet (50 mg total) by mouth in the morning and at bedtime. Please keep scheduled appointment 01/10/22  Yes Hilty, Lisette Abu, MD  insulin glargine (LANTUS) 100 UNIT/ML injection Inject 13 Units into the skin at bedtime. 10/29/19  Yes  [provider]  nitroGLYCERIN (NITROSTAT) 0.4 MG SL tablet Place 1 tablet (0.4 mg total) under the tongue every 5 (five) minutes x 3 doses as needed for chest pain. 04/28/19  Yes Hilty, Lisette Abu, MD  OVER THE COUNTER MEDICATION Place 1 spray into the nose daily as needed. Sovereign Silver Nasal Spray for Immune Support   Yes [provider]  Propylene Glycol (SYSTANE COMPLETE) 0.6 % SOLN Place 1 drop into both eyes daily as needed (Dry eye).   Yes [provider]  sevelamer carbonate (RENVELA) 800 MG tablet Take 800 mg by mouth 5 (five) times daily. 11/09/21  Yes [provider]  TRADJENTA 5 MG TABS tablet Take 5 mg by mouth every morning.  02/27/17  Yes [provider]  traZODone (DESYREL) 50 MG tablet Take 50 mg by mouth at bedtime as needed for sleep.  07/12/17  Yes [provider]    Social History   Socioeconomic History   Marital status: Married    Spouse name: Olegario Messier   Number of children: Not on file   Years of education: Not on file   Highest education level: Not on file  Occupational History   Occupation: Retired  Tobacco Use   Smoking status: Former    Packs/day: 1.00    Years: 20.00    Additional pack years: 0.00    Total pack years: 20.00    Types: Cigarettes    Quit date: 08/22/1972    Years since quitting: 49.8   Smokeless tobacco: Never  Vaping Use   Vaping Use: Never used  Substance and Sexual Activity   Alcohol use: Not Currently    Comment: 06/20/2017 "nothing since ~ 1974"   Drug use: No   Sexual activity: Not Currently  Other Topics Concern   Not on file  Social History Narrative   Lives with wife   Right handed   Caffeine- 1 cup some days    Social Determinants of Health   Financial Resource Strain: Not on file  Food Insecurity: No Food Insecurity (06/22/2022)   Hunger Vital Sign    Worried About Running Out of Food in the Last Year: Never true    Ran Out of Food in the Last Year: Never true   Transportation Needs: No Transportation Needs (06/22/2022)   PRAPARE - Administrator, Civil Service (Medical): No    Lack of Transportation (Non-Medical): No  Physical Activity: Sufficiently Active (10/31/2019)   Exercise Vital Sign    Days of Exercise per Week: 7 days    Minutes of Exercise per Session: 40 min  Stress: Not on file  Social Connections: Not on file  Intimate Partner Violence: Not At Risk (06/22/2022)   Humiliation, Afraid, Rape, and Kick questionnaire    Fear of Current or Ex-Partner: No  Emotionally Abused: No    Physically Abused: No    Sexually Abused: No     Family History  Problem Relation Age of Onset   Heart attack Mother     ROS: Otherwise negative unless mentioned in HPI  Physical Examination  Vitals:   06/26/22 0835 06/26/22 0912  BP: 129/64 133/65  Pulse: 80 76  Resp: 15 16  Temp:  97.6 F (36.4 C)  SpO2: 93% 92%   Body mass index is 25.83 kg/m.  General:  WDWN in NAD Gait: Not observed HENT: WNL, normocephalic Pulmonary: normal non-labored breathing, without wheezing Cardiac: regular rate and rhythm Abdomen:  soft, NT/ND, no masses Extremities: without ischemic changes Musculoskeletal: no muscle wasting or atrophy  Neurologic: A&O X 3;  No focal weakness or paresthesias are detected; speech is fluent/normal Psychiatric:  The pt has Normal affect.  CBC    Component Value Date/Time   WBC 9.3 06/26/2022 0317   RBC 2.82 (L) 06/26/2022 0317   HGB 8.2 (L) 06/26/2022 0317   HGB 12.3 (L) 07/05/2021 1045   HCT 26.9 (L) 06/26/2022 0317   HCT 35.9 (L) 07/05/2021 1045   PLT 291 06/26/2022 0317   PLT 192 07/05/2021 1045   MCV 95.4 06/26/2022 0317   MCV 98 (H) 07/05/2021 1045   MCH 29.1 06/26/2022 0317   MCHC 30.5 06/26/2022 0317   RDW 16.4 (H) 06/26/2022 0317   RDW 12.7 07/05/2021 1045   LYMPHSABS 0.9 06/26/2022 0317   MONOABS 0.9 06/26/2022 0317   EOSABS 0.3 06/26/2022 0317   BASOSABS 0.0 06/26/2022 0317    BMET     Component Value Date/Time   NA 146 (H) 06/25/2022 1029   NA 143 07/05/2021 1045   K 3.5 06/25/2022 1029   CL 110 06/25/2022 1029   CO2 24 06/25/2022 1029   GLUCOSE 105 (H) 06/25/2022 1029   BUN 50 (H) 06/25/2022 1029   BUN 39 (H) 07/05/2021 1045   CREATININE 4.12 (H) 06/25/2022 1029   CALCIUM 9.4 06/25/2022 1029   GFRNONAA 13 (L) 06/25/2022 1029   GFRAA 16 (L) 04/25/2019 0441    COAGS: Lab Results  Component Value Date   INR 1.1 04/19/2019   INR 0.97 06/21/2017   INR 1.0 06/19/2017     Non-Invasive Vascular Imaging:   CT Abdomen and Pelvis: IMPRESSION: Peritoneal dialysis catheter in the anterior right lower quadrant adjacent to small bowel loops.   Moderate free fluid and scattered free air, likely related to peritoneal dialysis.   Small right pleural effusion.  Right base atelectasis.   Large stool burden.   Aortic atherosclerosis.  Statin:  Yes.   Beta Blocker:  Yes.   Aspirin:  Yes.   ACEI:  No. ARB:  No. CCB use:  No Other antiplatelets/anticoagulants:  No.    ASSESSMENT/PLAN: This is a 89 y.o. male with ESRD on peritoneal dialysis. His PD catheter has not been functioning for the past month. This may be related to constipation and large stool burden identified on CT.  Discussed with patient that we can attempt repositioning of the catheter to see if the PD can be salvaged as the patient wishes to continue PD if possible. He has eaten this morning so this cannot be done today.  Patient is agreeable to proceed. He understands that if catheter is unable to successfully be repositioned that he will need evaluated for other access AVF vs AVG. He has right IJ TDC placed by IR so has way to dialyze in the interim.  The on call surgeon,Dr. Randie Heinz will see patient to provide further management plans.   Graceann Congress PA-C [facility] [phone] 06/26/2022  9:58 AM  I have independently interviewed and examined patient and agree with PA assessment  and plan above.  CT reviewed appears that catheter is sitting cephalad to the bladder.  I attempted to reach the wife via telephone to no avail.  Plan will be tentatively for laparoscopic revision versus replacement of PD catheter in the OR with Dr. Lenell Antu on Thursday of this week.  I did discuss this with the patient today and he will continue HD via [facility] in the interim.  Jeree Delcid C. Randie Heinz, MD [facility] [place] Office: [phone] Pager: [phone]

## 2022-06-27 DIAGNOSIS — T85611A Breakdown (mechanical) of intraperitoneal dialysis catheter, initial encounter: Secondary | ICD-10-CM | POA: Diagnosis not present

## 2022-06-27 LAB — RENAL FUNCTION PANEL
Albumin: 2.5 g/dL — ABNORMAL LOW (ref 3.5–5.0)
Anion gap: 13 (ref 5–15)
BUN: 34 mg/dL — ABNORMAL HIGH (ref 8–23)
CO2: 23 mmol/L (ref 22–32)
Calcium: 8.9 mg/dL (ref 8.9–10.3)
Chloride: 109 mmol/L (ref 98–111)
Creatinine, Ser: 3.36 mg/dL — ABNORMAL HIGH (ref 0.61–1.24)
GFR, Estimated: 17 mL/min — ABNORMAL LOW (ref >60)
Glucose, Bld: 111 mg/dL — ABNORMAL HIGH (ref 70–99)
Phosphorus: 1.4 mg/dL — ABNORMAL LOW (ref 2.5–4.6)
Potassium: 3.5 mmol/L (ref 3.5–5.1)
Sodium: 145 mmol/L (ref 135–145)

## 2022-06-27 LAB — CBC
HCT: 33.9 % — ABNORMAL LOW (ref 39.0–52.0)
Hemoglobin: 10.2 g/dL — ABNORMAL LOW (ref 13.0–17.0)
MCH: 29.1 pg (ref 26.0–34.0)
MCHC: 30.1 g/dL (ref 30.0–36.0)
MCV: 96.6 fL (ref 80.0–100.0)
Platelets: 337 10*3/uL (ref 150–400)
RBC: 3.51 MIL/uL — ABNORMAL LOW (ref 4.22–5.81)
RDW: 16.6 % — ABNORMAL HIGH (ref 11.5–15.5)
WBC: 12.7 10*3/uL — ABNORMAL HIGH (ref 4.0–10.5)
nRBC: 1.3 % — ABNORMAL HIGH (ref 0.0–0.2)

## 2022-06-27 LAB — GLUCOSE, CAPILLARY
Glucose-Capillary: 81 mg/dL (ref 70–99)
Glucose-Capillary: 93 mg/dL (ref 70–99)
Glucose-Capillary: 147 mg/dL — ABNORMAL HIGH (ref 70–99)
Glucose-Capillary: 187 mg/dL — ABNORMAL HIGH (ref 70–99)

## 2022-06-27 LAB — HEPATITIS B SURFACE ANTIBODY, QUANTITATIVE: Hep B S AB Quant (Post): 44.8 m[IU]/mL (ref >9.9)

## 2022-06-27 MED ORDER — CHLORHEXIDINE GLUCONATE CLOTH 2 % EX PADS
6.0000 | MEDICATED_PAD | Freq: Every day | CUTANEOUS | Status: DC
  Administered 2022-06-27 – 2022-06-29 (×2): 6 via TOPICAL

## 2022-06-27 MED ORDER — PANTOPRAZOLE SODIUM 40 MG PO TBEC
40.0000 mg | DELAYED_RELEASE_TABLET | Freq: Every day | ORAL | Status: DC
  Administered 2022-06-27 – 2022-07-04 (×7): 40 mg via ORAL
  Filled 2022-06-27 (×7): qty 1

## 2022-06-27 NOTE — Progress Notes (Addendum)
[name] Progress Note   Subjective:   VVS consulted yesterday, suspect poor catheter position and offered OR on Thursday for laparoscopic revision vs. Replacement. He is still thinking about his options. Tolerated 2 hour dialysis treatment yesterday. Pt also reports he had multiple bowel movements today.   Pt is alert and oriented x3 at time of my exam but repeatedly insists that he needs to go home today. RN notes his mental status is waxing and waning. Discussed that he does not have a current discharge plan- needs to decide if we are going to attempt to revise his PD cath or switch to HD. If switching to HD, would need to CLIP to an outpatient dialysis unit which takes some time. He declined HD today because he wants to leave. Attempted to call his wife to discuss but she did not answer. RN was able to talk to her and reports wife feels pt is confused today.    Addendum: [name] wife called back, confirmed that she is interested in continuing PD and would like to proceed with PD catheter repositioning on Thursday. She will come to the hospital shortly to see pt.   Objective Vitals:   06/26/22 1835 06/26/22 2114 06/27/22 0400 06/27/22 0843  BP: (!) 168/87 (!) 151/74 (!) 150/74 (!) 169/71  Pulse: 77 80 79 82  Resp: 16 18 16 18   Temp: 98.8 F (37.1 C) 98.7 F (37.1 C) 98.7 F (37.1 C) 98.5 F (36.9 C)  TempSrc: Oral Oral Oral Oral  SpO2: 98% 92% 97% 97%  Weight:      Height:       Physical Exam General: Alert male in NAD Heart: RRR, 3/6 systolic murmur, no rubs or gallops Lungs:  CTA bilaterally, on RA Abdomen: soft, moderately distended, no TTP. PD cath in lower abdomen Extremities: 2+ pitting edema b/l lower extremities Dialysis Access: R IJ [facility]   Additional Objective Labs: Basic Metabolic Panel: Recent Labs  Lab 06/24/22 1006 06/25/22 1029 06/26/22 1600  NA 142 146* 143  K 3.1* 3.5 3.1*  CL 110 110 112*  CO2 23 24 25   GLUCOSE 232* 105* 117*   BUN 50* 50* 42*  CREATININE 3.78* 4.12* 3.70*  CALCIUM 9.3 9.4 8.3*  PHOS 1.8* 1.6* 1.4*   Liver Function Tests: Recent Labs  Lab 06/22/22 1507 06/23/22 1131 06/24/22 1006 06/25/22 1029 06/26/22 1600  AST 21 29  --   --   --   ALT 15 15  --   --   --   ALKPHOS 51 53  --   --   --   BILITOT 0.6 0.8  --   --   --   PROT 7.0 7.4  --   --   --   ALBUMIN 2.6* 2.7* 2.4* 2.5* 2.0*   Recent Labs  Lab 06/22/22 1507  LIPASE 37   CBC: Recent Labs  Lab 06/22/22 1507 06/24/22 1006 06/25/22 1029 06/26/22 0317 06/26/22 1600  WBC 8.2 11.5* 12.5* 9.3 8.8  NEUTROABS 6.1  --   --  7.0  --   HGB 9.6* 9.0* 9.3* 8.2* 8.1*  HCT 31.9* 28.4* 29.5* 26.9* 26.2*  MCV 96.4 93.1 94.6 95.4 96.0  PLT 325 313 360 291 272    CBG: Recent Labs  Lab 06/26/22 0746 06/26/22 1122 06/26/22 1837 06/26/22 2115 06/27/22 0725  GLUCAP 104* 87 93 102* 81   Iron Studies:  Recent Labs    06/24/22 1006  IRON 21*  TIBC 244*   @  lablastinr3@ Studies/Results: IR Fluoro Guide CV Line Right  Result Date: 06/26/2022 INDICATION: 89 year old male history of end-stage renal disease requiring tunneled hemodialysis catheter placement due to peritoneal dialysis catheter malfunction. EXAM: TUNNELED CENTRAL VENOUS HEMODIALYSIS CATHETER PLACEMENT WITH ULTRASOUND AND FLUOROSCOPIC GUIDANCE MEDICATIONS: Ancef 2 gm IV . The antibiotic was given in an appropriate time interval prior to skin puncture. ANESTHESIA/SEDATION: Moderate (conscious) sedation was employed during this procedure. A total of Versed 0.5 mg and Fentanyl 25 mcg was administered intravenously. Moderate Sedation Time: 10 minutes. The patient's level of consciousness and vital signs were monitored continuously by radiology nursing throughout the procedure under my direct supervision. FLUOROSCOPY TIME:  One mGy COMPLICATIONS: None immediate. PROCEDURE: Informed written consent was obtained from the patient after a discussion of the risks, benefits, and  alternatives to treatment. Questions regarding the procedure were encouraged and answered. The right neck and chest were prepped with chlorhexidine in a sterile fashion, and a sterile drape was applied covering the operative field. Maximum barrier sterile technique with sterile gowns and gloves were used for the procedure. A timeout was performed prior to the initiation of the procedure. After creating a small venotomy incision, a 21 gauge micropuncture kit was utilized to access the internal jugular vein. Real-time ultrasound guidance was utilized for vascular access including the acquisition of a permanent ultrasound image documenting patency of the accessed vessel. A Rosen wire was advanced to the level of the IVC and the micropuncture sheath was exchanged for an 8 Fr dilator. A 14.5 French tunneled hemodialysis catheter measuring 19 cm from tip to cuff was tunneled in a retrograde fashion from the anterior chest wall to the venotomy incision. Serial dilation was then performed an a peel-away sheath was placed. The catheter was then placed through the peel-away sheath with the catheter tip ultimately positioned within the right atrium. Final catheter positioning was confirmed and documented with a spot radiographic image. The catheter aspirates and flushes normally. The catheter was flushed with appropriate volume heparin dwells. The catheter exit site was secured with a 0-Silk retention suture. The venotomy incision was closed with Dermabond. Sterile dressings were applied. The patient tolerated the procedure well without immediate post procedural complication. IMPRESSION: Successful placement of 19 cm tip to cuff tunneled hemodialysis catheter via the right internal jugular vein with catheter tip terminating within the right atrium. The catheter is ready for immediate use. Marliss Coots, MD [facility] Radiology Electronically Signed   By: Marliss Coots M.D.   On:  06/26/2022 09:25   IR US Guide Vasc Access Right  Result Date: 06/26/2022 INDICATION: 89 year old male history of end-stage renal disease requiring tunneled hemodialysis catheter placement due to peritoneal dialysis catheter malfunction. EXAM: TUNNELED CENTRAL VENOUS HEMODIALYSIS CATHETER PLACEMENT WITH ULTRASOUND AND FLUOROSCOPIC GUIDANCE MEDICATIONS: Ancef 2 gm IV . The antibiotic was given in an appropriate time interval prior to skin puncture. ANESTHESIA/SEDATION: Moderate (conscious) sedation was employed during this procedure. A total of Versed 0.5 mg and Fentanyl 25 mcg was administered intravenously. Moderate Sedation Time: 10 minutes. The patient's level of consciousness and vital signs were monitored continuously by radiology nursing throughout the procedure under my direct supervision. FLUOROSCOPY TIME:  One mGy COMPLICATIONS: None immediate. PROCEDURE: Informed written consent was obtained from the patient after a discussion of the risks, benefits, and alternatives to treatment. Questions regarding the procedure were encouraged and answered. The right neck and chest were prepped with chlorhexidine in a sterile fashion, and a sterile drape was applied covering  the operative field. Maximum barrier sterile technique with sterile gowns and gloves were used for the procedure. A timeout was performed prior to the initiation of the procedure. After creating a small venotomy incision, a 21 gauge micropuncture kit was utilized to access the internal jugular vein. Real-time ultrasound guidance was utilized for vascular access including the acquisition of a permanent ultrasound image documenting patency of the accessed vessel. A Rosen wire was advanced to the level of the IVC and the micropuncture sheath was exchanged for an 8 Fr dilator. A 14.5 French tunneled hemodialysis catheter measuring 19 cm from tip to cuff was tunneled in a retrograde fashion from the anterior chest wall to the venotomy incision.  Serial dilation was then performed an a peel-away sheath was placed. The catheter was then placed through the peel-away sheath with the catheter tip ultimately positioned within the right atrium. Final catheter positioning was confirmed and documented with a spot radiographic image. The catheter aspirates and flushes normally. The catheter was flushed with appropriate volume heparin dwells. The catheter exit site was secured with a 0-Silk retention suture. The venotomy incision was closed with Dermabond. Sterile dressings were applied. The patient tolerated the procedure well without immediate post procedural complication. IMPRESSION: Successful placement of 19 cm tip to cuff tunneled hemodialysis catheter via the right internal jugular vein with catheter tip terminating within the right atrium. The catheter is ready for immediate use. Marliss Coots, MD [facility] Radiology Electronically Signed   By: Marliss Coots M.D.   On: 06/26/2022 09:25   Medications:  dialysis solution 2.5% low-MG/low-CA Stopped (06/25/22 1803)   ferric gluconate (FERRLECIT) IVPB 250 mg (06/26/22 1010)    atorvastatin  80 mg Oral Daily   carvedilol  3.125 mg Oral BID WC   Chlorhexidine Gluconate Cloth  6 each Topical Daily   gentamicin cream  1 Application Topical Daily   hydrALAZINE  50 mg Oral Q12H   insulin aspart  0-5 Units Subcutaneous QHS   insulin aspart  0-6 Units Subcutaneous TID WC   insulin glargine-yfgn  8 Units Subcutaneous BID   isosorbide mononitrate  30 mg Oral Daily   lactulose  30 g Oral Daily   lidocaine-EPINEPHrine  20 mL Infiltration Once   linagliptin  5 mg Oral Daily   pantoprazole (PROTONIX) IV  40 mg Intravenous Q24H   polyethylene glycol  17 g Oral BID    Dialysis Orders: [facility] PD CCPD 5 exchanges 2.5L dwell 1 hr 30 min dwell time No pause, no day dwell  Assessment/Plan: 1 Underdialysis/ PD catheter malfunction/ cognitive decline:  -Pt and  wife's primary goal is to remain at home.  CT showed large stool burden and PD catheter in the anterior right lower quadrant adjacent to small bowel loops. Completed bowel regimen but multiple attempts to drain unsuccessful.  Apparently now constipated again.  -TDC placed by IR for hopefully temporary HD while determining cause for PD catheter dysfunction. Had 2 hour HD yesterday and tolerated well, would like to do another HD today but he refuses. Scheduled for tomorrow.  -Vascular surgery consulted 4/29, offered laparoscopic revision on Thursday -Per his primary nephrologist "they really don't want to convert to HD and I get it- if that has to be pursued hopefully it would be just a temporary conversion to optimize him in terms of volume and uremia and then transition back to PD.  He was previously independent with everything and always had perfect gold report cards so this is  tough on him and his wife."  [name] is willing to help in whatever way she can to aid in having him remain at home therapies.  She went to a class to retrain today to help assist with PD at home now that his cognitive decline prevents him from doing it all himself.  -Patient wants to go home today. Again this is NOT recommended. He is a bit confused today, wife is aware. She would like him to have procedure to reposition PD catheter on Thursday.   2 ESRD: pn PD--> see above in #1.  On HD temporarily   3. Hypokalemia - Using high K+ bath with HD. Rechecking labs today.    4 Hypertension/ volume: overloaded, Ordered lasix to help improve volume. +LE edema, no respiratory distress. UF today with HD as tolerated. BP improved this AM, on carvedilol 3.125mg  BID and hydralazine 50mg  BID   4. Anemia of ESRD: Last Hgb 8.2. tsat 9%, ordered IV iron course.   5. Metabolic Bone Disease: Corrected Ca high, hold calcitriol for now.  Phos low - looks like he was still getting auryxia, stopped it this AM. Rechecking labs, may need a phos  supplement.    6.  Dispo: Will need TDC placement with arrangement for temporary OP HD if we cannot get PD cath working this admission.     Nathaniel Blocker, PA-C 06/27/2022, 9:18 AM  [facility] Pager: [phone]

## 2022-06-27 NOTE — TOC Initial Note (Signed)
Transition of Care [facility]) - Initial/Assessment Note    Patient Details  Name: [name] MRN: [id number] Date of Birth: 02-Nov-1933  Transition [facility] Care [facility]) CM/SW Contact:    Tom-Johnson, Hershal Coria, RN Phone Number: 06/27/2022, 3:52 PM  Clinical Narrative:                  CM spoke with patient at bedside about needs for post hospital transition. Admitted for Peritoneal Dialysis Dysfunction.  Patient has hx of ESRD and on  Peritoneal Dialysis at home. Patient had a temporary Dialysis Catheter placement on 06/26/22 and started on intermittent hemodialysis inpatient. Vascular following and will either repair or exchange PD cath scheduled for Thursday 06/29/22. Patient is from home with wife who assists him with his PD. Has four supportive children. Does not drive, wife and children assists with transporting to and from appointments.   Has a cane and walker at home. PCP is Linus Galas, NP [facility] [address] No TOC needs or recommendations noted at this time. CM will continue to follow as patient progresses with care towards discharge.              Expected Discharge Plan: Home/Self Care Barriers to Discharge: Continued Medical Work up   Patient Goals and CMS Choice Patient states their goals for this hospitalization and ongoing recovery are:: To return home CMS Medicare.gov Compare Post Acute Care list provided to:: Patient Choice offered to / list presented to : NA      Expected Discharge Plan and Services   Discharge Planning Services: CM Consult Post Acute Care Choice: NA Living arrangements for the past 2 months: Single Family Home                 DME Arranged: N/A DME Agency: NA       HH Arranged: NA HH Agency: NA        Prior Living Arrangements/Services Living arrangements for the past 2 months: Single Family Home Lives with:: Spouse Patient language and need for interpreter reviewed:: Yes Do you feel safe going back to  the place where you live?: Yes      Need for Family Participation in Patient Care: Yes (Comment) Care giver support system in place?: Yes (comment)   Criminal Activity/Legal Involvement Pertinent to Current Situation/Hospitalization: No - Comment as needed  Activities of Daily Living Home Assistive Devices/Equipment: Cane (specify quad or straight), Other (Comment) (Dexcom) ADL Screening (condition at time of admission) Patient's cognitive ability adequate to safely complete daily activities?: Yes Is the patient deaf or have difficulty hearing?: Yes Does the patient have difficulty seeing, even when wearing glasses/contacts?: No Does the patient have difficulty concentrating, remembering, or making decisions?: No Patient able to express need for assistance with ADLs?: Yes Does the patient have difficulty dressing or bathing?: No Independently performs ADLs?: Yes (appropriate for developmental age) Does the patient have difficulty walking or climbing stairs?: No Weakness of Legs: None Weakness of Arms/Hands: None  Permission Sought/Granted Permission sought to share information with : Case Manager, Family Supports Permission granted to share information with : Yes, Verbal Permission Granted              Emotional Assessment Appearance:: Appears stated age Attitude/Demeanor/Rapport: Engaged, Gracious Affect (typically observed): Accepting, Appropriate, Calm, Hopeful, Pleasant Orientation: : Oriented to Self, Oriented to Place, Oriented to  Time, Oriented to Situation Alcohol / Substance Use: Not Applicable Psych Involvement: No (comment)  Admission diagnosis:  Hyperglycemia [R73.9] Peritoneal  dialysis catheter dysfunction (HCC) [T85.611A] Peritoneal dialysis catheter dysfunction, initial encounter (HCC) [T85.611A] Hypervolemia, unspecified hypervolemia type [E87.70] Patient Active Problem List   Diagnosis Date Noted   Peritoneal dialysis catheter dysfunction (HCC) 06/22/2022    DNR (do not resuscitate) 06/22/2022   Hypokalemia    ESRD on peritoneal dialysis (HCC)    Nephrotic range proteinuria    Difficulty urinating    SOB (shortness of breath) on exertion 06/20/2017   Other fatigue 06/19/2017   Congestive heart failure (HCC) 02/28/2017   Peripheral artery disease (HCC)    Hypertension    DM (diabetes mellitus) (HCC)    Coronary artery disease    Moderate aortic stenosis by prior echocardiogram - mean gradient 24.3 mmHg. peak gradient 42.9 mmHg. Aortic valve area, by VTI measures 1.15  cm. 10/16/2016   Uncontrolled hypersomnia 10/07/2015   Murmur 09/12/2013   Dizziness 08/22/2012   CAD (coronary artery disease) of artery bypass graft 08/22/2012   Hx of CABG 08/22/2012   Essential hypertension 08/22/2012   Dyslipidemia 08/22/2012   PAD (peripheral artery disease) (HCC) 08/22/2012   DM2 (diabetes mellitus, type 2) (HCC) 08/22/2012   PCP:  Linus Galas, NP [phone] [place] [place] - [address] [place] [address] Phone: [phone] Fax: [phone]     Social Determinants of Health (SDOH) Social History: SDOH Screenings   Food Insecurity: No Food Insecurity (06/22/2022)  Housing: Low Risk  (06/22/2022)  Transportation Needs: No Transportation Needs (06/22/2022)  Utilities: Not At Risk (06/22/2022)  Depression (PHQ2-9): Low Risk  (07/13/2020)  Physical Activity: Sufficiently Active (10/31/2019)  Tobacco Use: Medium Risk (06/26/2022)   SDOH Interventions: Transportation Interventions: Intervention Not Indicated, Inpatient TOC, Patient Resources (Friends/Family)   Readmission Risk Interventions    06/27/2022    3:51 PM  Readmission Risk Prevention Plan  Transportation Screening Complete  PCP or Specialist Appt within 5-7 Days Complete  Home Care Screening Complete  Medication Review (RN CM) Referral to Pharmacy

## 2022-06-27 NOTE — Progress Notes (Addendum)
      Progress Note    06/27/2022 10:32 AM * No surgery date entered *  Subjective: No overnight issues  Vitals:   06/27/22 0400 06/27/22 0843  BP: (!) 150/74 (!) 169/71  Pulse: 79 82  Resp: 16 18  Temp: 98.7 F (37.1 C) 98.5 F (36.9 C)  SpO2: 97% 97%    Physical Exam: Awake and alert although trying to get out of bed Right IJ tunneled dialysis catheter in place without hematoma  CBC    Component Value Date/Time   WBC 8.8 06/26/2022 1600   RBC 2.73 (L) 06/26/2022 1600   HGB 8.1 (L) 06/26/2022 1600   HGB 12.3 (L) 07/05/2021 1045   HCT 26.2 (L) 06/26/2022 1600   HCT 35.9 (L) 07/05/2021 1045   PLT 272 06/26/2022 1600   PLT 192 07/05/2021 1045   MCV 96.0 06/26/2022 1600   MCV 98 (H) 07/05/2021 1045   MCH 29.7 06/26/2022 1600   MCHC 30.9 06/26/2022 1600   RDW 16.5 (H) 06/26/2022 1600   RDW 12.7 07/05/2021 1045   LYMPHSABS 0.9 06/26/2022 0317   MONOABS 0.9 06/26/2022 0317   EOSABS 0.3 06/26/2022 0317   BASOSABS 0.0 06/26/2022 0317    BMET    Component Value Date/Time   NA 143 06/26/2022 1600   NA 143 07/05/2021 1045   K 3.1 (L) 06/26/2022 1600   CL 112 (H) 06/26/2022 1600   CO2 25 06/26/2022 1600   GLUCOSE 117 (H) 06/26/2022 1600   BUN 42 (H) 06/26/2022 1600   BUN 39 (H) 07/05/2021 1045   CREATININE 3.70 (H) 06/26/2022 1600   CALCIUM 8.3 (L) 06/26/2022 1600   GFRNONAA 15 (L) 06/26/2022 1600   GFRAA 16 (L) 04/25/2019 0441    INR    Component Value Date/Time   INR 1.1 04/19/2019 0332     Intake/Output Summary (Last 24 hours) at 06/27/2022 1032 Last data filed at 06/27/2022 0600 Gross per 24 hour  Intake 0 ml  Output 1400 ml  Net -1400 ml     Assessment:  89 y.o. male is here with malfunctioning PD catheter  Plan: Possibly OR Thursday for PD catheter revision versus replacement with Dr. Lenell Antu.  I am unsure how well he understands the plan currently stating that he needs more time to think about his options.   Kalin Amrhein C. Randie Heinz,  MD [facility] Office: [phone] Pager: [phone]  06/27/2022 10:32 AM

## 2022-06-27 NOTE — Progress Notes (Signed)
PROGRESS NOTE    [name]  ZOX:[id number] DOB: 03/25/1933 DOA: 06/22/2022 PCP: Linus Galas, NP   Brief Narrative:  89 year old African-American male history of end-stage renal disease on peritoneal dialysis for last 3 years, moderate aortic stenosis by echocardiogram, type 2 diabetes, coronary disease status post CABG, hypertension, mild cognitive impairment who presented the ER with issues regarding his peritoneal dialysis. it appears that the patient and his wife have not been able to manage his peritoneal dialysis at home.  Wife states over the last month and a half, the patient's had issues with his peritoneal dialysis.  Patient states that he will hang a bag of dialysate fluid on his home cycler.  But when he wakes up in the morning, the bag of dialysate fluid is full and the waste fluid bag is empty.  Wife has noticed the patient having lower extreme edema for the last week.   Patient was sent to the dialysis center, per the note from Dr. Signe Colt, and had his peritoneal cavity filled with dialysis fluid.  They had trouble draining the fluid from the cycler or by manual drainage.  Patient was sent to the ER by Dr. Signe Colt.  Assessment & Plan:   Principal Problem:   Peritoneal dialysis catheter dysfunction (HCC) Active Problems:   Moderate aortic stenosis by prior echocardiogram - mean gradient 24.3 mmHg. peak gradient 42.9 mmHg. Aortic valve area, by VTI measures 1.15  cm.   Essential hypertension   DM2 (diabetes mellitus, type 2) (HCC)   Hypokalemia   ESRD on peritoneal dialysis (HCC)   DNR (do not resuscitate)  ESRD on PD/peritoneal dialysis catheter dysfunction (HCC) CT abdomen shows heavy burden of stool/constipation.  Nephrology on board and opined that he is PD catheter tip appears to be in pelvis and his constipation could be the reason for occlusion.  Patient was started on bowel prep, he had several bowel movements but catheter did not work.  Eventually patient received  temporary dialysis catheter and has been started on intermittent hemodialysis for now.  Vascular surgery has been consulted and patient is scheduled to have his PD catheter repaired or exchanged on Thursday morning.  Appreciate vascular as well as nephrology.   Moderate aortic stenosis by prior echocardiogram - mean gradient 24.3 mmHg. peak gradient 42.9 mmHg. Aortic valve area, by VTI measures 1.15  cm. Chronic. May need to watch out for hypotension during dialysis.   Hypokalemia Resolved.  Per nephrology.   DM2 (diabetes mellitus, type 2) (HCC) Currently on Tradjenta and Semglee 10 units and SSI.  Blood sugar fairly controlled.     Essential hypertension Only slightly elevated, hopefully will improve with intermittent hemodialysis.  Continue current home medications.  DVT prophylaxis: SCDs Start: 06/22/22 2100   Code Status: DNR  Family Communication:  None present at bedside.  Consultants are talking to the patient's wife regularly.  Status is: Inpatient Remains inpatient appropriate because: PD is still not working.  Patient is scheduled to have this fixed on Thursday morning.  Earliest discharge date will be Friday.    Estimated body mass index is 25.83 kg/m as calculated from the following:   Height as of this encounter: 5\' 11"  (1.803 m).   Weight as of this encounter: 84 kg.    Nutritional Assessment: Body mass index is 25.83 kg/m.Marland Kitchen Seen by dietician.  I agree with the assessment and plan as outlined below: Nutrition Status:        . Skin Assessment: I have examined the patient's skin  and I agree with the wound assessment as performed by the wound care RN as outlined below:    Consultants:  Nephrology  Procedures:  None  Antimicrobials:  Anti-infectives (From admission, onward)    Start     Dose/Rate Route Frequency Ordered Stop   06/26/22 0810  ceFAZolin (ANCEF) IVPB 2g/100 mL premix        over 30 Minutes Intravenous Continuous PRN 06/26/22 0812  06/26/22 0830         Subjective: Patient seen and examined.  He was complaining of some sore throat.  Nurse was at the bedside.  I advised her to give him Cepacol.  Objective: Vitals:   06/26/22 1835 06/26/22 2114 06/27/22 0400 06/27/22 0843  BP: (!) 168/87 (!) 151/74 (!) 150/74 (!) 169/71  Pulse: 77 80 79 82  Resp: 16 18 16 18   Temp: 98.8 F (37.1 C) 98.7 F (37.1 C) 98.7 F (37.1 C) 98.5 F (36.9 C)  TempSrc: Oral Oral Oral Oral  SpO2: 98% 92% 97% 97%  Weight:      Height:        Intake/Output Summary (Last 24 hours) at 06/27/2022 1017 Last data filed at 06/27/2022 0600 Gross per 24 hour  Intake 0 ml  Output 1400 ml  Net -1400 ml    Filed Weights   06/22/22 1458 06/22/22 2103 06/24/22 0500  Weight: 76.4 kg 83.4 kg 84 kg    Examination:  General exam: Appears calm and comfortable  Respiratory system: Clear to auscultation. Respiratory effort normal. Cardiovascular system: S1 & S2 heard, RRR. No JVD, murmurs, rubs, gallops or clicks. No pedal edema. Gastrointestinal system: Abdomen is nondistended, soft and nontender. No organomegaly or masses felt. Normal bowel sounds heard. Central nervous system: Alert and oriented. No focal neurological deficits. Extremities: Symmetric 5 x 5 power. Skin: No rashes, lesions or ulcers.  Psychiatry: Judgement and insight appear poor  Data Reviewed: I have personally reviewed following labs and imaging studies  CBC: Recent Labs  Lab 06/22/22 1507 06/24/22 1006 06/25/22 1029 06/26/22 0317 06/26/22 1600  WBC 8.2 11.5* 12.5* 9.3 8.8  NEUTROABS 6.1  --   --  7.0  --   HGB 9.6* 9.0* 9.3* 8.2* 8.1*  HCT 31.9* 28.4* 29.5* 26.9* 26.2*  MCV 96.4 93.1 94.6 95.4 96.0  PLT 325 313 360 291 272    Basic Metabolic Panel: Recent Labs  Lab 06/22/22 1507 06/23/22 1131 06/24/22 1006 06/25/22 1029 06/26/22 1600  NA 141 143 142 146* 143  K 3.2* 3.0* 3.1* 3.5 3.1*  CL 107 111 110 110 112*  CO2 25 23 23 24 25   GLUCOSE 353* 216*  232* 105* 117*  BUN 56* 50* 50* 50* 42*  CREATININE 3.99* 3.85* 3.78* 4.12* 3.70*  CALCIUM 9.5 9.5 9.3 9.4 8.3*  MG  --  2.0  --   --   --   PHOS  --   --  1.8* 1.6* 1.4*    GFR: Estimated Creatinine Clearance: 15.3 mL/min (A) (by C-G formula based on SCr of 3.7 mg/dL (H)). Liver Function Tests: Recent Labs  Lab 06/22/22 1507 06/23/22 1131 06/24/22 1006 06/25/22 1029 06/26/22 1600  AST 21 29  --   --   --   ALT 15 15  --   --   --   ALKPHOS 51 53  --   --   --   BILITOT 0.6 0.8  --   --   --   PROT 7.0 7.4  --   --   --  ALBUMIN 2.6* 2.7* 2.4* 2.5* 2.0*    Recent Labs  Lab 06/22/22 1507  LIPASE 37    No results for input(s): "AMMONIA" in the last 168 hours. Coagulation Profile: No results for input(s): "INR", "PROTIME" in the last 168 hours. Cardiac Enzymes: No results for input(s): "CKTOTAL", "CKMB", "CKMBINDEX", "TROPONINI" in the last 168 hours. BNP (last 3 results) No results for input(s): "PROBNP" in the last 8760 hours. HbA1C: No results for input(s): "HGBA1C" in the last 72 hours.  CBG: Recent Labs  Lab 06/26/22 0746 06/26/22 1122 06/26/22 1837 06/26/22 2115 06/27/22 0725  GLUCAP 104* 87 93 102* 81    Lipid Profile: No results for input(s): "CHOL", "HDL", "LDLCALC", "TRIG", "CHOLHDL", "LDLDIRECT" in the last 72 hours. Thyroid Function Tests: No results for input(s): "TSH", "T4TOTAL", "FREET4", "T3FREE", "THYROIDAB" in the last 72 hours. Anemia Panel: No results for input(s): "VITAMINB12", "FOLATE", "FERRITIN", "TIBC", "IRON", "RETICCTPCT" in the last 72 hours.  Sepsis Labs: No results for input(s): "PROCALCITON", "LATICACIDVEN" in the last 168 hours.  No results found for this or any previous visit (from the past 240 hour(s)).   Radiology Studies: IR Fluoro Guide CV Line Right  Result Date: 06/26/2022 INDICATION: 89 year old male history of end-stage renal disease requiring tunneled hemodialysis catheter placement due to peritoneal dialysis  catheter malfunction. EXAM: TUNNELED CENTRAL VENOUS HEMODIALYSIS CATHETER PLACEMENT WITH ULTRASOUND AND FLUOROSCOPIC GUIDANCE MEDICATIONS: Ancef 2 gm IV . The antibiotic was given in an appropriate time interval prior to skin puncture. ANESTHESIA/SEDATION: Moderate (conscious) sedation was employed during this procedure. A total of Versed 0.5 mg and Fentanyl 25 mcg was administered intravenously. Moderate Sedation Time: 10 minutes. The patient's level of consciousness and vital signs were monitored continuously by radiology nursing throughout the procedure under my direct supervision. FLUOROSCOPY TIME:  One mGy COMPLICATIONS: None immediate. PROCEDURE: Informed written consent was obtained from the patient after a discussion of the risks, benefits, and alternatives to treatment. Questions regarding the procedure were encouraged and answered. The right neck and chest were prepped with chlorhexidine in a sterile fashion, and a sterile drape was applied covering the operative field. Maximum barrier sterile technique with sterile gowns and gloves were used for the procedure. A timeout was performed prior to the initiation of the procedure. After creating a small venotomy incision, a 21 gauge micropuncture kit was utilized to access the internal jugular vein. Real-time ultrasound guidance was utilized for vascular access including the acquisition of a permanent ultrasound image documenting patency of the accessed vessel. A Rosen wire was advanced to the level of the IVC and the micropuncture sheath was exchanged for an 8 Fr dilator. A 14.5 French tunneled hemodialysis catheter measuring 19 cm from tip to cuff was tunneled in a retrograde fashion from the anterior chest wall to the venotomy incision. Serial dilation was then performed an a peel-away sheath was placed. The catheter was then placed through the peel-away sheath with the catheter tip ultimately positioned within the right atrium. Final catheter positioning  was confirmed and documented with a spot radiographic image. The catheter aspirates and flushes normally. The catheter was flushed with appropriate volume heparin dwells. The catheter exit site was secured with a 0-Silk retention suture. The venotomy incision was closed with Dermabond. Sterile dressings were applied. The patient tolerated the procedure well without immediate post procedural complication. IMPRESSION: Successful placement of 19 cm tip to cuff tunneled hemodialysis catheter via the right internal jugular vein with catheter tip terminating within the right atrium. The catheter is ready for  immediate use. Marliss Coots, MD [facility] Radiology Electronically Signed   By: Marliss Coots M.D.   On: 06/26/2022 09:25   IR US Guide Vasc Access Right  Result Date: 06/26/2022 INDICATION: 89 year old male history of end-stage renal disease requiring tunneled hemodialysis catheter placement due to peritoneal dialysis catheter malfunction. EXAM: TUNNELED CENTRAL VENOUS HEMODIALYSIS CATHETER PLACEMENT WITH ULTRASOUND AND FLUOROSCOPIC GUIDANCE MEDICATIONS: Ancef 2 gm IV . The antibiotic was given in an appropriate time interval prior to skin puncture. ANESTHESIA/SEDATION: Moderate (conscious) sedation was employed during this procedure. A total of Versed 0.5 mg and Fentanyl 25 mcg was administered intravenously. Moderate Sedation Time: 10 minutes. The patient's level of consciousness and vital signs were monitored continuously by radiology nursing throughout the procedure under my direct supervision. FLUOROSCOPY TIME:  One mGy COMPLICATIONS: None immediate. PROCEDURE: Informed written consent was obtained from the patient after a discussion of the risks, benefits, and alternatives to treatment. Questions regarding the procedure were encouraged and answered. The right neck and chest were prepped with chlorhexidine in a sterile fashion, and a sterile drape was  applied covering the operative field. Maximum barrier sterile technique with sterile gowns and gloves were used for the procedure. A timeout was performed prior to the initiation of the procedure. After creating a small venotomy incision, a 21 gauge micropuncture kit was utilized to access the internal jugular vein. Real-time ultrasound guidance was utilized for vascular access including the acquisition of a permanent ultrasound image documenting patency of the accessed vessel. A Rosen wire was advanced to the level of the IVC and the micropuncture sheath was exchanged for an 8 Fr dilator. A 14.5 French tunneled hemodialysis catheter measuring 19 cm from tip to cuff was tunneled in a retrograde fashion from the anterior chest wall to the venotomy incision. Serial dilation was then performed an a peel-away sheath was placed. The catheter was then placed through the peel-away sheath with the catheter tip ultimately positioned within the right atrium. Final catheter positioning was confirmed and documented with a spot radiographic image. The catheter aspirates and flushes normally. The catheter was flushed with appropriate volume heparin dwells. The catheter exit site was secured with a 0-Silk retention suture. The venotomy incision was closed with Dermabond. Sterile dressings were applied. The patient tolerated the procedure well without immediate post procedural complication. IMPRESSION: Successful placement of 19 cm tip to cuff tunneled hemodialysis catheter via the right internal jugular vein with catheter tip terminating within the right atrium. The catheter is ready for immediate use. Marliss Coots, MD [facility] Radiology Electronically Signed   By: Marliss Coots M.D.   On: 06/26/2022 09:25    Scheduled Meds:  atorvastatin  80 mg Oral Daily   carvedilol  3.125 mg Oral BID WC   Chlorhexidine Gluconate Cloth  6 each Topical Daily   Chlorhexidine Gluconate  Cloth  6 each Topical Q0600   gentamicin cream  1 Application Topical Daily   hydrALAZINE  50 mg Oral Q12H   insulin aspart  0-5 Units Subcutaneous QHS   insulin aspart  0-6 Units Subcutaneous TID WC   insulin glargine-yfgn  8 Units Subcutaneous BID   isosorbide mononitrate  30 mg Oral Daily   lactulose  30 g Oral Daily   lidocaine-EPINEPHrine  20 mL Infiltration Once   linagliptin  5 mg Oral Daily   pantoprazole (PROTONIX) IV  40 mg Intravenous Q24H   polyethylene glycol  17 g Oral BID  Continuous Infusions:  dialysis solution 2.5% low-MG/low-CA Stopped (06/25/22 1803)   ferric gluconate (FERRLECIT) IVPB 250 mg (06/26/22 1010)     LOS: 4 days   Hughie Closs, MD [facility]  06/27/2022, 10:17 AM   *Please note that this is a verbal dictation therefore any spelling or grammatical errors are due to the "[employer]agon [employer] One" system interpretation.  Please page via Amion and do not message via secure chat for urgent patient care matters. Secure chat can be used for non urgent patient care matters.  How to contact the [facility] Attending or Consulting provider 7A - 7P or covering provider during after hours 7P -7A, for this patient?  Check the care team in [facility] and look for a) attending/consulting [facility] provider listed and b) the [facility] team listed. Page or secure chat 7A-7P. Log into [email or web address] and use [facility]'s universal password to access. If you do not have the password, please contact the hospital operator. Locate the [facility] provider you are looking for under [facility] and page to a number that you can be directly reached. If you still have difficulty reaching the provider, please page the [facility] (Director on Call) for the Hospitalists listed on amion for assistance.

## 2022-06-28 DIAGNOSIS — T85611A Breakdown (mechanical) of intraperitoneal dialysis catheter, initial encounter: Secondary | ICD-10-CM | POA: Diagnosis not present

## 2022-06-28 LAB — GLUCOSE, CAPILLARY
Glucose-Capillary: 119 mg/dL — ABNORMAL HIGH (ref 70–99)
Glucose-Capillary: 141 mg/dL — ABNORMAL HIGH (ref 70–99)
Glucose-Capillary: 93 mg/dL (ref 70–99)
Glucose-Capillary: 105 mg/dL — ABNORMAL HIGH (ref 70–99)

## 2022-06-28 MED ORDER — K PHOS MONO-SOD PHOS DI & MONO 155-852-130 MG PO TABS
250.0000 mg | ORAL_TABLET | Freq: Two times a day (BID) | ORAL | Status: AC
  Administered 2022-06-28 (×2): 250 mg via ORAL
  Filled 2022-06-28 (×2): qty 1

## 2022-06-28 MED ORDER — HEPARIN SODIUM (PORCINE) 1000 UNIT/ML IJ SOLN
INTRAMUSCULAR | Status: AC
  Filled 2022-06-28: qty 4

## 2022-06-28 NOTE — Plan of Care (Signed)
  Problem: Education: Goal: Knowledge of disease and its progression will improve Outcome: Progressing   

## 2022-06-28 NOTE — Progress Notes (Signed)
[facility] Progress Note   Subjective:   Last phos 1.4, ordered k phos x2 doses. Planned for HD today. Pt seated on bedside, reports feeling well. Denies SOB, CP, palpitations,dizziness, nausea and weakness. Agrees to HD today.   Objective Vitals:   06/27/22 0843 06/27/22 1639 06/27/22 2147 06/28/22 0439  BP: (!) 169/71 (!) 144/75 (!) 178/86 (!) 160/71  Pulse: 82 77 78 69  Resp: 18 18 18 18   Temp: 98.5 F (36.9 C) 98.4 F (36.9 C) 98.7 F (37.1 C) 98.3 F (36.8 C)  TempSrc: Oral  Oral Oral  SpO2: 97% 98% 97% 99%  Weight:      Height:       Physical Exam General: Alert male in NAD Heart: RRR, no murmurs, rubs or gallops Lungs: CTA bilaterally Abdomen: Soft, non-distended, +BS Extremities: 2+ pitting edema b/l lower extremities Dialysis Access:  [facility], PD cath in abdomen  Additional Objective Labs: Basic Metabolic Panel: Recent Labs  Lab 06/25/22 1029 06/26/22 1600 06/27/22 1101  NA 146* 143 145  K 3.5 3.1* 3.5  CL 110 112* 109  CO2 24 25 23   GLUCOSE 105* 117* 111*  BUN 50* 42* 34*  CREATININE 4.12* 3.70* 3.36*  CALCIUM 9.4 8.3* 8.9  PHOS 1.6* 1.4* 1.4*   Liver Function Tests: Recent Labs  Lab 06/22/22 1507 06/23/22 1131 06/24/22 1006 06/25/22 1029 06/26/22 1600 06/27/22 1101  AST 21 29  --   --   --   --   ALT 15 15  --   --   --   --   ALKPHOS 51 53  --   --   --   --   BILITOT 0.6 0.8  --   --   --   --   PROT 7.0 7.4  --   --   --   --   ALBUMIN 2.6* 2.7*   < > 2.5* 2.0* 2.5*   < > = values in this interval not displayed.   Recent Labs  Lab 06/22/22 1507  LIPASE 37   CBC: Recent Labs  Lab 06/22/22 1507 06/24/22 1006 06/25/22 1029 06/26/22 0317 06/26/22 1600 06/27/22 1101  WBC 8.2 11.5* 12.5* 9.3 8.8 12.7*  NEUTROABS 6.1  --   --  7.0  --   --   HGB 9.6* 9.0* 9.3* 8.2* 8.1* 10.2*  HCT 31.9* 28.4* 29.5* 26.9* 26.2* 33.9*  MCV 96.4 93.1 94.6 95.4 96.0 96.6  PLT 325 313 360 291 272 337    CBG: Recent Labs  Lab  06/27/22 0725 06/27/22 1127 06/27/22 1612 06/27/22 2147 06/28/22 0734  GLUCAP 81 93 147* 187* 119*    Medications:  dialysis solution 2.5% low-MG/low-CA Stopped (06/25/22 1803)   ferric gluconate (FERRLECIT) IVPB 250 mg (06/26/22 1010)    atorvastatin  80 mg Oral Daily   carvedilol  3.125 mg Oral BID WC   Chlorhexidine Gluconate Cloth  6 each Topical Daily   Chlorhexidine Gluconate Cloth  6 each Topical Q0600   gentamicin cream  1 Application Topical Daily   hydrALAZINE  50 mg Oral Q12H   insulin aspart  0-5 Units Subcutaneous QHS   insulin aspart  0-6 Units Subcutaneous TID WC   insulin glargine-yfgn  8 Units Subcutaneous BID   isosorbide mononitrate  30 mg Oral Daily   lactulose  30 g Oral Daily   lidocaine-EPINEPHrine  20 mL Infiltration Once   linagliptin  5 mg Oral Daily   pantoprazole  40 mg Oral Daily  phosphorus  250 mg Oral BID   polyethylene glycol  17 g Oral BID    Outpatient Dialysis Orders: [facility] PD CCPD 5 exchanges 2.5L dwell 1 hr 30 min dwell time No pause, no day dwell  Assessment/Plan: 1 Underdialysis/ PD catheter malfunction/ cognitive decline:  -Pt and wife's primary goal is to remain at home.  CT showed large stool burden and PD catheter in the anterior right lower quadrant adjacent to small bowel loops. Completed bowel regimen but multiple attempts to drain unsuccessful. Still having intermittent constipation but had multiple BMs yesterday and reports stomach feels better.  -TDC placed by IR for hopefully temporary HD while determining cause for PD catheter dysfunction. Had 2 hour HD Monday and tolerated well. Scheduled for HD today.  -Vascular surgery consulted 4/29, offered laparoscopic revision of PD cath on Thursday, wife agrees   2 ESRD: on PD, see above in #1.  On HD temporarily. MWF schedule for now.    3. Hypokalemia - Using high K+ bath with HD. Last K+ improved to 3.5.   4 Hypertension/ volume: overloaded, Ordered lasix to help improve  volume. +LE edema, no respiratory distress. BP improved this AM, on carvedilol 3.125mg  BID and hydralazine 50mg  BID. UF with HD as tolerated.    4. Anemia of ESRD: Last Hgb 10.2. tsat 9%, receiving IV iron course.   5. Metabolic Bone Disease: Corrected Ca high, hold calcitriol for now.  Phos low - looks like he was still getting [place], stopped it yesterday. Last phos 1.4, ordered k phos for 2 doses. Does not really need a renal diet, changed to heart healthy/carb modified. Continued fluid restrictions.    6.  Dispo: Will need TDC placement with arrangement for temporary OP HD if we cannot get PD cath working this admission.      Rogers Blocker, PA-C 06/28/2022, 9:01 AM  [facility] Pager: [phone]

## 2022-06-28 NOTE — Progress Notes (Addendum)
  Progress Note    06/28/2022 8:10 AM * No surgery date entered *  Subjective:  no complaints   Vitals:   06/27/22 2147 06/28/22 0439  BP: (!) 178/86 (!) 160/71  Pulse: 78 69  Resp: 18 18  Temp: 98.7 F (37.1 C) 98.3 F (36.8 C)  SpO2: 97% 99%   Physical Exam: Lungs:  non labored Abdomen:  soft, NT, ND Neurologic: A&O  CBC    Component Value Date/Time   WBC 12.7 (H) 06/27/2022 1101   RBC 3.51 (L) 06/27/2022 1101   HGB 10.2 (L) 06/27/2022 1101   HGB 12.3 (L) 07/05/2021 1045   HCT 33.9 (L) 06/27/2022 1101   HCT 35.9 (L) 07/05/2021 1045   PLT 337 06/27/2022 1101   PLT 192 07/05/2021 1045   MCV 96.6 06/27/2022 1101   MCV 98 (H) 07/05/2021 1045   MCH 29.1 06/27/2022 1101   MCHC 30.1 06/27/2022 1101   RDW 16.6 (H) 06/27/2022 1101   RDW 12.7 07/05/2021 1045   LYMPHSABS 0.9 06/26/2022 0317   MONOABS 0.9 06/26/2022 0317   EOSABS 0.3 06/26/2022 0317   BASOSABS 0.0 06/26/2022 0317    BMET    Component Value Date/Time   NA 145 06/27/2022 1101   NA 143 07/05/2021 1045   K 3.5 06/27/2022 1101   CL 109 06/27/2022 1101   CO2 23 06/27/2022 1101   GLUCOSE 111 (H) 06/27/2022 1101   BUN 34 (H) 06/27/2022 1101   BUN 39 (H) 07/05/2021 1045   CREATININE 3.36 (H) 06/27/2022 1101   CALCIUM 8.9 06/27/2022 1101   GFRNONAA 17 (L) 06/27/2022 1101   GFRAA 16 (L) 04/25/2019 0441    INR    Component Value Date/Time   INR 1.1 04/19/2019 0332     Intake/Output Summary (Last 24 hours) at 06/28/2022 0810 Last data filed at 06/28/2022 0600 Gross per 24 hour  Intake 720 ml  Output 0 ml  Net 720 ml     Assessment/Plan:  89 y.o. male with non functioning PD catheter  Plan is for revision vs replacement of PD catheter in the OR tomorrow 06/29/22 with Dr. Lenell Antu.  Case was discussed with the patient.  NPO past midnight.  Consent ordered.   Emilie Rutter, PA-C [facility] [phone] 06/28/2022 8:10 AM  I have independently interviewed and examined patient  and agree with PA assessment and plan above.  Plan for laparoscopic revision versus replacement of PD catheter tomorrow in OR.  Lorre Opdahl C. Randie Heinz, MD [facility] [place] Office: [phone] Pager: [phone]

## 2022-06-28 NOTE — Progress Notes (Signed)
   06/28/22 1649  Vitals  Temp 98.1 F (36.7 C)  Pulse Rate 74  Resp (!) 21  BP (!) 149/72  SpO2 99 %  O2 Device Room Air  Weight 82.4 kg  Type of Weight Actual  Post Treatment  Dialyzer Clearance Clear  Duration of HD Treatment -hour(s) 3 hour(s)  Liters Processed 54  Fluid Removed (mL) 2 mL  Tolerated HD Treatment Yes   Received patient in bed to unit.  Alert and oriented.  Informed consent signed and in chart.   TX duration: 3hr  Patient tolerated well.  Transported back to the room  Alert, without acute distress.  Hand-off given to patient's nurse.   Access used: [facility] Access issues: none  Total UF removed: 2L  Medication(s) given: none    [name]

## 2022-06-28 NOTE — Progress Notes (Signed)
PROGRESS NOTE    [name]  ZOX:[id number] DOB: 08/24/1933 DOA: 06/22/2022 PCP: Linus Galas, NP   Brief Narrative:  89 year old African-American male history of end-stage renal disease on peritoneal dialysis for last 3 years, moderate aortic stenosis by echocardiogram, type 2 diabetes, coronary disease status post CABG, hypertension, mild cognitive impairment who presented the ER with issues regarding his peritoneal dialysis. it appears that the patient and his wife have not been able to manage his peritoneal dialysis at home.  Wife states over the last month and a half, the patient's had issues with his peritoneal dialysis.  Patient states that he will hang a bag of dialysate fluid on his home cycler.  But when he wakes up in the morning, the bag of dialysate fluid is full and the waste fluid bag is empty.  Wife has noticed the patient having lower extreme edema for the last week.   Patient was sent to the dialysis center, per the note from Dr. Signe Colt, and had his peritoneal cavity filled with dialysis fluid.  They had trouble draining the fluid from the cycler or by manual drainage.  Patient was sent to the ER by Dr. Signe Colt.  Assessment & Plan:   Principal Problem:   Peritoneal dialysis catheter dysfunction (HCC) Active Problems:   Moderate aortic stenosis by prior echocardiogram - mean gradient 24.3 mmHg. peak gradient 42.9 mmHg. Aortic valve area, by VTI measures 1.15  cm.   Essential hypertension   DM2 (diabetes mellitus, type 2) (HCC)   Hypokalemia   ESRD on peritoneal dialysis (HCC)   DNR (do not resuscitate)  ESRD on PD/peritoneal dialysis catheter dysfunction (HCC) CT abdomen shows heavy burden of stool/constipation.  Nephrology on board and opined that he is PD catheter tip appears to be in pelvis and his constipation could be the reason for occlusion.  Patient was started on bowel prep, he had several bowel movements but catheter did not work.  Eventually patient received  temporary dialysis catheter and has been started on intermittent hemodialysis for now.  Vascular surgery has been consulted and patient is scheduled to have his PD catheter repaired or exchanged on Thursday morning.  Appreciate vascular as well as nephrology.   Moderate aortic stenosis by prior echocardiogram - mean gradient 24.3 mmHg. peak gradient 42.9 mmHg. Aortic valve area, by VTI measures 1.15  cm. Chronic. May need to watch out for hypotension during dialysis.   Hypokalemia Resolved.  Per nephrology.   DM2 (diabetes mellitus, type 2) (HCC) Currently on Tradjenta and Semglee 10 units and SSI.  Blood sugar fairly controlled.     Essential hypertension Only slightly elevated, hopefully will improve with intermittent hemodialysis.  Continue current home medications.  DVT prophylaxis: SCDs Start: 06/22/22 2100   Code Status: DNR  Family Communication:  None present at bedside.  Consultants are talking to the patient's wife regularly.  Status is: Inpatient Remains inpatient appropriate because: PD is still not working.  Patient is scheduled to have this fixed on Thursday morning.  Earliest discharge date will be Friday.    Estimated body mass index is 25.83 kg/m as calculated from the following:   Height as of this encounter: 5\' 11"  (1.803 m).   Weight as of this encounter: 84 kg.    Nutritional Assessment: Body mass index is 25.83 kg/m.Marland Kitchen Seen by dietician.  I agree with the assessment and plan as outlined below: Nutrition Status:        . Skin Assessment: I have examined the patient's skin  and I agree with the wound assessment as performed by the wound care RN as outlined below:    Consultants:  Nephrology  Procedures:  None  Antimicrobials:  Anti-infectives (From admission, onward)    Start     Dose/Rate Route Frequency Ordered Stop   06/26/22 0810  ceFAZolin (ANCEF) IVPB 2g/100 mL premix        over 30 Minutes Intravenous Continuous PRN 06/26/22 0812  06/26/22 0830         Subjective: Patient seen and examined.  He has no complaints today. Objective: Vitals:   06/27/22 1639 06/27/22 2147 06/28/22 0439 06/28/22 0919  BP: (!) 144/75 (!) 178/86 (!) 160/71 (!) 160/89  Pulse: 77 78 69 72  Resp: 18 18 18 18   Temp: 98.4 F (36.9 C) 98.7 F (37.1 C) 98.3 F (36.8 C) 98.2 F (36.8 C)  TempSrc:  Oral Oral   SpO2: 98% 97% 99% 100%  Weight:      Height:        Intake/Output Summary (Last 24 hours) at 06/28/2022 1025 Last data filed at 06/28/2022 0600 Gross per 24 hour  Intake 480 ml  Output 0 ml  Net 480 ml    Filed Weights   06/22/22 1458 06/22/22 2103 06/24/22 0500  Weight: 76.4 kg 83.4 kg 84 kg    Examination:  General exam: Appears calm and comfortable  Respiratory system: Clear to auscultation. Respiratory effort normal. Cardiovascular system: S1 & S2 heard, RRR. No JVD, murmurs, rubs, gallops or clicks. No pedal edema. Gastrointestinal system: Abdomen is nondistended, soft and nontender. No organomegaly or masses felt. Normal bowel sounds heard. Central nervous system: Alert and oriented. No focal neurological deficits. Extremities: Symmetric 5 x 5 power. Skin: No rashes, lesions or ulcers.  Psychiatry: Judgement and insight appear poor  Data Reviewed: I have personally reviewed following labs and imaging studies  CBC: Recent Labs  Lab 06/22/22 1507 06/24/22 1006 06/25/22 1029 06/26/22 0317 06/26/22 1600 06/27/22 1101  WBC 8.2 11.5* 12.5* 9.3 8.8 12.7*  NEUTROABS 6.1  --   --  7.0  --   --   HGB 9.6* 9.0* 9.3* 8.2* 8.1* 10.2*  HCT 31.9* 28.4* 29.5* 26.9* 26.2* 33.9*  MCV 96.4 93.1 94.6 95.4 96.0 96.6  PLT 325 313 360 291 272 337    Basic Metabolic Panel: Recent Labs  Lab 06/23/22 1131 06/24/22 1006 06/25/22 1029 06/26/22 1600 06/27/22 1101  NA 143 142 146* 143 145  K 3.0* 3.1* 3.5 3.1* 3.5  CL 111 110 110 112* 109  CO2 23 23 24 25 23   GLUCOSE 216* 232* 105* 117* 111*  BUN 50* 50* 50* 42* 34*   CREATININE 3.85* 3.78* 4.12* 3.70* 3.36*  CALCIUM 9.5 9.3 9.4 8.3* 8.9  MG 2.0  --   --   --   --   PHOS  --  1.8* 1.6* 1.4* 1.4*    GFR: Estimated Creatinine Clearance: 16.8 mL/min (A) (by C-G formula based on SCr of 3.36 mg/dL (H)). Liver Function Tests: Recent Labs  Lab 06/22/22 1507 06/23/22 1131 06/24/22 1006 06/25/22 1029 06/26/22 1600 06/27/22 1101  AST 21 29  --   --   --   --   ALT 15 15  --   --   --   --   ALKPHOS 51 53  --   --   --   --   BILITOT 0.6 0.8  --   --   --   --   PROT  7.0 7.4  --   --   --   --   ALBUMIN 2.6* 2.7* 2.4* 2.5* 2.0* 2.5*    Recent Labs  Lab 06/22/22 1507  LIPASE 37    No results for input(s): "AMMONIA" in the last 168 hours. Coagulation Profile: No results for input(s): "INR", "PROTIME" in the last 168 hours. Cardiac Enzymes: No results for input(s): "CKTOTAL", "CKMB", "CKMBINDEX", "TROPONINI" in the last 168 hours. BNP (last 3 results) No results for input(s): "PROBNP" in the last 8760 hours. HbA1C: No results for input(s): "HGBA1C" in the last 72 hours.  CBG: Recent Labs  Lab 06/27/22 0725 06/27/22 1127 06/27/22 1612 06/27/22 2147 06/28/22 0734  GLUCAP 81 93 147* 187* 119*    Lipid Profile: No results for input(s): "CHOL", "HDL", "LDLCALC", "TRIG", "CHOLHDL", "LDLDIRECT" in the last 72 hours. Thyroid Function Tests: No results for input(s): "TSH", "T4TOTAL", "FREET4", "T3FREE", "THYROIDAB" in the last 72 hours. Anemia Panel: No results for input(s): "VITAMINB12", "FOLATE", "FERRITIN", "TIBC", "IRON", "RETICCTPCT" in the last 72 hours.  Sepsis Labs: No results for input(s): "PROCALCITON", "LATICACIDVEN" in the last 168 hours.  No results found for this or any previous visit (from the past 240 hour(s)).   Radiology Studies: No results found.  Scheduled Meds:  atorvastatin  80 mg Oral Daily   carvedilol  3.125 mg Oral BID WC   Chlorhexidine Gluconate Cloth  6 each Topical Daily   Chlorhexidine Gluconate  Cloth  6 each Topical Q0600   gentamicin cream  1 Application Topical Daily   hydrALAZINE  50 mg Oral Q12H   insulin aspart  0-5 Units Subcutaneous QHS   insulin aspart  0-6 Units Subcutaneous TID WC   insulin glargine-yfgn  8 Units Subcutaneous BID   isosorbide mononitrate  30 mg Oral Daily   lactulose  30 g Oral Daily   lidocaine-EPINEPHrine  20 mL Infiltration Once   linagliptin  5 mg Oral Daily   pantoprazole  40 mg Oral Daily   phosphorus  250 mg Oral BID   polyethylene glycol  17 g Oral BID   Continuous Infusions:  dialysis solution 2.5% low-MG/low-CA Stopped (06/25/22 1803)   ferric gluconate (FERRLECIT) IVPB 250 mg (06/28/22 0912)     LOS: 5 days   Hughie Closs, MD [facility]  06/28/2022, 10:25 AM   *Please note that this is a verbal dictation therefore any spelling or grammatical errors are due to the "Dragon Medical One" system interpretation.  Please page via Amion and do not message via secure chat for urgent patient care matters. Secure chat can be used for non urgent patient care matters.  How to contact the [facility] Attending or Consulting provider 7A - 7P or covering provider during after hours 7P -7A, for this patient?  Check the care team in [facility] and look for a) attending/consulting TRH provider listed and b) the [facility] team listed. Page or secure chat 7A-7P. Log into [email or web address] and use [place]'s universal password to access. If you do not have the password, please contact the hospital operator. Locate the [facility] provider you are looking for under [facility] and page to a number that you can be directly reached. If you still have difficulty reaching the provider, please page the [facility] (Director on Call) for the Hospitalists listed on amion for assistance.

## 2022-06-29 ENCOUNTER — Inpatient Hospital Stay (HOSPITAL_COMMUNITY): Payer: Medicare Other | Admitting: Anesthesiology

## 2022-06-29 ENCOUNTER — Encounter (HOSPITAL_COMMUNITY): Payer: Self-pay | Admitting: Internal Medicine

## 2022-06-29 ENCOUNTER — Encounter (HOSPITAL_COMMUNITY)
Admission: EM | Disposition: A | Discharge status: Home / Self Care | Payer: Self-pay | Source: Home / Self Care | Attending: Family Medicine

## 2022-06-29 DIAGNOSIS — N186 End stage renal disease: Secondary | ICD-10-CM

## 2022-06-29 DIAGNOSIS — Z992 Dependence on renal dialysis: Secondary | ICD-10-CM

## 2022-06-29 DIAGNOSIS — I132 Hypertensive heart and chronic kidney disease with heart failure and with stage 5 chronic kidney disease, or end stage renal disease: Secondary | ICD-10-CM

## 2022-06-29 DIAGNOSIS — T85611A Breakdown (mechanical) of intraperitoneal dialysis catheter, initial encounter: Secondary | ICD-10-CM | POA: Diagnosis not present

## 2022-06-29 DIAGNOSIS — T85691A Other mechanical complication of intraperitoneal dialysis catheter, initial encounter: Secondary | ICD-10-CM

## 2022-06-29 DIAGNOSIS — T82898A Other specified complication of vascular prosthetic devices, implants and grafts, initial encounter: Secondary | ICD-10-CM

## 2022-06-29 DIAGNOSIS — I509 Heart failure, unspecified: Secondary | ICD-10-CM

## 2022-06-29 DIAGNOSIS — I2510 Atherosclerotic heart disease of native coronary artery without angina pectoris: Secondary | ICD-10-CM

## 2022-06-29 DIAGNOSIS — D631 Anemia in chronic kidney disease: Secondary | ICD-10-CM

## 2022-06-29 DIAGNOSIS — K660 Peritoneal adhesions (postprocedural) (postinfection): Secondary | ICD-10-CM

## 2022-06-29 HISTORY — PX: LAPAROSCOPIC LYSIS OF ADHESIONS: SHX5905

## 2022-06-29 HISTORY — PX: LAPAROSCOPY: SHX197

## 2022-06-29 HISTORY — PX: CAPD REVISION: SHX5260

## 2022-06-29 LAB — CBC
HCT: 31.2 % — ABNORMAL LOW (ref 39.0–52.0)
Hemoglobin: 9.4 g/dL — ABNORMAL LOW (ref 13.0–17.0)
MCH: 29.1 pg (ref 26.0–34.0)
MCHC: 30.1 g/dL (ref 30.0–36.0)
MCV: 96.6 fL (ref 80.0–100.0)
Platelets: 290 10*3/uL (ref 150–400)
RBC: 3.23 MIL/uL — ABNORMAL LOW (ref 4.22–5.81)
RDW: 17.5 % — ABNORMAL HIGH (ref 11.5–15.5)
WBC: 9.2 10*3/uL (ref 4.0–10.5)
nRBC: 0.5 % — ABNORMAL HIGH (ref 0.0–0.2)

## 2022-06-29 LAB — RENAL FUNCTION PANEL
Albumin: 2.1 g/dL — ABNORMAL LOW (ref 3.5–5.0)
Anion gap: 9 (ref 5–15)
BUN: 18 mg/dL (ref 8–23)
CO2: 25 mmol/L (ref 22–32)
Calcium: 7.7 mg/dL — ABNORMAL LOW (ref 8.9–10.3)
Chloride: 106 mmol/L (ref 98–111)
Creatinine, Ser: 2.98 mg/dL — ABNORMAL HIGH (ref 0.61–1.24)
GFR, Estimated: 20 mL/min — ABNORMAL LOW (ref >60)
Glucose, Bld: 86 mg/dL (ref 70–99)
Phosphorus: 2.5 mg/dL (ref 2.5–4.6)
Potassium: 3.3 mmol/L — ABNORMAL LOW (ref 3.5–5.1)
Sodium: 140 mmol/L (ref 135–145)

## 2022-06-29 LAB — GLUCOSE, CAPILLARY
Glucose-Capillary: 52 mg/dL — ABNORMAL LOW (ref 70–99)
Glucose-Capillary: 63 mg/dL — ABNORMAL LOW (ref 70–99)
Glucose-Capillary: 89 mg/dL (ref 70–99)
Glucose-Capillary: 101 mg/dL — ABNORMAL HIGH (ref 70–99)
Glucose-Capillary: 102 mg/dL — ABNORMAL HIGH (ref 70–99)
Glucose-Capillary: 87 mg/dL (ref 70–99)
Glucose-Capillary: 80 mg/dL (ref 70–99)

## 2022-06-29 LAB — POCT I-STAT, CHEM 8
BUN: 25 mg/dL — ABNORMAL HIGH (ref 8–23)
Calcium, Ion: 1.03 mmol/L — ABNORMAL LOW (ref 1.15–1.40)
Chloride: 102 mmol/L (ref 98–111)
Creatinine, Ser: 3 mg/dL — ABNORMAL HIGH (ref 0.61–1.24)
Glucose, Bld: 131 mg/dL — ABNORMAL HIGH (ref 70–99)
HCT: 30 % — ABNORMAL LOW (ref 39.0–52.0)
Hemoglobin: 10.2 g/dL — ABNORMAL LOW (ref 13.0–17.0)
Potassium: 4.1 mmol/L (ref 3.5–5.1)
Sodium: 140 mmol/L (ref 135–145)
TCO2: 30 mmol/L (ref 22–32)

## 2022-06-29 LAB — SURGICAL PCR SCREEN
MRSA, PCR: NEGATIVE
Staphylococcus aureus: POSITIVE — AB

## 2022-06-29 SURGERY — LAPAROSCOPIC REVISION CONTINUOUS AMBULATORY PERITONEAL DIALYSIS  (CAPD) CATHETER
Anesthesia: General | Site: Abdomen

## 2022-06-29 MED ORDER — CEFAZOLIN SODIUM-DEXTROSE 2-3 GM-%(50ML) IV SOLR
INTRAVENOUS | Status: DC | PRN
  Administered 2022-06-29: 2 g via INTRAVENOUS

## 2022-06-29 MED ORDER — GLUCAGON HCL RDNA (DIAGNOSTIC) 1 MG IJ SOLR
1.0000 mg | INTRAMUSCULAR | Status: AC
  Administered 2022-06-29: 1 mg via INTRAMUSCULAR
  Filled 2022-06-29: qty 1

## 2022-06-29 MED ORDER — CEFAZOLIN SODIUM 1 G IJ SOLR
INTRAMUSCULAR | Status: AC
  Filled 2022-06-29: qty 20

## 2022-06-29 MED ORDER — PROPOFOL 10 MG/ML IV BOLUS
INTRAVENOUS | Status: DC | PRN
  Administered 2022-06-29: 100 mg via INTRAVENOUS

## 2022-06-29 MED ORDER — ONDANSETRON HCL 4 MG/2ML IJ SOLN
INTRAMUSCULAR | Status: DC | PRN
  Administered 2022-06-29: 4 mg via INTRAVENOUS

## 2022-06-29 MED ORDER — SODIUM CHLORIDE 0.9 % IV SOLN: INTRAVENOUS | Status: DC

## 2022-06-29 MED ORDER — CHLORHEXIDINE GLUCONATE CLOTH 2 % EX PADS
6.0000 | MEDICATED_PAD | Freq: Every day | CUTANEOUS | Status: DC
  Administered 2022-06-30 – 2022-07-02 (×3): 6 via TOPICAL

## 2022-06-29 MED ORDER — MUPIROCIN 2 % EX OINT
1.0000 | TOPICAL_OINTMENT | Freq: Two times a day (BID) | CUTANEOUS | Status: AC
  Administered 2022-06-29 – 2022-07-02 (×8): 1 via NASAL
  Filled 2022-06-29: qty 22

## 2022-06-29 MED ORDER — DEXTROSE 50 % IV SOLN
25.0000 g | INTRAVENOUS | Status: AC
  Administered 2022-06-29: 25 g via INTRAVENOUS
  Filled 2022-06-29: qty 50

## 2022-06-29 MED ORDER — CHLORHEXIDINE GLUCONATE 0.12 % MT SOLN: 15.0000 mL | Freq: Once | OROMUCOSAL | Status: AC

## 2022-06-29 MED ORDER — FENTANYL CITRATE (PF) 250 MCG/5ML IJ SOLN
INTRAMUSCULAR | Status: DC | PRN
  Administered 2022-06-29: 100 ug via INTRAVENOUS

## 2022-06-29 MED ORDER — BUPIVACAINE HCL (PF) 0.25 % IJ SOLN
INTRAMUSCULAR | Status: AC
  Filled 2022-06-29: qty 30

## 2022-06-29 MED ORDER — SUGAMMADEX SODIUM 200 MG/2ML IV SOLN
INTRAVENOUS | Status: DC | PRN
  Administered 2022-06-29: 200 mg via INTRAVENOUS

## 2022-06-29 MED ORDER — FENTANYL CITRATE (PF) 100 MCG/2ML IJ SOLN: 25.0000 ug | INTRAMUSCULAR | Status: DC | PRN

## 2022-06-29 MED ORDER — FENTANYL CITRATE (PF) 250 MCG/5ML IJ SOLN
INTRAMUSCULAR | Status: AC
  Filled 2022-06-29: qty 5

## 2022-06-29 MED ORDER — ORAL CARE MOUTH RINSE: 15.0000 mL | Freq: Once | OROMUCOSAL | Status: AC

## 2022-06-29 MED ORDER — ONDANSETRON HCL 4 MG/2ML IJ SOLN: 4.0000 mg | Freq: Once | INTRAMUSCULAR | Status: DC | PRN

## 2022-06-29 MED ORDER — LIDOCAINE 2% (20 MG/ML) 5 ML SYRINGE
INTRAMUSCULAR | Status: AC
  Filled 2022-06-29: qty 5

## 2022-06-29 MED ORDER — LIDOCAINE 2% (20 MG/ML) 5 ML SYRINGE
INTRAMUSCULAR | Status: DC | PRN
  Administered 2022-06-29: 60 mg via INTRAVENOUS

## 2022-06-29 MED ORDER — PROPOFOL 10 MG/ML IV BOLUS
INTRAVENOUS | Status: AC
  Filled 2022-06-29: qty 20

## 2022-06-29 MED ORDER — ONDANSETRON HCL 4 MG/2ML IJ SOLN
INTRAMUSCULAR | Status: AC
  Filled 2022-06-29: qty 2

## 2022-06-29 MED ORDER — CHLORHEXIDINE GLUCONATE CLOTH 2 % EX PADS
6.0000 | MEDICATED_PAD | Freq: Every day | CUTANEOUS | Status: DC
  Administered 2022-06-29: 6 via TOPICAL

## 2022-06-29 MED ORDER — 0.9 % SODIUM CHLORIDE (POUR BTL) OPTIME
TOPICAL | Status: DC | PRN
  Administered 2022-06-29: 1000 mL

## 2022-06-29 MED ORDER — HEPARIN 6000 UNIT IRRIGATION SOLUTION
Status: AC
  Filled 2022-06-29: qty 500

## 2022-06-29 MED ORDER — ROCURONIUM BROMIDE 10 MG/ML (PF) SYRINGE
PREFILLED_SYRINGE | INTRAVENOUS | Status: DC | PRN
  Administered 2022-06-29: 50 mg via INTRAVENOUS

## 2022-06-29 MED ORDER — ROCURONIUM BROMIDE 10 MG/ML (PF) SYRINGE
PREFILLED_SYRINGE | INTRAVENOUS | Status: AC
  Filled 2022-06-29: qty 10

## 2022-06-29 MED ORDER — SODIUM CHLORIDE 0.9 % IR SOLN
Status: DC | PRN
  Administered 2022-06-29: 1

## 2022-06-29 MED ORDER — EPINEPHRINE PF 1 MG/ML IJ SOLN
INTRAMUSCULAR | Status: AC
  Filled 2022-06-29: qty 1

## 2022-06-29 MED ORDER — CHLORHEXIDINE GLUCONATE 0.12 % MT SOLN
OROMUCOSAL | Status: AC
  Administered 2022-06-29: 15 mL via OROMUCOSAL
  Filled 2022-06-29: qty 15

## 2022-06-29 SURGICAL SUPPLY — 62 items
ADAPTER TITANIUM MEDIONICS (MISCELLANEOUS) ×1 IMPLANT
ADH SKN CLS APL DERMABOND .7 (GAUZE/BANDAGES/DRESSINGS) ×1
ADPR DLYS CATH STRL LF DISP (MISCELLANEOUS) ×1
APL PRP STRL LF DISP 70% ISPRP (MISCELLANEOUS) ×1
APPLIER CLIP 5 13 M/L LIGAMAX5 (MISCELLANEOUS)
APR CLP MED LRG 5 ANG JAW (MISCELLANEOUS)
BAG DECANTER FOR FLEXI CONT (MISCELLANEOUS) ×1 IMPLANT
BIOPATCH RED 1 DISK 7.0 (GAUZE/BANDAGES/DRESSINGS) ×1 IMPLANT
BLADE CLIPPER SURG (BLADE) IMPLANT
BLADE SURG 11 STRL SS (BLADE) ×1 IMPLANT
CATH EXTENDED DIALYSIS (CATHETERS) IMPLANT
CHLORAPREP W/TINT 26 (MISCELLANEOUS) ×1 IMPLANT
CLIP APPLIE 5 13 M/L LIGAMAX5 (MISCELLANEOUS) IMPLANT
CNTNR URN SCR LID CUP LEK RST (MISCELLANEOUS) IMPLANT
CONT SPEC 4OZ STRL OR WHT (MISCELLANEOUS) ×1
COVER SURGICAL LIGHT HANDLE (MISCELLANEOUS) ×1 IMPLANT
DERMABOND ADVANCED .7 DNX12 (GAUZE/BANDAGES/DRESSINGS) ×1 IMPLANT
DEVICE TROCAR PUNCTURE CLOSURE (ENDOMECHANICALS) ×1 IMPLANT
DRSG IV TEGADERM 3.5X4.5 STRL (GAUZE/BANDAGES/DRESSINGS) IMPLANT
DRSG TEGADERM 4X4.75 (GAUZE/BANDAGES/DRESSINGS) ×3 IMPLANT
ELECT REM PT RETURN 9FT ADLT (ELECTROSURGICAL) ×1
ELECTRODE REM PT RTRN 9FT ADLT (ELECTROSURGICAL) ×1 IMPLANT
GAUZE SPONGE 4X4 12PLY STRL (GAUZE/BANDAGES/DRESSINGS) ×1 IMPLANT
GLOVE INDICATOR 6.5 STRL GRN (GLOVE) ×1 IMPLANT
GLOVE SURG UNDER LTX SZ7.5 (GLOVE) ×1 IMPLANT
GOWN STRL REUS W/ TWL LRG LVL3 (GOWN DISPOSABLE) ×2 IMPLANT
GOWN STRL REUS W/ TWL XL LVL3 (GOWN DISPOSABLE) ×1 IMPLANT
GOWN STRL REUS W/TWL LRG LVL3 (GOWN DISPOSABLE) ×2
GOWN STRL REUS W/TWL XL LVL3 (GOWN DISPOSABLE) ×1
GRASPER SUT TROCAR 14GX15 (MISCELLANEOUS) ×1 IMPLANT
IRRIG SUCT STRYKERFLOW 2 WTIP (MISCELLANEOUS)
IRRIGATION SUCT STRKRFLW 2 WTP (MISCELLANEOUS) IMPLANT
IV NS 1000ML (IV SOLUTION) ×1
IV NS 1000ML BAXH (IV SOLUTION) ×1 IMPLANT
KIT BASIN OR (CUSTOM PROCEDURE TRAY) ×1 IMPLANT
KIT TURNOVER KIT B (KITS) ×1 IMPLANT
NDL INSUFFLATION 14GA 120MM (NEEDLE) ×1 IMPLANT
NEEDLE INSUFFLATION 14GA 120MM (NEEDLE) ×1 IMPLANT
NS IRRIG 1000ML POUR BTL (IV SOLUTION) ×1 IMPLANT
PAD ARMBOARD 7.5X6 YLW CONV (MISCELLANEOUS) ×2 IMPLANT
PENCIL BUTTON HOLSTER BLD 10FT (ELECTRODE) IMPLANT
POWDER SURGICEL 3.0 GRAM (HEMOSTASIS) IMPLANT
SCISSORS LAP 5X35 DISP (ENDOMECHANICALS) IMPLANT
SET CYSTO W/LG BORE CLAMP LF (SET/KITS/TRAYS/PACK) ×1 IMPLANT
SET EXT 12IN DIALYSIS STAY-SAF (MISCELLANEOUS) ×1 IMPLANT
SET TUBE SMOKE EVAC HIGH FLOW (TUBING) ×1 IMPLANT
SLEEVE Z-THREAD 5X100MM (TROCAR) ×2 IMPLANT
SPIKE FLUID TRANSFER (MISCELLANEOUS) ×1 IMPLANT
STYLET FALLER (MISCELLANEOUS) ×1 IMPLANT
STYLET FALLER MEDIONICS (MISCELLANEOUS) ×1 IMPLANT
SUT MNCRL AB 4-0 PS2 18 (SUTURE) ×1 IMPLANT
SUT PROLENE 0 SH 30 (SUTURE) ×2 IMPLANT
SUT SILK 0 TIES 10X30 (SUTURE) ×1 IMPLANT
SUT VICRYL 3 0 (SUTURE) IMPLANT
TOWEL GREEN STERILE (TOWEL DISPOSABLE) ×1 IMPLANT
TOWEL GREEN STERILE FF (TOWEL DISPOSABLE) ×1 IMPLANT
TRAY LAPAROSCOPIC MC (CUSTOM PROCEDURE TRAY) ×1 IMPLANT
TROCAR 11X100 Z THREAD (TROCAR) IMPLANT
TROCAR 5MMX150MM (TROCAR) ×1 IMPLANT
TROCAR XCEL NON-BLD 5MMX100MML (ENDOMECHANICALS) ×1 IMPLANT
TROCAR Z-THREAD OPTICAL 5X100M (TROCAR) ×1 IMPLANT
WATER STERILE IRR 1000ML POUR (IV SOLUTION) ×1 IMPLANT

## 2022-06-29 NOTE — Op Note (Signed)
DATE OF SERVICE: 06/29/2022  PATIENT:  [name]  89 y.o. male  PRE-OPERATIVE DIAGNOSIS:  ESRD; malfunctioning peritoneal dialysis catheter  POST-OPERATIVE DIAGNOSIS:  Same  PROCEDURE:   1) diagnostic laparoscopy 2) laparoscopic lysis of adhesions 3) revision of peritoneal dialysis catheter  SURGEON:  Surgeon(s) and Role:    * Leonie Douglas, MD - Primary  ASSISTANT: Natasha Bence, RNFA  An experienced assistant was required given the complexity of this procedure and the standard of surgical care. My assistant helped with exposure through counter tension, suctioning, ligation and retraction to better visualize the surgical field.  My assistant expedited sewing during the case by following my sutures. Wherever I use the term "we" in the report, my assistant actively helped me with that portion of the procedure.  ANESTHESIA:   general  EBL: minimal  BLOOD ADMINISTERED:none  DRAINS: PD catheter exiting left abdomen  LOCAL MEDICATIONS USED:  NONE  SPECIMEN:  pigtail portion of prior catheter for culture / gram stain  COUNTS: confirmed correct.  TOURNIQUET:  none  PATIENT DISPOSITION:  PACU - hemodynamically stable.   Delay start of Pharmacological VTE agent (>24hrs) due to surgical blood loss or risk of bleeding: no  INDICATION FOR PROCEDURE: [name] is a 89 y.o. male with ESRD previously dialyzing via peritoneal dialysis catheter. This has stopped working. CT scan shows pigtail portion of catheter stuck in right lower quadrant. After careful discussion of risks, benefits, and alternatives the patient was offered diagnostic laparoscopy. The patient's family understood and wished to proceed.  OPERATIVE FINDINGS:     DESCRIPTION OF PROCEDURE: After identification of the patient in the pre-operative holding area, the patient was transferred to the operating room. The patient was positioned supine on the operating room table. Anesthesia was induced. The abdomen  was prepped and draped in standard fashion. A surgical pause was performed confirming correct patient, procedure, and operative location.  A Veress needle was introduced into the abdomen at Palmer's point, immediately below the left costal margin.  A saline drop test was used to confirm intra-abdominal position.  The Veress needle was connected to insufflation tubing and insufflation initiated.  A low opening pressure and good flow rate were noted.  A 5 mm trocar was introduced into the right upper quadrant using Visi-View technique.  The obturator was removed and the abdomen inspected with a 30 degree angled laparoscope.  No evidence of Veress needle injury was noted.  An additional 5 mm trocar was inserted a handsbreadth away to facilitate the case.  Loose adhesions were seen throughout the abdomen. I was able to bluntly take these down. The working portion of the catheter was found in a nest of adhesed small bowel in the right lower quadrant as predicted by CT scan.   I made a cutdown over the prior christmas tree adapter and controlled this proximally and distally with hemostats. The distal, working portion of the catheter was removed from the chirstmas tree adapter. The cuff was freed from its attachments. The working portion of the catheter was completely removed intact. This was passed off the table for pathologic evaluation.   The abdomen was desufflated. A new working portion of the eritoneal catheter was measured across the abdominal wall surface.  An advantage 5 mm trocar was then used to create a skiving path through the anterior rectus fascia, rectus muscle, and peritoneum.  The laparoscopic trocar entered in the suprapubic abdomen directing towards the pelvis.  The working end of the catheter was  delivered through the trocar.  The cuff was brought into the abdomen and then placed back into the rectus muscle.  The abdomen was desufflated again.  he 2 pieces of catheter were then joined using a  Christmas tree adapter.  This was secured in place with 2 interrupted sutures of 0 Prolene.    The catheter was connected to cystoscopy tubing.  The catheter flushed and drained without any difficulty.  The trochars were removed.  All incisions were closed with interrupted 4-0 Monocryl sutures.  Dermabond was applied.  A sterile bandage was applied to the peritoneal dialysis catheter.   Upon completion of the case instrument and sharps counts were confirmed correct. The patient was transferred to the PACU in good condition. I was present for all portions of the procedure.  FOLLOW UP PLAN: catheter may be flushed tomorrow. Follow up with me as needed if catheter not working. I would not recommend further efforts at peritoneal dialysis.   Rande Brunt. Lenell Antu, MD [facility] Phone Number: [phone] 06/29/2022 11:03 AM

## 2022-06-29 NOTE — Progress Notes (Signed)
[facility] Progress Note   Subjective:   PD was confused and hypoglycemic overnight, improved this AM. At time of my exam, he is alert, calm and pleasant. Pt and wife both agreed to surgery today. He denies SOB, CP, dizziness and nausea.   Objective Vitals:   06/28/22 1705 06/28/22 2140 06/29/22 0406 06/29/22 0932  BP: (!) 149/73 (!) 159/67 (!) 149/69 125/64  Pulse: 77 77 78 71  Resp: 18 18 18 11   Temp: 98.1 F (36.7 C) 98 F (36.7 C) 98.1 F (36.7 C) 97.9 F (36.6 C)  TempSrc:  Oral Oral Oral  SpO2: 99% 97% 99% 93%  Weight:      Height:       Physical Exam General: Alert male in NAD Heart: RRR, no murmurs, rubs or gallops Lungs: CTA bilaterally Abdomen: Soft, non-distended, +BS Extremities: 2+ pitting edema b/l lower extremities Dialysis Access: PD cath in abdomen  Additional Objective Labs: Basic Metabolic Panel: Recent Labs  Lab 06/25/22 1029 06/26/22 1600 06/27/22 1101 06/29/22 0938  NA 146* 143 145 140  K 3.5 3.1* 3.5 4.1  CL 110 112* 109 102  CO2 24 25 23   --   GLUCOSE 105* 117* 111* 131*  BUN 50* 42* 34* 25*  CREATININE 4.12* 3.70* 3.36* 3.00*  CALCIUM 9.4 8.3* 8.9  --   PHOS 1.6* 1.4* 1.4*  --    Liver Function Tests: Recent Labs  Lab 06/22/22 1507 06/23/22 1131 06/24/22 1006 06/25/22 1029 06/26/22 1600 06/27/22 1101  AST 21 29  --   --   --   --   ALT 15 15  --   --   --   --   ALKPHOS 51 53  --   --   --   --   BILITOT 0.6 0.8  --   --   --   --   PROT 7.0 7.4  --   --   --   --   ALBUMIN 2.6* 2.7*   < > 2.5* 2.0* 2.5*   < > = values in this interval not displayed.   Recent Labs  Lab 06/22/22 1507  LIPASE 37   CBC: Recent Labs  Lab 06/22/22 1507 06/24/22 1006 06/25/22 1029 06/26/22 0317 06/26/22 1600 06/27/22 1101 06/29/22 0938  WBC 8.2 11.5* 12.5* 9.3 8.8 12.7*  --   NEUTROABS 6.1  --   --  7.0  --   --   --   HGB 9.6* 9.0* 9.3* 8.2* 8.1* 10.2* 10.2*  HCT 31.9* 28.4* 29.5* 26.9* 26.2* 33.9* 30.0*  MCV  96.4 93.1 94.6 95.4 96.0 96.6  --   PLT 325 313 360 291 272 337  --     CBG: Recent Labs  Lab 06/28/22 1700 06/28/22 2142 06/29/22 0717 06/29/22 0824 06/29/22 0837  GLUCAP 93 105* 52* 63* 89    Medications:  sodium chloride 10 mL/hr at 06/29/22 0947   [MAR Hold] dialysis solution 2.5% low-MG/low-CA Stopped (06/25/22 1803)   [MAR Hold] ferric gluconate (FERRLECIT) IVPB 250 mg (06/28/22 0912)    [MAR Hold] atorvastatin  80 mg Oral Daily   [MAR Hold] carvedilol  3.125 mg Oral BID WC   [MAR Hold] Chlorhexidine Gluconate Cloth  6 each Topical Daily   [MAR Hold] Chlorhexidine Gluconate Cloth  6 each Topical Q0600   [MAR Hold] Chlorhexidine Gluconate Cloth  6 each Topical Q0600   [MAR Hold] gentamicin cream  1 Application Topical Daily   [MAR Hold] hydrALAZINE  50 mg  Oral Q12H   [MAR Hold] insulin aspart  0-5 Units Subcutaneous QHS   [MAR Hold] insulin aspart  0-6 Units Subcutaneous TID WC   [MAR Hold] insulin glargine-yfgn  8 Units Subcutaneous BID   [MAR Hold] isosorbide mononitrate  30 mg Oral Daily   [MAR Hold] lactulose  30 g Oral Daily   [MAR Hold] lidocaine-EPINEPHrine  20 mL Infiltration Once   [MAR Hold] linagliptin  5 mg Oral Daily   [MAR Hold] mupirocin ointment  1 Application Nasal BID   [MAR Hold] pantoprazole  40 mg Oral Daily   [MAR Hold] polyethylene glycol  17 g Oral BID    Outpatient Dialysis Orders: [facility] PD CCPD 5 exchanges 2.5L dwell 1 hr 30 min dwell time No pause, no day dwell    Assessment/Plan: 1 Underdialysis/ PD catheter malfunction/ cognitive decline:  -Pt and wife's primary goal is to remain at home.  CT showed large stool burden and PD catheter in the anterior right lower quadrant adjacent to small bowel loops. Completed bowel regimen but multiple attempts to drain unsuccessful. Still having intermittent constipation but had multiple BMs yesterday and reports stomach feels better.  -TDC placed by IR for hopefully temporary HD while determining cause  for PD catheter dysfunction. Had 2 hour HD Monday and tolerated well. Scheduled for HD today.  -Vascular surgery consulted 4/29, offered laparoscopic revision of PD cath today, wife agrees   2 ESRD: on PD, see above in #1.  On HD temporarily. MWF schedule for now. I have placed HD orders for tomorrow, will be beneficial to try to optimize volume status with HD. Ability to resume PD will depend on outcome of surgery today.    3. Hypokalemia - Using high K+ bath with HD. Last K+ improved to 4.1.   4 Hypertension/ volume: overloaded, Ordered lasix to help improve volume. +LE edema, no respiratory distress. BP improved this AM, on carvedilol 3.125mg  BID and hydralazine 50mg  BID. UF with HD as tolerated.    4. Anemia of ESRD: Last Hgb 10.2. tsat 9%, receiving IV iron course.   5. Metabolic Bone Disease: Corrected Ca high, hold calcitriol for now.  Phos low - looks like he was still getting [place], stopped it yesterday. Last phos 1.4, ordered k phos for 2 doses. Does not really need a renal diet, changed to heart healthy/carb modified. Continued fluid restrictions. Unfortunately he refused labs this AM, will try to get a RFP this afternoon if he is agreeable.    6.  Dispo: Will need TDC placement with arrangement for temporary OP HD if we cannot get PD cath working this admission.   Rogers Blocker, PA-C 06/29/2022, 10:28 AM  [facility] Pager: [phone]

## 2022-06-29 NOTE — Progress Notes (Signed)
Patient very confused and wanted the police to help him out leave this place. Reoriented and was willing to sit on the bed. Remains awake at present. Needs attended. Will continue to monitor.

## 2022-06-29 NOTE — Progress Notes (Signed)
Patient refused labs. Pt remains very confused.

## 2022-06-29 NOTE — Progress Notes (Addendum)
     Patient unable to state place, he knows who he is.  He seems somewhat confused. RN plans to get consent today for OR with his wife.  NPO order in place.    Plan for laparoscopic revision versus replacement of PD catheter.  Mosetta Pigeon PA-C  VASCULAR STAFF ADDENDUM: I have independently interviewed and examined the patient. I agree with the above.  Plan diagnostic laparoscopy, possible peritoneal dialysis catheter revision today in OR.  Rande Brunt. Lenell Antu, MD [facility] Phone Number: [phone] 06/29/2022 9:18 AM

## 2022-06-29 NOTE — Progress Notes (Signed)
Called wife for consent no answer. Will try later.

## 2022-06-29 NOTE — Transfer of Care (Signed)
Immediate Anesthesia Transfer of Care Note  [name]  Procedure(s) Performed: REPLACEMENT OF PERITONEAL DIALYSIS CATHETER (Abdomen) LAPAROSCOPY DIAGNOSTIC (Abdomen) LAPAROSCOPIC LYSIS OF ADHESIONS (Abdomen)  Patient Location: PACU  Anesthesia Type:General  Level of Consciousness: responds to stimulation  Airway & Oxygen Therapy: Patient Spontanous Breathing  Post-op Assessment: Report given to RN and Post -op Vital signs reviewed and stable  Post vital signs: Reviewed and stable  Last Vitals:  Vitals Value Taken Time  BP 112/62 06/29/22 1118  Temp    Pulse 58 06/29/22 1119  Resp 16 06/29/22 1119  SpO2 97 % 06/29/22 1119  Vitals shown include unvalidated device data.  Last Pain:  Vitals:   06/29/22 0932  TempSrc: Oral  PainSc: 0-No pain      Patients Stated Pain Goal: 3 (06/29/22 0932)  Complications: No notable events documented.

## 2022-06-29 NOTE — Anesthesia Preprocedure Evaluation (Addendum)
Anesthesia Evaluation  Patient identified by MRN, date of birth, ID band Patient awake    Reviewed: Allergy & Precautions, NPO status , Patient's Chart, lab work & pertinent test results, reviewed documented beta blocker date and time   Airway Mallampati: II  TM Distance: >3 FB Neck ROM: Full    Dental  (+) Teeth Intact, Dental Advisory Given   Pulmonary pneumonia, resolved, former smoker 20 pack year history, quit smoking 1974   Pulmonary exam normal breath sounds clear to auscultation       Cardiovascular hypertension, Pt. on medications and Pt. on home beta blockers + CAD, + CABG, + Peripheral Vascular Disease and +CHF  Normal cardiovascular exam+ Valvular Problems/Murmurs AS  Rhythm:Regular Rate:Normal  ACS 04/18/19 CABG 1980s Moderate AS 11/11/21  EKG 06/23/22 NSR, borderline repolarization abn  Echo 11/11/21 Aortic Valve: The aortic valve is tricuspid. There is moderate  calcification of the aortic valve. There is moderate thickening of the  aortic valve. Aortic valve regurgitation is mild. Aortic regurgitation PHT  measures 497 msec. Moderate aortic stenosis  is present. Aortic valve mean gradient measures 24.3 mmHg. Aortic valve  peak gradient measures 42.9 mmHg. Aortic valve area, by VTI measures 1.15  cm.  1. Left ventricular ejection fraction, by estimation, is 55 to 60%. Left  ventricular ejection fraction by 3D volume is 57 %. The left ventricle has  normal function. The left ventricle has no regional wall motion  abnormalities. Left ventricular diastolic   parameters are consistent with Grade I diastolic dysfunction (impaired  relaxation). The average left ventricular global longitudinal strain is  -22.7 %. The global longitudinal strain is normal.   2. Right ventricular systolic function is normal. The right ventricular  size is normal. There is normal pulmonary artery systolic pressure. The  estimated right  ventricular systolic pressure is 30.7 mmHg.   3. Left atrial size was mildly dilated.   4. The mitral valve is normal in structure. Mild mitral valve  regurgitation.   5. The aortic valve is tricuspid. There is moderate calcification of the  aortic valve. There is moderate thickening of the aortic valve. Aortic  valve regurgitation is mild. Moderate aortic valve stenosis.   6. The inferior vena cava is normal in size with greater than 50%  respiratory variability, suggesting right atrial pressure of 3 mmHg.   Cardiac Cath 06/21/17  Prox RCA to Mid RCA lesion is 30% stenosed.  Mid RCA lesion is 40% stenosed.  Dist RCA lesion is 30% stenosed.  Ost LAD to Prox LAD lesion is 100% stenosed.  LIMA graft was visualized by angiography and is normal in caliber.  Ost Cx to Prox Cx lesion is 90% stenosed.  Hemodynamic findings consistent with mild pulmonary hypertension.  LV end diastolic pressure is normal.   1. Severe double vessel CAD s/p 3V CABG with 3/3 patent bypass grafts  2. The LAD has 100% proximal occlusion. The entire LAD fills from the patent LIMA graft. The Diagonal branch fills from the vein graft 3. The Circumflex has 90% proximal stenosis. The vein graft fills the large obtuse marginal graft.  4. The RCA is a large dominant vessel with mild diffuse calcific plaque in the proximal, mid and distal vessel but no obstructive lesions.  5. The LIMA to the LAD is patent 6 The sequential saphenous vein graft to the Diagonal and OM is patent with mild filling defects seen in the proximal, mid and distal segments of the body of the graft. These lesions do  not appear to be flow limiting.  7. Mild aortic stenosis. Peak to peak gradient 4 mmHg. The valve was easily crossed.  8. Right and left heart filling pressures outline above.       Neuro/Psych Ataxia negative neurological ROS  negative psych ROS   GI/Hepatic negative GI ROS, Neg liver ROS,,,  Endo/Other  diabetes, Poorly  Controlled, Type 2, Insulin Dependent  Hyperlipidemia FS 89 on the floor, given dextrose IV for symptomatic hypoglycemia  Renal/GU Dialysis and ESRFRenal diseaseMalfunctioning PD catheter   Hx/o Prostate Ca S/P seed placement    Musculoskeletal  (+) Arthritis , Osteoarthritis,    Abdominal   Peds  Hematology  (+) Blood dyscrasia, anemia   Anesthesia Other Findings   Reproductive/Obstetrics                              Anesthesia Physical Anesthesia Plan  ASA: 4  Anesthesia Plan: General   Post-op Pain Management: Ofirmev IV (intra-op)*   Induction: Intravenous  PONV Risk Score and Plan: 4 or greater and Treatment may vary due to age or medical condition and Ondansetron  Airway Management Planned: Oral ETT  Additional Equipment: None  Intra-op Plan:   Post-operative Plan: Extubation in OR  Informed Consent: I have reviewed the patients History and Physical, chart, labs and discussed the procedure including the risks, benefits and alternatives for the proposed anesthesia with the patient or authorized representative who has indicated his/her understanding and acceptance.   Patient has DNR.  Discussed DNR with patient, Continue DNR and Suspend DNR.   Dental advisory given  Plan Discussed with: CRNA  Anesthesia Plan Comments: (D/w wife DNR- would not like chest compressions, but okay with defibrillation, IV medications and temporary intubation.)         Anesthesia Quick Evaluation

## 2022-06-29 NOTE — Anesthesia Procedure Notes (Signed)
Procedure Name: Intubation Date/Time: 06/29/2022 10:01 AM  Performed by: Sheppard Evens, CRNAPre-anesthesia Checklist: Patient identified, Emergency Drugs available, Suction available and Patient being monitored Patient Re-evaluated:Patient Re-evaluated prior to induction Oxygen Delivery Method: Circle System Utilized Preoxygenation: Pre-oxygenation with 100% oxygen Induction Type: IV induction Ventilation: Mask ventilation without difficulty Laryngoscope Size: Mac and 3 Grade View: Grade II Tube type: Oral Tube size: 7.5 mm Number of attempts: 1 Airway Equipment and Method: Stylet and Oral airway Placement Confirmation: ETT inserted through vocal cords under direct vision, positive ETCO2 and breath sounds checked- equal and bilateral Secured at: 23 cm Tube secured with: Tape Dental Injury: Teeth and Oropharynx as per pre-operative assessment

## 2022-06-29 NOTE — Progress Notes (Addendum)
PROGRESS NOTE    [name]  VHQ:[id number] DOB: April 03, 1933 DOA: 06/22/2022 PCP: Linus Galas, NP   Brief Narrative:  89 year old African-American male history of end-stage renal disease on peritoneal dialysis for last 3 years, moderate aortic stenosis by echocardiogram, type 2 diabetes, coronary disease status post CABG, hypertension, mild cognitive impairment who presented the ER with issues regarding his peritoneal dialysis. it appears that the patient and his wife have not been able to manage his peritoneal dialysis at home.  Wife states over the last month and a half, the patient's had issues with his peritoneal dialysis.  Patient states that he will hang a bag of dialysate fluid on his home cycler.  But when he wakes up in the morning, the bag of dialysate fluid is full and the waste fluid bag is empty.  Wife has noticed the patient having lower extreme edema for the last week.   Patient was sent to the dialysis center, per the note from Dr. Signe Colt, and had his peritoneal cavity filled with dialysis fluid.  They had trouble draining the fluid from the cycler or by manual drainage.  Patient was sent to the ER by Dr. Signe Colt.  Assessment & Plan:   Principal Problem:   Peritoneal dialysis catheter dysfunction (HCC) Active Problems:   Moderate aortic stenosis by prior echocardiogram - mean gradient 24.3 mmHg. peak gradient 42.9 mmHg. Aortic valve area, by VTI measures 1.15  cm.   Essential hypertension   DM2 (diabetes mellitus, type 2) (HCC)   Hypokalemia   ESRD on peritoneal dialysis (HCC)   DNR (do not resuscitate)  ESRD on PD/peritoneal dialysis catheter dysfunction (HCC) CT abdomen shows heavy burden of stool/constipation.  Nephrology on board and opined that he is PD catheter tip appears to be in pelvis and his constipation could be the reason for occlusion.  Patient was started on bowel prep, he had several bowel movements but catheter did not work.  Eventually patient received  temporary dialysis catheter and has been started on intermittent hemodialysis for now.  Vascular surgery has been consulted and patient is scheduled to have his PD catheter repaired or exchanged for which he is scheduled with vascular surgery today.   Moderate aortic stenosis by prior echocardiogram - mean gradient 24.3 mmHg. peak gradient 42.9 mmHg. Aortic valve area, by VTI measures 1.15  cm. Chronic. May need to watch out for hypotension during dialysis.   Hypokalemia Resolved.  Per nephrology.   DM2 (diabetes mellitus, type 2) (HCC) Currently on Tradjenta and Semglee 8 units and SSI.  This morning, likely due to being n.p.o. from midnight.  Monitor closely.   Essential hypertension Only slightly elevated, hopefully will improve with intermittent hemodialysis.  Continue current home medications.  DVT prophylaxis: SCDs Start: 06/22/22 2100   Code Status: DNR  Family Communication:  None present at bedside.  Consultants are talking to the patient's wife regularly.  Status is: Inpatient Remains inpatient appropriate because: PD is still not working.  Patient is scheduled to have this fixed or exchanged today by vascular surgery.  Potential discharge tomorrow.    Estimated body mass index is 25.34 kg/m as calculated from the following:   Height as of this encounter: 5\' 11"  (1.803 m).   Weight as of this encounter: 82.4 kg.    Nutritional Assessment: Body mass index is 25.34 kg/m.Marland Kitchen Seen by dietician.  I agree with the assessment and plan as outlined below: Nutrition Status:        . Skin Assessment: I  have examined the patient's skin and I agree with the wound assessment as performed by the wound care RN as outlined below:    Consultants:  Nephrology  Procedures:  None  Antimicrobials:  Anti-infectives (From admission, onward)    Start     Dose/Rate Route Frequency Ordered Stop   06/26/22 0810  ceFAZolin (ANCEF) IVPB 2g/100 mL premix        over 30 Minutes  Intravenous Continuous PRN 06/26/22 0812 06/26/22 0830         Subjective: Seen and examined.  Little sleepy.  No complaints.  Objective: Vitals:   06/28/22 1649 06/28/22 1705 06/28/22 2140 06/29/22 0406  BP: (!) 149/72 (!) 149/73 (!) 159/67 (!) 149/69  Pulse: 74 77 77 78  Resp: (!) 21 18 18 18   Temp: 98.1 F (36.7 C) 98.1 F (36.7 C) 98 F (36.7 C) 98.1 F (36.7 C)  TempSrc:   Oral Oral  SpO2: 99% 99% 97% 99%  Weight: 82.4 kg     Height:        Intake/Output Summary (Last 24 hours) at 06/29/2022 0928 Last data filed at 06/29/2022 0600 Gross per 24 hour  Intake 747.37 ml  Output 4 ml  Net 743.37 ml    Filed Weights   06/24/22 0500 06/28/22 1325 06/28/22 1649  Weight: 84 kg 84.8 kg 82.4 kg    Examination:  General exam: Appears calm and comfortable  Respiratory system: Clear to auscultation. Respiratory effort normal. Cardiovascular system: S1 & S2 heard, RRR. No JVD, murmurs, rubs, gallops or clicks.  +2 pitting edema Gastrointestinal system: Abdomen is nondistended, soft and nontender. No organomegaly or masses felt. Normal bowel sounds heard. Central nervous system: Alert and oriented. No focal neurological deficits. Extremities: Symmetric 5 x 5 power. Skin: No rashes, lesions or ulcers.  Psychiatry: Judgement and insight appear poor   Data Reviewed: I have personally reviewed following labs and imaging studies  CBC: Recent Labs  Lab 06/22/22 1507 06/24/22 1006 06/25/22 1029 06/26/22 0317 06/26/22 1600 06/27/22 1101  WBC 8.2 11.5* 12.5* 9.3 8.8 12.7*  NEUTROABS 6.1  --   --  7.0  --   --   HGB 9.6* 9.0* 9.3* 8.2* 8.1* 10.2*  HCT 31.9* 28.4* 29.5* 26.9* 26.2* 33.9*  MCV 96.4 93.1 94.6 95.4 96.0 96.6  PLT 325 313 360 291 272 337    Basic Metabolic Panel: Recent Labs  Lab 06/23/22 1131 06/24/22 1006 06/25/22 1029 06/26/22 1600 06/27/22 1101  NA 143 142 146* 143 145  K 3.0* 3.1* 3.5 3.1* 3.5  CL 111 110 110 112* 109  CO2 23 23 24 25 23    GLUCOSE 216* 232* 105* 117* 111*  BUN 50* 50* 50* 42* 34*  CREATININE 3.85* 3.78* 4.12* 3.70* 3.36*  CALCIUM 9.5 9.3 9.4 8.3* 8.9  MG 2.0  --   --   --   --   PHOS  --  1.8* 1.6* 1.4* 1.4*    GFR: Estimated Creatinine Clearance: 16.8 mL/min (A) (by C-G formula based on SCr of 3.36 mg/dL (H)). Liver Function Tests: Recent Labs  Lab 06/22/22 1507 06/23/22 1131 06/24/22 1006 06/25/22 1029 06/26/22 1600 06/27/22 1101  AST 21 29  --   --   --   --   ALT 15 15  --   --   --   --   ALKPHOS 51 53  --   --   --   --   BILITOT 0.6 0.8  --   --   --   --  PROT 7.0 7.4  --   --   --   --   ALBUMIN 2.6* 2.7* 2.4* 2.5* 2.0* 2.5*    Recent Labs  Lab 06/22/22 1507  LIPASE 37    No results for input(s): "AMMONIA" in the last 168 hours. Coagulation Profile: No results for input(s): "INR", "PROTIME" in the last 168 hours. Cardiac Enzymes: No results for input(s): "CKTOTAL", "CKMB", "CKMBINDEX", "TROPONINI" in the last 168 hours. BNP (last 3 results) No results for input(s): "PROBNP" in the last 8760 hours. HbA1C: No results for input(s): "HGBA1C" in the last 72 hours.  CBG: Recent Labs  Lab 06/28/22 1700 06/28/22 2142 06/29/22 0717 06/29/22 0824 06/29/22 0837  GLUCAP 93 105* 52* 63* 89    Lipid Profile: No results for input(s): "CHOL", "HDL", "LDLCALC", "TRIG", "CHOLHDL", "LDLDIRECT" in the last 72 hours. Thyroid Function Tests: No results for input(s): "TSH", "T4TOTAL", "FREET4", "T3FREE", "THYROIDAB" in the last 72 hours. Anemia Panel: No results for input(s): "VITAMINB12", "FOLATE", "FERRITIN", "TIBC", "IRON", "RETICCTPCT" in the last 72 hours.  Sepsis Labs: No results for input(s): "PROCALCITON", "LATICACIDVEN" in the last 168 hours.  Recent Results (from the past 240 hour(s))  Surgical pcr screen     Status: Abnormal   Collection Time: 06/29/22 12:12 AM   Specimen: Nasal Mucosa; Nasal Swab  Result Value Ref Range Status   MRSA, PCR NEGATIVE NEGATIVE Final    Staphylococcus aureus POSITIVE (A) NEGATIVE Final    Comment: (NOTE) The Xpert SA Assay (FDA approved for NASAL specimens in patients 49 years of age and older), is one component of a comprehensive surveillance program. It is not intended to diagnose infection nor to guide or monitor treatment. Performed at [facility], [address] [place], [place] [address]      Radiology Studies: No results found.  Scheduled Meds:  [MAR Hold] atorvastatin  80 mg Oral Daily   [MAR Hold] carvedilol  3.125 mg Oral BID WC   chlorhexidine       [MAR Hold] Chlorhexidine Gluconate Cloth  6 each Topical Daily   [MAR Hold] Chlorhexidine Gluconate Cloth  6 each Topical Q0600   [MAR Hold] Chlorhexidine Gluconate Cloth  6 each Topical Q0600   [MAR Hold] gentamicin cream  1 Application Topical Daily   [MAR Hold] hydrALAZINE  50 mg Oral Q12H   [MAR Hold] insulin aspart  0-5 Units Subcutaneous QHS   [MAR Hold] insulin aspart  0-6 Units Subcutaneous TID WC   [MAR Hold] insulin glargine-yfgn  8 Units Subcutaneous BID   [MAR Hold] isosorbide mononitrate  30 mg Oral Daily   [MAR Hold] lactulose  30 g Oral Daily   [MAR Hold] lidocaine-EPINEPHrine  20 mL Infiltration Once   [MAR Hold] linagliptin  5 mg Oral Daily   [MAR Hold] mupirocin ointment  1 Application Nasal BID   [MAR Hold] pantoprazole  40 mg Oral Daily   [MAR Hold] polyethylene glycol  17 g Oral BID   Continuous Infusions:  [MAR Hold] dialysis solution 2.5% low-MG/low-CA Stopped (06/25/22 1803)   [MAR Hold] ferric gluconate (FERRLECIT) IVPB 250 mg (06/28/22 0912)     LOS: 6 days   Hughie Closs, MD [facility]  06/29/2022, 9:28 AM   *Please note that this is a verbal dictation therefore any spelling or grammatical errors are due to the "[employer]agon [employer] One" system interpretation.  Please page via Amion and do not message via secure chat for urgent patient care matters. Secure chat can be used for non urgent  patient care  matters.  How to contact the [facility] Attending or Consulting provider 7A - 7P or covering provider during after hours 7P -7A, for this patient?  Check the care team in [facility] and look for a) attending/consulting TRH provider listed and b) the [facility] team listed. Page or secure chat 7A-7P. Log into [email or web address] and use [place]'s universal password to access. If you do not have the password, please contact the hospital operator. Locate the [facility] provider you are looking for under [facility] and page to a number that you can be directly reached. If you still have difficulty reaching the provider, please page the [facility] (Director on Call) for the Hospitalists listed on amion for assistance.

## 2022-06-29 NOTE — Anesthesia Postprocedure Evaluation (Signed)
Anesthesia Post Note  Patient: [name]  Procedure(s) Performed: REPLACEMENT OF PERITONEAL DIALYSIS CATHETER (Abdomen) LAPAROSCOPY DIAGNOSTIC (Abdomen) LAPAROSCOPIC LYSIS OF ADHESIONS (Abdomen)     Patient location during evaluation: PACU Anesthesia Type: General Level of consciousness: awake and alert, oriented and patient cooperative Pain management: pain level controlled Vital Signs Assessment: post-procedure vital signs reviewed and stable Respiratory status: spontaneous breathing, nonlabored ventilation and respiratory function stable Cardiovascular status: blood pressure returned to baseline and stable Postop Assessment: no apparent nausea or vomiting Anesthetic complications: no   No notable events documented.  Last Vitals:  Vitals:   06/29/22 1145 06/29/22 1211  BP: (!) 144/59 (!) 145/64  Pulse: 60 64  Resp: 16 18  Temp: 36.5 C 36.8 C  SpO2: 96% 95%    Last Pain:  Vitals:   06/29/22 1145  TempSrc:   PainSc: Asleep   Pain Goal: Patients Stated Pain Goal: 3 (06/29/22 0932)                 Lannie Fields

## 2022-06-30 ENCOUNTER — Encounter (HOSPITAL_COMMUNITY): Payer: Self-pay | Admitting: Vascular Surgery

## 2022-06-30 DIAGNOSIS — T85611A Breakdown (mechanical) of intraperitoneal dialysis catheter, initial encounter: Secondary | ICD-10-CM | POA: Diagnosis not present

## 2022-06-30 LAB — RENAL FUNCTION PANEL
Albumin: 2 g/dL — ABNORMAL LOW (ref 3.5–5.0)
Albumin: 2 g/dL — ABNORMAL LOW (ref 3.5–5.0)
Anion gap: 8 (ref 5–15)
Anion gap: 9 (ref 5–15)
BUN: 21 mg/dL (ref 8–23)
BUN: 9 mg/dL (ref 8–23)
CO2: 26 mmol/L (ref 22–32)
CO2: 28 mmol/L (ref 22–32)
Calcium: 7.9 mg/dL — ABNORMAL LOW (ref 8.9–10.3)
Calcium: 7.4 mg/dL — ABNORMAL LOW (ref 8.9–10.3)
Chloride: 107 mmol/L (ref 98–111)
Chloride: 99 mmol/L (ref 98–111)
Creatinine, Ser: 3.49 mg/dL — ABNORMAL HIGH (ref 0.61–1.24)
Creatinine, Ser: 1.97 mg/dL — ABNORMAL HIGH (ref 0.61–1.24)
GFR, Estimated: 16 mL/min — ABNORMAL LOW (ref >60)
GFR, Estimated: 32 mL/min — ABNORMAL LOW (ref >60)
Glucose, Bld: 94 mg/dL (ref 70–99)
Glucose, Bld: 100 mg/dL — ABNORMAL HIGH (ref 70–99)
Phosphorus: 2.7 mg/dL (ref 2.5–4.6)
Phosphorus: 1.8 mg/dL — ABNORMAL LOW (ref 2.5–4.6)
Potassium: 3.1 mmol/L — ABNORMAL LOW (ref 3.5–5.1)
Potassium: 3.3 mmol/L — ABNORMAL LOW (ref 3.5–5.1)
Sodium: 141 mmol/L (ref 135–145)
Sodium: 136 mmol/L (ref 135–145)

## 2022-06-30 LAB — GLUCOSE, CAPILLARY
Glucose-Capillary: 71 mg/dL (ref 70–99)
Glucose-Capillary: 73 mg/dL (ref 70–99)
Glucose-Capillary: 88 mg/dL (ref 70–99)
Glucose-Capillary: 113 mg/dL — ABNORMAL HIGH (ref 70–99)
Glucose-Capillary: 161 mg/dL — ABNORMAL HIGH (ref 70–99)

## 2022-06-30 LAB — CBC
HCT: 27.7 % — ABNORMAL LOW (ref 39.0–52.0)
Hemoglobin: 8.3 g/dL — ABNORMAL LOW (ref 13.0–17.0)
MCH: 29.1 pg (ref 26.0–34.0)
MCHC: 30 g/dL (ref 30.0–36.0)
MCV: 97.2 fL (ref 80.0–100.0)
Platelets: 254 10*3/uL (ref 150–400)
RBC: 2.85 MIL/uL — ABNORMAL LOW (ref 4.22–5.81)
RDW: 18 % — ABNORMAL HIGH (ref 11.5–15.5)
WBC: 11.2 10*3/uL — ABNORMAL HIGH (ref 4.0–10.5)
nRBC: 0.2 % (ref 0.0–0.2)

## 2022-06-30 MED ORDER — HEPARIN SODIUM (PORCINE) 1000 UNIT/ML IJ SOLN
INTRAMUSCULAR | Status: AC
  Filled 2022-06-30: qty 4

## 2022-06-30 MED ORDER — ANTICOAGULANT SODIUM CITRATE 4% (200MG/5ML) IV SOLN: 5.0000 mL | Status: DC | PRN

## 2022-06-30 MED ORDER — POTASSIUM CHLORIDE 20 MEQ PO PACK
40.0000 meq | PACK | Freq: Once | ORAL | Status: AC
  Administered 2022-06-30: 40 meq via ORAL
  Filled 2022-06-30: qty 2

## 2022-06-30 MED ORDER — GENTAMICIN SULFATE 0.1 % EX CREA
1.0000 | TOPICAL_CREAM | Freq: Every day | CUTANEOUS | Status: DC
  Administered 2022-06-30 – 2022-07-01 (×3): 1 via TOPICAL
  Filled 2022-06-30: qty 15

## 2022-06-30 MED ORDER — POTASSIUM CHLORIDE 10 MEQ/50ML IV SOLN
10.0000 meq | INTRAVENOUS | Status: AC
  Administered 2022-06-30 (×2): 10 meq via INTRAVENOUS
  Filled 2022-06-30 (×2): qty 50

## 2022-06-30 MED ORDER — INSULIN GLARGINE-YFGN 100 UNIT/ML ~~LOC~~ SOLN
10.0000 [IU] | Freq: Every day | SUBCUTANEOUS | Status: DC
  Administered 2022-07-01 – 2022-07-04 (×4): 10 [IU] via SUBCUTANEOUS
  Filled 2022-06-30 (×7): qty 0.1

## 2022-06-30 MED ORDER — ALTEPLASE 2 MG IJ SOLR: 2.0000 mg | Freq: Once | INTRAMUSCULAR | Status: DC | PRN

## 2022-06-30 MED ORDER — POTASSIUM CHLORIDE 10 MEQ/50ML IV SOLN: 10.0000 meq | INTRAVENOUS | Status: DC

## 2022-06-30 MED ORDER — DELFLEX-LC/1.5% DEXTROSE 344 MOSM/L IP SOLN
INTRAPERITONEAL | Status: DC
  Administered 2022-07-01: 6000 mL via INTRAPERITONEAL

## 2022-06-30 NOTE — TOC Progression Note (Signed)
Transition of Care [facility]) - Progression Note    Patient Details  Name: [name] MRN: [id number] Date of Birth: 29-Apr-1933  Transition of Care [facility]) CM/SW Contact  Tom-Johnson, Hershal Coria, RN Phone Number: 06/30/2022, 2:23 PM  Clinical Narrative:     Patient underwent Laparoscopic Lysis of Adhesions and Revision of Peritoneal Dialysis Catheter yesterday 06/29/22 by Vascular. Currently on temporary HD MWF schedule inpatient. Per Nephrology, unclear if patient will return to PD or need to be clipped to HD.  CM continues to follow as patient progresses with care towards discharge.          Expected Discharge Plan: Home/Self Care Barriers to Discharge: Continued Medical Work up  Expected Discharge Plan and Services   Discharge Planning Services: CM Consult Post Acute Care Choice: NA Living arrangements for the past 2 months: Single Family Home                 DME Arranged: N/A DME Agency: NA       HH Arranged: NA HH Agency: NA         Social Determinants of Health (SDOH) Interventions SDOH Screenings   Food Insecurity: No Food Insecurity (06/22/2022)  Housing: Low Risk  (06/22/2022)  Transportation Needs: No Transportation Needs (06/22/2022)  Utilities: Not At Risk (06/22/2022)  Depression (PHQ2-9): Low Risk  (07/13/2020)  Physical Activity: Sufficiently Active (10/31/2019)  Tobacco Use: Medium Risk (06/30/2022)    Readmission Risk Interventions    06/27/2022    3:51 PM  Readmission Risk Prevention Plan  Transportation Screening Complete  PCP or Specialist Appt within 5-7 Days Complete  Home Care Screening Complete  Medication Review (RN CM) Referral to Pharmacy

## 2022-06-30 NOTE — Progress Notes (Addendum)
Contacted by nephrologist with request that out-pt HD clinic placement process be started. Spoke to pt's wife via phone to discuss clinic placement and clinic preference. Pt's wife prefers [facility] or [facility]. Referral submitted to [facility] admissions to request transfer to in-center for at least one month. Wife plans to transport pt to/from HD. Will assist as needed.   [name] Renal Navigator [phone]  Addendum at 3:39 pm: It is unlikely that out-pt HD clinic approval will be received today. Update provided to nephrologist and renal NP.

## 2022-06-30 NOTE — Progress Notes (Signed)
PIV consult: pt in HD, per Vikki Ports, RN potassium will be administered during treatment. RN to re-enter consult when back in room if needed.

## 2022-06-30 NOTE — Progress Notes (Addendum)
[facility] Progress Note   Subjective: S/P revision of PD 06/29/2022 per Dr. Lenell Antu. On HD very pleasant, oriented to self and place. Very low K+ with frequent PVCs on monitor. Replete K+ change to 4.0 K bat  Objective Vitals:   06/30/22 0800 06/30/22 0803 06/30/22 0830 06/30/22 0900  BP: (!) 111/53 (!) 128/56 114/63 (!) 119/59  Pulse:  73 69 74  Resp: (!) 25 (!) 26 13 (!) 22  Temp:      TempSrc:      SpO2:  96% 96% 99%  Weight:      Height:       Physical Exam General: Pleasant, very elderly male in NAD Heart: S1,S2 3/6 systolic M. SR PACs on monitor.  Lungs: Coarse upper airway lungs sounds otherwise CTAB.  Abdomen: Soft, Drsg intact RUQ Extremities: No LE edema Dialysis Access: [facility] drsg intact   Additional Objective Labs: Basic Metabolic Panel: Recent Labs  Lab 06/27/22 1101 06/29/22 0938 06/29/22 1623 06/30/22 0730  NA 145 140 140 141  K 3.5 4.1 3.3* 3.1*  CL 109 102 106 107  CO2 23  --  25 26  GLUCOSE 111* 131* 86 94  BUN 34* 25* 18 21  CREATININE 3.36* 3.00* 2.98* 3.49*  CALCIUM 8.9  --  7.7* 7.9*  PHOS 1.4*  --  2.5 2.7   Liver Function Tests: Recent Labs  Lab 06/23/22 1131 06/24/22 1006 06/27/22 1101 06/29/22 1623 06/30/22 0730  AST 29  --   --   --   --   ALT 15  --   --   --   --   ALKPHOS 53  --   --   --   --   BILITOT 0.8  --   --   --   --   PROT 7.4  --   --   --   --   ALBUMIN 2.7*   < > 2.5* 2.1* 2.0*   < > = values in this interval not displayed.   No results for input(s): "LIPASE", "AMYLASE" in the last 168 hours. CBC: Recent Labs  Lab 06/26/22 0317 06/26/22 1600 06/27/22 1101 06/29/22 0938 06/29/22 1623 06/30/22 0826  WBC 9.3 8.8 12.7*  --  9.2 11.2*  NEUTROABS 7.0  --   --   --   --   --   HGB 8.2* 8.1* 10.2* 10.2* 9.4* 8.3*  HCT 26.9* 26.2* 33.9* 30.0* 31.2* 27.7*  MCV 95.4 96.0 96.6  --  96.6 97.2  PLT 291 272 337  --  290 254   Blood Culture    Component Value Date/Time   SDES CATH TIP  06/29/2022 0902   SPECREQUEST NONE 06/29/2022 0902   CULT  06/29/2022 0902    NO GROWTH < 24 HOURS Performed at [facility], [address], [place], [place] [address]    REPTSTATUS PENDING 06/29/2022 8469    Cardiac Enzymes: No results for input(s): "CKTOTAL", "CKMB", "CKMBINDEX", "TROPONINI" in the last 168 hours. CBG: Recent Labs  Lab 06/29/22 1213 06/29/22 1630 06/29/22 2121 06/30/22 0656 06/30/22 0725  GLUCAP 102* 87 80 71 73   Iron Studies: No results for input(s): "IRON", "TIBC", "TRANSFERRIN", "FERRITIN" in the last 72 hours. @lablastinr3 @ Studies/Results: No results found. Medications:  anticoagulant sodium citrate     dialysis solution 2.5% low-MG/low-CA Stopped (06/25/22 1803)   ferric gluconate (FERRLECIT) IVPB 250 mg (06/28/22 0912)    atorvastatin  80 mg Oral Daily   carvedilol  3.125  mg Oral BID WC   Chlorhexidine Gluconate Cloth  6 each Topical Q0600   gentamicin cream  1 Application Topical Daily   hydrALAZINE  50 mg Oral Q12H   insulin aspart  0-5 Units Subcutaneous QHS   insulin aspart  0-6 Units Subcutaneous TID WC   insulin glargine-yfgn  8 Units Subcutaneous BID   isosorbide mononitrate  30 mg Oral Daily   lactulose  30 g Oral Daily   lidocaine-EPINEPHrine  20 mL Infiltration Once   linagliptin  5 mg Oral Daily   mupirocin ointment  1 Application Nasal BID   pantoprazole  40 mg Oral Daily   polyethylene glycol  17 g Oral BID     Outpatient Dialysis Orders: [facility] PD CCPD 5 exchanges 2.5L dwell 1 hr 30 min dwell time No pause, no day dwell     Assessment/Plan: 1 Underdialysis/ PD catheter malfunction/ cognitive decline:  -Pt and wife's primary goal is to remain at home.  CT showed large stool burden and PD catheter in the anterior right lower quadrant adjacent to small bowel loops. Completed bowel regimen but multiple attempts to drain unsuccessful. Still having intermittent constipation but had multiple BMs yesterday and reports stomach  feels better.  -TDC placed by IR for hopefully temporary HD while determining cause for PD catheter dysfunction. Had 2 hour HD Monday and tolerated well. HD today. Very low albumin 2.0, not sure he is candidate to continue PD whether PD catheter functions or not. Revision of PD catheter 06/29/2022. Per VVS note, if PD catheter does not function post revision 06/29/2022, they would not recommend further efforts at PD .     2 ESRD: on PD, see above in #1.  On HD temporarily. MWF schedule for now. I have placed HD orders for tomorrow, will be beneficial to try to optimize volume status with HD. Ability to resume PD will depend on outcome of surgery.  We will flush with low volume tonight.    3. Hypokalemia - K+ 3.1 this AM. Having PVCs on monitor. Using high K+ bath with HD. Give KCL 10 MEQ KCL IV X 2.    4 Hypertension/ volume: overloaded, Ordered lasix to help improve volume. Resolving LE edema, no respiratory distress. BP improved this AM, on carvedilol 3.125mg  BID and hydralazine 50mg  BID. UF with HD as tolerated.    4. Anemia of ESRD: Last Hgb 10.2. tsat 9%, receiving IV iron course.   5. Metabolic Bone Disease: Corrected Ca high, hold calcitriol for now.  Phos low. Was on Auryxia which has been DC'd. Give K+ phos- now 2.7. Does not really need a renal diet, changed to heart healthy/carb modified. Continued fluid restrictions.   6.  Dispo: Unclear at this time whether he will return to PD or need to be clipped to HD. Contacting Dr. Signe Colt.   Larisha Vencill H. Alajah Witman NP-C 06/30/2022, 9:17 AM  [employer] [phone]

## 2022-06-30 NOTE — Plan of Care (Signed)
  Problem: Fluid Volume: Goal: Ability to maintain a balanced intake and output will improve Outcome: Not Progressing   Problem: Metabolic: Goal: Ability to maintain appropriate glucose levels will improve Outcome: Not Progressing   Problem: Nutritional: Goal: Maintenance of adequate nutrition will improve Outcome: Not Progressing   Problem: Nutrition: Goal: Adequate nutrition will be maintained Outcome: Not Progressing   Problem: Skin Integrity: Goal: Risk for impaired skin integrity will decrease Outcome: Not Progressing

## 2022-06-30 NOTE — Procedures (Signed)
HD Note:  Some information was entered later than the data was gathered due to patient care needs. The stated time with the data is accurate.  Received patient in bed to unit.  Alert and oriented.  Informed consent signed and in chart.   TX duration: 4 hours, ordered time was extended due to medications ordered during treatment  Patient's labs drawn prior to treatment, K+ resulted at 3.1, new orders received from Alonna Buckler, [place] at [phone] and carried out. See MAR and flowsheet.  PD flushes  were also ordered completed as ordered.Patient received 500 ml flushes x3 for a total of 1500 ml.  2400 ml was drained.  Liquid was a dark amber color. Patient stated that he did not have any pain during the process.   Transported back to the room  Alert, without acute distress.  Hand-off given to patient's nurse.   Access used: Right upper chest HD catheter Access issues: None  Total UF removed: 2500 ml   [facility]

## 2022-06-30 NOTE — Progress Notes (Signed)
PROGRESS NOTE    [name]  ZOX:[id number] DOB: [birth year] DOA: 06/22/2022 PCP: Linus Galas, NP   Brief Narrative:  89 year old African-American male history of end-stage renal disease on peritoneal dialysis for last 3 years, moderate aortic stenosis by echocardiogram, type 2 diabetes, coronary disease status post CABG, hypertension, mild cognitive impairment who presented the ER with issues regarding his peritoneal dialysis/PD catheter not working.  Nephrology initially thought that due to the position of PD catheter, him having constipation was likely the source.  He was given bowel prep, had multiple bowel movements but still PD catheter did not work.  IR consulted, temporary dialysis catheter was placed and patient was started on hemodialysis.  Vascular surgery consulted, underwent diagnostic laparoscopy and revision of the peritoneal dialysis catheter on 06/29/2022.  Per nephrology, plan is to continue intermittent hemodialysis for a month, improve his nutrition and then transition back to peritoneal dialysis in a month.  ClIP process started.  Assessment & Plan:   Principal Problem:   Peritoneal dialysis catheter dysfunction (HCC) Active Problems:   Moderate aortic stenosis by prior echocardiogram - mean gradient 24.3 mmHg. peak gradient 42.9 mmHg. Aortic valve area, by VTI measures 1.15  cm.   Essential hypertension   DM2 (diabetes mellitus, type 2) (HCC)   Hypokalemia   ESRD on peritoneal dialysis (HCC)   DNR (do not resuscitate)  ESRD on PD/peritoneal dialysis catheter dysfunction (HCC) CT abdomen shows heavy burden of stool/constipation.  Nephrology consult, their initial opinion was that the position of the PD as well as his constipation was likely the reason for his PD not working, he was given bowel prep, had multiple bowel movements but PD catheter still did not work.  IR was consulted, patient underwent tunneled dialysis catheter on 06/26/2022 and started on intermittent  hemodialysis.  Vascular surgery consulted, patient underwent revision of the PD on 06/29/2022.  Per my discussion with Dr. Chales Abrahams of nephrology, they are planning to continue intermittent dialysis for a month and try to improve his nutrition and then transition to PD after that.  Clip process has been started.   Moderate aortic stenosis by prior echocardiogram - mean gradient 24.3 mmHg. peak gradient 42.9 mmHg. Aortic valve area, by VTI measures 1.15  cm. Chronic. May need to watch out for hypotension during dialysis.   Hypokalemia Low again.  Per nephrology.   DM2 (diabetes mellitus, type 2) (HCC) Currently on Tradjenta and Semglee 8 units twice daily and SSI.  Blood sugar on the low side, will reduce Semglee to only 10 units in the morning but continue SSI.    Essential hypertension Stable continue current home medications.  DVT prophylaxis: SCDs Start: 06/22/22 2100   Code Status: DNR  Family Communication:  None present at bedside.  Nephrology talking to the wife.  Status is: Inpatient Remains inpatient appropriate because: Nephrology plans to continue intermittent hemodialysis, clip in process.    Estimated body mass index is 24.75 kg/m as calculated from the following:   Height as of this encounter: 5\' 11"  (1.803 m).   Weight as of this encounter: 80.5 kg.    Nutritional Assessment: Body mass index is 24.75 kg/m.Marland Kitchen Seen by dietician.  I agree with the assessment and plan as outlined below: Nutrition Status:        . Skin Assessment: I have examined the patient's skin and I agree with the wound assessment as performed by the wound care RN as outlined below:    Consultants:  Nephrology  Procedures:  None  Antimicrobials:  Anti-infectives (From admission, onward)    Start     Dose/Rate Route Frequency Ordered Stop   06/26/22 0810  ceFAZolin (ANCEF) IVPB 2g/100 mL premix        over 30 Minutes Intravenous Continuous PRN 06/26/22 0812 06/26/22 0830          Subjective: Seen and examined in dialysis unit.  He has no complaints.  Objective: Vitals:   06/30/22 0830 06/30/22 0900 06/30/22 0930 06/30/22 1000  BP: 114/63 (!) 119/59 (!) 117/53 (!) 121/57  Pulse: 69 74 64 63  Resp: 13 (!) 22 (!) 23 (!) 26  Temp:      TempSrc:      SpO2: 96% 99% 91% 99%  Weight:      Height:        Intake/Output Summary (Last 24 hours) at 06/30/2022 1028 Last data filed at 06/30/2022 0800 Gross per 24 hour  Intake 400 ml  Output 145 ml  Net 255 ml    Filed Weights   06/28/22 1325 06/28/22 1649 06/30/22 0500  Weight: 84.8 kg 82.4 kg 80.5 kg    Examination:  General exam: Appears calm and comfortable  Respiratory system: Clear to auscultation. Respiratory effort normal. Cardiovascular system: S1 & S2 heard, RRR. No JVD, murmurs, rubs, gallops or clicks. No pedal edema. Gastrointestinal system: Abdomen is nondistended, soft and nontender. No organomegaly or masses felt. Normal bowel sounds heard. Central nervous system: Alert and oriented. No focal neurological deficits. Extremities: Symmetric 5 x 5 power. Skin: No rashes, lesions or ulcers.   Data Reviewed: I have personally reviewed following labs and imaging studies  CBC: Recent Labs  Lab 06/26/22 0317 06/26/22 1600 06/27/22 1101 06/29/22 0938 06/29/22 1623 06/30/22 0826  WBC 9.3 8.8 12.7*  --  9.2 11.2*  NEUTROABS 7.0  --   --   --   --   --   HGB 8.2* 8.1* 10.2* 10.2* 9.4* 8.3*  HCT 26.9* 26.2* 33.9* 30.0* 31.2* 27.7*  MCV 95.4 96.0 96.6  --  96.6 97.2  PLT 291 272 337  --  290 254    Basic Metabolic Panel: Recent Labs  Lab 06/23/22 1131 06/24/22 1006 06/25/22 1029 06/26/22 1600 06/27/22 1101 06/29/22 0938 06/29/22 1623 06/30/22 0730  NA 143   < > 146* 143 145 140 140 141  K 3.0*   < > 3.5 3.1* 3.5 4.1 3.3* 3.1*  CL 111   < > 110 112* 109 102 106 107  CO2 23   < > 24 25 23   --  25 26  GLUCOSE 216*   < > 105* 117* 111* 131* 86 94  BUN 50*   < > 50* 42* 34* 25* 18 21   CREATININE 3.85*   < > 4.12* 3.70* 3.36* 3.00* 2.98* 3.49*  CALCIUM 9.5   < > 9.4 8.3* 8.9  --  7.7* 7.9*  MG 2.0  --   --   --   --   --   --   --   PHOS  --    < > 1.6* 1.4* 1.4*  --  2.5 2.7   < > = values in this interval not displayed.    GFR: Estimated Creatinine Clearance: 16.2 mL/min (A) (by C-G formula based on SCr of 3.49 mg/dL (H)). Liver Function Tests: Recent Labs  Lab 06/23/22 1131 06/24/22 1006 06/25/22 1029 06/26/22 1600 06/27/22 1101 06/29/22 1623 06/30/22 0730  AST 29  --   --   --   --   --   --  ALT 15  --   --   --   --   --   --   ALKPHOS 53  --   --   --   --   --   --   BILITOT 0.8  --   --   --   --   --   --   PROT 7.4  --   --   --   --   --   --   ALBUMIN 2.7*   < > 2.5* 2.0* 2.5* 2.1* 2.0*   < > = values in this interval not displayed.    No results for input(s): "LIPASE", "AMYLASE" in the last 168 hours.  No results for input(s): "AMMONIA" in the last 168 hours. Coagulation Profile: No results for input(s): "INR", "PROTIME" in the last 168 hours. Cardiac Enzymes: No results for input(s): "CKTOTAL", "CKMB", "CKMBINDEX", "TROPONINI" in the last 168 hours. BNP (last 3 results) No results for input(s): "PROBNP" in the last 8760 hours. HbA1C: No results for input(s): "HGBA1C" in the last 72 hours.  CBG: Recent Labs  Lab 06/29/22 1213 06/29/22 1630 06/29/22 2121 06/30/22 0656 06/30/22 0725  GLUCAP 102* 87 80 71 73    Lipid Profile: No results for input(s): "CHOL", "HDL", "LDLCALC", "TRIG", "CHOLHDL", "LDLDIRECT" in the last 72 hours. Thyroid Function Tests: No results for input(s): "TSH", "T4TOTAL", "FREET4", "T3FREE", "THYROIDAB" in the last 72 hours. Anemia Panel: No results for input(s): "VITAMINB12", "FOLATE", "FERRITIN", "TIBC", "IRON", "RETICCTPCT" in the last 72 hours.  Sepsis Labs: No results for input(s): "PROCALCITON", "LATICACIDVEN" in the last 168 hours.  Recent Results (from the past 240 hour(s))  Surgical pcr  screen     Status: Abnormal   Collection Time: 06/29/22 12:12 AM   Specimen: Nasal Mucosa; Nasal Swab  Result Value Ref Range Status   MRSA, PCR NEGATIVE NEGATIVE Final   Staphylococcus aureus POSITIVE (A) NEGATIVE Final    Comment: (NOTE) The Xpert SA Assay (FDA approved for NASAL specimens in patients 76 years of age and older), is one component of a comprehensive surveillance program. It is not intended to diagnose infection nor to guide or monitor treatment. Performed at [facility], [address], [place], [place] [address]   Cath Tip Culture     Status: None (Preliminary result)   Collection Time: 06/29/22  9:02 AM   Specimen: Catheter Tip  Result Value Ref Range Status   Specimen Description CATH TIP  Final   Special Requests NONE  Final   Culture   Final    NO GROWTH < 24 HOURS Performed at [facility], [address] [place], [place] [address]    Report Status PENDING  Incomplete     Radiology Studies: No results found.  Scheduled Meds:  atorvastatin  80 mg Oral Daily   carvedilol  3.125 mg Oral BID WC   Chlorhexidine Gluconate Cloth  6 each Topical Q0600   gentamicin cream  1 Application Topical Daily   hydrALAZINE  50 mg Oral Q12H   insulin aspart  0-5 Units Subcutaneous QHS   insulin aspart  0-6 Units Subcutaneous TID WC   insulin glargine-yfgn  8 Units Subcutaneous BID   isosorbide mononitrate  30 mg Oral Daily   lactulose  30 g Oral Daily   lidocaine-EPINEPHrine  20 mL Infiltration Once   linagliptin  5 mg Oral Daily   mupirocin ointment  1 Application Nasal BID   pantoprazole  40 mg Oral Daily  polyethylene glycol  17 g Oral BID   Continuous Infusions:  anticoagulant sodium citrate     dialysis solution 2.5% low-MG/low-CA Stopped (06/25/22 1803)   ferric gluconate (FERRLECIT) IVPB 250 mg (06/28/22 0912)   potassium chloride 10 mEq (06/30/22 1012)     LOS: 7 days   Hughie Closs, MD [facility]  06/30/2022, 10:28 AM    *Please note that this is a verbal dictation therefore any spelling or grammatical errors are due to the "Dragon Medical One" system interpretation.  Please page via Amion and do not message via secure chat for urgent patient care matters. Secure chat can be used for non urgent patient care matters.  How to contact the [facility] Attending or Consulting provider 7A - 7P or covering provider during after hours 7P -7A, for this patient?  Check the care team in [facility] and look for a) attending/consulting TRH provider listed and b) the [facility] team listed. Page or secure chat 7A-7P. Log into [email or web address] and use [place]'s universal password to access. If you do not have the password, please contact the hospital operator. Locate the [facility] provider you are looking for under [facility] and page to a number that you can be directly reached. If you still have difficulty reaching the provider, please page the [facility] (Director on Call) for the Hospitalists listed on amion for assistance.

## 2022-06-30 NOTE — Progress Notes (Addendum)
      Subjective  - No complaints   Objective 121/61 79 98.2 F (36.8 C) (Oral) 18 96%  Intake/Output Summary (Last 24 hours) at 06/30/2022 0700 Last data filed at 06/29/2022 2155 Gross per 24 hour  Intake 400 ml  Output 145 ml  Net 255 ml    Abdomin soft, incision healing well without signs of infection Abdomin NTTP General no acute distress   Assessment/Planning: POD # 1 revision of PD cath  Catheter may be flushed  today  Follow up with VVS as needed if catheter not working. Dr. Lenell Antu states he would not recommend further efforts at peritoneal dialysis should this revision not be successful.   Mosetta Pigeon 06/30/2022 7:00 AM --  Laboratory Lab Results: Recent Labs    06/27/22 1101 06/29/22 0938 06/29/22 1623  WBC 12.7*  --  9.2  HGB 10.2* 10.2* 9.4*  HCT 33.9* 30.0* 31.2*  PLT 337  --  290   BMET Recent Labs    06/27/22 1101 06/29/22 0938 06/29/22 1623  NA 145 140 140  K 3.5 4.1 3.3*  CL 109 102 106  CO2 23  --  25  GLUCOSE 111* 131* 86  BUN 34* 25* 18  CREATININE 3.36* 3.00* 2.98*  CALCIUM 8.9  --  7.7*    COAG Lab Results  Component Value Date   INR 1.1 04/19/2019   INR 0.97 06/21/2017   INR 1.0 06/19/2017   No results found for: "PTT"   I have independently interviewed and examined the patient while on dialysis and agree with PA assessment and plan above.  Catheter initially was having difficulty flushing but now appears to be instilling fluid easily.  If PD catheter is not a possibility would likely require removal and transition to hemodialysis.  Santia Labate C. Randie Heinz, MD [facility] Office: [phone] Pager: [phone]

## 2022-07-01 DIAGNOSIS — T85611A Breakdown (mechanical) of intraperitoneal dialysis catheter, initial encounter: Secondary | ICD-10-CM | POA: Diagnosis not present

## 2022-07-01 LAB — CBC
HCT: 32.1 % — ABNORMAL LOW (ref 39.0–52.0)
Hemoglobin: 9.5 g/dL — ABNORMAL LOW (ref 13.0–17.0)
MCH: 28.7 pg (ref 26.0–34.0)
MCHC: 29.6 g/dL — ABNORMAL LOW (ref 30.0–36.0)
MCV: 97 fL (ref 80.0–100.0)
Platelets: 241 10*3/uL (ref 150–400)
RBC: 3.31 MIL/uL — ABNORMAL LOW (ref 4.22–5.81)
RDW: 18.2 % — ABNORMAL HIGH (ref 11.5–15.5)
WBC: 11 10*3/uL — ABNORMAL HIGH (ref 4.0–10.5)
nRBC: 0.3 % — ABNORMAL HIGH (ref 0.0–0.2)

## 2022-07-01 LAB — RENAL FUNCTION PANEL
Albumin: 2.1 g/dL — ABNORMAL LOW (ref 3.5–5.0)
Anion gap: 10 (ref 5–15)
BUN: 15 mg/dL (ref 8–23)
CO2: 26 mmol/L (ref 22–32)
Calcium: 7.9 mg/dL — ABNORMAL LOW (ref 8.9–10.3)
Chloride: 98 mmol/L (ref 98–111)
Creatinine, Ser: 3.08 mg/dL — ABNORMAL HIGH (ref 0.61–1.24)
GFR, Estimated: 19 mL/min — ABNORMAL LOW (ref >60)
Glucose, Bld: 219 mg/dL — ABNORMAL HIGH (ref 70–99)
Phosphorus: 2.4 mg/dL — ABNORMAL LOW (ref 2.5–4.6)
Potassium: 3.7 mmol/L (ref 3.5–5.1)
Sodium: 134 mmol/L — ABNORMAL LOW (ref 135–145)

## 2022-07-01 LAB — GLUCOSE, CAPILLARY
Glucose-Capillary: 173 mg/dL — ABNORMAL HIGH (ref 70–99)
Glucose-Capillary: 166 mg/dL — ABNORMAL HIGH (ref 70–99)
Glucose-Capillary: 112 mg/dL — ABNORMAL HIGH (ref 70–99)
Glucose-Capillary: 194 mg/dL — ABNORMAL HIGH (ref 70–99)

## 2022-07-01 MED ORDER — PROSOURCE PLUS PO LIQD
30.0000 mL | Freq: Two times a day (BID) | ORAL | Status: DC
  Administered 2022-07-01 – 2022-07-04 (×6): 30 mL via ORAL
  Filled 2022-07-01 (×5): qty 30

## 2022-07-01 NOTE — Plan of Care (Signed)
  Problem: Fluid Volume: Goal: Compliance with measures to maintain balanced fluid volume will improve Outcome: Not Progressing   Problem: Safety: Goal: Ability to remain free from injury will improve Outcome: Not Progressing

## 2022-07-01 NOTE — Progress Notes (Signed)
PROGRESS NOTE    [name]  UJW:[id number] DOB: [birth year] DOA: 06/22/2022 PCP: Linus Galas, NP   Brief Narrative:  89 year old African-American male history of end-stage renal disease on peritoneal dialysis for last 3 years, moderate aortic stenosis by echocardiogram, type 2 diabetes, coronary disease status post CABG, hypertension, mild cognitive impairment who presented the ER with issues regarding his peritoneal dialysis/PD catheter not working.  Nephrology initially thought that due to the position of PD catheter, him having constipation was likely the source.  He was given bowel prep, had multiple bowel movements but still PD catheter did not work.  IR consulted, temporary dialysis catheter was placed and patient was started on hemodialysis.  Vascular surgery consulted, underwent diagnostic laparoscopy and revision of the peritoneal dialysis catheter on 06/29/2022.  Per nephrology, plan is to continue intermittent hemodialysis for a month, improve his nutrition and then transition back to peritoneal dialysis in a month.  ClIP process started.  Assessment & Plan:   Principal Problem:   Peritoneal dialysis catheter dysfunction (HCC) Active Problems:   Moderate aortic stenosis by prior echocardiogram - mean gradient 24.3 mmHg. peak gradient 42.9 mmHg. Aortic valve area, by VTI measures 1.15  cm.   Essential hypertension   DM2 (diabetes mellitus, type 2) (HCC)   Hypokalemia   ESRD on peritoneal dialysis (HCC)   DNR (do not resuscitate)  ESRD on PD/peritoneal dialysis catheter dysfunction (HCC) CT abdomen shows heavy burden of stool/constipation.  Nephrology consult, their initial opinion was that the position of the PD as well as his constipation was likely the reason for his PD not working, he was given bowel prep, had multiple bowel movements but PD catheter still did not work.  IR was consulted, patient underwent tunneled dialysis catheter on 06/26/2022 and started on intermittent  hemodialysis.  Vascular surgery consulted, patient underwent revision of the PD on 06/29/2022.  Per my discussion with Dr. Arlean Hopping of nephrology, they are planning to continue intermittent dialysis for a month and try to improve his nutrition and then transition to PD after that.  Clip process has been started.  Patient otherwise medically stable.   Moderate aortic stenosis by prior echocardiogram - mean gradient 24.3 mmHg. peak gradient 42.9 mmHg. Aortic valve area, by VTI measures 1.15  cm. Chronic. May need to watch out for hypotension during dialysis.   Hypokalemia Solved.  Per nephrology.   DM2 (diabetes mellitus, type 2) (HCC) Currently on Tradjenta and Semglee 10 units in the morning but continue SSI.  Blood sugar fairly controlled.   Essential hypertension Stable continue current home medications.  DVT prophylaxis: SCDs Start: 06/22/22 2100   Code Status: DNR  Family Communication:  None present at bedside.  Nephrology talking to the wife.  Status is: Inpatient Remains inpatient appropriate because: Nephrology plans to continue intermittent hemodialysis, clip in process.    Estimated body mass index is 23.55 kg/m as calculated from the following:   Height as of this encounter: 5\' 11"  (1.803 m).   Weight as of this encounter: 76.6 kg.    Nutritional Assessment: Body mass index is 23.55 kg/m.Marland Kitchen Seen by dietician.  I agree with the assessment and plan as outlined below: Nutrition Status:        . Skin Assessment: I have examined the patient's skin and I agree with the wound assessment as performed by the wound care RN as outlined below:    Consultants:  Nephrology  Procedures:  None  Antimicrobials:  Anti-infectives (From admission, onward)  Start     Dose/Rate Route Frequency Ordered Stop   06/26/22 0810  ceFAZolin (ANCEF) IVPB 2g/100 mL premix        over 30 Minutes Intravenous Continuous PRN 06/26/22 0812 06/26/22 0830         Subjective: Seen and  examined.  He has no complaints.  Objective: Vitals:   06/30/22 2041 07/01/22 0454 07/01/22 0500 07/01/22 0935  BP: (!) 107/51 (!) 112/57  (!) 111/53  Pulse: 73 65  67  Resp: 20 19  17   Temp: 99.9 F (37.7 C) 98.7 F (37.1 C)  98.4 F (36.9 C)  TempSrc: Oral   Oral  SpO2: 96% 100%  98%  Weight:   76.6 kg   Height:        Intake/Output Summary (Last 24 hours) at 07/01/2022 1211 Last data filed at 07/01/2022 0900 Gross per 24 hour  Intake 1027 ml  Output 2500 ml  Net -1473 ml    Filed Weights   06/28/22 1649 06/30/22 0500 07/01/22 0500  Weight: 82.4 kg 80.5 kg 76.6 kg    Examination:  General exam: Appears calm and comfortable  Respiratory system: Clear to auscultation. Respiratory effort normal. Cardiovascular system: S1 & S2 heard, RRR. No JVD, murmurs, rubs, gallops or clicks. No pedal edema. Gastrointestinal system: Abdomen is nondistended, soft and nontender. No organomegaly or masses felt. Normal bowel sounds heard. Central nervous system: Alert and oriented. No focal neurological deficits. Extremities: Symmetric 5 x 5 power. Skin: No rashes, lesions or ulcers.  Psychiatry: Judgement and insight appear normal. Mood & affect appropriate.   Data Reviewed: I have personally reviewed following labs and imaging studies  CBC: Recent Labs  Lab 06/26/22 0317 06/26/22 1600 06/27/22 1101 06/29/22 0938 06/29/22 1623 06/30/22 0826 07/01/22 0945  WBC 9.3 8.8 12.7*  --  9.2 11.2* 11.0*  NEUTROABS 7.0  --   --   --   --   --   --   HGB 8.2* 8.1* 10.2* 10.2* 9.4* 8.3* 9.5*  HCT 26.9* 26.2* 33.9* 30.0* 31.2* 27.7* 32.1*  MCV 95.4 96.0 96.6  --  96.6 97.2 97.0  PLT 291 272 337  --  290 254 241    Basic Metabolic Panel: Recent Labs  Lab 06/27/22 1101 06/29/22 0938 06/29/22 1623 06/30/22 0730 06/30/22 1452 07/01/22 0945  NA 145 140 140 141 136 134*  K 3.5 4.1 3.3* 3.1* 3.3* 3.7  CL 109 102 106 107 99 98  CO2 23  --  25 26 28 26   GLUCOSE 111* 131* 86 94 100* 219*   BUN 34* 25* 18 21 9 15   CREATININE 3.36* 3.00* 2.98* 3.49* 1.97* 3.08*  CALCIUM 8.9  --  7.7* 7.9* 7.4* 7.9*  PHOS 1.4*  --  2.5 2.7 1.8* 2.4*    GFR: Estimated Creatinine Clearance: 18.3 mL/min (A) (by C-G formula based on SCr of 3.08 mg/dL (H)). Liver Function Tests: Recent Labs  Lab 06/27/22 1101 06/29/22 1623 06/30/22 0730 06/30/22 1452 07/01/22 0945  ALBUMIN 2.5* 2.1* 2.0* 2.0* 2.1*    No results for input(s): "LIPASE", "AMYLASE" in the last 168 hours.  No results for input(s): "AMMONIA" in the last 168 hours. Coagulation Profile: No results for input(s): "INR", "PROTIME" in the last 168 hours. Cardiac Enzymes: No results for input(s): "CKTOTAL", "CKMB", "CKMBINDEX", "TROPONINI" in the last 168 hours. BNP (last 3 results) No results for input(s): "PROBNP" in the last 8760 hours. HbA1C: No results for input(s): "HGBA1C" in the last 72 hours.  CBG: Recent Labs  Lab 06/30/22 1335 06/30/22 1637 06/30/22 2043 07/01/22 0724 07/01/22 1144  GLUCAP 88 113* 161* 173* 166*    Lipid Profile: No results for input(s): "CHOL", "HDL", "LDLCALC", "TRIG", "CHOLHDL", "LDLDIRECT" in the last 72 hours. Thyroid Function Tests: No results for input(s): "TSH", "T4TOTAL", "FREET4", "T3FREE", "THYROIDAB" in the last 72 hours. Anemia Panel: No results for input(s): "VITAMINB12", "FOLATE", "FERRITIN", "TIBC", "IRON", "RETICCTPCT" in the last 72 hours.  Sepsis Labs: No results for input(s): "PROCALCITON", "LATICACIDVEN" in the last 168 hours.  Recent Results (from the past 240 hour(s))  Surgical pcr screen     Status: Abnormal   Collection Time: 06/29/22 12:12 AM   Specimen: Nasal Mucosa; Nasal Swab  Result Value Ref Range Status   MRSA, PCR NEGATIVE NEGATIVE Final   Staphylococcus aureus POSITIVE (A) NEGATIVE Final    Comment: (NOTE) The Xpert SA Assay (FDA approved for NASAL specimens in patients 62 years of age and older), is one component of a comprehensive surveillance  program. It is not intended to diagnose infection nor to guide or monitor treatment. Performed at [facility], [address] [place], [place] [address]   Cath Tip Culture     Status: None (Preliminary result)   Collection Time: 06/29/22  9:02 AM   Specimen: Catheter Tip  Result Value Ref Range Status   Specimen Description CATH TIP  Final   Special Requests NONE  Final   Culture   Final    NO GROWTH < 24 HOURS Performed at [facility], [address], [place], [place] [address]    Report Status PENDING  Incomplete     Radiology Studies: No results found.  Scheduled Meds:  atorvastatin  80 mg Oral Daily   carvedilol  3.125 mg Oral BID WC   Chlorhexidine Gluconate Cloth  6 each Topical Q0600   gentamicin cream  1 Application Topical Daily   hydrALAZINE  50 mg Oral Q12H   insulin aspart  0-5 Units Subcutaneous QHS   insulin aspart  0-6 Units Subcutaneous TID WC   insulin glargine-yfgn  10 Units Subcutaneous Daily   isosorbide mononitrate  30 mg Oral Daily   lactulose  30 g Oral Daily   lidocaine-EPINEPHrine  20 mL Infiltration Once   linagliptin  5 mg Oral Daily   mupirocin ointment  1 Application Nasal BID   pantoprazole  40 mg Oral Daily   polyethylene glycol  17 g Oral BID   Continuous Infusions:  dialysis solution 1.5% low-MG/low-CA     dialysis solution 2.5% low-MG/low-CA Stopped (06/25/22 1803)     LOS: 8 days   Hughie Closs, MD [facility]  07/01/2022, 12:11 PM   *Please note that this is a verbal dictation therefore any spelling or grammatical errors are due to the "Dragon Medical One" system interpretation.  Please page via Amion and do not message via secure chat for urgent patient care matters. Secure chat can be used for non urgent patient care matters.  How to contact the [facility] Attending or Consulting provider 7A - 7P or covering provider during after hours 7P -7A, for this patient?  Check the care team in [facility] and look for a)  attending/consulting [facility] provider listed and b) the [facility] team listed. Page or secure chat 7A-7P. Log into [email or web address] and use [facility] universal password to access. If you do not have the password, please contact the hospital operator. Locate the [facility] provider you are looking for under [facility]  and page to a number that you can be directly reached. If you still have difficulty reaching the provider, please page the [facility] (Director on Call) for the Hospitalists listed on amion for assistance.

## 2022-07-01 NOTE — Progress Notes (Signed)
PD post treatment note  PD treatment completed. Patient tolerated treatment well. PD effluent is clear. No specimen collected.  PD exit site clean, dry and intact. Patient is awake, oriented and in no acute distress.  Report given to bedside nurse.   Post treatment VS:   Total UF removed:    Post treatment weight: 71 kg        07/01/22 1610  Peritoneal Catheter Right lower abdomen  Placement Date/Time: 06/29/22 1105   Serial / Lot #: [id number]  Expiration Date: 09/26/26  Procedural Verification: Medical records & consent reviewed;Relevant studies,results and images reviewed  Time out: Correct Patient;Correct Procedure;Correct S...  Site Assessment Clean, Dry, Intact  Drainage Description None  Catheter status Deaccessed  Dressing Gauze/Drain sponge  Dressing Status Clean, Dry, Intact  Dressing Intervention Dressing reinforced  Completion  Treatment Status Complete  Initial Drain Volume 86  Average Dwell Time-Hour(s) 1  Average Dwell Time-Min(s) 30  Average Drain Time 26  Total Therapy Volume 3000  Total Therapy Time-Hour(s) 12  Total Therapy Time-Min(s) 0  Weight after Drain 156 lb 8.4 oz (71 kg)  Effluent Appearance Clear  Cell Count on Daytime Exchange N/A  Fluid Balance - CCPD  Total UF (+ value on cycler, pt loss) 0 mL  Total UF (- value on cycler, pt gain) 77 mL  Procedure Comments  Tolerated treatment well? Yes  Hand-off documentation  Hand-off Given Given to shift RN/LPN  Report given to (Full Name) Dela, Mardelle Matte, RN  Hand-off Received Received from shift RN/LPN  Report received from (Full Name) Kaven Cumbie. RN

## 2022-07-01 NOTE — Progress Notes (Signed)
[facility] Progress Note   Subjective: No issues with PD catheter reported over night. Continue using low volume with PD tonight.      Objective Vitals:   06/30/22 2041 07/01/22 0454 07/01/22 0500 07/01/22 0935  BP: (!) 107/51 (!) 112/57  (!) 111/53  Pulse: 73 65  67  Resp: 20 19  17   Temp: 99.9 F (37.7 C) 98.7 F (37.1 C)  98.4 F (36.9 C)  TempSrc: Oral   Oral  SpO2: 96% 100%  98%  Weight:   76.6 kg   Height:       Physical Exam General: Pleasant, very elderly male in NAD Heart: S1,S2 3/6 systolic M. SR PACs on monitor.  Lungs: Coarse upper airway lungs sounds otherwise CTAB.  Abdomen: Soft, PD cath Drsg intact RUQ Extremities: No LE edema Dialysis Access: [facility] drsg intact    Additional Objective Labs: Basic Metabolic Panel: Recent Labs  Lab 06/30/22 0730 06/30/22 1452 07/01/22 0945  NA 141 136 134*  K 3.1* 3.3* 3.7  CL 107 99 98  CO2 26 28 26   GLUCOSE 94 100* 219*  BUN 21 9 15   CREATININE 3.49* 1.97* 3.08*  CALCIUM 7.9* 7.4* 7.9*  PHOS 2.7 1.8* 2.4*   Liver Function Tests: Recent Labs  Lab 06/30/22 0730 06/30/22 1452 07/01/22 0945  ALBUMIN 2.0* 2.0* 2.1*   No results for input(s): "LIPASE", "AMYLASE" in the last 168 hours. CBC: Recent Labs  Lab 06/26/22 0317 06/26/22 1600 06/27/22 1101 06/29/22 0938 06/29/22 1623 06/30/22 0826 07/01/22 0945  WBC 9.3 8.8 12.7*  --  9.2 11.2* 11.0*  NEUTROABS 7.0  --   --   --   --   --   --   HGB 8.2* 8.1* 10.2*   < > 9.4* 8.3* 9.5*  HCT 26.9* 26.2* 33.9*   < > 31.2* 27.7* 32.1*  MCV 95.4 96.0 96.6  --  96.6 97.2 97.0  PLT 291 272 337  --  290 254 241   < > = values in this interval not displayed.   Blood Culture    Component Value Date/Time   SDES CATH TIP 06/29/2022 0902   SPECREQUEST NONE 06/29/2022 0902   CULT  06/29/2022 0902    NO GROWTH < 24 HOURS Performed at [facility], [address], [place], [place] [address]    REPTSTATUS PENDING 06/29/2022 6045    Cardiac  Enzymes: No results for input(s): "CKTOTAL", "CKMB", "CKMBINDEX", "TROPONINI" in the last 168 hours. CBG: Recent Labs  Lab 06/30/22 1335 06/30/22 1637 06/30/22 2043 07/01/22 0724 07/01/22 1144  GLUCAP 88 113* 161* 173* 166*   Iron Studies: No results for input(s): "IRON", "TIBC", "TRANSFERRIN", "FERRITIN" in the last 72 hours. @lablastinr3 @ Studies/Results: No results found. Medications:  dialysis solution 1.5% low-MG/low-CA     dialysis solution 2.5% low-MG/low-CA Stopped (06/25/22 1803)    atorvastatin  80 mg Oral Daily   carvedilol  3.125 mg Oral BID WC   Chlorhexidine Gluconate Cloth  6 each Topical Q0600   gentamicin cream  1 Application Topical Daily   hydrALAZINE  50 mg Oral Q12H   insulin aspart  0-5 Units Subcutaneous QHS   insulin aspart  0-6 Units Subcutaneous TID WC   insulin glargine-yfgn  10 Units Subcutaneous Daily   isosorbide mononitrate  30 mg Oral Daily   lactulose  30 g Oral Daily   lidocaine-EPINEPHrine  20 mL Infiltration Once   linagliptin  5 mg Oral Daily   mupirocin ointment  1 Application Nasal BID   pantoprazole  40 mg Oral Daily   polyethylene glycol  17 g Oral BID          Attestation signed by Delano Metz, MD at 06/30/2022  3:18 PM   Spoke w/ pt's renal MD, Dr Signe Colt. She states the family and patient are very much against doing hemodialysis, hx of severe AS, other comorbidities. So plan is to try this new revised PD cath w/ low volumes (500-1000cc) for now and hopefully he can stay on CPPD. Will leave the [facility] placed [facility] if for now just in case there are sig problems with PD going forward.    Pt seen, examined and agree w A/P as above.  Vinson Moselle  MD 06/30/2022, 3:18 PM            Expand All Collapse All    [facility] Progress Note    Subjective: S/P revision of PD 06/29/2022 per Dr. Lenell Antu. On HD very pleasant, oriented to self and place. Very low K+ with frequent PVCs on monitor. Replete K+ change to 4.0 K  bat   Objective       Vitals:    06/30/22 0800 06/30/22 0803 06/30/22 0830 06/30/22 0900  BP: (!) 111/53 (!) 128/56 114/63 (!) 119/59  Pulse:   73 69 74  Resp: (!) 25 (!) 26 13 (!) 22  Temp:          TempSrc:          SpO2:   96% 96% 99%  Weight:          Height:            Physical Exam General: Pleasant, very elderly male in NAD Heart: S1,S2 3/6 systolic M. SR PACs on monitor.  Lungs: Coarse upper airway lungs sounds otherwise CTAB.  Abdomen: Soft, Drsg intact RUQ Extremities: No LE edema Dialysis Access: [facility] drsg intact     Additional Objective Labs: Basic Metabolic Panel: Last Labs        Recent Labs  Lab 06/27/22 1101 06/29/22 0938 06/29/22 1623 06/30/22 0730  NA 145 140 140 141  K 3.5 4.1 3.3* 3.1*  CL 109 102 106 107  CO2 23  --  25 26  GLUCOSE 111* 131* 86 94  BUN 34* 25* 18 21  CREATININE 3.36* 3.00* 2.98* 3.49*  CALCIUM 8.9  --  7.7* 7.9*  PHOS 1.4*  --  2.5 2.7      Liver Function Tests: Last Labs         Recent Labs  Lab 06/23/22 1131 06/24/22 1006 06/27/22 1101 06/29/22 1623 06/30/22 0730  AST 29  --   --   --   --   ALT 15  --   --   --   --   ALKPHOS 53  --   --   --   --   BILITOT 0.8  --   --   --   --   PROT 7.4  --   --   --   --   ALBUMIN 2.7*   < > 2.5* 2.1* 2.0*   < > = values in this interval not displayed.      Last Labs  No results for input(s): "LIPASE", "AMYLASE" in the last 168 hours.   CBC: Last Labs          Recent Labs  Lab 06/26/22 0317 06/26/22 1600 06/27/22 1101 06/29/22 0938 06/29/22 1623 06/30/22 0826  WBC 9.3  8.8 12.7*  --  9.2 11.2*  NEUTROABS 7.0  --   --   --   --   --   HGB 8.2* 8.1* 10.2* 10.2* 9.4* 8.3*  HCT 26.9* 26.2* 33.9* 30.0* 31.2* 27.7*  MCV 95.4 96.0 96.6  --  96.6 97.2  PLT 291 272 337  --  290 254      Blood Culture Labs (Brief)           Component Value Date/Time    SDES CATH TIP 06/29/2022 0902    SPECREQUEST NONE 06/29/2022 0902    CULT   06/29/2022 0902      NO  GROWTH < 24 HOURS Performed at [facility], [address] [place], [place] [address]      REPTSTATUS PENDING 06/29/2022 0902        Cardiac Enzymes: Last Labs  No results for input(s): "CKTOTAL", "CKMB", "CKMBINDEX", "TROPONINI" in the last 168 hours.   CBG: Last Labs         Recent Labs  Lab 06/29/22 1213 06/29/22 1630 06/29/22 2121 06/30/22 0656 06/30/22 0725  GLUCAP 102* 87 80 71 73      Iron Studies:  Recent Labs (last 2 labs)  No results for input(s): "IRON", "TIBC", "TRANSFERRIN", "FERRITIN" in the last 72 hours.   @lablastinr3 @ Studies/Results: Imaging Results (Last 48 hours)  No results found.   Medications:  anticoagulant sodium citrate     dialysis solution 2.5% low-MG/low-CA Stopped (06/25/22 1803)   ferric gluconate (FERRLECIT) IVPB 250 mg (06/28/22 0912)     atorvastatin  80 mg Oral Daily   carvedilol  3.125 mg Oral BID WC   Chlorhexidine Gluconate Cloth  6 each Topical Q0600   gentamicin cream  1 Application Topical Daily   hydrALAZINE  50 mg Oral Q12H   insulin aspart  0-5 Units Subcutaneous QHS   insulin aspart  0-6 Units Subcutaneous TID WC   insulin glargine-yfgn  8 Units Subcutaneous BID   isosorbide mononitrate  30 mg Oral Daily   lactulose  30 g Oral Daily   lidocaine-EPINEPHrine  20 mL Infiltration Once   linagliptin  5 mg Oral Daily   mupirocin ointment  1 Application Nasal BID   pantoprazole  40 mg Oral Daily   polyethylene glycol  17 g Oral BID        Outpatient Dialysis Orders: [facility] PD CCPD 5 exchanges 2.5L dwell 1 hr 30 min dwell time No pause, no day dwell     Assessment/Plan: 1 Underdialysis/ PD catheter malfunction/ cognitive decline:  -Pt and wife's primary goal is to remain at home.  CT showed large stool burden and PD catheter in the anterior right lower quadrant adjacent to small bowel loops. Completed bowel regimen but multiple attempts to drain unsuccessful. Still having intermittent constipation but had  multiple BMs yesterday and reports stomach feels better.  -TDC placed by IR for hopefully temporary HD while determining cause for PD catheter dysfunction. Had 2 hour HD Monday and tolerated well. HD today. Very low albumin 2.0, not sure he is candidate to continue PD whether PD catheter functions or not. Revision of PD catheter 06/29/2022. Per [facility] note, if PD catheter does not function post revision 06/29/2022, they would not recommend further efforts at PD .      2 ESRD: on PD, see above in #1.  On HD temporarily. MWF schedule for now. I have placed HD orders for tomorrow, will be beneficial to try to optimize volume  status with HD. Ability to resume PD will depend on outcome of surgery.  We will flush with low volume tonight.    3. Hypokalemia - K+ 3.7 this AM. Change tor regular diet as K+ and PO4 are both low.   4 Hypertension/ volume: overloaded, Ordered lasix to help improve volume. Resolving LE edema, no respiratory distress. BP improved this AM, on carvedilol 3.125mg  BID and hydralazine 50mg  BID. UF with HD as tolerated.    5. Anemia of ESRD: Last Hgb 10.2. tsat 9%, receiving IV iron course.   6. Metabolic Bone Disease: Corrected Ca high, hold calcitriol for now.  Phos low. Was on Auryxia which has been DC'd. Give K+ phos- now 2.7. Does not really need a renal diet, changed to heart healthy/carb modified. Continued fluid restrictions.   7.Protein Calorie Malnutrition: Very low albumin. Start on protein supplements here.    8.  Dispo: Patient and family very much opposed to IHD. Discussed with Dr. Signe Colt. We will proceed with PD, initially using low volumes post PD catheter revision. He will need trained person to assist with PD at home.      Bonham Zingale H. Iesha Summerhill NP-C 07/01/2022, 12:12 PM  [employer] [phone]

## 2022-07-02 DIAGNOSIS — T85611A Breakdown (mechanical) of intraperitoneal dialysis catheter, initial encounter: Secondary | ICD-10-CM | POA: Diagnosis not present

## 2022-07-02 LAB — RENAL FUNCTION PANEL
Albumin: 1.7 g/dL — ABNORMAL LOW (ref 3.5–5.0)
Anion gap: 6 (ref 5–15)
BUN: 18 mg/dL (ref 8–23)
CO2: 26 mmol/L (ref 22–32)
Calcium: 7.8 mg/dL — ABNORMAL LOW (ref 8.9–10.3)
Chloride: 103 mmol/L (ref 98–111)
Creatinine, Ser: 3.69 mg/dL — ABNORMAL HIGH (ref 0.61–1.24)
GFR, Estimated: 15 mL/min — ABNORMAL LOW (ref >60)
Glucose, Bld: 236 mg/dL — ABNORMAL HIGH (ref 70–99)
Phosphorus: 2.4 mg/dL — ABNORMAL LOW (ref 2.5–4.6)
Potassium: 3.1 mmol/L — ABNORMAL LOW (ref 3.5–5.1)
Sodium: 135 mmol/L (ref 135–145)

## 2022-07-02 LAB — CATH TIP CULTURE: Culture: NO GROWTH

## 2022-07-02 LAB — CBC
HCT: 26.5 % — ABNORMAL LOW (ref 39.0–52.0)
Hemoglobin: 8 g/dL — ABNORMAL LOW (ref 13.0–17.0)
MCH: 29.2 pg (ref 26.0–34.0)
MCHC: 30.2 g/dL (ref 30.0–36.0)
MCV: 96.7 fL (ref 80.0–100.0)
Platelets: 193 10*3/uL (ref 150–400)
RBC: 2.74 MIL/uL — ABNORMAL LOW (ref 4.22–5.81)
RDW: 18.1 % — ABNORMAL HIGH (ref 11.5–15.5)
WBC: 8.6 10*3/uL (ref 4.0–10.5)
nRBC: 0 % (ref 0.0–0.2)

## 2022-07-02 LAB — GLUCOSE, CAPILLARY
Glucose-Capillary: 151 mg/dL — ABNORMAL HIGH (ref 70–99)
Glucose-Capillary: 247 mg/dL — ABNORMAL HIGH (ref 70–99)
Glucose-Capillary: 227 mg/dL — ABNORMAL HIGH (ref 70–99)
Glucose-Capillary: 185 mg/dL — ABNORMAL HIGH (ref 70–99)

## 2022-07-02 MED ORDER — CHLORHEXIDINE GLUCONATE CLOTH 2 % EX PADS
6.0000 | MEDICATED_PAD | Freq: Every day | CUTANEOUS | Status: DC
  Administered 2022-07-03 – 2022-07-04 (×2): 6 via TOPICAL

## 2022-07-02 MED ORDER — POTASSIUM CHLORIDE 20 MEQ PO PACK
20.0000 meq | PACK | Freq: Once | ORAL | Status: AC
  Administered 2022-07-02: 20 meq via ORAL
  Filled 2022-07-02: qty 1

## 2022-07-02 MED ORDER — GENTAMICIN SULFATE 0.1 % EX CREA
1.0000 | TOPICAL_CREAM | Freq: Every day | CUTANEOUS | Status: DC
  Administered 2022-07-02 – 2022-07-04 (×2): 1 via TOPICAL
  Filled 2022-07-02: qty 15

## 2022-07-02 MED ORDER — DELFLEX-LC/1.5% DEXTROSE 344 MOSM/L IP SOLN: INTRAPERITONEAL | Status: DC

## 2022-07-02 MED ORDER — POTASSIUM CHLORIDE CRYS ER 20 MEQ PO TBCR
20.0000 meq | EXTENDED_RELEASE_TABLET | Freq: Once | ORAL | Status: AC
  Administered 2022-07-02: 20 meq via ORAL
  Filled 2022-07-02: qty 1

## 2022-07-02 MED ORDER — HYDRALAZINE HCL 25 MG PO TABS
25.0000 mg | ORAL_TABLET | Freq: Two times a day (BID) | ORAL | Status: DC
  Administered 2022-07-02 – 2022-07-04 (×4): 25 mg via ORAL
  Filled 2022-07-02 (×4): qty 1

## 2022-07-02 MED ORDER — DARBEPOETIN ALFA 40 MCG/0.4ML IJ SOSY
40.0000 ug | PREFILLED_SYRINGE | INTRAMUSCULAR | Status: DC
  Administered 2022-07-02: 40 ug via SUBCUTANEOUS
  Filled 2022-07-02: qty 0.4

## 2022-07-02 NOTE — Progress Notes (Signed)
Paged Odie Sera, MD about potassium of 3.1.

## 2022-07-02 NOTE — Progress Notes (Signed)
[name]  ZOX:[id number] DOB: [birth year] DOA: 06/22/2022 PCP: Linus Galas, NP   Brief Narrative:  89 year old African-American male history of end-stage renal disease on peritoneal dialysis for last 3 years, moderate aortic stenosis by echocardiogram, type 2 diabetes, coronary disease status post CABG, hypertension, mild cognitive impairment who presented the ER with issues regarding his peritoneal dialysis/PD catheter not working.  Nephrology initially thought that due to the position of PD catheter, him having constipation was likely the source.  He was given bowel prep, had multiple bowel movements but still PD catheter did not work.  IR consulted, temporary dialysis catheter was placed and patient was started on hemodialysis.  Vascular surgery consulted, underwent diagnostic laparoscopy and revision of the peritoneal dialysis catheter on 06/29/2022.  Per nephrology, plan is to continue intermittent hemodialysis for a month, improve his nutrition and then transition back to peritoneal dialysis in a month.  ClIP process started.  Assessment & Plan:   Principal Problem:   Peritoneal dialysis catheter dysfunction (HCC) Active Problems:   Moderate aortic stenosis by prior echocardiogram - mean gradient 24.3 mmHg. peak gradient 42.9 mmHg. Aortic valve area, by VTI measures 1.15  cm.   Essential hypertension   DM2 (diabetes mellitus, type 2) (HCC)   Hypokalemia   ESRD on peritoneal dialysis (HCC)   DNR (do not resuscitate)  ESRD on PD/peritoneal dialysis catheter dysfunction (HCC) CT abdomen shows heavy burden of stool/constipation.  Nephrology consult, their initial opinion was that the position of the PD as well as his constipation was likely the reason for his PD not working, he was given bowel prep, had multiple bowel movements but PD catheter still did not work.  IR was consulted, patient underwent tunneled dialysis catheter on 06/26/2022 and started on intermittent  hemodialysis.  Vascular surgery consulted, patient underwent revision of the PD on 06/29/2022.  Nephrology was initially planning to do IHD for a month and then back to PD and clip process was started however I was informed today that family did not agree to that so nephrology was planning to resume PD back and discharge tomorrow morning but found out that patient's PD catheter was not working properly last night again.  Vascular surgery had already mentioned in the last note that they would not attempt redoing his PD catheter again.  Nephrology to discuss with the family and come up with a solution.  In the meantime, he will be on IHD.  Appreciate nephrology help.   Moderate aortic stenosis by prior echocardiogram - mean gradient 24.3 mmHg. peak gradient 42.9 mmHg. Aortic valve area, by VTI measures 1.15  cm. Intermittent hypotension but asymptomatic.   Hypokalemia resolved.  Per nephrology.   DM2 (diabetes mellitus, type 2) (HCC) Currently on Tradjenta and Semglee 10 units in the morning but continue SSI.  Blood sugar fairly controlled.   Essential hypertension Stable continue current home medications.  DVT prophylaxis: SCDs Start: 06/22/22 2100   Code Status: DNR  Family Communication:  None present at bedside.  Nephrology talking to the wife.  Status is: Inpatient Remains inpatient appropriate because: PD is still not working.    Estimated body mass index is 23.09 kg/m as calculated from the following:   Height as of this encounter: 5\' 11"  (1.803 m).   Weight as of this encounter: 75.1 kg.    Nutritional Assessment: Body mass index is 23.09 kg/m.Marland Kitchen Seen by dietician.  I agree with the assessment and plan as outlined below: Nutrition Status:        .  Skin Assessment: I have examined the patient's skin and I agree with the wound assessment as performed by the wound care RN as outlined below:    Consultants:  Nephrology  Procedures:  None  Antimicrobials:   Anti-infectives (From admission, onward)    Start     Dose/Rate Route Frequency Ordered Stop   06/26/22 0810  ceFAZolin (ANCEF) IVPB 2g/100 mL premix        over 30 Minutes Intravenous Continuous PRN 06/26/22 0812 06/26/22 0830         Subjective: Patient seen and examined.  He has no complaints.  Objective: Vitals:   07/02/22 0431 07/02/22 0532 07/02/22 0745 07/02/22 0927  BP: (!) 112/59   109/60  Pulse: (!) 59   72  Resp: 19   18  Temp: 98.2 F (36.8 C)   98 F (36.7 C)  TempSrc: Oral   Oral  SpO2: 99%   98%  Weight:  74.8 kg 75.1 kg   Height:        Intake/Output Summary (Last 24 hours) at 07/02/2022 1105 Last data filed at 07/02/2022 0900 Gross per 24 hour  Intake -27 ml  Output 300 ml  Net -327 ml    Filed Weights   07/01/22 0500 07/02/22 0532 07/02/22 0745  Weight: 76.6 kg 74.8 kg 75.1 kg    Examination:  General exam: Appears calm and comfortable  Respiratory system: Clear to auscultation. Respiratory effort normal. Cardiovascular system: S1 & S2 heard, RRR. No JVD, murmurs, rubs, gallops or clicks. No pedal edema. Gastrointestinal system: Abdomen is nondistended, soft and nontender. No organomegaly or masses felt. Normal bowel sounds heard. Central nervous system: Alert and oriented. No focal neurological deficits. Extremities: Symmetric 5 x 5 power. Skin: No rashes, lesions or ulcers.  Psychiatry: Judgement and insight appear poor  Data Reviewed: I have personally reviewed following labs and imaging studies  CBC: Recent Labs  Lab 06/26/22 0317 06/26/22 1600 06/27/22 1101 06/29/22 0938 06/29/22 1623 06/30/22 0826 07/01/22 0945 07/02/22 0039  WBC 9.3   < > 12.7*  --  9.2 11.2* 11.0* 8.6  NEUTROABS 7.0  --   --   --   --   --   --   --   HGB 8.2*   < > 10.2* 10.2* 9.4* 8.3* 9.5* 8.0*  HCT 26.9*   < > 33.9* 30.0* 31.2* 27.7* 32.1* 26.5*  MCV 95.4   < > 96.6  --  96.6 97.2 97.0 96.7  PLT 291   < > 337  --  290 254 241 193   < > = values in this  interval not displayed.    Basic Metabolic Panel: Recent Labs  Lab 06/29/22 1623 06/30/22 0730 06/30/22 1452 07/01/22 0945 07/02/22 0039  NA 140 141 136 134* 135  K 3.3* 3.1* 3.3* 3.7 3.1*  CL 106 107 99 98 103  CO2 25 26 28 26 26   GLUCOSE 86 94 100* 219* 236*  BUN 18 21 9 15 18   CREATININE 2.98* 3.49* 1.97* 3.08* 3.69*  CALCIUM 7.7* 7.9* 7.4* 7.9* 7.8*  PHOS 2.5 2.7 1.8* 2.4* 2.4*    GFR: Estimated Creatinine Clearance: 15.3 mL/min (A) (by C-G formula based on SCr of 3.69 mg/dL (H)). Liver Function Tests: Recent Labs  Lab 06/29/22 1623 06/30/22 0730 06/30/22 1452 07/01/22 0945 07/02/22 0039  ALBUMIN 2.1* 2.0* 2.0* 2.1* 1.7*    No results for input(s): "LIPASE", "AMYLASE" in the last 168 hours.  No results for input(s): "AMMONIA" in the  last 168 hours. Coagulation Profile: No results for input(s): "INR", "PROTIME" in the last 168 hours. Cardiac Enzymes: No results for input(s): "CKTOTAL", "CKMB", "CKMBINDEX", "TROPONINI" in the last 168 hours. BNP (last 3 results) No results for input(s): "PROBNP" in the last 8760 hours. HbA1C: No results for input(s): "HGBA1C" in the last 72 hours.  CBG: Recent Labs  Lab 07/01/22 0724 07/01/22 1144 07/01/22 1639 07/01/22 1953 07/02/22 0741  GLUCAP 173* 166* 112* 194* 151*    Lipid Profile: No results for input(s): "CHOL", "HDL", "LDLCALC", "TRIG", "CHOLHDL", "LDLDIRECT" in the last 72 hours. Thyroid Function Tests: No results for input(s): "TSH", "T4TOTAL", "FREET4", "T3FREE", "THYROIDAB" in the last 72 hours. Anemia Panel: No results for input(s): "VITAMINB12", "FOLATE", "FERRITIN", "TIBC", "IRON", "RETICCTPCT" in the last 72 hours.  Sepsis Labs: No results for input(s): "PROCALCITON", "LATICACIDVEN" in the last 168 hours.  Recent Results (from the past 240 hour(s))  Surgical pcr screen     Status: Abnormal   Collection Time: 06/29/22 12:12 AM   Specimen: Nasal Mucosa; Nasal Swab  Result Value Ref Range Status    MRSA, PCR NEGATIVE NEGATIVE Final   Staphylococcus aureus POSITIVE (A) NEGATIVE Final    Comment: (NOTE) The Xpert SA Assay (FDA approved for NASAL specimens in patients 39 years of age and older), is one component of a comprehensive surveillance program. It is not intended to diagnose infection nor to guide or monitor treatment. Performed at [facility], [address], [place], [place] [address]   Cath Tip Culture     Status: None (Preliminary result)   Collection Time: 06/29/22  9:02 AM   Specimen: Catheter Tip  Result Value Ref Range Status   Specimen Description CATH TIP  Final   Special Requests NONE  Final   Culture   Final    NO GROWTH 2 DAYS Performed at [facility], [address], [place], [place] [address]    Report Status PENDING  Incomplete     Radiology Studies: No results found.  Scheduled Meds:  (feeding supplement) PROSource Plus  30 mL Oral BID BM   atorvastatin  80 mg Oral Daily   carvedilol  3.125 mg Oral BID WC   Chlorhexidine Gluconate Cloth  6 each Topical Q0600   gentamicin cream  1 Application Topical Daily   hydrALAZINE  50 mg Oral Q12H   insulin aspart  0-5 Units Subcutaneous QHS   insulin aspart  0-6 Units Subcutaneous TID WC   insulin glargine-yfgn  10 Units Subcutaneous Daily   isosorbide mononitrate  30 mg Oral Daily   lactulose  30 g Oral Daily   lidocaine-EPINEPHrine  20 mL Infiltration Once   linagliptin  5 mg Oral Daily   mupirocin ointment  1 Application Nasal BID   pantoprazole  40 mg Oral Daily   polyethylene glycol  17 g Oral BID   Continuous Infusions:  dialysis solution 1.5% low-MG/low-CA     dialysis solution 2.5% low-MG/low-CA Stopped (06/25/22 1803)     LOS: 9 days   Hughie Closs, MD [facility]  07/02/2022, 11:05 AM   *Please note that this is a verbal dictation therefore any spelling or grammatical errors are due to the "Dragon Medical One" system interpretation.  Please page via Amion and  do not message via secure chat for urgent patient care matters. Secure chat can be used for non urgent patient care matters.  How to contact the [facility] Attending or Consulting provider 7A - 7P or covering provider  during after hours 7P -7A, for this patient?  Check the care team in [facility] and look for a) attending/consulting [facility] provider listed and b) the [facility] team listed. Page or secure chat 7A-7P. Log into [email or web address] and use [facility]'s universal password to access. If you do not have the password, please contact the hospital operator. Locate the [facility] provider you are looking for under [facility] and page to a number that you can be directly reached. If you still have difficulty reaching the provider, please page the [facility] (Director on Call) for the Hospitalists listed on amion for assistance.

## 2022-07-02 NOTE — Progress Notes (Signed)
PD post treatment note  PD treatment not completed as prescribed, 3 out of 6 circles completed, was unable to completely drain the third circles. Patient tolerated treatment well. PD effluent is clear. No specimen collected.  PD exit site clean, dry and intact. Patient is awake, oriented and in no acute distress.  Report given to bedside nurse.     07/02/22 0735  Peritoneal Catheter Right lower abdomen  Placement Date/Time: 06/29/22 1105   Serial / Lot #: [id number]  Expiration Date: 09/26/26  Procedural Verification: Medical records & consent reviewed;Relevant studies,results and images reviewed  Time out: Correct Patient;Correct Procedure;Correct S...  Site Assessment Clean, Dry, Intact  Drainage Description None  Catheter status Deaccessed  Dressing Gauze/Drain sponge  Dressing Status Clean, Dry, Intact  Dressing Intervention Dressing reinforced  Completion  Treatment Status Complete  Initial Drain Volume 67  Average Dwell Time-Hour(s) 1  Average Dwell Time-Min(s) 30  Average Drain Time 70  Total Therapy Volume 3001  Total Therapy Time-Hour(s) 9  Total Therapy Time-Min(s) 31  Weight after Drain 165 lb 9.1 oz (75.1 kg)  Effluent Appearance Clear  Cell Count on Daytime Exchange N/A  Fluid Balance - CCPD  Total UF (+ value on cycler, pt loss) 0 mL  Total UF (- value on cycler, pt gain) -385 mL  Procedure Comments  Tolerated treatment well? Yes  Peritoneal Dialysis Comments tx not achieved as expected, 3out of 6 circles completed, was unable to completely drain the third circle.  Education / Care Plan  Dialysis Education Provided Yes  Documented Education in Care Plan Yes  Hand-off documentation  Hand-off Given Given to shift RN/LPN  Report given to (Full Name) Vinson Moselle, RN  Hand-off Received Received from shift RN/LPN  Report received from (Full Name) Betheny Suchecki, RN

## 2022-07-02 NOTE — Progress Notes (Signed)
Came down to flush PD catheter and was not successful. Pt was sitting in chair when we began, had pt get into bed to see if a different position would be successful. Unfortunately the catheter isnt working. Dr. Arlean Hopping notified and Alonna Buckler NP

## 2022-07-02 NOTE — Progress Notes (Addendum)
[facility] Progress Note   Subjective: Apparently only had 3 cycles of PD last PM. 3rd cycle would not completely drain. PD catheter without obvious kinks. Will manually fluid PD catheter and attempt PD again tonight. If unsuccessful, will need order for HD tomorrow.  K+ 3.1 today. SCr 3.89  Objective Vitals:   07/02/22 0431 07/02/22 0532 07/02/22 0745 07/02/22 0927  BP: (!) 112/59   109/60  Pulse: (!) 59   72  Resp: 19   18  Temp: 98.2 F (36.8 C)   98 F (36.7 C)  TempSrc: Oral   Oral  SpO2: 99%   98%  Weight:  74.8 kg 75.1 kg   Height:       Physical Exam General: Pleasant, very elderly male in NAD Heart: S1,S2 3/6 systolic M. SR PACs on monitor.  Lungs: Coarse upper airway lungs sounds otherwise CTAB.  Abdomen: Soft, PD cath Drsg intact RUQ Extremities: No LE edema Dialysis Access: [facility] drsg intact  Additional Objective Labs: Basic Metabolic Panel: Recent Labs  Lab 06/30/22 1452 07/01/22 0945 07/02/22 0039  NA 136 134* 135  K 3.3* 3.7 3.1*  CL 99 98 103  CO2 28 26 26   GLUCOSE 100* 219* 236*  BUN 9 15 18   CREATININE 1.97* 3.08* 3.69*  CALCIUM 7.4* 7.9* 7.8*  PHOS 1.8* 2.4* 2.4*   Liver Function Tests: Recent Labs  Lab 06/30/22 1452 07/01/22 0945 07/02/22 0039  ALBUMIN 2.0* 2.1* 1.7*   No results for input(s): "LIPASE", "AMYLASE" in the last 168 hours. CBC: Recent Labs  Lab 06/26/22 0317 06/26/22 1600 06/27/22 1101 06/29/22 0938 06/29/22 1623 06/30/22 0826 07/01/22 0945 07/02/22 0039  WBC 9.3   < > 12.7*  --  9.2 11.2* 11.0* 8.6  NEUTROABS 7.0  --   --   --   --   --   --   --   HGB 8.2*   < > 10.2*   < > 9.4* 8.3* 9.5* 8.0*  HCT 26.9*   < > 33.9*   < > 31.2* 27.7* 32.1* 26.5*  MCV 95.4   < > 96.6  --  96.6 97.2 97.0 96.7  PLT 291   < > 337  --  290 254 241 193   < > = values in this interval not displayed.   Blood Culture    Component Value Date/Time   SDES CATH TIP 06/29/2022 0902   SPECREQUEST NONE 06/29/2022 0902    CULT  06/29/2022 0902    NO GROWTH 2 DAYS Performed at [facility], [address] [place], [place] [address]    REPTSTATUS PENDING 06/29/2022 6045    Cardiac Enzymes: No results for input(s): "CKTOTAL", "CKMB", "CKMBINDEX", "TROPONINI" in the last 168 hours. CBG: Recent Labs  Lab 07/01/22 1144 07/01/22 1639 07/01/22 1953 07/02/22 0741 07/02/22 1124  GLUCAP 166* 112* 194* 151* 247*   Iron Studies: No results for input(s): "IRON", "TIBC", "TRANSFERRIN", "FERRITIN" in the last 72 hours. @lablastinr3 @ Studies/Results: No results found. Medications:  dialysis solution 1.5% low-MG/low-CA     dialysis solution 2.5% low-MG/low-CA Stopped (06/25/22 1803)    (feeding supplement) PROSource Plus  30 mL Oral BID BM   atorvastatin  80 mg Oral Daily   carvedilol  3.125 mg Oral BID WC   Chlorhexidine Gluconate Cloth  6 each Topical Q0600   gentamicin cream  1 Application Topical Daily   hydrALAZINE  50 mg Oral Q12H   insulin aspart  0-5 Units Subcutaneous QHS  insulin aspart  0-6 Units Subcutaneous TID WC   insulin glargine-yfgn  10 Units Subcutaneous Daily   isosorbide mononitrate  30 mg Oral Daily   lactulose  30 g Oral Daily   lidocaine-EPINEPHrine  20 mL Infiltration Once   linagliptin  5 mg Oral Daily   mupirocin ointment  1 Application Nasal BID   pantoprazole  40 mg Oral Daily   polyethylene glycol  17 g Oral BID     Outpatient Dialysis Orders: [facility] PD CCPD 5 exchanges 2.5L dwell 1 hr 30 min dwell time No pause, no day dwell     Assessment/Plan: 1 Underdialysis/ PD catheter malfunction/ cognitive decline:  -Pt and wife's primary goal is to remain at home.  CT showed large stool burden and PD catheter in the anterior right lower quadrant adjacent to small bowel loops. Completed bowel regimen but multiple attempts to drain unsuccessful. Still having intermittent constipation but had multiple BMs yesterday and reports stomach feels better.  -TDC placed by IR for  hopefully temporary HD while determining cause for PD catheter dysfunction. Had 2 hour HD Monday and tolerated well. HD today. Revision of PD catheter 06/29/2022. Per [facility] note, if PD catheter does not function post revision 06/29/2022, they would not recommend further efforts at PD .  - 3rd cycle did not completely drain 07/01/2022. Manually flush PD catheter today and attempt PD again tonight. See RN notes from this afternoon, unable to flush or drain the newly revised PD catheter. Cannot do PD tonight and most likely will have to abandon PD altogether. Please update Dr Signe Colt tomorrow. Will need hemodialysis tomorrow. TDC was kept in place in case this happened.    2 ESRD: on PD, see above in #1.  Patient had PD 06/30/2022 very low volume 6 exchanges 1.5%  500cc each. Tolerated well. 07/01/2022  increased volume to 1.5% 1L 6 exchanges but had issues with draining 3rd cycle. PD cath didn't work well last night and today is not flushing at all. Plan is for HD tomorrow.    3. Hypokalemia - K+ 3.1 this AM. Change tor regular diet as K+ and PO4 are both low. He was given KCL 20 MEQ this AM per primary. Will repeat 20 MEQ KCL PO now for total of 40 MEQ today. Daily labs ordered.     4 Hypertension/ volume: Volume is stable. BP soft this AM. Decrease hydralazine to 25 mg PO BID. Doing low volume PD, weight actually lower today than post HD 06/30/2022. Monitor.    5. Anemia of ESRD: Last Hgb 8.0. tsat 9%, receiving IV iron course. Give Aranesp 40 mcg SQ 07/02/2022.    6. Metabolic Bone Disease: Corrected Ca 9.6  Phos low. Was on Auryxia which has been DC'd.  Does not really need a renal diet, changed to changed to regular diet.  7.Protein Calorie Malnutrition: Very low albumin. Start on protein supplements here.    8.  Dispo: Patient and family very much opposed to IHD. Discussed with Dr. Signe Colt. We will proceed with PD, initially using low volumes post PD catheter revision. He will need trained person to  assist with PD at home.   Rita H. Brown NP-C 07/02/2022, 12:47 PM  [facility] [phone]  Pt seen, examined and agree w assess/plan as above with additions as indicated.  [facility] 07/02/2022, 7:11 PM

## 2022-07-02 NOTE — Plan of Care (Signed)
  Problem: Tissue Perfusion: Goal: Adequacy of tissue perfusion will improve Outcome: Not Progressing   Problem: Safety: Goal: Ability to remain free from injury will improve Outcome: Not Progressing

## 2022-07-03 LAB — GLUCOSE, CAPILLARY
Glucose-Capillary: 144 mg/dL — ABNORMAL HIGH (ref 70–99)
Glucose-Capillary: 117 mg/dL — ABNORMAL HIGH (ref 70–99)
Glucose-Capillary: 160 mg/dL — ABNORMAL HIGH (ref 70–99)
Glucose-Capillary: 158 mg/dL — ABNORMAL HIGH (ref 70–99)

## 2022-07-03 LAB — RENAL FUNCTION PANEL
Albumin: 1.8 g/dL — ABNORMAL LOW (ref 3.5–5.0)
Anion gap: 8 (ref 5–15)
BUN: 22 mg/dL (ref 8–23)
CO2: 26 mmol/L (ref 22–32)
Calcium: 7.9 mg/dL — ABNORMAL LOW (ref 8.9–10.3)
Chloride: 104 mmol/L (ref 98–111)
Creatinine, Ser: 4.35 mg/dL — ABNORMAL HIGH (ref 0.61–1.24)
GFR, Estimated: 13 mL/min — ABNORMAL LOW (ref >60)
Glucose, Bld: 170 mg/dL — ABNORMAL HIGH (ref 70–99)
Phosphorus: 2.6 mg/dL (ref 2.5–4.6)
Potassium: 3.5 mmol/L (ref 3.5–5.1)
Sodium: 138 mmol/L (ref 135–145)

## 2022-07-03 LAB — CBC
HCT: 28.7 % — ABNORMAL LOW (ref 39.0–52.0)
Hemoglobin: 8.7 g/dL — ABNORMAL LOW (ref 13.0–17.0)
MCH: 28.7 pg (ref 26.0–34.0)
MCHC: 30.3 g/dL (ref 30.0–36.0)
MCV: 94.7 fL (ref 80.0–100.0)
Platelets: 183 10*3/uL (ref 150–400)
RBC: 3.03 MIL/uL — ABNORMAL LOW (ref 4.22–5.81)
RDW: 17.8 % — ABNORMAL HIGH (ref 11.5–15.5)
WBC: 8.7 10*3/uL (ref 4.0–10.5)
nRBC: 0 % (ref 0.0–0.2)

## 2022-07-03 MED ORDER — CHLORHEXIDINE GLUCONATE CLOTH 2 % EX PADS
6.0000 | MEDICATED_PAD | Freq: Every day | CUTANEOUS | Status: DC
  Administered 2022-07-03 – 2022-07-04 (×2): 6 via TOPICAL

## 2022-07-03 MED ORDER — HEPARIN SODIUM (PORCINE) 1000 UNIT/ML IJ SOLN
INTRAMUSCULAR | Status: AC
  Filled 2022-07-03: qty 6

## 2022-07-03 MED ORDER — HEPARIN SODIUM (PORCINE) 1000 UNIT/ML DIALYSIS
2000.0000 [IU] | INTRAMUSCULAR | Status: DC | PRN
  Administered 2022-07-03: 2000 [IU] via INTRAVENOUS_CENTRAL

## 2022-07-03 NOTE — Progress Notes (Signed)
[facility] Progress Note [name] ZOX:[id number] DOB: 12-01-1933 DOA: 06/22/2022  DOS: the patient was seen and examined on 07/03/2022  Brief hospital course: 89 year old African-American male history of end-stage renal disease on peritoneal dialysis for last 3 years, moderate aortic stenosis by echocardiogram, type 2 diabetes, coronary disease status post CABG, hypertension, mild cognitive impairment who presented the ER with issues regarding his peritoneal dialysis/PD catheter not working. Nephrology initially thought that due to the position of PD catheter, him having constipation was likely the source. He was given bowel prep, had multiple bowel movements but still PD catheter did not work. IR consulted, temporary dialysis catheter was placed and patient was started on hemodialysis. Vascular surgery consulted, underwent diagnostic laparoscopy and revision of the peritoneal dialysis catheter on 06/29/2022. Per nephrology, plan is to continue intermittent hemodialysis for a month, improve his nutrition and then transition back to peritoneal dialysis in a month. ClIP process started.  Assessment and Plan: ESRD on PD/peritoneal dialysis catheter dysfunction (HCC) CT abdomen shows heavy burden of stool/constipation.  Nephrology consult, their initial opinion was that the position of the PD as well as his constipation was likely the reason for his PD not working, he was given bowel prep, had multiple bowel movements but PD catheter still did not work.  IR was consulted, patient underwent tunneled dialysis catheter on 06/26/2022 and started on intermittent hemodialysis.  Vascular surgery consulted, patient underwent revision of the PD on 06/29/2022.  Nephrology was initially planning to do IHD for a month and then back to PD and clip process was started however I was informed today that family did not agree to that so nephrology was planning to resume PD back and discharge tomorrow morning but  found out that patient's PD catheter was not working properly last night again.  Vascular surgery had already mentioned in the last note that they would not attempt redoing his PD catheter again.  Nephrology to discuss with the family and come up with a solution.  In the meantime, he will be on IHD.  Appreciate nephrology help. Requested vascular surgery for placing the patient on the schedule for PD catheter removal. Clip process completed.     Moderate aortic stenosis by prior echocardiogram - mean gradient 24.3 mmHg. peak gradient 42.9 mmHg. Aortic valve area, by VTI measures 1.15  cm. Intermittent hypotension but asymptomatic.   Hypokalemia resolved.  Per nephrology.   DM2 (diabetes mellitus, type 2) Currently on Tradjenta and Semglee 10 units in the morning but continue SSI.  Blood sugar fairly controlled.   Essential hypertension Stable continue current home medications.   Subjective: Seen at HD.  No nausea no vomiting.  Handling HD well.  No abdominal cramps.  Physical Exam: General: in Mild distress, No Rash Cardiovascular: S1 and S2 Present, aortic systolic  Murmur Respiratory: Good respiratory effort, Bilateral Air entry present. No Crackles, No wheezes Abdomen: Bowel Sound present, No tenderness Extremities: No edema Neuro: Alert and oriented x3, no new focal deficit  Data Reviewed: I have Reviewed nursing notes, Vitals, and Lab results. Since last encounter, pertinent lab results CBC and BMP   . I have ordered test including CBC and BMP  . I have discussed pt's care plan and test results with nephrology vascular surgery  .  Disposition: Status is: Inpatient Remains inpatient appropriate because: Awaiting stability and further workup  SCDs Start: 06/22/22 2100   Family Communication: No one at bedside Level of care: Med-Surg   Vitals:   07/03/22 1200  07/03/22 1210 07/03/22 1247 07/03/22 1642  BP: (!) 142/66 (!) 145/68 130/63 (!) 111/53  Pulse: 63 67 67 66  Resp:  (!) 23 (!) 22 18 18   Temp:  97.8 F (36.6 C) 98.2 F (36.8 C) 98.4 F (36.9 C)  TempSrc:      SpO2: 98% 94% 100% 100%  Weight:  70.7 kg    Height:         Author: Lynden Oxford, MD 07/03/2022 7:26 PM  Please look on [email or web address] to find out who is on call.

## 2022-07-03 NOTE — Progress Notes (Signed)
   07/03/22 1210  Vitals  Temp 97.8 F (36.6 C)  Pulse Rate 67  Resp (!) 22  BP (!) 145/68  SpO2 94 %  O2 Device Room Air  Weight 70.7 kg  Type of Weight Post-Dialysis  Oxygen Therapy  Patient Activity (if Appropriate) In bed  Post Treatment  Dialyzer Clearance Lightly streaked  Duration of HD Treatment -hour(s) 3.25 hour(s)  Hemodialysis Intake (mL) 0 mL  Liters Processed 78  Fluid Removed (mL) 2000 mL  Tolerated HD Treatment Yes   Received patient in bed to unit.  Alert and oriented.  Informed consent signed and in chart.   TX duration:3.25  Patient tolerated well.  Transported back to the room  Alert, without acute distress.  Hand-off given to patient's nurse.   Access used: [facility] Access issues: none  Total UF removed: 2L Medication(s) given: none    [facility]

## 2022-07-03 NOTE — Anesthesia Preprocedure Evaluation (Signed)
Anesthesia Evaluation  Patient identified by MRN, date of birth, ID band Patient awake    Reviewed: Allergy & Precautions, NPO status , Patient's Chart, lab work & pertinent test results  History of Anesthesia Complications Negative for: history of anesthetic complications  Airway Mallampati: III  TM Distance: >3 FB Neck ROM: Full    Dental no notable dental hx. (+) Dental Advisory Given   Pulmonary pneumonia, resolved, former smoker 20 pack year history, quit smoking 1974   Pulmonary exam normal        Cardiovascular hypertension, Pt. on medications and Pt. on home beta blockers + CAD, + CABG, + Peripheral Vascular Disease and +CHF  + Valvular Problems/Murmurs AS  Rhythm:Regular Rate:Normal  ACS 04/18/19 CABG 1980s Moderate AS 11/11/21  EKG 06/23/22 NSR, borderline repolarization abn  Echo 11/11/21 Aortic Valve: The aortic valve is tricuspid. There is moderate  calcification of the aortic valve. There is moderate thickening of the  aortic valve. Aortic valve regurgitation is mild. Aortic regurgitation PHT  measures 497 msec. Moderate aortic stenosis  is present. Aortic valve mean gradient measures 24.3 mmHg. Aortic valve  peak gradient measures 42.9 mmHg. Aortic valve area, by VTI measures 1.15  cm.  1. Left ventricular ejection fraction, by estimation, is 55 to 60%. Left  ventricular ejection fraction by 3D volume is 57 %. The left ventricle has  normal function. The left ventricle has no regional wall motion  abnormalities. Left ventricular diastolic   parameters are consistent with Grade I diastolic dysfunction (impaired  relaxation). The average left ventricular global longitudinal strain is  -22.7 %. The global longitudinal strain is normal.   2. Right ventricular systolic function is normal. The right ventricular  size is normal. There is normal pulmonary artery systolic pressure. The  estimated right ventricular  systolic pressure is 30.7 mmHg.   3. Left atrial size was mildly dilated.   4. The mitral valve is normal in structure. Mild mitral valve  regurgitation.   5. The aortic valve is tricuspid. There is moderate calcification of the  aortic valve. There is moderate thickening of the aortic valve. Aortic  valve regurgitation is mild. Moderate aortic valve stenosis.   6. The inferior vena cava is normal in size with greater than 50%  respiratory variability, suggesting right atrial pressure of 3 mmHg.   Cardiac Cath 06/21/17  Prox RCA to Mid RCA lesion is 30% stenosed.  Mid RCA lesion is 40% stenosed.  Dist RCA lesion is 30% stenosed.  Ost LAD to Prox LAD lesion is 100% stenosed.  LIMA graft was visualized by angiography and is normal in caliber.  Ost Cx to Prox Cx lesion is 90% stenosed.  Hemodynamic findings consistent with mild pulmonary hypertension.  LV end diastolic pressure is normal.   1. Severe double vessel CAD s/p 3V CABG with 3/3 patent bypass grafts  2. The LAD has 100% proximal occlusion. The entire LAD fills from the patent LIMA graft. The Diagonal branch fills from the vein graft 3. The Circumflex has 90% proximal stenosis. The vein graft fills the large obtuse marginal graft.  4. The RCA is a large dominant vessel with mild diffuse calcific plaque in the proximal, mid and distal vessel but no obstructive lesions.  5. The LIMA to the LAD is patent 6 The sequential saphenous vein graft to the Diagonal and OM is patent with mild filling defects seen in the proximal, mid and distal segments of the body of the graft. These lesions do not appear  to be flow limiting.  7. Mild aortic stenosis. Peak to peak gradient 4 mmHg. The valve was easily crossed.  8. Right and left heart filling pressures outline above.       Neuro/Psych Ataxia negative neurological ROS  negative psych ROS   GI/Hepatic negative GI ROS, Neg liver ROS,,,  Endo/Other  diabetes, Poorly Controlled,  Type 2, Insulin Dependent  Hyperlipidemia FS 89 on the floor, given dextrose IV for symptomatic hypoglycemia  Renal/GU Dialysis and ESRFRenal diseaseMalfunctioning PD catheter   Hx/o Prostate Ca S/P seed placement    Musculoskeletal  (+) Arthritis , Osteoarthritis,    Abdominal   Peds  Hematology  (+) Blood dyscrasia, anemia   Anesthesia Other Findings   Reproductive/Obstetrics                             Anesthesia Physical Anesthesia Plan  ASA: 4  Anesthesia Plan: General   Post-op Pain Management: Ofirmev IV (intra-op)*   Induction: Intravenous  PONV Risk Score and Plan: 4 or greater and Treatment may vary due to age or medical condition and Ondansetron  Airway Management Planned: Oral ETT  Additional Equipment: None  Intra-op Plan:   Post-operative Plan: Extubation in OR  Informed Consent: I have reviewed the patients History and Physical, chart, labs and discussed the procedure including the risks, benefits and alternatives for the proposed anesthesia with the patient or authorized representative who has indicated his/her understanding and acceptance.   Patient has DNR.   Dental advisory given  Plan Discussed with: Anesthesiologist and CRNA  Anesthesia Plan Comments: (Attempted calling wife to discuss DNR, will observe preferences from previous surgery.  No chest compression, drugs and intubation OK.)        Anesthesia Quick Evaluation

## 2022-07-03 NOTE — Progress Notes (Addendum)
Contacted [facility] admissions and clinic this morning to advise both that pt will need permanent HD placement at d/c. Will await final clinic acceptance.   [name] Renal Navigator [phone]  Addendum at 2:58 pm : Pt has been accepted at [facility] on TTS 11:00 chair time. Pt can start on Thursday and will need to arrive at 10:30 to complete paperwork prior to treatment. Spoke to pt's wife via phone to make her aware of above arrangements. Wife agreeable to plan. HD info placed on pt's AVS. Update provided to attending, nephrologist, renal PA, pt's RN, and RN CM. Will assist as needed.

## 2022-07-03 NOTE — Progress Notes (Signed)
[facility] Progress Note   Outpatient Dialysis Orders: [facility] PD CCPD 5 exchanges 2.5L dwell 1 hr 30 min dwell time No pause, no day dwell     Assessment/Plan: 1 Underdialysis/ PD catheter malfunction/ cognitive decline:  -Pt and wife's primary goal is to remain at home.  CT showed large stool burden and PD catheter in the anterior right lower quadrant adjacent to small bowel loops. Completed bowel regimen but multiple attempts to drain unsuccessful. Still having intermittent constipation but had multiple BMs and reports stomach feels better.  -TDC placed by IR for hopefully temporary HD while determining cause for PD catheter dysfunction. Had 2 hour HD Monday and tolerated well. HD today. Revision of PD catheter 06/29/2022. Per [facility] note, if PD catheter does not function post revision 06/29/2022, they would not recommend further efforts at PD .  - 3rd cycle did not completely drain 07/01/2022. Unfortunately even issues with manual flush of PD catheter evening of 5/5; will need to abandon PD and start HD today on MWF regimen. CLIP with renal navigator to be initiated today.   2 ESRD: on PD, see above in #1.  Patient had PD 06/30/2022 very low volume 6 exchanges 1.5%  500cc each. Tolerated well. 07/01/2022  increased volume to 1.5% 1L 6 exchanges but had issues with draining 3rd cycle. PD cath still not working well and not flushing at all. Plan to start HD today and CLIP as well.   3. Hypokalemia - 4K bath on HD for now. Regular diet as K+ and PO4 are both low.    4 Hypertension/ volume: Volume is stable. BP soft this AM. Hydralazine has been decreased to 25 mg PO BID.    5. Anemia of ESRD: Last Hgb 8.0. tsat 9%, receiving IV iron course (Ferrlecit 250 qod given on 5/3). Give Aranesp 40 mcg SQ 07/02/2022.    6. Metabolic Bone Disease: Corrected Ca 9.6  Phos low. Was on Auryxia which has been DC'd.  Does not really need a renal diet, changed to changed to regular diet.  7.Protein  Calorie Malnutrition: Very low albumin. Start on protein supplements here.    8.  Dispo: Patient and family very much opposed to IHD but has failed multiple attempts at PD.  Subjective: Unable to manually fluid PD catheter evening of 5/5. Ordered HD for today.   Objective Vitals:   07/02/22 0745 07/02/22 0927 07/02/22 1649 07/02/22 2123  BP:  109/60 121/62 129/69  Pulse:  72 60 70  Resp:  18 18 18   Temp:  98 F (36.7 C) 98 F (36.7 C) 98.7 F (37.1 C)  TempSrc:  Oral  Oral  SpO2:  98% 100% 100%  Weight: 75.1 kg     Height:       Physical Exam General: Pleasant, very elderly male in NAD Heart: S1,S2 3/6 systolic M. SR PACs on monitor.  Lungs: Coarse upper airway lungs sounds otherwise CTAB.  Abdomen: Soft, PD cath Drsg intact RUQ Extremities: No LE edema Dialysis Access: [facility] drsg intact  Additional Objective Labs: Basic Metabolic Panel: Recent Labs  Lab 07/01/22 0945 07/02/22 0039 07/03/22 0134  NA 134* 135 138  K 3.7 3.1* 3.5  CL 98 103 104  CO2 26 26 26   GLUCOSE 219* 236* 170*  BUN 15 18 22   CREATININE 3.08* 3.69* 4.35*  CALCIUM 7.9* 7.8* 7.9*  PHOS 2.4* 2.4* 2.6   Liver Function Tests: Recent Labs  Lab 07/01/22 0945 07/02/22 0039 07/03/22 0134  ALBUMIN 2.1* 1.7*  1.8*   No results for input(s): "LIPASE", "AMYLASE" in the last 168 hours. CBC: Recent Labs  Lab 06/29/22 1623 06/30/22 0826 07/01/22 0945 07/02/22 0039 07/03/22 0134  WBC 9.2 11.2* 11.0* 8.6 8.7  HGB 9.4* 8.3* 9.5* 8.0* 8.7*  HCT 31.2* 27.7* 32.1* 26.5* 28.7*  MCV 96.6 97.2 97.0 96.7 94.7  PLT 290 254 241 193 183   Blood Culture    Component Value Date/Time   SDES CATH TIP 06/29/2022 0902   SPECREQUEST NONE 06/29/2022 0902   CULT  06/29/2022 0902    NO GROWTH 3 DAYS Performed at [facility], [address] [place], [place] [address]    REPTSTATUS 07/02/2022 FINAL 06/29/2022 0902    Cardiac Enzymes: No results for input(s): "CKTOTAL", "CKMB", "CKMBINDEX", "TROPONINI"  in the last 168 hours. CBG: Recent Labs  Lab 07/01/22 1953 07/02/22 0741 07/02/22 1124 07/02/22 1643 07/02/22 2126  GLUCAP 194* 151* 247* 227* 185*   Iron Studies: No results for input(s): "IRON", "TIBC", "TRANSFERRIN", "FERRITIN" in the last 72 hours. @lablastinr3 @ Studies/Results: No results found. Medications:  dialysis solution 1.5% low-MG/low-CA      (feeding supplement) PROSource Plus  30 mL Oral BID BM   atorvastatin  80 mg Oral Daily   carvedilol  3.125 mg Oral BID WC   Chlorhexidine Gluconate Cloth  6 each Topical Q0600   Chlorhexidine Gluconate Cloth  6 each Topical Q0600   darbepoetin (ARANESP) injection - DIALYSIS  40 mcg Subcutaneous Q Sun-1800   gentamicin cream  1 Application Topical Daily   hydrALAZINE  25 mg Oral Q12H   insulin aspart  0-5 Units Subcutaneous QHS   insulin aspart  0-6 Units Subcutaneous TID WC   insulin glargine-yfgn  10 Units Subcutaneous Daily   isosorbide mononitrate  30 mg Oral Daily   lactulose  30 g Oral Daily   linagliptin  5 mg Oral Daily   mupirocin ointment  1 Application Nasal BID   pantoprazole  40 mg Oral Daily   polyethylene glycol  17 g Oral BID

## 2022-07-03 NOTE — Progress Notes (Signed)
I was called and informed the recent PD catheter is now nonfunctional.  Pt with new TDC, undergoing HD.   I discussed the above with his main physician Dr. Allena Katz as well as Benay Pike and his wife Olegario Messier. After discussing the risks and benefits of PD cathter removal to decrease future risk of infection, They elected to proceed.   NPO. Plan for removal tomorrow AM in OR  Victorino Sparrow MD

## 2022-07-03 NOTE — Hospital Course (Signed)
89 year old African-American male history of end-stage renal disease on peritoneal dialysis for last 3 years, moderate aortic stenosis by echocardiogram, type 2 diabetes, coronary disease status post CABG, hypertension, mild cognitive impairment who presented the ER with issues regarding his peritoneal dialysis/PD catheter not working. Nephrology initially thought that due to the position of PD catheter, him having constipation was likely the source. He was given bowel prep, had multiple bowel movements but still PD catheter did not work. IR consulted, temporary dialysis catheter was placed and patient was started on hemodialysis. Vascular surgery consulted, underwent diagnostic laparoscopy and revision of the peritoneal dialysis catheter on 06/29/2022. Per nephrology, plan is to continue intermittent hemodialysis for a month, improve his nutrition and then transition back to peritoneal dialysis in a month. ClIP process started.

## 2022-07-04 ENCOUNTER — Inpatient Hospital Stay (HOSPITAL_COMMUNITY): Payer: Medicare Other | Admitting: Anesthesiology

## 2022-07-04 ENCOUNTER — Encounter (HOSPITAL_COMMUNITY)
Admission: EM | Disposition: A | Discharge status: Home / Self Care | Payer: Self-pay | Source: Home / Self Care | Attending: Family Medicine

## 2022-07-04 DIAGNOSIS — Z87891 Personal history of nicotine dependence: Secondary | ICD-10-CM

## 2022-07-04 DIAGNOSIS — D638 Anemia in other chronic diseases classified elsewhere: Secondary | ICD-10-CM

## 2022-07-04 DIAGNOSIS — I132 Hypertensive heart and chronic kidney disease with heart failure and with stage 5 chronic kidney disease, or end stage renal disease: Secondary | ICD-10-CM

## 2022-07-04 DIAGNOSIS — I2510 Atherosclerotic heart disease of native coronary artery without angina pectoris: Secondary | ICD-10-CM

## 2022-07-04 DIAGNOSIS — T85691A Other mechanical complication of intraperitoneal dialysis catheter, initial encounter: Secondary | ICD-10-CM

## 2022-07-04 DIAGNOSIS — I509 Heart failure, unspecified: Secondary | ICD-10-CM

## 2022-07-04 DIAGNOSIS — N186 End stage renal disease: Secondary | ICD-10-CM

## 2022-07-04 DIAGNOSIS — Z992 Dependence on renal dialysis: Secondary | ICD-10-CM

## 2022-07-04 HISTORY — PX: REMOVAL OF A DIALYSIS CATHETER: SHX6053

## 2022-07-04 LAB — RENAL FUNCTION PANEL
Albumin: 1.7 g/dL — ABNORMAL LOW (ref 3.5–5.0)
Anion gap: 8 (ref 5–15)
BUN: 12 mg/dL (ref 8–23)
CO2: 27 mmol/L (ref 22–32)
Calcium: 7.7 mg/dL — ABNORMAL LOW (ref 8.9–10.3)
Chloride: 99 mmol/L (ref 98–111)
Creatinine, Ser: 3.12 mg/dL — ABNORMAL HIGH (ref 0.61–1.24)
GFR, Estimated: 19 mL/min — ABNORMAL LOW (ref >60)
Glucose, Bld: 125 mg/dL — ABNORMAL HIGH (ref 70–99)
Phosphorus: 2.6 mg/dL (ref 2.5–4.6)
Potassium: 2.9 mmol/L — ABNORMAL LOW (ref 3.5–5.1)
Sodium: 134 mmol/L — ABNORMAL LOW (ref 135–145)

## 2022-07-04 LAB — CBC
HCT: 26.6 % — ABNORMAL LOW (ref 39.0–52.0)
Hemoglobin: 8.3 g/dL — ABNORMAL LOW (ref 13.0–17.0)
MCH: 29.1 pg (ref 26.0–34.0)
MCHC: 31.2 g/dL (ref 30.0–36.0)
MCV: 93.3 fL (ref 80.0–100.0)
Platelets: 190 10*3/uL (ref 150–400)
RBC: 2.85 MIL/uL — ABNORMAL LOW (ref 4.22–5.81)
RDW: 17.5 % — ABNORMAL HIGH (ref 11.5–15.5)
WBC: 6.7 10*3/uL (ref 4.0–10.5)
nRBC: 0 % (ref 0.0–0.2)

## 2022-07-04 LAB — GLUCOSE, CAPILLARY
Glucose-Capillary: 114 mg/dL — ABNORMAL HIGH (ref 70–99)
Glucose-Capillary: 115 mg/dL — ABNORMAL HIGH (ref 70–99)
Glucose-Capillary: 120 mg/dL — ABNORMAL HIGH (ref 70–99)
Glucose-Capillary: 117 mg/dL — ABNORMAL HIGH (ref 70–99)

## 2022-07-04 SURGERY — REMOVAL, DIALYSIS CATHETER
Anesthesia: General | Site: Abdomen

## 2022-07-04 MED ORDER — DEXAMETHASONE SODIUM PHOSPHATE 10 MG/ML IJ SOLN
INTRAMUSCULAR | Status: DC | PRN
  Administered 2022-07-04: 5 mg via INTRAVENOUS

## 2022-07-04 MED ORDER — HYDRALAZINE HCL 25 MG PO TABS: 25.0000 mg | ORAL_TABLET | Freq: Two times a day (BID) | ORAL | 0 refills | Status: DC

## 2022-07-04 MED ORDER — ETOMIDATE 2 MG/ML IV SOLN
INTRAVENOUS | Status: DC | PRN
  Administered 2022-07-04: 18 mg via INTRAVENOUS

## 2022-07-04 MED ORDER — ONDANSETRON HCL 4 MG/2ML IJ SOLN
INTRAMUSCULAR | Status: AC
  Filled 2022-07-04: qty 2

## 2022-07-04 MED ORDER — CEFAZOLIN SODIUM-DEXTROSE 2-4 GM/100ML-% IV SOLN
INTRAVENOUS | Status: AC
  Filled 2022-07-04: qty 100

## 2022-07-04 MED ORDER — DEXAMETHASONE SODIUM PHOSPHATE 10 MG/ML IJ SOLN
INTRAMUSCULAR | Status: AC
  Filled 2022-07-04: qty 1

## 2022-07-04 MED ORDER — PANTOPRAZOLE SODIUM 40 MG PO TBEC: 40.0000 mg | DELAYED_RELEASE_TABLET | Freq: Every day | ORAL | 0 refills | Status: DC

## 2022-07-04 MED ORDER — FENTANYL CITRATE (PF) 250 MCG/5ML IJ SOLN
INTRAMUSCULAR | Status: DC | PRN
  Administered 2022-07-04: 100 ug via INTRAVENOUS
  Administered 2022-07-04: 25 ug via INTRAVENOUS

## 2022-07-04 MED ORDER — PROMETHAZINE HCL 25 MG/ML IJ SOLN: 6.2500 mg | INTRAMUSCULAR | Status: DC | PRN

## 2022-07-04 MED ORDER — LIDOCAINE 2% (20 MG/ML) 5 ML SYRINGE
INTRAMUSCULAR | Status: AC
  Filled 2022-07-04: qty 5

## 2022-07-04 MED ORDER — FUROSEMIDE 80 MG PO TABS: 80.0000 mg | ORAL_TABLET | ORAL | 3 refills | Status: DC

## 2022-07-04 MED ORDER — ONDANSETRON HCL 4 MG/2ML IJ SOLN
INTRAMUSCULAR | Status: DC | PRN
  Administered 2022-07-04: 4 mg via INTRAVENOUS

## 2022-07-04 MED ORDER — 0.9 % SODIUM CHLORIDE (POUR BTL) OPTIME
TOPICAL | Status: DC | PRN
  Administered 2022-07-04: 1000 mL

## 2022-07-04 MED ORDER — FENTANYL CITRATE (PF) 100 MCG/2ML IJ SOLN: 25.0000 ug | INTRAMUSCULAR | Status: DC | PRN

## 2022-07-04 MED ORDER — PROPOFOL 10 MG/ML IV BOLUS
INTRAVENOUS | Status: AC
  Filled 2022-07-04: qty 20

## 2022-07-04 MED ORDER — PHENYLEPHRINE HCL-NACL 20-0.9 MG/250ML-% IV SOLN
INTRAVENOUS | Status: DC | PRN
  Administered 2022-07-04: 20 ug/min via INTRAVENOUS

## 2022-07-04 MED ORDER — PHENYLEPHRINE 80 MCG/ML (10ML) SYRINGE FOR IV PUSH (FOR BLOOD PRESSURE SUPPORT)
PREFILLED_SYRINGE | INTRAVENOUS | Status: DC | PRN
  Administered 2022-07-04: 80 ug via INTRAVENOUS

## 2022-07-04 MED ORDER — ROCURONIUM BROMIDE 10 MG/ML (PF) SYRINGE
PREFILLED_SYRINGE | INTRAVENOUS | Status: AC
  Filled 2022-07-04: qty 10

## 2022-07-04 MED ORDER — CEFAZOLIN SODIUM-DEXTROSE 2-3 GM-%(50ML) IV SOLR
INTRAVENOUS | Status: DC | PRN
  Administered 2022-07-04: 2 g via INTRAVENOUS

## 2022-07-04 MED ORDER — POLYETHYLENE GLYCOL 3350 17 G PO PACK: 17.0000 g | PACK | Freq: Every day | ORAL | 0 refills | Status: DC | PRN

## 2022-07-04 MED ORDER — SODIUM CHLORIDE 0.9 % IV SOLN: INTRAVENOUS | Status: DC | PRN

## 2022-07-04 MED ORDER — PHENYLEPHRINE 80 MCG/ML (10ML) SYRINGE FOR IV PUSH (FOR BLOOD PRESSURE SUPPORT)
PREFILLED_SYRINGE | INTRAVENOUS | Status: AC
  Filled 2022-07-04: qty 10

## 2022-07-04 MED ORDER — POTASSIUM CHLORIDE CRYS ER 20 MEQ PO TBCR
40.0000 meq | EXTENDED_RELEASE_TABLET | Freq: Once | ORAL | Status: AC
  Administered 2022-07-04: 40 meq via ORAL
  Filled 2022-07-04: qty 2

## 2022-07-04 MED ORDER — ROCURONIUM BROMIDE 10 MG/ML (PF) SYRINGE
PREFILLED_SYRINGE | INTRAVENOUS | Status: DC | PRN
  Administered 2022-07-04: 60 mg via INTRAVENOUS

## 2022-07-04 MED ORDER — PROSOURCE PLUS PO LIQD: 30.0000 mL | Freq: Two times a day (BID) | ORAL | 0 refills | Status: DC

## 2022-07-04 MED ORDER — FENTANYL CITRATE (PF) 250 MCG/5ML IJ SOLN
INTRAMUSCULAR | Status: AC
  Filled 2022-07-04: qty 5

## 2022-07-04 MED ORDER — SUGAMMADEX SODIUM 200 MG/2ML IV SOLN
INTRAVENOUS | Status: DC | PRN
  Administered 2022-07-04: 282.8 mg via INTRAVENOUS

## 2022-07-04 MED ORDER — LIDOCAINE 2% (20 MG/ML) 5 ML SYRINGE
INTRAMUSCULAR | Status: DC | PRN
  Administered 2022-07-04: 100 mg via INTRAVENOUS

## 2022-07-04 SURGICAL SUPPLY — 50 items
BAG COUNTER SPONGE SURGICOUNT (BAG) ×1 IMPLANT
BNDG ELASTIC 4X5.8 VLCR STR LF (GAUZE/BANDAGES/DRESSINGS) IMPLANT
BNDG ELASTIC 6X5.8 VLCR STR LF (GAUZE/BANDAGES/DRESSINGS) IMPLANT
BNDG GAUZE DERMACEA FLUFF 4 (GAUZE/BANDAGES/DRESSINGS) IMPLANT
BRUSH SCRUB EZ PLAIN DRY (MISCELLANEOUS) IMPLANT
CANISTER SUCT 3000ML PPV (MISCELLANEOUS) ×1 IMPLANT
CLIP TI MEDIUM 6 (CLIP) ×1 IMPLANT
COVER SURGIBOOT TRANSDUCER 6 (DISPOSABLE) IMPLANT
COVER SURGICAL LIGHT HANDLE (MISCELLANEOUS) ×1 IMPLANT
DERMABOND ADVANCED .7 DNX12 (GAUZE/BANDAGES/DRESSINGS) IMPLANT
DRAPE CHEST BREAST 15X10 FENES (DRAPES) IMPLANT
DRAPE HALF SHEET 40X57 (DRAPES) IMPLANT
DRAPE U-SHAPE 76X120 STRL (DRAPES) IMPLANT
DRSG TEGADERM 4X4.75 (GAUZE/BANDAGES/DRESSINGS) IMPLANT
ELECT REM PT RETURN 9FT ADLT (ELECTROSURGICAL) ×1
ELECTRODE REM PT RTRN 9FT ADLT (ELECTROSURGICAL) ×1 IMPLANT
GAUZE SPONGE 2X2 STRL 8-PLY (GAUZE/BANDAGES/DRESSINGS) IMPLANT
GAUZE SPONGE 4X4 12PLY STRL (GAUZE/BANDAGES/DRESSINGS) ×1 IMPLANT
GAUZE XEROFORM 5X9 LF (GAUZE/BANDAGES/DRESSINGS) IMPLANT
GLOVE BIOGEL PI IND STRL 6.5 (GLOVE) IMPLANT
GLOVE BIOGEL PI IND STRL 8 (GLOVE) ×1 IMPLANT
GLOVE SURG SS PI 7.5 STRL IVOR (GLOVE) IMPLANT
GOWN STRL REUS W/ TWL LRG LVL3 (GOWN DISPOSABLE) ×2 IMPLANT
GOWN STRL REUS W/TWL 2XL LVL3 (GOWN DISPOSABLE) ×2 IMPLANT
GOWN STRL REUS W/TWL LRG LVL3 (GOWN DISPOSABLE) ×2
HANDPIECE INTERPULSE COAX TIP (DISPOSABLE)
IV NS IRRIG 3000ML ARTHROMATIC (IV SOLUTION) ×1 IMPLANT
KIT BASIN OR (CUSTOM PROCEDURE TRAY) ×1 IMPLANT
KIT TURNOVER KIT B (KITS) ×1 IMPLANT
NS IRRIG 1000ML POUR BTL (IV SOLUTION) ×1 IMPLANT
PACK CV ACCESS (CUSTOM PROCEDURE TRAY) IMPLANT
PACK GENERAL/GYN (CUSTOM PROCEDURE TRAY) IMPLANT
PACK UNIVERSAL I (CUSTOM PROCEDURE TRAY) ×1 IMPLANT
PAD ARMBOARD 7.5X6 YLW CONV (MISCELLANEOUS) ×2 IMPLANT
PULSAVAC PLUS IRRIG FAN TIP (DISPOSABLE)
SET HNDPC FAN SPRY TIP SCT (DISPOSABLE) IMPLANT
SOL PREP POV-IOD 4OZ 10% (MISCELLANEOUS) IMPLANT
SOL SCRUB PVP POV-IOD 4OZ 7.5% (MISCELLANEOUS) ×1
SOLUTION SCRB POV-IOD 4OZ 7.5% (MISCELLANEOUS) IMPLANT
SUT ETHILON 3 0 PS 1 (SUTURE) IMPLANT
SUT MNCRL AB 4-0 PS2 18 (SUTURE) IMPLANT
SUT VIC AB 2-0 CTX 36 (SUTURE) IMPLANT
SUT VIC AB 2-0 UR6 27 (SUTURE) IMPLANT
SUT VIC AB 3-0 SH 27 (SUTURE) ×3
SUT VIC AB 3-0 SH 27X BRD (SUTURE) IMPLANT
SUT VICRYL 4-0 PS2 18IN ABS (SUTURE) IMPLANT
TIP FAN IRRIG PULSAVAC PLUS (DISPOSABLE) IMPLANT
TOWEL GREEN STERILE (TOWEL DISPOSABLE) ×1 IMPLANT
TOWEL GREEN STERILE FF (TOWEL DISPOSABLE) IMPLANT
WATER STERILE IRR 1000ML POUR (IV SOLUTION) ×1 IMPLANT

## 2022-07-04 NOTE — Anesthesia Postprocedure Evaluation (Signed)
Anesthesia Post Note  [name]  Procedure(s) Performed: PERITONEAL CATHETER REMOVAL (Abdomen)     Patient location during evaluation: PACU Anesthesia Type: General Level of consciousness: sedated Pain management: pain level controlled Vital Signs Assessment: post-procedure vital signs reviewed and stable Respiratory status: spontaneous breathing and respiratory function stable Cardiovascular status: stable Postop Assessment: no apparent nausea or vomiting Anesthetic complications: no  No notable events documented.  Last Vitals:  Vitals:   07/04/22 0911 07/04/22 0931  BP: (!) 128/59 138/65  Pulse: 68 69  Resp: 18 18  Temp: 36.8 C 36.4 C  SpO2: 94% 94%    Last Pain:  Vitals:   07/04/22 0958  TempSrc:   PainSc: 0-No pain                 Caitlinn Klinker DANIEL

## 2022-07-04 NOTE — Anesthesia Procedure Notes (Signed)
Procedure Name: Intubation Date/Time: 07/04/2022 7:49 AM  Performed by: Zollie Beckers, CRNAPre-anesthesia Checklist: Patient identified, Emergency Drugs available, Suction available and Patient being monitored Patient Re-evaluated:Patient Re-evaluated prior to induction Oxygen Delivery Method: Circle system utilized Preoxygenation: Pre-oxygenation with 100% oxygen Induction Type: IV induction Ventilation: Mask ventilation without difficulty Laryngoscope Size: Mac and 4 Grade View: Grade II Tube type: Oral Tube size: 7.5 mm Number of attempts: 1 Airway Equipment and Method: Stylet Placement Confirmation: ETT inserted through vocal cords under direct vision, positive ETCO2 and breath sounds checked- equal and bilateral Secured at: 23 cm Tube secured with: Tape Dental Injury: Teeth and Oropharynx as per pre-operative assessment

## 2022-07-04 NOTE — Progress Notes (Signed)
  Daily Progress Note   Subjective: No complaints this morning  Objective: Vitals:   07/03/22 2131 07/04/22 0705  BP: (!) 117/58 129/61  Pulse: 76 62  Resp:  18  Temp: 98.3 F (36.8 C) 99.3 F (37.4 C)  SpO2: 100%     Physical Examination Regular rate Nonlabored breathing Soft abdomen  ASSESSMENT/PLAN:  Pt with nonfunctioning PD catheter that needs to be removed to lower risk of future infection.  After discussing the risk and benefits, [name] and his wife, [name] elected to proceed.    Fara Olden MD MS [facility] [phone] 07/04/2022  7:31 AM

## 2022-07-04 NOTE — Progress Notes (Signed)
DISCHARGE NOTE HOME Leory L Wittich to be discharged Home per MD order. Diagnosis, treatment, prescriptions and follow up appointments discussed with patients wife who verbalized an understanding Medication list explained in detail. Patient verbalized understanding. VSS. No complaints of pain. Skin clean, dry and intact without evidence of skin break down, no evidence of skin tears noted. IV catheter discontinued intact. Site without signs and symptoms of complications. Dressing and pressure applied. Pt denies pain at the site currently. No complaints noted. HD cath intact.  An After Visit Summary (AVS) was printed and given to the patient. Patient escorted via wheelchair, and discharged home via private auto.  Tresa Endo, RN

## 2022-07-04 NOTE — Progress Notes (Signed)
D/C order noted. Contacted renal PA to confirm that orders have been sent to clinic. Pt's out-pt HD arrangements added to AVS as well. Spoke with wife yesterday to go over details regarding clinic and schedule. Will contact clinic to make them aware of pt's d/c today and that pt will start on Thursday as planned. Will fax d/c summary to clinic once completed for continuation of care.   [name] Renal Navigator [phone]

## 2022-07-04 NOTE — Plan of Care (Signed)
[facility]  Initial Hemodialysis Orders  Dialysis center: [facility]  Patient's name: [name] DOB: [birth year] AKI or ESRD: ESRD  Discharge diagnosis: PD catheter malfunction 2. ESRD  Allergies:  Allergies  Allergen Reactions   Clonidine Hcl     Other reaction(s): constipation    Date of First Dialysis: First HD 06/26/22. Started PD 05/05/19 Cause of renal disease: T2DM  Dialysis Prescription: Dialysis Frequency: TIW Tx duration: 3.5 hours  BFR: 400 DFR: 800 EDW: 71kg  Dialyzer: 180NRe UF profile/Sodium modeling?: No Dialysis Bath:  3 K  2.25 Ca  Dialysis access: Access type: [facility] Date placed: 06/26/22 Surgeon: Dr. Marliss Coots Needle gauge: N/A  In Center Medications: Heparin Dose: None  Type: N/A VDRA: None Venofer: None, start PRN per protocol Mircera: IV q 2 weeks Next dose due: 07/11/22 Sensipar: None  Discharge labs: Hgb: 8.3 K+: 2.9  Ca: 7.7  Phos: 2.6 Alb: 1.7  Please draw routine labs. Additional labs needed: Please check weekly potassium level  Additional notes/follow-up: Will need referral to [facility] for permanent HD access   Rogers Blocker, PA-C 07/04/2022, 12:44 PM  [facility] Pager: [phone]

## 2022-07-04 NOTE — TOC Transition Note (Signed)
Transition of Care [facility]) - CM/SW Discharge Note   Patient Details  Name: [name] MRN: [id number] Date of Birth: 11-28-1933  [facility] CM/SW Contact:  Tom-Johnson, Hershal Coria, RN Phone Number: 07/04/2022, 12:53 PM   Clinical Narrative:     Patient is scheduled for discharge today.  Readmission Risk Assessment done. Outpatient f/u, hospital f/u and discharge instructions on AVS. No TOC needs or recommendations noted. Wife to transport at discharge.  No further TOC needs noted.       Final next level of care: Home/Self Care Barriers to Discharge: Barriers Resolved   Patient Goals and CMS Choice CMS Medicare.gov Compare Post Acute Care list provided to:: Patient Choice offered to / list presented to : NA  Discharge Placement                  Patient to be transferred to facility by: Wife Name of family member notified: [facility]    Discharge Plan and Services Additional resources added to the After Visit Summary for     Discharge Planning Services: CM Consult Post Acute Care Choice: NA          DME Arranged: N/A DME Agency: NA       HH Arranged: NA HH Agency: NA        Social Determinants of Health (SDOH) Interventions SDOH Screenings   Food Insecurity: No Food Insecurity (06/22/2022)  Housing: Low Risk  (06/22/2022)  Transportation Needs: No Transportation Needs (06/22/2022)  Utilities: Not At Risk (06/22/2022)  Depression (PHQ2-9): Low Risk  (07/13/2020)  Physical Activity: Sufficiently Active (10/31/2019)  Tobacco Use: Medium Risk (06/30/2022)     Readmission Risk Interventions    06/27/2022    3:51 PM  Readmission Risk Prevention Plan  Transportation Screening Complete  PCP or Specialist Appt within 5-7 Days Complete  Home Care Screening Complete  Medication Review (RN CM) Referral to Pharmacy

## 2022-07-04 NOTE — Op Note (Signed)
    NAME: [name]    MRN: [id number] DOB: 1933/11/26    DATE OF OPERATION: 07/04/2022  PREOP DIAGNOSIS:    Nonfunctioning peritoneal dialysis cathter   POSTOP DIAGNOSIS:    Same  PROCEDURE:    Peritoneal dialysis catheter removal   SURGEON: Victorino Sparrow  ASSIST: Mosetta Pigeon, PA  ANESTHESIA: General   EBL: 5ml  INDICATIONS:    [name] is a 89 y.o. male with ESRD and nonfunctioning peritoneal dialysis cathter. He is currently being dialyzed through a right-sided TDC. After discussing the risks and benefits of PD removal to prevent future infection, [name] elected to proceed.  FINDINGS:    Intact peritoneal dialysis catheter placement  TECHNIQUE:   Patient brought to the OR laid supine position.  General anesthesia was induced and patient was prepped draped standard fashion.  The case began with ultrasound insonation of the previously placed peritoneal dialysis catheter.  The felt rings were marked, as well as the entrance to the abdomen.  Four small transverse incisions were made on the abdomen.  The felt rings were released from soft tissue using cautery.  The peritoneal dialysis catheter was removed in its entirety and the fascia closed with Vicryl on a UR 6 needle.  Wound beds were irrigated with saline, and closed with a layer of Vicryl suture with Monocryl and Dermabond at the level of the skin.  Ladonna Snide, MD [facility] DATE OF DICTATION:   07/04/2022

## 2022-07-04 NOTE — Transfer of Care (Signed)
Immediate Anesthesia Transfer of Care Note  [name]  Procedure(s) Performed: PERITONEAL CATHETER REMOVAL (Abdomen)  Patient Location: PACU  Anesthesia Type:General  Level of Consciousness: awake  Airway & Oxygen Therapy: Patient Spontanous Breathing  Post-op Assessment: Report given to RN and Post -op Vital signs reviewed and stable  Post vital signs: Reviewed and stable  Last Vitals:  Vitals Value Taken Time  BP 115/62 07/04/22 0845  Temp    Pulse 68 07/04/22 0853  Resp 16 07/04/22 0853  SpO2 96 % 07/04/22 0853  Vitals shown include unvalidated device data.  Last Pain:  Vitals:   07/04/22 0705  TempSrc: Oral  PainSc:       Patients Stated Pain Goal: 0 (06/30/22 0735)  Complications: No notable events documented.

## 2022-07-04 NOTE — Progress Notes (Signed)
[facility] Progress Note   Subjective:   PD catheter removed this AM. K+ 2.9. Endorses poor appetite. Denies SOB, CP, palpitations, dizziness, nausea.   Objective Vitals:   07/04/22 0845 07/04/22 0900 07/04/22 0911 07/04/22 0931  BP: 115/62 120/61 (!) 128/59 138/65  Pulse: 70 68 68 69  Resp: 14 17 18 18   Temp: 98.3 F (36.8 C)  98.3 F (36.8 C) 97.6 F (36.4 C)  TempSrc:    Oral  SpO2: 94% 96% 94% 94%  Weight:      Height:       Physical Exam General: Alert male in NAD Heart: RRR, no 2/6 systolic murmur Lungs: CTA bilaterally Abdomen: Soft, non-distended, +BS Extremities: trace edema b/l lower extremities Dialysis Access: [facility]  Additional Objective Labs: Basic Metabolic Panel: Recent Labs  Lab 07/02/22 0039 07/03/22 0134 07/04/22 0400  NA 135 138 134*  K 3.1* 3.5 2.9*  CL 103 104 99  CO2 26 26 27   GLUCOSE 236* 170* 125*  BUN 18 22 12   CREATININE 3.69* 4.35* 3.12*  CALCIUM 7.8* 7.9* 7.7*  PHOS 2.4* 2.6 2.6   Liver Function Tests: Recent Labs  Lab 07/02/22 0039 07/03/22 0134 07/04/22 0400  ALBUMIN 1.7* 1.8* 1.7*   No results for input(s): "LIPASE", "AMYLASE" in the last 168 hours. CBC: Recent Labs  Lab 06/30/22 0826 07/01/22 0945 07/02/22 0039 07/03/22 0134 07/04/22 0400  WBC 11.2* 11.0* 8.6 8.7 6.7  HGB 8.3* 9.5* 8.0* 8.7* 8.3*  HCT 27.7* 32.1* 26.5* 28.7* 26.6*  MCV 97.2 97.0 96.7 94.7 93.3  PLT 254 241 193 183 190   Blood Culture    Component Value Date/Time   SDES CATH TIP 06/29/2022 0902   SPECREQUEST NONE 06/29/2022 0902   CULT  06/29/2022 0902    NO GROWTH 3 DAYS Performed at [facility], [address] [place], [place] [address]    REPTSTATUS 07/02/2022 FINAL 06/29/2022 0902    Cardiac Enzymes: No results for input(s): "CKTOTAL", "CKMB", "CKMBINDEX", "TROPONINI" in the last 168 hours. CBG: Recent Labs  Lab 07/03/22 1630 07/03/22 2132 07/04/22 0701 07/04/22 0847 07/04/22 0929  GLUCAP 160* 158*  114* 115* 120*   Iron Studies: No results for input(s): "IRON", "TIBC", "TRANSFERRIN", "FERRITIN" in the last 72 hours. @lablastinr3 @ Studies/Results: No results found. Medications:  ceFAZolin     dialysis solution 1.5% low-MG/low-CA      (feeding supplement) PROSource Plus  30 mL Oral BID BM   atorvastatin  80 mg Oral Daily   carvedilol  3.125 mg Oral BID WC   Chlorhexidine Gluconate Cloth  6 each Topical Q0600   Chlorhexidine Gluconate Cloth  6 each Topical Q0600   darbepoetin (ARANESP) injection - DIALYSIS  40 mcg Subcutaneous Q Sun-1800   gentamicin cream  1 Application Topical Daily   hydrALAZINE  25 mg Oral Q12H   insulin aspart  0-5 Units Subcutaneous QHS   insulin aspart  0-6 Units Subcutaneous TID WC   insulin glargine-yfgn  10 Units Subcutaneous Daily   isosorbide mononitrate  30 mg Oral Daily   lactulose  30 g Oral Daily   linagliptin  5 mg Oral Daily   pantoprazole  40 mg Oral Daily   polyethylene glycol  17 g Oral BID    Dialysis Orders:  Assessment/Plan: 1 Underdialysis/ PD catheter malfunction/ cognitive decline:  -Pt and wife's primary goal is to remain at home.  CT showed large stool burden and PD catheter in the anterior right lower quadrant adjacent to small  bowel loops. Completed bowel regimen but multiple attempts to drain unsuccessful. Still having intermittent constipation but had multiple BMs and reports stomach feels better.  -TDC placed by IR for hopefully temporary HD while determining cause for PD catheter dysfunction. Had 2 hour HD Monday and tolerated well. HD today. Revision of PD catheter 06/29/2022. Per [facility] note, if PD catheter does not function post revision 06/29/2022, they would not recommend further efforts at PD .  - 3rd cycle did not completely drain 07/01/2022. Unfortunately even issues with manual flush of PD catheter evening of 5/5; will need to abandon PD and start HD. He has been accepted at [facility] starting Thursday. Will be on TTS  schedule. PD catheter removed. Next HD will be Thursday   2 ESRD: on PD, see above in #1.  Patient had PD 06/30/2022 very low volume 6 exchanges 1.5%  500cc each. Tolerated well. 07/01/2022  increased volume to 1.5% 1L 6 exchanges but had issues with draining 3rd cycle. Unfortunately PD cath stopped working, removed this AM. Now converted to HD.   3. Hypokalemia - 4K bath on HD for now. Regular diet as K+ and PO4 are both low. K+ 2.9 today, ordered PO Kcl.    4 Hypertension/ volume: Volume is improved. BP controlled. Hydralazine has been decreased to 25 mg PO BID.    5. Anemia of ESRD: Last Hgb 8.3. tsat 9%, receiving IV iron course (Ferrlecit 250 qod given on 5/3). Give Aranesp 40 mcg SQ 07/02/2022.    6. Metabolic Bone Disease: Corrected Ca ok, Phos running low. Was on Auryxia which has been DC'd.  Does not really need a renal diet, changed to changed to regular diet.   7.Protein Calorie Malnutrition: Very low albumin. Start on protein supplements here.    8.  Dispo: Patient and family very much opposed to IHD but has failed multiple attempts at PD.  Rogers Blocker, PA-C 07/04/2022, 10:23 AM  [facility] Pager: [phone]

## 2022-07-04 NOTE — Progress Notes (Signed)
Pt was picked up for surgery. Contacted Gillis Ends ([facility]) to provide report.

## 2022-07-05 ENCOUNTER — Encounter (HOSPITAL_COMMUNITY): Payer: Self-pay | Admitting: Vascular Surgery

## 2022-07-05 ENCOUNTER — Telehealth: Payer: Self-pay | Admitting: Physician Assistant

## 2022-07-05 NOTE — Progress Notes (Signed)
Late Note Entry  D/C summary and last renal note faxed to clinic for continuation of care.   Lemoyne Nestor Renal Navigator [phone] 

## 2022-07-05 NOTE — Discharge Summary (Signed)
Physician Discharge Summary   [name] MRN: [id number] DOB: Dec 26, 1933  Admit date:     06/22/2022  Discharge date: 07/04/2022  Discharge Physician: Lynden Oxford  PCP: Linus Galas, NP  Recommendations at discharge:  Follow up with PCP in 1 week   Follow-up Information     Center, [facility]. Go on 07/06/2022.   Why: Schedule is Tuesday/Thursday/Saturday with 11:00 am chair time.  On Thursday, please arrive at 10:30 am to complete paperwork prior to treatment. Contact information: [address] [place] [address] [phone]         Linus Galas, NP. Schedule an appointment as soon as possible for a visit in 1 week(s).   Contact information: [address] [address] [place] [address] [phone]                Discharge Diagnoses: Principal Problem:   Peritoneal dialysis catheter dysfunction [facility]) Active Problems:   Moderate aortic stenosis by prior echocardiogram - mean gradient 24.3 mmHg. peak gradient 42.9 mmHg. Aortic valve area, by VTI measures 1.15  cm.   Essential hypertension   DM2 (diabetes mellitus, type 2) (HCC)   Hypokalemia   ESRD on peritoneal dialysis [facility])   DNR (do not resuscitate)  Hospital Course: 89 year old African-American male history of end-stage renal disease on peritoneal dialysis for last 3 years, moderate aortic stenosis by echocardiogram, type 2 diabetes, coronary disease status post CABG, hypertension, mild cognitive impairment who presented the ER with issues regarding his peritoneal dialysis/PD catheter not working. Nephrology initially thought that due to the position of PD catheter, him having constipation was likely the source. He was given bowel prep, had multiple bowel movements but still PD catheter did not work. IR consulted, temporary dialysis catheter was placed and patient was started on hemodialysis. Vascular surgery consulted, underwent diagnostic laparoscopy and revision of the peritoneal  dialysis catheter on 06/29/2022. Per nephrology, plan is to continue intermittent hemodialysis for a month, improve his nutrition and then transition back to peritoneal dialysis in a month. ClIP process started.  Assessment and Plan  ESRD on PD/peritoneal dialysis catheter dysfunction (HCC) CT abdomen shows heavy burden of stool/constipation.  Nephrology consult, their initial opinion was that the position of the PD as well as his constipation was likely the reason for his PD not working, he was given bowel prep, had multiple bowel movements but PD catheter still did not work.  IR was consulted, patient underwent tunneled dialysis catheter on 06/26/2022 and started on intermittent hemodialysis.  Vascular surgery consulted, patient underwent revision of the PD on 06/29/2022.  Nephrology was initially planning to do IHD for a month and then back to PD and clip process was started however I was informed today that family did not agree to that so nephrology was planning to resume PD back and discharge tomorrow morning but found out that patient's PD catheter was not working properly last night again.  Vascular surgery had already mentioned in the last note that they would not attempt redoing his PD catheter again.  Nephrology to discuss with the family and come up with a solution.  In the meantime, he will be on IHD.  Appreciate nephrology help. Requested vascular surgery for placing the patient on the schedule for PD catheter removal. Underwent the procedure on 5/7. Clip process completed.     Moderate aortic stenosis by prior echocardiogram - mean gradient 24.3 mmHg. peak gradient 42.9 mmHg. Aortic valve area, by VTI measures 1.15  cm. Intermittent hypotension but  asymptomatic. Outpt follow up recommended    Hypokalemia resolved.  Per nephrology.   DM2 (diabetes mellitus, type 2) Currently on Tradjenta and Semglee 10 units in the morning but continue SSI.  Blood sugar fairly controlled.   Essential  hypertension Stable continue current home medications.   Consultants:  Nephrology   Procedures performed:  Vascular surgery   DISCHARGE MEDICATION: Allergies as of 07/04/2022       Reactions   Clonidine Hcl    Other reaction(s): constipation        Medication List     TAKE these medications    (feeding supplement) PROSource Plus liquid Take 30 mLs by mouth 2 (two) times daily between meals.   aspirin EC 81 MG tablet Take 81 mg by mouth daily.   atorvastatin 80 MG tablet Commonly known as: LIPITOR Take 1 tablet (80 mg total) by mouth daily.   Azelastine HCl 0.15 % Soln Place 1 spray into both nostrils daily as needed (allergies).   B Complex-Vitamin C Caps Take by mouth.   Carboxymethylcellulose Sod PF 0.5 % Soln 1 drop into affected eye as needed   carvedilol 3.125 MG tablet Commonly known as: COREG TAKE 1 TABLET BY MOUTH TWICE A DAY WITH FOOD   furosemide 80 MG tablet Commonly known as: LASIX Take 1 tablet (80 mg total) by mouth every Monday, Wednesday, and Friday. What changed: when to take this   gentamicin cream 0.1 % Commonly known as: GARAMYCIN Apply topically daily.   hydrALAZINE 25 MG tablet Commonly known as: APRESOLINE Take 1 tablet (25 mg total) by mouth every 12 (twelve) hours. What changed:  medication strength how much to take when to take this additional instructions   insulin glargine 100 UNIT/ML injection Commonly known as: LANTUS Inject 13 Units into the skin at bedtime.   nitroGLYCERIN 0.4 MG SL tablet Commonly known as: NITROSTAT Place 1 tablet (0.4 mg total) under the tongue every 5 (five) minutes x 3 doses as needed for chest pain.   OVER THE COUNTER MEDICATION Place 1 spray into the nose daily as needed. Sovereign Silver Nasal Spray for Immune Support   pantoprazole 40 MG tablet Commonly known as: PROTONIX Take 1 tablet (40 mg total) by mouth daily.   polyethylene glycol 17 g packet Commonly known as: MIRALAX /  GLYCOLAX Take 17 g by mouth daily as needed for mild constipation.   sevelamer carbonate 800 MG tablet Commonly known as: RENVELA Take 800 mg by mouth 5 (five) times daily.   Systane Complete 0.6 % Soln Generic drug: Propylene Glycol Place 1 drop into both eyes daily as needed (Dry eye).   Tradjenta 5 MG Tabs tablet Generic drug: linagliptin Take 5 mg by mouth every morning.   traZODone 50 MG tablet Commonly known as: DESYREL Take 50 mg by mouth at bedtime as needed for sleep.       Disposition: Home Diet recommendation: Renal diet  Discharge Exam: Vitals:   07/04/22 0845 07/04/22 0900 07/04/22 0911 07/04/22 0931  BP: 115/62 120/61 (!) 128/59 138/65  Pulse: 70 68 68 69  Resp: 14 17 18 18   Temp: 98.3 F (36.8 C)  98.3 F (36.8 C) 97.6 F (36.4 C)  TempSrc:    Oral  SpO2: 94% 96% 94% 94%  Weight:      Height:       General: Appear in mild distress; no visible Abnormal Neck Mass Or lumps, Conjunctiva normal Cardiovascular: S1 and S2 Present, aortic systolic  Murmur, Respiratory: good  respiratory effort, Bilateral Air entry present and CTA, no Crackles, no wheezes Abdomen: Bowel Sound present, Non tender  Extremities: no Pedal edema Neurology: alert and oriented to time, place, and person  [facility]   07/03/22 0838 07/03/22 1210 07/04/22 0705  Weight: 73.4 kg 70.7 kg 70.7 kg   Condition at discharge: stable  The results of significant diagnostics from this hospitalization (including imaging, microbiology, ancillary and laboratory) are listed below for reference.   Imaging Studies: IR Fluoro Guide CV Line Right  Result Date: 06/26/2022 INDICATION: 89 year old male history of end-stage renal disease requiring tunneled hemodialysis catheter placement due to peritoneal dialysis catheter malfunction. EXAM: TUNNELED CENTRAL VENOUS HEMODIALYSIS CATHETER PLACEMENT WITH ULTRASOUND AND FLUOROSCOPIC GUIDANCE MEDICATIONS: Ancef 2 gm IV . The antibiotic was given in an  appropriate time interval prior to skin puncture. ANESTHESIA/SEDATION: Moderate (conscious) sedation was employed during this procedure. A total of Versed 0.5 mg and Fentanyl 25 mcg was administered intravenously. Moderate Sedation Time: 10 minutes. The patient's level of consciousness and vital signs were monitored continuously by radiology nursing throughout the procedure under my direct supervision. FLUOROSCOPY TIME:  One mGy COMPLICATIONS: None immediate. PROCEDURE: Informed written consent was obtained from the patient after a discussion of the risks, benefits, and alternatives to treatment. Questions regarding the procedure were encouraged and answered. The right neck and chest were prepped with chlorhexidine in a sterile fashion, and a sterile drape was applied covering the operative field. Maximum barrier sterile technique with sterile gowns and gloves were used for the procedure. A timeout was performed prior to the initiation of the procedure. After creating a small venotomy incision, a 21 gauge micropuncture kit was utilized to access the internal jugular vein. Real-time ultrasound guidance was utilized for vascular access including the acquisition of a permanent ultrasound image documenting patency of the accessed vessel. A Rosen wire was advanced to the level of the IVC and the micropuncture sheath was exchanged for an 8 Fr dilator. A 14.5 French tunneled hemodialysis catheter measuring 19 cm from tip to cuff was tunneled in a retrograde fashion from the anterior chest wall to the venotomy incision. Serial dilation was then performed an a peel-away sheath was placed. The catheter was then placed through the peel-away sheath with the catheter tip ultimately positioned within the right atrium. Final catheter positioning was confirmed and documented with a spot radiographic image. The catheter aspirates and flushes normally. The catheter was flushed with appropriate volume heparin dwells. The catheter exit  site was secured with a 0-Silk retention suture. The venotomy incision was closed with Dermabond. Sterile dressings were applied. The patient tolerated the procedure well without immediate post procedural complication. IMPRESSION: Successful placement of 19 cm tip to cuff tunneled hemodialysis catheter via the right internal jugular vein with catheter tip terminating within the right atrium. The catheter is ready for immediate use. Marliss Coots, MD [facility] Electronically Signed   By: Marliss Coots M.D.   On: 06/26/2022 09:25   IR US Guide Vasc Access Right  Result Date: 06/26/2022 INDICATION: 89 year old male history of end-stage renal disease requiring tunneled hemodialysis catheter placement due to peritoneal dialysis catheter malfunction. EXAM: TUNNELED CENTRAL VENOUS HEMODIALYSIS CATHETER PLACEMENT WITH ULTRASOUND AND FLUOROSCOPIC GUIDANCE MEDICATIONS: Ancef 2 gm IV . The antibiotic was given in an appropriate time interval prior to skin puncture. ANESTHESIA/SEDATION: Moderate (conscious) sedation was employed during this procedure. A total of Versed 0.5 mg and Fentanyl 25 mcg was administered intravenously. Moderate Sedation Time:  10 minutes. The patient's level of consciousness and vital signs were monitored continuously by radiology nursing throughout the procedure under my direct supervision. FLUOROSCOPY TIME:  One mGy COMPLICATIONS: None immediate. PROCEDURE: Informed written consent was obtained from the patient after a discussion of the risks, benefits, and alternatives to treatment. Questions regarding the procedure were encouraged and answered. The right neck and chest were prepped with chlorhexidine in a sterile fashion, and a sterile drape was applied covering the operative field. Maximum barrier sterile technique with sterile gowns and gloves were used for the procedure. A timeout was performed prior to the initiation of the  procedure. After creating a small venotomy incision, a 21 gauge micropuncture kit was utilized to access the internal jugular vein. Real-time ultrasound guidance was utilized for vascular access including the acquisition of a permanent ultrasound image documenting patency of the accessed vessel. A Rosen wire was advanced to the level of the IVC and the micropuncture sheath was exchanged for an 8 Fr dilator. A 14.5 French tunneled hemodialysis catheter measuring 19 cm from tip to cuff was tunneled in a retrograde fashion from the anterior chest wall to the venotomy incision. Serial dilation was then performed an a peel-away sheath was placed. The catheter was then placed through the peel-away sheath with the catheter tip ultimately positioned within the right atrium. Final catheter positioning was confirmed and documented with a spot radiographic image. The catheter aspirates and flushes normally. The catheter was flushed with appropriate volume heparin dwells. The catheter exit site was secured with a 0-Silk retention suture. The venotomy incision was closed with Dermabond. Sterile dressings were applied. The patient tolerated the procedure well without immediate post procedural complication. IMPRESSION: Successful placement of 19 cm tip to cuff tunneled hemodialysis catheter via the right internal jugular vein with catheter tip terminating within the right atrium. The catheter is ready for immediate use. Marliss Coots, MD [facility]iology Electronically Signed   By: Marliss Coots M.D.   On: 06/26/2022 09:25   DG Abd Portable 1V  Result Date: 06/24/2022 CLINICAL DATA:  Problem with dialysis catheter. EXAM: PORTABLE ABDOMEN - 1 VIEW COMPARISON:  None Available. FINDINGS: The bowel gas pattern is normal. Dialysis catheter projects in the right abdomen, in similar position to April 25th 2024. IMPRESSION: Dialysis catheter projects in the right abdomen, in similar  position to April 25th 2024. Electronically Signed   By: Ted Mcalpine M.D.   On: 06/24/2022 16:22   CT ABDOMEN PELVIS WO CONTRAST  Result Date: 06/23/2022 CLINICAL DATA:  Peritoneal dialysis catheter dysfunction EXAM: CT ABDOMEN AND PELVIS WITHOUT CONTRAST TECHNIQUE: Multidetector CT imaging of the abdomen and pelvis was performed following the standard protocol without IV contrast. RADIATION DOSE REDUCTION: This exam was performed according to the departmental dose-optimization program which includes automated exposure control, adjustment of the mA and/or kV according to patient size and/or use of iterative reconstruction technique. COMPARISON:  None Available. FINDINGS: Lower chest: Small right pleural effusion. Right base atelectasis. Coronary artery and aortic calcifications. Hepatobiliary: No focal hepatic abnormality. Gallbladder unremarkable. Pancreas: No focal abnormality or ductal dilatation. Spleen: No focal abnormality.  Normal size. Adrenals/Urinary Tract: No adrenal abnormality. No focal renal abnormality. No stones or hydronephrosis. Urinary bladder is unremarkable. Stomach/Bowel: Large stool burden within the colon. Stomach and small bowel decompressed. No bowel obstruction. Vascular/Lymphatic: Aortic atherosclerosis. No evidence of aneurysm or adenopathy. Reproductive: Radiation seeds in the region of the prostate. Other: Moderate free fluid throughout the abdomen and pelvis.  Scattered locules of free air predominantly in the upper abdomen. This is likely related to peritoneal dialysis. At peritoneal dialysis catheter is located in the anterior right lower quadrant adjacent to small bowel loops. Musculoskeletal: No acute bony abnormality. IMPRESSION: Peritoneal dialysis catheter in the anterior right lower quadrant adjacent to small bowel loops. Moderate free fluid and scattered free air, likely related to peritoneal dialysis. Small right pleural effusion.  Right base atelectasis. Large  stool burden. Aortic atherosclerosis. Electronically Signed   By: Charlett Nose M.D.   On: 06/23/2022 01:01   DG Abd Acute W/Chest  Result Date: 06/22/2022 CLINICAL DATA:  Abdominal pain EXAM: DG ABDOMEN ACUTE WITH 1 VIEW CHEST COMPARISON:  04/18/2019 FINDINGS: Prior CABG. Cardiomegaly, vascular congestion. Interstitial prominence and patchy right mid and lower lung airspace disease. No visible significant effusion. Nonobstructive bowel gas pattern. No organomegaly or free air. Aortoiliac atherosclerosis. Radiation seeds in the region of the prostate. IMPRESSION: Cardiomegaly with vascular congestion. Interstitial prominence bilaterally with patchy right mid and lower lung airspace opacities. Findings are similar to prior study. This could reflect chronic lung disease although this could reflect recurrent CHF and/or pneumonia. No acute findings in the abdomen. Electronically Signed   By: Charlett Nose M.D.   On: 06/22/2022 18:28    Microbiology: Results for orders placed or performed during the hospital encounter of 06/22/22  Surgical pcr screen     Status: Abnormal   Collection Time: 06/29/22 12:12 AM   Specimen: Nasal Mucosa; Nasal Swab  Result Value Ref Range Status   MRSA, PCR NEGATIVE NEGATIVE Final   Staphylococcus aureus POSITIVE (A) NEGATIVE Final    Comment: (NOTE) The Xpert SA Assay (FDA approved for NASAL specimens in patients 46 years of age and older), is one component of a comprehensive surveillance program. It is not intended to diagnose infection nor to guide or monitor treatment. Performed at [facility], [address], [place], [place] [address]   Cath Tip Culture     Status: None   Collection Time: 06/29/22  9:02 AM   Specimen: Catheter Tip  Result Value Ref Range Status   Specimen Description CATH TIP  Final   Special Requests NONE  Final   Culture   Final    NO GROWTH 3 DAYS Performed at [facility], [address], [place], [place] [address]     Report Status 07/02/2022 FINAL  Final   Labs: CBC: Recent Labs  Lab 06/30/22 0826 07/01/22 0945 07/02/22 0039 07/03/22 0134 07/04/22 0400  WBC 11.2* 11.0* 8.6 8.7 6.7  HGB 8.3* 9.5* 8.0* 8.7* 8.3*  HCT 27.7* 32.1* 26.5* 28.7* 26.6*  MCV 97.2 97.0 96.7 94.7 93.3  PLT 254 241 193 183 190   Basic Metabolic Panel: Recent Labs  Lab 06/30/22 1452 07/01/22 0945 07/02/22 0039 07/03/22 0134 07/04/22 0400  NA 136 134* 135 138 134*  K 3.3* 3.7 3.1* 3.5 2.9*  CL 99 98 103 104 99  CO2 28 26 26 26 27   GLUCOSE 100* 219* 236* 170* 125*  BUN 9 15 18 22 12   CREATININE 1.97* 3.08* 3.69* 4.35* 3.12*  CALCIUM 7.4* 7.9* 7.8* 7.9* 7.7*  PHOS 1.8* 2.4* 2.4* 2.6 2.6   Liver Function Tests: Recent Labs  Lab 06/30/22 1452 07/01/22 0945 07/02/22 0039 07/03/22 0134 07/04/22 0400  ALBUMIN 2.0* 2.1* 1.7* 1.8* 1.7*   CBG: Recent Labs  Lab 07/03/22 2132 07/04/22 0701 07/04/22 0847 07/04/22 0929 07/04/22 1118  GLUCAP 158* 114* 115* 120* 117*  Discharge time spent: greater than 30 minutes.  Signed: Lynden Oxford, MD [facility] 07/04/2022

## 2022-07-05 NOTE — Telephone Encounter (Signed)
Transition of care contact from inpatient facility  Date of Discharge: 07/04/22 Date of Contact: 07/05/22 Method of contact: Phone  Attempted to contact patient to discuss transition of care from inpatient admission. Patient did not answer the phone. Message was left on the patient's voicemail with call back directions.  Rogers Blocker, PA-C 07/05/2022, 2:15 PM  [facility] Pager: [phone]

## 2022-09-14 ENCOUNTER — Other Ambulatory Visit: Payer: Self-pay | Admitting: Internal Medicine

## 2022-09-29 ENCOUNTER — Encounter: Payer: Self-pay | Admitting: Internal Medicine

## 2022-09-29 ENCOUNTER — Ambulatory Visit: Payer: Medicare Other | Admitting: Internal Medicine

## 2022-09-29 VITALS — BP 120/58 | HR 63 | Ht 71.0 in | Wt 147.2 lb

## 2022-09-29 DIAGNOSIS — R0600 Dyspnea, unspecified: Secondary | ICD-10-CM | POA: Diagnosis not present

## 2022-09-29 DIAGNOSIS — I350 Nonrheumatic aortic (valve) stenosis: Secondary | ICD-10-CM | POA: Diagnosis not present

## 2022-09-29 DIAGNOSIS — I5030 Unspecified diastolic (congestive) heart failure: Secondary | ICD-10-CM | POA: Diagnosis not present

## 2022-09-29 NOTE — Progress Notes (Unsigned)
OFFICE NOTE   Chief Complaint:  Follow-up   Primary Care Physician: Linus Galas, NP  HPI:  [name] is a 89 year old with a history of coronary artery disease status post CABG in 1987, hypertension, dyslipidemia, diabetes, and peripheral arterial disease which is mild. Over the past year he has done well from a cardiac standpoint. He does have a history of prostate cancer and underwent radiation seed implant for that. He has also been having problems with confusion and brain fog and his medications were adjusted, including holding his statin, which did not cause much improvement. This may be some early dementia. In addition, he has abnormal lower extremity Dopplers, which I repeated. This has actually shown mild improvement in his ABIs with ABI 1.1 on the right and ABI of 0.92 on the left. The bilateral posterior tibials were occluded and there was 50% to 69% reduction in left common iliac and about a 0 to 49% reduction in the right superficial femoral. Despite this, he has no symptoms of claudication. He also denies any chest pain. He has had some shortness of breath and this, however, seems to be related to this episode over the past several months where he reported initially he was thought stung by a bee, developed swelling on the left side of his face and then subsequently developed extreme weakness and there as concern of a rheumatoid arthritis. His symptoms have improved with a steroid burst and tapered. He is currently on prednisone as well as methotrexate. He has also had recent dizziness. He reports head and sinus congestion, which may be contributing to his symptoms. However some of the dizziness is associated with walking up a hill at the end of exercise or walking down hills particularly. Is not associated with change in position or quick head movements.  There is some associated fatigue and chest fullness with exercise and improves with rest, reminiscent of possible  angina.  He underwent bilateral carotid Dopplers which showed a mild amount of plaque. A nuclear stress test performed which demonstrated no reversible ischemia and preserved ejection fraction. He still reports he has some mild dizziness and does fatigue a little at the beginning of exercise but improves when he continues to exercise.  He is generally without complaints today. He reports that recently blood work indicated that his kidney function was worsening and that he is now scheduled to see a nephrologist next Monday. I do not yet have those results in front of me. His metformin was discontinued due to this but he remains on lisinopril and that may need to be adjusted. He denies any specific anginal symptoms. He has some very mild claudication symptoms but it improves with walking. He continues to have a systolic murmur best heard over the aortic area and does have a history of aortic sclerosis in the past.  [name] returns today for follow-up. Unfortunately says he hasn't taken his medicine today. He is supposed to take hydralazine 100 mg 3 times a day and for some reason has not taken 2 doses this morning. Blood pressure was very high at 196/86 but he denied any chest pain or headache. He says he going to take his medicine when he gets home. Is also on Flomax but no other blood pressure medicines. This is probably due to worsening renal function. Currently his creatinine is around 3 and is followed by Dr. Briant Cedar. Is also on Lipitor and cholesterol was recently checked by Dr. Juleen China.  10/07/2015  [name] returns today for follow-up. Over the past year he's had no new complaints although his blood pressures been difficult to control. He's followed by Dr. Briant Cedar. He was previously on amlodipine but that's been discontinued. He is now on hydralazine, clonidine and valsartan. He did not take his medicines today and his blood pressure is elevated. He says he takes the clonidine once or twice  per day. He tries to void in the morning because it causes extreme fatigue. We had previously discussed the Catapres patch however cost was limiting. He also reports recently some worsening memory.  10/16/2016  [name] returns for follow-up. Many years since I last saw him. He's called in for some chest pain which she said got better. His last stress test was in 2016 which was negative for ischemia. He was noted to have some aortic sclerosis in 2015 however last year was noted to have a louder systolic murmur which sounds more like aortic stenosis. Blood pressures not been well controlled. Today's elevated again 160/70. He says his blood pressure medicines make him feel tired. He told me that he takes all for his blood pressure medicines at night. When asked further about his hydralazine which is supposed to be taken 3 times a day he says he does not do that. He then reported that his blood pressure typically runs higher in the morning and throughout the day. He currently denies any chest pain. He reports some left lower strandy swelling but also has numbness and tingling has been undergoing and back injections.  10/27/2016  [name] had adjustments on his medications. He is now taking a hydralazine 3 times a day, irbesartan in the morning and clonidine at night. I reviewed his blood pressures which tend to range in the 140s in the morning before taking medication, in the 120s in the afternoon after medication and typically in the 120s over 80s at night. This indicates adequate treatment of his blood pressure. He did undergo an echocardiogram for aortic stenosis. This demonstrated a normal LVEF of 60-65% with mild aortic stenosis and a mean gradient of 11 mmHg. He understands the implications of this and that we will continue to follow his aortic valve disease.  02/28/2017  [name] presents today with significant shortness of breath, weight gain and leg edema as well as increasing abdominal  girth over the past 2-3 weeks.  The symptoms came on gradually but clinically worsened.  Weight in August was 185 pounds, he now weighs 204 pounds.  Reports worsening shortness of breath, orthopnea and PND.  Blood pressure was also elevated today 183/75 and recently has been difficult to control.  His primary care provider recommended increasing his clonidine to 0.1 mg 3 times daily, however he has had significant daytime fatigue with this.  03/16/2017  [name] returns today for follow-up of heart failure.  His weight back in August was 185 pounds and he gained up to 204 pounds.  When I last saw him I doubled his diuretic and increased his blood pressure medication.  Over the past 2 weeks he has diuresed back down to 182 pounds.  He feels markedly better.  Blood pressure is better controlled today 132/58.  He feels like his energy level has improved.  We obtain blood work when he was in congestive heart failure which showed an elevated creatinine over 2 and a baseline around 1.6.  He was hypokalemic and I increased his potassium.  His echo showed an EF of 65-70% with grade 2  diastolic dysfunction and high LV filling pressures.  06/19/2017  [name] was seen today in follow-up.  I last saw him in January, at which time he was complaining of shortness of breath and edema.  Weight was up to 204 pounds.  He was given diuretics and his weight has come down significantly.  In fact, his creatinine rose up to 2.7 and weight was down to 182.  He saw his nephrologist, Dr. Signe Colt, recently who decreased his Lasix further and started amlodipine 10 mg daily.  He now says over the past several months he has had progressive fatigue, decreased exercise tolerance and worsening shortness of breath.  He wonders if it is related to his aortic valve.  In January was noted to have normal systolic function and mild aortic stenosis.  On exam, he has what sounds like moderate left ear with an audible second heart sound.  He  again looks volume overloaded today and weight is back up to 193 pounds, up from 182 pounds.  He denies any chest pain.  01/02/2018  [name] returns today for follow-up of his heart failure.  Recently he has been seen by nephrology.  He felt that he is renal failure has been fairly stable.  Plans were to possibly resume ARB therapy.  His aortic stenosis is mild to moderate and followed by [place].  His EKG shows sinus rhythm at 65.  He denies any worsening shortness of breath.  Weight has been stable.  I had referred him for heart cath in April 2019 which showed two-vessel coronary disease with 3 out of 3 patent bypass grafts.  Medical therapy was recommended however it was felt to be volume overloaded.  Subsequently he was admitted with heart failure and diuresis.  His weight now is down significantly to 170 pounds from 182 most recently.  He seems to be feeling much better.  08/06/2019  [name] was again admitted for heart failure in February of this past year.  Ultimately he had progression of renal failure with hypokalemia and congestive heart failure and transition to peritoneal dialysis.  Since then he is done much better and is fairly stable.  Weight is come down to about 155 although measured 160 in our office today.  Blood pressure has been well controlled at home.  He denies any palpitations or chest pain.  He is noted to have a history of aortic stenosis which sounds mild to moderate on physical exam.  EKG personally reviewed today shows sinus rhythm.  01/09/2020  [name] returns today for follow-up.  More recently he has noted increasing fatigue and shortness of breath.  He had an echo earlier in the year which showed mild to moderate aortic stenosis however by exam it appeared to be more moderate to severe in my opinion.  We were going to schedule repeat echo earlier next year however I think he should have it sooner.  He denies any anginal symptoms.  Blood pressure is well controlled  today in fact at times he is log indicates his blood pressure may get a little too low.  This could be contributing to some symptoms of dizziness that he is reporting.  EKG today shows a sinus bradycardia with voltage criteria for LVH at 55.  07/14/2018  [name] is seen today in follow-up.  He reports his dizziness is improved somewhat.  I had decreased his hydralazine but also they made some changes in his dialysis.  He apparently does home hemodialysis through peritoneal catheter.  He is followed by Dr. Signe Colt.  He had mild to moderate aortic stenosis with a mean gradient of 19 mmHg in December 2021.  He will need a repeat echo in about a year.  He underwent vascular lower extremity Dopplers in March after seeing Bettina Gavia, PA-C and was then referred to Dr. Allyson Sabal for PAD.  He was not felt to have critical limb ischemia or lifestyle limiting claudication and therefore medical therapy was recommended.  07/07/2021  [name] is seen today in follow-up.  He just saw Edd Fabian, NP about a month ago.  Overall seems like he was doing fairly well.  Heart failure seems stable.  He does have issues with ongoing dizziness which is his main complaint.  I do think this is multifactorial probably having to do with peripheral neuropathy/PAD.  He seems to be stable with regards to weight.  He did have a repeat echo which showed more moderate aortic stenosis.  Mean gradient about 24 mmHg.  On exam it sounds more moderate to severe with a faint second heart sound.  Is not clear if this is contributing to his dizziness but I suspect not.  He will need close follow-up though.  01/09/2022  [name] is seen today in follow-up.  He continues to have issues with dizziness.  He had a repeat echo to assess his aortic stenosis which is still moderate.  LV function is normal.  He had carotid Dopplers which showed no significant obstruction.  He has had issues with hypertension.  The blood pressure is quite high  today although he did not take his blood pressure meds.  Apparently there was a death of a family member last night and he had another family member die fairly recently.  There may be a lot more stress that he is under.  He said he did not eat at all today but he did have a Dexcom blood glucose Tate on which was reading quite high, 271 in the office and 300 mg/dL this morning which was a fasting reading.  He is on long-acting insulin.  Blood sugars managed by his PCP.  He continues on home peritoneal dialysis.  04/21/2022  [name] is seen today in follow-up.  He is accompanied by his wife.  She notes that he has been breathing more heavily recently but not necessarily more short of breath and this seems to be at rest when sitting in a recliner.  He is on home PD but that does not give him the ability to ultrafiltrate.  He also takes twice daily diuretic.  He reports his weight has been fairly stable.  Blood pressure is well-controlled.  He denies any chest pain.  EKG today shows some sinus bradycardia with inferior ST and T wave changes.  He had a recent echo which shows stable moderate aortic valve stenosis in September 2023 with normal LVEF.  PMHx:  Past Medical History:  Diagnosis Date   Acute coronary syndrome (HCC) 04/18/2019   Aortic stenosis 02/27/2020   mild to moderate AS   Arthritis    "a little in LLE" (06/20/2017)   Ataxia    Chronic diastolic CHF (congestive heart failure) (HCC)    Coronary artery disease    a. s/p CABG 1980s. b. stable by cath 05/2017.   Dizziness    Dyslipidemia    Edema 08/08/2017   HANDS & LOWER EXTREMITES   ESRD on hemodialysis (HCC)    Hypertension    Hypoalbuminemia    Nephrotic range  proteinuria    Peripheral artery disease (HCC)    mild   Pneumonia ~ 1950 X 1   Prostate cancer (HCC) ~ 2015   Type II diabetes mellitus (HCC)     Past Surgical History:  Procedure Laterality Date   CAPD REVISION N/A 06/29/2022   Procedure: REPLACEMENT OF  PERITONEAL DIALYSIS CATHETER;  Surgeon: Leonie Douglas, MD;  Location: [facility] OR;  Service: Vascular;  Laterality: N/A;   CARDIAC CATHETERIZATION     "a couple times" (06/20/2017)   CATARACT EXTRACTION W/ INTRAOCULAR LENS  IMPLANT, BILATERAL Bilateral    COLONOSCOPY     CORONARY ARTERY BYPASS GRAFT  03/06/1985   LAD and first diagonal/circumflex (sequential saphenous vein graft,cardioplegia   INGUINAL HERNIA REPAIR Right 12/06/2020   Procedure: OPEN RIGHT INGUINAL HERNIA REPAIR;  Surgeon: Luretha Murphy, MD;  Location: [facility] ORS;  Service: General;  Laterality: Right;   INSERTION PROSTATE RADIATION SEED  ~ 2015   IR FLUORO GUIDE CV LINE RIGHT  06/26/2022   IR US GUIDE VASC ACCESS RIGHT  06/26/2022   LAPAROSCOPIC LYSIS OF ADHESIONS N/A 06/29/2022   Procedure: LAPAROSCOPIC LYSIS OF ADHESIONS;  Surgeon: Leonie Douglas, MD;  Location: [facility] OR;  Service: Vascular;  Laterality: N/A;   LAPAROSCOPY N/A 06/29/2022   Procedure: LAPAROSCOPY DIAGNOSTIC;  Surgeon: Leonie Douglas, MD;  Location: [facility] OR;  Service: Vascular;  Laterality: N/A;   NM MYOCAR PERF WALL MOTION  09/12/2006   no ischemia   REMOVAL OF A DIALYSIS CATHETER N/A 07/04/2022   Procedure: PERITONEAL CATHETER REMOVAL;  Surgeon: Victorino Sparrow, MD;  Location: [facility] OR;  Service: Vascular;  Laterality: N/A;   RIGHT/LEFT HEART CATH AND CORONARY/GRAFT ANGIOGRAPHY N/A 06/21/2017   Procedure: RIGHT/LEFT HEART CATH AND CORONARY/GRAFT ANGIOGRAPHY;  Surgeon: Kathleene Hazel, MD;  Location: [facility] INVASIVE CV LAB;  Service: Cardiovascular;  Laterality: N/A;   US ECHOCARDIOGRAPHY  11/04/2008   trace TR & MR    FAMHx:  Family History  Problem Relation Age of Onset   Heart attack Mother     SOCHx:   reports that he quit smoking about 50 years ago. His smoking use included cigarettes. He started smoking about 70 years ago. He has a 20 pack-year smoking history. He has never used smokeless tobacco. He reports that he does not currently use alcohol. He  reports that he does not use drugs.  ALLERGIES:  Allergies  Allergen Reactions   Clonidine Hcl     Other reaction(s): constipation    ROS: Pertinent items noted in HPI and remainder of comprehensive ROS otherwise negative.  HOME MEDS: Current Outpatient Medications  Medication Sig Dispense Refill   aspirin EC 81 MG tablet Take 81 mg by mouth daily.     atorvastatin (LIPITOR) 80 MG tablet TAKE 1 TABLET BY MOUTH EVERY DAY 90 tablet 2   B Complex-C (B COMPLEX-VITAMIN C) CAPS Take by mouth.     Carboxymethylcellulose Sod PF 0.5 % SOLN 1 drop into affected eye as needed     carvedilol (COREG) 3.125 MG tablet TAKE 1 TABLET BY MOUTH TWICE A DAY WITH FOOD 180 tablet 1   furosemide (LASIX) 80 MG tablet Take 1 tablet (80 mg total) by mouth every Monday, Wednesday, and Friday. 180 tablet 3   gentamicin cream (GARAMYCIN) 0.1 % Apply topically daily.     hydrALAZINE (APRESOLINE) 25 MG tablet Take 1 tablet (25 mg total) by mouth every 12 (twelve) hours. 60 tablet 0   insulin glargine (LANTUS) 100 UNIT/ML injection  Inject 13 Units into the skin at bedtime.     nitroGLYCERIN (NITROSTAT) 0.4 MG SL tablet Place 1 tablet (0.4 mg total) under the tongue every 5 (five) minutes x 3 doses as needed for chest pain. 25 tablet 4   Nutritional Supplements (,FEEDING SUPPLEMENT, PROSOURCE PLUS) liquid Take 30 mLs by mouth 2 (two) times daily between meals. 887 mL 0   OVER THE COUNTER MEDICATION Place 1 spray into the nose daily as needed. Sovereign Silver Nasal Spray for Immune Support     pantoprazole (PROTONIX) 40 MG tablet Take 1 tablet (40 mg total) by mouth daily. 30 tablet 0   polyethylene glycol (MIRALAX / GLYCOLAX) 17 g packet Take 17 g by mouth daily as needed for mild constipation. 14 each 0   Propylene Glycol (SYSTANE COMPLETE) 0.6 % SOLN Place 1 drop into both eyes daily as needed (Dry eye).     TRADJENTA 5 MG TABS tablet Take 5 mg by mouth every morning.      traZODone (DESYREL) 50 MG tablet Take 50  mg by mouth at bedtime as needed for sleep.   12   Azelastine HCl 0.15 % SOLN Place 1 spray into both nostrils daily as needed (allergies). (Patient not taking: Reported on 09/29/2022)     sevelamer carbonate (RENVELA) 800 MG tablet Take 800 mg by mouth 5 (five) times daily. (Patient not taking: Reported on 09/29/2022)     No current facility-administered medications for this visit.    LABS/IMAGING: No results found for this or any previous visit (from the past 48 hour(s)).  No results found.  VITALS: BP (!) 120/58 (BP Location: Left Arm, Patient Position: Sitting, Cuff Size: Normal)   Pulse 63   Ht 5\' 11"  (1.803 m)   Wt 147 lb 3.2 oz (66.8 kg)   SpO2 99%   BMI 20.53 kg/m   EXAM: General appearance: alert, no distress and moderately obese Neck: no carotid bruit, no JVD and thyroid not enlarged, symmetric, no tenderness/mass/nodules Lungs: clear to auscultation bilaterally Heart: regular rate and rhythm, S1, S2 normal and systolic murmur: systolic ejection 3/6, crescendo at 2nd right intercostal space Abdomen: soft, non-tender; bowel sounds normal; no masses,  no organomegaly Extremities: extremities normal, atraumatic, no cyanosis or edema Pulses: 2+ and symmetric Skin: Skin color, texture, turgor normal. No rashes or lesions Neurologic: Grossly normal Psych: Pleasant  EKG: Bradycardia at 59, inferior ST and T wave changes-personally reviewed  ASSESSMENT: 1.   Chronic diastolic congestive heart failure ESRD on PD Aortic stenosis Coronary artery disease status post CABG in 1987 -patent bypass grafts by cath (05/2017) Hypertension Hyperlipidemia Diabetes type 2 Mild PAD - intermittent claudication, not lifestyle limiting Possible rheumatoid arthritis  PLAN:  1.   Mr. Grieder reportedly has had an abnormal respiratory pattern demonstrating some increasing work of breathing but not necessarily worsening shortness of breath.  His weights have been fairly stable.  He should  monitor for some development of acute on chronic diastolic congestive heart failure and the need to potentially increase his diuretic.  He still does make urine.  I would not recommend med changes at this time.  Plan follow-up with [place] in 6 months or sooner as necessary.  Chrystie Nose, MD, [facility], FACP  [facility]  [facility]  Medical Director of the [facility] Lipid [facility] &  Cardiovascular Risk Reduction Clinic Attending Cardiologist  Direct Dial: [phone]  Fax: [phone]  Website:  [email or web address]  Lisette Abu  09/29/2022, 2:40 PM

## 2022-09-29 NOTE — Patient Instructions (Signed)
Medication Instructions:  NO CHANGES  *If you need a refill on your cardiac medications before your next appointment, please call your pharmacy*  Testing/Procedures: Your physician has requested that you have an echocardiogram. Echocardiography is a painless test that uses sound waves to create images of your heart. It provides your doctor with information about the size and shape of your heart and how well your heart's chambers and valves are working. This procedure takes approximately one hour. There are no restrictions for this procedure. Please do NOT wear cologne, perfume, aftershave, or lotions (deodorant is allowed). Please arrive 15 minutes prior to your appointment time.  SCHEDULE AS SOON as able   Follow-Up: At [facility], you and your health needs are our priority.  As part of our continuing mission to provide you with exceptional heart care, we have created designated Provider Care Teams.  These Care Teams include your primary Cardiologist (physician) and Advanced Practice Providers (APPs -  Physician Assistants and Nurse Practitioners) who all work together to provide you with the care you need, when you need it.  We recommend signing up for the patient portal called "MyChart".  Sign up information is provided on this After Visit Summary.  MyChart is used to connect with patients for Virtual Visits (Telemedicine).  Patients are able to view lab/test results, encounter notes, upcoming appointments, etc.  Non-urgent messages can be sent to your provider as well.   To learn more about what you can do with MyChart, go to [email or web address].    Your next appointment:    3 months with Dr. [name]

## 2022-10-02 ENCOUNTER — Other Ambulatory Visit: Payer: Self-pay | Admitting: Internal Medicine

## 2022-10-06 ENCOUNTER — Ambulatory Visit (HOSPITAL_COMMUNITY)
Admission: RE | Admit: 2022-10-06 | Discharge: 2022-10-06 | Disposition: A | Discharge status: Home / Self Care | Payer: Medicare Other | Source: Ambulatory Visit | Attending: Internal Medicine | Admitting: Internal Medicine

## 2022-10-06 DIAGNOSIS — I509 Heart failure, unspecified: Secondary | ICD-10-CM | POA: Diagnosis not present

## 2022-10-06 DIAGNOSIS — I352 Nonrheumatic aortic (valve) stenosis with insufficiency: Secondary | ICD-10-CM | POA: Diagnosis not present

## 2022-10-06 DIAGNOSIS — E785 Hyperlipidemia, unspecified: Secondary | ICD-10-CM | POA: Diagnosis not present

## 2022-10-06 DIAGNOSIS — I350 Nonrheumatic aortic (valve) stenosis: Secondary | ICD-10-CM | POA: Diagnosis not present

## 2022-10-06 DIAGNOSIS — I110 Hypertensive heart disease with heart failure: Secondary | ICD-10-CM | POA: Diagnosis not present

## 2022-10-06 DIAGNOSIS — E119 Type 2 diabetes mellitus without complications: Secondary | ICD-10-CM | POA: Insufficient documentation

## 2022-10-06 DIAGNOSIS — Z951 Presence of aortocoronary bypass graft: Secondary | ICD-10-CM | POA: Diagnosis not present

## 2022-10-06 DIAGNOSIS — R0602 Shortness of breath: Secondary | ICD-10-CM | POA: Insufficient documentation

## 2022-10-06 DIAGNOSIS — I2510 Atherosclerotic heart disease of native coronary artery without angina pectoris: Secondary | ICD-10-CM | POA: Diagnosis not present

## 2022-10-10 ENCOUNTER — Other Ambulatory Visit (HOSPITAL_COMMUNITY): Payer: Self-pay | Admitting: *Deleted

## 2022-10-10 DIAGNOSIS — I350 Nonrheumatic aortic (valve) stenosis: Secondary | ICD-10-CM

## 2023-01-01 ENCOUNTER — Encounter: Payer: Self-pay | Admitting: Internal Medicine

## 2023-01-01 ENCOUNTER — Ambulatory Visit: Payer: Medicare Other | Attending: Internal Medicine | Admitting: Internal Medicine

## 2023-01-01 VITALS — BP 102/56 | HR 62 | Ht 71.0 in | Wt 145.0 lb

## 2023-01-01 DIAGNOSIS — I25810 Atherosclerosis of coronary artery bypass graft(s) without angina pectoris: Secondary | ICD-10-CM

## 2023-01-01 DIAGNOSIS — R0600 Dyspnea, unspecified: Secondary | ICD-10-CM

## 2023-01-01 DIAGNOSIS — I10 Essential (primary) hypertension: Secondary | ICD-10-CM

## 2023-01-01 DIAGNOSIS — R5383 Other fatigue: Secondary | ICD-10-CM | POA: Diagnosis not present

## 2023-01-01 NOTE — Progress Notes (Signed)
OFFICE NOTE   Chief Complaint:  Follow-up as of breath, echo  Primary Care Physician: Linus Galas, NP  HPI:  [name] is a 89 year old with a history of coronary artery disease status post CABG in 1987, hypertension, dyslipidemia, diabetes, and peripheral arterial disease which is mild. Over the past year he has done well from a cardiac standpoint. He does have a history of prostate cancer and underwent radiation seed implant for that. He has also been having problems with confusion and brain fog and his medications were adjusted, including holding his statin, which did not cause much improvement. This may be some early dementia. In addition, he has abnormal lower extremity Dopplers, which I repeated. This has actually shown mild improvement in his ABIs with ABI 1.1 on the right and ABI of 0.92 on the left. The bilateral posterior tibials were occluded and there was 50% to 69% reduction in left common iliac and about a 0 to 49% reduction in the right superficial femoral. Despite this, he has no symptoms of claudication. He also denies any chest pain. He has had some shortness of breath and this, however, seems to be related to this episode over the past several months where he reported initially he was thought stung by a bee, developed swelling on the left side of his face and then subsequently developed extreme weakness and there as concern of a rheumatoid arthritis. His symptoms have improved with a steroid burst and tapered. He is currently on prednisone as well as methotrexate. He has also had recent dizziness. He reports head and sinus congestion, which may be contributing to his symptoms. However some of the dizziness is associated with walking up a hill at the end of exercise or walking down hills particularly. Is not associated with change in position or quick head movements.  There is some associated fatigue and chest fullness with exercise and improves with rest, reminiscent of  possible angina.  He underwent bilateral carotid Dopplers which showed a mild amount of plaque. A nuclear stress test performed which demonstrated no reversible ischemia and preserved ejection fraction. He still reports he has some mild dizziness and does fatigue a little at the beginning of exercise but improves when he continues to exercise.  He is generally without complaints today. He reports that recently blood work indicated that his kidney function was worsening and that he is now scheduled to see a nephrologist next Monday. I do not yet have those results in front of me. His metformin was discontinued due to this but he remains on lisinopril and that may need to be adjusted. He denies any specific anginal symptoms. He has some very mild claudication symptoms but it improves with walking. He continues to have a systolic murmur best heard over the aortic area and does have a history of aortic sclerosis in the past.  [name] returns today for follow-up. Unfortunately says he hasn't taken his medicine today. He is supposed to take hydralazine 100 mg 3 times a day and for some reason has not taken 2 doses this morning. Blood pressure was very high at 196/86 but he denied any chest pain or headache. He says he going to take his medicine when he gets home. Is also on Flomax but no other blood pressure medicines. This is probably due to worsening renal function. Currently his creatinine is around 3 and is followed by Dr. Briant Cedar. Is also on Lipitor and cholesterol was recently checked by Dr. Juleen China.  10/07/2015  [name] returns today for follow-up. Over the past year he's had no new complaints although his blood pressures been difficult to control. He's followed by Dr. Briant Cedar. He was previously on amlodipine but that's been discontinued. He is now on hydralazine, clonidine and valsartan. He did not take his medicines today and his blood pressure is elevated. He says he takes the clonidine once  or twice per day. He tries to void in the morning because it causes extreme fatigue. We had previously discussed the Catapres patch however cost was limiting. He also reports recently some worsening memory.  10/16/2016  [name] returns for follow-up. Many years since I last saw him. He's called in for some chest pain which she said got better. His last stress test was in 2016 which was negative for ischemia. He was noted to have some aortic sclerosis in 2015 however last year was noted to have a louder systolic murmur which sounds more like aortic stenosis. Blood pressures not been well controlled. Today's elevated again 160/70. He says his blood pressure medicines make him feel tired. He told me that he takes all for his blood pressure medicines at night. When asked further about his hydralazine which is supposed to be taken 3 times a day he says he does not do that. He then reported that his blood pressure typically runs higher in the morning and throughout the day. He currently denies any chest pain. He reports some left lower strandy swelling but also has numbness and tingling has been undergoing and back injections.  10/27/2016  [name] had adjustments on his medications. He is now taking a hydralazine 3 times a day, irbesartan in the morning and clonidine at night. I reviewed his blood pressures which tend to range in the 140s in the morning before taking medication, in the 120s in the afternoon after medication and typically in the 120s over 80s at night. This indicates adequate treatment of his blood pressure. He did undergo an echocardiogram for aortic stenosis. This demonstrated a normal LVEF of 60-65% with mild aortic stenosis and a mean gradient of 11 mmHg. He understands the implications of this and that we will continue to follow his aortic valve disease.  02/28/2017  [name] presents today with significant shortness of breath, weight gain and leg edema as well as increasing  abdominal girth over the past 2-3 weeks.  The symptoms came on gradually but clinically worsened.  Weight in August was 185 pounds, he now weighs 204 pounds.  Reports worsening shortness of breath, orthopnea and PND.  Blood pressure was also elevated today 183/75 and recently has been difficult to control.  His primary care provider recommended increasing his clonidine to 0.1 mg 3 times daily, however he has had significant daytime fatigue with this.  03/16/2017  Mr. Everingham returns today for follow-up of heart failure.  His weight back in August was 185 pounds and he gained up to 204 pounds.  When I last saw him I doubled his diuretic and increased his blood pressure medication.  Over the past 2 weeks he has diuresed back down to 182 pounds.  He feels markedly better.  Blood pressure is better controlled today 132/58.  He feels like his energy level has improved.  We obtain blood work when he was in congestive heart failure which showed an elevated creatinine over 2 and a baseline around 1.6.  He was hypokalemic and I increased his potassium.  His echo showed an EF of 65-70%  with grade 2 diastolic dysfunction and high LV filling pressures.  06/19/2017  Mr. Asch was seen today in follow-up.  I last saw him in January, at which time he was complaining of shortness of breath and edema.  Weight was up to 204 pounds.  He was given diuretics and his weight has come down significantly.  In fact, his creatinine rose up to 2.7 and weight was down to 182.  He saw his nephrologist, Dr. Signe Colt, recently who decreased his Lasix further and started amlodipine 10 mg daily.  He now says over the past several months he has had progressive fatigue, decreased exercise tolerance and worsening shortness of breath.  He wonders if it is related to his aortic valve.  In January was noted to have normal systolic function and mild aortic stenosis.  On exam, he has what sounds like moderate left ear with an audible second heart  sound.  He again looks volume overloaded today and weight is back up to 193 pounds, up from 182 pounds.  He denies any chest pain.  01/02/2018  Mr. Anthes returns today for follow-up of his heart failure.  Recently he has been seen by nephrology.  He felt that he is renal failure has been fairly stable.  Plans were to possibly resume ARB therapy.  His aortic stenosis is mild to moderate and followed by [place].  His EKG shows sinus rhythm at 65.  He denies any worsening shortness of breath.  Weight has been stable.  I had referred him for heart cath in April 2019 which showed two-vessel coronary disease with 3 out of 3 patent bypass grafts.  Medical therapy was recommended however it was felt to be volume overloaded.  Subsequently he was admitted with heart failure and diuresis.  His weight now is down significantly to 170 pounds from 182 most recently.  He seems to be feeling much better.  08/06/2019  Mr. Nomura was again admitted for heart failure in February of this past year.  Ultimately he had progression of renal failure with hypokalemia and congestive heart failure and transition to peritoneal dialysis.  Since then he is done much better and is fairly stable.  Weight is come down to about 155 although measured 160 in our office today.  Blood pressure has been well controlled at home.  He denies any palpitations or chest pain.  He is noted to have a history of aortic stenosis which sounds mild to moderate on physical exam.  EKG personally reviewed today shows sinus rhythm.  01/09/2020  Mr. Sullenger returns today for follow-up.  More recently he has noted increasing fatigue and shortness of breath.  He had an echo earlier in the year which showed mild to moderate aortic stenosis however by exam it appeared to be more moderate to severe in my opinion.  We were going to schedule repeat echo earlier next year however I think he should have it sooner.  He denies any anginal symptoms.  Blood pressure is well  controlled today in fact at times he is log indicates his blood pressure may get a little too low.  This could be contributing to some symptoms of dizziness that he is reporting.  EKG today shows a sinus bradycardia with voltage criteria for LVH at 55.  07/14/2018  Mr. Yonker is seen today in follow-up.  He reports his dizziness is improved somewhat.  I had decreased his hydralazine but also they made some changes in his dialysis.  He apparently does home hemodialysis  through peritoneal catheter.  He is followed by Dr. Signe Colt.  He had mild to moderate aortic stenosis with a mean gradient of 19 mmHg in December 2021.  He will need a repeat echo in about a year.  He underwent vascular lower extremity Dopplers in March after seeing Bettina Gavia, PA-C and was then referred to Dr. Allyson Sabal for PAD.  He was not felt to have critical limb ischemia or lifestyle limiting claudication and therefore medical therapy was recommended.  07/07/2021  Mr. Zimmers is seen today in follow-up.  He just saw Edd Fabian, NP about a month ago.  Overall seems like he was doing fairly well.  Heart failure seems stable.  He does have issues with ongoing dizziness which is his main complaint.  I do think this is multifactorial probably having to do with peripheral neuropathy/PAD.  He seems to be stable with regards to weight.  He did have a repeat echo which showed more moderate aortic stenosis.  Mean gradient about 24 mmHg.  On exam it sounds more moderate to severe with a faint second heart sound.  Is not clear if this is contributing to his dizziness but I suspect not.  He will need close follow-up though.  01/09/2022  Mr. Howton is seen today in follow-up.  He continues to have issues with dizziness.  He had a repeat echo to assess his aortic stenosis which is still moderate.  LV function is normal.  He had carotid Dopplers which showed no significant obstruction.  He has had issues with hypertension.  The blood pressure is  quite high today although he did not take his blood pressure meds.  Apparently there was a death of a family member last night and he had another family member die fairly recently.  There may be a lot more stress that he is under.  He said he did not eat at all today but he did have a Dexcom blood glucose meter on which was reading quite high, 271 in the office and 300 mg/dL this morning which was a fasting reading.  He is on long-acting insulin.  Blood sugars managed by his PCP.  He continues on home peritoneal dialysis.  04/21/2022  Mr. Mcfann is seen today in follow-up.  He is accompanied by his wife.  She notes that he has been breathing more heavily recently but not necessarily more short of breath and this seems to be at rest when sitting in a recliner.  He is on home PD but that does not give him the ability to ultrafiltrate.  He also takes twice daily diuretic.  He reports his weight has been fairly stable.  Blood pressure is well-controlled.  He denies any chest pain.  EKG today shows some sinus bradycardia with inferior ST and T wave changes.  He had a recent echo which shows stable moderate aortic valve stenosis in September 2023 with normal LVEF.  10/01/2022  Mr. Morain returns today for follow-up.  His last echo was in September 2023 which showed moderate aortic valve stenosis.  Since I last saw him, unfortunately he was hospitalized with peritoneal catheter dislodgment and issues in dialysis.  Subsequently he had a temporary dialysis catheter placed and is still undergoing hemodialysis.  It is not clear whether he will be proceeding to a fistula or whether he may return back to PD.  I suspect that he will likely remain on HD long-term.  He does not know if he has a follow-up with his nephrologist.  He  has reported worsening fatigue and dyspnea on exertion, also notably with minimal effort.  01/01/2023  Mr. Tomko returns today to follow-up shortness of breath and echo findings.  His echo  showed normal systolic function with moderate aortic stenosis.  The dimensionless index was 0.30, suggesting moderate to severe AAS but still moderate.  It is unlikely that this is causing his symptoms.  He seems to be very fatigued and short of breath.  His symptoms are definitely worse during and after dialysis which is not unusual but he says he has other symptoms away from dialysis.  It seems to been worsening on dialysis.  It is possible he could be dry or then pulling off too much fluid.  His dry weight is only 145 pounds where he is today.  He has lost quite a bit of weight.  He denies any chest pain.  He does have a history of coronary disease including three-vessel bypass in the past but that has not been recently reassessed.  PMHx:  Past Medical History:  Diagnosis Date   Acute coronary syndrome (HCC) 04/18/2019   Aortic stenosis 02/27/2020   mild to moderate AS   Arthritis    "a little in LLE" (06/20/2017)   Ataxia    Chronic diastolic CHF (congestive heart failure) (HCC)    Coronary artery disease    a. s/p CABG 1980s. b. stable by cath 05/2017.   Dizziness    Dyslipidemia    Edema 08/08/2017   HANDS & LOWER EXTREMITES   ESRD on hemodialysis (HCC)    Hypertension    Hypoalbuminemia    Nephrotic range proteinuria    Peripheral artery disease (HCC)    mild   Pneumonia ~ 1950 X 1   Prostate cancer (HCC) ~ 2015   Type II diabetes mellitus (HCC)     Past Surgical History:  Procedure Laterality Date   CAPD REVISION N/A 06/29/2022   Procedure: REPLACEMENT OF PERITONEAL DIALYSIS CATHETER;  Surgeon: Leonie Douglas, MD;  Location: [facility] OR;  Service: Vascular;  Laterality: N/A;   CARDIAC CATHETERIZATION     "a couple times" (06/20/2017)   CATARACT EXTRACTION W/ INTRAOCULAR LENS  IMPLANT, BILATERAL Bilateral    COLONOSCOPY     CORONARY ARTERY BYPASS GRAFT  03/06/1985   LAD and first diagonal/circumflex (sequential saphenous vein graft,cardioplegia   INGUINAL HERNIA REPAIR Right  12/06/2020   Procedure: OPEN RIGHT INGUINAL HERNIA REPAIR;  Surgeon: Luretha Murphy, MD;  Location: [facility] ORS;  Service: General;  Laterality: Right;   INSERTION PROSTATE RADIATION SEED  ~ 2015   IR FLUORO GUIDE CV LINE RIGHT  06/26/2022   IR US GUIDE VASC ACCESS RIGHT  06/26/2022   LAPAROSCOPIC LYSIS OF ADHESIONS N/A 06/29/2022   Procedure: LAPAROSCOPIC LYSIS OF ADHESIONS;  Surgeon: Leonie Douglas, MD;  [facility];  Service: Vascular;  Laterality: N/A;   LAPAROSCOPY N/A 06/29/2022   Procedure: LAPAROSCOPY DIAGNOSTIC;  Surgeon: Leonie Douglas, MD;  Location: [facility] OR;  Service: Vascular;  Laterality: N/A;   NM MYOCAR PERF WALL MOTION  09/12/2006   no ischemia   REMOVAL OF A DIALYSIS CATHETER N/A 07/04/2022   Procedure: PERITONEAL CATHETER REMOVAL;  Surgeon: Victorino Sparrow, MD;  Location: [facility] OR;  Service: Vascular;  Laterality: N/A;   RIGHT/LEFT HEART CATH AND CORONARY/GRAFT ANGIOGRAPHY N/A 06/21/2017   Procedure: RIGHT/LEFT HEART CATH AND CORONARY/GRAFT ANGIOGRAPHY;  Surgeon: Kathleene Hazel, MD;  Location: [facility] INVASIVE CV LAB;  Service: Cardiovascular;  Laterality: N/A;   US ECHOCARDIOGRAPHY  11/04/2008   trace TR & MR    FAMHx:  Family History  Problem Relation Age of Onset   Heart attack Mother     SOCHx:   reports that he quit smoking about 50 years ago. His smoking use included cigarettes. He started smoking about 70 years ago. He has a 20 pack-year smoking history. He has never used smokeless tobacco. He reports that he does not currently use alcohol. He reports that he does not use drugs.  ALLERGIES:  Allergies  Allergen Reactions   Clonidine Hcl     Other reaction(s): constipation    ROS: Pertinent items noted in HPI and remainder of comprehensive ROS otherwise negative.  HOME MEDS: Current Outpatient Medications  Medication Sig Dispense Refill   aspirin EC 81 MG tablet Take 81 mg by mouth daily.     atorvastatin (LIPITOR) 80 MG tablet TAKE 1 TABLET BY MOUTH  EVERY DAY 90 tablet 2   Azelastine HCl 0.15 % SOLN Place 1 spray into both nostrils daily as needed (allergies).     B Complex-C (B COMPLEX-VITAMIN C) CAPS Take by mouth.     Carboxymethylcellulose Sod PF 0.5 % SOLN 1 drop into affected eye as needed     carvedilol (COREG) 3.125 MG tablet TAKE 1 TABLET BY MOUTH TWICE A DAY WITH FOOD 180 tablet 1   furosemide (LASIX) 80 MG tablet Take 1 tablet (80 mg total) by mouth every Monday, Wednesday, and Friday. 180 tablet 3   gentamicin cream (GARAMYCIN) 0.1 % Apply topically daily.     hydrALAZINE (APRESOLINE) 25 MG tablet Take 1 tablet (25 mg total) by mouth every 12 (twelve) hours. 60 tablet 0   insulin glargine (LANTUS) 100 UNIT/ML injection Inject 13 Units into the skin at bedtime.     nitroGLYCERIN (NITROSTAT) 0.4 MG SL tablet Place 1 tablet (0.4 mg total) under the tongue every 5 (five) minutes x 3 doses as needed for chest pain. 25 tablet 4   Nutritional Supplements (,FEEDING SUPPLEMENT, PROSOURCE PLUS) liquid Take 30 mLs by mouth 2 (two) times daily between meals. 887 mL 0   OVER THE COUNTER MEDICATION Place 1 spray into the nose daily as needed. Sovereign Silver Nasal Spray for Immune Support     pantoprazole (PROTONIX) 40 MG tablet Take 1 tablet (40 mg total) by mouth daily. 30 tablet 0   polyethylene glycol (MIRALAX / GLYCOLAX) 17 g packet Take 17 g by mouth daily as needed for mild constipation. 14 each 0   Propylene Glycol (SYSTANE COMPLETE) 0.6 % SOLN Place 1 drop into both eyes daily as needed (Dry eye).     sevelamer carbonate (RENVELA) 800 MG tablet Take 800 mg by mouth 5 (five) times daily.     TRADJENTA 5 MG TABS tablet Take 5 mg by mouth every morning.      traZODone (DESYREL) 50 MG tablet Take 50 mg by mouth at bedtime as needed for sleep.   12   No current facility-administered medications for this visit.    LABS/IMAGING: No results found for this or any previous visit (from the past 48 hour(s)).  No results  found.  VITALS: BP (!) 102/56   Pulse 62   Ht 5\' 11"  (1.803 m)   Wt 145 lb (65.8 kg)   SpO2 99%   BMI 20.22 kg/m   EXAM: General appearance: alert, no distress, and moderately obese Neck: no carotid bruit, no JVD, and thyroid not enlarged, symmetric, no tenderness/mass/nodules Lungs: clear to auscultation bilaterally Heart:  regular rate and rhythm, S1: normal, S2: Absent, and systolic murmur: systolic ejection 3/6, crescendo at 2nd right intercostal space, late peaking Abdomen: soft, non-tender; bowel sounds normal; no masses,  no organomegaly Extremities: extremities normal, atraumatic, no cyanosis or edema Pulses: 2+ and symmetric Skin: Skin color, texture, turgor normal. No rashes or lesions Neurologic: Grossly normal Psych: Pleasant  EKG: EKG Interpretation Date/Time:  Monday January 01 2023 15:26:19 EST Ventricular Rate:  61 PR Interval:  180 QRS Duration:  86 QT Interval:  414 QTC Calculation: 416 R Axis:   31  Text Interpretation: Normal sinus rhythm Abnormal QRS-T angle, consider primary T wave abnormality When compared with ECG of 29-Sep-2022 14:34, QRS axis Shifted left Non-specific change in ST segment in Anterior leads Nonspecific T wave abnormality has replaced inverted T waves in Inferior leads T wave inversion no longer evident in Lateral leads Confirmed by Zoila Shutter [phone]) on 01/01/2023 6:22:24 PM    ASSESSMENT: 1.   Chronic diastolic congestive heart failure ESRD on PD.  Now transition to HD via temporary catheter due to catheter dislodgment Aortic stenosis -moderate Coronary artery disease status post CABG in 1987 -patent bypass grafts by cath (05/2017) Hypertension Hyperlipidemia Diabetes type 2 Mild PAD - intermittent claudication, not lifestyle limiting Possible rheumatoid arthritis  PLAN:  1.   Mr. Witman continues to have shortness of breath and fatigue.  He seems euvolemic and they are putting a significant amount of volume off during  dialysis.  Is possible this could be causing his fatigue and worsening symptoms however he could also have had some progression of his coronary disease.  He has had prior three-vessel CABG but his grafts were patent and last assessed in 2019 by cath.  Echo showed normal systolic function with moderate aortic stenosis.  I do not think that this is likely contributing to his symptoms although could be an issue perhaps with aggressive dialysis.  Nonetheless I think it would be imperative to do a repeat coronary assessment.  I would advise a PET/CT to rule out any ischemia.  If this is negative I suspect he may need some alteration at dialysis because of his ongoing fatigue issues.  Plan follow-up with [place] in 3 months or sooner as necessary.  Chrystie Nose, MD, [facility], FACP  [place]  [facility]  Medical Director of the Advanced Lipid Disorders &  Cardiovascular Risk Reduction Clinic Attending Cardiologist  Direct Dial: [phone]  Fax: [phone]  Website:  [email or web address] 01/01/2023, 2:47 PM

## 2023-01-01 NOTE — Patient Instructions (Signed)
Medication Instructions:  NO CHANGES  *If you need a refill on your cardiac medications before your next appointment, please call your pharmacy*   Testing/Procedures: How to Prepare for Your Cardiac PET/CT Stress Test:  1. Please do not take these medications before your test:   Medications that may interfere with the cardiac pharmacological stress agent (ex. nitrates - including erectile dysfunction medications, isosorbide mononitrate- [please start to hold this medication the day before the test], tamulosin or beta-blockers) the day of the exam. (Erectile dysfunction medication should be held for at least 72 hrs prior to test) Theophylline containing medications for 12 hours. Dipyridamole 48 hours prior to the test. Your remaining medications may be taken with water.  2. Nothing to eat or drink, except water, 3 hours prior to arrival time.   NO caffeine/decaffeinated products, or chocolate 12 hours prior to arrival.  3. NO perfume, cologne or lotion on chest or abdomen area.          - FEMALES - Please avoid wearing dresses to this appointment.  4. Total time is 1 to 2 hours; you may want to bring reading material for the waiting time.  5. Please report to Radiology at the [facility] Main Entrance 30 minutes early for your test.  [address] [place], [place] [address]  6. Please report to Radiology at [facility] Main Entrance, medical mall, 30 mins prior to your test.  [address]  [place], [place]  [phone]  Diabetic Preparation:  Hold oral medications. You may take NPH and Lantus insulin. Do not take Humalog or Humulin R (Regular Insulin) the day of your test. Check blood sugars prior to leaving the house. If able to eat breakfast prior to 3 hour fasting, you may take all medications, including your insulin, Do not worry if you miss your breakfast dose of insulin - start at your next meal. Patients who wear a continuous  glucose monitor MUST remove the device prior to scanning.  IF YOU THINK YOU MAY BE PREGNANT, OR ARE NURSING PLEASE INFORM THE TECHNOLOGIST.  In preparation for your appointment, medication and supplies will be purchased.  Appointment availability is limited, so if you need to cancel or reschedule, please call the Radiology Department at [phone] [facility]) OR [phone] [facility])  24 hours in advance to avoid a cancellation fee of $100.00  What to Expect After you Arrive:  Once you arrive and check in for your appointment, you will be taken to a preparation room within the Radiology Department.  A technologist or Nurse will obtain your medical history, verify that you are correctly prepped for the exam, and explain the procedure.  Afterwards,  an IV will be started in your arm and electrodes will be placed on your skin for EKG monitoring during the stress portion of the exam. Then you will be escorted to the PET/CT scanner.  There, staff will get you positioned on the scanner and obtain a blood pressure and EKG.  During the exam, you will continue to be connected to the EKG and blood pressure machines.  A small, safe amount of a radioactive tracer will be injected in your IV to obtain a series of pictures of your heart along with an injection of a stress agent.    After your Exam:  It is recommended that you eat a meal and drink a caffeinated beverage to counter act any effects of the stress agent.  Drink plenty of fluids for the  remainder of the day and urinate frequently for the first couple of hours after the exam.  Your doctor will inform you of your test results within 7-10 business days.  For more information and frequently asked questions, please visit our website : [email or web address]  For questions about your test or how to prepare for your test, please call: Cardiac Imaging Nurse Navigators Office: [phone]    Follow-Up: At [facility], you and your  health needs are our priority.  As part of our continuing mission to provide you with exceptional heart care, we have created designated Provider Care Teams.  These Care Teams include your primary Cardiologist (physician) and Advanced Practice Providers (APPs -  Physician Assistants and Nurse Practitioners) who all work together to provide you with the care you need, when you need it.  We recommend signing up for the patient portal called "MyChart".  Sign up information is provided on this After Visit Summary.  MyChart is used to connect with patients for Virtual Visits (Telemedicine).  Patients are able to view lab/test results, encounter notes, upcoming appointments, etc.  Non-urgent messages can be sent to your provider as well.   To learn more about what you can do with MyChart, go to [email or web address].    Your next appointment:   End of December/January with Dr. [name] or PA/NP -- after testing

## 2023-02-09 NOTE — Progress Notes (Signed)
Cardiology Office Note:  .   Date:  02/16/2023  ID:  [name], DOB [birth year], MRN [id number] PCP: Linus Galas, NP   HeartCare Providers Cardiologist:  Chrystie Nose, MD  }   History of Present Illness: .   [name] is a 89 y.o. male with history of chronic diastolic heart failure, end-stage renal disease on hemodialysis, aortic stenosis (moderate), coronary artery disease status post CABG in 1987 with patent grafts by cath in 2019, hypertension, hyperlipidemia, mild PAD with intermittent claudication symptoms not lifestyle limiting, type 2 diabetes and rheumatoid arthritis.  Last seen by Dr. [name] on 01/01/2023.  At that time he continued to have shortness of breath and fatigue.  At that time Dr. [name] ordered PET/CT to rule out any new areas of ischemia.  PET scan was found to be very abnormal with large defect in the mid to basal lateral locations that is reversible with abnormal wall motion.  He comes today stating that his breathing status is remaining tenuous.  He has to stop taking showers because it wears him out.  He says his energy level is very low.  He is not able to do much of anything currently.  Of note he is on dialysis on Tuesdays Thursdays and Saturdays.  Next week being a holiday week with Christmas his schedule will change.  ROS: As above otherwise negative  Studies Reviewed: .     Cardiac PET: 02/24/2023 LV perfusion is abnormal. There is evidence of ischemia. Defect 1: There is a large defect with severe reduction in uptake present in the mid to basal lateral location(s) that is reversible. There is abnormal wall motion in the defect area. Consistent with ischemia.   Rest left ventricular function is normal. Rest EF: 52%. Stress left ventricular function is abnormal. Stress global function is moderately reduced. There was a single regional abnormality. Stress EF: 44%. End diastolic cavity size is normal. End systolic cavity size is normal.   Myocardial  blood flow reserve is not reported in this patient due to technical or patient-specific concerns that affect accuracy. Prior CABG   Coronary calcium assessment not performed due to prior revascularization.  Prior CABG   Findings are consistent with ischemia. The study is high risk.   Large lateral ischemic territory mid/base with TID and drop in EF with stress MBPF/Calcium not reported due to prior CABG  Physical Exam:   VS:  BP 130/62 (BP Location: Left Arm, Patient Position: Sitting, Cuff Size: Normal)   Pulse 63   Ht 5\' 11"  (1.803 m)   Wt 146 lb 3.2 oz (66.3 kg)   SpO2 98%   BMI 20.39 kg/m    Wt Readings from Last 3 Encounters:  02/16/23 146 lb 3.2 oz (66.3 kg)  01/01/23 145 lb (65.8 kg)  09/29/22 147 lb 3.2 oz (66.8 kg)    GEN: Well nourished, well developed in no acute distress.  External dialysis shunt left upper chest. NECK: No JVD; No carotid bruits CARDIAC: RRR, heard at the  high-pitched 2/6 systolic murmur left sternal border and into the apex, also 2/6 systolic murmur heard at the right sternal border.  Murmurs, rubs, gallops RESPIRATORY:  Clear to auscultation without rales, wheezing or rhonchi  ABDOMEN: Soft, non-tender, non-distended EXTREMITIES:  No edema; No deformity   ASSESSMENT AND PLAN: .    Coronary artery disease: History of coronary artery bypass grafting in 1987 (LIMA to LAD, SVG to diagonal and OM).  My recent cardiac catheterization in  2019 revealed patent grafts with mild filling defect seen in the proximal mid and distal segments of the SVG to diagonal and OM.).  Recurrent symptoms of dyspnea on exertion and profound fatigue with abnormal PET scan, with LV perfusion being abnormal with a large defect with severe reduction in uptake present in the mid to basal lateral location which was reversible.  LV systolic function 44% under stress.  Large lateral ischemic territory mid/base with 3 times daily and drop in EF with stress.            I have spoken with Dr.  Flora Lipps, DOD at [facility], who is reviewed the PET scan report.  And he is in agreement that the patient should move forward with a repeat cardiac catheterization, with possible intervention depending upon results.   I have talked to the patient and his wife about this in detail.  Scheduling will be done later today once they are able to call us back with his dialysis schedule.  They verbalized understanding of need to repeat cath and are willing to proceed.  He is advised not to do any strenuous activity and continue his current medication regimen.    Informed Consent   Shared Decision Making/Informed Consent The risks [stroke (1 in 1000), death (1 in 1000), kidney failure [usually temporary] (1 in 500), bleeding (1 in 200), allergic reaction [possibly serious] (1 in 200)], benefits (diagnostic support and management of coronary artery disease) and alternatives of a cardiac catheterization were discussed in detail with [name] and he is willing to proceed.  He is scheduled for December 31 with Dr. [name] at 9 AM.  He will take half dose of insulin night before procedure and hold it in the a.m., he will also hold his Lasix prior to the cardiac catheterization and hold his dose of Tradjenta until after cath at the discretion of Dr. [name].     2.  End-stage renal disease: Patient has dialysis on Tuesdays Thursdays and Saturdays normally.  However with holiday schedule the week of Christmas this will change.  She will call us back for his new schedule for that week which will help [place] determine date of cath.  3.  Chronic diastolic heart failure: Volume is managed through dialysis.  He has a temporary catheter in the right upper chest currently.  4.  Hypertension: Blood pressure is well-controlled today in the office.  He has been taking his medications as directed.  Will hold Lasix on day of cardiac catheterization.  5.  Hyperlipidemia: He should continue on statin therapy with a goal of LDL less  than 50.  He is currently on atorvastatin 80 mg daily.  Most recent labs in 2023 total cholesterol 170, LDL 74.  May need to consider adding Zetia or addition of PCSK9 inhibition if we cannot get him to goal on follow-up labs.  Signed, Bettey Mare. Liborio Nixon, ANP, AACC

## 2023-02-12 ENCOUNTER — Encounter (HOSPITAL_COMMUNITY): Payer: Self-pay

## 2023-02-13 ENCOUNTER — Telehealth (HOSPITAL_COMMUNITY): Payer: Self-pay | Admitting: *Deleted

## 2023-02-13 NOTE — Telephone Encounter (Signed)
Attempted to call patient regarding upcoming cardiac PET appointment. Left message on voicemail with name and callback number Johney Frame RN Navigator Cardiac Imaging [facility] [phone] Office

## 2023-02-14 ENCOUNTER — Ambulatory Visit (HOSPITAL_COMMUNITY)
Admission: RE | Admit: 2023-02-14 | Discharge: 2023-02-14 | Disposition: A | Discharge status: Home / Self Care | Payer: Medicare Other | Source: Ambulatory Visit | Attending: Internal Medicine | Admitting: Internal Medicine

## 2023-02-14 DIAGNOSIS — R0600 Dyspnea, unspecified: Secondary | ICD-10-CM | POA: Diagnosis not present

## 2023-02-14 DIAGNOSIS — R5383 Other fatigue: Secondary | ICD-10-CM | POA: Insufficient documentation

## 2023-02-14 DIAGNOSIS — I25810 Atherosclerosis of coronary artery bypass graft(s) without angina pectoris: Secondary | ICD-10-CM | POA: Insufficient documentation

## 2023-02-14 LAB — NM PET CT CARDIAC PERFUSION MULTI W/ABSOLUTE BLOODFLOW
LV dias vol: 91 mL (ref 62–150)
LV sys vol: 44 mL
Nuc Rest EF: 52 %
Nuc Stress EF: 44 %
Rest Nuclear Isotope Dose: 17.1 mCi
ST Depression (mm): 0 mm
Stress Nuclear Isotope Dose: 17 mCi
TID: 1.26

## 2023-02-14 MED ORDER — RUBIDIUM RB82 GENERATOR (RUBYFILL)
17.0600 | PACK | Freq: Once | INTRAVENOUS | Status: AC
  Administered 2023-02-14: 17.06 via INTRAVENOUS

## 2023-02-14 MED ORDER — REGADENOSON 0.4 MG/5ML IV SOLN
INTRAVENOUS | Status: AC
  Filled 2023-02-14: qty 5

## 2023-02-14 MED ORDER — RUBIDIUM RB82 GENERATOR (RUBYFILL)
11.0300 | PACK | Freq: Once | INTRAVENOUS | Status: AC
  Administered 2023-02-14: 17.03 via INTRAVENOUS

## 2023-02-14 MED ORDER — REGADENOSON 0.4 MG/5ML IV SOLN
0.4000 mg | Freq: Once | INTRAVENOUS | Status: AC
  Administered 2023-02-14: 0.4 mg via INTRAVENOUS

## 2023-02-16 ENCOUNTER — Ambulatory Visit: Payer: Medicare Other | Attending: Adult Health | Admitting: Adult Health

## 2023-02-16 ENCOUNTER — Encounter: Payer: Self-pay | Admitting: Adult Health

## 2023-02-16 ENCOUNTER — Ambulatory Visit: Payer: Medicare Other | Admitting: Cardiology

## 2023-02-16 VITALS — BP 130/62 | HR 63 | Ht 71.0 in | Wt 146.2 lb

## 2023-02-16 DIAGNOSIS — I25810 Atherosclerosis of coronary artery bypass graft(s) without angina pectoris: Secondary | ICD-10-CM

## 2023-02-16 NOTE — Patient Instructions (Signed)
Medication Instructions:  No changes *If you need a refill on your cardiac medications before your next appointment, please call your pharmacy*   Lab Work: BMET, CBC Today If you have labs (blood work) drawn today and your tests are completely normal, you will receive your results only by: MyChart Message (if you have MyChart) OR A paper copy in the mail If you have any lab test that is abnormal or we need to change your treatment, we will call you to review the results.   Testing/Procedures: No Testing   Follow-Up: At [facility], you and your health needs are our priority.  As part of our continuing mission to provide you with exceptional heart care, we have created designated Provider Care Teams.  These Care Teams include your primary Cardiologist (physician) and Advanced Practice Providers (APPs -  Physician Assistants and Nurse Practitioners) who all work together to provide you with the care you need, when you need it.  We recommend signing up for the patient portal called "MyChart".  Sign up information is provided on this After Visit Summary.  MyChart is used to connect with patients for Virtual Visits (Telemedicine).  Patients are able to view lab/test results, encounter notes, upcoming appointments, etc.  Non-urgent messages can be sent to your provider as well.   To learn more about what you can do with MyChart, go to [email or web address].    Your next appointment:   3 week(s)  Provider:   Joni Reining, DNP, ANP

## 2023-02-17 LAB — BASIC METABOLIC PANEL
BUN/Creatinine Ratio: 5 — ABNORMAL LOW (ref 10–24)
BUN: 39 mg/dL — ABNORMAL HIGH (ref 8–27)
CO2: 26 mmol/L (ref 20–29)
Calcium: 9 mg/dL (ref 8.6–10.2)
Chloride: 94 mmol/L — ABNORMAL LOW (ref 96–106)
Creatinine, Ser: 7.19 mg/dL — ABNORMAL HIGH (ref 0.76–1.27)
Glucose: 174 mg/dL — ABNORMAL HIGH (ref 70–99)
Potassium: 4.7 mmol/L (ref 3.5–5.2)
Sodium: 140 mmol/L (ref 134–144)
eGFR: 7 mL/min/{1.73_m2} — ABNORMAL LOW (ref >59)

## 2023-02-17 LAB — CBC
Hematocrit: 31.6 % — ABNORMAL LOW (ref 37.5–51.0)
Hemoglobin: 10.6 g/dL — ABNORMAL LOW (ref 13.0–17.7)
MCH: 33 pg (ref 26.6–33.0)
MCHC: 33.5 g/dL (ref 31.5–35.7)
MCV: 98 fL — ABNORMAL HIGH (ref 79–97)
Platelets: 253 10*3/uL (ref 150–450)
RBC: 3.21 x10E6/uL — ABNORMAL LOW (ref 4.14–5.80)
RDW: 14.7 % (ref 11.6–15.4)
WBC: 6.9 10*3/uL (ref 3.4–10.8)

## 2023-02-27 ENCOUNTER — Other Ambulatory Visit: Payer: Self-pay

## 2023-02-27 ENCOUNTER — Telehealth: Payer: Self-pay

## 2023-02-27 ENCOUNTER — Ambulatory Visit (HOSPITAL_COMMUNITY)
Admission: RE | Admit: 2023-02-27 | Discharge: 2023-02-27 | Disposition: A | Discharge status: Home / Self Care | Payer: Medicare Other | Attending: Cardiology | Admitting: Cardiology

## 2023-02-27 ENCOUNTER — Encounter (HOSPITAL_COMMUNITY)
Admission: RE | Disposition: A | Discharge status: Home / Self Care | Payer: Self-pay | Source: Home / Self Care | Attending: Cardiology

## 2023-02-27 DIAGNOSIS — M069 Rheumatoid arthritis, unspecified: Secondary | ICD-10-CM | POA: Insufficient documentation

## 2023-02-27 DIAGNOSIS — I2582 Chronic total occlusion of coronary artery: Secondary | ICD-10-CM | POA: Insufficient documentation

## 2023-02-27 DIAGNOSIS — I25718 Atherosclerosis of autologous vein coronary artery bypass graft(s) with other forms of angina pectoris: Secondary | ICD-10-CM | POA: Diagnosis not present

## 2023-02-27 DIAGNOSIS — E785 Hyperlipidemia, unspecified: Secondary | ICD-10-CM | POA: Diagnosis not present

## 2023-02-27 DIAGNOSIS — Z951 Presence of aortocoronary bypass graft: Secondary | ICD-10-CM | POA: Insufficient documentation

## 2023-02-27 DIAGNOSIS — I350 Nonrheumatic aortic (valve) stenosis: Secondary | ICD-10-CM | POA: Diagnosis not present

## 2023-02-27 DIAGNOSIS — I2584 Coronary atherosclerosis due to calcified coronary lesion: Secondary | ICD-10-CM | POA: Diagnosis not present

## 2023-02-27 DIAGNOSIS — Z992 Dependence on renal dialysis: Secondary | ICD-10-CM | POA: Insufficient documentation

## 2023-02-27 DIAGNOSIS — I5032 Chronic diastolic (congestive) heart failure: Secondary | ICD-10-CM | POA: Diagnosis not present

## 2023-02-27 DIAGNOSIS — N186 End stage renal disease: Secondary | ICD-10-CM | POA: Insufficient documentation

## 2023-02-27 DIAGNOSIS — E1151 Type 2 diabetes mellitus with diabetic peripheral angiopathy without gangrene: Secondary | ICD-10-CM | POA: Insufficient documentation

## 2023-02-27 DIAGNOSIS — Z79899 Other long term (current) drug therapy: Secondary | ICD-10-CM | POA: Diagnosis not present

## 2023-02-27 DIAGNOSIS — Z955 Presence of coronary angioplasty implant and graft: Secondary | ICD-10-CM | POA: Diagnosis not present

## 2023-02-27 DIAGNOSIS — E1122 Type 2 diabetes mellitus with diabetic chronic kidney disease: Secondary | ICD-10-CM | POA: Diagnosis not present

## 2023-02-27 DIAGNOSIS — I132 Hypertensive heart and chronic kidney disease with heart failure and with stage 5 chronic kidney disease, or end stage renal disease: Secondary | ICD-10-CM | POA: Diagnosis not present

## 2023-02-27 DIAGNOSIS — I25118 Atherosclerotic heart disease of native coronary artery with other forms of angina pectoris: Secondary | ICD-10-CM | POA: Diagnosis present

## 2023-02-27 DIAGNOSIS — I25810 Atherosclerosis of coronary artery bypass graft(s) without angina pectoris: Secondary | ICD-10-CM

## 2023-02-27 HISTORY — PX: LEFT HEART CATH AND CORS/GRAFTS ANGIOGRAPHY: CATH118250

## 2023-02-27 LAB — GLUCOSE, CAPILLARY
Glucose-Capillary: 91 mg/dL (ref 70–99)
Glucose-Capillary: 108 mg/dL — ABNORMAL HIGH (ref 70–99)

## 2023-02-27 SURGERY — LEFT HEART CATH AND CORS/GRAFTS ANGIOGRAPHY
Anesthesia: LOCAL

## 2023-02-27 MED ORDER — HYDRALAZINE HCL 20 MG/ML IJ SOLN: 10.0000 mg | INTRAMUSCULAR | Status: DC | PRN

## 2023-02-27 MED ORDER — FENTANYL CITRATE (PF) 100 MCG/2ML IJ SOLN
INTRAMUSCULAR | Status: DC | PRN
  Administered 2023-02-27: 25 ug via INTRAVENOUS

## 2023-02-27 MED ORDER — ASPIRIN 81 MG PO CHEW
CHEWABLE_TABLET | ORAL | Status: AC
  Filled 2023-02-27: qty 1

## 2023-02-27 MED ORDER — HYDRALAZINE HCL 20 MG/ML IJ SOLN
INTRAMUSCULAR | Status: DC | PRN
  Administered 2023-02-27: 20 mg via INTRAVENOUS

## 2023-02-27 MED ORDER — FENTANYL CITRATE (PF) 100 MCG/2ML IJ SOLN
INTRAMUSCULAR | Status: AC
  Filled 2023-02-27: qty 2

## 2023-02-27 MED ORDER — HEPARIN (PORCINE) IN NACL 1000-0.9 UT/500ML-% IV SOLN
INTRAVENOUS | Status: DC | PRN
  Administered 2023-02-27 (×2): 500 mL via INTRA_ARTERIAL

## 2023-02-27 MED ORDER — HYDRALAZINE HCL 20 MG/ML IJ SOLN
INTRAMUSCULAR | Status: AC
  Filled 2023-02-27: qty 1

## 2023-02-27 MED ORDER — SODIUM CHLORIDE 0.9 % IV SOLN: 250.0000 mL | INTRAVENOUS | Status: DC | PRN

## 2023-02-27 MED ORDER — LIDOCAINE HCL (PF) 1 % IJ SOLN
INTRAMUSCULAR | Status: AC
  Filled 2023-02-27: qty 30

## 2023-02-27 MED ORDER — ASPIRIN 81 MG PO CHEW
81.0000 mg | CHEWABLE_TABLET | Freq: Once | ORAL | Status: AC
  Administered 2023-02-27: 81 mg via ORAL

## 2023-02-27 MED ORDER — SODIUM CHLORIDE 0.9% FLUSH: 3.0000 mL | INTRAVENOUS | Status: DC | PRN

## 2023-02-27 MED ORDER — MIDAZOLAM HCL 2 MG/2ML IJ SOLN
INTRAMUSCULAR | Status: DC | PRN
  Administered 2023-02-27: 1 mg via INTRAVENOUS

## 2023-02-27 MED ORDER — IOHEXOL 350 MG/ML SOLN
INTRAVENOUS | Status: DC | PRN
  Administered 2023-02-27: 70 mL via INTRA_ARTERIAL

## 2023-02-27 MED ORDER — SODIUM CHLORIDE 0.9 % IV SOLN: INTRAVENOUS | Status: DC

## 2023-02-27 MED ORDER — ONDANSETRON HCL 4 MG/2ML IJ SOLN
4.0000 mg | Freq: Four times a day (QID) | INTRAMUSCULAR | Status: DC | PRN
Start: 2023-02-27 — End: 2023-02-27

## 2023-02-27 MED ORDER — SODIUM CHLORIDE 0.9% FLUSH: 3.0000 mL | Freq: Two times a day (BID) | INTRAVENOUS | Status: DC

## 2023-02-27 MED ORDER — ACETAMINOPHEN 325 MG PO TABS
650.0000 mg | ORAL_TABLET | ORAL | Status: DC | PRN
Start: 2023-02-27 — End: 2023-02-27

## 2023-02-27 MED ORDER — FENTANYL CITRATE (PF) 100 MCG/2ML IJ SOLN: 25.0000 ug | Freq: Once | INTRAMUSCULAR | Status: DC

## 2023-02-27 MED ORDER — LIDOCAINE HCL (PF) 1 % IJ SOLN
INTRAMUSCULAR | Status: DC | PRN
  Administered 2023-02-27: 15 mL

## 2023-02-27 MED ORDER — MIDAZOLAM HCL 2 MG/2ML IJ SOLN
INTRAMUSCULAR | Status: AC
  Filled 2023-02-27: qty 2

## 2023-02-27 SURGICAL SUPPLY — 13 items
CATH INFINITI 5 FR AR1 MOD (CATHETERS) IMPLANT
CATH INFINITI 5 FR IM (CATHETERS) IMPLANT
CATH INFINITI 5FR MULTPACK ANG (CATHETERS) IMPLANT
GUIDEWIRE INQWIRE 1.5J.035X260 (WIRE) IMPLANT
INQWIRE 1.5J .035X260CM (WIRE) ×1
KIT MICROPUNCTURE NIT STIFF (SHEATH) IMPLANT
PACK CARDIAC CATHETERIZATION (CUSTOM PROCEDURE TRAY) ×1 IMPLANT
PROTECTION STATION PRESSURIZED (MISCELLANEOUS) ×1
SET ATX-X65L (MISCELLANEOUS) IMPLANT
SHEATH PINNACLE 5F 10CM (SHEATH) IMPLANT
SHEATH PROBE COVER 6X72 (BAG) IMPLANT
STATION PROTECTION PRESSURIZED (MISCELLANEOUS) IMPLANT
WIRE EMERALD 3MM-J .035X150CM (WIRE) IMPLANT

## 2023-02-27 NOTE — Interval H&P Note (Signed)
 History and Physical Interval Note:  02/27/2023 10:20 AM  [name]  has presented today for surgery, with the diagnosis of abnormal pet scan with Atypical Angina & known CAD-CABG.    The various methods of treatment have been discussed with the patient and family. After consideration of risks, benefits and other options for treatment, the patient has consented to  Procedure(s): LEFT HEART CATH AND CORS/GRAFTS ANGIOGRAPHY (N/A)  PERCUTANEOUS CORONARY INTERVENTION   as a surgical intervention.  The patient's history has been reviewed, patient examined, no change in status, stable for surgery.  I have reviewed the patient's chart and labs.  Questions were answered to the patient's satisfaction.    Cath Lab Visit (complete for each Cath Lab visit)  Clinical Evaluation Leading to the Procedure:   ACS: No.  Non-ACS:    Anginal Classification: CCS II  Anti-ischemic medical therapy: Minimal Therapy (1 class of medications)  Non-Invasive Test Results: High-risk stress test findings: cardiac mortality >3%/year  Prior CABG: Previous CABG     Alm [place]

## 2023-02-27 NOTE — Telephone Encounter (Addendum)
 Called patient regarding results. Left message for patient regarding results. Letter mailed 13/31/24.----- Message from Lamarr Satterfield sent at 02/19/2023  8:18 AM EST ----- I have reviewed his labs. He is a dialysis patient and therefore kidney function is very decreased.  Cath lab has been made aware he is a dialysis patient in the orders and in my office note.  Okay to proceed with cath.

## 2023-02-27 NOTE — Progress Notes (Signed)
 SITE AREA: right groin  SITE PRIOR TO REMOVAL:  LEVEL 0  PRESSURE APPLIED FOR: approximately 25 minutes  MANUAL: yes  PATIENT STATUS DURING PULL: stable  POST PULL SITE:  LEVEL 0  POST PULL INSTRUCTIONS GIVEN: yes, drsg x 24 hours, no sitting in hot tubs, bath tubs, or pools x 1 week, take it easy on stairs and climbing into and out of vehicles, if any bleeding occurs call 911   POST PULL PULSES PRESENT: right post tibial and left pedal pulse present with doppler  DRESSING APPLIED: gauze with tegaderm  BEDREST BEGINS @ 1234  COMMENTS:

## 2023-02-27 NOTE — Discharge Instructions (Signed)
 Femoral Site Care This sheet gives you information about how to care for yourself after your procedure. Your health care provider may also give you more specific instructions. If you have problems or questions, contact your health care provider. What can I expect after the procedure?  After the procedure, it is common to have: Bruising that usually fades within 1-2 weeks. Tenderness at the site. Follow these instructions at home: Wound care Follow instructions from your health care provider about how to take care of your insertion site. Make sure you: Remove your dressing TOMORROW AT 1:00 PM Do not take baths, swim, or use a hot tub FOR 7 DAYS You may shower AFTER REMOVAL OF DRESSING ALLOW WATER  TO RUN OVER THE SITE. Pat the area dry with a clean towel. Do not rub the site. This may cause bleeding. Do not apply powder or lotion to the site. Keep the site clean and dry. Check your femoral site every day for signs of infection. Check for: Redness, swelling, or pain. Fluid or blood. Warmth. Pus or a bad smell. Activity For the first 2-3 days after your procedure, or as long as directed: Avoid climbing stairs as much as possible. Do not squat. Do not lift anything that is heavier than 10 lb (4.5 kg), or the limit that you are told, until your health care provider says that it is safe. For 5 days Rest as directed. Avoid sitting for a long time without moving. Get up to take short walks every 1-2 hours. Do not drive for 24 hours if you were given a medicine to help you relax (sedative). General instructions Take over-the-counter and prescription medicines only as told by your health care provider. Keep all follow-up visits as told by your health care provider. This is important. Contact a health care provider if you have: A fever or chills. You have redness, swelling, or pain around your insertion site. Get help right away if: The catheter insertion area swells very fast. Lay flat,  apply firm pressure to the site and CALL 911 You pass out. You suddenly start to sweat or your skin gets clammy. The catheter insertion area is bleeding, and the bleeding does not stop when you hold steady pressure on the area for 15 minutes.. The area near or just beyond the catheter insertion site becomes pale, cool, tingly, or numb. These symptoms may represent a serious problem that is an emergency. Do not wait to see if the symptoms will go away. Get medical help right away. Call your local emergency services (911 in the [place] Do not drive yourself to the hospital. Summary After the procedure, it is common to have bruising that usually fades within 1-2 weeks. Check your femoral site every day for signs of infection. Do not lift anything that is heavier than 10 lb (4.5 kg), or the limit that you are told, until your health care provider says that it is safe. This information is not intended to replace advice given to you by your health care provider. Make sure you discuss any questions you have with your health care provider. Document Revised: 02/26/2017 Document Reviewed: 02/26/2017 Elsevier Patient Education  2020 [employer].

## 2023-03-01 ENCOUNTER — Encounter (HOSPITAL_COMMUNITY): Payer: Self-pay | Admitting: Cardiology

## 2023-03-07 ENCOUNTER — Other Ambulatory Visit: Payer: Self-pay

## 2023-03-07 ENCOUNTER — Inpatient Hospital Stay (HOSPITAL_COMMUNITY)
Admission: EM | Admit: 2023-03-07 | Discharge: 2023-03-10 | DRG: 177 | Disposition: A | Discharge status: Home / Self Care | Payer: Medicare Other | Attending: Internal Medicine | Admitting: Internal Medicine

## 2023-03-07 ENCOUNTER — Encounter (HOSPITAL_COMMUNITY): Payer: Self-pay

## 2023-03-07 ENCOUNTER — Emergency Department (HOSPITAL_COMMUNITY): Payer: Medicare Other

## 2023-03-07 DIAGNOSIS — Z951 Presence of aortocoronary bypass graft: Secondary | ICD-10-CM

## 2023-03-07 DIAGNOSIS — E785 Hyperlipidemia, unspecified: Secondary | ICD-10-CM | POA: Diagnosis present

## 2023-03-07 DIAGNOSIS — G9341 Metabolic encephalopathy: Secondary | ICD-10-CM | POA: Diagnosis present

## 2023-03-07 DIAGNOSIS — I5032 Chronic diastolic (congestive) heart failure: Secondary | ICD-10-CM | POA: Diagnosis present

## 2023-03-07 DIAGNOSIS — Z7982 Long term (current) use of aspirin: Secondary | ICD-10-CM | POA: Diagnosis not present

## 2023-03-07 DIAGNOSIS — E1151 Type 2 diabetes mellitus with diabetic peripheral angiopathy without gangrene: Secondary | ICD-10-CM | POA: Diagnosis present

## 2023-03-07 DIAGNOSIS — Z992 Dependence on renal dialysis: Secondary | ICD-10-CM

## 2023-03-07 DIAGNOSIS — Z794 Long term (current) use of insulin: Secondary | ICD-10-CM | POA: Diagnosis not present

## 2023-03-07 DIAGNOSIS — R296 Repeated falls: Secondary | ICD-10-CM | POA: Diagnosis present

## 2023-03-07 DIAGNOSIS — Z961 Presence of intraocular lens: Secondary | ICD-10-CM | POA: Diagnosis present

## 2023-03-07 DIAGNOSIS — R410 Disorientation, unspecified: Secondary | ICD-10-CM

## 2023-03-07 DIAGNOSIS — Z91158 Patient's noncompliance with renal dialysis for other reason: Secondary | ICD-10-CM

## 2023-03-07 DIAGNOSIS — E1122 Type 2 diabetes mellitus with diabetic chronic kidney disease: Secondary | ICD-10-CM | POA: Diagnosis present

## 2023-03-07 DIAGNOSIS — Z87891 Personal history of nicotine dependence: Secondary | ICD-10-CM | POA: Diagnosis not present

## 2023-03-07 DIAGNOSIS — D631 Anemia in chronic kidney disease: Secondary | ICD-10-CM | POA: Diagnosis present

## 2023-03-07 DIAGNOSIS — I2510 Atherosclerotic heart disease of native coronary artery without angina pectoris: Secondary | ICD-10-CM | POA: Diagnosis present

## 2023-03-07 DIAGNOSIS — I10 Essential (primary) hypertension: Secondary | ICD-10-CM | POA: Diagnosis present

## 2023-03-07 DIAGNOSIS — Z66 Do not resuscitate: Secondary | ICD-10-CM | POA: Diagnosis present

## 2023-03-07 DIAGNOSIS — R4182 Altered mental status, unspecified: Secondary | ICD-10-CM | POA: Diagnosis present

## 2023-03-07 DIAGNOSIS — Z8249 Family history of ischemic heart disease and other diseases of the circulatory system: Secondary | ICD-10-CM | POA: Diagnosis not present

## 2023-03-07 DIAGNOSIS — Z1152 Encounter for screening for COVID-19: Secondary | ICD-10-CM | POA: Diagnosis not present

## 2023-03-07 DIAGNOSIS — I132 Hypertensive heart and chronic kidney disease with heart failure and with stage 5 chronic kidney disease, or end stage renal disease: Secondary | ICD-10-CM | POA: Diagnosis present

## 2023-03-07 DIAGNOSIS — J189 Pneumonia, unspecified organism: Secondary | ICD-10-CM

## 2023-03-07 DIAGNOSIS — E119 Type 2 diabetes mellitus without complications: Secondary | ICD-10-CM

## 2023-03-07 DIAGNOSIS — I060 Rheumatic aortic stenosis: Secondary | ICD-10-CM | POA: Diagnosis present

## 2023-03-07 DIAGNOSIS — K5900 Constipation, unspecified: Secondary | ICD-10-CM | POA: Diagnosis present

## 2023-03-07 DIAGNOSIS — A419 Sepsis, unspecified organism: Secondary | ICD-10-CM

## 2023-03-07 DIAGNOSIS — Z79899 Other long term (current) drug therapy: Secondary | ICD-10-CM | POA: Diagnosis not present

## 2023-03-07 DIAGNOSIS — N2581 Secondary hyperparathyroidism of renal origin: Secondary | ICD-10-CM | POA: Diagnosis present

## 2023-03-07 DIAGNOSIS — W19XXXA Unspecified fall, initial encounter: Principal | ICD-10-CM

## 2023-03-07 DIAGNOSIS — Z8546 Personal history of malignant neoplasm of prostate: Secondary | ICD-10-CM | POA: Diagnosis not present

## 2023-03-07 DIAGNOSIS — J690 Pneumonitis due to inhalation of food and vomit: Secondary | ICD-10-CM | POA: Diagnosis present

## 2023-03-07 DIAGNOSIS — I739 Peripheral vascular disease, unspecified: Secondary | ICD-10-CM | POA: Diagnosis present

## 2023-03-07 DIAGNOSIS — N186 End stage renal disease: Secondary | ICD-10-CM | POA: Diagnosis present

## 2023-03-07 DIAGNOSIS — Z9841 Cataract extraction status, right eye: Secondary | ICD-10-CM

## 2023-03-07 DIAGNOSIS — Z9842 Cataract extraction status, left eye: Secondary | ICD-10-CM

## 2023-03-07 DIAGNOSIS — Z888 Allergy status to other drugs, medicaments and biological substances status: Secondary | ICD-10-CM

## 2023-03-07 HISTORY — DX: Altered mental status, unspecified: R41.82

## 2023-03-07 LAB — URINALYSIS, W/ REFLEX TO CULTURE (INFECTION SUSPECTED)
Bacteria, UA: NONE SEEN
Bilirubin Urine: NEGATIVE
Glucose, UA: NEGATIVE mg/dL
Ketones, ur: NEGATIVE mg/dL
Leukocytes,Ua: NEGATIVE
Nitrite: NEGATIVE
Protein, ur: 100 mg/dL — AB
Specific Gravity, Urine: 1.012 (ref 1.005–1.030)
pH: 8 (ref 5.0–8.0)

## 2023-03-07 LAB — HEPATITIS B SURFACE ANTIGEN: Hepatitis B Surface Ag: NONREACTIVE

## 2023-03-07 LAB — CBC WITH DIFFERENTIAL/PLATELET
Abs Immature Granulocytes: 0.06 10*3/uL (ref 0.00–0.07)
Basophils Absolute: 0 10*3/uL (ref 0.0–0.1)
Basophils Relative: 0 %
Eosinophils Absolute: 0 10*3/uL (ref 0.0–0.5)
Eosinophils Relative: 0 %
HCT: 29.2 % — ABNORMAL LOW (ref 39.0–52.0)
Hemoglobin: 9.5 g/dL — ABNORMAL LOW (ref 13.0–17.0)
Immature Granulocytes: 1 %
Lymphocytes Relative: 6 %
Lymphs Abs: 0.6 10*3/uL — ABNORMAL LOW (ref 0.7–4.0)
MCH: 33.1 pg (ref 26.0–34.0)
MCHC: 32.5 g/dL (ref 30.0–36.0)
MCV: 101.7 fL — ABNORMAL HIGH (ref 80.0–100.0)
Monocytes Absolute: 0.8 10*3/uL (ref 0.1–1.0)
Monocytes Relative: 9 %
Neutro Abs: 8 10*3/uL — ABNORMAL HIGH (ref 1.7–7.7)
Neutrophils Relative %: 84 %
Platelets: 219 10*3/uL (ref 150–400)
RBC: 2.87 MIL/uL — ABNORMAL LOW (ref 4.22–5.81)
RDW: 16 % — ABNORMAL HIGH (ref 11.5–15.5)
Smear Review: NORMAL
WBC: 9.5 10*3/uL (ref 4.0–10.5)
nRBC: 0 % (ref 0.0–0.2)

## 2023-03-07 LAB — RESP PANEL BY RT-PCR (RSV, FLU A&B, COVID)  RVPGX2
Influenza A by PCR: NEGATIVE
Influenza B by PCR: NEGATIVE
Resp Syncytial Virus by PCR: NEGATIVE
SARS Coronavirus 2 by RT PCR: NEGATIVE

## 2023-03-07 LAB — CBC
HCT: 29.8 % — ABNORMAL LOW (ref 39.0–52.0)
Hemoglobin: 9.6 g/dL — ABNORMAL LOW (ref 13.0–17.0)
MCH: 33.4 pg (ref 26.0–34.0)
MCHC: 32.2 g/dL (ref 30.0–36.0)
MCV: 103.8 fL — ABNORMAL HIGH (ref 80.0–100.0)
Platelets: 209 10*3/uL (ref 150–400)
RBC: 2.87 MIL/uL — ABNORMAL LOW (ref 4.22–5.81)
RDW: 16.3 % — ABNORMAL HIGH (ref 11.5–15.5)
WBC: 10.4 10*3/uL (ref 4.0–10.5)
nRBC: 0 % (ref 0.0–0.2)

## 2023-03-07 LAB — COMPREHENSIVE METABOLIC PANEL
ALT: 19 U/L (ref 0–44)
AST: 27 U/L (ref 15–41)
Albumin: 2.7 g/dL — ABNORMAL LOW (ref 3.5–5.0)
Alkaline Phosphatase: 50 U/L (ref 38–126)
Anion gap: 19 — ABNORMAL HIGH (ref 5–15)
BUN: 92 mg/dL — ABNORMAL HIGH (ref 8–23)
CO2: 25 mmol/L (ref 22–32)
Calcium: 9 mg/dL (ref 8.9–10.3)
Chloride: 96 mmol/L — ABNORMAL LOW (ref 98–111)
Creatinine, Ser: 11.55 mg/dL — ABNORMAL HIGH (ref 0.61–1.24)
GFR, Estimated: 4 mL/min — ABNORMAL LOW (ref >60)
Glucose, Bld: 158 mg/dL — ABNORMAL HIGH (ref 70–99)
Potassium: 5.1 mmol/L (ref 3.5–5.1)
Sodium: 140 mmol/L (ref 135–145)
Total Bilirubin: 0.7 mg/dL (ref 0.0–1.2)
Total Protein: 6 g/dL — ABNORMAL LOW (ref 6.5–8.1)

## 2023-03-07 LAB — PROTIME-INR
INR: 1.2 (ref 0.8–1.2)
Prothrombin Time: 15.6 s — ABNORMAL HIGH (ref 11.4–15.2)

## 2023-03-07 LAB — CREATININE, SERUM
Creatinine, Ser: 11.93 mg/dL — ABNORMAL HIGH (ref 0.61–1.24)
GFR, Estimated: 4 mL/min — ABNORMAL LOW (ref >60)

## 2023-03-07 LAB — HEMOGLOBIN A1C
Hgb A1c MFr Bld: 6.1 % — ABNORMAL HIGH (ref 4.8–5.6)
Mean Plasma Glucose: 128.37 mg/dL

## 2023-03-07 LAB — GLUCOSE, CAPILLARY: Glucose-Capillary: 191 mg/dL — ABNORMAL HIGH (ref 70–99)

## 2023-03-07 LAB — CBG MONITORING, ED: Glucose-Capillary: 106 mg/dL — ABNORMAL HIGH (ref 70–99)

## 2023-03-07 LAB — APTT: aPTT: 33 s (ref 24–36)

## 2023-03-07 LAB — LACTIC ACID, PLASMA: Lactic Acid, Venous: 1.3 mmol/L (ref 0.5–1.9)

## 2023-03-07 MED ORDER — HEPARIN SODIUM (PORCINE) 5000 UNIT/ML IJ SOLN
5000.0000 [IU] | Freq: Three times a day (TID) | INTRAMUSCULAR | Status: DC
  Administered 2023-03-08 – 2023-03-10 (×6): 5000 [IU] via SUBCUTANEOUS
  Filled 2023-03-07 (×6): qty 1

## 2023-03-07 MED ORDER — NEPRO/CARBSTEADY PO LIQD: 237.0000 mL | ORAL | Status: DC | PRN

## 2023-03-07 MED ORDER — LIDOCAINE-PRILOCAINE 2.5-2.5 % EX CREA: 1.0000 | TOPICAL_CREAM | CUTANEOUS | Status: DC | PRN

## 2023-03-07 MED ORDER — ATORVASTATIN CALCIUM 80 MG PO TABS
80.0000 mg | ORAL_TABLET | Freq: Every day | ORAL | Status: DC
  Administered 2023-03-07 – 2023-03-10 (×4): 80 mg via ORAL
  Filled 2023-03-07 (×2): qty 1
  Filled 2023-03-07: qty 2
  Filled 2023-03-07: qty 1

## 2023-03-07 MED ORDER — PANTOPRAZOLE SODIUM 40 MG IV SOLR
40.0000 mg | INTRAVENOUS | Status: DC
  Administered 2023-03-07 – 2023-03-08 (×2): 40 mg via INTRAVENOUS
  Filled 2023-03-07 (×2): qty 10

## 2023-03-07 MED ORDER — CHLORHEXIDINE GLUCONATE CLOTH 2 % EX PADS
6.0000 | MEDICATED_PAD | Freq: Every day | CUTANEOUS | Status: DC
  Administered 2023-03-08 – 2023-03-10 (×3): 6 via TOPICAL

## 2023-03-07 MED ORDER — IOHEXOL 350 MG/ML SOLN
65.0000 mL | Freq: Once | INTRAVENOUS | Status: AC | PRN
  Administered 2023-03-07: 65 mL via INTRAVENOUS

## 2023-03-07 MED ORDER — POLYVINYL ALCOHOL 1.4 % OP SOLN
1.0000 [drp] | Freq: Every day | OPHTHALMIC | Status: DC
  Administered 2023-03-07 – 2023-03-10 (×4): 1 [drp] via OPHTHALMIC
  Filled 2023-03-07: qty 15

## 2023-03-07 MED ORDER — ACETAMINOPHEN 650 MG RE SUPP: 650.0000 mg | Freq: Four times a day (QID) | RECTAL | Status: DC | PRN

## 2023-03-07 MED ORDER — PROSOURCE PLUS PO LIQD
30.0000 mL | ORAL | Status: DC
  Administered 2023-03-08 – 2023-03-10 (×2): 30 mL via ORAL
  Filled 2023-03-07 (×2): qty 30

## 2023-03-07 MED ORDER — SENNOSIDES-DOCUSATE SODIUM 8.6-50 MG PO TABS
2.0000 | ORAL_TABLET | Freq: Two times a day (BID) | ORAL | Status: DC
  Administered 2023-03-07 – 2023-03-10 (×7): 2 via ORAL
  Filled 2023-03-07 (×7): qty 2

## 2023-03-07 MED ORDER — SODIUM CHLORIDE 0.9 % IV SOLN
2.0000 g | Freq: Once | INTRAVENOUS | Status: AC
  Administered 2023-03-07: 2 g via INTRAVENOUS
  Filled 2023-03-07: qty 20

## 2023-03-07 MED ORDER — SODIUM CHLORIDE 0.9 % IV SOLN
500.0000 mg | INTRAVENOUS | Status: DC
  Administered 2023-03-08: 500 mg via INTRAVENOUS
  Filled 2023-03-07 (×2): qty 5

## 2023-03-07 MED ORDER — ALTEPLASE 2 MG IJ SOLR: 2.0000 mg | Freq: Once | INTRAMUSCULAR | Status: DC | PRN

## 2023-03-07 MED ORDER — SODIUM CHLORIDE 0.9% FLUSH
3.0000 mL | Freq: Two times a day (BID) | INTRAVENOUS | Status: DC
  Administered 2023-03-08 – 2023-03-10 (×6): 3 mL via INTRAVENOUS

## 2023-03-07 MED ORDER — ANTICOAGULANT SODIUM CITRATE 4% (200MG/5ML) IV SOLN: 5.0000 mL | Status: DC | PRN

## 2023-03-07 MED ORDER — PENTAFLUOROPROP-TETRAFLUOROETH EX AERO: 1.0000 | INHALATION_SPRAY | CUTANEOUS | Status: DC | PRN

## 2023-03-07 MED ORDER — ACETAMINOPHEN 325 MG PO TABS
650.0000 mg | ORAL_TABLET | Freq: Four times a day (QID) | ORAL | Status: DC | PRN
  Administered 2023-03-08: 650 mg via ORAL
  Filled 2023-03-07: qty 2

## 2023-03-07 MED ORDER — SODIUM CHLORIDE 0.9 % IV BOLUS
1000.0000 mL | Freq: Once | INTRAVENOUS | Status: AC
  Administered 2023-03-07: 1000 mL via INTRAVENOUS

## 2023-03-07 MED ORDER — MELATONIN 1 MG PO TABS
1.0000 mg | ORAL_TABLET | Freq: Every evening | ORAL | Status: DC | PRN
Start: 2023-03-07 — End: 2023-03-10

## 2023-03-07 MED ORDER — HEPARIN SODIUM (PORCINE) 1000 UNIT/ML DIALYSIS
1000.0000 [IU] | INTRAMUSCULAR | Status: DC | PRN
  Administered 2023-03-08: 3200 [IU]

## 2023-03-07 MED ORDER — ASPIRIN 81 MG PO TBEC
81.0000 mg | DELAYED_RELEASE_TABLET | Freq: Every day | ORAL | Status: DC
  Administered 2023-03-07 – 2023-03-10 (×4): 81 mg via ORAL
  Filled 2023-03-07 (×4): qty 1

## 2023-03-07 MED ORDER — POLYETHYLENE GLYCOL 3350 17 G PO PACK: 17.0000 g | PACK | Freq: Every day | ORAL | Status: DC | PRN

## 2023-03-07 MED ORDER — AMPICILLIN-SULBACTAM SODIUM 3 (2-1) G IJ SOLR
3.0000 g | INTRAMUSCULAR | Status: DC
  Administered 2023-03-07: 3 g via INTRAVENOUS
  Filled 2023-03-07: qty 8

## 2023-03-07 MED ORDER — INSULIN ASPART 100 UNIT/ML IJ SOLN
0.0000 [IU] | Freq: Every day | INTRAMUSCULAR | Status: DC
Start: 2023-03-07 — End: 2023-03-10

## 2023-03-07 MED ORDER — LIDOCAINE HCL (PF) 1 % IJ SOLN: 5.0000 mL | INTRAMUSCULAR | Status: DC | PRN

## 2023-03-07 MED ORDER — SODIUM CHLORIDE 0.9 % IV SOLN
500.0000 mg | Freq: Once | INTRAVENOUS | Status: AC
  Administered 2023-03-07: 500 mg via INTRAVENOUS
  Filled 2023-03-07: qty 5

## 2023-03-07 MED ORDER — SEVELAMER CARBONATE 800 MG PO TABS
2400.0000 mg | ORAL_TABLET | Freq: Two times a day (BID) | ORAL | Status: DC
  Administered 2023-03-07 – 2023-03-10 (×7): 2400 mg via ORAL
  Filled 2023-03-07 (×7): qty 3

## 2023-03-07 MED ORDER — INSULIN ASPART 100 UNIT/ML IJ SOLN
0.0000 [IU] | Freq: Three times a day (TID) | INTRAMUSCULAR | Status: DC
  Administered 2023-03-08 – 2023-03-09 (×2): 1 [IU] via SUBCUTANEOUS
  Administered 2023-03-09: 2 [IU] via SUBCUTANEOUS
  Administered 2023-03-09: 1 [IU] via SUBCUTANEOUS

## 2023-03-07 MED ORDER — PROPYLENE GLYCOL (PF) 0.6 % OP SOLN: 1.0000 [drp] | Freq: Every day | OPHTHALMIC | Status: DC

## 2023-03-07 NOTE — Consult Note (Signed)
 Renal Service Consult Note [facility]  [name] 03/07/2023 Nathaniel JONETTA Fret, MD Requesting Physician: Dr. Moody  Reason for Consult: ESRD pt w/ AMS, fatigue HPI: The patient is a 89 y.o. year-old w/ PMH as below who presented to ED this am w/ AMS. Has been sick for about 3 days per the wife. He missed HD yesterday. Has been having subjective fevers, some abd pain. In ED temp 100.4, pt disoriented which is new. CXR showed R infiltration. Pt started on IV abx for PNA. Pt was admitted. We are asked to see for dialysis.   Pt seen in ED.  Pt is not responding much to regular voice. Wife at bedside.  Hx as above. Regularly takes care of himself, even drove himself to dialysis recently a couple times. On HD x 3 yrs.   ROS - denies CP, no joint pain, no HA, no blurry vision, no rash, no diarrhea, no nausea/ vomiting, no dysuria, no difficulty voiding   Past Medical History  Past Medical History:  Diagnosis Date   Acute coronary syndrome (HCC) 04/18/2019   Aortic stenosis 02/27/2020   mild to moderate AS   Arthritis    a little in LLE (06/20/2017)   Ataxia    Chronic diastolic CHF (congestive heart failure) (HCC)    Coronary artery disease    a. s/p CABG 1980s. b. stable by cath 05/2017.   Dizziness    Dyslipidemia    Edema 08/08/2017   HANDS & LOWER EXTREMITES   ESRD on hemodialysis (HCC)    Hypertension    Hypoalbuminemia    Nephrotic range proteinuria    Peripheral artery disease (HCC)    mild   Pneumonia ~ 1950 X 1   Prostate cancer (HCC) ~ 2015   Type II diabetes mellitus (HCC)    Past Surgical History  Past Surgical History:  Procedure Laterality Date   CAPD REVISION N/A 06/29/2022   Procedure: REPLACEMENT OF PERITONEAL DIALYSIS CATHETER;  Surgeon: Magda Debby SAILOR, MD;  Location: [facility] OR;  Service: Vascular;  Laterality: N/A;   CARDIAC CATHETERIZATION     a couple times (06/20/2017)   CATARACT EXTRACTION W/ INTRAOCULAR LENS  IMPLANT, BILATERAL  Bilateral    COLONOSCOPY     CORONARY ARTERY BYPASS GRAFT  03/06/1985   LAD and first diagonal/circumflex (sequential saphenous vein graft,cardioplegia   INGUINAL HERNIA REPAIR Right 12/06/2020   Procedure: OPEN RIGHT INGUINAL HERNIA REPAIR;  Surgeon: Gladis Cough, MD;  Location: [facility];  Service: General;  Laterality: Right;   INSERTION PROSTATE RADIATION SEED  ~ 2015   IR FLUORO GUIDE CV LINE RIGHT  06/26/2022   IR US  GUIDE VASC ACCESS RIGHT  06/26/2022   LAPAROSCOPIC LYSIS OF ADHESIONS N/A 06/29/2022   Procedure: LAPAROSCOPIC LYSIS OF ADHESIONS;  Surgeon: Magda Debby SAILOR, MD;  Location: [facility] OR;  Service: Vascular;  Laterality: N/A;   LAPAROSCOPY N/A 06/29/2022   Procedure: LAPAROSCOPY DIAGNOSTIC;  Surgeon: Magda Debby SAILOR, MD;  Location: [facility] OR;  Service: Vascular;  Laterality: N/A;   LEFT HEART CATH AND CORS/GRAFTS ANGIOGRAPHY N/A 02/27/2023   Procedure: LEFT HEART CATH AND CORS/GRAFTS ANGIOGRAPHY;  Surgeon: Anner Alm ORN, MD;  Location: [facility] IN[facility]ASIVE [facility];  Service: Cardiovascular;  Laterality: N/A;   NM MYOCAR PERF WALL MOTION  09/12/2006   no ischemia   REMOVAL OF A DIALYSIS CATHETER N/A 07/04/2022   Procedure: PERITONEAL CATHETER REMOVAL;  Surgeon: Lanis Fonda BRAVO, MD;  Location: [facility] OR;  Service: Vascular;  Laterality: N/A;  RIGHT/LEFT HEART CATH AND CORONARY/GRAFT ANGIOGRAPHY N/A 06/21/2017   Procedure: RIGHT/LEFT HEART CATH AND CORONARY/GRAFT ANGIOGRAPHY;  Surgeon: Verlin Lonni BIRCH, MD;  Location: [facility] INVAS[facility] CV [facility];  Service: Cardiovascular;  Laterality: N/A;   US  ECHOCARDIOGRAPHY  11/04/2008   trace TR & MR   Family History  Family History  Problem Relation Age of Onset   Heart attack Mother    Social History  reports that he quit smoking about 50 years ago. His smoking use included cigarettes. He started smoking about 70 years ago. He has a 20 pack-year smoking history. He has never used smokeless tobacco. He reports that he does not currently use alcohol . He reports  that he does not use drugs. Allergies  Allergies  Allergen Reactions   Clonidine  Hcl     Other reaction(s): constipation   Home medications Prior to Admission medications   Medication Sig Start Date End Date Taking? Authorizing Provider  Alpha-D-Galactosidase (BEANO PO) Take 1 tablet by mouth daily as needed (Gas).   Yes [provider]  aspirin  EC 81 MG tablet Take 81 mg by mouth daily.   Yes [provider]  atorvastatin  (LIPITOR ) 80 MG tablet TAKE 1 TABLET BY MOUTH EVERY DAY 09/14/22  Yes Hilty, Vinie BROCKS, MD  carvedilol  (COREG ) 3.125 MG tablet TAKE 1 TABLET BY MOUTH TWICE A DAY WITH FOOD 10/03/22  Yes Hilty, Vinie BROCKS, MD  Cyanocobalamin (VITAMIN B-12) 5000 MCG TBDP Take 5,000 Units by mouth daily.   Yes [provider]  diphenhydramine -acetaminophen  (TYLENOL  PM) 25-500 MG TABS tablet Take 1 tablet by mouth at bedtime as needed (Sleep).   Yes [provider]  Emollient (GOLD BOND ULTIMATE HEALING) CREA Apply 1 Application topically daily as needed (Moisturizer).   Yes [provider]  famotidine  (PEPCID ) 10 MG tablet Take 10 mg by mouth daily as needed for heartburn or indigestion.   Yes [provider]  fexofenadine (ALLEGRA) 180 MG tablet Take 180 mg by mouth daily as needed for allergies or rhinitis.   Yes [provider]  folic acid  (FOLVITE ) 800 MCG tablet Take 400 mcg by mouth daily.   Yes [provider]  furosemide  (LASIX ) 80 MG tablet Take 1 tablet (80 mg total) by mouth every Monday, Wednesday, and Friday. Patient taking differently: Take 80 mg by mouth 2 (two) times daily. 07/05/22  Yes Tobie Yetta HERO, MD  gentamicin  cream (GARAMYCIN ) 0.1 % Apply 1 Application topically daily as needed (Rash). 04/21/21  Yes [provider]  Hydrocortisone Acetate 1 % CREA Apply 1 application  topically daily as needed (Itching).   Yes [provider]  insulin  glargine (LANTUS ) 100 UNIT/ML injection Inject 10  Units into the skin daily as needed (high blood glucose). 10/29/19  Yes [provider]  melatonin 1 MG TABS tablet Take 1 mg by mouth at bedtime as needed (Sleep).   Yes [provider]  nitroGLYCERIN  (NITROSTAT ) 0.4 MG SL tablet Place 1 tablet (0.4 mg total) under the tongue every 5 (five) minutes x 3 doses as needed for chest pain. 04/28/19  Yes Hilty, Vinie BROCKS, MD  Nutritional Supplements (,FEEDING SUPPLEMENT, PROSOURCE PLUS) liquid Take 30 mLs by mouth 2 (two) times daily between meals. Patient taking differently: Take 30 mLs by mouth every other day. premier protein 07/04/22  Yes Tobie Yetta HERO, MD  OVER THE COUNTER MEDICATION Place 1 spray into both nostrils daily as needed (Congestion). mucinex saline spray   Yes [provider]  polyethylene glycol (MIRALAX  /  GLYCOLAX ) 17 g packet Take 17 g by mouth daily as needed for mild constipation. 07/04/22  Yes Tobie Yetta HERO, MD  Propylene Glycol, PF, (SYSTANE COMPLETE PF) 0.6 % SOLN Place 1 drop into both eyes daily.   Yes [provider]  sevelamer  carbonate (RENVELA ) 800 MG tablet Take 2,400 mg by mouth 2 (two) times daily. 11/09/21  Yes [provider]     Vitals:   03/07/23 0345 03/07/23 0352 03/07/23 0657 03/07/23 1222  BP:  (!) 115/54 (!) 108/54 127/66  Pulse:  91 83 66  Resp:  20 (!) 21 16  Temp:  (!) 100.4 F (38 C) 99.4 F (37.4 C) 97.7 F (36.5 C)  TempSrc:  Oral Oral Oral  SpO2:  90% 95% 98%  Weight: 64.4 kg      Exam Gen eyes closed, follows simple commands like take a deep breath, not responding verbally No rash, cyanosis or gangrene Sclera anicteric, throat clear  No jvd or bruits Chest clear bilat to bases, no rales/ wheezing RRR no MRG Abd soft ntnd no mass or ascites +bs GU nl male MS no joint effusions or deformity Ext no LE or UE edema, no wounds or ulcers Neuro is alert, Ox 3 , nf    RIJ TDC intact      Renal-related home meds: - coreg  3.125 bid - renvela  3 ac  tid - others: statin, asa, PPI, lasix , insulin , sl ntg    OP HD: [facility]   3.5h   400/800   67kg  3K/ 2.5 Ca bath   TDC RIJ  Heparin  none  - coming off 2 kg over last 2 wks - mircera 75 q 4 wks, last 12/14, due 1/11 - rocaltrol 0.75 mcg - sensipar 60mg     Assessment/ Plan: CAP - w/ RLL infiltration by CT, poss aspiration pna. Getting IV abx per pmd.  AMS - possibly due to infection and/or missing HD yesterday. Plan HD tonight or tomorrow am at the latest.  ESRD - on HD TTS. Missed HD x 1. HD tonight or tomorrow.  BP - bp's are low to low normal. No need for bp lowering meds  Volume - no gross edema excess  Anemia of esrd - Hb 9-10 here. Esa due on 1/11. Follow.  Secondary hyperparathyroidism - renvela  3 ac, CCa in range.      Myer Fret  MD [facility] 03/07/2023, 3:08 PM  Recent Labs  Lab 03/07/23 0427 03/07/23 0745  HGB 9.5*  --   ALBUMIN  --  2.7*  CALCIUM   --  9.0  CREATININE  --  11.55*  K  --  5.1   Inpatient medications:  [START ON 03/08/2023] (feeding supplement) PROSource Plus  30 mL Oral QODAY   aspirin  EC  81 mg Oral Daily   atorvastatin   80 mg Oral Daily   [START ON 03/08/2023] heparin   5,000 Units Subcutaneous Q8H   insulin  aspart  0-5 Units Subcutaneous QHS   insulin  aspart  0-9 Units Subcutaneous TID WC   pantoprazole  (PROTONIX ) IV  40 mg Intravenous Q24H   Propylene Glycol (PF)  1 drop Both Eyes Daily   senna-docusate  2 tablet Oral BID   sevelamer  carbonate  2,400 mg Oral BID   sodium chloride  flush  3 mL Intravenous Q12H    ampicillin -sulbactam (UNASYN ) IV     [START ON 03/08/2023] azithromycin  (ZITHROMAX ) 500 mg in sodium chloride  0.9 % 250 mL IVPB     acetaminophen  **OR** acetaminophen , melatonin, polyethylene  glycol

## 2023-03-07 NOTE — Assessment & Plan Note (Signed)
 Patient diastolic blood pressure less than 60 mmHg at presentation.  Given acute illness, at this time I will hold the Coreg.  Please restart once patient's blood pressure improves.

## 2023-03-07 NOTE — Assessment & Plan Note (Addendum)
 Right more than left lobe with CAT scan demonstrating occlusion of some of the small airways.  Concerning for aspiration.  S/p ceftriaxone  and azithromycin  in the ER.  S/p blood cultures.  I will treat the patient with Unasyn  and azithromycin  going forward.  Patient not hypoxic at this time.  Monitor pulse ox. Swallow eval pending.

## 2023-03-07 NOTE — Sepsis Progress Note (Signed)
 Elink monitoring for the code sepsis protocol.

## 2023-03-07 NOTE — H&P (Signed)
 History and Physical    [name] FMW:[id number] DOB: [birth year] DOA: 03/07/2023 DOS: the patient was seen and examined on 03/07/2023 PCP: Corlis Pagan, NP  Patient coming from: Home  Chief Complaint:  Chief Complaint  Patient presents with   Fall   HPI: [name] is a 89 y.o. male with medical history significant of end-stage renal disease.  Patient is on scheduled dialysis Tuesday Thursday and Saturday.  Patient's last dialysis was Saturday about 4 days ago.  That is when patient seems to have been in his last usual state of health.  Patient does have some chronic cognitive deficit.  However patient is generally aware and able to hold a reasonable conversation.  Patient's wife, who is at the bedside and the principal historian today, generally manages patient's financial affairs and complex transections.  Patient started having generalized fatigue about 3 days ago.  Patient would notice orchestra playing in the house and pointing it out to the wife.  Patient did not report any headache, no report of vision changes.  No focal weakness.  However patient has had 3 falls at home since his symptoms started.  Patient was able to be helped by wife in the first fall.  Patient sought some outside of the home help on the second time.  The third fall was late last evening.  Eventually EMS was called and patient brought to [facility] ER approximately 2 AM this morning.  As per further report obtained from the patient's wife, patient did not have any vomiting or diarrhea or rash on his skin, did not report any headache.  Patient seems to have reported some abdominal pain to EMS providers.  There is report of subjective fevers however no documented fevers at home.  ER workup is notable for patient having a Tmax of 100.4.  Patient being found to be not oriented to date and time.  Which is felt to be new.  Chest x-ray finding for right lung infiltration.  Diagnosis of aspiration pneumonia is made.   Patient is s/p ceftriaxone  and azithromycin .  Medical evaluation is sought.  Patiet was prety awake and arlet at the time of initial ER eval per report and as per wife. However , is not preferring to sleep due to inability to sleep last night.  Review of Systems: unable to review all systems due to the inability of the patient to answer questions. Past Medical History:  Diagnosis Date   Acute coronary syndrome (HCC) 04/18/2019   Aortic stenosis 02/27/2020   mild to moderate AS   Arthritis    a little in LLE (06/20/2017)   Ataxia    Chronic diastolic CHF (congestive heart failure) (HCC)    Coronary artery disease    a. s/p CABG 1980s. b. stable by cath 05/2017.   Dizziness    Dyslipidemia    Edema 08/08/2017   HANDS & LOWER EXTREMITES   ESRD on hemodialysis (HCC)    Hypertension    Hypoalbuminemia    Nephrotic range proteinuria    Peripheral artery disease (HCC)    mild   Pneumonia ~ 1950 X 1   Prostate cancer (HCC) ~ 2015   Type II diabetes mellitus (HCC)    Past Surgical History:  Procedure Laterality Date   CAPD REVISION N/A 06/29/2022   Procedure: REPLACEMENT OF PERITONEAL DIALYSIS CATHETER;  Surgeon: Magda Debby SAILOR, MD;  Location: [facility] OR;  Service: Vascular;  Laterality: N/A;   CARDIAC CATHETERIZATION     a couple times (  06/20/2017)   CATARACT EXTRACTION W/ INTRAOCULAR LENS  IMPLANT, BILATERAL Bilateral    COLONOSCOPY     CORONARY ARTERY BYPASS GRAFT  03/06/1985   LAD and first diagonal/circumflex (sequential saphenous vein graft,cardioplegia   INGUINAL HERNIA REPAIR Right 12/06/2020   Procedure: OPEN RIGHT INGUINAL HERNIA REPAIR;  Surgeon: Gladis Cough, MD;  Location: [facility] ORS;  Service: General;  Laterality: Right;   INSERTION PROSTATE RADIATION SEED  ~ 2015   IR FLUORO GUIDE CV LINE RIGHT  06/26/2022   IR US  GUIDE VASC ACCESS RIGHT  06/26/2022   LAPAROSCOPIC LYSIS OF ADHESIONS N/A 06/29/2022   Procedure: LAPAROSCOPIC LYSIS OF ADHESIONS;  Surgeon: Magda Debby SAILOR,  MD;  Location: [facility] OR;  Service: Vascular;  Laterality: N/A;   LAPAROSCOPY N/A 06/29/2022   Procedure: LAPAROSCOPY DIAGNOSTIC;  Surgeon: Magda Debby SAILOR, MD;  Location: [facility] OR;  Service: Vascular;  Laterality: N/A;   LEFT HEART CATH AND CORS/GRAFTS ANGIOGRAPHY N/A 02/27/2023   Procedure: LEFT HEART CATH AND CORS/GRAFTS ANGIOGRAPHY;  Surgeon: Anner Alm ORN, MD;  Location: [facility] INVASIVE CV LAB;  Service: Cardiovascular;  Laterality: N/A;   NM MYOCAR PERF WALL MOTION  09/12/2006   no ischemia   REMOVAL OF A DIALYSIS CATHETER N/A 07/04/2022   Procedure: PERITONEAL CATHETER REMOVAL;  Surgeon: Lanis Fonda BRAVO, MD;  Location: [facility] OR;  Service: Vascular;  Laterality: N/A;   RIGHT/LEFT HEART CATH AND CORONARY/GRAFT ANGIOGRAPHY N/A 06/21/2017   Procedure: RIGHT/LEFT HEART CATH AND CORONARY/GRAFT ANGIOGRAPHY;  Surgeon: Verlin Lonni BIRCH, MD;  Location: [facility] INVASIVE CV LAB;  Service: Cardiovascular;  Laterality: N/A;   US  ECHOCARDIOGRAPHY  11/04/2008   trace TR & MR   Social History:  reports that he quit smoking about 50 years ago. His smoking use included cigarettes. He started smoking about 70 years ago. He has a 20 pack-year smoking history. He has never used smokeless tobacco. He reports that he does not currently use alcohol . He reports that he does not use drugs.  Allergies  Allergen Reactions   Clonidine  Hcl     Other reaction(s): constipation    Family History  Problem Relation Age of Onset   Heart attack Mother     Prior to Admission medications   Medication Sig Start Date End Date Taking? Authorizing Provider  Alpha-D-Galactosidase (BEANO PO) Take 1 tablet by mouth daily as needed (Gas).   Yes [provider]  aspirin  EC 81 MG tablet Take 81 mg by mouth daily.   Yes [provider]  atorvastatin  (LIPITOR ) 80 MG tablet TAKE 1 TABLET BY MOUTH EVERY DAY 09/14/22  Yes Hilty, Vinie BROCKS, MD  carvedilol  (COREG ) 3.125 MG tablet TAKE 1 TABLET BY MOUTH TWICE A DAY WITH FOOD  10/03/22  Yes Hilty, Vinie BROCKS, MD  Cyanocobalamin (VITAMIN B-12) 5000 MCG TBDP Take 5,000 Units by mouth daily.   Yes [provider]  diphenhydramine -acetaminophen  (TYLENOL  PM) 25-500 MG TABS tablet Take 1 tablet by mouth at bedtime as needed (Sleep).   Yes [provider]  Emollient (GOLD BOND ULTIMATE HEALING) CREA Apply 1 Application topically daily as needed (Moisturizer).   Yes [provider]  famotidine  (PEPCID ) 10 MG tablet Take 10 mg by mouth daily as needed for heartburn or indigestion.   Yes [provider]  fexofenadine (ALLEGRA) 180 MG tablet Take 180 mg by mouth daily as needed for allergies or rhinitis.   Yes [provider]  folic acid  (FOLVITE ) 800 MCG tablet Take 400 mcg by mouth daily.   Yes [provider]  furosemide  (LASIX ) 80 MG tablet Take 1 tablet (80 mg total) by mouth every Monday, Wednesday, and Friday. Patient taking differently: Take 80 mg by mouth 2 (two) times daily. 07/05/22  Yes Tobie Yetta HERO, MD  gentamicin  cream (GARAMYCIN ) 0.1 % Apply 1 Application topically daily as needed (Rash). 04/21/21  Yes [provider]  Hydrocortisone Acetate 1 % CREA Apply 1 application  topically daily as needed (Itching).   Yes [provider]  insulin  glargine (LANTUS ) 100 UNIT/ML injection Inject 10 Units into the skin daily as needed (high blood glucose). 10/29/19  Yes [provider]  melatonin 1 MG TABS tablet Take 1 mg by mouth at bedtime as needed (Sleep).   Yes [provider]  nitroGLYCERIN  (NITROSTAT ) 0.4 MG SL tablet Place 1 tablet (0.4 mg total) under the tongue every 5 (five) minutes x 3 doses as needed for chest pain. 04/28/19  Yes Hilty, Vinie BROCKS, MD  Nutritional Supplements (,FEEDING SUPPLEMENT, PROSOURCE PLUS) liquid Take 30 mLs by mouth 2 (two) times daily between meals. Patient taking differently: Take 30 mLs by mouth every other day. premier protein 07/04/22  Yes Tobie Yetta HERO, MD   OVER THE COUNTER MEDICATION Place 1 spray into both nostrils daily as needed (Congestion). mucinex saline spray   Yes [provider]  polyethylene glycol (MIRALAX  / GLYCOLAX ) 17 g packet Take 17 g by mouth daily as needed for mild constipation. 07/04/22  Yes Tobie Yetta HERO, MD  Propylene Glycol, PF, (SYSTANE COMPLETE PF) 0.6 % SOLN Place 1 drop into both eyes daily.   Yes [provider]  sevelamer  carbonate (RENVELA ) 800 MG tablet Take 2,400 mg by mouth 2 (two) times daily. 11/09/21  Yes [provider]    Physical Exam: Vitals:   03/07/23 0345 03/07/23 0352 03/07/23 0657 03/07/23 1222  BP:  (!) 115/54 (!) 108/54 127/66  Pulse:  91 83 66  Resp:  20 (!) 21 16  Temp:  (!) 100.4 F (38 C) 99.4 F (37.4 C) 97.7 F (36.5 C)  TempSrc:  Oral Oral Oral  SpO2:  90% 95% 98%  Weight: 64.4 kg      General: Patient is restful, tends to sleep.  Patient is arousable to verbal and tactile stimulation.  Patient interacts briefly and tends to fall back to sleep.  Therefore it is not practical to engage in detailed neurologic exam such as for sensory exam or visual field testing. Neurologically: Grossly no motor deficit.  No nuchal rigidity is elicited Respiratory exam: Bilateral intravesicular Cardiovascular exam S1-S2 normal Abdomen all quadrants are soft nontender Extremities warm without edema no rash Wife at the bedside [place] HD catheter in situ. Site looks healthy over all. Data Reviewed:  Labs on Admission:  Results for orders placed or performed during the hospital encounter of 03/07/23 (from the past 24 hours)  CBC with Differential     Status: Abnormal   Collection Time: 03/07/23  4:27 AM  Result Value Ref Range   WBC 9.5 4.0 - 10.5 K/uL   RBC 2.87 (L) 4.22 - 5.81 MIL/uL   Hemoglobin 9.5 (L) 13.0 - 17.0 g/dL   HCT 70.7 (L) 60.9 - 47.9 %   MCV 101.7 (H) 80.0 - 100.0 fL   MCH 33.1 26.0 - 34.0 pg   MCHC 32.5 30.0 - 36.0 g/dL   RDW 83.9 (H) 88.4 - 84.4 %    Platelets 219 150 - 400 K/uL   nRBC 0.0 0.0 - 0.2 %  Neutrophils Relative % 84 %   Neutro Abs 8.0 (H) 1.7 - 7.7 K/uL   Lymphocytes Relative 6 %   Lymphs Abs 0.6 (L) 0.7 - 4.0 K/uL   Monocytes Relative 9 %   Monocytes Absolute 0.8 0.1 - 1.0 K/uL   Eosinophils Relative 0 %   Eosinophils Absolute 0.0 0.0 - 0.5 K/uL   Basophils Relative 0 %   Basophils Absolute 0.0 0.0 - 0.1 K/uL   WBC Morphology MORPHOLOGY UNREMARKABLE    RBC Morphology See Note    Smear Review Normal platelet morphology    Immature Granulocytes 1 %   Abs Immature Granulocytes 0.06 0.00 - 0.07 K/uL   Acanthocytes PRESENT    Schistocytes PRESENT    Burr Cells PRESENT   Protime-INR     Status: Abnormal   Collection Time: 03/07/23  4:27 AM  Result Value Ref Range   Prothrombin Time 15.6 (H) 11.4 - 15.2 seconds   INR 1.2 0.8 - 1.2  APTT     Status: None   Collection Time: 03/07/23  4:27 AM  Result Value Ref Range   aPTT 33 24 - 36 seconds  Resp panel by RT-PCR (RSV, Flu A&B, Covid) Anterior Nasal Swab     Status: None   Collection Time: 03/07/23  6:35 AM   Specimen: Anterior Nasal Swab  Result Value Ref Range   SARS Coronavirus 2 by RT PCR NEGATIVE NEGATIVE   Influenza A by PCR NEGATIVE NEGATIVE   Influenza B by PCR NEGATIVE NEGATIVE   Resp Syncytial Virus by PCR NEGATIVE NEGATIVE  Urinalysis, w/ Reflex to Culture (Infection Suspected) -Urine, Clean Catch     Status: Abnormal   Collection Time: 03/07/23  6:35 AM  Result Value Ref Range   Specimen Source URINE, CLEAN CATCH    Color, Urine YELLOW YELLOW   APPearance CLEAR CLEAR   Specific Gravity, Urine 1.012 1.005 - 1.030   pH 8.0 5.0 - 8.0   Glucose, UA NEGATIVE NEGATIVE mg/dL   Hgb urine dipstick SMALL (A) NEGATIVE   Bilirubin Urine NEGATIVE NEGATIVE   Ketones, ur NEGATIVE NEGATIVE mg/dL   Protein, ur 899 (A) NEGATIVE mg/dL   Nitrite NEGATIVE NEGATIVE   Leukocytes,Ua NEGATIVE NEGATIVE   RBC / HPF 0-5 0 - 5 RBC/hpf   WBC, UA 0-5 0 - 5 WBC/hpf    Bacteria, UA NONE SEEN NONE SEEN   Squamous Epithelial / HPF 0-5 0 - 5 /HPF  Lactic acid, plasma     Status: None   Collection Time: 03/07/23  6:48 AM  Result Value Ref Range   Lactic Acid, Venous 1.3 0.5 - 1.9 mmol/L  Comprehensive metabolic panel     Status: Abnormal   Collection Time: 03/07/23  7:45 AM  Result Value Ref Range   Sodium 140 135 - 145 mmol/L   Potassium 5.1 3.5 - 5.1 mmol/L   Chloride 96 (L) 98 - 111 mmol/L   CO2 25 22 - 32 mmol/L   Glucose, Bld 158 (H) 70 - 99 mg/dL   BUN 92 (H) 8 - 23 mg/dL   Creatinine, Ser 88.44 (H) 0.61 - 1.24 mg/dL   Calcium  9.0 8.9 - 10.3 mg/dL   Total Protein 6.0 (L) 6.5 - 8.1 g/dL   Albumin 2.7 (L) 3.5 - 5.0 g/dL   AST 27 15 - 41 U/L   ALT 19 0 - 44 U/L   Alkaline Phosphatase 50 38 - 126 U/L   Total Bilirubin 0.7 0.0 - 1.2 mg/dL  GFR, Estimated 4 (L) >60 mL/min   Anion gap 19 (H) 5 - 15   Basic Metabolic Panel: Recent Labs  Lab 03/07/23 0745  NA 140  K 5.1  CL 96*  CO2 25  GLUCOSE 158*  BUN 92*  CREATININE 11.55*  CALCIUM  9.0   Liver Function Tests: Recent Labs  Lab 03/07/23 0745  AST 27  ALT 19  ALKPHOS 50  BILITOT 0.7  PROT 6.0*  ALBUMIN 2.7*   No results for input(s): LIPASE, AMYLASE in the last 168 hours. No results for input(s): AMMONIA in the last 168 hours. CBC: Recent Labs  Lab 03/07/23 0427  WBC 9.5  NEUTROABS 8.0*  HGB 9.5*  HCT 29.2*  MCV 101.7*  PLT 219   Cardiac Enzymes: No results for input(s): CKTOTAL, CKMB, CKMBINDEX, TROPONINIHS in the last 168 hours.  BNP (last 3 results) No results for input(s): PROBNP in the last 8760 hours. CBG: No results for input(s): GLUCAP in the last 168 hours.  Radiological Exams on Admission:  CT ABDOMEN PELVIS W CONTRAST Result Date: 03/07/2023 CLINICAL DATA:  Right lower quadrant pain. Fever. Prostate carcinoma. * Tracking Code: BO * EXAM: CT ABDOMEN AND PELVIS WITH CONTRAST TECHNIQUE: Multidetector CT imaging of the abdomen and pelvis  was performed using the standard protocol following bolus administration of intravenous contrast. RADIATION DOSE REDUCTION: This exam was performed according to the departmental dose-optimization program which includes automated exposure control, adjustment of the mA and/or kV according to patient size and/or use of iterative reconstruction technique. CONTRAST:  65mL OMNIPAQUE  IOHEXOL  350 MG/ML SOLN COMPARISON:  06/23/2022 FINDINGS: Lower Chest: Pulmonary consolidation seen in the right lower lobe with occlusion of several central right lower lobe bronchi, possibly due to aspiration. Patchy airspace disease also noted left lower lobe. Hepatobiliary: No suspicious hepatic masses identified. Gallbladder is unremarkable. No evidence of biliary ductal dilatation. Pancreas:  No mass or inflammatory changes. Spleen: Within normal limits in size and appearance. Adrenals/Urinary Tract: No suspicious masses identified. 2 mm calculus in lower pole of right kidney. No evidence of ureteral calculi or hydronephrosis. Stomach/Bowel: No evidence of obstruction, inflammatory process or abnormal fluid collections. Normal appendix visualized. Large stool burden noted throughout the colon. Vascular/Lymphatic: No pathologically enlarged lymph nodes. No acute vascular findings. Reproductive:  No mass or other significant abnormality. Other:  Near-complete resolution of ascites since prior study. Musculoskeletal:  No suspicious bone lesions identified. IMPRESSION: Right greater than left lower lobe airspace disease with occlusion of several central right lower lobe bronchi, raising suspicion for aspiration pneumonia. No evidence of appendicitis or other acute findings within the abdomen or pelvis. Near-complete resolution of ascites since prior study. Large colonic stool burden noted; recommend clinical correlation for possible constipation. Tiny right renal calculus. No evidence of ureteral calculi or hydronephrosis. Electronically  Signed   By: Norleen DELENA Kil M.D.   On: 03/07/2023 11:38   DG Chest Port 1 View Result Date: 03/07/2023 CLINICAL DATA:  questionable sepsis, with altered mentation and fever. Dialysis patient did not go to the last appointment. States painful urination. EXAM: PORTABLE CHEST 1 VIEW COMPARISON:  AP chest 06/22/2022. FINDINGS: 4:49 a.m. There is a new right IJ dialysis line with the tip at the superior cavoatrial junction. No pneumothorax is seen. There is increased opacity in the suprahilar right upper lobe concerning for a small pneumonia. Remaining lungs clear apart from chronic change in the bases. No pleural effusion is seen. The heart is slightly enlarged. There are old CABG changes. No evidence of  CHF. Osteopenia and thoracic spondylosis. No acute osseous findings. Postsurgical change EG junction. IMPRESSION: 1. Increased opacity in the suprahilar right upper lobe concerning for a small pneumonia. 2. New right IJ dialysis line with the tip at the superior cavoatrial junction. No pneumothorax. 3. Mild cardiomegaly. Electronically Signed   By: Francis Quam M.D.   On: 03/07/2023 06:00   CT HEAD WO CONTRAST ( ) Result Date: 03/07/2023 CLINICAL DATA:  89 year old male status post falls at home. Altered mental status. End stage renal disease. EXAM: CT HEAD WITHOUT CONTRAST TECHNIQUE: Contiguous axial images were obtained from the base of the skull through the vertex without intravenous contrast. RADIATION DOSE REDUCTION: This exam was performed according to the departmental dose-optimization program which includes automated exposure control, adjustment of the mA and/or kV according to patient size and/or use of iterative reconstruction technique. COMPARISON:  Brain MRI 08/27/2021.  Head CT 12/20/2004. FINDINGS: Brain: Scattered dural calcification. Cerebral volume stable from the 2023 MRI. No midline shift, mass effect, or evidence of intracranial mass lesion. No ventriculomegaly. No acute intracranial hemorrhage  identified. No cortically based acute infarct identified. Patchy and confluent bilateral white matter hypodensity stable from the MRI appearance. No cortical encephalomalacia identified. Vascular: Extensive Calcified atherosclerosis at the skull base. No suspicious intracranial vascular hyperdensity. Skull: No fracture identified. Sinuses/Orbits: Chronic left maxillary sinusitis. Other Visualized paranasal sinuses and mastoids are stable and well aerated. Other: Calcified scalp vessel atherosclerosis. Postoperative changes to the globes. No discrete orbit or scalp soft tissue injury identified. IMPRESSION: 1. No acute intracranial abnormality or acute traumatic injury identified. 2. Chronic cerebral white matter disease stable from 2023 MRI. 3. Advanced calcified atherosclerosis. Chronic left maxillary sinusitis. Electronically Signed   By: VEAR Hurst M.D.   On: 03/07/2023 05:37    EKG: Independently reviewed. Artifact present.  I suspect underlying sinus rhythm.   Assessment and Plan: * Pneumonia Right more than left lobe with CAT scan demonstrating occlusion of some of the small airways.  Concerning for aspiration.  S/p ceftriaxone  and azithromycin  in the ER.  S/p blood cultures.  I will treat the patient with Unasyn  and azithromycin  going forward.  Patient not hypoxic at this time.  Monitor pulse ox. Swallow eval pending.  AMS (altered mental status) Patient is febrile, has missed dialysis for the last 4 days.  See HPI, EMS noted initially as visual hallucinations 3 days ago..  Some tendency to fall.  As well as worsening orientation last several days.  At this time I think patient has metabolic encephalopathy due to febrile illness of pneumonia as well as missed dialysis.  Patient has no nuchal rigidity.  Overall not complaining of any headache or looking toxic.  Head CT nonactionable.  We will monitor clinically.  Pursue MRI if persistent/not resolving. Check tsh, b12. Patient does have underlying  chornic memory issues per wife.  ESRD on peritoneal dialysis [facility]) Last HD was on Jan 4th. HD on Jan 7 was cancelled due to a fall patient suffered at home and could not reach the unit on time. Dr.  Geralynn has been engaged by the ER provider - thank you. Look forward to his input. I suspect the elevated BUN and creatinie is contributing to patient metablic enecephalopahty.  Patient takes Lasix  3 times a day.  At this time patient appears rather euvolemic.  Hold Lasix .  Patient received 1 L normal saline bolus in the ER  Continue with sevelamer .  Check phosphate level in the morning  DM2 (diabetes mellitus, type 2) (  HCC) Patient on 10 units of as needed insulin  Lantus  at home.  At this time hold same.  We will treat the patient with insulin  sliding scale sensitive  Essential hypertension Patient diastolic blood pressure less than 60 mmHg at presentation.  Given acute illness, at this time I will hold the Coreg .  Please restart once patient's blood pressure improves.  PAD (peripheral artery disease) (HCC) Chronic. No evidence of ishcemia limb threatening at this time. C.w. aspirin , lipitor     GI and DVT ppx ordered with heparin  and pantop respectively. Plan to start diet once patient is awake. Hold famotidine  that patient takes for as needed heartburn/indigestion at home.   Advance Care Planning:   Code Status: Do not attempt resuscitation (DNR) PRE-ARREST INTERVENTIONS DESIRED discussed with wife at the bedside. Consistent with prior documented code statuses in the chart. No ACP present in chart  Consults: Renal as above.   Family Communication: discussed with wife as above  Severity of Illness: The appropriate patient status for this patient is INPATIENT. Inpatient status is judged to be reasonable and necessary in order to provide the required intensity of service to ensure the patient's safety. The patient's presenting symptoms, physical exam findings, and initial radiographic and  laboratory data in the context of their chronic comorbidities is felt to place them at high risk for further clinical deterioration. Furthermore, it is not anticipated that the patient will be medically stable for discharge from the hospital within 2 midnights of admission.   * I certify that at the point of admission it is my clinical judgment that the patient will require inpatient hospital care spanning beyond 2 midnights from the point of admission due to high intensity of service, high risk for further deterioration and high frequency of surveillance required.*  Author: Jacqulyn Divine, MD 03/07/2023 1:47 PM  For on call review [email or web address].

## 2023-03-07 NOTE — Plan of Care (Signed)
  Problem: Nutritional: Goal: Maintenance of adequate nutrition will improve Outcome: Progressing   Problem: Education: Goal: Knowledge of General Education information will improve Description: Including pain rating scale, medication(s)/side effects and non-pharmacologic comfort measures Outcome: Progressing   Problem: Activity: Goal: Risk for activity intolerance will decrease Outcome: Progressing

## 2023-03-07 NOTE — Assessment & Plan Note (Addendum)
 Chronic. No evidence of ishcemia limb threatening at this time. C.w. aspirin, lipitor

## 2023-03-07 NOTE — ED Provider Notes (Signed)
 Patient signed out to me at 0700 by Dr. Theadore pending CMP and CTAP.  In short this is a 89 year old male with a past medical history of ESRD, CAD, diabetes, CHF who presented to the emergency department with altered mental status and multiple falls.  Patient's wife reports that he has been hallucinating such as saying that there was an field seismologist in their front yard.  Patient missed his last dialysis session on 2-day and reports that he was complaining of pain with urination.  Patient's initial workup showed negative UA, possible pneumonia on chest x-ray, CT head without acute disease.  Patient's wife does report that he has had a cough recently.  Patient is tender to palpation in the right lower quadrant, no rebound or guarding.  He was having right lower quadrant tenderness and CTAP is pending.  Clinical Course as of 03/07/23 1230  Wed Mar 07, 2023  9144 Patient with significant uremia from baseline which may be contributing to AMS. Will plan to discuss with nephrology to facilitate HD. [VK]  [phone] Notified by RN patient requesting to leave. He is A&Ox2 which is not his baseline, persistently confused and unable to sign out AMA himself. Wife agrees he is still confused and is agreeable with him staying. He was walked back to his stretcher bed and will be monitored for need of 1:1 for wandering or any sedation should he become agitated.  [VK]  1156 CTAP with constipation and possible aspiration pneumonia, no acute intraabdominal infection.  [VK]  1229 I spoke with Dr. Geralynn with nephrology who will evaluate the patient for HD. Patient signed out to hospitalist Dr. Moody for admission. [VK]    Clinical Course User Index [VK] Kingsley, Cadence Minton K, DO      Kingsley, Thadeus Gandolfi K, [place] 03/07/23 1202

## 2023-03-07 NOTE — Assessment & Plan Note (Addendum)
 Last HD was on Jan 4th. HD on Jan 7 was cancelled due to a fall patient suffered at home and could not reach the unit on time. Dr.  Geralynn has been engaged by the ER provider - thank you. Look forward to his input. I suspect the elevated BUN and creatinie is contributing to patient metablic enecephalopahty.  Patient takes Lasix  3 times a day.  At this time patient appears rather euvolemic.  Hold Lasix .  Patient received 1 L normal saline bolus in the ER  Continue with sevelamer .  Check phosphate level in the morning

## 2023-03-07 NOTE — ED Provider Notes (Addendum)
 [facility] Emergency Department Provider Note MRN:  [id number]  Arrival date & time: 03/07/23     Chief Complaint   Fall   History of Present Illness   [name] is a 89 y.o. year-old male with a history of CAD, ESRD presenting to the ED with chief complaint of fall.  Multiple falls today, fever, confused, missed dialysis.  Having some pain with urination recently.  Review of Systems  I was unable to obtain a full/accurate HPI, PMH, or ROS due to the patient's altered mental status.  Patient's Health History    Past Medical History:  Diagnosis Date   Acute coronary syndrome (HCC) 04/18/2019   Aortic stenosis 02/27/2020   mild to moderate AS   Arthritis    a little in LLE (06/20/2017)   Ataxia    Chronic diastolic CHF (congestive heart failure) (HCC)    Coronary artery disease    a. s/p CABG 1980s. b. stable by cath 05/2017.   Dizziness    Dyslipidemia    Edema 08/08/2017   HANDS & LOWER EXTREMITES   ESRD on hemodialysis (HCC)    Hypertension    Hypoalbuminemia    Nephrotic range proteinuria    Peripheral artery disease (HCC)    mild   Pneumonia ~ 1950 X 1   Prostate cancer (HCC) ~ 2015   Type II diabetes mellitus (HCC)     Past Surgical History:  Procedure Laterality Date   CAPD REVISION N/A 06/29/2022   Procedure: REPLACEMENT OF PERITONEAL DIALYSIS CATHETER;  Surgeon: Magda Debby SAILOR, MD;  Location: [facility] OR;  Service: Vascular;  Laterality: N/A;   CARDIAC CATHETERIZATION     a couple times (06/20/2017)   CATARACT EXTRACTION W/ INTRAOCULAR LENS  IMPLANT, BILATERAL Bilateral    COLONOSCOPY     CORONARY ARTERY BYPASS GRAFT  03/06/1985   LAD and first diagonal/circumflex (sequential saphenous vein graft,cardioplegia   INGUINAL HERNIA REPAIR Right 12/06/2020   Procedure: OPEN RIGHT INGUINAL HERNIA REPAIR;  Surgeon: Gladis Cough, MD;  Location: [facility] ORS;  Service: General;  Laterality: Right;   INSERTION PROSTATE RADIATION SEED  ~  2015   IR FLUORO GUIDE CV LINE RIGHT  06/26/2022   IR US  GUIDE VASC ACCESS RIGHT  06/26/2022   LAPAROSCOPIC LYSIS OF ADHESIONS N/A 06/29/2022   Procedure: LAPAROSCOPIC LYSIS OF ADHESIONS;  Surgeon: Magda Debby SAILOR, MD;  Location: [facility];  Service: Vascular;  Laterality: N/A;   LAPAROSCOPY N/A 06/29/2022   Procedure: LAPAROSCOPY DIAGNOSTIC;  Surgeon: Magda Debby SAILOR, MD;  Location: [facility];  Service: Vascular;  Laterality: N/A;   LEFT HEART CATH AND CORS/GRAFTS ANGIOGRAPHY N/A 02/27/2023   Procedure: LEFT HEART CATH AND CORS/GRAFTS ANGIOGRAPHY;  Surgeon: Anner Alm ORN, MD;  Location: [facility] INVASIVE CV LAB;  Service: Cardiovascular;  Laterality: N/A;   NM MYOCAR PERF WALL MOTION  09/12/2006   no ischemia   REMOVAL OF A DIALYSIS CATHETER N/A 07/04/2022   Procedure: PERITONEAL CATHETER REMOVAL;  Surgeon: Lanis Fonda BRAVO, MD;  Location: [facility] OR;  Service: Vascular;  Laterality: N/A;   RIGHT/LEFT HEART CATH AND CORONARY/GRAFT ANGIOGRAPHY N/A 06/21/2017   Procedure: RIGHT/LEFT HEART CATH AND CORONARY/GRAFT ANGIOGRAPHY;  Surgeon: Verlin Lonni BIRCH, MD;  [facility];  Service: Cardiovascular;  Laterality: N/A;   US  ECHOCARDIOGRAPHY  11/04/2008   trace TR & MR    Family History  Problem Relation Age of Onset   Heart attack Mother     Social History   Socioeconomic History  Marital status: Married    Spouse name: [name]   Number of children: Not on file   Years of education: Not on file   Highest education level: Not on file  Occupational History   Occupation: Retired  Tobacco Use   Smoking status: Former    Current packs/day: 0.00    Average packs/day: 1 pack/day for 20.0 years (20.0 ttl pk-yrs)    Types: Cigarettes    Start date: 08/22/1952    Quit date: 08/22/1972    Years since quitting: 50.5   Smokeless tobacco: Never  Vaping Use   Vaping status: Never Used  Substance and Sexual Activity   Alcohol  use: Not Currently    Comment: 06/20/2017 nothing since ~ 1974    Drug use: No   Sexual activity: Not Currently  Other Topics Concern   Not on file  Social History Narrative   Lives with wife   Right handed   Caffeine- 1 cup some days    Social Drivers of Corporate Investment Banker Strain: Not on file  Food Insecurity: No Food Insecurity (06/22/2022)   Hunger Vital Sign    Worried About Running Out of Food in the Last Year: Never true    Ran Out of Food in the Last Year: Never true  Transportation Needs: No Transportation Needs (06/22/2022)   PRAPARE - Administrator, Civil Service (Medical): No    Lack of Transportation (Non-Medical): No  Physical Activity: Sufficiently Active (10/31/2019)   Exercise Vital Sign    Days of Exercise per Week: 7 days    Minutes of Exercise per Session: 40 min  Stress: Not on file  Social Connections: Not on file  Intimate Partner Violence: Not At Risk (06/22/2022)   Humiliation, Afraid, Rape, and Kick questionnaire    Fear of Current or Ex-Partner: No    Emotionally Abused: No    Physically Abused: No    Sexually Abused: No     Physical Exam   Vitals:   03/07/23 0352 03/07/23 0657  BP: (!) 115/54 (!) 108/54  Pulse: 91 83  Resp: 20 (!) 21  Temp: (!) 100.4 F (38 C) 99.4 F (37.4 C)  SpO2: 90% 95%    CONSTITUTIONAL: Well-appearing, NAD NEURO/PSYCH:  Alert, oriented to name, moves all extremities equally EYES:  eyes equal and reactive ENT/NECK:  no LAD, no JVD CARDIO: Regular rate, well-perfused, normal S1 and S2 PULM:  CTAB no wheezing or rhonchi GI/GU:  non-distended, non-tender MSK/SPINE:  No gross deformities, no edema SKIN:  no rash, atraumatic   *Additional and/or pertinent findings included in MDM below  Diagnostic and Interventional Summary    EKG Interpretation Date/Time:  Wednesday March 07 2023 05:11:58 EST Ventricular Rate:  86 PR Interval:    QRS Duration:  92 QT Interval:  390 QTC Calculation: 466 R Axis:   39  Text Interpretation: Accelerated Junctional rhythm  ST & T wave abnormality, consider inferolateral ischemia Abnormal ECG No previous ECGs available Confirmed by Theadore Sharper [phone]) on 03/07/2023 6:59:43 AM       Labs Reviewed  CBC WITH DIFFERENTIAL/PLATELET - Abnormal; Notable for the following components:      Result Value   RBC 2.87 (*)    Hemoglobin 9.5 (*)    HCT 29.2 (*)    MCV 101.7 (*)    RDW 16.0 (*)    All other components within normal limits  PROTIME-INR - Abnormal; Notable for the following components:   Prothrombin Time 15.6 (*)  All other components within normal limits  URINALYSIS, W/ REFLEX TO CULTURE (INFECTION SUSPECTED) - Abnormal; Notable for the following components:   Hgb urine dipstick SMALL (*)    Protein, ur 100 (*)    All other components within normal limits  RESP PANEL BY RT-PCR (RSV, FLU A&B, COVID)  RVPGX2  CULTURE, BLOOD (ROUTINE X 2)  CULTURE, BLOOD (ROUTINE X 2)  APTT  LACTIC ACID, PLASMA  COMPREHENSIVE METABOLIC PANEL  LACTIC ACID, PLASMA    CT HEAD WO CONTRAST ( )  Final Result    DG Chest Port 1 View  Final Result    CT ABDOMEN PELVIS W CONTRAST    (Results Pending)    Medications  cefTRIAXone  (ROCEPHIN ) 2 g in sodium chloride  0.9 % 100 mL IVPB (2 g Intravenous New Bag/Given 03/07/23 0649)  sodium chloride  0.9 % bolus 1,000 mL (1,000 mLs Intravenous New Bag/Given 03/07/23 0657)     Procedures  /  Critical Care .Critical Care  Performed by: Theadore Ozell HERO, MD Authorized by: Theadore Ozell HERO, MD   Critical care provider statement:    Critical care time (minutes):  32   Critical care was necessary to treat or prevent imminent or life-threatening deterioration of the following conditions:  Sepsis   Critical care was time spent personally by me on the following activities:  Development of treatment plan with patient or surrogate, discussions with consultants, evaluation of patient's response to treatment, examination of patient, ordering and review of laboratory studies, ordering and  review of radiographic studies, ordering and performing treatments and interventions, pulse oximetry, re-evaluation of patient's condition and review of old charts   ED Course and Medical Decision Making  Initial Impression and Ddx Patient has some suprapubic tenderness, reportedly some dysuria, having confusion, fever, heart rate greater than 90 and so appropriate for code sepsis alert, empiric treatment with fluids, antibiotics.  Past medical/surgical history that increases complexity of ED encounter: ESRD  Interpretation of Diagnostics I personally reviewed the EKG and my interpretation is as follows: Sinus rhythm, no peaked T waves  Labs pending  Patient Reassessment and Ultimate Disposition/Management     Anticipating admission for concern for sepsis.  Signed out to oncoming provider at shift change.  7:10 AM update: Urinalysis without obvious infection.  Given this and patient's lower abdominal tenderness will obtain CT imaging to further evaluate for infectious source.  Signed out to oncoming provider.  Patient management required discussion with the following services or consulting groups:  Hospitalist Service  Complexity of Problems Addressed Acute illness or injury that poses threat of life of bodily function  Additional Data Reviewed and Analyzed Further history obtained from: Further history from spouse/family member  Additional Factors Impacting ED Encounter Risk Consideration of hospitalization  Ozell HERO. Theadore, MD [facility] Emergency Medicine [facility] [email or web address]  Final Clinical Impressions(s) / ED Diagnoses     ICD-10-CM   1. Fall, initial encounter  W19.XXXA     2. Sepsis, due to unspecified organism, unspecified whether acute organ dysfunction present [facility])  A41.9       ED Discharge Orders     None        Discharge Instructions Discussed with and Provided to Patient:   Discharge Instructions   None      Theadore Ozell HERO, MD 03/07/23 0700    Theadore Ozell HERO, MD 03/07/23 [phone]

## 2023-03-07 NOTE — Assessment & Plan Note (Addendum)
 Patient is febrile, has missed dialysis for the last 4 days.  See HPI, EMS noted initially as visual hallucinations 3 days ago..  Some tendency to fall.  As well as worsening orientation last several days.  At this time I think patient has metabolic encephalopathy due to febrile illness of pneumonia as well as missed dialysis.  Patient has no nuchal rigidity.  Overall not complaining of any headache or looking toxic.  Head CT nonactionable.  We will monitor clinically.  Pursue MRI if persistent/not resolving. Check tsh, b12. Patient does have underlying chornic memory issues per wife.

## 2023-03-07 NOTE — ED Notes (Signed)
 Pt is pleasantly AMS/ pt is aware of self, place, but not aware of time or situation.

## 2023-03-07 NOTE — Plan of Care (Signed)
  Problem: Education: Goal: Ability to describe self-care measures that may prevent or decrease complications (Diabetes Survival Skills Education) will improve Outcome: Progressing Goal: Individualized Educational Video(s) Outcome: Progressing   Problem: Coping: Goal: Ability to adjust to condition or change in health will improve Outcome: Progressing   Problem: Fluid Volume: Goal: Ability to maintain a balanced intake and output will improve Outcome: Progressing   Problem: Health Behavior/Discharge Planning: Goal: Ability to identify and utilize available resources and services will improve Outcome: Progressing Goal: Ability to manage health-related needs will improve Outcome: Progressing   Problem: Metabolic: Goal: Ability to maintain appropriate glucose levels will improve Outcome: Progressing   Problem: Nutritional: Goal: Maintenance of adequate nutrition will improve Outcome: Progressing Goal: Progress toward achieving an optimal weight will improve Outcome: Progressing   Problem: Skin Integrity: Goal: Risk for impaired skin integrity will decrease Outcome: Progressing   Problem: Tissue Perfusion: Goal: Adequacy of tissue perfusion will improve Outcome: Progressing   Problem: Education: Goal: Knowledge of General Education information will improve Description: Including pain rating scale, medication(s)/side effects and non-pharmacologic comfort measures Outcome: Progressing   Problem: Health Behavior/Discharge Planning: Goal: Ability to manage health-related needs will improve Outcome: Progressing   Problem: Clinical Measurements: Goal: Ability to maintain clinical measurements within normal limits will improve Outcome: Progressing Goal: Will remain free from infection Outcome: Progressing Goal: Diagnostic test results will improve Outcome: Progressing Goal: Respiratory complications will improve Outcome: Progressing Goal: Cardiovascular complication will  be avoided Outcome: Progressing   Problem: Activity: Goal: Risk for activity intolerance will decrease Outcome: Progressing   Problem: Nutrition: Goal: Adequate nutrition will be maintained Outcome: Progressing   Problem: Coping: Goal: Level of anxiety will decrease Outcome: Progressing   Problem: Elimination: Goal: Will not experience complications related to bowel motility Outcome: Progressing Goal: Will not experience complications related to urinary retention Outcome: Progressing   Problem: Pain Management: Goal: General experience of comfort will improve Outcome: Progressing   Problem: Safety: Goal: Ability to remain free from injury will improve Outcome: Progressing   Problem: Skin Integrity: Goal: Risk for impaired skin integrity will decrease Outcome: Progressing   Problem: Education: Goal: Knowledge of General Education information will improve Description: Including pain rating scale, medication(s)/side effects and non-pharmacologic comfort measures Outcome: Progressing   Problem: Health Behavior/Discharge Planning: Goal: Ability to manage health-related needs will improve Outcome: Progressing   Problem: Clinical Measurements: Goal: Ability to maintain clinical measurements within normal limits will improve Outcome: Progressing Goal: Will remain free from infection Outcome: Progressing Goal: Diagnostic test results will improve Outcome: Progressing Goal: Respiratory complications will improve Outcome: Progressing Goal: Cardiovascular complication will be avoided Outcome: Progressing   Problem: Activity: Goal: Risk for activity intolerance will decrease Outcome: Progressing   Problem: Nutrition: Goal: Adequate nutrition will be maintained Outcome: Progressing   Problem: Coping: Goal: Level of anxiety will decrease Outcome: Progressing   Problem: Elimination: Goal: Will not experience complications related to bowel motility Outcome:  Progressing Goal: Will not experience complications related to urinary retention Outcome: Progressing   Problem: Pain Management: Goal: General experience of comfort will improve Outcome: Progressing   Problem: Safety: Goal: Ability to remain free from injury will improve Outcome: Progressing   Problem: Skin Integrity: Goal: Risk for impaired skin integrity will decrease Outcome: Progressing

## 2023-03-07 NOTE — Progress Notes (Signed)
 Pharmacy Antibiotic Note  [name] is a 89 y.o. male admitted on 03/07/2023 with  aspiration pneumonia .  H/o ESRD on TThSa HD. Pharmacy has been consulted for Unasyn  dosing. Has received one dose of ceftriaxone  and azithromycin .  Plan: Start Unasyn   3g IV Q24H Continue azithromycin  500mg  IV Q24H per MD F/u nephro recs, clinical progression, cultures, and LOT  Weight: 64.4 kg (142 lb)  Temp (24hrs), Avg:99.2 F (37.3 C), Min:97.7 F (36.5 C), Max:100.4 F (38 C)  Recent Labs  Lab 03/07/23 0427 03/07/23 0648 03/07/23 0745  WBC 9.5  --   --   CREATININE  --   --  11.55*  LATICACIDVEN  --  1.3  --     Estimated Creatinine Clearance: 4.1 mL/min (A) (by C-G formula based on SCr of 11.55 mg/dL (H)).    Allergies  Allergen Reactions   Clonidine  Hcl     Other reaction(s): constipation   Thank you for allowing pharmacy to be a part of this patient's care.  Harlene VEAR Mandril 03/07/2023 2:04 PM

## 2023-03-07 NOTE — Assessment & Plan Note (Signed)
 Patient on 10 units of "as needed "insulin Lantus at home.  At this time hold same.  We will treat the patient with insulin sliding scale sensitive

## 2023-03-07 NOTE — ED Notes (Signed)
 Abx delayed d/t no Iv access until 0630

## 2023-03-07 NOTE — ED Triage Notes (Signed)
 BIB EMS from home/ fall x2 at home/ wife states AMS/ fever/ dialysis pt, did not go to last dialysis treatment/ wife states possible UTI/ pt states "its hurts when I pee"/ pt is A&Ox2 at this time

## 2023-03-08 DIAGNOSIS — J189 Pneumonia, unspecified organism: Secondary | ICD-10-CM | POA: Diagnosis not present

## 2023-03-08 LAB — TSH: TSH: 5.417 u[IU]/mL — ABNORMAL HIGH (ref 0.350–4.500)

## 2023-03-08 LAB — GLUCOSE, CAPILLARY
Glucose-Capillary: 106 mg/dL — ABNORMAL HIGH (ref 70–99)
Glucose-Capillary: 135 mg/dL — ABNORMAL HIGH (ref 70–99)
Glucose-Capillary: 136 mg/dL — ABNORMAL HIGH (ref 70–99)
Glucose-Capillary: 130 mg/dL — ABNORMAL HIGH (ref 70–99)

## 2023-03-08 LAB — PROTIME-INR
INR: 1.2 (ref 0.8–1.2)
Prothrombin Time: 15.8 s — ABNORMAL HIGH (ref 11.4–15.2)

## 2023-03-08 LAB — BASIC METABOLIC PANEL
Anion gap: 16 — ABNORMAL HIGH (ref 5–15)
BUN: 93 mg/dL — ABNORMAL HIGH (ref 8–23)
CO2: 24 mmol/L (ref 22–32)
Calcium: 9.1 mg/dL (ref 8.9–10.3)
Chloride: 99 mmol/L (ref 98–111)
Creatinine, Ser: 12.53 mg/dL — ABNORMAL HIGH (ref 0.61–1.24)
GFR, Estimated: 4 mL/min — ABNORMAL LOW (ref >60)
Glucose, Bld: 129 mg/dL — ABNORMAL HIGH (ref 70–99)
Potassium: 5 mmol/L (ref 3.5–5.1)
Sodium: 139 mmol/L (ref 135–145)

## 2023-03-08 LAB — CBC
HCT: 28.1 % — ABNORMAL LOW (ref 39.0–52.0)
Hemoglobin: 9.2 g/dL — ABNORMAL LOW (ref 13.0–17.0)
MCH: 33 pg (ref 26.0–34.0)
MCHC: 32.7 g/dL (ref 30.0–36.0)
MCV: 100.7 fL — ABNORMAL HIGH (ref 80.0–100.0)
Platelets: 229 10*3/uL (ref 150–400)
RBC: 2.79 MIL/uL — ABNORMAL LOW (ref 4.22–5.81)
RDW: 16.1 % — ABNORMAL HIGH (ref 11.5–15.5)
WBC: 10 10*3/uL (ref 4.0–10.5)
nRBC: 0.2 % (ref 0.0–0.2)

## 2023-03-08 LAB — PHOSPHORUS: Phosphorus: 4.2 mg/dL (ref 2.5–4.6)

## 2023-03-08 LAB — HEPATITIS B SURFACE ANTIBODY, QUANTITATIVE: Hep B S AB Quant (Post): 26.6 m[IU]/mL

## 2023-03-08 LAB — APTT: aPTT: 36 s (ref 24–36)

## 2023-03-08 LAB — VITAMIN B12: Vitamin B-12: >7500 pg/mL — ABNORMAL HIGH (ref 180–914)

## 2023-03-08 MED ORDER — STERILE WATER FOR INJECTION IJ SOLN
INTRAMUSCULAR | Status: AC
  Filled 2023-03-08: qty 10

## 2023-03-08 MED ORDER — DARBEPOETIN ALFA 100 MCG/0.5ML IJ SOSY
100.0000 ug | PREFILLED_SYRINGE | INTRAMUSCULAR | Status: DC
Start: 2023-03-10 — End: 2023-03-10
  Filled 2023-03-08: qty 0.5

## 2023-03-08 MED ORDER — SODIUM CHLORIDE 0.9 % IV SOLN
3.0000 g | INTRAVENOUS | Status: DC
  Administered 2023-03-08: 3 g via INTRAVENOUS
  Filled 2023-03-08 (×2): qty 8

## 2023-03-08 NOTE — Progress Notes (Signed)
 SLP Cancellation Note  Patient Details Name: [name] MRN: [id number] DOB: May 11, 1933   Cancelled treatment:       Reason Eval/Treat Not Completed: Patient at procedure or test/unavailable (HD). SLP will continue to follow as schedule allows.    Damien Blumenthal, M.A., CF-SLP Speech Language Pathology, Acute Rehabilitation Services  Secure Chat preferred [phone]  03/08/2023, 9:06 AM

## 2023-03-08 NOTE — Progress Notes (Signed)
 PROGRESS NOTE  [name]  DOB: 1933/08/31  PCP: Corlis Pagan, NP FMW:[id number]  DOA: 03/07/2023  LOS: 1 day  Hospital Day: 2  Brief narrative: [name] is a 89 y.o. male with PMH significant for ESRD-HD-TTS, DM2, HTN, HLD, CAD/CABG, mild to moderate aortic stenosis, PAD, prostate cancer. At baseline, physical independent but has cognitive issues, lives with wife at home who manages his financial affairs and complex transactions. 1/8, patient presented to the ED with complaint of generalized fatigue, falls.  Symptoms started 3 days ago. Last HD was on Jan 4th. HD on Jan 7 was cancelled due to a fall patient suffered at home and could not reach the unit on time.  EMS was called after a fall on 1/8 and patient was brought to the ED.  In the ED, patient had a temperature 100.4 Chest x-ray showed right upper lobe infiltrates. CT abdomen and pelvis was also obtained which suggested bilateral airspace opacities, right more than left.  Also suggested large colonic stool burden. Blood culture collected Patient was started on IV Rocephin , azithromycin  for pneumonia Admitted to [facility] Nephrology consulted for maintenance dialysis  Subjective: Patient was seen and examined this morning at dialysis. Not in distress.  No new symptoms.  Feels better than presentation.  Was not on supplemental oxygen at the time. Chart reviewed No fever in last 24 hours, hemodynamically stable Last set of labs this morning with WBC count 10  Assessment and plan: Multifocal pneumonia Suspect aspiration pneumonia Initially given on IV Rocephin  and azithromycin  in the ED. Switch to IV Unasyn  on admission Speech therapy consulted for swallow eval WBC count normal, lactic acid not elevated. Recent Labs  Lab 03/07/23 0427 03/07/23 0648 03/07/23 1550 03/08/23 0529  WBC 9.5  --  10.4 10.0  LATICACIDVEN  --  1.3  --   --    Acute metabolic encephalopathy Underlying cognitive dysfunction Brought in for  altered mental status, weakness, fall.  Patient also missed 1 set of dialysis prior to presentation  CT head unremarkable Mental status gradually improving.   ESRD-HD-TTS Nephrology consult appreciated.  Underwent dialysis this morning.  Type 2 diabetes mellitus A1c 6.1 on 03/07/2023 PTA meds-on Lantus  as needed?? Currently on SSI/Accu-Cheks Recent Labs  Lab 03/07/23 1728 03/07/23 2023 03/08/23 0620 03/08/23 1318  GLUCAP 106* 191* 106* 135*    Essential hypertension PTA meds- Coreg , Lasix   Currently blood pressure is running low normal.  Meds on hold Continue monitor and resume as needed   PAD Chronic. No evidence of ishcemia limb threatening at this time.  C.w. aspirin , lipitor     Mobility: Encourage ambulation  Goals of care   Code Status: Do not attempt resuscitation (DNR) PRE-ARREST INTERVENTIONS DESIRED     DVT prophylaxis:  heparin  injection 5,000 Units Start: 03/08/23 0600 SCDs Start: 03/07/23 1342   Antimicrobials: IV Unasyn  Fluid: None Consultants: Nephrology Family Communication: None at bedside  Status: Inpatient Level of care:  Med-Surg   Patient is from: Home Needs to continue in-hospital care: If hemodynamically stable and able to ambulate, plan to discharge home tomorrow    Diet:  Diet Order             Diet Carb Modified Fluid consistency: Thin; Room service appropriate? Yes  Diet effective now                   Scheduled Meds:  (feeding supplement) PROSource Plus  30 mL Oral QODAY   aspirin  EC  81 mg Oral  Daily   atorvastatin   80 mg Oral Daily   Chlorhexidine  Gluconate Cloth  6 each Topical Q0600   [START ON 03/10/2023] darbepoetin (ARANESP ) injection - DIALYSIS  100 mcg Subcutaneous Q Sat-1800   heparin   5,000 Units Subcutaneous Q8H   insulin  aspart  0-5 Units Subcutaneous QHS   insulin  aspart  0-9 Units Subcutaneous TID WC   pantoprazole  (PROTONIX ) IV  40 mg Intravenous Q24H   polyvinyl alcohol   1 drop Both Eyes Daily    senna-docusate  2 tablet Oral BID   sevelamer  carbonate  2,400 mg Oral BID   sodium chloride  flush  3 mL Intravenous Q12H   sterile water  (preservative free)        PRN meds: acetaminophen  **OR** acetaminophen , melatonin, polyethylene glycol, sterile water  (preservative free)   Infusions:   ampicillin -sulbactam (UNASYN ) IV     azithromycin  (ZITHROMAX ) 500 mg in sodium chloride  0.9 % 250 mL IVPB 500 mg (03/08/23 1248)    Antimicrobials: Anti-infectives (From admission, onward)    Start     Dose/Rate Route Frequency Ordered Stop   03/08/23 1800  Ampicillin -Sulbactam (UNASYN ) 3 g in sodium chloride  0.9 % 100 mL IVPB        3 g 200 mL/hr over 30 Minutes Intravenous Every 24 hours 03/08/23 1104     03/08/23 1200  azithromycin  (ZITHROMAX ) 500 mg in sodium chloride  0.9 % 250 mL IVPB        500 mg 250 mL/hr over 60 Minutes Intravenous Every 24 hours 03/07/23 1345 03/11/23 1159   03/07/23 1500  Ampicillin -Sulbactam (UNASYN ) 3 g in sodium chloride  0.9 % 100 mL IVPB  Status:  Discontinued        3 g 200 mL/hr over 30 Minutes Intravenous Every 24 hours 03/07/23 1403 03/08/23 1104   03/07/23 1200  azithromycin  (ZITHROMAX ) 500 mg in sodium chloride  0.9 % 250 mL IVPB        500 mg 250 mL/hr over 60 Minutes Intravenous  Once 03/07/23 1156 03/07/23 1515   03/07/23 0430  cefTRIAXone  (ROCEPHIN ) 2 g in sodium chloride  0.9 % 100 mL IVPB        2 g 200 mL/hr over 30 Minutes Intravenous  Once 03/07/23 0428 03/07/23 0854       Objective: Vitals:   03/08/23 1117 03/08/23 1120  BP: (!) 112/55 131/63  Pulse: 63 61  Resp: 19 (!) 22  Temp: 98.2 F (36.8 C)   SpO2: 100% 100%    Intake/Output Summary (Last 24 hours) at 03/08/2023 1328 Last data filed at 03/08/2023 1128 Gross per 24 hour  Intake --  Output 1500 ml  Net -1500 ml   Filed Weights   03/07/23 0345 03/08/23 0746 03/08/23 1120  Weight: 64.4 kg 72.7 kg 66.9 kg   Weight change:  Body mass index is 20.57 kg/m.   Physical  Exam: General exam: Pleasant, elderly African-American male Skin: No rashes, lesions or ulcers. HEENT: Atraumatic, normocephalic, no obvious bleeding Lungs: Clear to auscultation bilaterally,  CVS: S1, S2, no murmur,   GI/Abd: Soft, nontender, nondistended, bowel sound present,   CNS: Alert, awake, oriented x 3 Psychiatry: Mood appropriate,  Extremities: No pedal edema, no calf tenderness,   Data Review: I have personally reviewed the laboratory data and studies available.  F/u labs ordered Unresulted Labs (From admission, onward)    None       Admission date and time: 03/07/2023  3:35 AM   Total time spent in review of labs and imaging, patient evaluation, formulation  of plan, documentation and communication with family: 45 minutes  Signed, Chapman Rota, MD [facility] 03/08/2023

## 2023-03-08 NOTE — Progress Notes (Signed)
 Received patient in bed to unit.  Alert and oriented.  Informed consent signed and in chart.   TX duration:3:15  Patient tolerated well.  Transported back to the room  Alert, without acute distress.  Hand-off given to patient's nurse.   Access used: right HD catheter Access issues: none  Total UF removed: 1.5L Medication(s) given: tylenol    03/08/23 1117  Vitals  Temp 98.2 F (36.8 C)  Temp Source Oral  BP (!) 112/55  MAP (mmHg) 71  BP Location Right Arm  BP Method Automatic  Patient Position (if appropriate) Lying  Pulse Rate 63  Pulse Rate Source Monitor  ECG Heart Rate 63  Resp 19  Oxygen Therapy  SpO2 100 %  O2 Device Room Air  During Treatment Monitoring  Blood Flow Rate (mL/min) 399 mL/min  Arterial Pressure (mmHg) -182.41 mmHg  Venous Pressure (mmHg) 166.05 mmHg  TMP (mmHg) 3.84 mmHg  Ultrafiltration Rate (mL/min) 843 mL/min  Dialysate Flow Rate (mL/min) 300 ml/min  Duration of HD Treatment -hour(s) 3.21 hour(s)  Cumulative Fluid Removed (mL) per Treatment  8533.74  HD Safety Checks Performed Yes  Intra-Hemodialysis Comments Tx completed;Tolerated well  Dialysis Fluid Bolus Normal Saline  Bolus Amount (mL) 300 mL      Hamlet Lasecki [facility]

## 2023-03-08 NOTE — Plan of Care (Signed)
  Problem: Education: Goal: Ability to describe self-care measures that may prevent or decrease complications (Diabetes Survival Skills Education) will improve Outcome: Progressing Goal: Individualized Educational Video(s) Outcome: Progressing   Problem: Coping: Goal: Ability to adjust to condition or change in health will improve Outcome: Progressing   Problem: Fluid Volume: Goal: Ability to maintain a balanced intake and output will improve Outcome: Progressing   Problem: Health Behavior/Discharge Planning: Goal: Ability to identify and utilize available resources and services will improve Outcome: Progressing Goal: Ability to manage health-related needs will improve Outcome: Progressing   Problem: Metabolic: Goal: Ability to maintain appropriate glucose levels will improve Outcome: Progressing   Problem: Nutritional: Goal: Maintenance of adequate nutrition will improve Outcome: Progressing Goal: Progress toward achieving an optimal weight will improve Outcome: Progressing   Problem: Skin Integrity: Goal: Risk for impaired skin integrity will decrease Outcome: Progressing   Problem: Tissue Perfusion: Goal: Adequacy of tissue perfusion will improve Outcome: Progressing   Problem: Education: Goal: Knowledge of General Education information will improve Description: Including pain rating scale, medication(s)/side effects and non-pharmacologic comfort measures Outcome: Progressing   Problem: Health Behavior/Discharge Planning: Goal: Ability to manage health-related needs will improve Outcome: Progressing   Problem: Clinical Measurements: Goal: Ability to maintain clinical measurements within normal limits will improve Outcome: Progressing Goal: Will remain free from infection Outcome: Progressing Goal: Diagnostic test results will improve Outcome: Progressing Goal: Respiratory complications will improve Outcome: Progressing Goal: Cardiovascular complication will  be avoided Outcome: Progressing   Problem: Activity: Goal: Risk for activity intolerance will decrease Outcome: Progressing   Problem: Nutrition: Goal: Adequate nutrition will be maintained Outcome: Progressing   Problem: Coping: Goal: Level of anxiety will decrease Outcome: Progressing   Problem: Elimination: Goal: Will not experience complications related to bowel motility Outcome: Progressing Goal: Will not experience complications related to urinary retention Outcome: Progressing   Problem: Pain Management: Goal: General experience of comfort will improve Outcome: Progressing   Problem: Safety: Goal: Ability to remain free from injury will improve Outcome: Progressing   Problem: Skin Integrity: Goal: Risk for impaired skin integrity will decrease Outcome: Progressing   Problem: Education: Goal: Knowledge of General Education information will improve Description: Including pain rating scale, medication(s)/side effects and non-pharmacologic comfort measures Outcome: Progressing   Problem: Health Behavior/Discharge Planning: Goal: Ability to manage health-related needs will improve Outcome: Progressing   Problem: Clinical Measurements: Goal: Ability to maintain clinical measurements within normal limits will improve Outcome: Progressing Goal: Will remain free from infection Outcome: Progressing Goal: Diagnostic test results will improve Outcome: Progressing Goal: Respiratory complications will improve Outcome: Progressing Goal: Cardiovascular complication will be avoided Outcome: Progressing   Problem: Activity: Goal: Risk for activity intolerance will decrease Outcome: Progressing   Problem: Nutrition: Goal: Adequate nutrition will be maintained Outcome: Progressing   Problem: Coping: Goal: Level of anxiety will decrease Outcome: Progressing   Problem: Elimination: Goal: Will not experience complications related to bowel motility Outcome:  Progressing Goal: Will not experience complications related to urinary retention Outcome: Progressing   Problem: Pain Management: Goal: General experience of comfort will improve Outcome: Progressing   Problem: Safety: Goal: Ability to remain free from injury will improve Outcome: Progressing   Problem: Skin Integrity: Goal: Risk for impaired skin integrity will decrease Outcome: Progressing

## 2023-03-08 NOTE — Evaluation (Signed)
 Clinical/Bedside Swallow Evaluation Patient Details  Name: [name] MRN: [id number] Date of Birth: [birth year]  Today's Date: 03/08/2023 Time: SLP Start Time (ACUTE ONLY): 1153 SLP Stop Time (ACUTE ONLY): 1210 SLP Time Calculation (min) (ACUTE ONLY): 17 min  Past Medical History:  Past Medical History:  Diagnosis Date   Acute coronary syndrome (HCC) 04/18/2019   Aortic stenosis 02/27/2020   mild to moderate AS   Arthritis    a little in LLE (06/20/2017)   Ataxia    Chronic diastolic CHF (congestive heart failure) (HCC)    Coronary artery disease    a. s/p CABG 1980s. b. stable by cath 05/2017.   Dizziness    Dyslipidemia    Edema 08/08/2017   HANDS & LOWER EXTREMITES   ESRD on hemodialysis (HCC)    Hypertension    Hypoalbuminemia    Nephrotic range proteinuria    Peripheral artery disease (HCC)    mild   Pneumonia ~ 1950 X 1   Prostate cancer (HCC) ~ 2015   Type II diabetes mellitus (HCC)    Past Surgical History:  Past Surgical History:  Procedure Laterality Date   CAPD REVISION N/A 06/29/2022   Procedure: REPLACEMENT OF PERITONEAL DIALYSIS CATHETER;  Surgeon: Magda Debby SAILOR, MD;  Location: [facility] OR;  Service: Vascular;  Laterality: N/A;   CARDIAC CATHETERIZATION     a couple times (06/20/2017)   CATARACT EXTRACTION W/ INTRAOCULAR LENS  IMPLANT, BILATERAL Bilateral    COLONOSCOPY     CORONARY ARTERY BYPASS GRAFT  03/06/1985   LAD and first diagonal/circumflex (sequential saphenous vein graft,cardioplegia   INGUINAL HERNIA REPAIR Right 12/06/2020   Procedure: OPEN RIGHT INGUINAL HERNIA REPAIR;  Surgeon: Gladis Cough, MD;  Location: [facility] ORS;  Service: General;  Laterality: Right;   INSERTION PROSTATE RADIATION SEED  ~ 2015   IR FLUORO GUIDE CV LINE RIGHT  06/26/2022   IR US  GUIDE VASC ACCESS RIGHT  06/26/2022   LAPAROSCOPIC LYSIS OF ADHESIONS N/A 06/29/2022   Procedure: LAPAROSCOPIC LYSIS OF ADHESIONS;  Surgeon: Magda Debby SAILOR, MD;  Location: [facility] OR;  Service:  Vascular;  Laterality: N/A;   LAPAROSCOPY N/A 06/29/2022   Procedure: LAPAROSCOPY DIAGNOSTIC;  Surgeon: Magda Debby SAILOR, MD;  Location: [facility] OR;  Service: Vascular;  Laterality: N/A;   LEFT HEART CATH AND CORS/GRAFTS ANGIOGRAPHY N/A 02/27/2023   Procedure: LEFT HEART CATH AND CORS/GRAFTS ANGIOGRAPHY;  Surgeon: Anner Alm ORN, MD;  Location: [facility] INVASIVE CV LAB;  Service: Cardiovascular;  Laterality: N/A;   NM MYOCAR PERF WALL MOTION  09/12/2006   no ischemia   REMOVAL OF A DIALYSIS CATHETER N/A 07/04/2022   Procedure: PERITONEAL CATHETER REMOVAL;  Surgeon: Lanis Fonda BRAVO, MD;  Location: [facility];  Service: Vascular;  Laterality: N/A;   RIGHT/LEFT HEART CATH AND CORONARY/GRAFT ANGIOGRAPHY N/A 06/21/2017   Procedure: RIGHT/LEFT HEART CATH AND CORONARY/GRAFT ANGIOGRAPHY;  Surgeon: Verlin Lonni BIRCH, MD;  Location: [facility] INVASIVE CV LAB;  Service: Cardiovascular;  Laterality: N/A;   US  ECHOCARDIOGRAPHY  11/04/2008   trace TR & MR   HPI:  [name] is an 89 yo male presenting to ED 1/8 with AMS, visual hallucinations, and multiple falls. CXR shows R upper lobe infiltrate, concerning for PNA. PMH includes ESRD on HD, T2DM, essential HTN, PAD    Assessment / Plan / Recommendation  Clinical Impression  Pt reports no significant history of PNA. His mental status appears significantly improved. Oral motor exam WFL. Pt had one delayed cough after a sip of water   that did not recur throughout any further trials of challenging sips via straw. Otherwise, pt had no s/s of dysphagia with purees or solids. Provided education regarding aspiration precautions. Recommend continuing current diet without further f/u. SLP to s/o at this time. SLP Visit Diagnosis: Dysphagia, unspecified (R13.10)    Aspiration Risk  Mild aspiration risk    Diet Recommendation Regular;Thin liquid    Liquid Administration via: Cup;Straw Medication Administration: Whole meds with liquid Supervision: Patient able to self  feed Compensations: Slow rate;Small sips/bites Postural Changes: Seated upright at 90 degrees    Other  Recommendations Oral Care Recommendations: Oral care BID    Recommendations for follow up therapy are one component of a multi-disciplinary discharge planning process, led by the attending physician.  Recommendations may be updated based on patient status, additional functional criteria and insurance authorization.  Follow up Recommendations No SLP follow up      Assistance Recommended at Discharge    Functional Status Assessment Patient has not had a recent decline in their functional status  Frequency and Duration            Prognosis Prognosis for improved oropharyngeal function: Good Barriers to Reach Goals: Cognitive deficits      Swallow Study   General HPI: [name] is an 89 yo male presenting to ED 1/8 with AMS, visual hallucinations, and multiple falls. CXR shows R upper lobe infiltrate, concerning for PNA. PMH includes ESRD on HD, T2DM, essential HTN, PAD Type of Study: Bedside Swallow Evaluation Previous Swallow Assessment: none in chart Temperature Spikes Noted: Yes Respiratory Status: Room air History of Recent Intubation: No Behavior/Cognition: Alert;Cooperative;Pleasant mood Oral Cavity Assessment: Within Functional Limits Oral Care Completed by SLP: No Oral Cavity - Dentition: Adequate natural dentition Vision: Functional for self-feeding Self-Feeding Abilities: Able to feed self Patient Positioning: Upright in bed Baseline Vocal Quality: Normal Volitional Cough: Congested Volitional Swallow: Able to elicit    Oral/Motor/Sensory Function Overall Oral Motor/Sensory Function: Within functional limits   Ice Chips Ice chips: Not tested   Thin Liquid Thin Liquid: Impaired Presentation: Straw;Self Fed Pharyngeal  Phase Impairments: Cough - Delayed    Nectar Thick Nectar Thick Liquid: Not tested   Honey Thick Honey Thick Liquid: Not tested   Puree  Puree: Within functional limits Presentation: Spoon;Self Fed   Solid     Solid: Within functional limits Presentation: Self Fed      Damien Blumenthal, M.A., CF-SLP Speech Language Pathology, Acute Rehabilitation Services  Secure Chat preferred [phone]  03/08/2023,1:05 PM

## 2023-03-08 NOTE — Progress Notes (Signed)
 [facility] Progress Note   Subjective:   Seen during HD this AM. He was pleasant and alert this morning. His temp is back to normal today. He denies and overnight fever or chills overnight. He denies any abdominal pain this morning. He denies and CP, dyspnea, and SOB this am.   Objective Vitals:   03/08/23 0830 03/08/23 0900 03/08/23 0930 03/08/23 1000  BP: (!) 124/59 (!) 117/55 (!) 118/54 (!) 113/56  Pulse: 63 61 63 62  Resp: 13 17 20 19   Temp:      TempSrc:      SpO2: 99% 100% 97% 98%  Weight:       Physical Exam General:pleasant elderly man, alert and responsive to my questions Heart: RRR, no murmurs Lungs: CTA, no rales or wheezing, room air this am Abdomen: no abdominal pain, non-distended, non-tender Extremities: no LE or UE edema Dialysis Access: R[facility]  Additional Objective Labs: Basic Metabolic Panel: Recent Labs  Lab 03/07/23 0745 03/07/23 1550 03/08/23 0529  NA 140  --  139  K 5.1  --  5.0  CL 96*  --  99  CO2 25  --  24  GLUCOSE 158*  --  129*  BUN 92*  --  93*  CREATININE 11.55* 11.93* 12.53*  CALCIUM  9.0  --  9.1  PHOS  --   --  4.2   Liver Function Tests: Recent Labs  Lab 03/07/23 0745  AST 27  ALT 19  ALKPHOS 50  BILITOT 0.7  PROT 6.0*  ALBUMIN 2.7*    CBC: Recent Labs  Lab 03/07/23 0427 03/07/23 1550 03/08/23 0529  WBC 9.5 10.4 10.0  NEUTROABS 8.0*  --   --   HGB 9.5* 9.6* 9.2*  HCT 29.2* 29.8* 28.1*  MCV 101.7* 103.8* 100.7*  PLT 219 209 229    CBG: Recent Labs  Lab 03/07/23 1728 03/07/23 2023 03/08/23 0620  GLUCAP 106* 191* 106*   Medications:  ampicillin -sulbactam (UNASYN ) IV Stopped (03/07/23 1621)   anticoagulant sodium citrate      azithromycin  (ZITHROMAX ) 500 mg in sodium chloride  0.9 % 250 mL IVPB      (feeding supplement) PROSource Plus  30 mL Oral QODAY   aspirin  EC  81 mg Oral Daily   atorvastatin   80 mg Oral Daily   Chlorhexidine  Gluconate Cloth  6 each Topical Q0600   heparin   5,000  Units Subcutaneous Q8H   insulin  aspart  0-5 Units Subcutaneous QHS   insulin  aspart  0-9 Units Subcutaneous TID WC   pantoprazole  (PROTONIX ) IV  40 mg Intravenous Q24H   polyvinyl alcohol   1 drop Both Eyes Daily   senna-docusate  2 tablet Oral BID   sevelamer  carbonate  2,400 mg Oral BID   sodium chloride  flush  3 mL Intravenous Q12H    Dialysis Orders   TTS [facility]   3.5h   400/800   67kg  3K/ 2.5 Ca bath   TDC RIJ  Heparin  none  - coming off 2 kg over last 2 wks - mircera 75 q 4 wks, last 12/14, due 1/11 - rocaltrol 0.75 mcg - sensipar 60mg   Assessment/Plan: CAP- chest x-ray showed opacity in RUL concerning for PNA. On IV antibiotics. Altered mental status: more alert this AM. Could be from small PNA or missed HD. ESRD: HD today. Continue with regular HD schedule HTN/volume: No UE or LE edema today. Above EDW today. AIM for current dry weight Anemia of ESRD: Hgb 9.2. Aranesp  due 1/11; ordered today  Secondary HPTH: Corrected Ca and Phos good. Continue sevelamer . Nutrition: Albumin low. Continue supplement T2DM: continue with SSI   Belvie Och, AGNP 03/08/2023, 10:43 AM   [facility]

## 2023-03-08 NOTE — Progress Notes (Signed)
 Presents with fall,AMS/ sepsis, hx of CAD, ESRD/TThS.   03/08/23 1244  TOC Brief Assessment  Insurance and Status Reviewed  Patient has primary care physician Yes  Home environment has been reviewed From home with wife.  Prior level of function: PTA independent with ADL's. DME: RW and cane @ home  Prior/Current Home Services No current home services  Social Drivers of Health Review SDOH reviewed no interventions necessary  Readmission risk has been reviewed No  Transition of care needs transition of care needs identified, TOC will continue to follow   Jon Hoit RN,BSN,CM [phone]

## 2023-03-09 DIAGNOSIS — J189 Pneumonia, unspecified organism: Secondary | ICD-10-CM | POA: Diagnosis not present

## 2023-03-09 LAB — GLUCOSE, CAPILLARY
Glucose-Capillary: 141 mg/dL — ABNORMAL HIGH (ref 70–99)
Glucose-Capillary: 154 mg/dL — ABNORMAL HIGH (ref 70–99)
Glucose-Capillary: 129 mg/dL — ABNORMAL HIGH (ref 70–99)
Glucose-Capillary: 149 mg/dL — ABNORMAL HIGH (ref 70–99)

## 2023-03-09 MED ORDER — PANTOPRAZOLE SODIUM 40 MG PO TBEC
40.0000 mg | DELAYED_RELEASE_TABLET | Freq: Every day | ORAL | Status: DC
  Administered 2023-03-09 – 2023-03-10 (×2): 40 mg via ORAL
  Filled 2023-03-09 (×2): qty 1

## 2023-03-09 MED ORDER — CARVEDILOL 3.125 MG PO TABS
3.1250 mg | ORAL_TABLET | Freq: Two times a day (BID) | ORAL | Status: DC
  Administered 2023-03-09: 3.125 mg via ORAL
  Filled 2023-03-09 (×2): qty 1

## 2023-03-09 MED ORDER — AZITHROMYCIN 250 MG PO TABS
500.0000 mg | ORAL_TABLET | Freq: Every day | ORAL | Status: AC
  Administered 2023-03-09 – 2023-03-10 (×2): 500 mg via ORAL
  Filled 2023-03-09 (×2): qty 2

## 2023-03-09 MED ORDER — AMOXICILLIN-POT CLAVULANATE 500-125 MG PO TABS
1.0000 | ORAL_TABLET | ORAL | Status: DC
  Administered 2023-03-09: 1 via ORAL
  Filled 2023-03-09 (×2): qty 1

## 2023-03-09 NOTE — Plan of Care (Signed)
  Problem: Education: Goal: Ability to describe self-care measures that may prevent or decrease complications (Diabetes Survival Skills Education) will improve Outcome: Progressing Goal: Individualized Educational Video(s) Outcome: Progressing   Problem: Coping: Goal: Ability to adjust to condition or change in health will improve Outcome: Progressing   Problem: Fluid Volume: Goal: Ability to maintain a balanced intake and output will improve Outcome: Progressing   Problem: Health Behavior/Discharge Planning: Goal: Ability to identify and utilize available resources and services will improve Outcome: Progressing Goal: Ability to manage health-related needs will improve Outcome: Progressing   Problem: Metabolic: Goal: Ability to maintain appropriate glucose levels will improve Outcome: Progressing   Problem: Nutritional: Goal: Maintenance of adequate nutrition will improve Outcome: Progressing Goal: Progress toward achieving an optimal weight will improve Outcome: Progressing   Problem: Skin Integrity: Goal: Risk for impaired skin integrity will decrease Outcome: Progressing   Problem: Tissue Perfusion: Goal: Adequacy of tissue perfusion will improve Outcome: Progressing   Problem: Education: Goal: Knowledge of General Education information will improve Description: Including pain rating scale, medication(s)/side effects and non-pharmacologic comfort measures Outcome: Progressing   Problem: Health Behavior/Discharge Planning: Goal: Ability to manage health-related needs will improve Outcome: Progressing   Problem: Clinical Measurements: Goal: Ability to maintain clinical measurements within normal limits will improve Outcome: Progressing Goal: Will remain free from infection Outcome: Progressing Goal: Diagnostic test results will improve Outcome: Progressing Goal: Respiratory complications will improve Outcome: Progressing Goal: Cardiovascular complication will  be avoided Outcome: Progressing   Problem: Activity: Goal: Risk for activity intolerance will decrease Outcome: Progressing   Problem: Nutrition: Goal: Adequate nutrition will be maintained Outcome: Progressing   Problem: Coping: Goal: Level of anxiety will decrease Outcome: Progressing   Problem: Elimination: Goal: Will not experience complications related to bowel motility Outcome: Progressing Goal: Will not experience complications related to urinary retention Outcome: Progressing   Problem: Pain Management: Goal: General experience of comfort will improve Outcome: Progressing   Problem: Safety: Goal: Ability to remain free from injury will improve Outcome: Progressing   Problem: Skin Integrity: Goal: Risk for impaired skin integrity will decrease Outcome: Progressing   Problem: Education: Goal: Knowledge of General Education information will improve Description: Including pain rating scale, medication(s)/side effects and non-pharmacologic comfort measures Outcome: Progressing   Problem: Health Behavior/Discharge Planning: Goal: Ability to manage health-related needs will improve Outcome: Progressing   Problem: Clinical Measurements: Goal: Ability to maintain clinical measurements within normal limits will improve Outcome: Progressing Goal: Will remain free from infection Outcome: Progressing Goal: Diagnostic test results will improve Outcome: Progressing Goal: Respiratory complications will improve Outcome: Progressing Goal: Cardiovascular complication will be avoided Outcome: Progressing   Problem: Activity: Goal: Risk for activity intolerance will decrease Outcome: Progressing   Problem: Nutrition: Goal: Adequate nutrition will be maintained Outcome: Progressing   Problem: Coping: Goal: Level of anxiety will decrease Outcome: Progressing   Problem: Elimination: Goal: Will not experience complications related to bowel motility Outcome:  Progressing Goal: Will not experience complications related to urinary retention Outcome: Progressing   Problem: Pain Management: Goal: General experience of comfort will improve Outcome: Progressing   Problem: Safety: Goal: Ability to remain free from injury will improve Outcome: Progressing   Problem: Skin Integrity: Goal: Risk for impaired skin integrity will decrease Outcome: Progressing
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
## 2023-03-09 NOTE — Care Management Important Message (Signed)
 Important Message  Patient Details  Name: [name] MRN: [id number] Date of Birth: [birth year]   Important Message Given:  Yes - Medicare IM     Dorena Bodo 03/09/2023, 2:39 PM

## 2023-03-09 NOTE — Progress Notes (Addendum)
 Mobility Specialist: Progress Note   03/09/23 1129  Mobility  Activity Ambulated with assistance in hallway  Level of Assistance Contact guard assist, steadying assist  Assistive Device Front wheel walker  Distance Ambulated (ft) 600 ft  Activity Response Tolerated well  Mobility Referral Yes  Mobility visit 1 Mobility  Mobility Specialist Start Time (ACUTE ONLY) 1046  Mobility Specialist Stop Time (ACUTE ONLY) 1110  Mobility Specialist Time Calculation (min) (ACUTE ONLY) 24 min    Pt was agreeable to mobility session - received in bed. MinG for STS, initially CG for ambulation but progressed to SV. No complaints just feeling a little sore from previous falls. Returned to room without fault. Left on EOB with all needs met, call bell in reach. Wife in room.   Ileana Lute Mobility Specialist Please contact via SecureChat or Rehab office at [phone]
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
## 2023-03-10 DIAGNOSIS — J189 Pneumonia, unspecified organism: Secondary | ICD-10-CM | POA: Diagnosis not present

## 2023-03-10 LAB — GLUCOSE, CAPILLARY: Glucose-Capillary: 111 mg/dL — ABNORMAL HIGH (ref 70–99)

## 2023-03-10 MED ORDER — AMOXICILLIN-POT CLAVULANATE 500-125 MG PO TABS: 1.0000 | ORAL_TABLET | ORAL | 0 refills | Status: AC

## 2023-03-10 MED ORDER — FUROSEMIDE 80 MG PO TABS: 80.0000 mg | ORAL_TABLET | Freq: Every day | ORAL | Status: DC | PRN

## 2023-03-10 NOTE — Discharge Planning (Signed)
 [facility] Patient Discharge Orders- [facility] CLINIC: [facility]  Patient's name: [name] Admit/DC Dates: 03/07/2023 - 03/10/2023  Discharge Diagnoses: Multifocal PNA - suspect aspiration PNA  AMS - likely 2/2 #1  Aranesp : Given: no Last Hgb: 9.2 PRBC's Given: no ESA dose for discharge: mircera 75 mcg IV q 2 weeks - due now  IV Iron  dose at discharge: none  Heparin  change: no  EDW Change: yes- 66.5kg   Bath Change: no  Access intervention/Change: no Details:  Hectorol/Calcitriol change: no  Discharge Labs: Calcium  9.1 Phosphorus 4.2 Albumin 2.7 K+ 5.0  IV Antibiotics: no Details: Augmentin  500mg  cap - 1 PO every day after dialysis x5 days  On Coumadin?: no Last INR: Next INR: Managed By:   OTHER/APPTS/LAB ORDERS:    D/C Meds to be reconciled by nurse after every discharge.  Completed By: Manuelita Labella, PA-C   Reviewed by: MD:______ RN_______

## 2023-03-10 NOTE — Progress Notes (Signed)
   03/10/23 1230  Vitals  Temp 98.1 F (36.7 C)  Pulse Rate 68  Resp (!) 23  BP (!) 135/59  SpO2 99 %  Weight 65.9 kg  Type of Weight Post-Dialysis  Post Treatment  Dialyzer Clearance Lightly streaked  Hemodialysis Intake (mL) 0 mL  Liters Processed 54  Fluid Removed (mL) 1000 mL  Tolerated HD Treatment Yes   Received patient in bed to unit.  Alert and oriented.  Informed consent signed and in chart.   TX duration: 3 hrs  Patient tolerated well.  Transported back to the room  Alert, without acute distress.  Hand-off given to patient's nurse.   Access used: [facility] Access issues: none  Total UF removed: 1L Medication(s) given: none    [name]

## 2023-03-10 NOTE — Progress Notes (Signed)
 [facility] Progress Note   Subjective:   Patient seen and examined in dialysis. Tolerating treatment well so far.  Denies CP, abdominal pain and n/v/d.  States SOB is the same as usual.  Per patient he is to d/c home today.   Objective Vitals:   03/10/23 0930 03/10/23 1000 03/10/23 1030 03/10/23 1100  BP: (!) 110/50 (!) 131/57 134/60 126/66  Pulse: (!) 58 (!) 54 65 66  Resp: 19 15 17 15   Temp:      TempSrc:      SpO2: 95% 97% 100% 100%  Weight:       Physical Exam General:pleasant, alert, elderly man in NAD  Heart:3/6 holosystolic murmur Lungs:CTAB, nml WOB on RA Abdomen:soft, NTND Extremities:no LE edema Dialysis Access: [facility]   Filed Weights   03/08/23 0746 03/08/23 1120 03/10/23 0850  Weight: 72.7 kg 66.9 kg 66.9 kg    Intake/Output Summary (Last 24 hours) at 03/10/2023 1126 Last data filed at 03/09/2023 1400 Gross per 24 hour  Intake 120 ml  Output --  Net 120 ml    Additional Objective Labs: Basic Metabolic Panel: Recent Labs  Lab 03/07/23 0745 03/07/23 1550 03/08/23 0529  NA 140  --  139  K 5.1  --  5.0  CL 96*  --  99  CO2 25  --  24  GLUCOSE 158*  --  129*  BUN 92*  --  93*  CREATININE 11.55* 11.93* 12.53*  CALCIUM  9.0  --  9.1  PHOS  --   --  4.2   Liver Function Tests: Recent Labs  Lab 03/07/23 0745  AST 27  ALT 19  ALKPHOS 50  BILITOT 0.7  PROT 6.0*  ALBUMIN 2.7*   CBC: Recent Labs  Lab 03/07/23 0427 03/07/23 1550 03/08/23 0529  WBC 9.5 10.4 10.0  NEUTROABS 8.0*  --   --   HGB 9.5* 9.6* 9.2*  HCT 29.2* 29.8* 28.1*  MCV 101.7* 103.8* 100.7*  PLT 219 209 229    Medications:   (feeding supplement) PROSource Plus  30 mL Oral QODAY   amoxicillin -clavulanate  1 tablet Oral Q24H   aspirin  EC  81 mg Oral Daily   atorvastatin   80 mg Oral Daily   azithromycin   500 mg Oral Daily   carvedilol   3.125 mg Oral BID WC   Chlorhexidine  Gluconate Cloth  6 each Topical Q0600   darbepoetin (ARANESP ) injection - DIALYSIS   100 mcg Subcutaneous Q Sat-1800   heparin   5,000 Units Subcutaneous Q8H   insulin  aspart  0-5 Units Subcutaneous QHS   insulin  aspart  0-9 Units Subcutaneous TID WC   pantoprazole   40 mg Oral Daily   polyvinyl alcohol   1 drop Both Eyes Daily   senna-docusate  2 tablet Oral BID   sevelamer  carbonate  2,400 mg Oral BID   sodium chloride  flush  3 mL Intravenous Q12H    Dialysis Orders: TTS [facility]   3.5h   400/800   67kg  3K/ 2.5 Ca bath   TDC RIJ  Heparin  none  - coming off 2 kg over last 2 wks - mircera 75 q 4 wks, last 12/14, due 1/11 - rocaltrol 0.75 mcg - sensipar 60mg    Assessment/Plan: CAP: chest x-ray showed opacity in RUL concerning for PNA. Completed IV antibiotics. Augmentin  started yesterday.  ESRD: Usual TTS schedule - HD today per regular schedule.  HTN/volume: BP stable. A little under edw, may need to be lowered as outpatient.  Anemia of  ESRD: Hgb 9.2 today. Aranesp  ordered for today.  Secondary HPTH: Corrected Ca and phos in goal. Continue sevelamer , sensipar and calcitriol.  Nutrition: Albumin low. Continue with supplement.  T2DM: Per PMD. Dispo: Ok for d/c from renal standpoint.  Manuelita Labella, PA-C [facility] 03/10/2023,11:26 AM  LOS: 3 days

## 2023-03-10 NOTE — Discharge Summary (Signed)
 Physician Discharge Summary  [name] FMW:[id number] DOB: [birth year] DOA: 03/07/2023  PCP: Corlis Pagan, NP  Admit date: 03/07/2023 Discharge date: 03/10/2023  Admitted From: Home Discharge disposition: Home  Recommendations at discharge:  Complete the course of antibiotics with 5 more days of Augmentin . Lasix  has been switched from MWF to PRN weekly  Brief narrative: [name] is a 89 y.o. male with PMH significant for ESRD-HD-TTS, DM2, HTN, HLD, CAD/CABG, mild to moderate aortic stenosis, PAD, prostate cancer. At baseline, physical independent but has cognitive issues, lives with wife at home who manages his financial affairs and complex transactions. 1/8, patient presented to the ED with complaint of generalized fatigue, falls.  Symptoms started 3 days ago. Last HD was on Jan 4th. HD on Jan 7 was cancelled due to a fall patient suffered at home and could not reach the unit on time.  EMS was called after a fall on 1/8 and patient was brought to the ED.  In the ED, patient had a temperature 100.4 Chest x-ray showed right upper lobe infiltrates. CT abdomen and pelvis was also obtained which suggested bilateral airspace opacities, right more than left.  Also suggested large colonic stool burden. Blood culture collected Patient was started on IV Rocephin , azithromycin  for pneumonia Admitted to [facility] Nephrology consulted for maintenance dialysis  Subjective: Patient was seen and examined this morning. Pleasant, elderly African-American male.   Seen on dialysis unit.  Not in distress.  Not on supplemental oxygen.  I met with patient's wife later in the unit. Patient and his wife are confident to drive home today.  Hospital course: Multifocal pneumonia Suspect aspiration pneumonia Initially given on IV Rocephin  and azithromycin  in the ED. Was switched to IV Unasyn  on admission Speech therapy consulted for swallow eval WBC count normal, lactic acid not elevated. Clinically  improved.  Switched from Unasyn  to Augmentin  today..  Continue for next 5 days postdischarge Recent Labs  Lab 03/07/23 0427 03/07/23 0648 03/07/23 1550 03/08/23 0529  WBC 9.5  --  10.4 10.0  LATICACIDVEN  --  1.3  --   --    Acute metabolic encephalopathy Underlying cognitive dysfunction Brought in for altered mental status, weakness, fall.  Patient also missed 1 set of dialysis prior to presentation  CT head unremarkable Mental status gradually and back to baseline.   ESRD-HD-TTS Nephrology consult appreciated.  Underwent dialysis this morning.  Next dialysis on schedule Tuesday.  Type 2 diabetes mellitus A1c 6.1 on 03/07/2023 PTA meds-on Lantus  as needed?? Currently blood sugar level is in range without requiring insulin .. Recent Labs  Lab 03/09/23 0622 03/09/23 1153 03/09/23 1638 03/09/23 2045 03/10/23 1342  GLUCAP 141* 154* 129* 149* 111*   Essential hypertension PTA meds- Coreg  3.125 mg twice daily, Lasix  80 mg daily MWF Currently blood pressure is controlled on Coreg  3.25 mg twice daily.  Lasix  is on hold.  Per nephrology, volume status is already under estimated dry weight.  At discharge, I would resume Lasix  as PRN only.   PAD Chronic. No evidence of ishcemia limb threatening at this time.  Continue aspirin , lipitor     Mobility: Encourage ambulation  Goals of care   Code Status: Do not attempt resuscitation (DNR) PRE-ARREST INTERVENTIONS DESIRED   Diet:  Diet Order             Diet general           Diet Carb Modified Fluid consistency: Thin; Room service appropriate? Yes  Diet effective now  Nutritional status:  Body mass index is 20.26 kg/m.       Wounds:  - Incision - 3 Ports Abdomen Right;Mid Right;Mid Right;Lower (Active)  Placement Date/Time: 06/29/22 1038   Location of Ports: Abdomen  Location Orientation: Right;Mid  Location Orientation: Right;Mid  Location Orientation: Right;Lower    Assessments 06/29/2022 11:18 AM  07/04/2022  9:58 AM  Port 1 Site Assessment Clean;Dry Clean;Dry  Port 1 Margins Attached edges (approximated) --  Port 1 Drainage Amount None --  Port 1 Dressing Type Liquid skin adhesive --  Port 1 Dressing Status Clean, Dry, Intact --  Port 2 Site Assessment Clean;Dry Clean;Dry  Port 2 Margins Attached edges (approximated) --  Port 2 Drainage Amount None --  Port 2 Dressing Type Liquid skin adhesive --  Port 2 Dressing Status Clean, Dry, Intact --  Port 3 Site Assessment Clean;Dry Clean;Dry  Port 3 Margins Attached edges (approximated) --  Port 3 Drainage Amount None --  Port 3 Dressing Type Liquid skin adhesive --  Port 3 Dressing Status Clean, Dry, Intact --     No associated orders.    Discharge Exam:   Vitals:   03/10/23 1200 03/10/23 1225 03/10/23 1230 03/10/23 1300  BP: 130/61 139/64 (!) 135/59 (!) 120/53  Pulse: 66 69 68 62  Resp: 19 (!) 23 (!) 23 19  Temp:   98.1 F (36.7 C) 98.7 F (37.1 C)  TempSrc:    Oral  SpO2: 99% 99% 99% 98%  Weight:   65.9 kg     Body mass index is 20.26 kg/m.  General exam: Pleasant, elderly African-American male. Skin: No rashes, lesions or ulcers. HEENT: Atraumatic, normocephalic, no obvious bleeding Lungs: Clear to auscultation bilaterally,  CVS: S1, S2, no murmur,   GI/Abd: Soft, nontender, nondistended, bowel sound present. CNS: Alert, awake, oriented x 3 Psychiatry: Mood appropriate Extremities: No pedal edema, no calf tenderness  Follow ups:    Follow-up Information     Corlis Pagan, NP Follow up.   Contact information: [address] [place] [address] [place] [address] [phone]                 Discharge Instructions:   Discharge Instructions     Call MD for:  difficulty breathing, headache or visual disturbances   Complete by: As directed    Call MD for:  extreme fatigue   Complete by: As directed    Call MD for:  hives   Complete by: As directed    Call MD for:  persistant dizziness or  light-headedness   Complete by: As directed    Call MD for:  persistant nausea and vomiting   Complete by: As directed    Call MD for:  severe uncontrolled pain   Complete by: As directed    Call MD for:  temperature >100.4   Complete by: As directed    Diet general   Complete by: As directed    Renal diet   Increase activity slowly   Complete by: As directed        Discharge Medications:   Allergies as of 03/10/2023       Reactions   Clonidine  Hcl    Other reaction(s): constipation        Medication List     TAKE these medications    (feeding supplement) PROSource Plus liquid Take 30 mLs by mouth 2 (two) times daily between meals. What changed:  when to take this additional instructions   amoxicillin -clavulanate 500-125 MG tablet  Commonly known as: AUGMENTIN  Take 1 tablet by mouth daily for 5 days.   aspirin  EC 81 MG tablet Take 81 mg by mouth daily.   atorvastatin  80 MG tablet Commonly known as: LIPITOR  TAKE 1 TABLET BY MOUTH EVERY DAY   BEANO PO Take 1 tablet by mouth daily as needed (Gas).   carvedilol  3.125 MG tablet Commonly known as: COREG  TAKE 1 TABLET BY MOUTH TWICE A DAY WITH FOOD   diphenhydramine -acetaminophen  25-500 MG Tabs tablet Commonly known as: TYLENOL  PM Take 1 tablet by mouth at bedtime as needed (Sleep).   famotidine  10 MG tablet Commonly known as: PEPCID  Take 10 mg by mouth daily as needed for heartburn or indigestion.   fexofenadine 180 MG tablet Commonly known as: ALLEGRA Take 180 mg by mouth daily as needed for allergies or rhinitis.   folic acid  800 MCG tablet Commonly known as: FOLVITE  Take 400 mcg by mouth daily.   furosemide  80 MG tablet Commonly known as: LASIX  Take 1 tablet (80 mg total) by mouth daily as needed. What changed:  when to take this reasons to take this   gentamicin  cream 0.1 % Commonly known as: GARAMYCIN  Apply 1 Application topically daily as needed (Rash).   Gold [employer] Ultimate Healing  Crea Apply 1 Application topically daily as needed (Moisturizer).   Hydrocortisone Acetate 1 % Crea Apply 1 application  topically daily as needed (Itching).   insulin  glargine 100 UNIT/ML injection Commonly known as: LANTUS  Inject 10 Units into the skin daily as needed (high blood glucose).   melatonin 1 MG Tabs tablet Take 1 mg by mouth at bedtime as needed (Sleep).   nitroGLYCERIN  0.4 MG SL tablet Commonly known as: NITROSTAT  Place 1 tablet (0.4 mg total) under the tongue every 5 (five) minutes x 3 doses as needed for chest pain.   OVER THE COUNTER MEDICATION Place 1 spray into both nostrils daily as needed (Congestion). mucinex saline spray   polyethylene glycol 17 g packet Commonly known as: MIRALAX  / GLYCOLAX  Take 17 g by mouth daily as needed for mild constipation.   sevelamer  carbonate 800 MG tablet Commonly known as: RENVELA  Take 2,400 mg by mouth 2 (two) times daily.   Systane Complete PF 0.6 % Soln Generic drug: Propylene Glycol (PF) Place 1 drop into both eyes daily.   Vitamin B-12 5000 MCG Tbdp Take 5,000 Units by mouth daily.         The results of significant diagnostics from this hospitalization (including imaging, microbiology, ancillary and laboratory) are listed below for reference.    Procedures and Diagnostic Studies:   CT ABDOMEN PELVIS W CONTRAST Result Date: 03/07/2023 CLINICAL DATA:  Right lower quadrant pain. Fever. Prostate carcinoma. * Tracking Code: BO * EXAM: CT ABDOMEN AND PELVIS WITH CONTRAST TECHNIQUE: Multidetector CT imaging of the abdomen and pelvis was performed using the standard protocol following bolus administration of intravenous contrast. RADIATION DOSE REDUCTION: This exam was performed according to the departmental dose-optimization program which includes automated exposure control, adjustment of the mA and/or kV according to patient size and/or use of iterative reconstruction technique. CONTRAST:  65mL OMNIPAQUE  IOHEXOL  350  MG/ML SOLN COMPARISON:  06/23/2022 FINDINGS: Lower Chest: Pulmonary consolidation seen in the right lower lobe with occlusion of several central right lower lobe bronchi, possibly due to aspiration. Patchy airspace disease also noted left lower lobe. Hepatobiliary: No suspicious hepatic masses identified. Gallbladder is unremarkable. No evidence of biliary ductal dilatation. Pancreas:  No mass or inflammatory changes. Spleen: Within normal limits  in size and appearance. Adrenals/Urinary Tract: No suspicious masses identified. 2 mm calculus in lower pole of right kidney. No evidence of ureteral calculi or hydronephrosis. Stomach/Bowel: No evidence of obstruction, inflammatory process or abnormal fluid collections. Normal appendix visualized. Large stool burden noted throughout the colon. Vascular/Lymphatic: No pathologically enlarged lymph nodes. No acute vascular findings. Reproductive:  No mass or other significant abnormality. Other:  Near-complete resolution of ascites since prior study. Musculoskeletal:  No suspicious bone lesions identified. IMPRESSION: Right greater than left lower lobe airspace disease with occlusion of several central right lower lobe bronchi, raising suspicion for aspiration pneumonia. No evidence of appendicitis or other acute findings within the abdomen or pelvis. Near-complete resolution of ascites since prior study. Large colonic stool burden noted; recommend clinical correlation for possible constipation. Tiny right renal calculus. No evidence of ureteral calculi or hydronephrosis. Electronically Signed   By: Norleen DELENA Kil M.D.   On: 03/07/2023 11:38   DG Chest Port 1 View Result Date: 03/07/2023 CLINICAL DATA:  questionable sepsis, with altered mentation and fever. Dialysis patient did not go to the last appointment. States painful urination. EXAM: PORTABLE CHEST 1 VIEW COMPARISON:  AP chest 06/22/2022. FINDINGS: 4:49 a.m. There is a new right IJ dialysis line with the tip at the  superior cavoatrial junction. No pneumothorax is seen. There is increased opacity in the suprahilar right upper lobe concerning for a small pneumonia. Remaining lungs clear apart from chronic change in the bases. No pleural effusion is seen. The heart is slightly enlarged. There are old CABG changes. No evidence of CHF. Osteopenia and thoracic spondylosis. No acute osseous findings. Postsurgical change EG junction. IMPRESSION: 1. Increased opacity in the suprahilar right upper lobe concerning for a small pneumonia. 2. New right IJ dialysis line with the tip at the superior cavoatrial junction. No pneumothorax. 3. Mild cardiomegaly. Electronically Signed   By: Francis Quam M.D.   On: 03/07/2023 06:00   CT HEAD WO CONTRAST ( ) Result Date: 03/07/2023 CLINICAL DATA:  89 year old male status post falls at home. Altered mental status. End stage renal disease. EXAM: CT HEAD WITHOUT CONTRAST TECHNIQUE: Contiguous axial images were obtained from the base of the skull through the vertex without intravenous contrast. RADIATION DOSE REDUCTION: This exam was performed according to the departmental dose-optimization program which includes automated exposure control, adjustment of the mA and/or kV according to patient size and/or use of iterative reconstruction technique. COMPARISON:  Brain MRI 08/27/2021.  Head CT 12/20/2004. FINDINGS: Brain: Scattered dural calcification. Cerebral volume stable from the 2023 MRI. No midline shift, mass effect, or evidence of intracranial mass lesion. No ventriculomegaly. No acute intracranial hemorrhage identified. No cortically based acute infarct identified. Patchy and confluent bilateral white matter hypodensity stable from the MRI appearance. No cortical encephalomalacia identified. Vascular: Extensive Calcified atherosclerosis at the skull base. No suspicious intracranial vascular hyperdensity. Skull: No fracture identified. Sinuses/Orbits: Chronic left maxillary sinusitis. Other  Visualized paranasal sinuses and mastoids are stable and well aerated. Other: Calcified scalp vessel atherosclerosis. Postoperative changes to the globes. No discrete orbit or scalp soft tissue injury identified. IMPRESSION: 1. No acute intracranial abnormality or acute traumatic injury identified. 2. Chronic cerebral white matter disease stable from 2023 MRI. 3. Advanced calcified atherosclerosis. Chronic left maxillary sinusitis. Electronically Signed   By: VEAR Hurst M.D.   On: 03/07/2023 05:37     Labs:   Basic Metabolic Panel: Recent Labs  Lab 03/07/23 0745 03/07/23 1550 03/08/23 0529  NA 140  --  139  K  5.1  --  5.0  CL 96*  --  99  CO2 25  --  24  GLUCOSE 158*  --  129*  BUN 92*  --  93*  CREATININE 11.55* 11.93* 12.53*  CALCIUM  9.0  --  9.1  PHOS  --   --  4.2   GFR Estimated Creatinine Clearance: 3.9 mL/min (A) (by C-G formula based on SCr of 12.53 mg/dL (H)). Liver Function Tests: Recent Labs  Lab 03/07/23 0745  AST 27  ALT 19  ALKPHOS 50  BILITOT 0.7  PROT 6.0*  ALBUMIN 2.7*   No results for input(s): LIPASE, AMYLASE in the last 168 hours. No results for input(s): AMMONIA in the last 168 hours. Coagulation profile Recent Labs  Lab 03/07/23 0427 03/08/23 0529  INR 1.2 1.2    CBC: Recent Labs  Lab 03/07/23 0427 03/07/23 1550 03/08/23 0529  WBC 9.5 10.4 10.0  NEUTROABS 8.0*  --   --   HGB 9.5* 9.6* 9.2*  HCT 29.2* 29.8* 28.1*  MCV 101.7* 103.8* 100.7*  PLT 219 209 229   Cardiac Enzymes: No results for input(s): CKTOTAL, CKMB, CKMBINDEX, TROPONINI in the last 168 hours. BNP: Invalid input(s): POCBNP CBG: Recent Labs  Lab 03/09/23 0622 03/09/23 1153 03/09/23 1638 03/09/23 2045 03/10/23 1342  GLUCAP 141* 154* 129* 149* 111*   D-Dimer No results for input(s): DDIMER in the last 72 hours. Hgb A1c Recent Labs    03/07/23 1550  HGBA1C 6.1*   Lipid Profile No results for input(s): CHOL, HDL, LDLCALC, TRIG,  CHOLHDL, LDLDIRECT in the last 72 hours. Thyroid  function studies Recent Labs    03/08/23 0529  TSH 5.417*   Anemia work up Recent Labs    03/08/23 0529  VITAMINB12 >7,500*   Microbiology Recent Results (from the past 240 hours)  Resp panel by RT-PCR (RSV, Flu A&B, Covid) Anterior Nasal Swab     Status: None   Collection Time: 03/07/23  6:35 AM   Specimen: Anterior Nasal Swab  Result Value Ref Range Status   SARS Coronavirus 2 by RT PCR NEGATIVE NEGATIVE Final   Influenza A by PCR NEGATIVE NEGATIVE Final   Influenza B by PCR NEGATIVE NEGATIVE Final    Comment: (NOTE) The Xpert Xpress SARS-CoV-2/FLU/RSV plus assay is intended as an aid in the diagnosis of influenza from Nasopharyngeal swab specimens and should not be used as a sole basis for treatment. Nasal washings and aspirates are unacceptable for Xpert Xpress SARS-CoV-2/FLU/RSV testing.  Fact Sheet for Patients: bloggercourse.com  Fact Sheet for Healthcare Providers: seriousbroker.it  This test is not yet approved or cleared by the [place]  FDA and has been authorized for detection and/or diagnosis of SARS-CoV-2 by FDA under an Emergency Use Authorization (EUA). This EUA will remain in effect (meaning this test can be used) for the duration of the COVID-19 declaration under Section 564(b)(1) of the Act, 21 U.S.C. section 360bbb-3(b)(1), unless the authorization is terminated or revoked.     Resp Syncytial Virus by PCR NEGATIVE NEGATIVE Final    Comment: (NOTE) Fact Sheet for Patients: bloggercourse.com  Fact Sheet for Healthcare Providers: seriousbroker.it  This test is not yet approved or cleared by the [place]  FDA and has been authorized for detection and/or diagnosis of SARS-CoV-2 by FDA under an Emergency Use Authorization (EUA). This EUA will remain in effect (meaning this test can be used)  for the duration of the COVID-19 declaration under Section 564(b)(1) of the Act, 21 U.S.C. section 360bbb-3(b)(1), unless the authorization  is terminated or revoked.  Performed at [facility], [address], [place], [place] [address]   Blood Culture (routine x 2)     Status: None (Preliminary result)   Collection Time: 03/07/23  6:35 AM   Specimen: BLOOD  Result Value Ref Range Status   Specimen Description BLOOD LEFT ANTECUBITAL  Final   Special Requests   Final    BOTTLES DRAWN AEROBIC AND ANAEROBIC Blood Culture results may not be optimal due to an inadequate volume of blood received in culture bottles   Culture   Final    NO GROWTH 3 DAYS Performed at [facility], [address] [place], [place] [address]    Report Status PENDING  Incomplete  Blood Culture (routine x 2)     Status: None (Preliminary result)   Collection Time: 03/07/23  6:36 AM   Specimen: BLOOD RIGHT HAND  Result Value Ref Range Status   Specimen Description BLOOD RIGHT HAND  Final   Special Requests   Final    BOTTLES DRAWN AEROBIC AND ANAEROBIC Blood Culture results may not be optimal due to an inadequate volume of blood received in culture bottles   Culture   Final    NO GROWTH 3 DAYS Performed at [facility], [address] [place], [place] [address]    Report Status PENDING  Incomplete    Time coordinating discharge: 45 minutes  Signed: Hughey Rittenberry  [facility] 03/10/2023, 2:19 PM

## 2023-03-11 ENCOUNTER — Telehealth: Payer: Self-pay | Admitting: Nephrology

## 2023-03-11 NOTE — Telephone Encounter (Signed)
 Transition of Care Contact from Inpatient Facility   Date of Discharge: 03/10/23 Date of Contact: 03/11/23 Method of contact: phone Talked to patient wife   Patient contacted to discuss transition of care form recent hospitaliztion. Patient was admitted to [facility] from 03/07/23 to 03/10/23 with the discharge diagnosis of multifocal pneumonia and AMS.   Medication changes were reviewed - advised to take Augmentin  in evening.   Patient will follow up with is outpatient dialysis center 03/13/23.  Other follow up needs include: none identified.    Manuelita Labella, PA-C [employer]

## 2023-03-12 LAB — CULTURE, BLOOD (ROUTINE X 2)
Culture: NO GROWTH
Culture: NO GROWTH

## 2023-03-12 LAB — GLUCOSE, CAPILLARY: Glucose-Capillary: 146 mg/dL — ABNORMAL HIGH (ref 70–99)

## 2023-03-12 NOTE — Progress Notes (Signed)
 Late Note Entry- Jan 13. 2025  Pt was d/c on Saturday. Contacted [facility] this morning to advise clinic of pt's d/c date and that pt should resume care tomorrow.   [name] Renal Navigator [phone]

## 2023-04-01 NOTE — Progress Notes (Unsigned)
Cardiology Office Note    Date:  04/03/2023  ID:  [name], DOB 1933/06/27, MRN [id number] PCP:  Linus Galas, NP  Cardiologist:  Chrystie Nose, MD  Electrophysiologist:  None   Chief Complaint: Follow up for CAD   History of Present Illness: .    [name] is a 89 y.o. male with visit-pertinent history of CABG in 1987, moderate aortic stenosis, chronic diastolic heart failure, hypertension, hyperlipidemia, PAD, ESRD on HD, type 2 diabetes and prostate cancer.  Patient with a history of CAD s/p CABG in 1987.  Nuclear stress test in 2016 was nonischemic.  Cardiac catheterization in 05/2017 showed native vessel disease with 3 3 patent grafts.  He has mild PAD as well as mild bilateral carotid artery stenosis.  Patient has a history of ESRD on peritoneal dialysis, followed by nephrology.  At office visit with Dr. Rennis Tate on 01/01/2023 patient noted continued shortness of breath and fatigue.  A PET/CT was ordered to rule out any new areas of ischemia.  PET scan was found to be abnormal with large defect in the mid to basal lateral locations that was reversible with abnormal wall motion.  He was last seen in clinic on 02/16/2023 to review his PET stress results.  Patient noted that his breathing status remained tenuous.  He had to stop taking showers as this caused increased fatigue.  It was recommended that he undergo cardiac catheterization.  LHC with Dr. Herbie Tate on 02/27/2023 indicated progression of native LCA with now 100% CTO of left main and diffuse mild to moderate disease and a heavily calcified RCA.  The LIMA to LAD is widely patent.  Sequential ostial, mid and distal as sequential SVG to diagonal OM with 60% lesions with CTO of sequential limb to OM, was noted tiny caliber diagonal peers to have adequate flow from existing 64 year old graft.  There was mildly elevated LVEDP of 15 mmHg despite systemic hypertension with pressures in the 180s.  There is mild calcified aortic stenosis  with gradient of roughly 15 mmHg.  Given advanced age, lack of severe anginal symptoms and 89 year old graft Dr. Herbie Tate felt best course of action will be to titrate medical therapy for existing CAD.  It was not felt that stenting would be beneficial.  On 03/07/2023 patient presented to the ED after a fall.  Patient presented with complaint of generalized fatigue and falls, symptoms had started 3 days prior.  He had last attended HD on January 4 he was unable to attend on January 7 as he had a fall at home and was unable to reach the unit on time.  On 1/8 he fell again and was brought to the ED.  He had a temp of 100.4, chest x-ray showed right upper lobe infiltrates.  It was felt that he had multifocal pneumonia and was started on IV antibiotics.  Patient was discharged in stable condition on 03/10/2023.  Today he presents for follow-up with his wife.  He reports that he continues to note increased fatigue and increased shortness of breath.  He notes an occasional slight twinge in his left chest, not associated with exertion.  He denies lower extremity edema, orthopnea or PND.  Patient notes most of the symptoms started following the start of hemodialysis.  Patient was previously on Imdur and tolerated this well, patient's wife notes that he was previously on hydralazine and this was discontinued during recent hospitalization.  Discussed patient's hemodialysis, he notes increased fatigue after sessions.  He notes  sometimes that his blood pressure will be on the low side after sessions.  Discussed his recent cardiac catheterization and findings with patient and his wife, all questions were answered.  ROS: .   Today he denies lower extremity edema, palpitations, melena, hematuria, hemoptysis, diaphoresis, weakness, presyncope, syncope, orthopnea, and PND.  All other systems are reviewed and otherwise negative. Studies Reviewed: Marland Kitchen   EKG:  EKG is ordered today, personally reviewed, demonstrating  EKG  Interpretation Date/Time:  Monday April 02 2023 14:12:45 EST Ventricular Rate:  57 PR Interval:  178 QRS Duration:  76 QT Interval:  438 QTC Calculation: 426 R Axis:   10  Text Interpretation: Sinus bradycardia ST & T wave abnormality, consider lateral ischemia When compared with ECG of 07-Mar-2023 05:11, Sinus rhythm has replaced Junctional rhythm Vent. rate has decreased BY  29 BPM ST no longer depressed in Inferior leads Confirmed by Reather Littler [phone]) on 04/02/2023 2:20:07 PM   CV Studies:  Cardiac Studies & Procedures   CARDIAC CATHETERIZATION  CARDIAC CATHETERIZATION 02/27/2023  Narrative   Mid LM to Prox LAD lesion is 100% stenosed.  Ost Cx to Prox Cx lesion is 100% stenosed.   Diffuse mild to moderate disease and heavily calcified RCA: Prox RCA to Mid RCA lesion is 30% stenosed.  Mid RCA lesion is 40% stenosed. Dist RCA lesion is 30% stenosed.   ----------------GRAFTS------------------------   LIMA-midLAD graft was visualized by angiography and is large.  The graft exhibits no disease.   Seq SVG- Seq SVG-Diag-OM graft was visualized by angiography: The graft exhibits severe focal disease with CTO of sequential Limb from Diag-OM.   ** Origin lesion before 1st Diag  is 60% stenosed.  Prox Graft to Mid Graft lesion before 1st Diag is 60% stenosed.  Dist Graft lesion before 1st Diag is 60% stenosed.   ** Prox Graft to Dist Graft lesion between 1st Diag and 1st Mrg is 100% stenosed.   LV end diastolic pressure is mildly elevated.   There is mild aortic valve stenosis.  POST-CATH FINDINGS Progression of Native LCA with now 100% CTO of LM & diffuse mild-moderate disease in a heavily calcified RCA. Widely patent LIMA-LAD Sequential Ostial, mid & distal SeqSVG-Diag-OM ~60% lesions with CTO of seqential Limb to OM. Tiny Caliber Diag appears to have adequate flow from the existing 88 y/o graft. Mildly elevated LVEDP of 15 mmHg despite systemic hypertension with pressures in the  180s. Mild calcified aortic stenosis with gradient of roughly 15 mmHg. Heavily calcified Common Femoral Artery   RECOMMENDATIONS Given advanced age, lack of severe anginal symptoms, and 88 year old graft, I felt the best course of action would be to titrate medical therapy for existing CAD.  Not likely to be beneficial stenting and extensive area and 88 year old graft for a very small caliber diagonal that has an occluded distal branch. He will be discharged home after sheath removal and adequate bedrest.   Bryan Lemma, MD  Findings Coronary Findings Diagnostic  Dominance: Right  Left Main Mid LM to Prox LAD lesion is 100% stenosed. The lesion is chronically occluded. Progression from LAD to to the mid left Main.  Left Anterior Descending Vessel is large.  First Diagonal Branch Vessel is moderate in size.  Left Circumflex Ost Cx to Prox Cx lesion is 100% stenosed.  First Obtuse Marginal Branch Vessel is large in size.  Right Coronary Artery Vessel was injected. Vessel is large. There is moderate diffuse disease throughout the vessel. The vessel is severely calcified. Prox RCA  to Mid RCA lesion is 30% stenosed. The lesion is calcified. Mid RCA lesion is 40% stenosed. The lesion is calcified. Dist RCA lesion is 30% stenosed. The lesion is calcified.  LIMA LIMA Graft To Mid LAD LIMA graft was visualized by angiography and is large.  LIMA-midLAD The graft exhibits no disease.  Sequential Jump Graft Graft To 1st Diag, 1st Mrg Seq SVG- Seq SVG-Diag-OM graft was visualized by angiography.  The graft exhibits severe focal disease. Origin lesion before 1st Diag  is 60% stenosed. Prox Graft to Mid Graft lesion before 1st Diag  is 60% stenosed. Dist Graft lesion before 1st Diag  is 60% stenosed. Prox Graft to Dist Graft lesion between 1st Diag and 1st Mrg  is 100% stenosed.  Intervention  No interventions have been documented.   CARDIAC CATHETERIZATION  CARDIAC  CATHETERIZATION 06/21/2017  Narrative  Prox RCA to Mid RCA lesion is 30% stenosed.  Mid RCA lesion is 40% stenosed.  Dist RCA lesion is 30% stenosed.  Ost LAD to Prox LAD lesion is 100% stenosed.  LIMA graft was visualized by angiography and is normal in caliber.  Ost Cx to Prox Cx lesion is 90% stenosed.  Hemodynamic findings consistent with mild pulmonary hypertension.  LV end diastolic pressure is normal.  1. Severe double vessel CAD s/p 3V CABG with 3/3 patent bypass grafts 2. The LAD has 100% proximal occlusion. The entire LAD fills from the patent LIMA graft. The Diagonal branch fills from the vein graft 3. The Circumflex has 90% proximal stenosis. The vein graft fills the large obtuse marginal graft. 4. The RCA is a large dominant vessel with mild diffuse calcific plaque in the proximal, mid and distal vessel but no obstructive lesions. 5. The LIMA to the LAD is patent 6 The sequential saphenous vein graft to the Diagonal and OM is patent with mild filling defects seen in the proximal, mid and distal segments of the body of the graft. These lesions do not appear to be flow limiting. 7. Mild aortic stenosis. Peak to peak gradient 4 mmHg. The valve was easily crossed. 8. Right and left heart filling pressures outline above.  Recommendations: Continue medical management of CAD. Continue aggressive blood pressure control and diuresis for diastolic CHF. Will hydrate for 6 hours post cath and plan discharge home later today.  Findings Coronary Findings Diagnostic  Dominance: Right  Left Anterior Descending Vessel is large. Ost LAD to Prox LAD lesion is 100% stenosed. The lesion is chronically occluded.  First Diagonal Branch Vessel is moderate in size.  Left Circumflex Ost Cx to Prox Cx lesion is 90% stenosed.  First Obtuse Marginal Branch Vessel is large in size.  Right Coronary Artery Vessel is large. Prox RCA to Mid RCA lesion is 30% stenosed. The lesion is  calcified. Mid RCA lesion is 40% stenosed. The lesion is calcified. Dist RCA lesion is 30% stenosed. The lesion is calcified.  LIMA LIMA Graft To Mid LAD LIMA graft was visualized by angiography and is normal in caliber.  Sequential Graft To 1st Diag, 1st Mrg  Intervention  No interventions have been documented.   STRESS TESTS  NM PET CT CARDIAC PERFUSION MULTI W/ABSOLUTE BLOODFLOW 02/14/2023  Narrative   LV perfusion is abnormal. There is evidence of ischemia. Defect 1: There is a large defect with severe reduction in uptake present in the mid to basal lateral location(s) that is reversible. There is abnormal wall motion in the defect area. Consistent with ischemia.   Rest left ventricular  function is normal. Rest EF: 52%. Stress left ventricular function is abnormal. Stress global function is moderately reduced. There was a single regional abnormality. Stress EF: 44%. End diastolic cavity size is normal. End systolic cavity size is normal.   Myocardial blood flow reserve is not reported in this patient due to technical or patient-specific concerns that affect accuracy. Prior CABG   Coronary calcium assessment not performed due to prior revascularization.  Prior CABG   Findings are consistent with ischemia. The study is high risk.   Large lateral ischemic territory mid/base with TID and drop in EF with stress MBPF/Calcium not reported due to prior CABG  CLINICAL DATA:  This over-read does not include interpretation of cardiac or coronary anatomy or pathology. The Cardiac PET CT interpretation by the cardiologist is attached.  COMPARISON:  None Available.  FINDINGS: Cardiovascular: Aortic atherosclerosis. Dense aortic valve calcifications. Cardiomegaly extensive three-vessel coronary artery calcifications status post median sternotomy and CABG. No pericardial effusion.  Mediastinum/Nodes: No enlarged mediastinal, hilar, or axillary lymph nodes. Thyroid gland, trachea, and  esophagus demonstrate no significant findings.  Lungs/Pleura: Emphysema. Bibasilar scarring or atelectasis. No pleural effusion or pneumothorax.  Upper Abdomen: No acute abnormality. Small volume perihepatic ascites  Musculoskeletal: No chest wall abnormality. No acute osseous findings.  IMPRESSION: 1. Emphysema. 2. Cardiomegaly and extensive three-vessel coronary artery calcifications status post median sternotomy and CABG. 3. Dense aortic valve calcifications. Correlate for echocardiographic evidence of aortic stenosis. 4. Small volume perihepatic ascites.  Aortic Atherosclerosis (ICD10-I70.0) and Emphysema (ICD10-J43.9).   Electronically Signed By: Jearld Lesch M.D. On: 02/14/2023 13:25  ECHOCARDIOGRAM  ECHOCARDIOGRAM COMPLETE 10/06/2022  Narrative ECHOCARDIOGRAM REPORT    Patient Name:   [name] Date of Exam: 10/06/2022 Medical Rec #:  [id number]       Height:       71.0 in Accession #:    [id number]      Weight:       147.2 lb Date of Birth:  May 27, 1933        BSA:          1.851 m Patient Age:    89 years        BP:           120/58 mmHg Patient Gender: M               HR:           62 bpm. Exam Location:  Outpatient  Procedure: 2D Echo, Color Doppler and Cardiac Doppler  Indications:    Aortic Stenosis  History:        Patient has prior history of Echocardiogram examinations, most recent 11/11/2021. CHF, CAD, Prior CABG, PAD, Aortic Valve Disease, Signs/Symptoms:Shortness of Breath; Risk Factors:Hypertension, Diabetes and Dyslipidemia.  Sonographer:    Milbert Coulter Referring Phys: [id number] KENNETH C HILTY  IMPRESSIONS   1. Left ventricular ejection fraction, by estimation, is 60 to 65%. The left ventricle has normal function. The left ventricle has no regional wall motion abnormalities. Left ventricular diastolic parameters are indeterminate. 2. Right ventricular systolic function is normal. The right ventricular size is normal. 3. Left atrial size was  moderately dilated. 4. The mitral valve is normal in structure. Trivial mitral valve regurgitation. 5. AV is thickened, calcified Peak and mean gradients through the valve are 40 and 23 mm Hg respectively. AVA (VTI ) is 0.94 cm2 Dimensionless index is 0.3 Overall consistent with moderate AS. Compared to echo from Sept 2023, no change in mean gradient.. The  aortic valve is tricuspid. Aortic valve regurgitation is mild. 6. The inferior vena cava is normal in size with greater than 50% respiratory variability, suggesting right atrial pressure of 3 mmHg.  FINDINGS Left Ventricle: Left ventricular ejection fraction, by estimation, is 60 to 65%. The left ventricle has normal function. The left ventricle has no regional wall motion abnormalities. The left ventricular internal cavity size was normal in size. There is no left ventricular hypertrophy. Left ventricular diastolic parameters are indeterminate.  Right Ventricle: The right ventricular size is normal. Right vetricular wall thickness was not assessed. Right ventricular systolic function is normal.  Left Atrium: Left atrial size was moderately dilated.  Right Atrium: Right atrial size was normal in size.  Pericardium: There is no evidence of pericardial effusion.  Mitral Valve: The mitral valve is normal in structure. Trivial mitral valve regurgitation. MV peak gradient, 6.6 mmHg. The mean mitral valve gradient is 2.0 mmHg.  Tricuspid Valve: The tricuspid valve is normal in structure. Tricuspid valve regurgitation is trivial.  Aortic Valve: AV is thickened, calcified Peak and mean gradients through the valve are 40 and 23 mm Hg respectively. AVA (VTI ) is 0.94 cm2 Dimensionless index is 0.3 Overall consistent with moderate AS. Compared to echo from Sept 2023, no change in mean gradient. The aortic valve is tricuspid. Aortic valve regurgitation is mild. Aortic regurgitation PHT measures 488 msec. Aortic valve mean gradient measures 25.0 mmHg.  Aortic valve peak gradient measures 42.2 mmHg. Aortic valve area, by VTI measures 0.94 cm.  Pulmonic Valve: The pulmonic valve was not well visualized. Pulmonic valve regurgitation is not visualized. No evidence of pulmonic stenosis.  Aorta: The aortic root is normal in size and structure.  Venous: The inferior vena cava is normal in size with greater than 50% respiratory variability, suggesting right atrial pressure of 3 mmHg.  IAS/Shunts: No atrial level shunt detected by color flow Doppler.   LEFT VENTRICLE PLAX 2D LVIDd:         4.30 cm   Diastology LVIDs:         3.20 cm   LV e' medial:    3.48 cm/s LV PW:         1.00 cm   LV E/e' medial:  29.9 LV IVS:        1.00 cm   LV e' lateral:   9.57 cm/s LVOT diam:     2.00 cm   LV E/e' lateral: 10.9 LV SV:         79 LV SV Index:   42 LVOT Area:     3.14 cm   RIGHT VENTRICLE RV Basal diam:  2.10 cm RV Mid diam:    1.40 cm RV S prime:     7.72 cm/s TAPSE (M-mode): 1.3 cm  LEFT ATRIUM             Index        RIGHT ATRIUM           Index LA diam:        4.20 cm 2.27 cm/m   RA Area:     12.50 cm LA Vol (A2C):   74.3 ml 40.14 ml/m  RA Volume:   25.90 ml  13.99 ml/m LA Vol (A4C):   78.5 ml 42.41 ml/m LA Biplane Vol: 79.3 ml 42.84 ml/m AORTIC VALVE AV Area (Vmax):    0.95 cm AV Area (Vmean):   0.92 cm AV Area (VTI):     0.94 cm AV Vmax:  325.00 cm/s AV Vmean:          235.000 cm/s AV VTI:            0.832 m AV Peak Grad:      42.2 mmHg AV Mean Grad:      25.0 mmHg LVOT Vmax:         98.50 cm/s LVOT Vmean:        68.600 cm/s LVOT VTI:          0.250 m LVOT/AV VTI ratio: 0.30 AI PHT:            488 msec  AORTA Ao Root diam: 3.50 cm  MITRAL VALVE                TRICUSPID VALVE MV Area (PHT): 2.91 cm     TR Peak grad:   22.3 mmHg MV Area VTI:   1.83 cm     TR Vmax:        236.00 cm/s MV Peak grad:  6.6 mmHg MV Mean grad:  2.0 mmHg     SHUNTS MV Vmax:       1.28 m/s     Systemic VTI:  0.25 m MV  Vmean:      73.4 cm/s    Systemic Diam: 2.00 cm MV Decel Time: 261 msec MV E velocity: 104.00 cm/s MV A velocity: 110.00 cm/s MV E/A ratio:  0.95  Dietrich Pates MD Electronically signed by Dietrich Pates MD Signature Date/Time: 10/08/2022/1:09:23 PM    Final              Current Reported Medications:.    Current Meds  Medication Sig   Alpha-D-Galactosidase (BEANO PO) Take 1 tablet by mouth daily as needed (Gas).   aspirin EC 81 MG tablet Take 81 mg by mouth daily.   atorvastatin (LIPITOR) 80 MG tablet TAKE 1 TABLET BY MOUTH EVERY DAY   carvedilol (COREG) 3.125 MG tablet TAKE 1 TABLET BY MOUTH TWICE A DAY WITH FOOD   Cyanocobalamin (VITAMIN B-12) 5000 MCG TBDP Take 5,000 Units by mouth daily.   diphenhydramine-acetaminophen (TYLENOL PM) 25-500 MG TABS tablet Take 1 tablet by mouth at bedtime as needed (Sleep).   Emollient (GOLD BOND ULTIMATE HEALING) CREA Apply 1 Application topically daily as needed (Moisturizer).   famotidine (PEPCID) 10 MG tablet Take 10 mg by mouth daily as needed for heartburn or indigestion.   fexofenadine (ALLEGRA) 180 MG tablet Take 180 mg by mouth daily as needed for allergies or rhinitis.   folic acid (FOLVITE) 800 MCG tablet Take 400 mcg by mouth daily.   furosemide (LASIX) 80 MG tablet Take 1 tablet (80 mg total) by mouth daily as needed.   gentamicin cream (GARAMYCIN) 0.1 % Apply 1 Application topically daily as needed (Rash).   Hydrocortisone Acetate 1 % CREA Apply 1 application  topically daily as needed (Itching).   insulin glargine (LANTUS) 100 UNIT/ML injection Inject 10 Units into the skin daily as needed (high blood glucose).   isosorbide mononitrate (IMDUR) 30 MG 24 hr tablet Take 1 tablet on day you do not have dialysis   melatonin 1 MG TABS tablet Take 1 mg by mouth at bedtime as needed (Sleep).   Nutritional Supplements (,FEEDING SUPPLEMENT, PROSOURCE PLUS) liquid Take 30 mLs by mouth 2 (two) times daily between meals. (Patient taking differently:  Take 30 mLs by mouth every other day. premier protein)   OVER THE COUNTER MEDICATION Place 1 spray into both nostrils daily as needed (Congestion).  mucinex saline spray   polyethylene glycol (MIRALAX / GLYCOLAX) 17 g packet Take 17 g by mouth daily as needed for mild constipation.   Propylene Glycol, PF, (SYSTANE COMPLETE PF) 0.6 % SOLN Place 1 drop into both eyes daily.   sevelamer carbonate (RENVELA) 800 MG tablet Take 2,400 mg by mouth 2 (two) times daily.   zolpidem (AMBIEN) 5 MG tablet Take 5 mg by mouth at bedtime as needed.    Physical Exam:    VS:  BP (!) 144/70 (BP Location: Right Arm, Patient Position: Sitting, Cuff Size: Normal)   Pulse (!) 57   Ht 5\' 11"  (1.803 m)   Wt 146 lb 9.6 oz (66.5 kg)   SpO2 99%   BMI 20.45 kg/m    Wt Readings from Last 3 Encounters:  04/02/23 146 lb 9.6 oz (66.5 kg)  03/10/23 145 lb 4.5 oz (65.9 kg)  02/27/23 146 lb (66.2 kg)    GEN: Well nourished, well developed in no acute distress NECK: No JVD; No carotid bruits CARDIAC: RRR, 1/6 systolic murmur at left sternal border, no rubs or gallops RESPIRATORY:  Clear to auscultation without rales, wheezing or rhonchi  ABDOMEN: Soft, non-tender, non-distended EXTREMITIES:  No edema; No acute deformity   Asessement and Plan:.    CAD: s/p CABG in 1987.  Cath in 2019 showed native vessel disease with 3 out of 3 patent grafts.  Patient underwent LHC with Dr. Herbie Tate on 02/27/2023 which indicated progression of native LCA with now 100% CTO of the left main and diffuse mild to moderate disease and heavily calcified RCA.  LIMA to LAD was widely patent.  Sequential ostial, mid and distal as sequential SVG to diagonal OM with 60% lesions with CTO of sequential limb to OM, was noted tiny caliber diagonal peers to have adequate flow from existing 43 year old graft.  It was not felt that stenting would be beneficial, recommended to titrate medical therapy. Today patient reports ongoing fatigue and shortness of  breath.  He also notes an occasional slight twinge in his left chest, not associated with exertion.  Patient's blood pressure today mildly elevated at 144/70, patient's wife notes his hydralazine was recently discontinued.  Discussed starting Imdur 30 mg daily, patient was previously on this and tolerated well per patient's wife.  They do note that his blood pressure is on the low side following dialysis sessions, question if some of his symptoms are related to dialysis.  Will start Imdur 30 mg daily on nondialysis days.  Encourage patient to monitor his blood pressure at home.  Reviewed ED precautions.  Continue aspirin 81 mg daily, Lipitor 80 mg daily, carvedilol 3.125 mg twice daily.  Aortic stenosis/Chronic diastolic HF: Echo on 10/06/2022 indicated LVEF of 60 to 65%, no RWMA, diastolic parameters were indeterminate.  Noted to have moderate AAS compared to echo from September 2023 no change in mean gradient.  LHC indicated mild aortic stenosis.  Patient to have repeat echo in 09/2023.  Hypertension: Blood pressure today 144/70, on recheck was 142/76.  Patient reports that his hydralazine was recently discontinued.  Patient was previously on Imdur 30 mg daily, will restart this as noted above.  Hyperlipidemia: Patient does not have recent fasting lipid profile available.  He will return and have fasting lipid profile and LFTs.  ESRD on HD: On HD Tuesday, Thursday and Saturday.  Following with nephrology.  Patient's wife reports that they are trying to transition back to peritoneal dialysis.  Type 2 diabetes: Monitored and managed per  PCP.     Disposition: F/u with Reather Littler, NP in one month.   Signed, Rip Harbour, NP

## 2023-04-02 ENCOUNTER — Ambulatory Visit: Payer: Medicare Other | Admitting: Adult Health

## 2023-04-02 ENCOUNTER — Ambulatory Visit: Payer: Medicare Other | Attending: Cardiology | Admitting: Cardiology

## 2023-04-02 ENCOUNTER — Encounter: Payer: Self-pay | Admitting: Cardiology

## 2023-04-02 VITALS — BP 144/70 | HR 57 | Ht 71.0 in | Wt 146.6 lb

## 2023-04-02 DIAGNOSIS — Z992 Dependence on renal dialysis: Secondary | ICD-10-CM

## 2023-04-02 DIAGNOSIS — I350 Nonrheumatic aortic (valve) stenosis: Secondary | ICD-10-CM | POA: Diagnosis not present

## 2023-04-02 DIAGNOSIS — I10 Essential (primary) hypertension: Secondary | ICD-10-CM | POA: Diagnosis not present

## 2023-04-02 DIAGNOSIS — I25810 Atherosclerosis of coronary artery bypass graft(s) without angina pectoris: Secondary | ICD-10-CM | POA: Diagnosis not present

## 2023-04-02 DIAGNOSIS — Z951 Presence of aortocoronary bypass graft: Secondary | ICD-10-CM | POA: Diagnosis not present

## 2023-04-02 DIAGNOSIS — I5030 Unspecified diastolic (congestive) heart failure: Secondary | ICD-10-CM

## 2023-04-02 DIAGNOSIS — N186 End stage renal disease: Secondary | ICD-10-CM

## 2023-04-02 MED ORDER — ISOSORBIDE MONONITRATE ER 30 MG PO TB24: ORAL_TABLET | ORAL | 3 refills | Status: DC

## 2023-04-02 NOTE — Patient Instructions (Signed)
Medication Instructions:  Start Imdur 30 mg on days you do not have dialysis. *If you need a refill on your cardiac medications before your next appointment, please call your pharmacy*  Lab Work: In the next few weeks we will need you to get fasting lipid panel and LFTs If you have labs (blood work) drawn today and your tests are completely normal, you will receive your results only by: MyChart Message (if you have MyChart) OR A paper copy in the mail If you have any lab test that is abnormal or we need to change your treatment, we will call you to review the results.  Testing/Procedures: No testing  Follow-Up: At [facility], you and your health needs are our priority.  As part of our continuing mission to provide you with exceptional heart care, we have created designated Provider Care Teams.  These Care Teams include your primary Cardiologist (physician) and Advanced Practice Providers (APPs -  Physician Assistants and Nurse Practitioners) who all work together to provide you with the care you need, when you need it.  We recommend signing up for the patient portal called "MyChart".  Sign up information is provided on this After Visit Summary.  MyChart is used to connect with patients for Virtual Visits (Telemedicine).  Patients are able to view lab/test results, encounter notes, upcoming appointments, etc.  Non-urgent messages can be sent to your provider as well.   To learn more about what you can do with MyChart, go to [email or web address].    Your next appointment:   1 month(s)  Provider:   Any APP

## 2023-04-03 ENCOUNTER — Encounter: Payer: Self-pay | Admitting: Cardiology

## 2023-04-23 ENCOUNTER — Other Ambulatory Visit: Payer: Self-pay | Admitting: Internal Medicine

## 2023-04-29 NOTE — Progress Notes (Unsigned)
 Cardiology Clinic Note   Patient Name: [name] Date of Encounter: 05/02/2023  Primary Care Provider:  Linus Galas, NP Primary Cardiologist:  Chrystie Nose, MD  Patient Profile    [name] presents to the clinic today for follow-up evaluation of his coronary artery disease and HTN.  Past Medical History    Past Medical History:  Diagnosis Date   Acute coronary syndrome (HCC) 04/18/2019   Aortic stenosis 02/27/2020   mild to moderate AS   Arthritis    "a little in LLE" (06/20/2017)   Ataxia    Chronic diastolic CHF (congestive heart failure) (HCC)    Coronary artery disease    a. s/p CABG 1980s. b. stable by cath 05/2017.   Dizziness    Dyslipidemia    Edema 08/08/2017   HANDS & LOWER EXTREMITES   ESRD on hemodialysis (HCC)    Hypertension    Hypoalbuminemia    Nephrotic range proteinuria    Peripheral artery disease (HCC)    mild   Pneumonia ~ 1950 X 1   Prostate cancer (HCC) ~ 2015   Type II diabetes mellitus (HCC)    Past Surgical History:  Procedure Laterality Date   CAPD REVISION N/A 06/29/2022   Procedure: REPLACEMENT OF PERITONEAL DIALYSIS CATHETER;  Surgeon: Leonie Douglas, MD;  Location: [facility] OR;  Service: Vascular;  Laterality: N/A;   CARDIAC CATHETERIZATION     "a couple times" (06/20/2017)   CATARACT EXTRACTION W/ INTRAOCULAR LENS  IMPLANT, BILATERAL Bilateral    COLONOSCOPY     CORONARY ARTERY BYPASS GRAFT  03/06/1985   LAD and first diagonal/circumflex (sequential saphenous vein graft,cardioplegia   INGUINAL HERNIA REPAIR Right 12/06/2020   Procedure: OPEN RIGHT INGUINAL HERNIA REPAIR;  Surgeon: Luretha Murphy, MD;  Location: [facility] ORS;  Service: General;  Laterality: Right;   INSERTION PROSTATE RADIATION SEED  ~ 2015   IR FLUORO GUIDE CV LINE RIGHT  06/26/2022   IR US GUIDE VASC ACCESS RIGHT  06/26/2022   LAPAROSCOPIC LYSIS OF ADHESIONS N/A 06/29/2022   Procedure: LAPAROSCOPIC LYSIS OF ADHESIONS;  Surgeon: Leonie Douglas, MD;  Location: [facility]  OR;  Service: Vascular;  Laterality: N/A;   LAPAROSCOPY N/A 06/29/2022   Procedure: LAPAROSCOPY DIAGNOSTIC;  Surgeon: Leonie Douglas, MD;  Location: [facility] OR;  Service: Vascular;  Laterality: N/A;   LEFT HEART CATH AND CORS/GRAFTS ANGIOGRAPHY N/A 02/27/2023   Procedure: LEFT HEART CATH AND CORS/GRAFTS ANGIOGRAPHY;  Surgeon: Marykay Lex, MD;  Location: [facility] INVASIVE CV LAB;  Service: Cardiovascular;  Laterality: N/A;   NM MYOCAR PERF WALL MOTION  09/12/2006   no ischemia   REMOVAL OF A DIALYSIS CATHETER N/A 07/04/2022   Procedure: PERITONEAL CATHETER REMOVAL;  Surgeon: Victorino Sparrow, MD;  Location: [facility] OR;  Service: Vascular;  Laterality: N/A;   RIGHT/LEFT HEART CATH AND CORONARY/GRAFT ANGIOGRAPHY N/A 06/21/2017   Procedure: RIGHT/LEFT HEART CATH AND CORONARY/GRAFT ANGIOGRAPHY;  Surgeon: Kathleene Hazel, MD;  Location: [facility] INVASIVE CV LAB;  Service: Cardiovascular;  Laterality: N/A;   US ECHOCARDIOGRAPHY  11/04/2008   trace TR & MR    Allergies  Allergies  Allergen Reactions   Clonidine Hcl     Other reaction(s): constipation    History of Present Illness    [name] has a PMH of coronary artery disease status post CABG 1987, essential hypertension, PAD, hyperlipidemia, diabetes, CKD, ESRD, dizziness, shortness of breath, and hypokalemia.  His PMH also includes chronic diastolic CHF. He was admitted with  shortness of breath and mildly elevated troponins on 04/18/2019-04/25/2019.  He stated that he had noticed progressive shortness of breath over the last several months which had worsened 2 days prior to coming to the hospital.  He was followed closely by nephrology and recently had a peritoneal dialysis site placed by vascular surgeon.  He does continue to make urine.  He was on Lasix for congestive heart failure which had recently been doubled due to his bilateral lower extremity edema and weight gain.  His HsT was elevated at 335 and cardiology was consulted.   He was  started on IV heparin due to concern for possible ACS.  He had no complaints of chest discomfort/angina.  His chest x-ray showed pulmonary edema versus PNA.  His BNP was also elevated at 1406.  He also had leukocytosis and low-grade fever.  His Covid was negative.  He was ultimately started on IV Lasix 80 mg twice daily and his potassium was replaced multiple times throughout his hospitalization.  Due to his elevated WBCs he was started on empiric antibiotic therapy.  His high-sensitivity troponins were low and flat and in the absence of chest pain/angina so there was a very low suspicion for ACS and his IV heparin was DC'd.   This follow-up echocardiogram showed normal LVEF with an RWMA and grade 2 diastolic dysfunction, mild TR, and moderate aortic stenosis, which was unchanged from prior studies.  Due to his CKD stage V and anticipation of peritoneal dialysis, nephrology was consulted for diuretic recommendations.  His weight at discharge was 172 pounds.  He was transitioned from IV diuresis to p.o. Lasix 80 mg twice daily.  His amlodipine, carvedilol, hydralazine, and statin were continued.   He presented for follow-up with me 05/14/2019.  He stated he felt well.  He had 2 more days and his peritoneal dialysis class before he would be able to do his peritoneal dialysis on his own.  His weight was 155 pounds.  He had been walking regularly for an hour a day and avoiding sodium in his diet.  He continued to wear his lower extremity support stockings.  He went on to state that he  received both of his COVID-19 vaccinations.  He followed up with Bettina Gavia PA-C on 02/09/2021.  He denied claudication.  His repeat echocardiogram 12/22 showed aortic stenosis that had progressed to moderate.  He noticed improvement in his fatigue and DOE with starting co-Q10 and B complex vitamins.  He was walking 5-minute increments in his house.  He was doing this several times per day.  It was felt that his dementia may be  progressing because instructions had to be given multiple times.  He presented to the clinic 06/16/21 for follow-up evaluation and stated he had been noticing increased shortness of breath and dizziness over 4 to 5 days.  He reported that he typically walked out to his mailbox and back without issue.  He was having to stop halfway for rest breaks.  He did report trouble with pollen and seasonal allergies.  I  asked him to use his allergy medication daily/nasal spray daily.  He denied any irregular or skipped heartbeats.  He  had a 5 pound weight gain in the last 4 months.  He did not appear volume overloaded.   He did have some continued chronic difficulty with memory.  I  ordered a echocardiogram, CBC, BMP, lipid panel and LFTs.  I  encouraged him to use his allergy medication daily, maintain his p.o. hydration,  and planned follow-up for after his echocardiogram.  He continue to follow-up with cardiology regularly and was last seen by [facility] NP on 04/02/2023.  He noted increased fatigue and increased shortness of breath.  He notes occasional slight twinge in his chest which was not associated with exertion.  He denied lower extremity swelling orthopnea and PND.  He noted most of his symptoms started following the start of HD.  His Imdur 30 mg daily was restarted.  His blood pressure was noted to be 144/70 and on recheck was 142/76.  His wife reported that they have been trying to transition back to peritoneal dialysis.  Follow-up was planned for 1 month.  He presents to the clinic today for follow-up and states he is tired after hemodialysis.  His wife reports they continue to try to move towards PD.  His blood pressure today is 104/70.  He has been taking his Imdur on nondialysis days.  He has been doing well with this.  He denies sustained dizziness.  He does note episodes of dizziness with getting up in the morning which quickly dissipate.  He does note that he has having some trouble with his sleep.  He is on  Ambien.  I will give the sleep hygiene instructions.  I will continue his current medication regimen, have him maintain his physical activity.  We will plan follow-up in 6 months.  Today he denies chest pain, shortness of breath, lower extremity edema, fatigue, palpitations, melena, hematuria, hemoptysis, diaphoresis, weakness, presyncope, syncope, orthopnea, and PND.    Home Medications    Prior to Admission medications   Medication Sig Start Date End Date Taking? Authorizing Provider  amLODipine (NORVASC) 10 MG tablet Take 10 mg by mouth at bedtime.    [provider]  aspirin EC 81 MG tablet Take 81 mg by mouth daily.    [provider]  atorvastatin (LIPITOR) 80 MG tablet TAKE 1/2 TABLET BY MOUTH AT BEDTIME 04/04/21   Hilty, Lisette Abu, MD  Azelastine HCl 0.15 % SOLN Place 1 spray into both nostrils daily as needed (allergies).    [provider]  B Complex-C (B COMPLEX-VITAMIN C) CAPS Take by mouth. 01/06/21   [provider]  carvedilol (COREG) 3.125 MG tablet TAKE 1 TABLET (3.125 MG TOTAL) BY MOUTH 2 (TWO) TIMES DAILY WITH A MEAL. 03/01/21   Hilty, Lisette Abu, MD  folic acid (FOLVITE) 800 MCG tablet Take 800 mcg by mouth daily.    [provider]  furosemide (LASIX) 80 MG tablet TAKE 1 TABLET (80 MG TOTAL) BY MOUTH 2 (TWO) TIMES DAILY AT 10 AM AND 5 PM. 07/06/20   Runell Gess, MD  glimepiride (AMARYL) 4 MG tablet Take 4 mg by mouth daily as needed (diabetes).    [provider]  hydrALAZINE (APRESOLINE) 50 MG tablet TAKE 1 TABLET (50 MG TOTAL) BY MOUTH IN THE MORNING AND AT BEDTIME. 03/28/21   Hilty, Lisette Abu, MD  HYDROcodone-acetaminophen (NORCO/VICODIN) 5-325 MG tablet Take 1 tablet by mouth every 6 (six) hours as needed for moderate pain. 12/06/20   Luretha Murphy, MD  insulin glargine (LANTUS) 100 UNIT/ML injection Inject 13 Units into the skin at bedtime. 10/29/19   [provider]  KLOR-CON M10 10 MEQ tablet TAKE 1 TABLET  BY MOUTH EVERY DAY 07/27/20   Hilty, Lisette Abu, MD  Multiple Vitamin (MULTIVITAMIN) tablet Take 1 tablet by mouth daily.     [provider]  nitroGLYCERIN (NITROSTAT) 0.4 MG SL tablet Place  1 tablet (0.4 mg total) under the tongue every 5 (five) minutes x 3 doses as needed for chest pain. 04/28/19   Hilty, Lisette Abu, MD  OVER THE COUNTER MEDICATION Place 1 spray into the nose daily as needed. Sovereign Silver Nasal Spray for Immune Support    [provider]  Propylene Glycol (SYSTANE COMPLETE) 0.6 % SOLN Place 1 drop into both eyes daily as needed (Dry eye).    [provider]  TRADJENTA 5 MG TABS tablet Take 5 mg by mouth every morning.  02/27/17   [provider]  traZODone (DESYREL) 50 MG tablet Take 50 mg by mouth at bedtime as needed for sleep.  07/12/17   [provider]    Family History    Family History  Problem Relation Age of Onset   Heart attack Mother    He indicated that his mother is deceased. He indicated that his father is deceased. He indicated that his maternal grandmother is deceased. He indicated that his maternal grandfather is deceased. He indicated that his paternal grandmother is deceased. He indicated that his paternal grandfather is deceased.  Social History    Social History   Socioeconomic History   Marital status: Married    Spouse name: Olegario Messier   Number of children: Not on file   Years of education: Not on file   Highest education level: Not on file  Occupational History   Occupation: Retired  Tobacco Use   Smoking status: Former    Current packs/day: 0.00    Average packs/day: 1 pack/day for 20.0 years (20.0 ttl pk-yrs)    Types: Cigarettes    Start date: 08/22/1952    Quit date: 08/22/1972    Years since quitting: 50.7   Smokeless tobacco: Never  Vaping Use   Vaping status: Never Used  Substance and Sexual Activity   Alcohol use: Not Currently    Comment: 06/20/2017 "nothing since ~ 1974"   Drug use: No    Sexual activity: Not Currently  Other Topics Concern   Not on file  Social History Narrative   Lives with wife   Right handed   Caffeine- 1 cup some days    Social Drivers of Corporate investment banker Strain: Not on file  Food Insecurity: No Food Insecurity (03/07/2023)   Hunger Vital Sign    Worried About Running Out of Food in the Last Year: Never true    Ran Out of Food in the Last Year: Never true  Transportation Needs: No Transportation Needs (03/07/2023)   PRAPARE - Administrator, Civil Service (Medical): No    Lack of Transportation (Non-Medical): No  Physical Activity: Sufficiently Active (10/31/2019)   Exercise Vital Sign    Days of Exercise per Week: 7 days    Minutes of Exercise per Session: 40 min  Stress: Not on file  Social Connections: Socially Isolated (03/07/2023)   Social Connection and Isolation Panel [NHANES]    Frequency of Communication with Friends and Family: Once a week    Frequency of Social Gatherings with Friends and Family: Once a week    Attends Religious Services: Never    Database administrator or Organizations: No    Attends Engineer, structural: Not on file    Marital Status: Married  Catering manager Violence: Not At Risk (03/07/2023)   Humiliation, Afraid, Rape, and Kick questionnaire    Fear of Current or Ex-Partner: No    Emotionally Abused: No  Physically Abused: No    Sexually Abused: No     Review of Systems    General:  No chills, fever, night sweats or weight changes.  Cardiovascular:  No chest pain, dyspnea on exertion, edema, orthopnea, palpitations, paroxysmal nocturnal dyspnea. Dermatological: No rash, lesions/masses Respiratory: No cough, dyspnea Urologic: No hematuria, dysuria Abdominal:   No nausea, vomiting, diarrhea, bright red blood per rectum, melena, or hematemesis Neurologic:  No visual changes, wkns, changes in mental status. All other systems reviewed and are otherwise negative except as noted  above.  Physical Exam    VS:  BP 104/70 (BP Location: Left Arm, Patient Position: Sitting, Cuff Size: Normal)   Pulse 60   Ht 5\' 11"  (1.803 m)   Wt 142 lb (64.4 kg)   BMI 19.80 kg/m  , BMI Body mass index is 19.8 kg/m. GEN: Well nourished, well developed, in no acute distress. HEENT: normal. Neck: Supple, no JVD, carotid bruits, or masses. Cardiac: RRR, systolic murmur heard best along right sternal border 3-4/6 murmur, rubs, or gallops. No clubbing, cyanosis, edema.  Radials/DP/PT 2+ and equal bilaterally.  Respiratory:  Respirations regular and unlabored, clear to auscultation bilaterally. GI: Soft, nontender, nondistended, BS + x 4. MS: no deformity or atrophy. Skin: warm and dry, no rash. Neuro:  Strength and sensation are intact. Psych: Normal affect.  Accessory Clinical Findings    Recent Labs: 06/22/2022: B Natriuretic Peptide 1,252.9 06/23/2022: Magnesium 2.0 03/07/2023: ALT 19 03/08/2023: BUN 93; Creatinine, Ser 12.53; Hemoglobin 9.2; Platelets 229; Potassium 5.0; Sodium 139; TSH 5.417   Recent Lipid Panel    Component Value Date/Time   CHOL 170 07/05/2021 1045   TRIG 72 07/05/2021 1045   HDL 72 07/05/2021 1045   CHOLHDL 2.4 07/05/2021 1045   CHOLHDL 2.6 04/19/2019 0332   VLDL 12 04/19/2019 0332   LDLCALC 84 07/05/2021 1045    ECG personally reviewed by me today-none today.  EKG 06/16/2021 Normal sinus rhythm left ventricular hypertrophy with repolarization abnormality 67 bpm - No acute changes  Echocardiogram 02/08/2021 1. Left ventricular ejection fraction, by estimation, is 60 to 65%. Left  ventricular ejection fraction by 3D volume is 65 %. The left ventricle has  normal function. The left ventricle has no regional wall motion  abnormalities. Left ventricular diastolic   parameters are consistent with Grade I diastolic dysfunction (impaired  relaxation).   2. Right ventricular systolic function is normal. The right ventricular  size is mildly enlarged.  There is normal pulmonary artery systolic  pressure. The estimated right ventricular systolic pressure is 31.3 mmHg.   3. Left atrial size was mildly dilated.   4. Right atrial size was mildly dilated.   5. The mitral valve is normal in structure. No evidence of mitral valve  regurgitation. No evidence of mitral stenosis.   6. The aortic valve is calcified. There is moderate calcification of the  aortic valve. There is moderate thickening of the aortic valve. Aortic  valve regurgitation is not visualized. Mild to moderate aortic valve  stenosis. Aortic valve area, by VTI  measures 1.17 cm. Aortic valve mean gradient measures 19.0 mmHg. Aortic  valve Vmax measures 2.99 m/s.   7. The inferior vena cava is normal in size with greater than 50%  respiratory variability, suggesting right atrial pressure of 3 mmHg.  CARDIAC CATHETERIZATION 02/27/2023   Narrative   Mid LM to Prox LAD lesion is 100% stenosed.  Ost Cx to Prox Cx lesion is 100% stenosed.   Diffuse mild to  moderate disease and heavily calcified RCA: Prox RCA to Mid RCA lesion is 30% stenosed.  Mid RCA lesion is 40% stenosed. Dist RCA lesion is 30% stenosed.   ----------------GRAFTS------------------------   LIMA-midLAD graft was visualized by angiography and is large.  The graft exhibits no disease.   Seq SVG- Seq SVG-Diag-OM graft was visualized by angiography: The graft exhibits severe focal disease with CTO of sequential Limb from Diag-OM.   ** Origin lesion before 1st Diag  is 60% stenosed.  Prox Graft to Mid Graft lesion before 1st Diag is 60% stenosed.  Dist Graft lesion before 1st Diag is 60% stenosed.   ** Prox Graft to Dist Graft lesion between 1st Diag and 1st Mrg is 100% stenosed.   LV end diastolic pressure is mildly elevated.   There is mild aortic valve stenosis.   POST-CATH FINDINGS Progression of Native LCA with now 100% CTO of LM & diffuse mild-moderate disease in a heavily calcified RCA. Widely patent  LIMA-LAD Sequential Ostial, mid & distal SeqSVG-Diag-OM ~60% lesions with CTO of seqential Limb to OM. Tiny Caliber Diag appears to have adequate flow from the existing 89 y/o graft. Mildly elevated LVEDP of 15 mmHg despite systemic hypertension with pressures in the 180s. Mild calcified aortic stenosis with gradient of roughly 15 mmHg. Heavily calcified Common Femoral Artery     RECOMMENDATIONS Given advanced age, lack of severe anginal symptoms, and 88 year old graft, I felt the best course of action would be to titrate medical therapy for existing CAD.  Not likely to be beneficial stenting and extensive area and 88 year old graft for a very small caliber diagonal that has an occluded distal branch. He will be discharged home after sheath removal and adequate bedrest.     Bryan Lemma, MD  Diagnostic Dominance: Right  Intervention  Assessment & Plan   1.  Coronary artery disease-no episodes of anginal type pain.  Status post CABG 1987.  Underwent cardiac catheterization in 2019 which showed patent grafts.  Cardiac catheterization 02/27/2023 showed SVG-diagonal graft with distal portion 100% stenosed, LIMA-LAD graft showed no disease.  Details above. Continue aspirin, carvedilol, Imdur Maintain low-sodium diet Maintain physical activity  Essential hypertension-BP today 104/70.  Well-controlled at home. Continue amlodipine, carvedilol, hydralazine, Imdur Increase physical activity as tolerated  Dizziness, shortness of breath-somewhat improved breathing with addition of Imdur.  Euvolemic.  Weight stable.  Echocardiogram 10/06/2022 showed normal LVEF and intermediate diastolic parameters. Maintain p.o. hydration Increase physical activity as tolerated  Chronic diastolic CHF-echocardiogram 10/06/2022 showed normal LVEF and intermediate diastolic parameters.  Details above. Continue furosemide.  Weight stable.  Euvolemic today. Heart healthy low-sodium diet-salty 6 given Increase  physical activity as tolerated Continue furosemide, HD (Tuesday Thursday Saturday)   Hyperlipidemia-LDL 84 on 07/05/2021 Continue atorvastatin, aspirin High-fiber diet Increase physical activity as tolerated Repeat fasting lipids and LFTs- request from PCP   End-stage renal disease-on HD Tuesday Thursday Saturday.   Follows with nephrology  Disposition: Follow-up with Dr. [name] or me in 6 months.  Thomasene Ripple. Barbar Brede NP-C    05/02/2023, 2:15 PM [facility] [address] Office [phone] Fax [phone]  Notice: This dictation was prepared with Dragon dictation along with smaller phrase technology. Any transcriptional errors that result from this process are unintentional and may not be corrected upon review.  I spent 14 minutes examining this patient, reviewing medications, and using patient centered shared decision making involving her cardiac care.   I spent  20 minutes reviewing her past medical  history,  medications, and prior cardiac tests.

## 2023-05-02 ENCOUNTER — Ambulatory Visit: Payer: Medicare Other | Attending: General Practice | Admitting: General Practice

## 2023-05-02 ENCOUNTER — Encounter: Payer: Self-pay | Admitting: General Practice

## 2023-05-02 VITALS — BP 104/70 | HR 60 | Ht 71.0 in | Wt 142.0 lb

## 2023-05-02 DIAGNOSIS — I10 Essential (primary) hypertension: Secondary | ICD-10-CM

## 2023-05-02 DIAGNOSIS — R42 Dizziness and giddiness: Secondary | ICD-10-CM | POA: Diagnosis not present

## 2023-05-02 DIAGNOSIS — E785 Hyperlipidemia, unspecified: Secondary | ICD-10-CM

## 2023-05-02 DIAGNOSIS — I5030 Unspecified diastolic (congestive) heart failure: Secondary | ICD-10-CM

## 2023-05-02 DIAGNOSIS — N186 End stage renal disease: Secondary | ICD-10-CM

## 2023-05-02 DIAGNOSIS — Z992 Dependence on renal dialysis: Secondary | ICD-10-CM

## 2023-05-02 DIAGNOSIS — Z951 Presence of aortocoronary bypass graft: Secondary | ICD-10-CM

## 2023-05-02 DIAGNOSIS — I25810 Atherosclerosis of coronary artery bypass graft(s) without angina pectoris: Secondary | ICD-10-CM | POA: Diagnosis not present

## 2023-05-02 DIAGNOSIS — I739 Peripheral vascular disease, unspecified: Secondary | ICD-10-CM

## 2023-05-02 NOTE — Patient Instructions (Signed)
 Medication Instructions:  The current medical regimen is effective;  continue present plan and medications as directed. Please refer to the Current Medication list given to you today.  *If you need a refill on your cardiac medications before your next appointment, please call your pharmacy*  Lab Work: NONE If you have labs (blood work) drawn today and your tests are completely normal, you will receive your results only by:  MyChart Message (if you have MyChart) OR  A paper copy in the mail If you have any lab test that is abnormal or we need to change your treatment, we will call you to review the results.  Other Instructions MAINTAIN YOUR PHYSICAL ACTIVITY SEE SLEEP TIPS BELOW  Follow-Up: At [facility], you and your health needs are our priority.  As part of our continuing mission to provide you with exceptional heart care, we have created designated Provider Care Teams.  These Care Teams include your primary Cardiologist (physician) and Advanced Practice Providers (APPs -  Physician Assistants and Nurse Practitioners) who all work together to provide you with the care you need, when you need it.  Your next appointment:   6 month(s)  Provider:   Chrystie Nose, MD  or Edd Fabian, FNP        Quality Sleep Information, Adult Quality sleep is important for your mental and physical health. It also improves your quality of life. Quality sleep means you: Are asleep for most of the time you are in bed. Fall asleep within 30 minutes. Wake up no more than once a night. Are awake for no longer than 20 minutes if you do wake up during the night. Most adults need 7-8 hours of quality sleep each night. How can poor sleep affect me? If you do not get enough quality sleep, you may have: Mood swings. Daytime sleepiness. Decreased alertness, reaction time, and concentration. Sleep disorders, such as insomnia and sleep apnea. Difficulty with: Solving problems. Coping with  stress. Paying attention. These issues may affect your performance and productivity at work, school, and home. Lack of sleep may also put you at higher risk for accidents, suicide, and risky behaviors. If you do not get quality sleep, you may also be at higher risk for several health problems, including: Infections. Type 2 diabetes. Heart disease. High blood pressure. Obesity. Worsening of long-term conditions, like arthritis, kidney disease, depression, Parkinson's disease, and epilepsy. What actions can I take to get more quality sleep? Sleep schedule and routine Stick to a sleep schedule. Go to sleep and wake up at about the same time each day. Do not try to sleep less on weekdays and make up for lost sleep on weekends. This does not work. Limit naps during the day to 30 minutes or less. Do not take naps in the late afternoon. Make time to relax before bed. Reading, listening to music, or taking a hot bath promotes quality sleep. Make your bedroom a place that promotes quality sleep. Keep your bedroom dark, quiet, and at a comfortable room temperature. Make sure your bed is comfortable. Avoid using electronic devices that give off bright blue light for 30 minutes before bedtime. Your brain perceives bright blue light as sunlight. This includes television, phones, and computers. If you are lying awake in bed for longer than 20 minutes, get up and do a relaxing activity until you feel sleepy. Lifestyle     Try to get at least 30 minutes of exercise on most days. Do not exercise 2-3 hours  before going to bed. Do not use any products that contain nicotine or tobacco. These products include cigarettes, chewing tobacco, and vaping devices, such as e-cigarettes. If you need help quitting, ask your health care provider. Do not drink caffeinated beverages for at least 8 hours before going to bed. Coffee, tea, and some sodas contain caffeine. Do not drink alcohol or eat large meals close to  bedtime. Try to get at least 30 minutes of sunlight every day. Morning sunlight is best. Medical concerns Work with your health care provider to treat medical conditions that may affect sleeping, such as: Nasal obstruction. Snoring. Sleep apnea and other sleep disorders. Talk to your health care provider if you think any of your prescription medicines may cause you to have difficulty falling or staying asleep. If you have sleep problems, talk with a sleep consultant. If you think you have a sleep disorder, talk with your health care provider about getting evaluated by a specialist. Where to find more information Sleep Foundation: sleepfoundation.org American Academy of Sleep Medicine: aasm.org Centers for Disease Control and Prevention (CDC): TonerPromos.no Contact a health care provider if: You have trouble getting to sleep or staying asleep. You often wake up very early in the morning and cannot get back to sleep. You have daytime sleepiness. You have daytime sleep attacks of suddenly falling asleep and sudden muscle weakness (narcolepsy). You have a tingling sensation in your legs with a strong urge to move your legs (restless legs syndrome). You stop breathing briefly during sleep (sleep apnea). You think you have a sleep disorder or are taking a medicine that is affecting your quality of sleep. Summary Most adults need 7-8 hours of quality sleep each night. Getting enough quality sleep is important for your mental and physical health. Make your bedroom a place that promotes quality sleep, and avoid things that may cause you to have poor sleep, such as alcohol, caffeine, smoking, or large meals. Talk to your health care provider if you have trouble falling asleep or staying asleep. This information is not intended to replace advice given to you by your health care provider. Make sure you discuss any questions you have with your health care provider. Document Revised: 06/08/2021 Document  Reviewed: 06/08/2021 Elsevier Patient Education  2024 [employer].     s

## 2023-08-22 ENCOUNTER — Ambulatory Visit: Admitting: Physician Assistant

## 2023-08-22 ENCOUNTER — Other Ambulatory Visit

## 2023-08-22 ENCOUNTER — Encounter: Payer: Self-pay | Admitting: Physician Assistant

## 2023-08-22 ENCOUNTER — Telehealth: Payer: Self-pay

## 2023-08-22 ENCOUNTER — Ambulatory Visit: Payer: Self-pay | Admitting: Physician Assistant

## 2023-08-22 VITALS — BP 120/60 | HR 79 | Ht 71.0 in | Wt 158.0 lb

## 2023-08-22 DIAGNOSIS — Z860100 Personal history of colon polyps, unspecified: Secondary | ICD-10-CM | POA: Diagnosis not present

## 2023-08-22 DIAGNOSIS — E1122 Type 2 diabetes mellitus with diabetic chronic kidney disease: Secondary | ICD-10-CM

## 2023-08-22 DIAGNOSIS — R112 Nausea with vomiting, unspecified: Secondary | ICD-10-CM | POA: Diagnosis not present

## 2023-08-22 DIAGNOSIS — Z992 Dependence on renal dialysis: Secondary | ICD-10-CM

## 2023-08-22 DIAGNOSIS — D649 Anemia, unspecified: Secondary | ICD-10-CM

## 2023-08-22 DIAGNOSIS — N186 End stage renal disease: Secondary | ICD-10-CM

## 2023-08-22 DIAGNOSIS — R1013 Epigastric pain: Secondary | ICD-10-CM

## 2023-08-22 DIAGNOSIS — K5900 Constipation, unspecified: Secondary | ICD-10-CM

## 2023-08-22 DIAGNOSIS — K5904 Chronic idiopathic constipation: Secondary | ICD-10-CM

## 2023-08-22 DIAGNOSIS — Z794 Long term (current) use of insulin: Secondary | ICD-10-CM

## 2023-08-22 LAB — CBC WITH DIFFERENTIAL/PLATELET
Basophils Absolute: 0 10*3/uL (ref 0.0–0.1)
Basophils Relative: 0.4 % (ref 0.0–3.0)
Eosinophils Absolute: 0.1 10*3/uL (ref 0.0–0.7)
Eosinophils Relative: 1 % (ref 0.0–5.0)
HCT: 26.7 % — ABNORMAL LOW (ref 39.0–52.0)
Hemoglobin: 8.9 g/dL — ABNORMAL LOW (ref 13.0–17.0)
Lymphocytes Relative: 14.4 % (ref 12.0–46.0)
Lymphs Abs: 1.4 10*3/uL (ref 0.7–4.0)
MCHC: 33.4 g/dL (ref 30.0–36.0)
MCV: 97.1 fl (ref 78.0–100.0)
Monocytes Absolute: 1 10*3/uL (ref 0.1–1.0)
Monocytes Relative: 10.3 % (ref 3.0–12.0)
Neutro Abs: 7.4 10*3/uL (ref 1.4–7.7)
Neutrophils Relative %: 73.9 % (ref 43.0–77.0)
Platelets: 187 10*3/uL (ref 150.0–400.0)
RBC: 2.75 Mil/uL — ABNORMAL LOW (ref 4.22–5.81)
RDW: 16.7 % — ABNORMAL HIGH (ref 11.5–15.5)
WBC: 10 10*3/uL (ref 4.0–10.5)

## 2023-08-22 LAB — COMPREHENSIVE METABOLIC PANEL WITH GFR
ALT: 14 U/L (ref 0–53)
AST: 6 U/L (ref 0–37)
Albumin: 3.3 g/dL — ABNORMAL LOW (ref 3.5–5.2)
Alkaline Phosphatase: 42 U/L (ref 39–117)
BUN: 65 mg/dL — ABNORMAL HIGH (ref 6–23)
CO2: 29 meq/L (ref 19–32)
Calcium: 8.5 mg/dL (ref 8.4–10.5)
Chloride: 95 meq/L — ABNORMAL LOW (ref 96–112)
Creatinine, Ser: 12.1 mg/dL (ref 0.40–1.50)
GFR: 3.39 mL/min — CL (ref >60.00)
Glucose, Bld: 199 mg/dL — ABNORMAL HIGH (ref 70–99)
Potassium: 3.7 meq/L (ref 3.5–5.1)
Sodium: 138 meq/L (ref 135–145)
Total Bilirubin: 0.4 mg/dL (ref 0.2–1.2)
Total Protein: 6.3 g/dL (ref 6.0–8.3)

## 2023-08-22 LAB — LIPASE: Lipase: 46 U/L (ref 11.0–59.0)

## 2023-08-22 LAB — TSH: TSH: 2.9 u[IU]/mL (ref 0.35–5.50)

## 2023-08-22 LAB — SEDIMENTATION RATE: Sed Rate: 43 mm/h — ABNORMAL HIGH (ref 0–20)

## 2023-08-22 LAB — CORTISOL: Cortisol, Plasma: 10 ug/dL

## 2023-08-22 MED ORDER — LINACLOTIDE 145 MCG PO CAPS: 145.0000 ug | ORAL_CAPSULE | Freq: Every day | ORAL | Status: DC

## 2023-08-22 MED ORDER — OMEPRAZOLE 40 MG PO CPDR: DELAYED_RELEASE_CAPSULE | ORAL | 0 refills | Status: DC

## 2023-08-22 MED ORDER — ONDANSETRON HCL 4 MG PO TABS: 4.0000 mg | ORAL_TABLET | Freq: Three times a day (TID) | ORAL | 0 refills | Status: DC | PRN

## 2023-08-22 NOTE — Telephone Encounter (Signed)
 Responded, know kidney disease.

## 2023-08-22 NOTE — Telephone Encounter (Signed)
 Lab called with critical patient  creatine level of 12.1, GFR 3.38

## 2023-08-22 NOTE — Progress Notes (Addendum)
 08/22/2023 [name] [id number] 01/24/1934  Referring provider: Corlis Pagan, NP Primary GI doctor: [name]  ASSESSMENT AND PLAN:  Acute onset of epigastric AB burning and AB pain for 2-3 weeks since starting on peritoneal dialysis with nausea and vomiting 2-3 x a week, worse 20 mins after lying down, worse after food No dysphagia, no melena, no odynophagia Rare ETOH once a week, has not had in a month, no NSAIDS He is consistently cold, no fever 03/07/2023 CT abdomen pelvis with contrast for right lower quadrant abdominal pain fever and prostate cancer showed right greater than left lower lobe airspace disease central right lower lobe lobe bronchi possible aspiration pneumonia, no evidence of appendicitis resolution of ascites, large colonic stool burden, tiny right renal calculus Uncertain if this is dialysis related started with recent peritoneal dialysis, he has also been holding on to more fluid - patient high risk for procedures with comorbid conditions below, long discussion, prefers to check labs, get UGI and trial of omeprazole  and zofran  for a month before considering EGD in hospital setting - ER precautions discussed with the patient -TSH  -Random Cortisol -BMP/calcium , lipase - LDH/ESR - consider GES, gastroparesis diet given -Aggressive Bowel regimen with linzess  145 - follow up with nephrology and cardiology - 4 week follow up here in the office  Constipation - Increase fiber/ water  intake, decrease caffeine, increase activity level. - has failed miralax /lactulose  -Will do trial of linzess  145 mcg  Personal history of colon polyps Colonoscopy 2012 Dr. Arlys  Diffuse melanosis coli, two polyps, one at the cecum and one at the splenic flexure area. No recall due to age  CAD status post bypass/peripheral arterial disease Follows with Dr. Mona, last seen in the office 05/02/2023 01/2023 catheterization native LCA 100% CTO of LM and diffuse mild-moderate  disease in RCA widely patent LIMA to LAD 60% lesions in SVG to diagonal OM medically managed 02/14/2023 cardiac perfusion PET scan findings consistent with ischemia high risk Some fatigue with exertion, but no chest pain He is on imdur , not on blood thinner  CHF 01/2021 echo ejection fraction 60 to 65%, grade 1 diastolic dysfunction, mild to moderate aortic valve stenosis V-max measures 2.67M/S, aortic valve mean gradient 19  End-stage renal disease secondary type 2 diabetes  peritoneal dialysis everyday at home at night restarted June 6th/7th, was previously getting right upper chest catheter dialysis 3 x a week due to pneumonia/and fluid overload  Patient Care Team: Nathaniel Pagan, NP as PCP - General Nathaniel Vinie BROCKS, MD as PCP - Cardiology (Cardiology)  HISTORY OF PRESENT ILLNESS: 89 y.o. male with a past medical history listed below presents for evaluation of nausea, vomiting reflux.   Previously seen by Dr. Rollin 2019 for personal history of colon polyps and change in bowel habits.  Discussed the use of AI scribe software for clinical note transcription with the patient, who gave verbal consent to proceed.  History of Present Illness   [name] is an 89 year old male who presents with dysphagia and nausea.  He has been experiencing dysphagia for about three weeks, coinciding with the time he restarted peritoneal dialysis at home. He describes a sensation where food feels like it stops at the top of his stomach and does not progress, but he does not report that food gets stuck when eating solid foods like bread. This sensation is accompanied by nausea, which is more pronounced at night, waking him up around 2 AM. He experiences this  discomfort every evening after lying down for about 20 minutes.  He has a history of peritoneal dialysis, which he performs every night at home. He was previously on catheter dialysis for about a year and a half due to pneumonia and leg swelling, but  resumed peritoneal dialysis in June 2025. He has been on dialysis for a total of three years at home before the switch to catheter dialysis.  He underwent cardiac bypass surgery a long time ago and had a catheterization in December, which showed some plaque. No current chest pain or shortness of breath, although he notes fatigue when walking. He is not on low-dose aspirin  or blood thinners.  He experiences frequent hiccups, which have been occurring for the past two to three weeks. He also reports a burning sensation in the roof of his mouth when consuming spicy foods, although he has reduced his intake of such foods. He denies painful swallowing but notes a feeling of nausea and an unsettled stomach, particularly after eating.  He has a long-standing history of constipation since childhood. He has tried various treatments, including Miralax  and lactulose , but finds baking soda to be the most effective. He is currently on famotidine 10 mg and pantoprazole , although the dose of pantoprazole  is unclear.  He reports coldness but denies fever. He occasionally drinks wine, about once a month, and denies the use of NSAIDs or alcohol  regularly. He has not experienced any dark black stools.      He  reports that he quit smoking about 51 years ago. His smoking use included cigarettes. He started smoking about 71 years ago. He has a 20 pack-year smoking history. He has never used smokeless tobacco. He reports that he does not currently use alcohol . He reports that he does not use drugs.  RELEVANT GI HISTORY, IMAGING AND LABS: Results   RADIOLOGY CT abdomen: no significant findings other than constipation (02/2023)  DIAGNOSTIC Cardiac catheterization: revealed atherosclerotic plaque (01/2023)      CBC    Component Value Date/Time   WBC 10.0 03/08/2023 0529   RBC 2.79 (L) 03/08/2023 0529   HGB 9.2 (L) 03/08/2023 0529   HGB 10.6 (L) 02/16/2023 1433   HCT 28.1 (L) 03/08/2023 0529   HCT 31.6 (L)  02/16/2023 1433   PLT 229 03/08/2023 0529   PLT 253 02/16/2023 1433   MCV 100.7 (H) 03/08/2023 0529   MCV 98 (H) 02/16/2023 1433   MCH 33.0 03/08/2023 0529   MCHC 32.7 03/08/2023 0529   RDW 16.1 (H) 03/08/2023 0529   RDW 14.7 02/16/2023 1433   LYMPHSABS 0.6 (L) 03/07/2023 0427   MONOABS 0.8 03/07/2023 0427   EOSABS 0.0 03/07/2023 0427   BASOSABS 0.0 03/07/2023 0427   Recent Labs    02/16/23 1433 03/07/23 0427 03/07/23 1550 03/08/23 0529  HGB 10.6* 9.5* 9.6* 9.2*    CMP     Component Value Date/Time   NA 139 03/08/2023 0529   NA 140 02/16/2023 1433   K 5.0 03/08/2023 0529   CL 99 03/08/2023 0529   CO2 24 03/08/2023 0529   GLUCOSE 129 (H) 03/08/2023 0529   BUN 93 (H) 03/08/2023 0529   BUN 39 (H) 02/16/2023 1433   CREATININE 12.53 (H) 03/08/2023 0529   CALCIUM  9.1 03/08/2023 0529   PROT 6.0 (L) 03/07/2023 0745   PROT 6.3 07/05/2021 1045   ALBUMIN 2.7 (L) 03/07/2023 0745   ALBUMIN 3.8 07/05/2021 1045   AST 27 03/07/2023 0745   ALT 19 03/07/2023 0745  ALKPHOS 50 03/07/2023 0745   BILITOT 0.7 03/07/2023 0745   BILITOT 0.4 07/05/2021 1045   GFRNONAA 4 (L) 03/08/2023 0529   GFRAA 16 (L) 04/25/2019 0441      Latest Ref Rng & Units 03/07/2023    7:45 AM 07/04/2022    4:00 AM 07/03/2022    1:34 AM  Hepatic Function  Total Protein 6.5 - 8.1 g/dL 6.0     Albumin 3.5 - 5.0 g/dL 2.7  1.7  1.8   AST 15 - 41 U/L 27     ALT 0 - 44 U/L 19     Alk Phosphatase 38 - 126 U/L 50     Total Bilirubin 0.0 - 1.2 mg/dL 0.7         Current Medications:   Current Outpatient Medications (Endocrine & Metabolic):    insulin  glargine (LANTUS ) 100 UNIT/ML injection, Inject 10 Units into the skin daily as needed (high blood glucose).  Current Outpatient Medications (Cardiovascular):    atorvastatin  (LIPITOR ) 80 MG tablet, TAKE 1 TABLET BY MOUTH EVERY DAY   carvedilol  (COREG ) 3.125 MG tablet, TAKE 1 TABLET BY MOUTH TWICE A DAY WITH FOOD   furosemide  (LASIX ) 80 MG tablet, TAKE 1 TABLET (80  MG TOTAL) BY MOUTH 2 (TWO) TIMES DAILY AT 10 AM AND 5 PM.   isosorbide  mononitrate (IMDUR ) 30 MG 24 hr tablet, Take 1 tablet on day you do not have dialysis   nitroGLYCERIN  (NITROSTAT ) 0.4 MG SL tablet, Place 1 tablet (0.4 mg total) under the tongue every 5 (five) minutes x 3 doses as needed for chest pain.  Current Outpatient Medications (Respiratory):    fexofenadine (ALLEGRA) 180 MG tablet, Take 180 mg by mouth daily as needed for allergies or rhinitis.  Current Outpatient Medications (Analgesics):    aspirin  EC 81 MG tablet, Take 81 mg by mouth daily.  Current Outpatient Medications (Hematological):    Cyanocobalamin (VITAMIN B-12) 5000 MCG TBDP, Take 5,000 Units by mouth daily.   folic acid  (FOLVITE ) 800 MCG tablet, Take 400 mcg by mouth daily.  Current Outpatient Medications (Other):    Alpha-D-Galactosidase (BEANO PO), Take 1 tablet by mouth daily as needed (Gas).   diphenhydramine-acetaminophen  (TYLENOL  PM) 25-500 MG TABS tablet, Take 1 tablet by mouth at bedtime as needed (Sleep).   Emollient (GOLD BOND ULTIMATE HEALING) CREA, Apply 1 Application topically daily as needed (Moisturizer).   famotidine (PEPCID) 10 MG tablet, Take 10 mg by mouth daily as needed for heartburn or indigestion.   gentamicin  cream (GARAMYCIN ) 0.1 %, Apply 1 Application topically daily as needed (Rash).   Hydrocortisone Acetate 1 % CREA, Apply 1 application  topically daily as needed (Itching).   lactulose  (CHRONULAC ) 10 GM/15ML solution, SMARTSIG:30 Milliliter(s) By Mouth Daily PRN   melatonin 1 MG TABS tablet, Take 1 mg by mouth at bedtime as needed (Sleep).   Nutritional Supplements (,FEEDING SUPPLEMENT, PROSOURCE PLUS) liquid, Take 30 mLs by mouth 2 (two) times daily between meals. (Patient taking differently: Take 30 mLs by mouth every other day. premier protein)   omeprazole  (PRILOSEC) 40 MG capsule, Open capsule and sprinkle on food twice daily   ondansetron  (ZOFRAN ) 4 MG tablet, Take 1 tablet (4 mg  total) by mouth every 8 (eight) hours as needed for nausea or vomiting.   OVER THE COUNTER MEDICATION, Place 1 spray into both nostrils daily as needed (Congestion). mucinex saline spray   polyethylene glycol (MIRALAX  / GLYCOLAX ) 17 g packet, Take 17 g by mouth daily as needed for mild  constipation.   Propylene Glycol, PF, (SYSTANE COMPLETE PF) 0.6 % SOLN, Place 1 drop into both eyes daily.   sevelamer  carbonate (RENVELA ) 800 MG tablet, Take 2,400 mg by mouth 2 (two) times daily.   zolpidem (AMBIEN) 5 MG tablet, Take 5 mg by mouth at bedtime as needed.  Medical History:  Past Medical History:  Diagnosis Date   Acute coronary syndrome (HCC) 04/18/2019   Aortic stenosis 02/27/2020   mild to moderate AS   Arthritis    a little in LLE (06/20/2017)   Ataxia    Chronic diastolic CHF (congestive heart failure) (HCC)    Coronary artery disease    a. s/p CABG 1980s. b. stable by cath 05/2017.   Dizziness    Dyslipidemia    Edema 08/08/2017   HANDS & LOWER EXTREMITES   ESRD on hemodialysis (HCC)    Hypertension    Hypoalbuminemia    Nephrotic range proteinuria    Peripheral artery disease (HCC)    mild   Pneumonia ~ 1950 X 1   Prostate cancer (HCC) ~ 2015   Type II diabetes mellitus (HCC)    Allergies:  Allergies  Allergen Reactions   Clonidine  Hcl     Other reaction(s): constipation     Surgical History:  He  has a past surgical history that includes US  ECHOCARDIOGRAPHY (11/04/2008); NM MYOCAR PERF WALL MOTION (09/12/2006); Insertion prostate radiation seed (~ 2015); Cardiac catheterization; Coronary artery bypass graft (03/06/1985); Cataract extraction w/ intraocular lens  implant, bilateral (Bilateral); RIGHT/LEFT HEART CATH AND CORONARY/GRAFT ANGIOGRAPHY (N/A, 06/21/2017); Colonoscopy; Inguinal hernia repair (Right, 12/06/2020); IR US  Guide Vasc Access Right (06/26/2022); IR Fluoro Guide CV Line Right (06/26/2022); CAPD revision (N/A, 06/29/2022); laparoscopy (N/A, 06/29/2022);  Laparoscopic lysis of adhesions (N/A, 06/29/2022); Removal of a dialysis catheter (N/A, 07/04/2022); and LEFT HEART CATH AND CORS/GRAFTS ANGIOGRAPHY (N/A, 02/27/2023). Family History:  His family history includes Heart attack in his mother.  REVIEW OF SYSTEMS  : All other systems reviewed and negative except where noted in the History of Present Illness.  PHYSICAL EXAM: BP 120/60   Pulse 79   Ht 5' 11 (1.803 m)   Wt 158 lb (71.7 kg)   SpO2 94%   BMI 22.04 kg/m  Physical Exam   GENERAL APPEARANCE: Thin elderly appearing AA amle in no apparent distress. HEENT: No cervical lymphadenopathy, unremarkable thyroid , sclerae anicteric, conjunctiva pink, no thrush or lesions in throat. Has right upper cath port RESPIRATORY: Respiratory effort normal, BS equal bilateral without rales, rhonchi, wheezing. CARDIO: Loud systolic heart murmur, regular rhythm, peripheral pulses intact. ABDOMEN: Soft, non distended, active bowel sounds in all 4 quadrants, mild tenderness to palpation diffuse, has bandage right lower AB for peritoneal dialysis, no rebound, no mass appreciated. RECTAL: not examined MUSCULOSKELETAL: Full ROM, antalgic gait with cane, 1-2 + edema SKIN: Dry, intact without rashes or lesions. No jaundice. NEURO: Alert, oriented, no focal deficits. PSYCH: Cooperative, normal mood and affect.      Alan JONELLE Coombs, PA-C 3:01 PM

## 2023-08-22 NOTE — Patient Instructions (Addendum)
 Your provider has requested that you go to the basement level for lab work before leaving today. Press B on the elevator. The lab is located at the first door on the left as you exit the elevator.  Please take your proton pump inhibitor medication, omeprazole TWICE a day Take the zofran  at night every night and as needed during the day  Please take this medication 30 minutes to 1 hour before meals- this makes it more effective.  Avoid spicy and acidic foods Avoid fatty foods Limit your intake of coffee, tea, alcohol , and carbonated drinks Work to maintain a healthy weight Keep the head of the bed elevated at least 3 inches with blocks or a wedge pillow if you are having any nighttime symptoms Stay upright for 2 hours after eating Avoid meals and snacks three to four hours before bedtime  You have been scheduled for a gastric emptying scan at [facility] on 09-11-23 at 9am. Please arrive at least 30 minutes prior to your appointment for registration. Please make certain not to have anything to eat or drink after midnight the night before your test. Hold all stomach medications (ex: Zofran , phenergan , Reglan) 24 hours prior to your test. If you need to reschedule your appointment, please contact radiology scheduling at [phone]. _____________________________________________________________________ A gastric-emptying study measures how long it takes for food to move through your stomach. There are several ways to measure stomach emptying. In the most common test, you eat food that contains a small amount of radioactive material. A scanner that detects the movement of the radioactive material is placed over your abdomen to monitor the rate at which food leaves your stomach. This test normally takes about 4 hours to complete. _____________________________________________________________________   Linzess 145 mcg *IBS-C patients may begin to experience relief from belly pain and overall  abdominal symptoms (pain, discomfort, and bloating) in about 1 week,  with symptoms typically improving over 12 weeks.  Take at least 30 minutes before the first meal of the day on an empty stomach You can have a loose stool if you eat a high-fat breakfast. Give it at least 7 days, may have more bowel movements during that time.   The diarrhea should go away and you should start having normal, complete, full bowel movements.  It may be helpful to start treatment when you can be near the comfort of your own bathroom, such as a weekend.  After you are out we can send in a prescription if you did well, there is a prescription card  Toileting tips to help with your constipation - Drink at least 64-80 ounces of water /liquid per day. - Establish a time to try to move your bowels every day.  For many people, this is after a cup of coffee or after a meal such as breakfast. - Sit all of the way back on the toilet keeping your back fairly straight and while sitting up, try to rest the tops of your forearms on your upper thighs.   - Raising your feet with a step stool/squatty potty can be helpful to improve the angle that allows your stool to pass through the rectum. - Relax the rectum feeling it bulge toward the toilet water .  If you feel your rectum raising toward your body, you are contracting rather than relaxing. - Breathe in and slowly exhale. Belly breath by expanding your belly towards your belly button. Keep belly expanded as you gently direct pressure down and back to the anus.  A  low pitched GRRR sound can assist with increasing intra-abdominal pressure.  (Can also trying to blow on a pinwheel and make it move, this helps with the same belly breathing) - Repeat 3-4 times. If unsuccessful, contract the pelvic floor to restore normal tone and get off the toilet.  Avoid excessive straining. - To reduce excessive wiping by teaching your anus to normally contract, place hands on outer aspect of knees  and resist knee movement outward.  Hold 5-10 second then place hands just inside of knees and resist inward movement of knees.  Hold 5 seconds.  Repeat a few times each way.  Go to the ER if unable to pass gas, severe AB pain, unable to hold down food, any shortness of breath of chest pain.  Miralax  is an osmotic laxative.  It only brings more water  into the stool.  This is safe to take daily.  Can take up to 17 gram of miralax  twice a day.  Mix with juice or coffee.  Start 1 capful at night for 3-4 days and reassess your response in 3-4 days.  You can increase and decrease the dose based on your response.  Remember, it can take up to 3-4 days to take effect OR for the effects to wear off.   I often pair this with benefiber in the morning to help assure the stool is not too loose.   Gastroparesis Please do small frequent meals like 4-6 meals a day.  Eat and drink liquids at separate times.  Avoid high fiber foods, cook your vegetables, avoid high fat food.  Suggest spreading protein throughout the day (greek yogurt, glucerna, soft meat, milk, eggs) Choose soft foods that you can mash with a fork When you are more symptomatic, change to pureed foods foods and liquids.  Consider reading Living well with Gastroparesis by [facility] Gastroparesis is a condition in which food takes longer than normal to empty from the stomach. This condition is also known as delayed gastric emptying. It is usually a long-term (chronic) condition. There is no cure, but there are treatments and things that you can do at home to help relieve symptoms. Treating the underlying condition that causes gastroparesis can also help relieve symptoms What are the causes? In many cases, the cause of this condition is not known. Possible causes include: A hormone (endocrine) disorder, such as hypothyroidism or diabetes. A nervous system disease, such as Parkinson's disease or multiple sclerosis. Cancer,  infection, or surgery that affects the stomach or vagus nerve. The vagus nerve runs from your chest, through your neck, and to the lower part of your brain. A connective tissue disorder, such as scleroderma. Certain medicines. What increases the risk? You are more likely to develop this condition if: You have certain disorders or diseases. These may include: An endocrine disorder. An eating disorder. Amyloidosis. Scleroderma. Parkinson's disease. Multiple sclerosis. Cancer or infection of the stomach or the vagus nerve. You have had surgery on your stomach or vagus nerve. You take certain medicines. You are male. What are the signs or symptoms? Symptoms of this condition include: Feeling full after eating very little or a loss of appetite. Nausea, vomiting, or heartburn. Bloating of your abdomen. Inconsistent blood sugar (glucose) levels on blood tests. Unexplained weight loss. Acid from the stomach coming up into the esophagus (gastroesophageal reflux). Sudden tightening (spasm) of the stomach, which can be painful. Symptoms may come and go. Some people may not notice any symptoms. How is this diagnosed? This condition is  diagnosed with tests, such as: Tests that check how long it takes food to move through the stomach and intestines. These tests include: Upper gastrointestinal (GI) series. For this test, you drink a liquid that shows up well on X-rays, and then X-rays are taken of your intestines. Gastric emptying scintigraphy. For this test, you eat food that contains a small amount of radioactive material, and then scans are taken. Wireless capsule GI monitoring system. For this test, you swallow a pill (capsule) that records information about how foods and fluid move through your stomach. Gastric manometry. For this test, a tube is passed down your throat and into your stomach to measure electrical and muscular activity. Endoscopy. For this test, a long, thin tube with a  camera and light on the end is passed down your throat and into your stomach to check for problems in your stomach lining. Ultrasound. This test uses sound waves to create images of the inside of your body. This can help rule out gallbladder disease or pancreatitis as a cause of your symptoms. How is this treated? There is no cure for this condition, but treatment and home care may relieve symptoms. Treatment may include: Treating the underlying cause. Managing your symptoms by making changes to your diet and exercise habits. Taking medicines to control nausea and vomiting and to stimulate stomach muscles. Getting food through a feeding tube in the hospital. This may be done in severe cases. Having surgery to insert a device called a gastric electrical stimulator into your body. This device helps improve stomach emptying and control nausea and vomiting. Follow these instructions at home: Take over-the-counter and prescription medicines only as told by your health care provider. Follow instructions from your health care provider about eating or drinking restrictions. Your health care provider may recommend that you: Eat smaller meals more often. Eat low-fat foods. Eat low-fiber forms of high-fiber foods. For example, eat cooked vegetables instead of raw vegetables. Have only liquid foods instead of solid foods. Liquid foods are easier to digest. Drink enough fluid to keep your urine pale yellow. Exercise as often as told by your health care provider. Keep all follow-up visits. This is important. Contact a health care provider if you: Notice that your symptoms do not improve with treatment. Have new symptoms. Get help right away if you: Have severe pain in your abdomen that does not improve with treatment. Have nausea that is severe or does not go away. Vomit every time you drink fluids. Summary Gastroparesis is a long-term (chronic) condition in which food takes longer than normal to empty  from the stomach. Symptoms include nausea, vomiting, heartburn, bloating of your abdomen, and loss of appetite. Eating smaller portions, low-fat foods, and low-fiber forms of high-fiber foods may help you manage your symptoms. Get help right away if you have severe pain in your abdomen. This information is not intended to replace advice given to you by your health care provider. Make sure you discuss any questions you have with your health care provider. Document Revised: 06/23/2019 Document Reviewed: 06/23/2019 Elsevier Patient Education  2021 [employer].

## 2023-08-23 ENCOUNTER — Other Ambulatory Visit

## 2023-08-23 DIAGNOSIS — D649 Anemia, unspecified: Secondary | ICD-10-CM | POA: Diagnosis not present

## 2023-08-23 LAB — IBC + FERRITIN
Ferritin: 1091.9 ng/mL — ABNORMAL HIGH (ref 22.0–322.0)
Iron: 101 ug/dL (ref 42–165)
Saturation Ratios: 55.9 % — ABNORMAL HIGH (ref 20.0–50.0)
TIBC: 180.6 ug/dL — ABNORMAL LOW (ref 250.0–450.0)
Transferrin: 129 mg/dL — ABNORMAL LOW (ref 212.0–360.0)

## 2023-08-23 LAB — LACTATE DEHYDROGENASE: LDH: 393 U/L — ABNORMAL HIGH (ref 120–250)

## 2023-08-24 ENCOUNTER — Ambulatory Visit: Payer: Self-pay | Admitting: Physician Assistant

## 2023-08-24 NOTE — Telephone Encounter (Signed)
 Inbound call from patient spouse stating patient is experiencing vomiting. Please advise.   Thank you

## 2023-08-24 NOTE — Telephone Encounter (Signed)
 Called and spoke with patient's wife. Reporting same symptoms that he was evaluated for on 08/22/23. Patient has not started Zofran  or Omeprazole  40 mg BID as prescribed. Patient's wife states that he only had broth and a few spoonfuls of eggs yesterday. Not drinking many fluids at all and has not had any solid food today. I advised that patient to go to ER for evaluation especially due to his age and ESRD. Patient's wife states that she will try to get him to go, patient's wife was not aware of the prescriptions that were sent to the pharmacy. Patient's spouse reports that he takes several medications. I informed patient's spouse that he will likely not be able to keep medication down if he is barely drinking fluids. Patient's wife will try to get patient to ER, she verbalized understanding and had no additional concerns.

## 2023-08-29 ENCOUNTER — Other Ambulatory Visit: Payer: Self-pay | Admitting: Internal Medicine

## 2023-09-04 ENCOUNTER — Other Ambulatory Visit: Payer: Self-pay | Admitting: Internal Medicine

## 2023-09-11 ENCOUNTER — Ambulatory Visit (HOSPITAL_COMMUNITY): Attending: Physician Assistant

## 2023-09-12 NOTE — Progress Notes (Signed)
 Agree with the assessment and plan as outlined by Alan Coombs, PA-C.  The temporal relationship to restarting peritoneal dialysis is certainly interesting.  Would not expect it to be infectious given the intermittent nature and close association with postprandial and lying down, and otherwise no associated fevers.  May have irritation of the gastric serosal surface from dialysis and fluid shifting.  Similarly, his hiccups over the last couple weeks could be due to volume shifting and diaphragm irritation.  Lastly, agree with increasing bowel regimen as constipation may also be associated with his UGI symptoms.  Okay with proceeding with labs and less invasive testing in this 89 year old with multiple comorbidities.  If no clear etiology, may consider EGD in the hospital setting.  Could also consider repeat CT.  Aqil Goetting, DO, [facility]

## 2023-09-15 ENCOUNTER — Emergency Department (HOSPITAL_COMMUNITY)

## 2023-09-15 ENCOUNTER — Inpatient Hospital Stay (HOSPITAL_COMMUNITY)
Admission: EM | Admit: 2023-09-15 | Discharge: 2023-09-25 | DRG: 070 | Disposition: A | Discharge status: Hospice | Attending: Internal Medicine | Admitting: Internal Medicine

## 2023-09-15 ENCOUNTER — Other Ambulatory Visit: Payer: Self-pay

## 2023-09-15 ENCOUNTER — Encounter (HOSPITAL_COMMUNITY): Payer: Self-pay

## 2023-09-15 DIAGNOSIS — Z7982 Long term (current) use of aspirin: Secondary | ICD-10-CM

## 2023-09-15 DIAGNOSIS — E11649 Type 2 diabetes mellitus with hypoglycemia without coma: Secondary | ICD-10-CM | POA: Diagnosis not present

## 2023-09-15 DIAGNOSIS — R4182 Altered mental status, unspecified: Secondary | ICD-10-CM | POA: Diagnosis not present

## 2023-09-15 DIAGNOSIS — G9341 Metabolic encephalopathy: Secondary | ICD-10-CM | POA: Diagnosis not present

## 2023-09-15 DIAGNOSIS — Z9841 Cataract extraction status, right eye: Secondary | ICD-10-CM

## 2023-09-15 DIAGNOSIS — Z992 Dependence on renal dialysis: Secondary | ICD-10-CM

## 2023-09-15 DIAGNOSIS — I5042 Chronic combined systolic (congestive) and diastolic (congestive) heart failure: Secondary | ICD-10-CM | POA: Diagnosis present

## 2023-09-15 DIAGNOSIS — Z8701 Personal history of pneumonia (recurrent): Secondary | ICD-10-CM

## 2023-09-15 DIAGNOSIS — I739 Peripheral vascular disease, unspecified: Secondary | ICD-10-CM | POA: Diagnosis present

## 2023-09-15 DIAGNOSIS — Z79899 Other long term (current) drug therapy: Secondary | ICD-10-CM

## 2023-09-15 DIAGNOSIS — M19012 Primary osteoarthritis, left shoulder: Secondary | ICD-10-CM | POA: Diagnosis present

## 2023-09-15 DIAGNOSIS — Z961 Presence of intraocular lens: Secondary | ICD-10-CM | POA: Diagnosis present

## 2023-09-15 DIAGNOSIS — N2581 Secondary hyperparathyroidism of renal origin: Secondary | ICD-10-CM | POA: Diagnosis present

## 2023-09-15 DIAGNOSIS — I10 Essential (primary) hypertension: Secondary | ICD-10-CM | POA: Diagnosis present

## 2023-09-15 DIAGNOSIS — F0390 Unspecified dementia without behavioral disturbance: Secondary | ICD-10-CM | POA: Diagnosis present

## 2023-09-15 DIAGNOSIS — N186 End stage renal disease: Secondary | ICD-10-CM | POA: Diagnosis not present

## 2023-09-15 DIAGNOSIS — I132 Hypertensive heart and chronic kidney disease with heart failure and with stage 5 chronic kidney disease, or end stage renal disease: Secondary | ICD-10-CM | POA: Diagnosis present

## 2023-09-15 DIAGNOSIS — I509 Heart failure, unspecified: Secondary | ICD-10-CM

## 2023-09-15 DIAGNOSIS — E1122 Type 2 diabetes mellitus with diabetic chronic kidney disease: Secondary | ICD-10-CM | POA: Diagnosis not present

## 2023-09-15 DIAGNOSIS — R1310 Dysphagia, unspecified: Secondary | ICD-10-CM | POA: Diagnosis present

## 2023-09-15 DIAGNOSIS — Z8249 Family history of ischemic heart disease and other diseases of the circulatory system: Secondary | ICD-10-CM

## 2023-09-15 DIAGNOSIS — E785 Hyperlipidemia, unspecified: Secondary | ICD-10-CM | POA: Diagnosis present

## 2023-09-15 DIAGNOSIS — G9340 Encephalopathy, unspecified: Principal | ICD-10-CM | POA: Diagnosis present

## 2023-09-15 DIAGNOSIS — I2489 Other forms of acute ischemic heart disease: Secondary | ICD-10-CM | POA: Diagnosis present

## 2023-09-15 DIAGNOSIS — I255 Ischemic cardiomyopathy: Secondary | ICD-10-CM | POA: Diagnosis present

## 2023-09-15 DIAGNOSIS — Z9842 Cataract extraction status, left eye: Secondary | ICD-10-CM

## 2023-09-15 DIAGNOSIS — Z87891 Personal history of nicotine dependence: Secondary | ICD-10-CM

## 2023-09-15 DIAGNOSIS — I25810 Atherosclerosis of coronary artery bypass graft(s) without angina pectoris: Secondary | ICD-10-CM

## 2023-09-15 DIAGNOSIS — Z951 Presence of aortocoronary bypass graft: Secondary | ICD-10-CM

## 2023-09-15 DIAGNOSIS — I5030 Unspecified diastolic (congestive) heart failure: Secondary | ICD-10-CM

## 2023-09-15 DIAGNOSIS — I2510 Atherosclerotic heart disease of native coronary artery without angina pectoris: Secondary | ICD-10-CM | POA: Diagnosis present

## 2023-09-15 DIAGNOSIS — E1151 Type 2 diabetes mellitus with diabetic peripheral angiopathy without gangrene: Secondary | ICD-10-CM | POA: Diagnosis present

## 2023-09-15 DIAGNOSIS — R001 Bradycardia, unspecified: Secondary | ICD-10-CM | POA: Diagnosis not present

## 2023-09-15 DIAGNOSIS — D631 Anemia in chronic kidney disease: Secondary | ICD-10-CM | POA: Diagnosis present

## 2023-09-15 DIAGNOSIS — Z515 Encounter for palliative care: Secondary | ICD-10-CM

## 2023-09-15 DIAGNOSIS — M6250 Muscle wasting and atrophy, not elsewhere classified, unspecified site: Secondary | ICD-10-CM | POA: Diagnosis present

## 2023-09-15 DIAGNOSIS — Z66 Do not resuscitate: Secondary | ICD-10-CM | POA: Diagnosis present

## 2023-09-15 DIAGNOSIS — Z8546 Personal history of malignant neoplasm of prostate: Secondary | ICD-10-CM

## 2023-09-15 DIAGNOSIS — Z794 Long term (current) use of insulin: Secondary | ICD-10-CM

## 2023-09-15 DIAGNOSIS — N179 Acute kidney failure, unspecified: Secondary | ICD-10-CM | POA: Diagnosis present

## 2023-09-15 DIAGNOSIS — Z681 Body mass index (BMI) 19 or less, adult: Secondary | ICD-10-CM

## 2023-09-15 DIAGNOSIS — E119 Type 2 diabetes mellitus without complications: Secondary | ICD-10-CM

## 2023-09-15 DIAGNOSIS — I350 Nonrheumatic aortic (valve) stenosis: Secondary | ICD-10-CM | POA: Diagnosis present

## 2023-09-15 DIAGNOSIS — Z888 Allergy status to other drugs, medicaments and biological substances status: Secondary | ICD-10-CM

## 2023-09-15 DIAGNOSIS — I959 Hypotension, unspecified: Secondary | ICD-10-CM | POA: Diagnosis not present

## 2023-09-15 LAB — COMPREHENSIVE METABOLIC PANEL WITH GFR
ALT: <5 U/L (ref 0–44)
AST: 24 U/L (ref 15–41)
Albumin: 1.9 g/dL — ABNORMAL LOW (ref 3.5–5.0)
Alkaline Phosphatase: 53 U/L (ref 38–126)
Anion gap: 20 — ABNORMAL HIGH (ref 5–15)
BUN: 81 mg/dL — ABNORMAL HIGH (ref 8–23)
CO2: 26 mmol/L (ref 22–32)
Calcium: 8.2 mg/dL — ABNORMAL LOW (ref 8.9–10.3)
Chloride: 96 mmol/L — ABNORMAL LOW (ref 98–111)
Creatinine, Ser: 17.05 mg/dL — ABNORMAL HIGH (ref 0.61–1.24)
GFR, Estimated: 2 mL/min — ABNORMAL LOW (ref >60)
Glucose, Bld: 256 mg/dL — ABNORMAL HIGH (ref 70–99)
Potassium: 3.3 mmol/L — ABNORMAL LOW (ref 3.5–5.1)
Sodium: 142 mmol/L (ref 135–145)
Total Bilirubin: 0.6 mg/dL (ref 0.0–1.2)
Total Protein: 5.1 g/dL — ABNORMAL LOW (ref 6.5–8.1)

## 2023-09-15 LAB — CBG MONITORING, ED: Glucose-Capillary: 257 mg/dL — ABNORMAL HIGH (ref 70–99)

## 2023-09-15 LAB — CBC WITH DIFFERENTIAL/PLATELET
Abs Immature Granulocytes: 0.05 K/uL (ref 0.00–0.07)
Basophils Absolute: 0 K/uL (ref 0.0–0.1)
Basophils Relative: 0 %
Eosinophils Absolute: 0.1 K/uL (ref 0.0–0.5)
Eosinophils Relative: 1 %
HCT: 30.1 % — ABNORMAL LOW (ref 39.0–52.0)
Hemoglobin: 9.6 g/dL — ABNORMAL LOW (ref 13.0–17.0)
Immature Granulocytes: 1 %
Lymphocytes Relative: 9 %
Lymphs Abs: 0.9 K/uL (ref 0.7–4.0)
MCH: 32.9 pg (ref 26.0–34.0)
MCHC: 31.9 g/dL (ref 30.0–36.0)
MCV: 103.1 fL — ABNORMAL HIGH (ref 80.0–100.0)
Monocytes Absolute: 0.7 K/uL (ref 0.1–1.0)
Monocytes Relative: 7 %
Neutro Abs: 8.7 K/uL — ABNORMAL HIGH (ref 1.7–7.7)
Neutrophils Relative %: 82 %
Platelets: 173 K/uL (ref 150–400)
RBC: 2.92 MIL/uL — ABNORMAL LOW (ref 4.22–5.81)
RDW: 15.5 % (ref 11.5–15.5)
WBC: 10.4 K/uL (ref 4.0–10.5)
nRBC: 0.2 % (ref 0.0–0.2)

## 2023-09-15 LAB — TROPONIN I (HIGH SENSITIVITY)
Troponin I (High Sensitivity): 7244 ng/L (ref <18)
Troponin I (High Sensitivity): 5707 ng/L (ref <18)

## 2023-09-15 LAB — GLUCOSE, CAPILLARY: Glucose-Capillary: 319 mg/dL — ABNORMAL HIGH (ref 70–99)

## 2023-09-15 MED ORDER — POLYETHYLENE GLYCOL 3350 17 G PO PACK: 17.0000 g | PACK | Freq: Every day | ORAL | Status: DC | PRN

## 2023-09-15 MED ORDER — MELATONIN 3 MG PO TABS
1.5000 mg | ORAL_TABLET | Freq: Every evening | ORAL | Status: DC | PRN
  Administered 2023-09-15 – 2023-09-17 (×3): 1.5 mg via ORAL
  Filled 2023-09-15 (×3): qty 1

## 2023-09-15 MED ORDER — ACETAMINOPHEN 650 MG RE SUPP: 650.0000 mg | Freq: Four times a day (QID) | RECTAL | Status: DC | PRN

## 2023-09-15 MED ORDER — LINACLOTIDE 145 MCG PO CAPS
145.0000 ug | ORAL_CAPSULE | Freq: Every day | ORAL | Status: DC
  Administered 2023-09-16 – 2023-09-21 (×5): 145 ug via ORAL
  Filled 2023-09-15 (×10): qty 1

## 2023-09-15 MED ORDER — PROSOURCE PLUS PO LIQD
30.0000 mL | ORAL | Status: DC
  Administered 2023-09-15 – 2023-09-21 (×4): 30 mL via ORAL
  Filled 2023-09-15 (×4): qty 30

## 2023-09-15 MED ORDER — SODIUM CHLORIDE 0.9% FLUSH
3.0000 mL | Freq: Two times a day (BID) | INTRAVENOUS | Status: DC
  Administered 2023-09-15 – 2023-09-25 (×17): 3 mL via INTRAVENOUS

## 2023-09-15 MED ORDER — GENTAMICIN SULFATE 0.1 % EX CREA
1.0000 | TOPICAL_CREAM | Freq: Every day | CUTANEOUS | Status: DC
  Administered 2023-09-16: 1 via TOPICAL
  Filled 2023-09-15 (×2): qty 15

## 2023-09-15 MED ORDER — PANTOPRAZOLE SODIUM 40 MG PO TBEC
40.0000 mg | DELAYED_RELEASE_TABLET | Freq: Every day | ORAL | Status: DC
  Administered 2023-09-16 – 2023-09-21 (×6): 40 mg via ORAL
  Filled 2023-09-15 (×7): qty 1

## 2023-09-15 MED ORDER — ASPIRIN 81 MG PO TBEC
81.0000 mg | DELAYED_RELEASE_TABLET | Freq: Every day | ORAL | Status: DC
  Administered 2023-09-15 – 2023-09-21 (×7): 81 mg via ORAL
  Filled 2023-09-15 (×7): qty 1

## 2023-09-15 MED ORDER — ATORVASTATIN CALCIUM 80 MG PO TABS
80.0000 mg | ORAL_TABLET | Freq: Every day | ORAL | Status: DC
  Administered 2023-09-16 – 2023-09-21 (×6): 80 mg via ORAL
  Filled 2023-09-15 (×6): qty 1

## 2023-09-15 MED ORDER — ACETAMINOPHEN 325 MG PO TABS
650.0000 mg | ORAL_TABLET | Freq: Four times a day (QID) | ORAL | Status: DC | PRN
  Administered 2023-09-15 – 2023-09-21 (×3): 650 mg via ORAL
  Filled 2023-09-15 (×3): qty 2

## 2023-09-15 MED ORDER — CARVEDILOL 3.125 MG PO TABS
3.1250 mg | ORAL_TABLET | Freq: Two times a day (BID) | ORAL | Status: DC
  Administered 2023-09-16 – 2023-09-20 (×8): 3.125 mg via ORAL
  Filled 2023-09-15 (×8): qty 1

## 2023-09-15 MED ORDER — HEPARIN SODIUM (PORCINE) 5000 UNIT/ML IJ SOLN
5000.0000 [IU] | Freq: Three times a day (TID) | INTRAMUSCULAR | Status: DC
  Administered 2023-09-15 – 2023-09-21 (×18): 5000 [IU] via SUBCUTANEOUS
  Filled 2023-09-15 (×18): qty 1

## 2023-09-15 MED ORDER — INSULIN ASPART 100 UNIT/ML IJ SOLN: 0.0000 [IU] | Freq: Three times a day (TID) | INTRAMUSCULAR | Status: DC

## 2023-09-15 MED ORDER — FAMOTIDINE 20 MG PO TABS: 10.0000 mg | ORAL_TABLET | Freq: Every day | ORAL | Status: DC | PRN

## 2023-09-15 MED ORDER — SEVELAMER CARBONATE 800 MG PO TABS
2400.0000 mg | ORAL_TABLET | Freq: Two times a day (BID) | ORAL | Status: DC
  Administered 2023-09-15 – 2023-09-17 (×5): 2400 mg via ORAL
  Filled 2023-09-15 (×5): qty 3

## 2023-09-15 NOTE — H&P (Addendum)
 History and Physical   [name] FMW:[id number] DOB: 09-Jul-1933 DOA: 09/15/2023  PCP: Corlis Pagan, NP   Patient coming from: Home  Chief Complaint: Acute encephalopathy  HPI: [name] is a 89 y.o. male with medical history significant of hypertension, hyperlipidemia, ESRD on PD, PAD, CAD status post CABG, moderate aortic stenosis, chronic diastolic CHF presenting with confusion.  Patient has had some degree of confusion and decreased appetite for the past 4 days.  He does have some baseline dementia and history is provided with assistance of chart review and family.  In the past 2 days he is further developed nausea vomiting and diarrhea.  He is currently on PD but did not complete his last session as he disconnected it last night.  Has had some ongoing GI issues since starting PD and has been following with them for this.  Patient unable to provide accurate review of systems given baseline dementia and acute encephalopathy.  ED Course: Vital signs in the ED notable for blood pressure in the 100s and 130 systolic.  Lab workup included CMP with potassium 3.3, chloride 96, BUN 81, creatinine 17, normal bicarb, gap 20, glucose 256, calcium  8.2, protein 5.9, albumin 1.9.  CBC with hemoglobin stable at 9.6.  Urinalysis pending.  Chest x-ray with postop chest prominent central vasculature.  Right IJ catheter in place, tiny pleural effusions right greater than left.  CT head showed no acute abnormality.  Nephrology consulted in the ED and are planning to sample patient's peritoneal fluid and continue with peritoneal dialysis otherwise unless this is abnormal.  Review of Systems: As per HPI otherwise all other systems reviewed and are negative.  Past Medical History:  Diagnosis Date   Acute coronary syndrome (HCC) 04/18/2019   AMS (altered mental status) 03/07/2023   Aortic stenosis 02/27/2020   mild to moderate AS   Arthritis    a little in LLE (06/20/2017)   Ataxia    Chronic  diastolic CHF (congestive heart failure) (HCC)    Coronary artery disease    a. s/p CABG 1980s. b. stable by cath 05/2017.   Dizziness    Dyslipidemia    Edema 08/08/2017   HANDS & LOWER EXTREMITES   ESRD on hemodialysis (HCC)    Hypertension    Hypoalbuminemia    Nephrotic range proteinuria    Peripheral artery disease (HCC)    mild   Pneumonia ~ 1950 X 1   Prostate cancer (HCC) ~ 2015   Type II diabetes mellitus (HCC)     Past Surgical History:  Procedure Laterality Date   CAPD REVISION N/A 06/29/2022   Procedure: REPLACEMENT OF PERITONEAL DIALYSIS CATHETER;  Surgeon: Magda Debby SAILOR, MD;  Location: [facility] OR;  Service: Vascular;  Laterality: N/A;   CARDIAC CATHETERIZATION     a couple times (06/20/2017)   CATARACT EXTRACTION W/ INTRAOCULAR LENS  IMPLANT, BILATERAL Bilateral    COLONOSCOPY     CORONARY ARTERY BYPASS GRAFT  03/06/1985   LAD and first diagonal/circumflex (sequential saphenous vein graft,cardioplegia   INGUINAL HERNIA REPAIR Right 12/06/2020   Procedure: OPEN RIGHT INGUINAL HERNIA REPAIR;  Surgeon: Gladis Cough, MD;  Location: [facility] ORS;  Service: General;  Laterality: Right;   INSERTION PROSTATE RADIATION SEED  ~ 2015   IR FLUORO GUIDE CV LINE RIGHT  06/26/2022   IR US  GUIDE VASC ACCESS RIGHT  06/26/2022   LAPAROSCOPIC LYSIS OF ADHESIONS N/A 06/29/2022   Procedure: LAPAROSCOPIC LYSIS OF ADHESIONS;  Surgeon: Magda Debby SAILOR, MD;  Location:  [facility] OR;  Service: Vascular;  Laterality: N/A;   LAPAROSCOPY N/A 06/29/2022   Procedure: LAPAROSCOPY DIAGNOSTIC;  Surgeon: Magda Debby SAILOR, MD;  Location: [facility] OR;  Service: Vascular;  Laterality: N/A;   LEFT HEART CATH AND CORS/GRAFTS ANGIOGRAPHY N/A 02/27/2023   Procedure: LEFT HEART CATH AND CORS/GRAFTS ANGIOGRAPHY;  Surgeon: Anner Alm ORN, MD;  Location: [facility] INVASIVE CV LAB;  Service: Cardiovascular;  Laterality: N/A;   NM MYOCAR PERF WALL MOTION  09/12/2006   no ischemia   REMOVAL OF A DIALYSIS CATHETER N/A 07/04/2022   Procedure:  PERITONEAL CATHETER REMOVAL;  Surgeon: Lanis Fonda BRAVO, MD;  Location: [facility] OR;  Service: Vascular;  Laterality: N/A;   RIGHT/LEFT HEART CATH AND CORONARY/GRAFT ANGIOGRAPHY N/A 06/21/2017   Procedure: RIGHT/LEFT HEART CATH AND CORONARY/GRAFT ANGIOGRAPHY;  Surgeon: Verlin Lonni BIRCH, MD;  Location: [facility] INVASIVE CV LAB;  Service: Cardiovascular;  Laterality: N/A;   US  ECHOCARDIOGRAPHY  11/04/2008   trace TR & MR    Social History  reports that he quit smoking about 51 years ago. His smoking use included cigarettes. He started smoking about 71 years ago. He has a 20 pack-year smoking history. He has never used smokeless tobacco. He reports that he does not currently use alcohol . He reports that he does not use drugs.  Allergies  Allergen Reactions   Clonidine  Hcl     Other reaction(s): constipation    Family History  Problem Relation Age of Onset   Heart attack Mother   Reviewed on admission  Prior to Admission medications   Medication Sig Start Date End Date Taking? Authorizing Provider  Alpha-D-Galactosidase (BEANO PO) Take 1 tablet by mouth daily as needed (Gas).    [provider]  aspirin  EC 81 MG tablet Take 81 mg by mouth daily.    [provider]  atorvastatin  (LIPITOR ) 80 MG tablet TAKE 1 TABLET BY MOUTH EVERY DAY 09/04/23   Hilty, Vinie BROCKS, MD  carvedilol  (COREG ) 3.125 MG tablet TAKE 1 TABLET BY MOUTH TWICE A DAY WITH FOOD 08/29/23   Hilty, Vinie BROCKS, MD  Cyanocobalamin (VITAMIN B-12) 5000 MCG TBDP Take 5,000 Units by mouth daily.    [provider]  diphenhydramine-acetaminophen  (TYLENOL  PM) 25-500 MG TABS tablet Take 1 tablet by mouth at bedtime as needed (Sleep).    [provider]  Emollient (GOLD BOND ULTIMATE HEALING) CREA Apply 1 Application topically daily as needed (Moisturizer).    [provider]  famotidine  (PEPCID ) 10 MG tablet Take 10 mg by mouth daily as needed for heartburn or indigestion.    [provider]   fexofenadine (ALLEGRA) 180 MG tablet Take 180 mg by mouth daily as needed for allergies or rhinitis.    [provider]  folic acid  (FOLVITE ) 800 MCG tablet Take 400 mcg by mouth daily.    [provider]  furosemide  (LASIX ) 80 MG tablet TAKE 1 TABLET (80 MG TOTAL) BY MOUTH 2 (TWO) TIMES DAILY AT 10 AM AND 5 PM. 04/23/23   Hilty, Vinie BROCKS, MD  gentamicin  cream (GARAMYCIN ) 0.1 % Apply 1 Application topically daily as needed (Rash). 04/21/21   [provider]  Hydrocortisone Acetate 1 % CREA Apply 1 application  topically daily as needed (Itching).    [provider]  insulin  glargine (LANTUS ) 100 UNIT/ML injection Inject 10 Units into the skin daily as needed (high blood glucose). 10/29/19   [provider]  isosorbide  mononitrate (IMDUR ) 30 MG 24 hr tablet Take 1 tablet on day you do  not have dialysis 04/02/23   West, Katlyn D, NP  lactulose  (CHRONULAC ) 10 GM/15ML solution SMARTSIG:30 Milliliter(s) By Mouth Daily PRN 07/04/23   [provider]  linaclotide  (LINZESS ) 145 MCG CAPS capsule Take 1 capsule (145 mcg total) by mouth daily before breakfast. 08/22/23 08/16/24  Craig Alan SAUNDERS, PA-C  melatonin 1 MG TABS tablet Take 1 mg by mouth at bedtime as needed (Sleep).    [provider]  nitroGLYCERIN  (NITROSTAT ) 0.4 MG SL tablet Place 1 tablet (0.4 mg total) under the tongue every 5 (five) minutes x 3 doses as needed for chest pain. 04/28/19   Hilty, Vinie BROCKS, MD  Nutritional Supplements (,FEEDING SUPPLEMENT, PROSOURCE PLUS) liquid Take 30 mLs by mouth 2 (two) times daily between meals. Patient taking differently: Take 30 mLs by mouth every other day. premier protein 07/04/22   Tobie Yetta HERO, MD  omeprazole  (PRILOSEC) 40 MG capsule Open capsule and sprinkle on food twice daily 08/22/23   Collier, Amanda R, PA-C  ondansetron  (ZOFRAN ) 4 MG tablet Take 1 tablet (4 mg total) by mouth every 8 (eight) hours as needed for nausea or vomiting. 08/22/23    Craig Alan SAUNDERS, PA-C  OVER THE COUNTER MEDICATION Place 1 spray into both nostrils daily as needed (Congestion). mucinex saline spray    [provider]  polyethylene glycol (MIRALAX  / GLYCOLAX ) 17 g packet Take 17 g by mouth daily as needed for mild constipation. 07/04/22   Tobie Yetta HERO, MD  Propylene Glycol, PF, (SYSTANE COMPLETE PF) 0.6 % SOLN Place 1 drop into both eyes daily.    [provider]  sevelamer  carbonate (RENVELA ) 800 MG tablet Take 2,400 mg by mouth 2 (two) times daily. 11/09/21   [provider]  zolpidem (AMBIEN) 5 MG tablet Take 5 mg by mouth at bedtime as needed. 03/25/23   [provider]    Physical Exam: Vitals:   09/15/23 1430 09/15/23 1445 09/15/23 1500 09/15/23 1530  BP: 122/64 (!) 127/58 132/62 137/64  Pulse: 77  80 80  Resp: (!) 22 15 16 12   Temp:    97.8 F (36.6 C)  TempSrc:    Oral  SpO2:   100% 100%  Weight:      Height:        Physical Exam Constitutional:      General: He is not in acute distress.    Appearance: Normal appearance.  HENT:     Head: Normocephalic and atraumatic.     Mouth/Throat:     Mouth: Mucous membranes are moist.     Pharynx: Oropharynx is clear.  Eyes:     Extraocular Movements: Extraocular movements intact.     Pupils: Pupils are equal, round, and reactive to light.  Cardiovascular:     Rate and Rhythm: Normal rate and regular rhythm.     Pulses: Normal pulses.     Heart sounds: Normal heart sounds.  Pulmonary:     Effort: Pulmonary effort is normal. No respiratory distress.     Breath sounds: Normal breath sounds.  Abdominal:     General: Bowel sounds are normal. There is no distension.     Palpations: Abdomen is soft.     Tenderness: There is no abdominal tenderness.  Musculoskeletal:        General: No swelling or deformity.  Skin:    General: Skin is warm and dry.  Neurological:     General: No focal deficit present.     Mental Status: He is disoriented.  Labs on  Admission: I have personally reviewed following labs and imaging studies  CBC: Recent Labs  Lab 09/15/23 1142  WBC 10.4  NEUTROABS 8.7*  HGB 9.6*  HCT 30.1*  MCV 103.1*  PLT 173    Basic Metabolic Panel: Recent Labs  Lab 09/15/23 1142  NA 142  K 3.3*  CL 96*  CO2 26  GLUCOSE 256*  BUN 81*  CREATININE 17.05*  CALCIUM  8.2*    GFR: Estimated Creatinine Clearance: 3 mL/min (A) (by C-G formula based on SCr of 17.05 mg/dL (H)).  Liver Function Tests: Recent Labs  Lab 09/15/23 1142  AST 24  ALT <5  ALKPHOS 53  BILITOT 0.6  PROT 5.1*  ALBUMIN 1.9*    Urine analysis:    Component Value Date/Time   COLORURINE YELLOW 03/07/2023 0635   APPEARANCEUR CLEAR 03/07/2023 0635   LABSPEC 1.012 03/07/2023 0635   PHURINE 8.0 03/07/2023 0635   GLUCOSEU NEGATIVE 03/07/2023 0635   HGBUR SMALL (A) 03/07/2023 0635   BILIRUBINUR NEGATIVE 03/07/2023 0635   KETONESUR NEGATIVE 03/07/2023 0635   PROTEINUR 100 (A) 03/07/2023 0635   NITRITE NEGATIVE 03/07/2023 0635   LEUKOCYTESUR NEGATIVE 03/07/2023 0635    Radiological Exams on Admission: CT HEAD WO CONTRAST ( ) Result Date: 09/15/2023 CLINICAL DATA:  89 year old male with altered mental status and decreased appetite for 4 days. Peritoneal dialysis patient. EXAM: CT HEAD WITHOUT CONTRAST TECHNIQUE: Contiguous axial images were obtained from the base of the skull through the vertex without intravenous contrast. RADIATION DOSE REDUCTION: This exam was performed according to the departmental dose-optimization program which includes automated exposure control, adjustment of the mA and/or kV according to patient size and/or use of iterative reconstruction technique. COMPARISON:  Head CT 03/07/2023.  Brain MRI 08/27/2021. FINDINGS: Brain: Stable cerebral volume. No midline shift, ventriculomegaly, mass effect, evidence of mass lesion, intracranial hemorrhage or evidence of cortically based acute infarction. Stable gray-white matter  differentiation throughout the brain. Patchy and confluent chronic white matter hypodensity. Vascular: Calcified atherosclerosis at the skull base. No suspicious intracranial vascular hyperdensity. Skull: Stable and intact.  No acute osseous abnormality identified. Sinuses/Orbits: Chronic left maxillary sinusitis. Other Visualized paranasal sinuses and mastoids are stable and well aerated. Other: Calcified scalp vessel atherosclerosis diffusely. No acute orbit soft tissue finding. IMPRESSION: 1. No acute intracranial abnormality. 2. Stable non contrast CT appearance of advanced cerebral white matter disease. 3. Advanced calcified atherosclerosis. Electronically Signed   By: VEAR Hurst M.D.   On: 09/15/2023 12:48   DG Chest Portable 1 View Result Date: 09/15/2023 CLINICAL DATA:  Weakness EXAM: PORTABLE CHEST 1 VIEW COMPARISON:  03/07/2023 FINDINGS: Stable double-lumen right IJ catheter. Status post median sternotomy. Stable cardiopericardial silhouette calcified aorta. Slight prominence of central vasculature. Tiny pleural effusions identified with some mild lung base opacities. No pneumothorax. Overlapping cardiac leads. IMPRESSION: Postop chest with prominent central vasculature.  Right IJ catheter. Tiny pleural effusions with some adjacent opacity, right-greater-than-left. Recommend follow-up. Electronically Signed   By: Ranell Bring M.D.   On: 09/15/2023 12:33   EKG: Independently reviewed.  Sinus rhythm at 82 bpm.  Nonspecific T wave flattening with suspected inferolateral ST depression.  Evidence of LVH with related repolarization abnormality  Assessment/Plan Active Problems:   Essential hypertension   DM2 (diabetes mellitus, type 2) (HCC)   ESRD on peritoneal dialysis (HCC)   Hx of CABG   Dyslipidemia   PAD (peripheral artery disease) (HCC)   Hypertension   Coronary artery disease   Congestive heart failure (HCC)  DNR (do not resuscitate)   Encephalopathy Baseline dementia > Baseline  confusion/likely early dementia.  Increased confusion and decreased appetite for the past 4 days with nausea, vomiting, diarrhea for the past 2 days.  Has had ongoing issues with the symptoms as below. > No obvious infectious etiology noted for multiple status with no leukocytosis, no opacity on chest x-ray.  Urinalysis is still pending as patient makes minimal urine. > CT head showed no acute abnormality.  Other lab work notable for mildly low potassium at 3.3, BUN 81, was in the 90s 6 months ago, though this still may be playing a role in his encephalopathy versus delirium.  > TSH was done outpatient on 6/25 and was normal. > Nephrology has been consulted as patient is on PD and plan to sample his peritoneal fluid. > Etiology is likely related to his ongoing GI symptoms to delirium in the setting of baseline dementia. - Monitor on telemetry overnight - Sample peritoneal fluid and continue with PD per nephrology - Will check troponin given his nonspecific presentation and history of extensive CAD and possible inferolateral ST-depressions - Trend renal function and electrolytes - Trend fever curve and WBC - Supportive care  GI symptoms > Has had various ongoing GI issues since starting peritoneal dialysis.  Last GI note is from 6/25 describes epigastric burning and abdominal pain worse with food and lying down with associated nausea and vomiting several times a week.  Infectious etiology not suspected, starting with aggressive bowel regimen, lab workup, PPI, Zofran  prior to considering EGD. - Continue with Pepcid , PPI, Linzess  - As needed MiraLAX   PAD CAD > Status post CABG, 37 years ago.  > 01/2023 catheterization native: LCA 100% CTO of LM and diffuse mild-moderate disease in RCA widely patent LIMA to LAD 60% lesions in SVG to diagonal OM.  No further intervention at that time, medically managed - Continue home aspirin  atorvastatin  - Checking Troponin as above ADDENDUM > Initial Troponin  >7000.  Discussed briefly with cardiology on-call.  Recommended getting delta troponin and consulting if significant rise (repeat greater than 9000).  Cardiology on-call noted patient had some nonspecific T wave changes/mild T wave depression on prior EKGs as well. > Patient has known severe disease not chosen for any intervention during recent cath in December of last year showed would expected patient to be a candidate for intervention now.  Decision would be about starting heparin  while undergoing further evaluation. - Check delta trop - No further intervention for now.  ESRD on PD - Appreciate nephrology recommendations and assistance - Continue with PD per nephrology - Peritoneal fluid sampling - Continue Renvela  and feeding supplement  Diabetes - SSI  Chronic diastolic CHF > Last echo was in 2024 with EF 65%, indeterminate diastolic function, normal RV function. - On PD as above - Hold lasix  for now  DVT prophylaxis: Heparin  Code Status:   DNR, okay with intubation  Family Communication:  Updated at bedside  Disposition Plan:   Patient is from:  Home  Anticipated DC to:  Home  Anticipated DC date:  1-2 days  Anticipated DC barriers: None  Consults called:  Nephrology Admission status:  Observation, telemetry  Severity of Illness: The appropriate patient status for this patient is OBSERVATION. Observation status is judged to be reasonable and necessary in order to provide the required intensity of service to ensure the patient's safety. The patient's presenting symptoms, physical exam findings, and initial radiographic and laboratory data in the context of their medical condition  is felt to place them at decreased risk for further clinical deterioration. Furthermore, it is anticipated that the patient will be medically stable for discharge from the hospital within 2 midnights of admission.    Marsa KATHEE Scurry MD [facility]  How to contact the [facility] Attending or  Consulting provider 7A - 7P or covering provider during after hours 7P -7A, for this patient?   Check the care team in [facility] and look for a) attending/consulting [facility] provider listed and b) the [facility] team listed Log into [email or web address] and use [facility]'s universal password to access. If you do not have the password, please contact the hospital operator. Locate the [facility] provider you are looking for under [facility] and page to a number that you can be directly reached. If you still have difficulty reaching the provider, please page the [facility] (Director on Call) for the Hospitalists listed on amion for assistance.  09/15/2023, 3:57 PM

## 2023-09-15 NOTE — ED Notes (Signed)
 Patient leaving the floor in stable condition, with family and staff.

## 2023-09-15 NOTE — Consult Note (Signed)
 [facility] Renal Consultation Note    Indication for Consultation:  Management of ESRD/hemodialysis; anemia, hypertension/volume and secondary hyperparathyroidism  ERE:Ujuz, Izetta, NP  HPI: [name] is a 89 y.o. male. ESRD on CAPD. Past medical history significant for CAD s/p CABG, AS, HTN, HLD, PAD, DMT2, prostate cancer and dementia.   Patient presented to the ED due to AMS.  Alert but not oriented.  Wife provides history.  Reports worsened mental status over the last 4 days.  Last at baseline on Monday. Has not been giving him any of his medications for the last couple weeks due to nausea and vomiting that occurs almost every day.  Also has been having diarrhea.  Appetite decreased, did not eat at all yesterday.   Last night he unhooked his PD catheter and she woke up to fluid all over the floor this morning with his PD catheter hanging down uncovered.  Prior to this he had no trouble with PD per wife. Per outpatient HD notes, recently treated for peritonitis.  Having issues with him shortening treatments.   No fevers or chills.  Patient denies any symptoms.  No chest pain, abdominal pain, shortness of breath, weakness, dizziness, nausea, vomiting or diarrhea.  HD catheter remains in place in R upper chest.  Dressing last changed on Monday. States they were waiting to see if CAPD was working prior to removing it.    Pertinent findings in work up include CXR with tiny pleural effusions with some adjacent opacity, right-greater-than-left, CT head with no acute findings, BUN 81, SCr 17 and K 3.3.  Patient admitted for further evaluation and management.   Past Medical History:  Diagnosis Date   Acute coronary syndrome (HCC) 04/18/2019   Aortic stenosis 02/27/2020   mild to moderate AS   Arthritis    a little in LLE (06/20/2017)   Ataxia    Chronic diastolic CHF (congestive heart failure) (HCC)    Coronary artery disease    a. s/p CABG 1980s. b. stable by cath 05/2017.    Dizziness    Dyslipidemia    Edema 08/08/2017   HANDS & LOWER EXTREMITES   ESRD on hemodialysis (HCC)    Hypertension    Hypoalbuminemia    Nephrotic range proteinuria    Peripheral artery disease (HCC)    mild   Pneumonia ~ 1950 X 1   Prostate cancer (HCC) ~ 2015   Type II diabetes mellitus (HCC)    Past Surgical History:  Procedure Laterality Date   CAPD REVISION N/A 06/29/2022   Procedure: REPLACEMENT OF PERITONEAL DIALYSIS CATHETER;  Surgeon: Magda Debby SAILOR, MD;  Location: [facility] OR;  Service: Vascular;  Laterality: N/A;   CARDIAC CATHETERIZATION     a couple times (06/20/2017)   CATARACT EXTRACTION W/ INTRAOCULAR LENS  IMPLANT, BILATERAL Bilateral    COLONOSCOPY     CORONARY ARTERY BYPASS GRAFT  03/06/1985   LAD and first diagonal/circumflex (sequential saphenous vein graft,cardioplegia   INGUINAL HERNIA REPAIR Right 12/06/2020   Procedure: OPEN RIGHT INGUINAL HERNIA REPAIR;  Surgeon: Gladis Cough, MD;  Location: [facility] ORS;  Service: General;  Laterality: Right;   INSERTION PROSTATE RADIATION SEED  ~ 2015   IR FLUORO GUIDE CV LINE RIGHT  06/26/2022   IR US  GUIDE VASC ACCESS RIGHT  06/26/2022   LAPAROSCOPIC LYSIS OF ADHESIONS N/A 06/29/2022   Procedure: LAPAROSCOPIC LYSIS OF ADHESIONS;  Surgeon: Magda Debby SAILOR, MD;  Location: [facility] OR;  Service: Vascular;  Laterality: N/A;   LAPAROSCOPY N/A  06/29/2022   Procedure: LAPAROSCOPY DIAGNOSTIC;  Surgeon: Magda Debby SAILOR, MD;  Location: [facility] OR;  Service: Vascular;  Laterality: N/A;   LEFT HEART CATH AND CORS/GRAFTS ANGIOGRAPHY N/A 02/27/2023   Procedure: LEFT HEART CATH AND CORS/GRAFTS ANGIOGRAPHY;  Surgeon: Anner Alm ORN, MD;  Location: [facility] INVASIVE CV LAB;  Service: Cardiovascular;  Laterality: N/A;   NM MYOCAR PERF WALL MOTION  09/12/2006   no ischemia   REMOVAL OF A DIALYSIS CATHETER N/A 07/04/2022   Procedure: PERITONEAL CATHETER REMOVAL;  Surgeon: Lanis Fonda BRAVO, MD;  Location: [facility] OR;  Service: Vascular;  Laterality: N/A;    RIGHT/LEFT HEART CATH AND CORONARY/GRAFT ANGIOGRAPHY N/A 06/21/2017   Procedure: RIGHT/LEFT HEART CATH AND CORONARY/GRAFT ANGIOGRAPHY;  Surgeon: Verlin Lonni BIRCH, MD;  Location: [facility] INVASIVE CV LAB;  Service: Cardiovascular;  Laterality: N/A;   US  ECHOCARDIOGRAPHY  11/04/2008   trace TR & MR   Family History  Problem Relation Age of Onset   Heart attack Mother    Social History:  reports that he quit smoking about 51 years ago. His smoking use included cigarettes. He started smoking about 71 years ago. He has a 20 pack-year smoking history. He has never used smokeless tobacco. He reports that he does not currently use alcohol . He reports that he does not use drugs. Allergies  Allergen Reactions   Clonidine  Hcl     Other reaction(s): constipation   Prior to Admission medications   Medication Sig Start Date End Date Taking? Authorizing Provider  Alpha-D-Galactosidase (BEANO PO) Take 1 tablet by mouth daily as needed (Gas).    [provider]  aspirin  EC 81 MG tablet Take 81 mg by mouth daily.    [provider]  atorvastatin  (LIPITOR ) 80 MG tablet TAKE 1 TABLET BY MOUTH EVERY DAY 09/04/23   Hilty, Vinie BROCKS, MD  carvedilol  (COREG ) 3.125 MG tablet TAKE 1 TABLET BY MOUTH TWICE A DAY WITH FOOD 08/29/23   Hilty, Vinie BROCKS, MD  Cyanocobalamin (VITAMIN B-12) 5000 MCG TBDP Take 5,000 Units by mouth daily.    [provider]  diphenhydramine-acetaminophen  (TYLENOL  PM) 25-500 MG TABS tablet Take 1 tablet by mouth at bedtime as needed (Sleep).    [provider]  Emollient (GOLD BOND ULTIMATE HEALING) CREA Apply 1 Application topically daily as needed (Moisturizer).    [provider]  famotidine  (PEPCID ) 10 MG tablet Take 10 mg by mouth daily as needed for heartburn or indigestion.    [provider]  fexofenadine (ALLEGRA) 180 MG tablet Take 180 mg by mouth daily as needed for allergies or rhinitis.    [provider]  folic acid   (FOLVITE ) 800 MCG tablet Take 400 mcg by mouth daily.    [provider]  furosemide  (LASIX ) 80 MG tablet TAKE 1 TABLET (80 MG TOTAL) BY MOUTH 2 (TWO) TIMES DAILY AT 10 AM AND 5 PM. 04/23/23   Hilty, Vinie BROCKS, MD  gentamicin  cream (GARAMYCIN ) 0.1 % Apply 1 Application topically daily as needed (Rash). 04/21/21   [provider]  Hydrocortisone Acetate 1 % CREA Apply 1 application  topically daily as needed (Itching).    [provider]  insulin  glargine (LANTUS ) 100 UNIT/ML injection Inject 10 Units into the skin daily as needed (high blood glucose). 10/29/19   [provider]  isosorbide  mononitrate (IMDUR ) 30 MG 24 hr tablet Take 1 tablet on day you do not have dialysis 04/02/23   West, Katlyn D, NP  lactulose  (CHRONULAC ) 10 GM/15ML solution SMARTSIG:30 Milliliter(s)  By Mouth Daily PRN 07/04/23   [provider]  linaclotide  (LINZESS ) 145 MCG CAPS capsule Take 1 capsule (145 mcg total) by mouth daily before breakfast. 08/22/23 08/16/24  Craig Alan SAUNDERS, PA-C  melatonin 1 MG TABS tablet Take 1 mg by mouth at bedtime as needed (Sleep).    [provider]  nitroGLYCERIN  (NITROSTAT ) 0.4 MG SL tablet Place 1 tablet (0.4 mg total) under the tongue every 5 (five) minutes x 3 doses as needed for chest pain. 04/28/19   Hilty, Vinie BROCKS, MD  Nutritional Supplements (,FEEDING SUPPLEMENT, PROSOURCE PLUS) liquid Take 30 mLs by mouth 2 (two) times daily between meals. Patient taking differently: Take 30 mLs by mouth every other day. premier protein 07/04/22   Tobie Yetta HERO, MD  omeprazole  (PRILOSEC) 40 MG capsule Open capsule and sprinkle on food twice daily 08/22/23   Collier, Amanda R, PA-C  ondansetron  (ZOFRAN ) 4 MG tablet Take 1 tablet (4 mg total) by mouth every 8 (eight) hours as needed for nausea or vomiting. 08/22/23   Craig Alan SAUNDERS, PA-C  OVER THE COUNTER MEDICATION Place 1 spray into both nostrils daily as needed (Congestion). mucinex saline spray     [provider]  polyethylene glycol (MIRALAX  / GLYCOLAX ) 17 g packet Take 17 g by mouth daily as needed for mild constipation. 07/04/22   Tobie Yetta HERO, MD  Propylene Glycol, PF, (SYSTANE COMPLETE PF) 0.6 % SOLN Place 1 drop into both eyes daily.    [provider]  sevelamer  carbonate (RENVELA ) 800 MG tablet Take 2,400 mg by mouth 2 (two) times daily. 11/09/21   [provider]  zolpidem (AMBIEN) 5 MG tablet Take 5 mg by mouth at bedtime as needed. 03/25/23   [provider]   Current Facility-Administered Medications  Medication Dose Route Frequency Provider Last Rate Last Admin   gentamicin  cream (GARAMYCIN ) 0.1 % 1 Application  1 Application Topical Daily Ileene Allie, Manuelita, [place]       Current Outpatient Medications  Medication Sig Dispense Refill   Alpha-D-Galactosidase (BEANO PO) Take 1 tablet by mouth daily as needed (Gas).     aspirin  EC 81 MG tablet Take 81 mg by mouth daily.     atorvastatin  (LIPITOR ) 80 MG tablet TAKE 1 TABLET BY MOUTH EVERY DAY 90 tablet 2   carvedilol  (COREG ) 3.125 MG tablet TAKE 1 TABLET BY MOUTH TWICE A DAY WITH FOOD 180 tablet 2   Cyanocobalamin (VITAMIN B-12) 5000 MCG TBDP Take 5,000 Units by mouth daily.     diphenhydramine-acetaminophen  (TYLENOL  PM) 25-500 MG TABS tablet Take 1 tablet by mouth at bedtime as needed (Sleep).     Emollient (GOLD BOND ULTIMATE HEALING) CREA Apply 1 Application topically daily as needed (Moisturizer).     famotidine  (PEPCID ) 10 MG tablet Take 10 mg by mouth daily as needed for heartburn or indigestion.     fexofenadine (ALLEGRA) 180 MG tablet Take 180 mg by mouth daily as needed for allergies or rhinitis.     folic acid  (FOLVITE ) 800 MCG tablet Take 400 mcg by mouth daily.     furosemide  (LASIX ) 80 MG tablet TAKE 1 TABLET (80 MG TOTAL) BY MOUTH 2 (TWO) TIMES DAILY AT 10 AM AND 5 PM. 180 tablet 3   gentamicin  cream (GARAMYCIN ) 0.1 % Apply 1 Application topically daily as needed (Rash).      Hydrocortisone Acetate 1 % CREA Apply 1 application  topically daily as needed (Itching).     insulin  glargine (LANTUS ) 100 UNIT/ML injection  Inject 10 Units into the skin daily as needed (high blood glucose).     isosorbide  mononitrate (IMDUR ) 30 MG 24 hr tablet Take 1 tablet on day you do not have dialysis 45 tablet 3   lactulose  (CHRONULAC ) 10 GM/15ML solution SMARTSIG:30 Milliliter(s) By Mouth Daily PRN     linaclotide  (LINZESS ) 145 MCG CAPS capsule Take 1 capsule (145 mcg total) by mouth daily before breakfast.     melatonin 1 MG TABS tablet Take 1 mg by mouth at bedtime as needed (Sleep).     nitroGLYCERIN  (NITROSTAT ) 0.4 MG SL tablet Place 1 tablet (0.4 mg total) under the tongue every 5 (five) minutes x 3 doses as needed for chest pain. 25 tablet 4   Nutritional Supplements (,FEEDING SUPPLEMENT, PROSOURCE PLUS) liquid Take 30 mLs by mouth 2 (two) times daily between meals. (Patient taking differently: Take 30 mLs by mouth every other day. premier protein) 887 mL 0   omeprazole  (PRILOSEC) 40 MG capsule Open capsule and sprinkle on food twice daily 120 capsule 0   ondansetron  (ZOFRAN ) 4 MG tablet Take 1 tablet (4 mg total) by mouth every 8 (eight) hours as needed for nausea or vomiting. 30 tablet 0   OVER THE COUNTER MEDICATION Place 1 spray into both nostrils daily as needed (Congestion). mucinex saline spray     polyethylene glycol (MIRALAX  / GLYCOLAX ) 17 g packet Take 17 g by mouth daily as needed for mild constipation. 14 each 0   Propylene Glycol, PF, (SYSTANE COMPLETE PF) 0.6 % SOLN Place 1 drop into both eyes daily.     sevelamer  carbonate (RENVELA ) 800 MG tablet Take 2,400 mg by mouth 2 (two) times daily.     zolpidem (AMBIEN) 5 MG tablet Take 5 mg by mouth at bedtime as needed.     Labs: Basic Metabolic Panel: Recent Labs  Lab 09/15/23 1142  NA 142  K 3.3*  CL 96*  CO2 26  GLUCOSE 256*  BUN 81*  CREATININE 17.05*  CALCIUM  8.2*   Liver Function Tests: Recent Labs  Lab  09/15/23 1142  AST 24  ALT <5  ALKPHOS 53  BILITOT 0.6  PROT 5.1*  ALBUMIN 1.9*   CBC: Recent Labs  Lab 09/15/23 1142  WBC 10.4  NEUTROABS 8.7*  HGB 9.6*  HCT 30.1*  MCV 103.1*  PLT 173   CBG: Recent Labs  Lab 09/15/23 1129  GLUCAP 257*   Studies/Results: CT HEAD WO CONTRAST ( ) Result Date: 09/15/2023 CLINICAL DATA:  89 year old male with altered mental status and decreased appetite for 4 days. Peritoneal dialysis patient. EXAM: CT HEAD WITHOUT CONTRAST TECHNIQUE: Contiguous axial images were obtained from the base of the skull through the vertex without intravenous contrast. RADIATION DOSE REDUCTION: This exam was performed according to the departmental dose-optimization program which includes automated exposure control, adjustment of the mA and/or kV according to patient size and/or use of iterative reconstruction technique. COMPARISON:  Head CT 03/07/2023.  Brain MRI 08/27/2021. FINDINGS: Brain: Stable cerebral volume. No midline shift, ventriculomegaly, mass effect, evidence of mass lesion, intracranial hemorrhage or evidence of cortically based acute infarction. Stable gray-white matter differentiation throughout the brain. Patchy and confluent chronic white matter hypodensity. Vascular: Calcified atherosclerosis at the skull base. No suspicious intracranial vascular hyperdensity. Skull: Stable and intact.  No acute osseous abnormality identified. Sinuses/Orbits: Chronic left maxillary sinusitis. Other Visualized paranasal sinuses and mastoids are stable and well aerated. Other: Calcified scalp vessel atherosclerosis diffusely. No acute orbit soft tissue finding. IMPRESSION: 1. No acute  intracranial abnormality. 2. Stable non contrast CT appearance of advanced cerebral white matter disease. 3. Advanced calcified atherosclerosis. Electronically Signed   By: VEAR Hurst M.D.   On: 09/15/2023 12:48   DG Chest Portable 1 View Result Date: 09/15/2023 CLINICAL DATA:  Weakness EXAM:  PORTABLE CHEST 1 VIEW COMPARISON:  03/07/2023 FINDINGS: Stable double-lumen right IJ catheter. Status post median sternotomy. Stable cardiopericardial silhouette calcified aorta. Slight prominence of central vasculature. Tiny pleural effusions identified with some mild lung base opacities. No pneumothorax. Overlapping cardiac leads. IMPRESSION: Postop chest with prominent central vasculature.  Right IJ catheter. Tiny pleural effusions with some adjacent opacity, right-greater-than-left. Recommend follow-up. Electronically Signed   By: Ranell Bring M.D.   On: 09/15/2023 12:33    ROS: All others negative except those listed in HPI.  General: No weight loss, fever, chills  HEENT: No recent headaches, nasal bleeding,  visual changes, sore throat or dysphagia Neurologic: No dizziness, blackouts, seizures. No recent symptoms of stroke or mini- stroke. No recent episodes of slurred speech, or temporary blindness.  Cardiac: No recent episodes of chest pain/pressure,  shortness of breath at rest or DOE.  Vascular: No history of rest pain in feet, claudication, nonhealing ulcers  or DVT  Pulmonary: No home oxygen,no cough, hemoptysis, or wheezing  Musculoskeletal: no arthritis, low back pain, or joint pain  Hematologic:No history of hypercoagulable state. No history of easy bleeding. No history of anemia  Gastrointestinal: No hematochezia or melena, No gastroesophageal reflux, no trouble swallowing  Urinary: Anuric or makes small amount of urine, no nurning with urination or frequency   Skin: No rashes or lesions Psychological: No  anxiety or depression   Physical Exam: Vitals:   09/15/23 1430 09/15/23 1445 09/15/23 1500 09/15/23 1530  BP: 122/64 (!) 127/58 132/62 137/64  Pulse: 77  80 80  Resp: (!) 22 15 16 12   Temp:    97.8 F (36.6 C)  TempSrc:    Oral  SpO2:   100% 100%  Weight:      Height:         General: Pleasantly confused, elderly male in NAD Head: NCAT sclera not icteric MMM Neck:  Supple. No lymphadenopathy Lungs: CTA bilaterally. No wheeze, rales or rhonchi. Breathing is unlabored. Heart: RRR. + 2/6 systolic murmur Abdomen: soft, nontender, +BS, no guarding, no rebound tenderness, PD catheter in place Lower extremities:no edema, ischemic changes, or open wounds  Neuro: Alert, oriented to self only. Moves all extremities spontaneously. Psych:  Responds to questions appropriately with a normal affect. Dialysis Access: [facility], PD catheter  Dialysis Orders:  CAPD - 5 dwells, 2L, 1.5hrs edw 64kg   Assessment/Plan:  AMS - baseline dementia.  Recent peritonitis.  Possible re-occurrence - check cell count, gram stain and culture. CXR with small opacity. Further work up pending.   ESRD -  on PD.  Plan for PD tonight as noted above.  Orders in, nursing staff notified.  If needed has HD catheter and can do HD.    Hypertension/volume  - BP in goal without medications.  Does not appear volume overloaded.  Will use 1.5% bags tonight.  Anemia of CKD - Hgb 9.6.  Follow trends. Last mircera dose 5/17.  If Hgb dropping will start again here.   Secondary Hyperparathyroidism -  CCa ok, check phos. Continue home meds  Nutrition - Regular diet with fluid restrictions due to low K.  Manuelita Labella, PA-C [facility] 09/15/2023, 3:40 PM

## 2023-09-15 NOTE — Plan of Care (Signed)
 Elevated troponin secondary to demand ischemia and ESRD patient with poor troponin clearance Initial troponin was 7244 has been trended down to 5707. Patient does not have any chest discomfort and pain. Per chart review patient has been admitted for encephalopathy in the setting of recent nausea vomiting and diarrhea.  Patient has history of extensive CAD and PAD. Creatinine initially patient admitted and found to have elevated troponin 7000 admitting physician discussed case with on-call cardiology recommended if repeat troponin check showed elevation greater than 9000 or positive delta change in that case need to inform on-call cardiology and at this time there is no indication for IV heparin  drip. -Given troponin has been trending down and patient does not have any evidence of chest pain at this time there is no indication for IV heparin  drip.  Continue aspirin , Lipitor  and DVT prophylaxis. -Continue trend troponin and monitor for development of any chest pain. -Obtaining echocardiogram to assess for any wall motion abnormality. -Continue to trend troponin.  Continue cardiac monitoring.

## 2023-09-15 NOTE — ED Notes (Signed)
 EDP at bedside

## 2023-09-15 NOTE — ED Triage Notes (Signed)
 BIB [facility] from home. Per wife pt has had AMS and decreased appetite since Tuesday.  NVD X2 days. Peritoneal dialysis every night. Did not complete dialysis overnight per wife.  Aox2 to place and person.

## 2023-09-15 NOTE — ED Notes (Signed)
 Patients wife states patient produces very little urine and not very often. EDP made aware.

## 2023-09-15 NOTE — ED Provider Notes (Signed)
 [place] EMERGENCY DEPARTMENT AT [facility] Provider Note   CSN: [id number] Arrival date & time: 09/15/23  1111     Patient presents with: Altered Mental Status   [name] is a 89 y.o. male.    Altered Mental Status Patient reportedly sent in for mental status change.  Decreased appetite.  Has been reportedly doing worse since Tuesday with today being Saturday.  Does peritoneal dialysis and reportedly did not finish it last night.  Patient has dementia and really cannot provide much history.     Prior to Admission medications   Medication Sig Start Date End Date Taking? Authorizing Provider  Alpha-D-Galactosidase (BEANO PO) Take 1 tablet by mouth daily as needed (Gas).    [provider]  aspirin  EC 81 MG tablet Take 81 mg by mouth daily.    [provider]  atorvastatin  (LIPITOR ) 80 MG tablet TAKE 1 TABLET BY MOUTH EVERY DAY 09/04/23   Hilty, Vinie BROCKS, MD  carvedilol  (COREG ) 3.125 MG tablet TAKE 1 TABLET BY MOUTH TWICE A DAY WITH FOOD 08/29/23   Hilty, Vinie BROCKS, MD  Cyanocobalamin (VITAMIN B-12) 5000 MCG TBDP Take 5,000 Units by mouth daily.    [provider]  diphenhydramine-acetaminophen  (TYLENOL  PM) 25-500 MG TABS tablet Take 1 tablet by mouth at bedtime as needed (Sleep).    [provider]  Emollient (GOLD BOND ULTIMATE HEALING) CREA Apply 1 Application topically daily as needed (Moisturizer).    [provider]  famotidine  (PEPCID ) 10 MG tablet Take 10 mg by mouth daily as needed for heartburn or indigestion.    [provider]  fexofenadine (ALLEGRA) 180 MG tablet Take 180 mg by mouth daily as needed for allergies or rhinitis.    [provider]  folic acid  (FOLVITE ) 800 MCG tablet Take 400 mcg by mouth daily.    [provider]  furosemide  (LASIX ) 80 MG tablet TAKE 1 TABLET (80 MG TOTAL) BY MOUTH 2 (TWO) TIMES DAILY AT 10 AM AND 5 PM. 04/23/23   Hilty, Vinie BROCKS, MD  gentamicin  cream  (GARAMYCIN ) 0.1 % Apply 1 Application topically daily as needed (Rash). 04/21/21   [provider]  Hydrocortisone Acetate 1 % CREA Apply 1 application  topically daily as needed (Itching).    [provider]  insulin  glargine (LANTUS ) 100 UNIT/ML injection Inject 10 Units into the skin daily as needed (high blood glucose). 10/29/19   [provider]  isosorbide  mononitrate (IMDUR ) 30 MG 24 hr tablet Take 1 tablet on day you do not have dialysis 04/02/23   West, Katlyn D, NP  lactulose  (CHRONULAC ) 10 GM/15ML solution SMARTSIG:30 Milliliter(s) By Mouth Daily PRN 07/04/23   [provider]  linaclotide  (LINZESS ) 145 MCG CAPS capsule Take 1 capsule (145 mcg total) by mouth daily before breakfast. 08/22/23 08/16/24  Craig Alan SAUNDERS, PA-C  melatonin 1 MG TABS tablet Take 1 mg by mouth at bedtime as needed (Sleep).    [provider]  nitroGLYCERIN  (NITROSTAT ) 0.4 MG SL tablet Place 1 tablet (0.4 mg total) under the tongue every 5 (five) minutes x 3 doses as needed for chest pain. 04/28/19   Hilty, Vinie BROCKS, MD  Nutritional Supplements (,FEEDING SUPPLEMENT, PROSOURCE PLUS) liquid Take 30 mLs by mouth 2 (two) times daily between meals. Patient taking differently: Take 30 mLs by mouth every other day. premier protein 07/04/22   Tobie Yetta HERO, MD  omeprazole  (PRILOSEC) 40 MG capsule Open capsule and sprinkle on food twice daily 08/22/23  Craig Alan SAUNDERS, PA-C  ondansetron  (ZOFRAN ) 4 MG tablet Take 1 tablet (4 mg total) by mouth every 8 (eight) hours as needed for nausea or vomiting. 08/22/23   Craig Alan SAUNDERS, PA-C  OVER THE COUNTER MEDICATION Place 1 spray into both nostrils daily as needed (Congestion). mucinex saline spray    [provider]  polyethylene glycol (MIRALAX  / GLYCOLAX ) 17 g packet Take 17 g by mouth daily as needed for mild constipation. 07/04/22   Tobie Yetta HERO, MD  Propylene Glycol, PF, (SYSTANE COMPLETE PF) 0.6 % SOLN Place 1 drop into both  eyes daily.    [provider]  sevelamer  carbonate (RENVELA ) 800 MG tablet Take 2,400 mg by mouth 2 (two) times daily. 11/09/21   [provider]  zolpidem (AMBIEN) 5 MG tablet Take 5 mg by mouth at bedtime as needed. 03/25/23   [provider]    Allergies: Clonidine  hcl    Review of Systems  Updated Vital Signs BP 137/64   Pulse 80   Temp 97.8 F (36.6 C) (Oral)   Resp 12   Ht 5' 11 (1.803 m)   Wt 71.7 kg   SpO2 100%   BMI 22.05 kg/m   Physical Exam Vitals and nursing note reviewed.  Cardiovascular:     Rate and Rhythm: Normal rate.  Pulmonary:     Breath sounds: No wheezing or rhonchi.     Comments: Dialysis catheter right upper chest wall. Abdominal:     Tenderness: There is no abdominal tenderness.     Comments: Peritoneal dialysis catheter.  Skin:    General: Skin is warm.  Neurological:     Mental Status: He is alert.     (all labs ordered are listed, but only abnormal results are displayed) Labs Reviewed  COMPREHENSIVE METABOLIC PANEL WITH GFR - Abnormal; Notable for the following components:      Result Value   Potassium 3.3 (*)    Chloride 96 (*)    Glucose, Bld 256 (*)    BUN 81 (*)    Creatinine, Ser 17.05 (*)    Calcium  8.2 (*)    Total Protein 5.1 (*)    Albumin 1.9 (*)    GFR, Estimated 2 (*)    Anion gap 20 (*)    All other components within normal limits  CBC WITH DIFFERENTIAL/PLATELET - Abnormal; Notable for the following components:   RBC 2.92 (*)    Hemoglobin 9.6 (*)    HCT 30.1 (*)    MCV 103.1 (*)    Neutro Abs 8.7 (*)    All other components within normal limits  CBG MONITORING, ED - Abnormal; Notable for the following components:   Glucose-Capillary 257 (*)    All other components within normal limits  BODY FLUID CULTURE W GRAM STAIN  URINALYSIS, W/ REFLEX TO CULTURE (INFECTION SUSPECTED)  BODY FLUID CELL COUNT WITH DIFFERENTIAL    EKG: None  Radiology: CT HEAD WO CONTRAST ( ) Result Date:  09/15/2023 CLINICAL DATA:  89 year old male with altered mental status and decreased appetite for 4 days. Peritoneal dialysis patient. EXAM: CT HEAD WITHOUT CONTRAST TECHNIQUE: Contiguous axial images were obtained from the base of the skull through the vertex without intravenous contrast. RADIATION DOSE REDUCTION: This exam was performed according to the departmental dose-optimization program which includes automated exposure control, adjustment of the mA and/or kV according to patient size and/or use of iterative reconstruction technique. COMPARISON:  Head CT 03/07/2023.  Brain MRI 08/27/2021. FINDINGS: Brain: Stable  cerebral volume. No midline shift, ventriculomegaly, mass effect, evidence of mass lesion, intracranial hemorrhage or evidence of cortically based acute infarction. Stable gray-white matter differentiation throughout the brain. Patchy and confluent chronic white matter hypodensity. Vascular: Calcified atherosclerosis at the skull base. No suspicious intracranial vascular hyperdensity. Skull: Stable and intact.  No acute osseous abnormality identified. Sinuses/Orbits: Chronic left maxillary sinusitis. Other Visualized paranasal sinuses and mastoids are stable and well aerated. Other: Calcified scalp vessel atherosclerosis diffusely. No acute orbit soft tissue finding. IMPRESSION: 1. No acute intracranial abnormality. 2. Stable non contrast CT appearance of advanced cerebral white matter disease. 3. Advanced calcified atherosclerosis. Electronically Signed   By: VEAR Hurst M.D.   On: 09/15/2023 12:48   DG Chest Portable 1 View Result Date: 09/15/2023 CLINICAL DATA:  Weakness EXAM: PORTABLE CHEST 1 VIEW COMPARISON:  03/07/2023 FINDINGS: Stable double-lumen right IJ catheter. Status post median sternotomy. Stable cardiopericardial silhouette calcified aorta. Slight prominence of central vasculature. Tiny pleural effusions identified with some mild lung base opacities. No pneumothorax. Overlapping cardiac  leads. IMPRESSION: Postop chest with prominent central vasculature.  Right IJ catheter. Tiny pleural effusions with some adjacent opacity, right-greater-than-left. Recommend follow-up. Electronically Signed   By: Ranell Bring M.D.   On: 09/15/2023 12:33     Procedures   Medications Ordered in the ED  gentamicin  cream (GARAMYCIN ) 0.1 % 1 Application (has no administration in time range)                                    Medical Decision Making Amount and/or Complexity of Data Reviewed Labs: ordered. Radiology: ordered.  Risk Decision regarding hospitalization.   Patient reportedly brought in for mental status change.  Decreased oral intake.  However patient cannot provide much history.  Wife reportedly on the way.  Differential diagnosis does include dehydration and infection.  Patient's wife is now here.  States has been delirious.  Not eating.  Reportedly disconnected himself last night from the peritoneal dialysis machine.  This is not his baseline.  Has recently been treated for infection through the dialysis center.  She thinks it was in his kidneys but is not sure.  Does make some urine.  Will now add urinalysis.  Have been unable to get urine.  Nephrology aware of patient.  Admit to hospital for     Final diagnoses:  Encephalopathy, unspecified type  End stage renal disease on dialysis [facility])    ED Discharge Orders     None          Patsey Lot, MD 09/15/23 1607

## 2023-09-16 ENCOUNTER — Observation Stay (HOSPITAL_COMMUNITY)

## 2023-09-16 DIAGNOSIS — I5040 Unspecified combined systolic (congestive) and diastolic (congestive) heart failure: Secondary | ICD-10-CM | POA: Diagnosis not present

## 2023-09-16 DIAGNOSIS — F0390 Unspecified dementia without behavioral disturbance: Secondary | ICD-10-CM | POA: Diagnosis present

## 2023-09-16 DIAGNOSIS — N179 Acute kidney failure, unspecified: Secondary | ICD-10-CM | POA: Diagnosis present

## 2023-09-16 DIAGNOSIS — N186 End stage renal disease: Secondary | ICD-10-CM | POA: Diagnosis present

## 2023-09-16 DIAGNOSIS — I5020 Unspecified systolic (congestive) heart failure: Secondary | ICD-10-CM | POA: Diagnosis not present

## 2023-09-16 DIAGNOSIS — Z8249 Family history of ischemic heart disease and other diseases of the circulatory system: Secondary | ICD-10-CM | POA: Diagnosis not present

## 2023-09-16 DIAGNOSIS — Z794 Long term (current) use of insulin: Secondary | ICD-10-CM | POA: Diagnosis not present

## 2023-09-16 DIAGNOSIS — E1151 Type 2 diabetes mellitus with diabetic peripheral angiopathy without gangrene: Secondary | ICD-10-CM | POA: Diagnosis present

## 2023-09-16 DIAGNOSIS — I2510 Atherosclerotic heart disease of native coronary artery without angina pectoris: Secondary | ICD-10-CM | POA: Diagnosis present

## 2023-09-16 DIAGNOSIS — Z992 Dependence on renal dialysis: Secondary | ICD-10-CM | POA: Diagnosis not present

## 2023-09-16 DIAGNOSIS — N2581 Secondary hyperparathyroidism of renal origin: Secondary | ICD-10-CM | POA: Diagnosis present

## 2023-09-16 DIAGNOSIS — Z7189 Other specified counseling: Secondary | ICD-10-CM | POA: Diagnosis not present

## 2023-09-16 DIAGNOSIS — Z515 Encounter for palliative care: Secondary | ICD-10-CM | POA: Diagnosis not present

## 2023-09-16 DIAGNOSIS — Z681 Body mass index (BMI) 19 or less, adult: Secondary | ICD-10-CM | POA: Diagnosis not present

## 2023-09-16 DIAGNOSIS — I25810 Atherosclerosis of coronary artery bypass graft(s) without angina pectoris: Secondary | ICD-10-CM | POA: Diagnosis not present

## 2023-09-16 DIAGNOSIS — I5042 Chronic combined systolic (congestive) and diastolic (congestive) heart failure: Secondary | ICD-10-CM

## 2023-09-16 DIAGNOSIS — R4182 Altered mental status, unspecified: Secondary | ICD-10-CM | POA: Diagnosis present

## 2023-09-16 DIAGNOSIS — I2489 Other forms of acute ischemic heart disease: Secondary | ICD-10-CM | POA: Diagnosis present

## 2023-09-16 DIAGNOSIS — I132 Hypertensive heart and chronic kidney disease with heart failure and with stage 5 chronic kidney disease, or end stage renal disease: Secondary | ICD-10-CM | POA: Diagnosis present

## 2023-09-16 DIAGNOSIS — E1122 Type 2 diabetes mellitus with diabetic chronic kidney disease: Secondary | ICD-10-CM | POA: Diagnosis present

## 2023-09-16 DIAGNOSIS — R7989 Other specified abnormal findings of blood chemistry: Secondary | ICD-10-CM | POA: Diagnosis not present

## 2023-09-16 DIAGNOSIS — Z79899 Other long term (current) drug therapy: Secondary | ICD-10-CM | POA: Diagnosis not present

## 2023-09-16 DIAGNOSIS — G9340 Encephalopathy, unspecified: Secondary | ICD-10-CM | POA: Diagnosis not present

## 2023-09-16 DIAGNOSIS — E785 Hyperlipidemia, unspecified: Secondary | ICD-10-CM | POA: Diagnosis present

## 2023-09-16 DIAGNOSIS — Z66 Do not resuscitate: Secondary | ICD-10-CM | POA: Diagnosis present

## 2023-09-16 DIAGNOSIS — I429 Cardiomyopathy, unspecified: Secondary | ICD-10-CM | POA: Diagnosis not present

## 2023-09-16 DIAGNOSIS — Z87891 Personal history of nicotine dependence: Secondary | ICD-10-CM | POA: Diagnosis not present

## 2023-09-16 DIAGNOSIS — D631 Anemia in chronic kidney disease: Secondary | ICD-10-CM | POA: Diagnosis present

## 2023-09-16 DIAGNOSIS — E11649 Type 2 diabetes mellitus with hypoglycemia without coma: Secondary | ICD-10-CM | POA: Diagnosis not present

## 2023-09-16 DIAGNOSIS — I255 Ischemic cardiomyopathy: Secondary | ICD-10-CM | POA: Diagnosis present

## 2023-09-16 DIAGNOSIS — G9341 Metabolic encephalopathy: Secondary | ICD-10-CM | POA: Diagnosis present

## 2023-09-16 DIAGNOSIS — I350 Nonrheumatic aortic (valve) stenosis: Secondary | ICD-10-CM | POA: Diagnosis present

## 2023-09-16 LAB — CBC WITH DIFFERENTIAL/PLATELET
Abs Immature Granulocytes: 0.05 K/uL (ref 0.00–0.07)
Basophils Absolute: 0 K/uL (ref 0.0–0.1)
Basophils Relative: 0 %
Eosinophils Absolute: 0.1 K/uL (ref 0.0–0.5)
Eosinophils Relative: 1 %
HCT: 30.5 % — ABNORMAL LOW (ref 39.0–52.0)
Hemoglobin: 9.8 g/dL — ABNORMAL LOW (ref 13.0–17.0)
Immature Granulocytes: 1 %
Lymphocytes Relative: 10 %
Lymphs Abs: 0.9 K/uL (ref 0.7–4.0)
MCH: 32.3 pg (ref 26.0–34.0)
MCHC: 32.1 g/dL (ref 30.0–36.0)
MCV: 100.7 fL — ABNORMAL HIGH (ref 80.0–100.0)
Monocytes Absolute: 0.8 K/uL (ref 0.1–1.0)
Monocytes Relative: 9 %
Neutro Abs: 6.5 K/uL (ref 1.7–7.7)
Neutrophils Relative %: 79 %
Platelets: 165 K/uL (ref 150–400)
RBC: 3.03 MIL/uL — ABNORMAL LOW (ref 4.22–5.81)
RDW: 15.2 % (ref 11.5–15.5)
WBC: 8.3 K/uL (ref 4.0–10.5)
nRBC: 0 % (ref 0.0–0.2)

## 2023-09-16 LAB — ECHOCARDIOGRAM COMPLETE
AR max vel: 0.72 cm2
AV Area VTI: 0.73 cm2
AV Area mean vel: 0.66 cm2
AV Mean grad: 31 mmHg
AV Peak grad: 54 mmHg
Ao pk vel: 3.68 m/s
Area-P 1/2: 3.31 cm2
Height: 71 in
S' Lateral: 4.4 cm
Weight: 2529.12 [oz_av]

## 2023-09-16 LAB — GLUCOSE, CAPILLARY
Glucose-Capillary: 287 mg/dL — ABNORMAL HIGH (ref 70–99)
Glucose-Capillary: 157 mg/dL — ABNORMAL HIGH (ref 70–99)
Glucose-Capillary: 81 mg/dL (ref 70–99)
Glucose-Capillary: 51 mg/dL — ABNORMAL LOW (ref 70–99)
Glucose-Capillary: 36 mg/dL — CL (ref 70–99)

## 2023-09-16 LAB — HEMOGLOBIN A1C
Hgb A1c MFr Bld: 8.6 % — ABNORMAL HIGH (ref 4.8–5.6)
Mean Plasma Glucose: 200.12 mg/dL

## 2023-09-16 LAB — PROCALCITONIN: Procalcitonin: 0.69 ng/mL

## 2023-09-16 LAB — TROPONIN I (HIGH SENSITIVITY): Troponin I (High Sensitivity): 5079 ng/L (ref <18)

## 2023-09-16 MED ORDER — DEXTROSE 50 % IV SOLN
25.0000 g | INTRAVENOUS | Status: AC
  Administered 2023-09-16: 25 g via INTRAVENOUS
  Filled 2023-09-16: qty 50

## 2023-09-16 MED ORDER — GENTAMICIN SULFATE 0.1 % EX CREA
1.0000 | TOPICAL_CREAM | Freq: Every day | CUTANEOUS | Status: DC
  Administered 2023-09-19 – 2023-09-25 (×5): 1 via TOPICAL
  Filled 2023-09-16: qty 15

## 2023-09-16 MED ORDER — SODIUM CHLORIDE 0.9 % IV SOLN: 2.0000 g | Freq: Once | INTRAVENOUS | Status: DC

## 2023-09-16 MED ORDER — FLUCONAZOLE IN SODIUM CHLORIDE 200-0.9 MG/100ML-% IV SOLN
200.0000 mg | Freq: Once | INTRAVENOUS | Status: AC
  Administered 2023-09-16: 200 mg via INTRAVENOUS
  Filled 2023-09-16 (×2): qty 100

## 2023-09-16 MED ORDER — DELFLEX-LC/1.5% DEXTROSE 344 MOSM/L IP SOLN: INTRAPERITONEAL | Status: DC

## 2023-09-16 MED ORDER — FLUCONAZOLE 100MG IVPB
100.0000 mg | INTRAVENOUS | Status: DC
  Filled 2023-09-16: qty 50

## 2023-09-16 MED ORDER — SODIUM CHLORIDE 0.9 % IV SOLN
2.0000 g | INTRAVENOUS | Status: DC
  Administered 2023-09-17 – 2023-09-20 (×4): 2 g via INTRAVENOUS
  Filled 2023-09-16 (×4): qty 20

## 2023-09-16 MED ORDER — INSULIN ASPART 100 UNIT/ML IJ SOLN: 0.0000 [IU] | Freq: Every day | INTRAMUSCULAR | Status: DC

## 2023-09-16 MED ORDER — INSULIN GLARGINE-YFGN 100 UNIT/ML ~~LOC~~ SOLN
10.0000 [IU] | Freq: Two times a day (BID) | SUBCUTANEOUS | Status: DC
  Administered 2023-09-16: 10 [IU] via SUBCUTANEOUS
  Filled 2023-09-16 (×4): qty 0.1

## 2023-09-16 MED ORDER — DELFLEX-LC/2.5% DEXTROSE 394 MOSM/L IP SOLN: INTRAPERITONEAL | Status: DC

## 2023-09-16 MED ORDER — FLUCONAZOLE 100MG IVPB
100.0000 mg | INTRAVENOUS | Status: DC
  Administered 2023-09-18 – 2023-09-19 (×2): 100 mg via INTRAVENOUS
  Filled 2023-09-16 (×5): qty 50

## 2023-09-16 MED ORDER — INSULIN ASPART 100 UNIT/ML IJ SOLN
0.0000 [IU] | Freq: Three times a day (TID) | INTRAMUSCULAR | Status: DC
  Administered 2023-09-16: 3 [IU] via SUBCUTANEOUS
  Administered 2023-09-16: 1 [IU] via SUBCUTANEOUS
  Administered 2023-09-17: 2 [IU] via SUBCUTANEOUS
  Administered 2023-09-17: 1 [IU] via SUBCUTANEOUS

## 2023-09-16 MED ORDER — INSULIN ASPART 100 UNIT/ML IJ SOLN
2.0000 [IU] | Freq: Three times a day (TID) | INTRAMUSCULAR | Status: DC
  Administered 2023-09-16 – 2023-09-17 (×4): 2 [IU] via SUBCUTANEOUS

## 2023-09-16 MED ORDER — FOLIC ACID 1 MG PO TABS
0.5000 mg | ORAL_TABLET | Freq: Every day | ORAL | Status: DC
  Administered 2023-09-16 – 2023-09-21 (×6): 0.5 mg via ORAL
  Filled 2023-09-16 (×6): qty 1

## 2023-09-16 NOTE — Progress Notes (Signed)
  Echocardiogram 2D Echocardiogram has been performed.  Koleen KANDICE Popper, RDCS 09/16/2023, 1:44 PM

## 2023-09-16 NOTE — Progress Notes (Signed)
 PD post treatment note  PD treatment not completed as expected. Was unable to drain (NOT ENOUGH DRAIN VOLUME REMOVED DURING THE TREATMENT) Patient tolerated treatment well. PD effluent is clear. specimen collected.  PD exit site clean, dry and intact. Patient is awake, oriented and in no acute distress.  Report given to bedside nurse.   Post treatment VS:   Total UF removed: -1986 ml    09/16/23 0720  Peritoneal Catheter Right lower abdomen  No placement date or time found.   Catheter Location: Right lower abdomen  Site Assessment Clean, Dry, Intact  Drainage Description None  Catheter status Deaccessed  Dressing Gauze/Drain sponge  Dressing Status Clean, Dry, Intact  Dressing Intervention Assessed, no intervention needed  Completion  Treatment Status Complete  Initial Drain Volume 3  Average Dwell Time-Hour(s) 1  Average Dwell Time-Min(s) 30  Average Drain Time 201  Total Therapy Volume 3998  Total Therapy Time-Hour(s) 12  Total Therapy Time-Min(s) 40  Weight after Drain 140 lb 10.5 oz (63.8 kg)  Effluent Appearance Clear;Yellow  Cell Count on Daytime Exchange N/A  Fluid Balance - CCPD  Total UF (- value on cycler, pt gain) -1986 mL  Procedure Comments  Tolerated treatment well? Yes  Peritoneal Dialysis Comments tx not completed as scheduted due to not enogh volune removed.SABRA Burr documentation  Hand-off Given Given to shift RN/LPN  Report given to (Full Name) Jonette, RN  Hand-off Received Received from shift RN/LPN  Report received from (Full Name) Kamron Portee, RN       Post treatment weight:

## 2023-09-16 NOTE — Progress Notes (Signed)
 [facility] PROGRESS NOTE    Progress Note  [name]  FMW:[id number] DOB: 24-Dec-1933 DOA: 09/15/2023 PCP: Corlis Pagan, NP     Brief Narrative:   [name] is an 89 y.o. male past medical history of essential hypertension end-stage renal disease on peritoneal dialysis, baseline dementia CAD status post CABG moderate aortic stenosis chronic diastolic dysfunction presents with confusion, family relates he had a decreased oral intake for the past 4 days.  2 days prior to admission started having nausea vomiting.  He describes the food feels like it stopping and bothers him as he swallows.   Assessment/Plan:   Acute metabolic encephalopathy superimposed with baseline dementia: With nausea vomiting and diarrhea that started 2 days prior to admission, and decreased oral intake 4 days prior to admission. Has remained afebrile with no leukocytosis chest x-ray appears to be clean.  CT of the head showed no acute findings. Nephrology has been consulted and plans to resume peritoneal dialysis. He does have a history of recent peritonitis Peritoneal fluid sent for sampling for culture and cell count.  Check a procalcitonin He does have a history of CAD, twelve-lead EKG showed normal axis nonspecific T wave changes.  Cardiac biomarkers are elevated 7,000 but repeated is now 5700.  GI symptoms nausea and diarrhea: Started on PPI Zofran . He has been complaining of some dysphagia we will start empirically on IV Diflucan . Peritoneal fluid sent for cell count and culture he has remained afebrile with no leukocytosis, check a procalcitonin.  PAD/CAD: With a CABG 37 years ago.  He denies any chest pain or shortness of breath. Left heart cath on December 2024 showed mild to moderate disease, continue aspirin  and statins. Discussed with cardiology as his initial troponins were elevated and they recommended to keep cycling. Patient has known coronary artery disease and no intervention were  done and cath in 2024 and medical management was recommended.  End-stage renal disease on peritoneal dialysis: Nephrology was consulted continue PD.  As per wife he has continue peritoneal dialysis every night.  Did not completed on the night of admission due to confusion. Peritoneal fluid sent for sampling but he has remained afebrile. Check a procalcitonin.  Diabetes mellitus type 2: Blood glucose trending up, will start him on long-acting insulin  add sliding scale insulin  continue CBGs AC and at bedtime.  Chronic diastolic dysfunction: With an EF of 65% normal RV. Holding Lasix  for now.  Essential hypertension Continue Coreg  blood pressure is stable.  DNR (do not resuscitate) Confirmed with wife.   DVT prophylaxis: lovenox Family Communication:none Status is: Observation The patient remains OBS appropriate and will d/c before 2 midnights.    Code Status:     Code Status Orders  (From admission, onward)           Start     Ordered   09/15/23 1556  Do not attempt resuscitation (DNR) Pre-Arrest Interventions Desired  Continuous       Question Answer Comment  If pulseless and not breathing No CPR or chest compressions.   In Pre-Arrest Conditions (Patient Has Pulse and Is Breathing) May intubate, use advanced airway interventions and cardioversion/ACLS medications if appropriate or indicated. May transfer to ICU.   Consent: Discussion documented in EHR or advanced directives reviewed      09/15/23 1612           Code Status History     Date Active Date Inactive Code Status Order ID Comments User Context   03/07/2023 1342 03/10/2023  2128 Do not attempt resuscitation (DNR) PRE-ARREST INTERVENTIONS DESIRED [id number]  Moody Alto, MD ED   02/27/2023 1253 02/27/2023 2156 Full Code [id number]  Anner Alm ORN, MD Inpatient   06/22/2022 2016 07/04/2022 2151 DNR [id number]  Laurence Locus, DO ED   06/22/2022 1948 06/22/2022 2016 DNR [id number]  Laurence Locus, DO ED   04/18/2019 1922  04/25/2019 1924 Full Code [id number]  Claudene Pacific, MD ED   08/08/2017 1717 08/15/2017 1904 Full Code [id number]  Abigail Raphael SAILOR, PA-C Inpatient   06/20/2017 1935 06/21/2017 2037 Full Code [id number]  Kroeger, Bruno HERO., PA-C Inpatient         IV Access:   Peripheral IV   Procedures and diagnostic studies:   CT HEAD WO CONTRAST ( ) Result Date: 09/15/2023 CLINICAL DATA:  89 year old male with altered mental status and decreased appetite for 4 days. Peritoneal dialysis patient. EXAM: CT HEAD WITHOUT CONTRAST TECHNIQUE: Contiguous axial images were obtained from the base of the skull through the vertex without intravenous contrast. RADIATION DOSE REDUCTION: This exam was performed according to the departmental dose-optimization program which includes automated exposure control, adjustment of the mA and/or kV according to patient size and/or use of iterative reconstruction technique. COMPARISON:  Head CT 03/07/2023.  Brain MRI 08/27/2021. FINDINGS: Brain: Stable cerebral volume. No midline shift, ventriculomegaly, mass effect, evidence of mass lesion, intracranial hemorrhage or evidence of cortically based acute infarction. Stable gray-white matter differentiation throughout the brain. Patchy and confluent chronic white matter hypodensity. Vascular: Calcified atherosclerosis at the skull base. No suspicious intracranial vascular hyperdensity. Skull: Stable and intact.  No acute osseous abnormality identified. Sinuses/Orbits: Chronic left maxillary sinusitis. Other Visualized paranasal sinuses and mastoids are stable and well aerated. Other: Calcified scalp vessel atherosclerosis diffusely. No acute orbit soft tissue finding. IMPRESSION: 1. No acute intracranial abnormality. 2. Stable non contrast CT appearance of advanced cerebral white matter disease. 3. Advanced calcified atherosclerosis. Electronically Signed   By: VEAR Hurst M.D.   On: 09/15/2023 12:48   DG Chest Portable 1 View Result Date:  09/15/2023 CLINICAL DATA:  Weakness EXAM: PORTABLE CHEST 1 VIEW COMPARISON:  03/07/2023 FINDINGS: Stable double-lumen right IJ catheter. Status post median sternotomy. Stable cardiopericardial silhouette calcified aorta. Slight prominence of central vasculature. Tiny pleural effusions identified with some mild lung base opacities. No pneumothorax. Overlapping cardiac leads. IMPRESSION: Postop chest with prominent central vasculature.  Right IJ catheter. Tiny pleural effusions with some adjacent opacity, right-greater-than-left. Recommend follow-up. Electronically Signed   By: Ranell Bring M.D.   On: 09/15/2023 12:33     Medical Consultants:   None.   Subjective:    [name] continues to complain of dysphagia.  Objective:    Vitals:   09/15/23 1815 09/15/23 1947 09/16/23 0004 09/16/23 0500  BP: (!) 149/70 139/62 114/62 (!) 156/73  Pulse: 76 72 79 80  Resp:  15 15 16   Temp: 97.8 F (36.6 C) 97.6 F (36.4 C) 97.6 F (36.4 C) 98.7 F (37.1 C)  TempSrc: Oral Oral Oral Oral  SpO2: 98% 100% 100% 100%  Weight:      Height:       SpO2: 100 %   Intake/Output Summary (Last 24 hours) at 09/16/2023 9385 Last data filed at 09/15/2023 2044 Gross per 24 hour  Intake 200 ml  Output --  Net 200 ml   Filed Weights   09/15/23 1135  Weight: 71.7 kg    Exam: General exam: In no acute distress. Respiratory system: Good air movement and clear to auscultation.  Cardiovascular system: S1 & S2 heard, RRR. No JVD.  Gastrointestinal system: Abdomen is nondistended, soft and nontender.  Extremities: No pedal edema. Skin: No rashes, lesions or ulcers Psychiatry: Judgement and insight appear normal. Mood & affect appropriate.    Data Reviewed:    Labs: Basic Metabolic Panel: Recent Labs  Lab 09/15/23 1142  NA 142  K 3.3*  CL 96*  CO2 26  GLUCOSE 256*  BUN 81*  CREATININE 17.05*  CALCIUM  8.2*   GFR Estimated Creatinine Clearance: 3 mL/min (A) (by C-G formula based on SCr  of 17.05 mg/dL (H)). Liver Function Tests: Recent Labs  Lab 09/15/23 1142  AST 24  ALT <5  ALKPHOS 53  BILITOT 0.6  PROT 5.1*  ALBUMIN 1.9*   No results for input(s): LIPASE, AMYLASE in the last 168 hours. No results for input(s): AMMONIA in the last 168 hours. Coagulation profile No results for input(s): INR, PROTIME in the last 168 hours. COVID-19 Labs  No results for input(s): DDIMER, FERRITIN, LDH, CRP in the last 72 hours.  Lab Results  Component Value Date   SARSCOV2NAA NEGATIVE 03/07/2023   SARSCOV2NAA NEGATIVE 04/18/2019    CBC: Recent Labs  Lab 09/15/23 1142  WBC 10.4  NEUTROABS 8.7*  HGB 9.6*  HCT 30.1*  MCV 103.1*  PLT 173   Cardiac Enzymes: No results for input(s): CKTOTAL, CKMB, CKMBINDEX, TROPONINI in the last 168 hours. BNP (last 3 results) No results for input(s): PROBNP in the last 8760 hours. CBG: Recent Labs  Lab 09/15/23 1129 09/15/23 2125  GLUCAP 257* 319*   D-Dimer: No results for input(s): DDIMER in the last 72 hours. Hgb A1c: No results for input(s): HGBA1C in the last 72 hours. Lipid Profile: No results for input(s): CHOL, HDL, LDLCALC, TRIG, CHOLHDL, LDLDIRECT in the last 72 hours. Thyroid  function studies: No results for input(s): TSH, T4TOTAL, T3FREE, THYROIDAB in the last 72 hours.  Invalid input(s): FREET3 Anemia work up: No results for input(s): VITAMINB12, FOLATE, FERRITIN, TIBC, IRON , RETICCTPCT in the last 72 hours. Sepsis Labs: Recent Labs  Lab 09/15/23 1142  WBC 10.4   Microbiology No results found for this or any previous visit (from the past 240 hours).   Medications:    (feeding supplement) PROSource Plus  30 mL Oral QODAY   aspirin  EC  81 mg Oral Daily   atorvastatin   80 mg Oral Daily   carvedilol   3.125 mg Oral BID WC   gentamicin  cream  1 Application Topical Daily   heparin   5,000 Units Subcutaneous Q8H   insulin  aspart  0-6 Units  Subcutaneous TID WC   linaclotide   145 mcg Oral QAC breakfast   pantoprazole   40 mg Oral Daily   sevelamer  carbonate  2,400 mg Oral BID   sodium chloride  flush  3 mL Intravenous Q12H   Continuous Infusions:    LOS: 0 days   [name]  [facility]  09/16/2023, 6:14 AM

## 2023-09-16 NOTE — Progress Notes (Signed)
 [name] Progress Note   Subjective:   Patient seen and examined at bedside.  Less interactive today.  Does answer some questions.  Reports pain but does not specify where. Denies CP, SOB and nausea.  Unable to complete PD last night due to malfunction.  Fluid able to go in but unable to pull back out.    Objective Vitals:   09/15/23 1947 09/16/23 0004 09/16/23 0500 09/16/23 0740  BP: 139/62 114/62 (!) 156/73 138/63  Pulse: 72 79 80 82  Resp: 15 15 16    Temp: 97.6 F (36.4 C) 97.6 F (36.4 C) 98.7 F (37.1 C) 97.8 F (36.6 C)  TempSrc: Oral Oral Oral Oral  SpO2: 100% 100% 100% 100%  Weight:      Height:       Physical Exam General:chronically ill appearing, frail male in NAD Heart:RRR, +3/6 systolic murmur Lungs:CTAB, nml WOB on RA Abdomen:soft, NTND Extremities:no LE edema Dialysis Access: PD cath, [facility]   Filed Weights   09/15/23 1135  Weight: 71.7 kg    Intake/Output Summary (Last 24 hours) at 09/16/2023 1107 Last data filed at 09/16/2023 0720 Gross per 24 hour  Intake -1786 ml  Output --  Net -1786 ml    Additional Objective Labs: Basic Metabolic Panel: Recent Labs  Lab 09/15/23 1142  NA 142  K 3.3*  CL 96*  CO2 26  GLUCOSE 256*  BUN 81*  CREATININE 17.05*  CALCIUM  8.2*   Liver Function Tests: Recent Labs  Lab 09/15/23 1142  AST 24  ALT <5  ALKPHOS 53  BILITOT 0.6  PROT 5.1*  ALBUMIN 1.9*   CBC: Recent Labs  Lab 09/15/23 1142 09/16/23 0637  WBC 10.4 8.3  NEUTROABS 8.7* 6.5  HGB 9.6* 9.8*  HCT 30.1* 30.5*  MCV 103.1* 100.7*  PLT 173 165   CBG: Recent Labs  Lab 09/15/23 1129 09/15/23 2125 09/16/23 0628  GLUCAP 257* 319* 287*    Studies/Results: DG Abd 1 View Result Date: 09/16/2023 CLINICAL DATA:  Peritoneal dialysis catheter dysfunction EXAM: ABDOMEN - 1 VIEW COMPARISON:  06/24/2018 FINDINGS: Peritoneal dialysis catheter is unchanged, coiled over the mid pelvis. Normal abdominal gas pattern. No organomegaly.  Advanced vascular calcifications. Brachytherapy seeds overlie the prostate gland. Advanced degenerative changes noted within the lumbar spine. IMPRESSION: 1. Peritoneal dialysis catheter coiled over the mid pelvis. Electronically Signed   By: Dorethia Molt M.D.   On: 09/16/2023 10:46   CT HEAD WO CONTRAST ( ) Result Date: 09/15/2023 CLINICAL DATA:  89 year old male with altered mental status and decreased appetite for 4 days. Peritoneal dialysis patient. EXAM: CT HEAD WITHOUT CONTRAST TECHNIQUE: Contiguous axial images were obtained from the base of the skull through the vertex without intravenous contrast. RADIATION DOSE REDUCTION: This exam was performed according to the departmental dose-optimization program which includes automated exposure control, adjustment of the mA and/or kV according to patient size and/or use of iterative reconstruction technique. COMPARISON:  Head CT 03/07/2023.  Brain MRI 08/27/2021. FINDINGS: Brain: Stable cerebral volume. No midline shift, ventriculomegaly, mass effect, evidence of mass lesion, intracranial hemorrhage or evidence of cortically based acute infarction. Stable gray-white matter differentiation throughout the brain. Patchy and confluent chronic white matter hypodensity. Vascular: Calcified atherosclerosis at the skull base. No suspicious intracranial vascular hyperdensity. Skull: Stable and intact.  No acute osseous abnormality identified. Sinuses/Orbits: Chronic left maxillary sinusitis. Other Visualized paranasal sinuses and mastoids are stable and well aerated. Other: Calcified scalp vessel atherosclerosis diffusely. No acute orbit soft tissue finding. IMPRESSION: 1.  No acute intracranial abnormality. 2. Stable non contrast CT appearance of advanced cerebral white matter disease. 3. Advanced calcified atherosclerosis. Electronically Signed   By: VEAR Hurst M.D.   On: 09/15/2023 12:48   DG Chest Portable 1 View Result Date: 09/15/2023 CLINICAL DATA:  Weakness  EXAM: PORTABLE CHEST 1 VIEW COMPARISON:  03/07/2023 FINDINGS: Stable double-lumen right IJ catheter. Status post median sternotomy. Stable cardiopericardial silhouette calcified aorta. Slight prominence of central vasculature. Tiny pleural effusions identified with some mild lung base opacities. No pneumothorax. Overlapping cardiac leads. IMPRESSION: Postop chest with prominent central vasculature.  Right IJ catheter. Tiny pleural effusions with some adjacent opacity, right-greater-than-left. Recommend follow-up. Electronically Signed   By: Ranell Bring M.D.   On: 09/15/2023 12:33    Medications:  [START ON 09/17/2023] cefTRIAXone  (ROCEPHIN )  IV     [START ON 09/17/2023] fluconazole  (DIFLUCAN ) IV      (feeding supplement) PROSource Plus  30 mL Oral QODAY   aspirin  EC  81 mg Oral Daily   atorvastatin   80 mg Oral Daily   carvedilol   3.125 mg Oral BID WC   folic acid   0.5 mg Oral Daily   gentamicin  cream  1 Application Topical Daily   heparin   5,000 Units Subcutaneous Q8H   insulin  aspart  0-5 Units Subcutaneous QHS   insulin  aspart  0-6 Units Subcutaneous TID WC   insulin  aspart  2 Units Subcutaneous TID WC   insulin  glargine-yfgn  10 Units Subcutaneous BID   linaclotide   145 mcg Oral QAC breakfast   pantoprazole   40 mg Oral Daily   sevelamer  carbonate  2,400 mg Oral BID   sodium chloride  flush  3 mL Intravenous Q12H    Dialysis Orders: CAPD - 5 dwells, 2L, 1.5hrs edw 64kg    Assessment/Plan:  AMS - baseline dementia.  Recent peritonitis.  Possible re-occurrence - cell count, gram stain and culture collected. CXR with small opacity. Further work up pending.   ESRD -  on PD.  Unable to complete PD last night due to dysfunction, not draining. KUB unremarkable. Will try again tonight.  If not working plan for HD tomorrow using TDC already in place. Labs in AM.  Hypertension/volume  - BP in goal without medications.  Does not appear volume overloaded.  Will use 1/2 1.5% and 1.2 2.5% bags  tonight.  Anemia of CKD - Last Hgb 9.6.  Follow trends. Last mircera dose 5/17.  If Hgb dropping will start again here.   Secondary Hyperparathyroidism -  CCa ok, check phos. Continue home meds  Nutrition - Regular diet with fluid restrictions due to low K.  Manuelita Labella, PA-C [facility] 09/16/2023,11:07 AM  LOS: 0 days

## 2023-09-16 NOTE — TOC Progression Note (Signed)
 Transition of Care [facility]) - Progression Note    Patient Details  Name: ISHMAEL BERKOVICH MRN: [id number] Date of Birth: August 04, 1933  Transition of Care [facility]) CM/SW Contact  Robynn Eileen Hoose, RN Phone Number: 09/16/2023, 2:47 PM  Clinical Narrative:  Attempt made to complete moon letter. Patient unable to understand and requested call be made to wife. Phone call attempt made to  Mrs. [name] Essex at [phone], voicemail left.          Expected Discharge Plan and Services                                               Social Determinants of Health (SDOH) Interventions SDOH Screenings   Food Insecurity: Unknown (09/15/2023)  Housing: Unknown (09/15/2023)  Transportation Needs: No Transportation Needs (03/07/2023)  Utilities: Not At Risk (03/07/2023)  Depression (PHQ2-9): Low Risk  (07/13/2020)  Physical Activity: Sufficiently Active (10/31/2019)  Social Connections: Socially Isolated (03/07/2023)  Stress: No Stress Concern Present (06/29/2023)   Received from [facility]  Tobacco Use: Medium Risk (09/15/2023)    Readmission Risk Interventions    06/27/2022    3:51 PM  Readmission Risk Prevention Plan  Transportation Screening Complete  PCP or Specialist Appt within 5-7 Days Complete  Home Care Screening Complete  Medication Review (RN CM) Referral to Pharmacy

## 2023-09-16 NOTE — Care Management Obs Status (Signed)
 MEDICARE OBSERVATION STATUS NOTIFICATION   Patient Details  Name: [name] MRN: [id number] Date of Birth: Jun 27, 1933   Medicare Observation Status Notification Given:  No Patient confused, voicemail left with wife to return call.   Robynn Eileen Hoose, RN 09/16/2023, 2:58 PM

## 2023-09-17 DIAGNOSIS — R7989 Other specified abnormal findings of blood chemistry: Secondary | ICD-10-CM

## 2023-09-17 DIAGNOSIS — I5020 Unspecified systolic (congestive) heart failure: Secondary | ICD-10-CM

## 2023-09-17 DIAGNOSIS — G9340 Encephalopathy, unspecified: Secondary | ICD-10-CM | POA: Diagnosis not present

## 2023-09-17 DIAGNOSIS — I350 Nonrheumatic aortic (valve) stenosis: Secondary | ICD-10-CM

## 2023-09-17 LAB — COMPREHENSIVE METABOLIC PANEL WITH GFR
ALT: <5 U/L (ref 0–44)
AST: 12 U/L — ABNORMAL LOW (ref 15–41)
Albumin: 1.7 g/dL — ABNORMAL LOW (ref 3.5–5.0)
Alkaline Phosphatase: 49 U/L (ref 38–126)
Anion gap: 19 — ABNORMAL HIGH (ref 5–15)
BUN: 83 mg/dL — ABNORMAL HIGH (ref 8–23)
CO2: 26 mmol/L (ref 22–32)
Calcium: 8 mg/dL — ABNORMAL LOW (ref 8.9–10.3)
Chloride: 95 mmol/L — ABNORMAL LOW (ref 98–111)
Creatinine, Ser: 16.33 mg/dL — ABNORMAL HIGH (ref 0.61–1.24)
GFR, Estimated: 3 mL/min — ABNORMAL LOW (ref >60)
Glucose, Bld: 191 mg/dL — ABNORMAL HIGH (ref 70–99)
Potassium: 3.2 mmol/L — ABNORMAL LOW (ref 3.5–5.1)
Sodium: 140 mmol/L (ref 135–145)
Total Bilirubin: 0.5 mg/dL (ref 0.0–1.2)
Total Protein: 4.9 g/dL — ABNORMAL LOW (ref 6.5–8.1)

## 2023-09-17 LAB — CBC
HCT: 30.4 % — ABNORMAL LOW (ref 39.0–52.0)
Hemoglobin: 9.8 g/dL — ABNORMAL LOW (ref 13.0–17.0)
MCH: 32.5 pg (ref 26.0–34.0)
MCHC: 32.2 g/dL (ref 30.0–36.0)
MCV: 100.7 fL — ABNORMAL HIGH (ref 80.0–100.0)
Platelets: 154 K/uL (ref 150–400)
RBC: 3.02 MIL/uL — ABNORMAL LOW (ref 4.22–5.81)
RDW: 15.1 % (ref 11.5–15.5)
WBC: 9 K/uL (ref 4.0–10.5)
nRBC: 0 % (ref 0.0–0.2)

## 2023-09-17 LAB — BODY FLUID CELL COUNT WITH DIFFERENTIAL: Total Nucleated Cell Count, Fluid: 5 uL (ref 0–1000)

## 2023-09-17 LAB — GLUCOSE, CAPILLARY
Glucose-Capillary: 107 mg/dL — ABNORMAL HIGH (ref 70–99)
Glucose-Capillary: 113 mg/dL — ABNORMAL HIGH (ref 70–99)
Glucose-Capillary: 157 mg/dL — ABNORMAL HIGH (ref 70–99)
Glucose-Capillary: 196 mg/dL — ABNORMAL HIGH (ref 70–99)
Glucose-Capillary: 237 mg/dL — ABNORMAL HIGH (ref 70–99)

## 2023-09-17 LAB — HEPATITIS B SURFACE ANTIGEN: Hepatitis B Surface Ag: NONREACTIVE

## 2023-09-17 MED ORDER — CHLORHEXIDINE GLUCONATE CLOTH 2 % EX PADS
6.0000 | MEDICATED_PAD | Freq: Every day | CUTANEOUS | Status: DC
  Administered 2023-09-18 – 2023-09-19 (×2): 6 via TOPICAL

## 2023-09-17 MED ORDER — INSULIN GLARGINE-YFGN 100 UNIT/ML ~~LOC~~ SOLN
15.0000 [IU] | Freq: Two times a day (BID) | SUBCUTANEOUS | Status: DC
  Filled 2023-09-17 (×2): qty 0.15

## 2023-09-17 MED ORDER — INSULIN GLARGINE-YFGN 100 UNIT/ML ~~LOC~~ SOLN
8.0000 [IU] | Freq: Every day | SUBCUTANEOUS | Status: DC
  Administered 2023-09-17: 8 [IU] via SUBCUTANEOUS
  Filled 2023-09-17 (×2): qty 0.08

## 2023-09-17 MED ORDER — INSULIN GLARGINE-YFGN 100 UNIT/ML ~~LOC~~ SOLN: 8.0000 [IU] | Freq: Two times a day (BID) | SUBCUTANEOUS | Status: DC

## 2023-09-17 NOTE — Progress Notes (Addendum)
 [facility] Progress Note   Subjective:    Seen and examined patient at bedside. Discussed with HD RN. PD machine alarming overnight and draining remains slow. PD treatment still not done. Noted KUB from 7/20 unremarkable. Patient remains on RA and denies SOB, ABD pain, and N/V. He reports making BMs but don't see any stools documented. Discussed with patient's primary Nephrologist. Plan to transition him back to IHD tomorrow morning. Patient still has his R TDC. Plan to also consult [facility]VS to remove PD catheter.  Objective Vitals:   09/16/23 1700 09/17/23 0500 09/17/23 0700 09/17/23 0756  BP: 132/79  136/71 137/68  Pulse: 75  80 82  Resp: 18   17  Temp: 98.3 F (36.8 C)  97.6 F (36.4 C) (!) 97.4 F (36.3 C)  TempSrc: Axillary  Oral Oral  SpO2: 95%  100% 100%  Weight:  57.4 kg    Height:       Physical Exam General:chronically ill appearing, frail male in NAD Heart: RRR, +3/6 systolic murmur Lungs:CTAB, nml WOB on RA Abdomen:soft, NTND Extremities:no LE edema Dialysis Access: PD cath, [facility]   Filed Weights   09/15/23 1135 09/17/23 0500  Weight: 71.7 kg 57.4 kg    Intake/Output Summary (Last 24 hours) at 09/17/2023 1218 Last data filed at 09/16/2023 1839 Gross per 24 hour  Intake 720 ml  Output --  Net 720 ml    Additional Objective Labs: Basic Metabolic Panel: Recent Labs  Lab 09/15/23 1142 09/17/23 0632  NA 142 140  K 3.3* 3.2*  CL 96* 95*  CO2 26 26  GLUCOSE 256* 191*  BUN 81* 83*  CREATININE 17.05* 16.33*  CALCIUM  8.2* 8.0*   Liver Function Tests: Recent Labs  Lab 09/15/23 1142 09/17/23 0632  AST 24 12*  ALT <5 <5  ALKPHOS 53 49  BILITOT 0.6 0.5  PROT 5.1* 4.9*  ALBUMIN 1.9* 1.7*   No results for input(s): LIPASE, AMYLASE in the last 168 hours. CBC: Recent Labs  Lab 09/15/23 1142 09/16/23 0637 09/17/23 0632  WBC 10.4 8.3 9.0  NEUTROABS 8.7* 6.5  --   HGB 9.6* 9.8* 9.8*  HCT 30.1* 30.5* 30.4*  MCV 103.1* 100.7* 100.7*   PLT 173 165 154   Blood Culture    Component Value Date/Time   SDES BLOOD RIGHT HAND 03/07/2023 0636   SPECREQUEST  03/07/2023 0636    BOTTLES DRAWN AEROBIC AND ANAEROBIC Blood Culture results may not be optimal due to an inadequate volume of blood received in culture bottles   CULT  03/07/2023 0636    NO GROWTH 5 DAYS Performed at [facility], [address], [place], [place] [address]    REPTSTATUS 03/12/2023 FINAL 03/07/2023 0636    Cardiac Enzymes: No results for input(s): CKTOTAL, CKMB, CKMBINDEX, TROPONINI in the last 168 hours. CBG: Recent Labs  Lab 09/16/23 2136 09/16/23 2306 09/17/23 0008 09/17/23 0514 09/17/23 1128  GLUCAP 51* 36* 107* 157* 237*   Iron  Studies: No results for input(s): IRON , TIBC, TRANSFERRIN, FERRITIN in the last 72 hours. Lab Results  Component Value Date   INR 1.2 03/08/2023   INR 1.2 03/07/2023   INR 1.1 04/19/2019   Studies/Results: ECHOCARDIOGRAM COMPLETE Result Date: 09/16/2023    ECHOCARDIOGRAM REPORT   Patient Name:   [name] Date of Exam: 09/16/2023 Medical Rec #:  [id number]       Height:       71.0 in Accession #:    [id number]  Weight:       158.1 lb Date of Birth:  [birth year]        BSA:          1.908 m Patient Age:    89 years        BP:           138/63 mmHg Patient Gender: M               HR:           74 bpm. Exam Location:  Inpatient Procedure: 2D Echo, 3D Echo, Cardiac Doppler and Color Doppler (Both Spectral            and Color Flow Doppler were utilized during procedure). Indications:    Elevated Troponin  History:        Patient has prior history of Echocardiogram examinations, most                 recent 10/06/2022. CHF, CAD, Prior CABG, PAD; Risk                 Factors:Hypertension, Diabetes, Dyslipidemia and Former Smoker.  Sonographer:    Koleen Popper RDCS Referring Phys: [id number] SUBRINA SUNDIL IMPRESSIONS  1. Left ventricular ejection fraction, by estimation, is 40 to 45%. The left  ventricle has mildly decreased function. The left ventricle demonstrates regional wall motion abnormalities (see scoring diagram/findings for description). Left ventricular diastolic parameters are consistent with Grade II diastolic dysfunction (pseudonormalization). Elevated left atrial pressure. There is severe hypokinesis of the left ventricular, entire inferolateral wall. There is mild hypokinesis of the left ventricular, basal-mid lateral wall and inferior wall.  2. Right ventricular systolic function is mildly reduced. The right ventricular size is normal. There is normal pulmonary artery systolic pressure. The estimated right ventricular systolic pressure is 20.1 mmHg.  3. Left atrial size was moderately dilated.  4. The mitral valve is degenerative. Mild to moderate mitral valve regurgitation. No evidence of mitral stenosis. Moderate mitral annular calcification.  5. The aortic valve is tricuspid. There is severe calcifcation of the aortic valve. There is severe thickening of the aortic valve. Aortic valve regurgitation is trivial. Severe aortic valve stenosis. Aortic valve mean gradient measures 31.0 mmHg. Aortic valve Vmax measures 3.68 m/s. Comparison(s): The left ventricular function is worsened. The left ventricular wall motion abnormalities are new. Aortic stenosis is worse and meets criteria for low flow low gradient severe aortic stenosis. FINDINGS  Left Ventricle: Left ventricular ejection fraction, by estimation, is 40 to 45%. The left ventricle has mildly decreased function. The left ventricle demonstrates regional wall motion abnormalities. Severe hypokinesis of the left ventricular, entire inferolateral wall. Mild hypokinesis of the left ventricular, basal-mid lateral wall and inferior wall. 3D ejection fraction reviewed and evaluated as part of the interpretation. Alternate measurement of EF is felt to be most reflective of LV function. The left ventricular internal cavity size was normal in  size. There is no left ventricular hypertrophy. Abnormal (paradoxical) septal motion consistent with post-operative status. Left ventricular diastolic parameters are consistent with Grade II diastolic dysfunction (pseudonormalization). Elevated left atrial pressure.  LV Wall Scoring: The antero-lateral wall, posterior wall, and basal inferior segment are hypokinetic. The entire anterior wall, entire septum, entire apex, and mid and distal inferior wall are normal. Right Ventricle: The right ventricular size is normal. No increase in right ventricular wall thickness. Right ventricular systolic function is mildly reduced. There is normal pulmonary artery systolic pressure. The tricuspid regurgitant velocity is 2.07 m/s,  and with an assumed right atrial pressure of 3 mmHg, the estimated right ventricular systolic pressure is 20.1 mmHg. Left Atrium: Left atrial size was moderately dilated. Right Atrium: Right atrial size was normal in size. Pericardium: There is no evidence of pericardial effusion. Mitral Valve: The mitral valve is degenerative in appearance. Moderate mitral annular calcification. Mild to moderate mitral valve regurgitation, with centrally-directed jet. No evidence of mitral valve stenosis. Tricuspid Valve: The tricuspid valve is normal in structure. Tricuspid valve regurgitation is trivial. Aortic Valve: The aortic valve is tricuspid. There is severe calcifcation of the aortic valve. There is severe thickening of the aortic valve. Aortic valve regurgitation is trivial. Severe aortic stenosis is present. Aortic valve mean gradient measures 31.0 mmHg. Aortic valve peak gradient measures 54.0 mmHg. Aortic valve area, by VTI measures 0.73 cm. Pulmonic Valve: The pulmonic valve was not well visualized. Pulmonic valve regurgitation is not visualized. No evidence of pulmonic stenosis. Aorta: The aortic root is normal in size and structure. IAS/Shunts: No atrial level shunt detected by color flow Doppler.  Additional Comments: 3D was performed not requiring image post processing on an independent workstation and was abnormal.  LEFT VENTRICLE PLAX 2D LVIDd:         4.70 cm   Diastology LVIDs:         4.40 cm   LV e' medial:    3.65 cm/s LV PW:         1.10 cm   LV E/e' medial:  27.9 LV IVS:        1.10 cm   LV e' lateral:   6.00 cm/s LVOT diam:     1.94 cm   LV E/e' lateral: 17.0 LV SV:         57 LV SV Index:   30 LVOT Area:     2.96 cm                           3D Volume EF:                          3D EF:        32 %                          LV EDV:       136 ml                          LV ESV:       93 ml                          LV SV:        43 ml RIGHT VENTRICLE            IVC RV Basal diam:  3.80 cm    IVC diam: 1.30 cm RV S prime:     8.60 cm/s TAPSE (M-mode): 2.0 cm LEFT ATRIUM             Index        RIGHT ATRIUM           Index LA diam:        4.80 cm 2.52 cm/m   RA Area:     12.70 cm LA Vol (A2C):   50.8 ml 26.63 ml/m  RA Volume:  30.10 ml  15.78 ml/m LA Vol (A4C):   54.9 ml 28.77 ml/m LA Biplane Vol: 55.1 ml 28.88 ml/m  AORTIC VALVE AV Area (Vmax):    0.72 cm AV Area (Vmean):   0.66 cm AV Area (VTI):     0.73 cm AV Vmax:           367.50 cm/s AV Vmean:          260.500 cm/s AV VTI:            0.783 m AV Peak Grad:      54.0 mmHg AV Mean Grad:      31.0 mmHg LVOT Vmax:         89.10 cm/s LVOT Vmean:        58.200 cm/s LVOT VTI:          0.194 m LVOT/AV VTI ratio: 0.25  AORTA Ao Root diam: 3.40 cm Ao Asc diam:  2.90 cm MITRAL VALVE                TRICUSPID VALVE MV Area (PHT): 3.31 cm     TR Peak grad:   17.1 mmHg MV Decel Time: 229 msec     TR Vmax:        207.00 cm/s MV E velocity: 102.00 cm/s MV A velocity: 134.00 cm/s  SHUNTS MV E/A ratio:  0.76         Systemic VTI:  0.19 m                             Systemic Diam: 1.94 cm Jerel Croitoru MD Electronically signed by Jerel Balding MD Signature Date/Time: 09/16/2023/2:05:05 PM    Final    DG Abd 1 View Result Date: 09/16/2023 CLINICAL  DATA:  Peritoneal dialysis catheter dysfunction EXAM: ABDOMEN - 1 VIEW COMPARISON:  06/24/2018 FINDINGS: Peritoneal dialysis catheter is unchanged, coiled over the mid pelvis. Normal abdominal gas pattern. No organomegaly. Advanced vascular calcifications. Brachytherapy seeds overlie the prostate gland. Advanced degenerative changes noted within the lumbar spine. IMPRESSION: 1. Peritoneal dialysis catheter coiled over the mid pelvis. Electronically Signed   By: Dorethia Molt M.D.   On: 09/16/2023 10:46   CT HEAD WO CONTRAST ( ) Result Date: 09/15/2023 CLINICAL DATA:  89 year old male with altered mental status and decreased appetite for 4 days. Peritoneal dialysis patient. EXAM: CT HEAD WITHOUT CONTRAST TECHNIQUE: Contiguous axial images were obtained from the base of the skull through the vertex without intravenous contrast. RADIATION DOSE REDUCTION: This exam was performed according to the departmental dose-optimization program which includes automated exposure control, adjustment of the mA and/or kV according to patient size and/or use of iterative reconstruction technique. COMPARISON:  Head CT 03/07/2023.  Brain MRI 08/27/2021. FINDINGS: Brain: Stable cerebral volume. No midline shift, ventriculomegaly, mass effect, evidence of mass lesion, intracranial hemorrhage or evidence of cortically based acute infarction. Stable gray-white matter differentiation throughout the brain. Patchy and confluent chronic white matter hypodensity. Vascular: Calcified atherosclerosis at the skull base. No suspicious intracranial vascular hyperdensity. Skull: Stable and intact.  No acute osseous abnormality identified. Sinuses/Orbits: Chronic left maxillary sinusitis. Other Visualized paranasal sinuses and mastoids are stable and well aerated. Other: Calcified scalp vessel atherosclerosis diffusely. No acute orbit soft tissue finding. IMPRESSION: 1. No acute intracranial abnormality. 2. Stable non contrast CT appearance of  advanced cerebral white matter disease. 3. Advanced calcified atherosclerosis. Electronically Signed   By: VEAR Hurst M.D.   On: 09/15/2023 12:48  Medications:  cefTRIAXone  (ROCEPHIN )  IV 2 g (09/17/23 0551)   dialysis solution 1.5% low-MG/low-CA     dialysis solution 2.5% low-MG/low-CA     fluconazole  (DIFLUCAN ) IV      (feeding supplement) PROSource Plus  30 mL Oral QODAY   aspirin  EC  81 mg Oral Daily   atorvastatin   80 mg Oral Daily   carvedilol   3.125 mg Oral BID WC   folic acid   0.5 mg Oral Daily   gentamicin  cream  1 Application Topical Daily   heparin   5,000 Units Subcutaneous Q8H   insulin  aspart  0-5 Units Subcutaneous QHS   insulin  aspart  0-6 Units Subcutaneous TID WC   insulin  aspart  2 Units Subcutaneous TID WC   insulin  glargine-yfgn  8 Units Subcutaneous Daily   linaclotide   145 mcg Oral QAC breakfast   pantoprazole   40 mg Oral Daily   sevelamer  carbonate  2,400 mg Oral BID   sodium chloride  flush  3 mL Intravenous Q12H    Dialysis Orders: CAPD - 5 dwells, 2L, 1.5hrs EDW 64kg   Assessment/Plan: AMS - baseline dementia.  Recent peritonitis.  Possible re-occurrence - cell count collected yesterday but it appears gram stain and culture stills needs to get done-will collect today. CXR with small opacity. Further work up pending.  ESRD -  PD catheter is still malfunctioning. Ongoing alarming overnight and continues to drain slowly. KUB unremarkable. Discussed with patient's primary Nephrologist. Plan to transition back to IHD tomorrow morning. Also will consult [facility] for PD catheter removal. Discussed plan with patient's wife by phone today. Obtaining labs in AM. Hypertension/volume  - BP in goal without medications.  Does not appear volume overloaded.  using 1/2 1.5% and 1.2 2.5% bags now, treatment still in progress d/t slow draining. Anemia of CKD - Last Hgb 9.6.  Follow trends. Last mircera dose 5/17.  If Hgb dropping will start again here.  Secondary  Hyperparathyroidism -  CCa ok, check phos. Continue home meds Nutrition - Regular diet with fluid restrictions due to low K.   Charmaine Piety, NP [facility] 09/17/2023,12:18 PM  LOS: 1 day

## 2023-09-17 NOTE — Consult Note (Signed)
 Cardiology Consultation   Patient ID: [name] MRN: [id number]; DOB: [birth year]  Admit date: 09/15/2023 Date of Consult: 09/17/2023  PCP:  Nathaniel Pagan, NP   [facility] Providers Cardiologist:  Nathaniel JAYSON Maxcy, MD     Patient Profile: [name] is a 89 y.o. male with a hx of ESRD on HD, HTN, HLD, PAD, CAD s/p CABG in 1987, moderate AS, chronic diastolic heart failure who is being seen 09/17/2023 for the evaluation of new wall motion abnormality on echo at the request of Dr. Odell [facility].  History of Present Illness: Mr. [name] is an 89 year old male with above medical history who is followed by Dr. Maxcy.   Patient has a very long history of CAD.  Previously underwent CABG in 1987 with LIMA-LAD, sequential SVG-diagonal and OM.  Later, cardiac catheterization 05/2017 showed native vessel disease with 3/3 patent grafts. He has mild PAD which is followed by vascular surgery. Had carotid dopplers in 09/2021 that showed near-normal ICAs bilaterally. Has ESRD and is followed by nephrology, on PD.   Later echocardiogram in 09/2022 showed EF 60-65%, no regional wall motion abnormalities, normal RV systolic function, moderate AS. In 12/2022, patient reported shortness of breath and fatigue. Underwent cardiac PET 02/14/23 that was abnormal and detected a large defect in the mid to basal lateral locations, there was abnormal wall motion in the defect area. Overall consistent with ischemia.   Due to abnormal PET, patient underwent LHC on 02/27/23 that showed progression of native LCA with now 100% CTO of the left main and diffuse mild-moderate disease in heavily calcified RCA. LIMA-LAD was patent. Seq SVG-Diag-OM had ostial, mid, and distal lesions with CTA of sequential limb to OM. Diagonal appeared to have adequate flow. Given patient's advanced age, lack of severe anginal symptoms, and 88 year old grafts, recommended medical therapy for CAD.   He was last seen by cardiology in  clinic on 05/02/23. At that time, patient denied chest pain. Remained on ASA, carvedilol , imdur , hydralazine , lipitor .   Patient presented to the ED on 7/19. Per patient's wife, he had AMS and decreased appetite since Tuesday. Had nausea, vomiting, and diarrhea for the past 2 days. CMP significant for K 3.3, glucose 256, creatinine 17.05, calcium  8.2. CBC with WBC10.4, hemoglobin 9.6. hsTn [phone]. CXR showed tiny pleural effusions with some adjacent opacity.   He was admitted to the internal medicine service for treatment of AMS. Nephrology consulted and was concerned for recurrent peritonitis. Given patient's lack of chest pain and downtrending troponin, he was not started on IV heparin .   Echocardiogram 7/20 showed EF 40-45% with regional wall motion abnormalities, grade II DD, mildly reduced RV systolic function, normal PA systolic pressure, mild-moderate MR, severe AS. Cardiology was consulted for abnormal echocardiogram and elevated troponin.   On interview today, patient is oriented to person. Reports that the month is March and the year is 2023. Thinks we are in [name], does not know that he is in the [facility]. Unable to tell my why he is in a [facility]. Denies chest pain or shortness of breath   Past Medical History:  Diagnosis Date   Acute coronary syndrome (HCC) 04/18/2019   AMS (altered mental status) 03/07/2023   Aortic stenosis 02/27/2020   mild to moderate AS   Arthritis    a little in LLE (06/20/2017)   Ataxia    Chronic diastolic CHF (congestive heart failure) (HCC)    Coronary artery disease    a. s/p  CABG 1980s. b. stable by cath 05/2017.   Dizziness    Dyslipidemia    Edema 08/08/2017   HANDS & LOWER EXTREMITES   ESRD on hemodialysis (HCC)    Hypertension    Hypoalbuminemia    Nephrotic range proteinuria    Peripheral artery disease (HCC)    mild   Pneumonia ~ 1950 X 1   Prostate cancer (HCC) ~ 2015   Type II diabetes mellitus (HCC)     Past Surgical  History:  Procedure Laterality Date   CAPD REVISION N/A 06/29/2022   Procedure: REPLACEMENT OF PERITONEAL DIALYSIS CATHETER;  Surgeon: Magda Debby SAILOR, MD;  Location: [facility] OR;  Service: Vascular;  Laterality: N/A;   CARDIAC CATHETERIZATION     a couple times (06/20/2017)   CATARACT EXTRACTION W/ INTRAOCULAR LENS  IMPLANT, BILATERAL Bilateral    COLONOSCOPY     CORONARY ARTERY BYPASS GRAFT  03/06/1985   LAD and first diagonal/circumflex (sequential saphenous vein graft,cardioplegia   INGUINAL HERNIA REPAIR Right 12/06/2020   Procedure: OPEN RIGHT INGUINAL HERNIA REPAIR;  Surgeon: Gladis Cough, MD;  Location: [facility];  Service: General;  Laterality: Right;   INSERTION PROSTATE RADIATION SEED  ~ 2015   IR FLUORO GUIDE CV LINE RIGHT  06/26/2022   IR US  GUIDE VASC ACCESS RIGHT  06/26/2022   LAPAROSCOPIC LYSIS OF ADHESIONS N/A 06/29/2022   Procedure: LAPAROSCOPIC LYSIS OF ADHESIONS;  Surgeon: Magda Debby SAILOR, MD;  Location: [facility] OR;  Service: Vascular;  Laterality: N/A;   LAPAROSCOPY N/A 06/29/2022   Procedure: LAPAROSCOPY DIAGNOSTIC;  Surgeon: Magda Debby SAILOR, MD;  Location: [facility] OR;  Service: Vascular;  Laterality: N/A;   LEFT HEART CATH AND CORS/GRAFTS ANGIOGRAPHY N/A 02/27/2023   Procedure: LEFT HEART CATH AND CORS/GRAFTS ANGIOGRAPHY;  Surgeon: Anner Alm ORN, MD;  Location: [facility];  Service: Cardiovascular;  Laterality: N/A;   NM MYOCAR PERF WALL MOTION  09/12/2006   no ischemia   REMOVAL OF A DIALYSIS CATHETER N/A 07/04/2022   Procedure: PERITONEAL CATHETER REMOVAL;  Surgeon: Lanis Fonda BRAVO, MD;  Location: [facility];  Service: Vascular;  Laterality: N/A;   RIGHT/LEFT HEART CATH AND CORONARY/GRAFT ANGIOGRAPHY N/A 06/21/2017   Procedure: RIGHT/LEFT HEART CATH AND CORONARY/GRAFT ANGIOGRAPHY;  Surgeon: Verlin Lonni BIRCH, MD;  Location: [facility];  Service: Cardiovascular;  Laterality: N/A;   US  ECHOCARDIOGRAPHY  11/04/2008   trace TR & MR    Scheduled Meds:  (feeding  supplement) PROSource Plus  30 mL Oral QODAY   aspirin  EC  81 mg Oral Daily   atorvastatin   80 mg Oral Daily   carvedilol   3.125 mg Oral BID WC   folic acid   0.5 mg Oral Daily   gentamicin  cream  1 Application Topical Daily   heparin   5,000 Units Subcutaneous Q8H   insulin  aspart  0-5 Units Subcutaneous QHS   insulin  aspart  0-6 Units Subcutaneous TID WC   insulin  aspart  2 Units Subcutaneous TID WC   insulin  glargine-yfgn  15 Units Subcutaneous BID   linaclotide   145 mcg Oral QAC breakfast   pantoprazole   40 mg Oral Daily   sevelamer  carbonate  2,400 mg Oral BID   sodium chloride  flush  3 mL Intravenous Q12H   Continuous Infusions:  cefTRIAXone  (ROCEPHIN )  IV 2 g (09/17/23 0551)   dialysis solution 1.5% low-MG/low-CA     dialysis solution 2.5% low-MG/low-CA     fluconazole  (DIFLUCAN ) IV     PRN Meds: acetaminophen  **OR** acetaminophen , famotidine , melatonin, polyethylene  glycol  Allergies:    Allergies  Allergen Reactions   Clonidine  Hcl     Other reaction(s): constipation    Social History:   Social History   Socioeconomic History   Marital status: Married    Spouse name: [name]   Number of children: Not on file   Years of education: Not on file   Highest education level: Not on file  Occupational History   Occupation: Retired  Tobacco Use   Smoking status: Former    Current packs/day: 0.00    Average packs/day: 1 pack/day for 20.0 years (20.0 ttl pk-yrs)    Types: Cigarettes    Start date: 08/22/1952    Quit date: 08/22/1972    Years since quitting: 51.1   Smokeless tobacco: Never  Vaping Use   Vaping status: Never Used  Substance and Sexual Activity   Alcohol  use: Not Currently    Comment: 06/20/2017 nothing since ~ 1974   Drug use: No   Sexual activity: Not Currently  Other Topics Concern   Not on file  Social History Narrative   Lives with wife   Right handed   Caffeine- 1 cup some days    Social Drivers of Corporate investment banker Strain:  Not on file  Food Insecurity: Unknown (09/15/2023)   Hunger Vital Sign    Worried About Running Out of Food in the Last Year: Never true    Ran Out of Food in the Last Year: Not on file  Transportation Needs: No Transportation Needs (03/07/2023)   PRAPARE - Administrator, Civil Service (Medical): No    Lack of Transportation (Non-Medical): No  Physical Activity: Sufficiently Active (10/31/2019)   Exercise Vital Sign    Days of Exercise per Week: 7 days    Minutes of Exercise per Session: 40 min  Stress: No Stress Concern Present (06/29/2023)   Received from [facility] of Occupational Health - Occupational Stress Questionnaire    Feeling of Stress : Not at all  Social Connections: Socially Isolated (03/07/2023)   Social Connection and Isolation Panel    Frequency of Communication with Friends and Family: Once a week    Frequency of Social Gatherings with Friends and Family: Once a week    Attends Religious Services: Never    Database administrator or Organizations: No    Attends Engineer, structural: Not on file    Marital Status: Married  Catering manager Violence: Not At Risk (03/07/2023)   Humiliation, Afraid, Rape, and Kick questionnaire    Fear of Current or Ex-Partner: No    Emotionally Abused: No    Physically Abused: No    Sexually Abused: No    Family History:    Family History  Problem Relation Age of Onset   Heart attack Mother      ROS:  Please see the history of present illness.   All other ROS reviewed and negative.     Physical Exam/Data: Vitals:   09/16/23 1700 09/17/23 0500 09/17/23 0700 09/17/23 0756  BP: 132/79  136/71 137/68  Pulse: 75  80 82  Resp: 18   17  Temp: 98.3 F (36.8 C)  97.6 F (36.4 C) (!) 97.4 F (36.3 C)  TempSrc: Axillary  Oral Oral  SpO2: 95%  100% 100%  Weight:  57.4 kg    Height:        Intake/Output Summary (Last 24 hours) at 09/17/2023 1056 Last data filed at  09/16/2023 1839 Gross per  24 hour  Intake 720 ml  Output --  Net 720 ml      09/17/2023    5:00 AM 09/15/2023   11:35 AM 08/22/2023    2:14 PM  Last 3 Weights  Weight (lbs) 126 lb 8.7 oz 158 lb 1.1 oz 158 lb  Weight (kg) 57.4 kg 71.7 kg 71.668 kg     Body mass index is 17.65 kg/m.  General:  Thin, frail elderly male. Laying flat in the bed  HEENT: normal Neck: no JVD Vascular: Radial pulses 2+ bilaterally Cardiac:  normal S1, S2; RRR; grade 3/6 systolic murmur  Lungs:  lungs clear. Normal WOB on room air  Abd: soft, nontender  Ext: no edema in BLE  Musculoskeletal:  No deformities  Skin: warm and dry  Neuro:   Oriented to person only  Psych:  Normal affect   EKG:  The EKG was personally reviewed and demonstrates:  NSR, nonspecific ST abnormality  Telemetry:  Telemetry was personally reviewed and demonstrates:  NSR with occasional PVCs   Relevant CV Studies: Cardiac Studies & Procedures   ______________________________________________________________________________________________ CARDIAC CATHETERIZATION  CARDIAC CATHETERIZATION 02/27/2023  Conclusion   Mid LM to Prox LAD lesion is 100% stenosed.  Ost Cx to Prox Cx lesion is 100% stenosed.   Diffuse mild to moderate disease and heavily calcified RCA: Prox RCA to Mid RCA lesion is 30% stenosed.  Mid RCA lesion is 40% stenosed. Dist RCA lesion is 30% stenosed.   ----------------GRAFTS------------------------   LIMA-midLAD graft was visualized by angiography and is large.  The graft exhibits no disease.   Seq SVG- Seq SVG-Diag-OM graft was visualized by angiography: The graft exhibits severe focal disease with CTO of sequential Limb from Diag-OM.   ** Origin lesion before 1st Diag  is 60% stenosed.  Prox Graft to Mid Graft lesion before 1st Diag is 60% stenosed.  Dist Graft lesion before 1st Diag is 60% stenosed.   ** Prox Graft to Dist Graft lesion between 1st Diag and 1st Mrg is 100% stenosed.   LV end diastolic pressure is mildly elevated.   There  is mild aortic valve stenosis.  POST-CATH FINDINGS Progression of Native LCA with now 100% CTO of LM & diffuse mild-moderate disease in a heavily calcified RCA. Widely patent LIMA-LAD Sequential Ostial, mid & distal SeqSVG-Diag-OM ~60% lesions with CTO of seqential Limb to OM. Tiny Caliber Diag appears to have adequate flow from the existing 89 y/o graft. Mildly elevated LVEDP of 15 mmHg despite systemic hypertension with pressures in the 180s. Mild calcified aortic stenosis with gradient of roughly 15 mmHg. Heavily calcified Common Femoral Artery   RECOMMENDATIONS Given advanced age, lack of severe anginal symptoms, and 88 year old graft, I felt the best course of action would be to titrate medical therapy for existing CAD.  Not likely to be beneficial stenting and extensive area and 89 year old graft for a very small caliber diagonal that has an occluded distal branch. He will be discharged home after sheath removal and adequate bedrest.   Alm Clay, MD  Findings Coronary Findings Diagnostic  Dominance: Right  Left Main Mid LM to Prox LAD lesion is 100% stenosed. The lesion is chronically occluded. Progression from LAD to to the mid left Main.  Left Anterior Descending Vessel is large.  First Diagonal Branch Vessel is moderate in size.  Left Circumflex Ost Cx to Prox Cx lesion is 100% stenosed.  First Obtuse Marginal Branch Vessel is large in size.  Right Coronary  Artery Vessel was injected. Vessel is large. There is moderate diffuse disease throughout the vessel. The vessel is severely calcified. Prox RCA to Mid RCA lesion is 30% stenosed. The lesion is calcified. Mid RCA lesion is 40% stenosed. The lesion is calcified. Dist RCA lesion is 30% stenosed. The lesion is calcified.  LIMA LIMA Graft To Mid LAD LIMA graft was visualized by angiography and is large.  LIMA-midLAD The graft exhibits no disease.  Sequential Jump Graft Graft To 1st Diag, 1st Mrg Seq SVG-  Seq SVG-Diag-OM graft was visualized by angiography.  The graft exhibits severe focal disease. Origin lesion before 1st Diag  is 60% stenosed. Prox Graft to Mid Graft lesion before 1st Diag  is 60% stenosed. Dist Graft lesion before 1st Diag  is 60% stenosed. Prox Graft to Dist Graft lesion between 1st Diag and 1st Mrg  is 100% stenosed.  Intervention  No interventions have been documented.   CARDIAC CATHETERIZATION  CARDIAC CATHETERIZATION 06/21/2017  Conclusion  Prox RCA to Mid RCA lesion is 30% stenosed.  Mid RCA lesion is 40% stenosed.  Dist RCA lesion is 30% stenosed.  Ost LAD to Prox LAD lesion is 100% stenosed.  LIMA graft was visualized by angiography and is normal in caliber.  Ost Cx to Prox Cx lesion is 90% stenosed.  Hemodynamic findings consistent with mild pulmonary hypertension.  LV end diastolic pressure is normal.  1. Severe double vessel CAD s/p 3V CABG with 3/3 patent bypass grafts 2. The LAD has 100% proximal occlusion. The entire LAD fills from the patent LIMA graft. The Diagonal branch fills from the vein graft 3. The Circumflex has 90% proximal stenosis. The vein graft fills the large obtuse marginal graft. 4. The RCA is a large dominant vessel with mild diffuse calcific plaque in the proximal, mid and distal vessel but no obstructive lesions. 5. The LIMA to the LAD is patent 6 The sequential saphenous vein graft to the Diagonal and OM is patent with mild filling defects seen in the proximal, mid and distal segments of the body of the graft. These lesions do not appear to be flow limiting. 7. Mild aortic stenosis. Peak to peak gradient 4 mmHg. The valve was easily crossed. 8. Right and left heart filling pressures outline above.  Recommendations: Continue medical management of CAD. Continue aggressive blood pressure control and diuresis for diastolic CHF. Will hydrate for 6 hours post cath and plan discharge home later today.  Findings Coronary  Findings Diagnostic  Dominance: Right  Left Anterior Descending Vessel is large. Ost LAD to Prox LAD lesion is 100% stenosed. The lesion is chronically occluded.  First Diagonal Branch Vessel is moderate in size.  Left Circumflex Ost Cx to Prox Cx lesion is 90% stenosed.  First Obtuse Marginal Branch Vessel is large in size.  Right Coronary Artery Vessel is large. Prox RCA to Mid RCA lesion is 30% stenosed. The lesion is calcified. Mid RCA lesion is 40% stenosed. The lesion is calcified. Dist RCA lesion is 30% stenosed. The lesion is calcified.  LIMA LIMA Graft To Mid LAD LIMA graft was visualized by angiography and is normal in caliber.  Sequential Graft To 1st Diag, 1st Mrg  Intervention  No interventions have been documented.   STRESS TESTS  NM PET CT CARDIAC PERFUSION MULTI W/ABSOLUTE BLOODFLOW 02/14/2023  Narrative   LV perfusion is abnormal. There is evidence of ischemia. Defect 1: There is a large defect with severe reduction in uptake present in the mid to basal  lateral location(s) that is reversible. There is abnormal wall motion in the defect area. Consistent with ischemia.   Rest left ventricular function is normal. Rest EF: 52%. Stress left ventricular function is abnormal. Stress global function is moderately reduced. There was a single regional abnormality. Stress EF: 44%. End diastolic cavity size is normal. End systolic cavity size is normal.   Myocardial blood flow reserve is not reported in this patient due to technical or patient-specific concerns that affect accuracy. Prior CABG   Coronary calcium  assessment not performed due to prior revascularization.  Prior CABG   Findings are consistent with ischemia. The study is high risk.   Large lateral ischemic territory mid/base with TID and drop in EF with stress MBPF/Calcium  not reported due to prior CABG  CLINICAL DATA:  This over-read does not include interpretation of cardiac or coronary anatomy or  pathology. The Cardiac PET CT interpretation by the cardiologist is attached.  COMPARISON:  None Available.  FINDINGS: Cardiovascular: Aortic atherosclerosis. Dense aortic valve calcifications. Cardiomegaly extensive three-vessel coronary artery calcifications status post median sternotomy and CABG. No pericardial effusion.  Mediastinum/Nodes: No enlarged mediastinal, hilar, or axillary lymph nodes. Thyroid  gland, trachea, and esophagus demonstrate no significant findings.  Lungs/Pleura: Emphysema. Bibasilar scarring or atelectasis. No pleural effusion or pneumothorax.  Upper Abdomen: No acute abnormality. Small volume perihepatic ascites  Musculoskeletal: No chest wall abnormality. No acute osseous findings.  IMPRESSION: 1. Emphysema. 2. Cardiomegaly and extensive three-vessel coronary artery calcifications status post median sternotomy and CABG. 3. Dense aortic valve calcifications. Correlate for echocardiographic evidence of aortic stenosis. 4. Small volume perihepatic ascites.  Aortic Atherosclerosis (ICD10-I70.0) and Emphysema (ICD10-J43.9).   Electronically Signed By: Marolyn JONETTA Jaksch M.D. On: 02/14/2023 13:25   ECHOCARDIOGRAM  ECHOCARDIOGRAM COMPLETE 09/16/2023  Narrative ECHOCARDIOGRAM REPORT    Patient Name:   JAYLUN FLEENER Date of Exam: 09/16/2023 Medical Rec #:  [id number]       Height:       71.0 in Accession #:    [id number]      Weight:       158.1 lb Date of Birth:  11/07/1933        BSA:          1.908 m Patient Age:    89 years        BP:           138/63 mmHg Patient Gender: M               HR:           74 bpm. Exam Location:  Inpatient  Procedure: 2D Echo, 3D Echo, Cardiac Doppler and Color Doppler (Both Spectral and Color Flow Doppler were utilized during procedure).  Indications:    Elevated Troponin  History:        Patient has prior history of Echocardiogram examinations, most recent 10/06/2022. CHF, CAD, Prior CABG, PAD;  Risk Factors:Hypertension, Diabetes, Dyslipidemia and Former Smoker.  Sonographer:    Koleen Popper RDCS Referring Phys: [id number] SUBRINA SUNDIL  IMPRESSIONS   1. Left ventricular ejection fraction, by estimation, is 40 to 45%. The left ventricle has mildly decreased function. The left ventricle demonstrates regional wall motion abnormalities (see scoring diagram/findings for description). Left ventricular diastolic parameters are consistent with Grade II diastolic dysfunction (pseudonormalization). Elevated left atrial pressure. There is severe hypokinesis of the left ventricular, entire inferolateral wall. There is mild hypokinesis of the left ventricular, basal-mid lateral wall and inferior wall. 2. Right ventricular systolic function is mildly  reduced. The right ventricular size is normal. There is normal pulmonary artery systolic pressure. The estimated right ventricular systolic pressure is 20.1 mmHg. 3. Left atrial size was moderately dilated. 4. The mitral valve is degenerative. Mild to moderate mitral valve regurgitation. No evidence of mitral stenosis. Moderate mitral annular calcification. 5. The aortic valve is tricuspid. There is severe calcifcation of the aortic valve. There is severe thickening of the aortic valve. Aortic valve regurgitation is trivial. Severe aortic valve stenosis. Aortic valve mean gradient measures 31.0 mmHg. Aortic valve Vmax measures 3.68 m/s.  Comparison(s): The left ventricular function is worsened. The left ventricular wall motion abnormalities are new. Aortic stenosis is worse and meets criteria for low flow low gradient severe aortic stenosis.  FINDINGS Left Ventricle: Left ventricular ejection fraction, by estimation, is 40 to 45%. The left ventricle has mildly decreased function. The left ventricle demonstrates regional wall motion abnormalities. Severe hypokinesis of the left ventricular, entire inferolateral wall. Mild hypokinesis of the left  ventricular, basal-mid lateral wall and inferior wall. 3D ejection fraction reviewed and evaluated as part of the interpretation. Alternate measurement of EF is felt to be most reflective of LV function. The left ventricular internal cavity size was normal in size. There is no left ventricular hypertrophy. Abnormal (paradoxical) septal motion consistent with post-operative status. Left ventricular diastolic parameters are consistent with Grade II diastolic dysfunction (pseudonormalization). Elevated left atrial pressure.   LV Wall Scoring: The antero-lateral wall, posterior wall, and basal inferior segment are hypokinetic. The entire anterior wall, entire septum, entire apex, and mid and distal inferior wall are normal.  Right Ventricle: The right ventricular size is normal. No increase in right ventricular wall thickness. Right ventricular systolic function is mildly reduced. There is normal pulmonary artery systolic pressure. The tricuspid regurgitant velocity is 2.07 m/s, and with an assumed right atrial pressure of 3 mmHg, the estimated right ventricular systolic pressure is 20.1 mmHg.  Left Atrium: Left atrial size was moderately dilated.  Right Atrium: Right atrial size was normal in size.  Pericardium: There is no evidence of pericardial effusion.  Mitral Valve: The mitral valve is degenerative in appearance. Moderate mitral annular calcification. Mild to moderate mitral valve regurgitation, with centrally-directed jet. No evidence of mitral valve stenosis.  Tricuspid Valve: The tricuspid valve is normal in structure. Tricuspid valve regurgitation is trivial.  Aortic Valve: The aortic valve is tricuspid. There is severe calcifcation of the aortic valve. There is severe thickening of the aortic valve. Aortic valve regurgitation is trivial. Severe aortic stenosis is present. Aortic valve mean gradient measures 31.0 mmHg. Aortic valve peak gradient measures 54.0 mmHg. Aortic valve area,  by VTI measures 0.73 cm.  Pulmonic Valve: The pulmonic valve was not well visualized. Pulmonic valve regurgitation is not visualized. No evidence of pulmonic stenosis.  Aorta: The aortic root is normal in size and structure.  IAS/Shunts: No atrial level shunt detected by color flow Doppler.  Additional Comments: 3D was performed not requiring image post processing on an independent workstation and was abnormal.   LEFT VENTRICLE PLAX 2D LVIDd:         4.70 cm   Diastology LVIDs:         4.40 cm   LV e' medial:    3.65 cm/s LV PW:         1.10 cm   LV E/e' medial:  27.9 LV IVS:        1.10 cm   LV e' lateral:   6.00 cm/s LVOT  diam:     1.94 cm   LV E/e' lateral: 17.0 LV SV:         57 LV SV Index:   30 LVOT Area:     2.96 cm  3D Volume EF: 3D EF:        32 % LV EDV:       136 ml LV ESV:       93 ml LV SV:        43 ml  RIGHT VENTRICLE            IVC RV Basal diam:  3.80 cm    IVC diam: 1.30 cm RV S prime:     8.60 cm/s TAPSE (M-mode): 2.0 cm  LEFT ATRIUM             Index        RIGHT ATRIUM           Index LA diam:        4.80 cm 2.52 cm/m   RA Area:     12.70 cm LA Vol (A2C):   50.8 ml 26.63 ml/m  RA Volume:   30.10 ml  15.78 ml/m LA Vol (A4C):   54.9 ml 28.77 ml/m LA Biplane Vol: 55.1 ml 28.88 ml/m AORTIC VALVE AV Area (Vmax):    0.72 cm AV Area (Vmean):   0.66 cm AV Area (VTI):     0.73 cm AV Vmax:           367.50 cm/s AV Vmean:          260.500 cm/s AV VTI:            0.783 m AV Peak Grad:      54.0 mmHg AV Mean Grad:      31.0 mmHg LVOT Vmax:         89.10 cm/s LVOT Vmean:        58.200 cm/s LVOT VTI:          0.194 m LVOT/AV VTI ratio: 0.25  AORTA Ao Root diam: 3.40 cm Ao Asc diam:  2.90 cm  MITRAL VALVE                TRICUSPID VALVE MV Area (PHT): 3.31 cm     TR Peak grad:   17.1 mmHg MV Decel Time: 229 msec     TR Vmax:        207.00 cm/s MV E velocity: 102.00 cm/s MV A velocity: 134.00 cm/s  SHUNTS MV E/A ratio:  0.76         Systemic  VTI:  0.19 m Systemic Diam: 1.94 cm  Jerel Croitoru MD Electronically signed by Jerel Balding MD Signature Date/Time: 09/16/2023/2:05:05 PM    Final          ______________________________________________________________________________________________       Laboratory Data: High Sensitivity Troponin:   Recent Labs  Lab 09/15/23 1720 09/15/23 2116 09/16/23 0637  TROPONINIHS 7,244* 5,707* 5,079*     Chemistry Recent Labs  Lab 09/15/23 1142 09/17/23 0632  NA 142 140  K 3.3* 3.2*  CL 96* 95*  CO2 26 26  GLUCOSE 256* 191*  BUN 81* 83*  CREATININE 17.05* 16.33*  CALCIUM  8.2* 8.0*  GFRNONAA 2* 3*  ANIONGAP 20* 19*    Recent Labs  Lab 09/15/23 1142 09/17/23 0632  PROT 5.1* 4.9*  ALBUMIN 1.9* 1.7*  AST 24 12*  ALT <5 <5  ALKPHOS 53 49  BILITOT 0.6 0.5   Lipids No results  for input(s): CHOL, TRIG, HDL, LABVLDL, LDLCALC, CHOLHDL in the last 168 hours.  Hematology Recent Labs  Lab 09/15/23 1142 09/16/23 0637 09/17/23 0632  WBC 10.4 8.3 9.0  RBC 2.92* 3.03* 3.02*  HGB 9.6* 9.8* 9.8*  HCT 30.1* 30.5* 30.4*  MCV 103.1* 100.7* 100.7*  MCH 32.9 32.3 32.5  MCHC 31.9 32.1 32.2  RDW 15.5 15.2 15.1  PLT 173 165 154   Thyroid  No results for input(s): TSH, FREET4 in the last 168 hours.  BNPNo results for input(s): BNP, PROBNP in the last 168 hours.  DDimer No results for input(s): DDIMER in the last 168 hours.  Radiology/Studies:  ECHOCARDIOGRAM COMPLETE Result Date: 09/16/2023    ECHOCARDIOGRAM REPORT   Patient Name:   LYRIC HOAR Date of Exam: 09/16/2023 Medical Rec #:  [id number]       Height:       71.0 in Accession #:    [id number]      Weight:       158.1 lb Date of Birth:  March 18, 1933        BSA:          1.908 m Patient Age:    89 years        BP:           138/63 mmHg Patient Gender: M               HR:           74 bpm. Exam Location:  Inpatient Procedure: 2D Echo, 3D Echo, Cardiac Doppler and Color Doppler (Both Spectral             and Color Flow Doppler were utilized during procedure). Indications:    Elevated Troponin  History:        Patient has prior history of Echocardiogram examinations, most                 recent 10/06/2022. CHF, CAD, Prior CABG, PAD; Risk                 Factors:Hypertension, Diabetes, Dyslipidemia and Former Smoker.  Sonographer:    Koleen Popper RDCS Referring Phys: [id number] SUBRINA SUNDIL IMPRESSIONS  1. Left ventricular ejection fraction, by estimation, is 40 to 45%. The left ventricle has mildly decreased function. The left ventricle demonstrates regional wall motion abnormalities (see scoring diagram/findings for description). Left ventricular diastolic parameters are consistent with Grade II diastolic dysfunction (pseudonormalization). Elevated left atrial pressure. There is severe hypokinesis of the left ventricular, entire inferolateral wall. There is mild hypokinesis of the left ventricular, basal-mid lateral wall and inferior wall.  2. Right ventricular systolic function is mildly reduced. The right ventricular size is normal. There is normal pulmonary artery systolic pressure. The estimated right ventricular systolic pressure is 20.1 mmHg.  3. Left atrial size was moderately dilated.  4. The mitral valve is degenerative. Mild to moderate mitral valve regurgitation. No evidence of mitral stenosis. Moderate mitral annular calcification.  5. The aortic valve is tricuspid. There is severe calcifcation of the aortic valve. There is severe thickening of the aortic valve. Aortic valve regurgitation is trivial. Severe aortic valve stenosis. Aortic valve mean gradient measures 31.0 mmHg. Aortic valve Vmax measures 3.68 m/s. Comparison(s): The left ventricular function is worsened. The left ventricular wall motion abnormalities are new. Aortic stenosis is worse and meets criteria for low flow low gradient severe aortic stenosis. FINDINGS  Left Ventricle: Left ventricular ejection fraction, by estimation, is 40 to  45%. The left  ventricle has mildly decreased function. The left ventricle demonstrates regional wall motion abnormalities. Severe hypokinesis of the left ventricular, entire inferolateral wall. Mild hypokinesis of the left ventricular, basal-mid lateral wall and inferior wall. 3D ejection fraction reviewed and evaluated as part of the interpretation. Alternate measurement of EF is felt to be most reflective of LV function. The left ventricular internal cavity size was normal in size. There is no left ventricular hypertrophy. Abnormal (paradoxical) septal motion consistent with post-operative status. Left ventricular diastolic parameters are consistent with Grade II diastolic dysfunction (pseudonormalization). Elevated left atrial pressure.  LV Wall Scoring: The antero-lateral wall, posterior wall, and basal inferior segment are hypokinetic. The entire anterior wall, entire septum, entire apex, and mid and distal inferior wall are normal. Right Ventricle: The right ventricular size is normal. No increase in right ventricular wall thickness. Right ventricular systolic function is mildly reduced. There is normal pulmonary artery systolic pressure. The tricuspid regurgitant velocity is 2.07 m/s, and with an assumed right atrial pressure of 3 mmHg, the estimated right ventricular systolic pressure is 20.1 mmHg. Left Atrium: Left atrial size was moderately dilated. Right Atrium: Right atrial size was normal in size. Pericardium: There is no evidence of pericardial effusion. Mitral Valve: The mitral valve is degenerative in appearance. Moderate mitral annular calcification. Mild to moderate mitral valve regurgitation, with centrally-directed jet. No evidence of mitral valve stenosis. Tricuspid Valve: The tricuspid valve is normal in structure. Tricuspid valve regurgitation is trivial. Aortic Valve: The aortic valve is tricuspid. There is severe calcifcation of the aortic valve. There is severe thickening of the aortic  valve. Aortic valve regurgitation is trivial. Severe aortic stenosis is present. Aortic valve mean gradient measures 31.0 mmHg. Aortic valve peak gradient measures 54.0 mmHg. Aortic valve area, by VTI measures 0.73 cm. Pulmonic Valve: The pulmonic valve was not well visualized. Pulmonic valve regurgitation is not visualized. No evidence of pulmonic stenosis. Aorta: The aortic root is normal in size and structure. IAS/Shunts: No atrial level shunt detected by color flow Doppler. Additional Comments: 3D was performed not requiring image post processing on an independent workstation and was abnormal.  LEFT VENTRICLE PLAX 2D LVIDd:         4.70 cm   Diastology LVIDs:         4.40 cm   LV e' medial:    3.65 cm/s LV PW:         1.10 cm   LV E/e' medial:  27.9 LV IVS:        1.10 cm   LV e' lateral:   6.00 cm/s LVOT diam:     1.94 cm   LV E/e' lateral: 17.0 LV SV:         57 LV SV Index:   30 LVOT Area:     2.96 cm                           3D Volume EF:                          3D EF:        32 %                          LV EDV:       136 ml  LV ESV:       93 ml                          LV SV:        43 ml RIGHT VENTRICLE            IVC RV Basal diam:  3.80 cm    IVC diam: 1.30 cm RV S prime:     8.60 cm/s TAPSE (M-mode): 2.0 cm LEFT ATRIUM             Index        RIGHT ATRIUM           Index LA diam:        4.80 cm 2.52 cm/m   RA Area:     12.70 cm LA Vol (A2C):   50.8 ml 26.63 ml/m  RA Volume:   30.10 ml  15.78 ml/m LA Vol (A4C):   54.9 ml 28.77 ml/m LA Biplane Vol: 55.1 ml 28.88 ml/m  AORTIC VALVE AV Area (Vmax):    0.72 cm AV Area (Vmean):   0.66 cm AV Area (VTI):     0.73 cm AV Vmax:           367.50 cm/s AV Vmean:          260.500 cm/s AV VTI:            0.783 m AV Peak Grad:      54.0 mmHg AV Mean Grad:      31.0 mmHg LVOT Vmax:         89.10 cm/s LVOT Vmean:        58.200 cm/s LVOT VTI:          0.194 m LVOT/AV VTI ratio: 0.25  AORTA Ao Root diam: 3.40 cm Ao Asc diam:  2.90 cm  MITRAL VALVE                TRICUSPID VALVE MV Area (PHT): 3.31 cm     TR Peak grad:   17.1 mmHg MV Decel Time: 229 msec     TR Vmax:        207.00 cm/s MV E velocity: 102.00 cm/s MV A velocity: 134.00 cm/s  SHUNTS MV E/A ratio:  0.76         Systemic VTI:  0.19 m                             Systemic Diam: 1.94 cm Jerel Croitoru MD Electronically signed by Jerel Balding MD Signature Date/Time: 09/16/2023/2:05:05 PM    Final    DG Abd 1 View Result Date: 09/16/2023 CLINICAL DATA:  Peritoneal dialysis catheter dysfunction EXAM: ABDOMEN - 1 VIEW COMPARISON:  06/24/2018 FINDINGS: Peritoneal dialysis catheter is unchanged, coiled over the mid pelvis. Normal abdominal gas pattern. No organomegaly. Advanced vascular calcifications. Brachytherapy seeds overlie the prostate gland. Advanced degenerative changes noted within the lumbar spine. IMPRESSION: 1. Peritoneal dialysis catheter coiled over the mid pelvis. Electronically Signed   By: Dorethia Molt M.D.   On: 09/16/2023 10:46   CT HEAD WO CONTRAST ( ) Result Date: 09/15/2023 CLINICAL DATA:  89 year old male with altered mental status and decreased appetite for 4 days. Peritoneal dialysis patient. EXAM: CT HEAD WITHOUT CONTRAST TECHNIQUE: Contiguous axial images were obtained from the base of the skull through the vertex without intravenous contrast. RADIATION DOSE REDUCTION: This exam was performed according to the  departmental dose-optimization program which includes automated exposure control, adjustment of the mA and/or kV according to patient size and/or use of iterative reconstruction technique. COMPARISON:  Head CT 03/07/2023.  Brain MRI 08/27/2021. FINDINGS: Brain: Stable cerebral volume. No midline shift, ventriculomegaly, mass effect, evidence of mass lesion, intracranial hemorrhage or evidence of cortically based acute infarction. Stable gray-white matter differentiation throughout the brain. Patchy and confluent chronic white matter hypodensity.  Vascular: Calcified atherosclerosis at the skull base. No suspicious intracranial vascular hyperdensity. Skull: Stable and intact.  No acute osseous abnormality identified. Sinuses/Orbits: Chronic left maxillary sinusitis. Other Visualized paranasal sinuses and mastoids are stable and well aerated. Other: Calcified scalp vessel atherosclerosis diffusely. No acute orbit soft tissue finding. IMPRESSION: 1. No acute intracranial abnormality. 2. Stable non contrast CT appearance of advanced cerebral white matter disease. 3. Advanced calcified atherosclerosis. Electronically Signed   By: VEAR Hurst M.D.   On: 09/15/2023 12:48   DG Chest Portable 1 View Result Date: 09/15/2023 CLINICAL DATA:  Weakness EXAM: PORTABLE CHEST 1 VIEW COMPARISON:  03/07/2023 FINDINGS: Stable double-lumen right IJ catheter. Status post median sternotomy. Stable cardiopericardial silhouette calcified aorta. Slight prominence of central vasculature. Tiny pleural effusions identified with some mild lung base opacities. No pneumothorax. Overlapping cardiac leads. IMPRESSION: Postop chest with prominent central vasculature.  Right IJ catheter. Tiny pleural effusions with some adjacent opacity, right-greater-than-left. Recommend follow-up. Electronically Signed   By: Ranell Bring M.D.   On: 09/15/2023 12:33     Assessment and Plan:  Elevated Troponin  CAD s/p CABG  - Patient previously underwent CABG in 1987 with LIMA-LAD, sequential SVG-diag-OM.  - Had a cardiac PET in 01/2023 that showed a large defect in the mid to basal lateral locations with abnormal wall motion. Underwent cath that showed progression of native progression of native disease with 100% CTO of left main and diffuse mild/moderate disease heavily calcified RCA.  LIMA-LAD was patent.  There was ostial, mid, distal disease in sequential SVG-diagonal-OM with CTO sequential limb to OM. Given patient's advanced age, lack of anginal symptoms, and 89 year old grafts, recommended  medical management  - Now admitted with AMS. hsTn [phone]  -Given downward trending troponin and lack of chest pain, patient was not started on IV heparin . - Patient has complex disease- with his current AMS/dementia, frailty, advanced age, and comorbidities, suspect that cath and possible interventions would be high risk. As risks outweigh the benefits, plan for medical management  of CAD  - Continue ASA 81 mg daily  - Continue lipitor  80 mg daily  - Continue carvedilol  3.125 mg BID   HFrEF  - Echo this admission showed EF 40-45% with wall motion abnormalities, grade 2 DD, mildly reduced RV systolic function, normal PA pressure mild/moderate MR, severe AS - Patient euvolemic on exam. No indication for diuresis. Volume managed by PD  - AS above, patient not a candidate for ischemic eval - Continue carvedilol  3.125 mg BID  - GDMT otherwise limited by renal function   Severe AS  - Noted on echo this admission- mean gradient 31 mmHg, Vmax 3.68 m/s. Met criteria for low flow low gradient severe AS  - Unlikely patient will be a candidate for TAVR   Otherwise per primary  - Acute metabolic encephalopathy  - GI symptoms - nausea, diarrhea  - ESRD on peritoneal dialysis  - Type 2 DM   Risk Assessment/Risk Scores:   For questions or updates, please contact  [facility] Please consult [email or web address] for contact info under  Signed, Rollo FABIENE Louder, PA-C  09/17/2023 10:56 AM

## 2023-09-17 NOTE — Progress Notes (Signed)
 Heart Failure Navigator Progress Note  Assessed for Heart & Vascular TOC clinic readiness.  Patient does not meet criteria due to ESRD on Peritoneal Dialysis, No HF TOC.   Navigator will sign off at this time.   Stephane Haddock, BSN, Scientist, clinical (histocompatibility and immunogenetics) Only

## 2023-09-17 NOTE — Progress Notes (Signed)
   09/17/23 1521  Peritoneal Catheter Right lower abdomen  No placement date or time found.   Catheter Location: Right lower abdomen  Site Assessment Clean, Dry, Intact  Drainage Description None  Catheter status Capped  Dressing Gauze/Drain sponge  Dressing Status Clean, Dry, Intact  Dressing Intervention Assessed, no intervention needed  Completion  Treatment Status Complete  Initial Drain Volume 22  Average Dwell Time-Hour(s) 1  Average Dwell Time-Min(s) 30  Average Drain Time 83  Total Therapy Volume 9997  Total Therapy Time-Hour(s) 16  Total Therapy Time-Min(s) 39  Effluent Appearance Yellow;Clear  Fluid Balance - CCPD  Total UF (+ value on cycler, pt loss) 63 mL  Procedure Comments  Tolerated treatment well? Yes (Pt treatment extended due to last drain-pt repositioned continually until drain complete)  Education / Care Plan  Dialysis Education Provided Yes  Hand-off documentation  Hand-off Given Given to shift RN/LPN  Report given to (Full Name) Meliton Cree, RN

## 2023-09-17 NOTE — Plan of Care (Signed)
   Problem: Health Behavior/Discharge Planning: Goal: Ability to manage health-related needs will improve Outcome: Progressing   Problem: Clinical Measurements: Goal: Ability to maintain clinical measurements within normal limits will improve Outcome: Progressing Goal: Will remain free from infection Outcome: Progressing

## 2023-09-17 NOTE — TOC Initial Note (Signed)
 Transition of Care [facility]) - Initial/Assessment Note    Patient Details  Name: [name] MRN: [id number] Date of Birth: 05-Nov-1933  [facility] of [facility]) CM/SW Contact:    Lauraine FORBES Saa, LCSW Phone Number: 09/17/2023, 2:35 PM  Clinical Narrative:                  2:36 PM CSW introduced self and role to patient's spouse, [name] (patient is not currently fully oriented). [name] confirmed she resides at home with patient and could provide patient transportation upon discharge but expressed interest in additional transportation resources to assist patient in attending appointments. CSW provided transportation resources. [name] confirmed patient has DME (rolling walker, cane) history (per chart review, with [facility]) and expressed interest in hospital bed. CSW informed RNCM of DME interest. [name] confirmed patient's HH history (per chart review, with [facility] and [facility]). [name] denied patient SNF history. Per chart review, patient has a PCP and insurance. Patient's preferred pharmacy is [phone] [place].  Expected Discharge Plan: Home/Self Care Barriers to Discharge: Continued Medical Work up   Patient Goals and CMS Choice            Expected Discharge Plan and Services       Living arrangements for the past 2 months: Single Family Home                                      Prior Living Arrangements/Services Living arrangements for the past 2 months: Single Family Home Lives with:: Spouse Patient language and need for interpreter reviewed:: Yes        Need for Family Participation in Patient Care: Yes (Comment)   Current home services: DME Criminal Activity/Legal Involvement Pertinent to Current Situation/Hospitalization: No - Comment as needed  Activities of Daily Living      Permission Sought/Granted Permission sought to share information with : Family Supports Permission granted to share information with : No (Contact information on chart)  Share  Information with NAME: [name]     Permission granted to share info w Relationship: Spouse  Permission granted to share info w Contact Information: [phone]  Emotional Assessment Appearance:: Appears stated age Attitude/Demeanor/Rapport: Unable to Assess Affect (typically observed): Unable to Assess Orientation: : Oriented to Self Alcohol  / Substance Use: Not Applicable Psych Involvement: No (comment)  Admission diagnosis:  End stage renal disease on dialysis (HCC) [N18.6, Z99.2] Acute encephalopathy [G93.40] Encephalopathy, unspecified type [G93.40] AKI (acute kidney injury) (HCC) [N17.9] Patient Active Problem List   Diagnosis Date Noted   AKI (acute kidney injury) (HCC) 09/16/2023   Acute encephalopathy 09/15/2023   Peritoneal dialysis catheter dysfunction (HCC) 06/22/2022   DNR (do not resuscitate) 06/22/2022   Hypokalemia    ESRD on peritoneal dialysis (HCC)    Nephrotic range proteinuria    Difficulty urinating    SOB (shortness of breath) on exertion 06/20/2017   Other fatigue 06/19/2017   Congestive heart failure (HCC) 02/28/2017   Hypertension    Coronary artery disease    Moderate aortic stenosis by prior echocardiogram - mean gradient 24.3 mmHg. peak gradient 42.9 mmHg. Aortic valve area, by VTI measures 1.15  cm. 10/16/2016   Uncontrolled hypersomnia 10/07/2015   Murmur 09/12/2013   Dizziness 08/22/2012   Hx of CABG 08/22/2012   Essential hypertension 08/22/2012   Dyslipidemia 08/22/2012   PAD (peripheral artery disease) (HCC) 08/22/2012   DM2 (diabetes mellitus, type 2) (HCC)  08/22/2012   PCP:  Corlis Pagan, NP Pharmacy:   [phone] [address], [place] - [address] [place] [address] Phone: [phone] Fax: [phone]     Social Drivers of Health (SDOH) Social History: SDOH Screenings   Food Insecurity: Unknown (09/15/2023)  Housing: Unknown (09/15/2023)  Transportation Needs: No Transportation Needs  (03/07/2023)  Utilities: Not At Risk (03/07/2023)  Depression (PHQ2-9): Low Risk  (07/13/2020)  Physical Activity: Sufficiently Active (10/31/2019)  Social Connections: Socially Isolated (03/07/2023)  Stress: No Stress Concern Present (06/29/2023)   Received from [facility]  Tobacco Use: Medium Risk (09/15/2023)   SDOH Interventions:     Readmission Risk Interventions    06/27/2022    3:51 PM  Readmission Risk Prevention Plan  Transportation Screening Complete  PCP or Specialist Appt within 5-7 Days Complete  Home Care Screening Complete  Medication Review (RN CM) Referral to Pharmacy

## 2023-09-17 NOTE — Discharge Instructions (Signed)
 [name]

## 2023-09-17 NOTE — Progress Notes (Signed)
 [facility] PROGRESS NOTE    Progress Note  [name]  FMW:[id number] DOB: 1933-05-12 DOA: 09/15/2023 PCP: Corlis Pagan, NP     Brief Narrative:   [name] is an 89 y.o. male past medical history of essential hypertension end-stage renal disease on peritoneal dialysis, baseline dementia CAD status post CABG moderate aortic stenosis chronic diastolic dysfunction presents with confusion, family relates he had a decreased oral intake for the past 4 days.  2 days prior to admission started having nausea vomiting.  He describes the food feels like it stopping and bothers him as he swallows.   Assessment/Plan:   Acute metabolic encephalopathy superimposed with baseline dementia: - With nausea vomiting and diarrhea that started 2 days prior to admission, and decreased oral intake 4 days prior to admission. - Has remained afebrile with no leukocytosis chest x-ray appears to be clean.   - CT of the head showed no acute findings. - Nephrology has been consulted and plans to continue peritoneal dialysis - He does have a history of recent peritonitis - Peritoneal fluid sent for sampling for culture and cell count.  Procalcitonin is low yield.  GI symptoms nausea and diarrhea: - Started on PPI and Zofran . - He has been complaining of some dysphagia continue IV Diflucan . - Peritoneal fluid sent for cell count and culture he has remained afebrile with no leukocytosis, check a procalcitonin.  Elevated troponins/severe aortic stenosis: - Elevated troponins trending down, 2D echo was done that showed an EF of 45% with new left ventricular wall motion abnormality, grade 2 diastolic dysfunction and mild hypokinesia of the left inferolateral wall.  Aortic valve was severely thickening with severe aortic stenosis with a gradient of 31 mmHg - Had a cardiac cath in December 2024 and cardiology at that time given his advanced age lack of angina the best course of action would be to titrate  medical therapy for existing CAD. - Consult cardiology.  Continue aspirin  statins and beta-blockers  PAD/CAD: - With a CABG 37 years ago.  He denies any chest pain or shortness of breath. - Left heart cath on December 2024 showed mild to moderate disease, continue aspirin  and statins. - Patient has known coronary artery disease and no intervention were done and cath in 2024 and medical management was recommended.  End-stage renal disease on peritoneal dialysis: - Nephrology was consulted continue PD.  As per wife he has continue peritoneal dialysis every night.  Did not completed on the night of admission due to confusion. - Peritoneal fluid sent for sampling but he has remained afebrile. -Procalcitonin is low yield  Diabetes mellitus type 2: - Blood glucose trending up, increase long-acting insulin , continue sliding scale. -Tolerating his   Chronic diastolic dysfunction: With an EF of 65% normal RV. Holding Lasix  for now.  Essential hypertension Continue Coreg  blood pressure is stable.  DNR (do not resuscitate) Confirmed with wife.   DVT prophylaxis: lovenox Family Communication:none Status is: Observation The patient remains OBS appropriate and will d/c before 2 midnights.    Code Status:     Code Status Orders  (From admission, onward)           Start     Ordered   09/15/23 1556  Do not attempt resuscitation (DNR) Pre-Arrest Interventions Desired  Continuous       Question Answer Comment  If pulseless and not breathing No CPR or chest compressions.   In Pre-Arrest Conditions (Patient Has Pulse and Is Breathing) May intubate, use advanced  airway interventions and cardioversion/ACLS medications if appropriate or indicated. May transfer to ICU.   Consent: Discussion documented in EHR or advanced directives reviewed      09/15/23 1612           Code Status History     Date Active Date Inactive Code Status Order ID Comments User Context   03/07/2023 1342  03/10/2023 2128 Do not attempt resuscitation (DNR) PRE-ARREST INTERVENTIONS DESIRED [id number]  Moody Alto, MD ED   02/27/2023 1253 02/27/2023 2156 Full Code [id number]  Anner Alm ORN, MD Inpatient   06/22/2022 2016 07/04/2022 2151 DNR [id number]  Laurence Locus, DO ED   06/22/2022 1948 06/22/2022 2016 DNR [id number]  Laurence Locus, DO ED   04/18/2019 1922 04/25/2019 1924 Full Code [id number]  Claudene Pacific, MD ED   08/08/2017 1717 08/15/2017 1904 Full Code [id number]  Abigail Raphael SAILOR, PA-C Inpatient   06/20/2017 1935 06/21/2017 2037 Full Code [id number]  Dock Bruno HERO., PA-C Inpatient         IV Access:   Peripheral IV   Procedures and diagnostic studies:   ECHOCARDIOGRAM COMPLETE Result Date: 09/16/2023    ECHOCARDIOGRAM REPORT   Patient Name:   [name] Date of Exam: 09/16/2023 Medical Rec #:  [id number]       Height:       71.0 in Accession #:    [id number]      Weight:       158.1 lb Date of Birth:  1934-01-11        BSA:          1.908 m Patient Age:    89 years        BP:           138/63 mmHg Patient Gender: M               HR:           74 bpm. Exam Location:  Inpatient Procedure: 2D Echo, 3D Echo, Cardiac Doppler and Color Doppler (Both Spectral            and Color Flow Doppler were utilized during procedure). Indications:    Elevated Troponin  History:        Patient has prior history of Echocardiogram examinations, most                 recent 10/06/2022. CHF, CAD, Prior CABG, PAD; Risk                 Factors:Hypertension, Diabetes, Dyslipidemia and Former Smoker.  Sonographer:    Koleen Popper RDCS Referring Phys: [id number] SUBRINA SUNDIL IMPRESSIONS  1. Left ventricular ejection fraction, by estimation, is 40 to 45%. The left ventricle has mildly decreased function. The left ventricle demonstrates regional wall motion abnormalities (see scoring diagram/findings for description). Left ventricular diastolic parameters are consistent with Grade II diastolic dysfunction (pseudonormalization). Elevated  left atrial pressure. There is severe hypokinesis of the left ventricular, entire inferolateral wall. There is mild hypokinesis of the left ventricular, basal-mid lateral wall and inferior wall.  2. Right ventricular systolic function is mildly reduced. The right ventricular size is normal. There is normal pulmonary artery systolic pressure. The estimated right ventricular systolic pressure is 20.1 mmHg.  3. Left atrial size was moderately dilated.  4. The mitral valve is degenerative. Mild to moderate mitral valve regurgitation. No evidence of mitral stenosis. Moderate mitral annular calcification.  5. The aortic valve is tricuspid. There is severe calcifcation of the aortic valve. There  is severe thickening of the aortic valve. Aortic valve regurgitation is trivial. Severe aortic valve stenosis. Aortic valve mean gradient measures 31.0 mmHg. Aortic valve Vmax measures 3.68 m/s. Comparison(s): The left ventricular function is worsened. The left ventricular wall motion abnormalities are new. Aortic stenosis is worse and meets criteria for low flow low gradient severe aortic stenosis. FINDINGS  Left Ventricle: Left ventricular ejection fraction, by estimation, is 40 to 45%. The left ventricle has mildly decreased function. The left ventricle demonstrates regional wall motion abnormalities. Severe hypokinesis of the left ventricular, entire inferolateral wall. Mild hypokinesis of the left ventricular, basal-mid lateral wall and inferior wall. 3D ejection fraction reviewed and evaluated as part of the interpretation. Alternate measurement of EF is felt to be most reflective of LV function. The left ventricular internal cavity size was normal in size. There is no left ventricular hypertrophy. Abnormal (paradoxical) septal motion consistent with post-operative status. Left ventricular diastolic parameters are consistent with Grade II diastolic dysfunction (pseudonormalization). Elevated left atrial pressure.  LV Wall  Scoring: The antero-lateral wall, posterior wall, and basal inferior segment are hypokinetic. The entire anterior wall, entire septum, entire apex, and mid and distal inferior wall are normal. Right Ventricle: The right ventricular size is normal. No increase in right ventricular wall thickness. Right ventricular systolic function is mildly reduced. There is normal pulmonary artery systolic pressure. The tricuspid regurgitant velocity is 2.07 m/s, and with an assumed right atrial pressure of 3 mmHg, the estimated right ventricular systolic pressure is 20.1 mmHg. Left Atrium: Left atrial size was moderately dilated. Right Atrium: Right atrial size was normal in size. Pericardium: There is no evidence of pericardial effusion. Mitral Valve: The mitral valve is degenerative in appearance. Moderate mitral annular calcification. Mild to moderate mitral valve regurgitation, with centrally-directed jet. No evidence of mitral valve stenosis. Tricuspid Valve: The tricuspid valve is normal in structure. Tricuspid valve regurgitation is trivial. Aortic Valve: The aortic valve is tricuspid. There is severe calcifcation of the aortic valve. There is severe thickening of the aortic valve. Aortic valve regurgitation is trivial. Severe aortic stenosis is present. Aortic valve mean gradient measures 31.0 mmHg. Aortic valve peak gradient measures 54.0 mmHg. Aortic valve area, by VTI measures 0.73 cm. Pulmonic Valve: The pulmonic valve was not well visualized. Pulmonic valve regurgitation is not visualized. No evidence of pulmonic stenosis. Aorta: The aortic root is normal in size and structure. IAS/Shunts: No atrial level shunt detected by color flow Doppler. Additional Comments: 3D was performed not requiring image post processing on an independent workstation and was abnormal.  LEFT VENTRICLE PLAX 2D LVIDd:         4.70 cm   Diastology LVIDs:         4.40 cm   LV e' medial:    3.65 cm/s LV PW:         1.10 cm   LV E/e' medial:   27.9 LV IVS:        1.10 cm   LV e' lateral:   6.00 cm/s LVOT diam:     1.94 cm   LV E/e' lateral: 17.0 LV SV:         57 LV SV Index:   30 LVOT Area:     2.96 cm                           3D Volume EF:  3D EF:        32 %                          LV EDV:       136 ml                          LV ESV:       93 ml                          LV SV:        43 ml RIGHT VENTRICLE            IVC RV Basal diam:  3.80 cm    IVC diam: 1.30 cm RV S prime:     8.60 cm/s TAPSE (M-mode): 2.0 cm LEFT ATRIUM             Index        RIGHT ATRIUM           Index LA diam:        4.80 cm 2.52 cm/m   RA Area:     12.70 cm LA Vol (A2C):   50.8 ml 26.63 ml/m  RA Volume:   30.10 ml  15.78 ml/m LA Vol (A4C):   54.9 ml 28.77 ml/m LA Biplane Vol: 55.1 ml 28.88 ml/m  AORTIC VALVE AV Area (Vmax):    0.72 cm AV Area (Vmean):   0.66 cm AV Area (VTI):     0.73 cm AV Vmax:           367.50 cm/s AV Vmean:          260.500 cm/s AV VTI:            0.783 m AV Peak Grad:      54.0 mmHg AV Mean Grad:      31.0 mmHg LVOT Vmax:         89.10 cm/s LVOT Vmean:        58.200 cm/s LVOT VTI:          0.194 m LVOT/AV VTI ratio: 0.25  AORTA Ao Root diam: 3.40 cm Ao Asc diam:  2.90 cm MITRAL VALVE                TRICUSPID VALVE MV Area (PHT): 3.31 cm     TR Peak grad:   17.1 mmHg MV Decel Time: 229 msec     TR Vmax:        207.00 cm/s MV E velocity: 102.00 cm/s MV A velocity: 134.00 cm/s  SHUNTS MV E/A ratio:  0.76         Systemic VTI:  0.19 m                             Systemic Diam: 1.94 cm Jerel Croitoru MD Electronically signed by Jerel Balding MD Signature Date/Time: 09/16/2023/2:05:05 PM    Final    DG Abd 1 View Result Date: 09/16/2023 CLINICAL DATA:  Peritoneal dialysis catheter dysfunction EXAM: ABDOMEN - 1 VIEW COMPARISON:  06/24/2018 FINDINGS: Peritoneal dialysis catheter is unchanged, coiled over the mid pelvis. Normal abdominal gas pattern. No organomegaly. Advanced vascular calcifications. Brachytherapy seeds  overlie the prostate gland. Advanced degenerative changes noted within the lumbar spine. IMPRESSION: 1. Peritoneal dialysis catheter coiled over the mid pelvis. Electronically Signed  By: Dorethia Molt M.D.   On: 09/16/2023 10:46   CT HEAD WO CONTRAST ( ) Result Date: 09/15/2023 CLINICAL DATA:  89 year old male with altered mental status and decreased appetite for 4 days. Peritoneal dialysis patient. EXAM: CT HEAD WITHOUT CONTRAST TECHNIQUE: Contiguous axial images were obtained from the base of the skull through the vertex without intravenous contrast. RADIATION DOSE REDUCTION: This exam was performed according to the departmental dose-optimization program which includes automated exposure control, adjustment of the mA and/or kV according to patient size and/or use of iterative reconstruction technique. COMPARISON:  Head CT 03/07/2023.  Brain MRI 08/27/2021. FINDINGS: Brain: Stable cerebral volume. No midline shift, ventriculomegaly, mass effect, evidence of mass lesion, intracranial hemorrhage or evidence of cortically based acute infarction. Stable gray-white matter differentiation throughout the brain. Patchy and confluent chronic white matter hypodensity. Vascular: Calcified atherosclerosis at the skull base. No suspicious intracranial vascular hyperdensity. Skull: Stable and intact.  No acute osseous abnormality identified. Sinuses/Orbits: Chronic left maxillary sinusitis. Other Visualized paranasal sinuses and mastoids are stable and well aerated. Other: Calcified scalp vessel atherosclerosis diffusely. No acute orbit soft tissue finding. IMPRESSION: 1. No acute intracranial abnormality. 2. Stable non contrast CT appearance of advanced cerebral white matter disease. 3. Advanced calcified atherosclerosis. Electronically Signed   By: VEAR Hurst M.D.   On: 09/15/2023 12:48   DG Chest Portable 1 View Result Date: 09/15/2023 CLINICAL DATA:  Weakness EXAM: PORTABLE CHEST 1 VIEW COMPARISON:  03/07/2023  FINDINGS: Stable double-lumen right IJ catheter. Status post median sternotomy. Stable cardiopericardial silhouette calcified aorta. Slight prominence of central vasculature. Tiny pleural effusions identified with some mild lung base opacities. No pneumothorax. Overlapping cardiac leads. IMPRESSION: Postop chest with prominent central vasculature.  Right IJ catheter. Tiny pleural effusions with some adjacent opacity, right-greater-than-left. Recommend follow-up. Electronically Signed   By: Ranell Bring M.D.   On: 09/15/2023 12:33     Medical Consultants:   None.   Subjective:    [name] continues to complain of dysphagia.  Objective:    Vitals:   09/16/23 1700 09/17/23 0500 09/17/23 0700 09/17/23 0756  BP: 132/79  136/71 137/68  Pulse: 75  80 82  Resp: 18   17  Temp: 98.3 F (36.8 C)  97.6 F (36.4 C) (!) 97.4 F (36.3 C)  TempSrc: Axillary  Oral Oral  SpO2: 95%  100% 100%  Weight:  57.4 kg    Height:       SpO2: 100 %   Intake/Output Summary (Last 24 hours) at 09/17/2023 0927 Last data filed at 09/16/2023 1839 Gross per 24 hour  Intake 720 ml  Output --  Net 720 ml   Filed Weights   09/15/23 1135 09/17/23 0500  Weight: 71.7 kg 57.4 kg    Exam: General exam: In no acute distress. Respiratory system: Good air movement and clear to auscultation. Cardiovascular system: S1 & S2 heard, RRR. No JVD. Gastrointestinal system: Abdomen is nondistended, soft and nontender.  Extremities: No pedal edema. Skin: No rashes, lesions or ulcers Psychiatry: Judgement and insight appear normal. Mood & affect appropriate.  Data Reviewed:    Labs: Basic Metabolic Panel: Recent Labs  Lab 09/15/23 1142 09/17/23 0632  NA 142 140  K 3.3* 3.2*  CL 96* 95*  CO2 26 26  GLUCOSE 256* 191*  BUN 81* 83*  CREATININE 17.05* 16.33*  CALCIUM  8.2* 8.0*   GFR Estimated Creatinine Clearance: 2.5 mL/min (A) (by C-G formula based on SCr of 16.33 mg/dL (H)). Liver Function  Tests: Recent Labs  Lab 09/15/23 1142 09/17/23 0632  AST 24 12*  ALT <5 <5  ALKPHOS 53 49  BILITOT 0.6 0.5  PROT 5.1* 4.9*  ALBUMIN 1.9* 1.7*   No results for input(s): LIPASE, AMYLASE in the last 168 hours. No results for input(s): AMMONIA in the last 168 hours. Coagulation profile No results for input(s): INR, PROTIME in the last 168 hours. COVID-19 Labs  No results for input(s): DDIMER, FERRITIN, LDH, CRP in the last 72 hours.  Lab Results  Component Value Date   SARSCOV2NAA NEGATIVE 03/07/2023   SARSCOV2NAA NEGATIVE 04/18/2019    CBC: Recent Labs  Lab 09/15/23 1142 09/16/23 0637 09/17/23 0632  WBC 10.4 8.3 9.0  NEUTROABS 8.7* 6.5  --   HGB 9.6* 9.8* 9.8*  HCT 30.1* 30.5* 30.4*  MCV 103.1* 100.7* 100.7*  PLT 173 165 154   Cardiac Enzymes: No results for input(s): CKTOTAL, CKMB, CKMBINDEX, TROPONINI in the last 168 hours. BNP (last 3 results) No results for input(s): PROBNP in the last 8760 hours. CBG: Recent Labs  Lab 09/16/23 1629 09/16/23 2136 09/16/23 2306 09/17/23 0008 09/17/23 0514  GLUCAP 81 51* 36* 107* 157*   D-Dimer: No results for input(s): DDIMER in the last 72 hours. Hgb A1c: Recent Labs    09/16/23 0637  HGBA1C 8.6*   Lipid Profile: No results for input(s): CHOL, HDL, LDLCALC, TRIG, CHOLHDL, LDLDIRECT in the last 72 hours. Thyroid  function studies: No results for input(s): TSH, T4TOTAL, T3FREE, THYROIDAB in the last 72 hours.  Invalid input(s): FREET3 Anemia work up: No results for input(s): VITAMINB12, FOLATE, FERRITIN, TIBC, IRON , RETICCTPCT in the last 72 hours. Sepsis Labs: Recent Labs  Lab 09/15/23 1142 09/16/23 0637 09/17/23 9367  PROCALCITON  --  0.69  --   WBC 10.4 8.3 9.0   Microbiology No results found for this or any previous visit (from the past 240 hours).   Medications:    (feeding supplement) PROSource Plus  30 mL Oral QODAY   aspirin  EC   81 mg Oral Daily   atorvastatin   80 mg Oral Daily   carvedilol   3.125 mg Oral BID WC   folic acid   0.5 mg Oral Daily   gentamicin  cream  1 Application Topical Daily   heparin   5,000 Units Subcutaneous Q8H   insulin  aspart  0-5 Units Subcutaneous QHS   insulin  aspart  0-6 Units Subcutaneous TID WC   insulin  aspart  2 Units Subcutaneous TID WC   insulin  glargine-yfgn  10 Units Subcutaneous BID   linaclotide   145 mcg Oral QAC breakfast   pantoprazole   40 mg Oral Daily   sevelamer  carbonate  2,400 mg Oral BID   sodium chloride  flush  3 mL Intravenous Q12H   Continuous Infusions:  cefTRIAXone  (ROCEPHIN )  IV 2 g (09/17/23 0551)   dialysis solution 1.5% low-MG/low-CA     dialysis solution 2.5% low-MG/low-CA     fluconazole  (DIFLUCAN ) IV        LOS: 1 day   [name]  [facility]  09/17/2023, 9:27 AM

## 2023-09-17 NOTE — Inpatient Diabetes Management (Signed)
 Inpatient Diabetes Program Recommendations  AACE/ADA: New Consensus Statement on Inpatient Glycemic Control (2015)  Target Ranges:  Prepandial:   less than 140 mg/dL      Peak postprandial:   less than 180 mg/dL (1-2 hours)      Critically ill patients:  140 - 180 mg/dL   Lab Results  Component Value Date   GLUCAP 157 (H) 09/17/2023   HGBA1C 8.6 (H) 09/16/2023    Review of Glycemic Control  Latest Reference Range & Units 09/16/23 06:28 09/16/23 11:11 09/16/23 16:29 09/16/23 21:36 09/16/23 23:06 09/17/23 00:08 09/17/23 05:14  Glucose-Capillary 70 - 99 mg/dL 712 (H) 842 (H) 81 51 (L) 36 (LL) 107 (H) 157 (H)  (LL): Data is critically low (H): Data is abnormally high (L): Data is abnormally low Diabetes history: Type 2 DM Outpatient Diabetes medications: Lantus  10 units QD Current orders for Inpatient glycemic control: Semglee  15 units BID, Novolog  2 units TID, Novolog  0-6 units TID & HS  Inpatient Diabetes Program Recommendations:    Noted hypoglycemia yesterday and subsequent increase to basal insulin . Of note, patient was also receiving meal coverage but meal consumption was documented < 50%. With ESRD, consider: -Discontinuing Novolog  2 units TID -Decrease Semglee  to 6 units every day.   Secure chat sent to MD.  Thanks, Tinnie Minus, MSN, RNC-OB Diabetes Coordinator [phone] (8a-5p)

## 2023-09-18 DIAGNOSIS — F0390 Unspecified dementia without behavioral disturbance: Secondary | ICD-10-CM

## 2023-09-18 DIAGNOSIS — N186 End stage renal disease: Secondary | ICD-10-CM

## 2023-09-18 DIAGNOSIS — Z79899 Other long term (current) drug therapy: Secondary | ICD-10-CM

## 2023-09-18 DIAGNOSIS — Z794 Long term (current) use of insulin: Secondary | ICD-10-CM

## 2023-09-18 DIAGNOSIS — Z87891 Personal history of nicotine dependence: Secondary | ICD-10-CM

## 2023-09-18 DIAGNOSIS — I350 Nonrheumatic aortic (valve) stenosis: Secondary | ICD-10-CM

## 2023-09-18 DIAGNOSIS — Z7982 Long term (current) use of aspirin: Secondary | ICD-10-CM

## 2023-09-18 DIAGNOSIS — I5040 Unspecified combined systolic (congestive) and diastolic (congestive) heart failure: Secondary | ICD-10-CM

## 2023-09-18 DIAGNOSIS — G9340 Encephalopathy, unspecified: Secondary | ICD-10-CM

## 2023-09-18 DIAGNOSIS — I429 Cardiomyopathy, unspecified: Secondary | ICD-10-CM

## 2023-09-18 DIAGNOSIS — Z952 Presence of prosthetic heart valve: Secondary | ICD-10-CM

## 2023-09-18 DIAGNOSIS — Z992 Dependence on renal dialysis: Secondary | ICD-10-CM

## 2023-09-18 LAB — CBC WITH DIFFERENTIAL/PLATELET
Abs Immature Granulocytes: 0.04 K/uL (ref 0.00–0.07)
Basophils Absolute: 0 K/uL (ref 0.0–0.1)
Basophils Relative: 0 %
Eosinophils Absolute: 0 K/uL (ref 0.0–0.5)
Eosinophils Relative: 0 %
HCT: 32.9 % — ABNORMAL LOW (ref 39.0–52.0)
Hemoglobin: 10.3 g/dL — ABNORMAL LOW (ref 13.0–17.0)
Immature Granulocytes: 0 %
Lymphocytes Relative: 6 %
Lymphs Abs: 0.6 K/uL — ABNORMAL LOW (ref 0.7–4.0)
MCH: 31.7 pg (ref 26.0–34.0)
MCHC: 31.3 g/dL (ref 30.0–36.0)
MCV: 101.2 fL — ABNORMAL HIGH (ref 80.0–100.0)
Monocytes Absolute: 0.5 K/uL (ref 0.1–1.0)
Monocytes Relative: 5 %
Neutro Abs: 8.3 K/uL — ABNORMAL HIGH (ref 1.7–7.7)
Neutrophils Relative %: 89 %
Platelets: 184 K/uL (ref 150–400)
RBC: 3.25 MIL/uL — ABNORMAL LOW (ref 4.22–5.81)
RDW: 15.2 % (ref 11.5–15.5)
WBC: 9.5 K/uL (ref 4.0–10.5)
nRBC: 0 % (ref 0.0–0.2)

## 2023-09-18 LAB — GLUCOSE, CAPILLARY
Glucose-Capillary: 30 mg/dL — CL (ref 70–99)
Glucose-Capillary: 32 mg/dL — CL (ref 70–99)
Glucose-Capillary: 93 mg/dL (ref 70–99)
Glucose-Capillary: 96 mg/dL (ref 70–99)
Glucose-Capillary: 115 mg/dL — ABNORMAL HIGH (ref 70–99)
Glucose-Capillary: 162 mg/dL — ABNORMAL HIGH (ref 70–99)
Glucose-Capillary: 165 mg/dL — ABNORMAL HIGH (ref 70–99)

## 2023-09-18 LAB — BASIC METABOLIC PANEL WITH GFR
Anion gap: 19 — ABNORMAL HIGH (ref 5–15)
BUN: 81 mg/dL — ABNORMAL HIGH (ref 8–23)
CO2: 24 mmol/L (ref 22–32)
Calcium: 8.3 mg/dL — ABNORMAL LOW (ref 8.9–10.3)
Chloride: 92 mmol/L — ABNORMAL LOW (ref 98–111)
Creatinine, Ser: 16.57 mg/dL — ABNORMAL HIGH (ref 0.61–1.24)
GFR, Estimated: 2 mL/min — ABNORMAL LOW (ref >60)
Glucose, Bld: 89 mg/dL (ref 70–99)
Potassium: 3.4 mmol/L — ABNORMAL LOW (ref 3.5–5.1)
Sodium: 135 mmol/L (ref 135–145)

## 2023-09-18 LAB — HEPATITIS B SURFACE ANTIBODY, QUANTITATIVE: Hep B S AB Quant (Post): 23 m[IU]/mL

## 2023-09-18 MED ORDER — HEPARIN SODIUM (PORCINE) 1000 UNIT/ML DIALYSIS: 1000.0000 [IU] | INTRAMUSCULAR | Status: DC | PRN

## 2023-09-18 MED ORDER — DEXTROSE 50 % IV SOLN
25.0000 g | INTRAVENOUS | Status: AC
  Administered 2023-09-18: 25 g via INTRAVENOUS
  Filled 2023-09-18: qty 50

## 2023-09-18 MED ORDER — ALTEPLASE 2 MG IJ SOLR: 2.0000 mg | Freq: Once | INTRAMUSCULAR | Status: DC | PRN

## 2023-09-18 MED ORDER — SEVELAMER CARBONATE 800 MG PO TABS
2400.0000 mg | ORAL_TABLET | Freq: Two times a day (BID) | ORAL | Status: DC
Start: 2023-09-18 — End: 2023-09-21
  Administered 2023-09-18 – 2023-09-21 (×5): 2400 mg via ORAL
  Filled 2023-09-18 (×6): qty 3

## 2023-09-18 MED ORDER — ONDANSETRON HCL 4 MG/2ML IJ SOLN
4.0000 mg | Freq: Once | INTRAMUSCULAR | Status: AC
  Administered 2023-09-18: 4 mg via INTRAVENOUS
  Filled 2023-09-18: qty 2

## 2023-09-18 NOTE — Consult Note (Addendum)
 Hospital Consult    Reason for Consult:  PD catheter removal Requesting Physician:  Nephrology MRN #:  [id number]  History of Present Illness: This is a 89 y.o. male with past medical history significant for severe aortic stenosis, CAD with history of CABG, dyslipidemia, hypertension, ESRD requiring dialysis, type 2 diabetes mellitus, and baseline dementia.  He is being seen in consultation due to request for removal of peritoneal dialysis.  Patient is seen during HD with no family present.  He is minimally interactive and able to answer some yes/no questions.  Based on chart review and discussion with nephrology, the PD catheter is no longer functioning appropriately.  He is transitioning back to hemodialysis via right IJ TDC.  There is mention of recent peritonitis however patient does not complain of pain in the abdomen currently.  He has been afebrile without a white count.  Workup also includes peritoneal fluid culture which was negative.  He is on Rocephin .  Past Medical History:  Diagnosis Date   Acute coronary syndrome (HCC) 04/18/2019   AMS (altered mental status) 03/07/2023   Aortic stenosis 02/27/2020   mild to moderate AS   Arthritis    a little in LLE (06/20/2017)   Ataxia    Chronic diastolic CHF (congestive heart failure) (HCC)    Coronary artery disease    a. s/p CABG 1980s. b. stable by cath 05/2017.   Dizziness    Dyslipidemia    Edema 08/08/2017   HANDS & LOWER EXTREMITES   ESRD on hemodialysis (HCC)    Hypertension    Hypoalbuminemia    Nephrotic range proteinuria    Peripheral artery disease (HCC)    mild   Pneumonia ~ 1950 X 1   Prostate cancer (HCC) ~ 2015   Type II diabetes mellitus (HCC)     Past Surgical History:  Procedure Laterality Date   CAPD REVISION N/A 06/29/2022   Procedure: REPLACEMENT OF PERITONEAL DIALYSIS CATHETER;  Surgeon: Magda Debby SAILOR, MD;  Location: [facility] OR;  Service: Vascular;  Laterality: N/A;   CARDIAC CATHETERIZATION     a  couple times (06/20/2017)   CATARACT EXTRACTION W/ INTRAOCULAR LENS  IMPLANT, BILATERAL Bilateral    COLONOSCOPY     CORONARY ARTERY BYPASS GRAFT  03/06/1985   LAD and first diagonal/circumflex (sequential saphenous vein graft,cardioplegia   INGUINAL HERNIA REPAIR Right 12/06/2020   Procedure: OPEN RIGHT INGUINAL HERNIA REPAIR;  Surgeon: Gladis Cough, MD;  Location: [facility];  Service: General;  Laterality: Right;   INSERTION PROSTATE RADIATION SEED  ~ 2015   IR FLUORO GUIDE CV LINE RIGHT  06/26/2022   IR US  GUIDE VASC ACCESS RIGHT  06/26/2022   LAPAROSCOPIC LYSIS OF ADHESIONS N/A 06/29/2022   Procedure: LAPAROSCOPIC LYSIS OF ADHESIONS;  Surgeon: Magda Debby SAILOR, MD;  Location: [facility] OR;  Service: Vascular;  Laterality: N/A;   LAPAROSCOPY N/A 06/29/2022   Procedure: LAPAROSCOPY DIAGNOSTIC;  Surgeon: Magda Debby SAILOR, MD;  Location: [facility] OR;  Service: Vascular;  Laterality: N/A;   LEFT HEART CATH AND CORS/GRAFTS ANGIOGRAPHY N/A 02/27/2023   Procedure: LEFT HEART CATH AND CORS/GRAFTS ANGIOGRAPHY;  Surgeon: Anner Alm ORN, MD;  Location: [facility];  Service: Cardiovascular;  Laterality: N/A;   NM MYOCAR PERF WALL MOTION  09/12/2006   no ischemia   REMOVAL OF A DIALYSIS CATHETER N/A 07/04/2022   Procedure: PERITONEAL CATHETER REMOVAL;  Surgeon: Lanis Fonda BRAVO, MD;  Location[facility];  Service: Vascular;  Laterality: N/A;   RIGHT/LEFT HEART CATH AND  CORONARY/GRAFT ANGIOGRAPHY N/A 06/21/2017   Procedure: RIGHT/LEFT HEART CATH AND CORONARY/GRAFT ANGIOGRAPHY;  Surgeon: Verlin Lonni BIRCH, MD;  Location: [facility];  Service: Cardiovascular;  Laterality: N/A;   US  ECHOCARDIOGRAPHY  11/04/2008   trace TR & MR    Allergies  Allergen Reactions   Clonidine  Hcl     Other reaction(s): constipation    Prior to Admission medications   Medication Sig Start Date End Date Taking? Authorizing Provider  aspirin  EC 81 MG tablet Take 81 mg by mouth daily.   Yes [provider]   atorvastatin  (LIPITOR ) 80 MG tablet TAKE 1 TABLET BY MOUTH EVERY DAY 09/04/23  Yes Hilty, Vinie BROCKS, MD  carvedilol  (COREG ) 3.125 MG tablet TAKE 1 TABLET BY MOUTH TWICE A DAY WITH FOOD 08/29/23  Yes Hilty, Vinie BROCKS, MD  famotidine  (PEPCID ) 10 MG tablet Take 10 mg by mouth daily as needed for heartburn or indigestion.   Yes [provider]  folic acid  (FOLVITE ) 400 MCG tablet Take 400 mcg by mouth daily.   Yes [provider]  furosemide  (LASIX ) 80 MG tablet TAKE 1 TABLET (80 MG TOTAL) BY MOUTH 2 (TWO) TIMES DAILY AT 10 AM AND 5 PM. 04/23/23  Yes Hilty, Vinie BROCKS, MD  insulin  glargine (LANTUS ) 100 UNIT/ML injection Inject 10 Units into the skin daily as needed (high blood glucose). 10/29/19  Yes [provider]  isosorbide  mononitrate (IMDUR ) 30 MG 24 hr tablet Take 1 tablet on day you do not have dialysis 04/02/23  Yes West, Katlyn D, NP  lactulose , encephalopathy, (CHRONULAC ) 10 GM/15ML SOLN Take 30 mLs by mouth daily as needed. 07/04/23  Yes [provider]  linaclotide  (LINZESS ) 145 MCG CAPS capsule Take 1 capsule (145 mcg total) by mouth daily before breakfast. 08/22/23 08/16/24 Yes Craig Palma R, PA-C  melatonin 1 MG TABS tablet Take 1 mg by mouth at bedtime as needed (Sleep).   Yes [provider]  mirtazapine (REMERON) 7.5 MG tablet Take 7.5 mg by mouth at bedtime. 06/20/23  Yes [provider]  omeprazole  (PRILOSEC) 40 MG capsule Open capsule and sprinkle on food twice daily 08/22/23  Yes Craig Palma SAUNDERS, PA-C  SENSIPAR 60 MG tablet Take 1 tablet by mouth daily. 08/14/23  Yes [provider]  sevelamer  carbonate (RENVELA ) 800 MG tablet Take 2,400 mg by mouth 2 (two) times daily. 11/09/21  Yes [provider]  zolpidem (AMBIEN) 10 MG tablet Take 10 mg by mouth at bedtime as needed for sleep. 04/30/23  Yes [provider]  diphenhydramine-acetaminophen  (TYLENOL  PM) 25-500 MG TABS tablet Take 1 tablet by mouth at bedtime as  needed (Sleep).    [provider]  Nutritional Supplements (,FEEDING SUPPLEMENT, PROSOURCE PLUS) liquid Take 30 mLs by mouth 2 (two) times daily between meals. Patient taking differently: Take 30 mLs by mouth every other day. premier protein 07/04/22   Tobie Yetta HERO, MD    Social History   Socioeconomic History   Marital status: Married    Spouse name: [name]   Number of children: Not on file   Years of education: Not on file   Highest education level: Not on file  Occupational History   Occupation: Retired  Tobacco Use   Smoking status: Former    Current packs/day: 0.00    Average packs/day: 1 pack/day for 20.0 years (20.0 ttl pk-yrs)    Types: Cigarettes    Start date: 08/22/1952    Quit date: 08/22/1972    Years since  quitting: 51.1   Smokeless tobacco: Never  Vaping Use   Vaping status: Never Used  Substance and Sexual Activity   Alcohol  use: Not Currently    Comment: 06/20/2017 nothing since ~ 1974   Drug use: No   Sexual activity: Not Currently  Other Topics Concern   Not on file  Social History Narrative   Lives with wife   Right handed   Caffeine- 1 cup some days    Social Drivers of Corporate investment banker Strain: Not on file  Food Insecurity: Unknown (09/15/2023)   Hunger Vital Sign    Worried About Running Out of Food in the Last Year: Never true    Ran Out of Food in the Last Year: Not on file  Transportation Needs: Patient Unable To Answer (09/18/2023)   PRAPARE - Transportation    Lack of Transportation (Medical): Patient unable to answer    Lack of Transportation (Non-Medical): Patient unable to answer  Physical Activity: Sufficiently Active (10/31/2019)   Exercise Vital Sign    Days of Exercise per Week: 7 days    Minutes of Exercise per Session: 40 min  Stress: No Stress Concern Present (06/29/2023)   Received from [facility] Occup[facility] - Occupational Stress Questionnaire    Feeling of Stress : Not at  all  Social Connections: Patient Unable To Answer (09/18/2023)   Social Connection and Isolation Panel    Frequency of Communication with Friends and Family: Patient unable to answer    Frequency of Social Gatherings with Friends and Family: Patient unable to answer    Attends Religious Services: Patient unable to answer    Active Member of Clubs or Organizations: Patient unable to answer    Attends Banker Meetings: Patient unable to answer    Marital Status: Patient unable to answer  Intimate Partner Violence: Patient Unable To Answer (09/18/2023)   Humiliation, Afraid, Rape, and Kick questionnaire    Fear of Current or Ex-Partner: Patient unable to answer    Emotionally Abused: Patient unable to answer    Physically Abused: Patient unable to answer    Sexually Abused: Patient unable to answer     Family History  Problem Relation Age of Onset   Heart attack Mother     ROS: Otherwise negative unless mentioned in HPI  Physical Examination  Vitals:   09/18/23 1101 09/18/23 1131  BP: 115/63 107/83  Pulse: 79   Resp: 18   Temp:    SpO2: 100%    Body mass index is 19.59 kg/m.  General:  elderly Gait: Not observed HENT: WNL, normocephalic Pulmonary: normal non-labored breathing Cardiac: regular Abdomen:  soft, NT/ND, no masses Skin: without rashes Extremities: without ischemic changes, without Gangrene , without cellulitis; without open wounds;  Musculoskeletal: no muscle wasting or atrophy  Neurologic: answering some yes, no questions but otherwise not conversing or interacting Psychiatric:  The pt has Normal affect. Lymph:  Unremarkable  CBC    Component Value Date/Time   WBC 9.5 09/18/2023 0949   RBC 3.25 (L) 09/18/2023 0949   HGB 10.3 (L) 09/18/2023 0949   HGB 10.6 (L) 02/16/2023 1433   HCT 32.9 (L) 09/18/2023 0949   HCT 31.6 (L) 02/16/2023 1433   PLT 184 09/18/2023 0949   PLT 253 02/16/2023 1433   MCV 101.2 (H) 09/18/2023 0949   MCV 98 (H)  02/16/2023 1433   MCH 31.7 09/18/2023 0949   MCHC 31.3 09/18/2023 0949   RDW  15.2 09/18/2023 0949   RDW 14.7 02/16/2023 1433   LYMPHSABS 0.6 (L) 09/18/2023 0949   MONOABS 0.5 09/18/2023 0949   EOSABS 0.0 09/18/2023 0949   BASOSABS 0.0 09/18/2023 0949    BMET    Component Value Date/Time   NA 135 09/18/2023 0834   NA 140 02/16/2023 1433   K 3.4 (L) 09/18/2023 0834   CL 92 (L) 09/18/2023 0834   CO2 24 09/18/2023 0834   GLUCOSE 89 09/18/2023 0834   BUN 81 (H) 09/18/2023 0834   BUN 39 (H) 02/16/2023 1433   CREATININE 16.57 (H) 09/18/2023 0834   CALCIUM  8.3 (L) 09/18/2023 0834   GFRNONAA 2 (L) 09/18/2023 0834   GFRAA 16 (L) 04/25/2019 0441    COAGS: Lab Results  Component Value Date   INR 1.2 03/08/2023   INR 1.2 03/07/2023   INR 1.1 04/19/2019      ASSESSMENT/PLAN: This is a 89 y.o. male with ESRD.  He is being seen in consultation due to request for removal of peritoneal dialysis catheter due to dysfunction and being transitioned to hemodialysis  Mr. Greenman is an 89 year old male with numerous comorbidities including severe aortic stenosis, CAD with history of CABG, type 2 diabetes, ESRD requiring dialysis, and baseline dementia.  Per nephrology he is being transition back to intermittent hemodialysis from peritoneal dialysis.  The PD catheter is no longer functioning appropriately.  There was concern for infection of PD catheter however patient remains afebrile with a normal white count.  Peritoneal fluid culture also negative.  On exam his abdomen is soft and he does not have any pain to palpation.  Given his comorbidities and severe aortic stenosis, CAD he was deemed a poor candidate for heart catheterization as well as TAVR by cardiology.  There is no absolute indication for PD catheter removal at this time given the risk of a general anesthetic.  If PD catheter is believed to be infected, we would proceed with removal.  On-call vascular surgeon Dr. Pearline will evaluate  the patient later today and provide further treatment plans.   Donnice Sender PA-C [facility] [phone]   VASCULAR STAFF ADDENDUM: I have independently interviewed and examined the patient. I agree with the above.  89 year old male has been admitted with encephalopathy superimposed over baseline dementia.  He is ESRD on peritoneal dialysis but has been recently transition to hemodialysis.  We have been consulted for PD catheter removal.  There is no erythema, induration or drainage from the catheter exit site.  He denies any abdominal pain.  He is nontender on exam.    Given that he has multiple comorbidities most notable severe aortic stenosis and has been deemed a poor candidate for heart catheterization as well as TAVR I do not think that elective PD catheter removal is indicated at this time.  There are no signs of infection or peritonitis and the peritoneal fluid labs came back as sterile and without significant PMNs. He would be very high risk for general anesthesia and without an absolute indication for catheter removal I think it is best to keep the catheter in place and continue normal catheter care.  Norman GORMAN Pearline MD [facility] Phone Number: [phone] 09/18/2023 3:16 PM

## 2023-09-18 NOTE — Progress Notes (Signed)
   09/18/23 1301  Vitals  Temp 98 F (36.7 C)  Resp 17  BP 117/67  SpO2 100 %  Weight 63.7 kg  Post Treatment  Dialyzer Clearance Clear  Hemodialysis Intake (mL) 0 mL  Liters Processed 81.5  Fluid Removed (mL) 0 mL  Tolerated HD Treatment Yes  Post-Hemodialysis Comments no issues noted   Pt tolerated tx well, no issues during treatment, no /co pain or discomfort, cath hep locked, bedside report given to RN

## 2023-09-18 NOTE — Code Documentation (Signed)
 [name] is an 89 yr old male admitted for AMS. Code stroke activated by bedside staff this morning when Nurses were called in to check the patient by Telemetry for bradycardia. Pt was found to be less responsive than prior, not talking or following commands, so code stroke was activated. BP and CBG checked, and were within normal limits. LKW was 11 PM yesterday.  Code stroke team to pt's bedside. With noxious stimulus, pt will awaken and talk, as well as follow commands. No focal abnormality seen. NIHSS 2 for drowsiness and disorientation to month.   Code stroke cancelled by Dr. Lindzen, as pt appears back to his baseline.   Richardson Said RN Stroke Response [phone]

## 2023-09-18 NOTE — Progress Notes (Signed)
 Rounding Note    Patient Name: [name] Date of Encounter: 09/18/2023  [facility] Cardiologist: Vinie JAYSON Maxcy, MD   Subjective   Code stroke called this AM for altered mental status, then cancelled. CBG was 32 and 30, improved with treatment.   Inpatient Medications    Scheduled Meds:  (feeding supplement) PROSource Plus  30 mL Oral QODAY   aspirin  EC  81 mg Oral Daily   atorvastatin   80 mg Oral Daily   carvedilol   3.125 mg Oral BID WC   Chlorhexidine  Gluconate Cloth  6 each Topical Q0600   folic acid   0.5 mg Oral Daily   gentamicin  cream  1 Application Topical Daily   heparin   5,000 Units Subcutaneous Q8H   insulin  aspart  0-5 Units Subcutaneous QHS   insulin  aspart  0-6 Units Subcutaneous TID WC   linaclotide   145 mcg Oral QAC breakfast   pantoprazole   40 mg Oral Daily   sevelamer  carbonate  2,400 mg Oral BID   sodium chloride  flush  3 mL Intravenous Q12H   Continuous Infusions:  cefTRIAXone  (ROCEPHIN )  IV 2 g (09/18/23 0604)   dialysis solution 1.5% low-MG/low-CA     dialysis solution 2.5% low-MG/low-CA     fluconazole  (DIFLUCAN ) IV 100 mg (09/18/23 0303)   PRN Meds: acetaminophen  **OR** acetaminophen , alteplase , famotidine , heparin , melatonin, polyethylene glycol   Vital Signs    Vitals:   09/18/23 0934 09/18/23 1000 09/18/23 1030 09/18/23 1101  BP:  106/61 118/68 115/63  Pulse:  69 83 79  Resp: 12 11 18 18   Temp: 97.9 F (36.6 C)     TempSrc:      SpO2: 99% 99% 100% 100%  Weight: 63.7 kg     Height:        Intake/Output Summary (Last 24 hours) at 09/18/2023 1110 Last data filed at 09/17/2023 1730 Gross per 24 hour  Intake 240 ml  Output 63 ml  Net 177 ml      09/18/2023    9:34 AM 09/18/2023    9:25 AM 09/18/2023    5:00 AM  Last 3 Weights  Weight (lbs) 140 lb 6.9 oz 140 lb 6.9 oz 139 lb 15.9 oz  Weight (kg) 63.7 kg 63.7 kg 63.5 kg      Telemetry    Not active on telemetry - Personally Reviewed  Physical Exam   GEN:  Frail, thin, chronically ill appearing elderly gentleman in no acute distress Neck: No JVD Cardiac: RRR, 3/6 systolic ejection murmur Respiratory: Clear to auscultation bilaterally. GI: Soft, nontender, non-distended  MS: No edema; No deformity. Neuro:  Nonfocal  Psych: Normal affect   New pertinent results (labs, ECG, imaging, cardiac studies)    Echo images from this admission personally reviewed, as well as prior echoes and cardiac PET study  Assessment & Plan    Cardiomyopathy, ischemic Chronic systolic and diastolic heart failure CAD with prior CABG in 1987 Elevated troponins ESRD on PD -EF mildly reduced at 40-45% -wall motion abnormalities seen on echo this admission are not new, seen on prior cardiac PET, and cath after PET shows occlusion of Lcx with no graft flow. No good options for revascularization, recommended for medical management -given the severity of known CAD and the clinical presentation on arrival, elevated troponins are not unexpected and are consistent with demand ischemia. He and wife both deny any chest pain -with ESRD, avoiding ACEi/ARB/ARNI/MRA -on low dose carvedilol  -tolerating aspirin  and atorvastatin  -volume management through PD, per nephrology  notes planned to return to IHD -was on imdur  on non dialysis days prior to admission. With intermittent low blood pressure here, and no chest pain, will not restart at this time  Low flow low gradient severe AS -patient difficult to assess for symptoms, but wife denies any comments about chest pain, shortness of breath. No syncope. See my extensive conversation with wife re: both his CAD and aortic stenosis from 7/21. I do not think he is a redo sternotomy candidate at all, and even TAVR has risk. With his overall frailty, ESRD, I am not sure he would be a candidate for TAVR. He is asymptomatic currently, so this is not urgent. If he has a remarkable recovery as an outpatient, he could re-discuss aortic stenosis  with Dr. Mona  Altered mental status Poor oral intake Nausea, vomiting, diarrhea -management per primary team  [facility] will sign off.   Medication Recommendations:  continue aspirin , atorvastatin , carvedilol  as ordered. Blood pressure has been intermittently marginal; as he has had no chest pain, would not restart prior to admission imdur  at this time.  Other recommendations (labs, testing, etc):  none Follow up as an outpatient:  We have arranged for post hospital follow up with Josefa Beauvais, NP on 10/09/23 at 1:55 PM and then with Dr. Mona on 12/05/23 at  9:20 AM, both at [facility] office     Signed, Shelda Bruckner, MD  09/18/2023, 11:10 AM

## 2023-09-18 NOTE — Progress Notes (Addendum)
 [facility] PROGRESS NOTE    Progress Note  [name]  FMW:[id number] DOB: 1933-04-09 DOA: 09/15/2023 PCP: Corlis Pagan, NP     Brief Narrative:   [name] is an 89 y.o. male past medical history of essential hypertension end-stage renal disease on peritoneal dialysis, baseline dementia CAD status post CABG moderate aortic stenosis chronic diastolic dysfunction presents with confusion, family relates he had a decreased oral intake for the past 4 days.  2 days prior to admission started having nausea vomiting.  He describes the food feels like it stopping and bothers him as he swallows.   Assessment/Plan:   Acute metabolic encephalopathy superimposed with baseline dementia: -Unclear etiology.  Now resolved. - Has remained afebrile with no leukocytosis chest x-ray appears to be clean.   - CT of the head showed no acute findings. - He does have a history of recent peritonitis, only rocephin , awaiting peritoneal fluid. - Peritoneal fluid sent for sampling for culture which is pending and cell count showed 5 white blood cells. Procalcitonin is low yield.    GI symptoms nausea and diarrhea: - Started on PPI and Zofran .  This has resolved now. - He has been complaining of some dysphagia continue IV Diflucan .  He relates his speech is resolved.  Elevated troponins/severe aortic stenosis: - Elevated troponins trending down, 2D echo was done that showed an EF of 45% with new left ventricular wall motion abnormality, Aortic valve was severely thickening with severe aortic stenosis with a gradient of 31 mmHg - Consult cardiology no ischemic evaluation as for his aortic stenosis he is not a good candidate for TAVR's or good candidate for surgical intervention due to his multiple comorbidities. - Continue aspirin  statins and beta-blockers and conservative management  PAD/CAD: - With a CABG 37 years ago.  He denies any chest pain or shortness of breath. - Left heart cath on  December 2024 showed mild to moderate disease, continue aspirin  and statins.  End-stage renal disease on peritoneal dialysis: - Nephrology was consulted continue PD.  As per wife he has continue peritoneal dialysis every night.  Did not completed on the night of admission due to confusion. - Peritoneal fluid sent for sampling but he has remained afebrile. -Procalcitonin is low yield. - Peritoneal catheter stoma is functioning, so nephrology will transition to HD on 09/18/2023. - Vascular consulted for peritoneal catheter removal.  Diabetes mellitus type 2hypoglycemia: - Glucose low this morning discontinue long-acting insulin  continue sliding scale.  Chronic diastolic dysfunction: With an EF of 65% normal RV. Holding Lasix  for now.  Essential hypertension Continue Coreg  blood pressure is stable.  DNR (do not resuscitate) Confirmed with wife.   DVT prophylaxis: lovenox Family Communication:none Status is: Observation The patient remains OBS appropriate and will d/c before 2 midnights.    Code Status:     Code Status Orders  (From admission, onward)           Start     Ordered   09/15/23 1556  Do not attempt resuscitation (DNR) Pre-Arrest Interventions Desired  Continuous       Question Answer Comment  If pulseless and not breathing No CPR or chest compressions.   In Pre-Arrest Conditions (Patient Has Pulse and Is Breathing) May intubate, use advanced airway interventions and cardioversion/ACLS medications if appropriate or indicated. May transfer to ICU.   Consent: Discussion documented in EHR or advanced directives reviewed      09/15/23 1612  Code Status History     Date Active Date Inactive Code Status Order ID Comments User Context   03/07/2023 1342 03/10/2023 2128 Do not attempt resuscitation (DNR) PRE-ARREST INTERVENTIONS DESIRED [id number]  Moody Alto, MD ED   02/27/2023 1253 02/27/2023 2156 Full Code [id number]  Anner Alm ORN, MD Inpatient    06/22/2022 2016 07/04/2022 2151 DNR [id number]  Laurence Locus, DO ED   06/22/2022 1948 06/22/2022 2016 DNR [id number]  Laurence Locus, DO ED   04/18/2019 1922 04/25/2019 1924 Full Code [id number]  Claudene Pacific, MD ED   08/08/2017 1717 08/15/2017 1904 Full Code [id number]  Abigail Raphael SAILOR, PA-C Inpatient   06/20/2017 1935 06/21/2017 2037 Full Code [id number]  Dock Bruno HERO., PA-C Inpatient         IV Access:   Peripheral IV   Procedures and diagnostic studies:   ECHOCARDIOGRAM COMPLETE Result Date: 09/16/2023    ECHOCARDIOGRAM REPORT   Patient Name:   [name] Date of Exam: 09/16/2023 Medical Rec #:  [id number]       Height:       71.0 in Accession #:    [id number]      Weight:       158.1 lb Date of Birth:  [birth year]        BSA:          1.908 m Patient Age:    89 years        BP:           138/63 mmHg Patient Gender: M               HR:           74 bpm. Exam Location:  Inpatient Procedure: 2D Echo, 3D Echo, Cardiac Doppler and Color Doppler (Both Spectral            and Color Flow Doppler were utilized during procedure). Indications:    Elevated Troponin  History:        Patient has prior history of Echocardiogram examinations, most                 recent 10/06/2022. CHF, CAD, Prior CABG, PAD; Risk                 Factors:Hypertension, Diabetes, Dyslipidemia and Former Smoker.  Sonographer:    Koleen Popper RDCS Referring Phys: [id number] SUBRINA SUNDIL IMPRESSIONS  1. Left ventricular ejection fraction, by estimation, is 40 to 45%. The left ventricle has mildly decreased function. The left ventricle demonstrates regional wall motion abnormalities (see scoring diagram/findings for description). Left ventricular diastolic parameters are consistent with Grade II diastolic dysfunction (pseudonormalization). Elevated left atrial pressure. There is severe hypokinesis of the left ventricular, entire inferolateral wall. There is mild hypokinesis of the left ventricular, basal-mid lateral wall and inferior wall.  2. Right  ventricular systolic function is mildly reduced. The right ventricular size is normal. There is normal pulmonary artery systolic pressure. The estimated right ventricular systolic pressure is 20.1 mmHg.  3. Left atrial size was moderately dilated.  4. The mitral valve is degenerative. Mild to moderate mitral valve regurgitation. No evidence of mitral stenosis. Moderate mitral annular calcification.  5. The aortic valve is tricuspid. There is severe calcifcation of the aortic valve. There is severe thickening of the aortic valve. Aortic valve regurgitation is trivial. Severe aortic valve stenosis. Aortic valve mean gradient measures 31.0 mmHg. Aortic valve Vmax measures 3.68 m/s. Comparison(s): The left ventricular function is worsened. The left ventricular wall motion  abnormalities are new. Aortic stenosis is worse and meets criteria for low flow low gradient severe aortic stenosis. FINDINGS  Left Ventricle: Left ventricular ejection fraction, by estimation, is 40 to 45%. The left ventricle has mildly decreased function. The left ventricle demonstrates regional wall motion abnormalities. Severe hypokinesis of the left ventricular, entire inferolateral wall. Mild hypokinesis of the left ventricular, basal-mid lateral wall and inferior wall. 3D ejection fraction reviewed and evaluated as part of the interpretation. Alternate measurement of EF is felt to be most reflective of LV function. The left ventricular internal cavity size was normal in size. There is no left ventricular hypertrophy. Abnormal (paradoxical) septal motion consistent with post-operative status. Left ventricular diastolic parameters are consistent with Grade II diastolic dysfunction (pseudonormalization). Elevated left atrial pressure.  LV Wall Scoring: The antero-lateral wall, posterior wall, and basal inferior segment are hypokinetic. The entire anterior wall, entire septum, entire apex, and mid and distal inferior wall are normal. Right  Ventricle: The right ventricular size is normal. No increase in right ventricular wall thickness. Right ventricular systolic function is mildly reduced. There is normal pulmonary artery systolic pressure. The tricuspid regurgitant velocity is 2.07 m/s, and with an assumed right atrial pressure of 3 mmHg, the estimated right ventricular systolic pressure is 20.1 mmHg. Left Atrium: Left atrial size was moderately dilated. Right Atrium: Right atrial size was normal in size. Pericardium: There is no evidence of pericardial effusion. Mitral Valve: The mitral valve is degenerative in appearance. Moderate mitral annular calcification. Mild to moderate mitral valve regurgitation, with centrally-directed jet. No evidence of mitral valve stenosis. Tricuspid Valve: The tricuspid valve is normal in structure. Tricuspid valve regurgitation is trivial. Aortic Valve: The aortic valve is tricuspid. There is severe calcifcation of the aortic valve. There is severe thickening of the aortic valve. Aortic valve regurgitation is trivial. Severe aortic stenosis is present. Aortic valve mean gradient measures 31.0 mmHg. Aortic valve peak gradient measures 54.0 mmHg. Aortic valve area, by VTI measures 0.73 cm. Pulmonic Valve: The pulmonic valve was not well visualized. Pulmonic valve regurgitation is not visualized. No evidence of pulmonic stenosis. Aorta: The aortic root is normal in size and structure. IAS/Shunts: No atrial level shunt detected by color flow Doppler. Additional Comments: 3D was performed not requiring image post processing on an independent workstation and was abnormal.  LEFT VENTRICLE PLAX 2D LVIDd:         4.70 cm   Diastology LVIDs:         4.40 cm   LV e' medial:    3.65 cm/s LV PW:         1.10 cm   LV E/e' medial:  27.9 LV IVS:        1.10 cm   LV e' lateral:   6.00 cm/s LVOT diam:     1.94 cm   LV E/e' lateral: 17.0 LV SV:         57 LV SV Index:   30 LVOT Area:     2.96 cm                           3D Volume  EF:                          3D EF:        32 %  LV EDV:       136 ml                          LV ESV:       93 ml                          LV SV:        43 ml RIGHT VENTRICLE            IVC RV Basal diam:  3.80 cm    IVC diam: 1.30 cm RV S prime:     8.60 cm/s TAPSE (M-mode): 2.0 cm LEFT ATRIUM             Index        RIGHT ATRIUM           Index LA diam:        4.80 cm 2.52 cm/m   RA Area:     12.70 cm LA Vol (A2C):   50.8 ml 26.63 ml/m  RA Volume:   30.10 ml  15.78 ml/m LA Vol (A4C):   54.9 ml 28.77 ml/m LA Biplane Vol: 55.1 ml 28.88 ml/m  AORTIC VALVE AV Area (Vmax):    0.72 cm AV Area (Vmean):   0.66 cm AV Area (VTI):     0.73 cm AV Vmax:           367.50 cm/s AV Vmean:          260.500 cm/s AV VTI:            0.783 m AV Peak Grad:      54.0 mmHg AV Mean Grad:      31.0 mmHg LVOT Vmax:         89.10 cm/s LVOT Vmean:        58.200 cm/s LVOT VTI:          0.194 m LVOT/AV VTI ratio: 0.25  AORTA Ao Root diam: 3.40 cm Ao Asc diam:  2.90 cm MITRAL VALVE                TRICUSPID VALVE MV Area (PHT): 3.31 cm     TR Peak grad:   17.1 mmHg MV Decel Time: 229 msec     TR Vmax:        207.00 cm/s MV E velocity: 102.00 cm/s MV A velocity: 134.00 cm/s  SHUNTS MV E/A ratio:  0.76         Systemic VTI:  0.19 m                             Systemic Diam: 1.94 cm Jerel Croitoru MD Electronically signed by Jerel Balding MD Signature Date/Time: 09/16/2023/2:05:05 PM    Final    DG Abd 1 View Result Date: 09/16/2023 CLINICAL DATA:  Peritoneal dialysis catheter dysfunction EXAM: ABDOMEN - 1 VIEW COMPARISON:  06/24/2018 FINDINGS: Peritoneal dialysis catheter is unchanged, coiled over the mid pelvis. Normal abdominal gas pattern. No organomegaly. Advanced vascular calcifications. Brachytherapy seeds overlie the prostate gland. Advanced degenerative changes noted within the lumbar spine. IMPRESSION: 1. Peritoneal dialysis catheter coiled over the mid pelvis. Electronically Signed   By: Dorethia Molt M.D.   On: 09/16/2023 10:46     Medical Consultants:   None.   Subjective:    Orvan LITTIE Essex relates his dysphagia is improved.  Objective:  Vitals:   09/18/23 0600 09/18/23 0714 09/18/23 0752 09/18/23 0755  BP: (!) 157/95 (!) 127/57 129/65 (!) 159/65  Pulse: 86 74 71 75  Resp:  17 16   Temp:  97.7 F (36.5 C)    TempSrc:  Axillary    SpO2: 98% 96% 100%   Weight:      Height:       SpO2: 100 %   Intake/Output Summary (Last 24 hours) at 09/18/2023 0931 Last data filed at 09/17/2023 1730 Gross per 24 hour  Intake 240 ml  Output 63 ml  Net 177 ml   Filed Weights   09/15/23 1135 09/17/23 0500 09/18/23 0500  Weight: 71.7 kg 57.4 kg 63.5 kg    Exam: General exam: In no acute distress. Respiratory system: Good air movement and clear to auscultation. Cardiovascular system: S1 & S2 heard, RRR. No JVD. Gastrointestinal system: Abdomen is nondistended, soft and nontender.  Extremities: No pedal edema. Skin: No rashes, lesions or ulcers Psychiatry: Judgement and insight appear normal. Mood & affect appropriate.  Data Reviewed:    Labs: Basic Metabolic Panel: Recent Labs  Lab 09/15/23 1142 09/17/23 0632  NA 142 140  K 3.3* 3.2*  CL 96* 95*  CO2 26 26  GLUCOSE 256* 191*  BUN 81* 83*  CREATININE 17.05* 16.33*  CALCIUM  8.2* 8.0*   GFR Estimated Creatinine Clearance: 2.8 mL/min (A) (by C-G formula based on SCr of 16.33 mg/dL (H)). Liver Function Tests: Recent Labs  Lab 09/15/23 1142 09/17/23 0632  AST 24 12*  ALT <5 <5  ALKPHOS 53 49  BILITOT 0.6 0.5  PROT 5.1* 4.9*  ALBUMIN 1.9* 1.7*   No results for input(s): LIPASE, AMYLASE in the last 168 hours. No results for input(s): AMMONIA in the last 168 hours. Coagulation profile No results for input(s): INR, PROTIME in the last 168 hours. COVID-19 Labs  No results for input(s): DDIMER, FERRITIN, LDH, CRP in the last 72 hours.  Lab Results  Component Value Date    SARSCOV2NAA NEGATIVE 03/07/2023   SARSCOV2NAA NEGATIVE 04/18/2019    CBC: Recent Labs  Lab 09/15/23 1142 09/16/23 0637 09/17/23 0632  WBC 10.4 8.3 9.0  NEUTROABS 8.7* 6.5  --   HGB 9.6* 9.8* 9.8*  HCT 30.1* 30.5* 30.4*  MCV 103.1* 100.7* 100.7*  PLT 173 165 154   Cardiac Enzymes: No results for input(s): CKTOTAL, CKMB, CKMBINDEX, TROPONINI in the last 168 hours. BNP (last 3 results) No results for input(s): PROBNP in the last 8760 hours. CBG: Recent Labs  Lab 09/17/23 2128 09/18/23 0556 09/18/23 0640 09/18/23 0709 09/18/23 0754  GLUCAP 113* 30* 32* 93 96   D-Dimer: No results for input(s): DDIMER in the last 72 hours. Hgb A1c: Recent Labs    09/16/23 0637  HGBA1C 8.6*   Lipid Profile: No results for input(s): CHOL, HDL, LDLCALC, TRIG, CHOLHDL, LDLDIRECT in the last 72 hours. Thyroid  function studies: No results for input(s): TSH, T4TOTAL, T3FREE, THYROIDAB in the last 72 hours.  Invalid input(s): FREET3 Anemia work up: No results for input(s): VITAMINB12, FOLATE, FERRITIN, TIBC, IRON , RETICCTPCT in the last 72 hours. Sepsis Labs: Recent Labs  Lab 09/15/23 1142 09/16/23 0637 09/17/23 9367  PROCALCITON  --  0.69  --   WBC 10.4 8.3 9.0   Microbiology Recent Results (from the past 240 hours)  Body fluid culture w Gram Stain     Status: None (Preliminary result)   Collection Time: 09/15/23  3:40 PM   Specimen: Peritoneal Washings; Body Fluid  Result Value Ref Range Status   Specimen Description PERITONEAL  Final   Special Requests NONE  Final   Gram Stain   Final    WBC PRESENT, PREDOMINANTLY MONONUCLEAR NO ORGANISMS SEEN CYTOSPIN SMEAR Performed at [facility], [address] [place], [place] [address]    Culture PENDING  Incomplete   Report Status PENDING  Incomplete     Medications:    (feeding supplement) PROSource Plus  30 mL Oral QODAY   aspirin  EC  81 mg Oral Daily   atorvastatin   80  mg Oral Daily   carvedilol   3.125 mg Oral BID WC   Chlorhexidine  Gluconate Cloth  6 each Topical Q0600   dextrose   25 g Intravenous STAT   dextrose   25 g Intravenous STAT   folic acid   0.5 mg Oral Daily   gentamicin  cream  1 Application Topical Daily   heparin   5,000 Units Subcutaneous Q8H   insulin  aspart  0-5 Units Subcutaneous QHS   insulin  aspart  0-6 Units Subcutaneous TID WC   insulin  aspart  2 Units Subcutaneous TID WC   insulin  glargine-yfgn  8 Units Subcutaneous Daily   linaclotide   145 mcg Oral QAC breakfast   pantoprazole   40 mg Oral Daily   sevelamer  carbonate  2,400 mg Oral BID   sodium chloride  flush  3 mL Intravenous Q12H   Continuous Infusions:  cefTRIAXone  (ROCEPHIN )  IV 2 g (09/18/23 0604)   dialysis solution 1.5% low-MG/low-CA     dialysis solution 2.5% low-MG/low-CA     fluconazole  (DIFLUCAN ) IV 100 mg (09/18/23 0303)      LOS: 2 days   Erle Odell Castor  [facility]  09/18/2023, 9:31 AM

## 2023-09-18 NOTE — Inpatient Diabetes Management (Signed)
 Inpatient Diabetes Program Recommendations  AACE/ADA: New Consensus Statement on Inpatient Glycemic Control (2015)  Target Ranges:  Prepandial:   less than 140 mg/dL      Peak postprandial:   less than 180 mg/dL (1-2 hours)      Critically ill patients:  140 - 180 mg/dL   Lab Results  Component Value Date   GLUCAP 96 09/18/2023   HGBA1C 8.6 (H) 09/16/2023    Review of Glycemic Control  Latest Reference Range & Units 09/17/23 16:09 09/17/23 21:28 09/18/23 05:56 09/18/23 06:40 09/18/23 07:09  Glucose-Capillary 70 - 99 mg/dL 803 (H) 886 (H) 30 (LL) 32 (LL) 93  (LL): Data is critically low (H): Data is abnormally high Diabetes history: Type 2 DM Outpatient Diabetes medications: Lantus  10 units QD Current orders for Inpatient glycemic control: Novolog  2 units TID, Novolog  0-6 units TID & HS   Inpatient Diabetes Program Recommendations:     Noted hypoglycemia this AM and insulin  adjustments. Of note, patient continues to receive meal coverage but meal consumption was documented < 50%.  With ESRD, consider: -Discontinue Novolog  2 units TID   Secure chat sent to MD.  Thanks, Tinnie Minus, MSN, RNC-OB Diabetes Coordinator [phone] (8a-5p)

## 2023-09-18 NOTE — Progress Notes (Signed)
 Hypoglycemic Event  CBG: 32  Treatment: D50 25 mL (12.5 gm)  Symptoms: None  Follow-up CBG: Time:0709 CBG Result:93  Possible Reasons for Event: Unknown  Comments/MD notified:James Blondie Avelina JONELLE Ricci

## 2023-09-18 NOTE — Progress Notes (Addendum)
 [facility] Progress Note   Subjective:    Noted code stroke was called this earlier this morning d/t an acute mental status change and bradycardia. Appears he returned back to baseline and code stroke was then cancelled. Seen and examined patient on the HD unit. Now transitioned back to IHD 2nd ongoing PD catheter malfunction and overall medical decline. No UF has been ordered. Consulted VVS this morning for PD catheter removal. Noted Cardiology consulted yesterday for elevated troponins and abnormality on recent ECHO.  Objective Vitals:   09/18/23 0934 09/18/23 1000 09/18/23 1030 09/18/23 1101  BP:  106/61 118/68 115/63  Pulse:  69 83 79  Resp: 12 11 18 18   Temp: 97.9 F (36.6 C)     TempSrc:      SpO2: 99% 99% 100% 100%  Weight: 63.7 kg     Height:       Physical Exam General:chronically ill appearing, frail male in NAD Heart: RRR, +3/6 systolic murmur Lungs:CTAB, nml WOB on RA Abdomen:soft, NTND Extremities:no LE edema Dialysis Access: PD cath (malfunctioned), [facility]   Filed Weights   09/18/23 0500 09/18/23 0925 09/18/23 0934  Weight: 63.5 kg 63.7 kg 63.7 kg    Intake/Output Summary (Last 24 hours) at 09/18/2023 1115 Last data filed at 09/17/2023 1730 Gross per 24 hour  Intake 240 ml  Output 63 ml  Net 177 ml    Additional Objective Labs: Basic Metabolic Panel: Recent Labs  Lab 09/15/23 1142 09/17/23 0632 09/18/23 0834  NA 142 140 135  K 3.3* 3.2* 3.4*  CL 96* 95* 92*  CO2 26 26 24   GLUCOSE 256* 191* 89  BUN 81* 83* 81*  CREATININE 17.05* 16.33* 16.57*  CALCIUM  8.2* 8.0* 8.3*   Liver Function Tests: Recent Labs  Lab 09/15/23 1142 09/17/23 0632  AST 24 12*  ALT <5 <5  ALKPHOS 53 49  BILITOT 0.6 0.5  PROT 5.1* 4.9*  ALBUMIN 1.9* 1.7*   No results for input(s): LIPASE, AMYLASE in the last 168 hours. CBC: Recent Labs  Lab 09/15/23 1142 09/16/23 0637 09/17/23 0632 09/18/23 0949  WBC 10.4 8.3 9.0 9.5  NEUTROABS 8.7* 6.5  --   8.3*  HGB 9.6* 9.8* 9.8* 10.3*  HCT 30.1* 30.5* 30.4* 32.9*  MCV 103.1* 100.7* 100.7* 101.2*  PLT 173 165 154 184   Blood Culture    Component Value Date/Time   SDES PERITONEAL 09/15/2023 1540   SPECREQUEST NONE 09/15/2023 1540   CULT PENDING 09/15/2023 1540   REPTSTATUS PENDING 09/15/2023 1540    Cardiac Enzymes: No results for input(s): CKTOTAL, CKMB, CKMBINDEX, TROPONINI in the last 168 hours. CBG: Recent Labs  Lab 09/18/23 0556 09/18/23 0640 09/18/23 0709 09/18/23 0754 09/18/23 1019  GLUCAP 30* 32* 93 96 115*   Iron  Studies: No results for input(s): IRON , TIBC, TRANSFERRIN, FERRITIN in the last 72 hours. Lab Results  Component Value Date   INR 1.2 03/08/2023   INR 1.2 03/07/2023   INR 1.1 04/19/2019   Studies/Results: ECHOCARDIOGRAM COMPLETE Result Date: 09/16/2023    ECHOCARDIOGRAM REPORT   Patient Name:   JOHNCHRISTOPHER SARVIS Date of Exam: 09/16/2023 Medical Rec #:  [id number]       Height:       71.0 in Accession #:    [id number]      Weight:       158.1 lb Date of Birth:  01-02-1934        BSA:          1.908 m  Patient Age:    89 years        BP:           138/63 mmHg Patient Gender: M               HR:           74 bpm. Exam Location:  Inpatient Procedure: 2D Echo, 3D Echo, Cardiac Doppler and Color Doppler (Both Spectral            and Color Flow Doppler were utilized during procedure). Indications:    Elevated Troponin  History:        Patient has prior history of Echocardiogram examinations, most                 recent 10/06/2022. CHF, CAD, Prior CABG, PAD; Risk                 Factors:Hypertension, Diabetes, Dyslipidemia and Former Smoker.  Sonographer:    Koleen Popper RDCS Referring Phys: [id number] SUBRINA SUNDIL IMPRESSIONS  1. Left ventricular ejection fraction, by estimation, is 40 to 45%. The left ventricle has mildly decreased function. The left ventricle demonstrates regional wall motion abnormalities (see scoring diagram/findings for description). Left  ventricular diastolic parameters are consistent with Grade II diastolic dysfunction (pseudonormalization). Elevated left atrial pressure. There is severe hypokinesis of the left ventricular, entire inferolateral wall. There is mild hypokinesis of the left ventricular, basal-mid lateral wall and inferior wall.  2. Right ventricular systolic function is mildly reduced. The right ventricular size is normal. There is normal pulmonary artery systolic pressure. The estimated right ventricular systolic pressure is 20.1 mmHg.  3. Left atrial size was moderately dilated.  4. The mitral valve is degenerative. Mild to moderate mitral valve regurgitation. No evidence of mitral stenosis. Moderate mitral annular calcification.  5. The aortic valve is tricuspid. There is severe calcifcation of the aortic valve. There is severe thickening of the aortic valve. Aortic valve regurgitation is trivial. Severe aortic valve stenosis. Aortic valve mean gradient measures 31.0 mmHg. Aortic valve Vmax measures 3.68 m/s. Comparison(s): The left ventricular function is worsened. The left ventricular wall motion abnormalities are new. Aortic stenosis is worse and meets criteria for low flow low gradient severe aortic stenosis. FINDINGS  Left Ventricle: Left ventricular ejection fraction, by estimation, is 40 to 45%. The left ventricle has mildly decreased function. The left ventricle demonstrates regional wall motion abnormalities. Severe hypokinesis of the left ventricular, entire inferolateral wall. Mild hypokinesis of the left ventricular, basal-mid lateral wall and inferior wall. 3D ejection fraction reviewed and evaluated as part of the interpretation. Alternate measurement of EF is felt to be most reflective of LV function. The left ventricular internal cavity size was normal in size. There is no left ventricular hypertrophy. Abnormal (paradoxical) septal motion consistent with post-operative status. Left ventricular diastolic parameters  are consistent with Grade II diastolic dysfunction (pseudonormalization). Elevated left atrial pressure.  LV Wall Scoring: The antero-lateral wall, posterior wall, and basal inferior segment are hypokinetic. The entire anterior wall, entire septum, entire apex, and mid and distal inferior wall are normal. Right Ventricle: The right ventricular size is normal. No increase in right ventricular wall thickness. Right ventricular systolic function is mildly reduced. There is normal pulmonary artery systolic pressure. The tricuspid regurgitant velocity is 2.07 m/s, and with an assumed right atrial pressure of 3 mmHg, the estimated right ventricular systolic pressure is 20.1 mmHg. Left Atrium: Left atrial size was moderately dilated. Right Atrium: Right atrial size was  normal in size. Pericardium: There is no evidence of pericardial effusion. Mitral Valve: The mitral valve is degenerative in appearance. Moderate mitral annular calcification. Mild to moderate mitral valve regurgitation, with centrally-directed jet. No evidence of mitral valve stenosis. Tricuspid Valve: The tricuspid valve is normal in structure. Tricuspid valve regurgitation is trivial. Aortic Valve: The aortic valve is tricuspid. There is severe calcifcation of the aortic valve. There is severe thickening of the aortic valve. Aortic valve regurgitation is trivial. Severe aortic stenosis is present. Aortic valve mean gradient measures 31.0 mmHg. Aortic valve peak gradient measures 54.0 mmHg. Aortic valve area, by VTI measures 0.73 cm. Pulmonic Valve: The pulmonic valve was not well visualized. Pulmonic valve regurgitation is not visualized. No evidence of pulmonic stenosis. Aorta: The aortic root is normal in size and structure. IAS/Shunts: No atrial level shunt detected by color flow Doppler. Additional Comments: 3D was performed not requiring image post processing on an independent workstation and was abnormal.  LEFT VENTRICLE PLAX 2D LVIDd:         4.70  cm   Diastology LVIDs:         4.40 cm   LV e' medial:    3.65 cm/s LV PW:         1.10 cm   LV E/e' medial:  27.9 LV IVS:        1.10 cm   LV e' lateral:   6.00 cm/s LVOT diam:     1.94 cm   LV E/e' lateral: 17.0 LV SV:         57 LV SV Index:   30 LVOT Area:     2.96 cm                           3D Volume EF:                          3D EF:        32 %                          LV EDV:       136 ml                          LV ESV:       93 ml                          LV SV:        43 ml RIGHT VENTRICLE            IVC RV Basal diam:  3.80 cm    IVC diam: 1.30 cm RV S prime:     8.60 cm/s TAPSE (M-mode): 2.0 cm LEFT ATRIUM             Index        RIGHT ATRIUM           Index LA diam:        4.80 cm 2.52 cm/m   RA Area:     12.70 cm LA Vol (A2C):   50.8 ml 26.63 ml/m  RA Volume:   30.10 ml  15.78 ml/m LA Vol (A4C):   54.9 ml 28.77 ml/m LA Biplane Vol: 55.1 ml 28.88 ml/m  AORTIC VALVE AV Area (Vmax):    0.72 cm AV  Area (Vmean):   0.66 cm AV Area (VTI):     0.73 cm AV Vmax:           367.50 cm/s AV Vmean:          260.500 cm/s AV VTI:            0.783 m AV Peak Grad:      54.0 mmHg AV Mean Grad:      31.0 mmHg LVOT Vmax:         89.10 cm/s LVOT Vmean:        58.200 cm/s LVOT VTI:          0.194 m LVOT/AV VTI ratio: 0.25  AORTA Ao Root diam: 3.40 cm Ao Asc diam:  2.90 cm MITRAL VALVE                TRICUSPID VALVE MV Area (PHT): 3.31 cm     TR Peak grad:   17.1 mmHg MV Decel Time: 229 msec     TR Vmax:        207.00 cm/s MV E velocity: 102.00 cm/s MV A velocity: 134.00 cm/s  SHUNTS MV E/A ratio:  0.76         Systemic VTI:  0.19 m                             Systemic Diam: 1.94 cm Mihai Croitoru MD Electronically signed by Jerel Balding MD Signature Date/Time: 09/16/2023/2:05:05 PM    Final     Medications:  cefTRIAXone  (ROCEPHIN )  IV 2 g (09/18/23 0604)   dialysis solution 1.5% low-MG/low-CA     dialysis solution 2.5% low-MG/low-CA     fluconazole  (DIFLUCAN ) IV 100 mg (09/18/23 0303)    (feeding  supplement) PROSource Plus  30 mL Oral QODAY   aspirin  EC  81 mg Oral Daily   atorvastatin   80 mg Oral Daily   carvedilol   3.125 mg Oral BID WC   Chlorhexidine  Gluconate Cloth  6 each Topical Q0600   folic acid   0.5 mg Oral Daily   gentamicin  cream  1 Application Topical Daily   heparin   5,000 Units Subcutaneous Q8H   insulin  aspart  0-5 Units Subcutaneous QHS   insulin  aspart  0-6 Units Subcutaneous TID WC   linaclotide   145 mcg Oral QAC breakfast   pantoprazole   40 mg Oral Daily   sevelamer  carbonate  2,400 mg Oral BID   sodium chloride  flush  3 mL Intravenous Q12H    Dialysis Orders: [facility] - 5 dwells, 2L, 1.5hrs EDW 64kg   Assessment/Plan: AMS - baseline dementia.  Recent peritonitis.  Possible re-occurrence - results pending for cell count and culture. CXR with small opacity. Further work up pending.  ESRD -  PD catheter is still malfunctioning. Ongoing alarming overnight and continues to drain slowly. KUB unremarkable. Discussed with patient's primary Nephrologist. Transition back to IHD-on HD today. Consulted VVS for PD catheter removal-spoke with [facility]VS PA. Discussed plan with patient's wife by phone yesterday. Obtaining labs in AM. Elevated troponins/Abnormality on recent ECHO - Cardiology consulted yesterday Hypertension/volume  - BP in goal without medications.  Not overloaded on exam.   Anemia of CKD - Last Hgb 10.3.  Last mircera dose 5/17.  Follow trend. If Hgb dropping will start again here.  Secondary Hyperparathyroidism -  CCa ok, check phos in AM. Continue home meds Nutrition - Regular diet with fluid restrictions due to low K.  Charmaine Piety,  NP [facility] 09/18/2023,11:15 AM  LOS: 2 days

## 2023-09-19 ENCOUNTER — Inpatient Hospital Stay (HOSPITAL_COMMUNITY)

## 2023-09-19 ENCOUNTER — Ambulatory Visit: Admitting: Physician Assistant

## 2023-09-19 DIAGNOSIS — G9340 Encephalopathy, unspecified: Secondary | ICD-10-CM | POA: Diagnosis not present

## 2023-09-19 LAB — CBC WITH DIFFERENTIAL/PLATELET
Abs Immature Granulocytes: 0.03 K/uL (ref 0.00–0.07)
Basophils Absolute: 0 K/uL (ref 0.0–0.1)
Basophils Relative: 0 %
Eosinophils Absolute: 0.1 K/uL (ref 0.0–0.5)
Eosinophils Relative: 1 %
HCT: 28.5 % — ABNORMAL LOW (ref 39.0–52.0)
Hemoglobin: 9.2 g/dL — ABNORMAL LOW (ref 13.0–17.0)
Immature Granulocytes: 0 %
Lymphocytes Relative: 12 %
Lymphs Abs: 0.9 K/uL (ref 0.7–4.0)
MCH: 32.5 pg (ref 26.0–34.0)
MCHC: 32.3 g/dL (ref 30.0–36.0)
MCV: 100.7 fL — ABNORMAL HIGH (ref 80.0–100.0)
Monocytes Absolute: 0.7 K/uL (ref 0.1–1.0)
Monocytes Relative: 10 %
Neutro Abs: 5.4 K/uL (ref 1.7–7.7)
Neutrophils Relative %: 77 %
Platelets: 164 K/uL (ref 150–400)
RBC: 2.83 MIL/uL — ABNORMAL LOW (ref 4.22–5.81)
RDW: 14.9 % (ref 11.5–15.5)
WBC: 7.1 K/uL (ref 4.0–10.5)
nRBC: 0 % (ref 0.0–0.2)

## 2023-09-19 LAB — RENAL FUNCTION PANEL
Albumin: 1.6 g/dL — ABNORMAL LOW (ref 3.5–5.0)
Anion gap: 11 (ref 5–15)
BUN: 38 mg/dL — ABNORMAL HIGH (ref 8–23)
CO2: 27 mmol/L (ref 22–32)
Calcium: 8.1 mg/dL — ABNORMAL LOW (ref 8.9–10.3)
Chloride: 96 mmol/L — ABNORMAL LOW (ref 98–111)
Creatinine, Ser: 8.86 mg/dL — ABNORMAL HIGH (ref 0.61–1.24)
GFR, Estimated: 5 mL/min — ABNORMAL LOW (ref >60)
Glucose, Bld: 172 mg/dL — ABNORMAL HIGH (ref 70–99)
Phosphorus: 5.5 mg/dL — ABNORMAL HIGH (ref 2.5–4.6)
Potassium: 3.4 mmol/L — ABNORMAL LOW (ref 3.5–5.1)
Sodium: 134 mmol/L — ABNORMAL LOW (ref 135–145)

## 2023-09-19 LAB — GLUCOSE, CAPILLARY
Glucose-Capillary: 160 mg/dL — ABNORMAL HIGH (ref 70–99)
Glucose-Capillary: 212 mg/dL — ABNORMAL HIGH (ref 70–99)
Glucose-Capillary: 203 mg/dL — ABNORMAL HIGH (ref 70–99)
Glucose-Capillary: 196 mg/dL — ABNORMAL HIGH (ref 70–99)

## 2023-09-19 MED ORDER — FLUCONAZOLE 100 MG PO TABS
100.0000 mg | ORAL_TABLET | Freq: Every day | ORAL | Status: DC
  Administered 2023-09-19 – 2023-09-20 (×2): 100 mg via ORAL
  Filled 2023-09-19 (×6): qty 1

## 2023-09-19 MED ORDER — CHLORHEXIDINE GLUCONATE CLOTH 2 % EX PADS
6.0000 | MEDICATED_PAD | Freq: Every day | CUTANEOUS | Status: DC
  Administered 2023-09-20 – 2023-09-25 (×5): 6 via TOPICAL

## 2023-09-19 MED ORDER — MUSCLE RUB 10-15 % EX CREA
TOPICAL_CREAM | CUTANEOUS | Status: DC | PRN
  Administered 2023-09-20: 1 via TOPICAL
  Filled 2023-09-19: qty 85

## 2023-09-19 NOTE — Plan of Care (Signed)
   Problem: Elimination: Goal: Will not experience complications related to bowel motility Outcome: Progressing

## 2023-09-19 NOTE — Progress Notes (Signed)
  [facility] Progress Note   Subjective:    Seen and examined patient at bedside. Tolerated yesterday's HD with no UF. Per VVS, no indication for PD catheter removal given high risk of general anesthetic. Next HD 7/24.  Objective Vitals:   09/19/23 0415 09/19/23 0751 09/19/23 1300 09/19/23 1617  BP: 127/63 (!) 154/73 128/70 (!) 140/67  Pulse: 67 70 71 61  Resp: 16 17 16 16   Temp: 98.1 F (36.7 C) 97.9 F (36.6 C) 98 F (36.7 C) (!) 97.5 F (36.4 C)  TempSrc: Oral   Oral  SpO2: 100%  100% 99%  Weight:      Height:       Physical Exam General:chronically ill appearing, frail male in NAD Heart: RRR, +3/6 systolic murmur Lungs:CTAB, nml WOB on RA Abdomen:soft, NTND Extremities:no LE edema Dialysis Access: PD cath (malfunctioned), [facility]   Filed Weights   09/18/23 0925 09/18/23 0934 09/18/23 1301  Weight: 63.7 kg 63.7 kg 63.7 kg    Intake/Output Summary (Last 24 hours) at 09/19/2023 1709 Last data filed at 09/19/2023 1000 Gross per 24 hour  Intake 240 ml  Output --  Net 240 ml    Additional Objective Labs: Basic Metabolic Panel: Recent Labs  Lab 09/17/23 0632 09/18/23 0834 09/19/23 0512  NA 140 135 134*  K 3.2* 3.4* 3.4*  CL 95* 92* 96*  CO2 26 24 27   GLUCOSE 191* 89 172*  BUN 83* 81* 38*  CREATININE 16.33* 16.57* 8.86*  CALCIUM  8.0* 8.3* 8.1*  PHOS  --   --  5.5*   Liver Function Tests: Recent Labs  Lab 09/15/23 1142 09/17/23 0632 09/19/23 0512  AST 24 12*  --   ALT <5 <5  --   ALKPHOS 53 49  --   BILITOT 0.6 0.5  --   PROT 5.1* 4.9*  --   ALBUMIN 1.9* 1.7* 1.6*   No results for input(s): LIPASE, AMYLASE in the last 168 hours. CBC: Recent Labs  Lab 09/15/23 1142 09/16/23 0637 09/17/23 0632 09/18/23 0949 09/19/23 0512  WBC 10.4 8.3 9.0 9.5 7.1  NEUTROABS 8.7* 6.5  --  8.3* 5.4  HGB 9.6* 9.8* 9.8* 10.3* 9.2*  HCT 30.1* 30.5* 30.4* 32.9* 28.5*  MCV 103.1* 100.7* 100.7* 101.2* 100.7*  PLT 173 165 154 184 164   Blood  Culture    Component Value Date/Time   SDES PERITONEAL 09/15/2023 1540   SPECREQUEST NONE 09/15/2023 1540   CULT  09/15/2023 1540    NO GROWTH 2 DAYS Performed at [facility], [address], [place], [place] [address]    REPTSTATUS PENDING 09/15/2023 1540    Cardiac Enzymes: No results for input(s): CKTOTAL, CKMB, CKMBINDEX, TROPONINI in the last 168 hours. CBG: Recent Labs  Lab 09/18/23 1607 09/18/23 2145 09/19/23 0559 09/19/23 1433 09/19/23 1615  GLUCAP 162* 165* 160* 212* 203*   Iron  Studies: No results for input(s): IRON , TIBC, TRANSFERRIN, FERRITIN in the last 72 hours. Lab Results  Component Value Date   INR 1.2 03/08/2023   INR 1.2 03/07/2023   INR 1.1 04/19/2019   Studies/Results: DG Shoulder Left Port Result Date: 09/19/2023 CLINICAL DATA:  Arthritis.  Shoulder pain EXAM: LEFT SHOULDER COMPARISON:  None Available. FINDINGS: Glenohumeral joint is intact. No evidence of scapular fracture or humeral fracture. The acromioclavicular joint is intact. Degenerate spurring of the glenoid fossa. IMPRESSION: 1. No acute findings. 2. Mild glenohumeral osteoarthritis. Electronically Signed   By: Jackquline Boxer M.D.   On: 09/19/2023 16:19  Medications:  cefTRIAXone  (ROCEPHIN )  IV 2 g (09/19/23 0524)    (feeding supplement) PROSource Plus  30 mL Oral QODAY   aspirin  EC  81 mg Oral Daily   atorvastatin   80 mg Oral Daily   carvedilol   3.125 mg Oral BID WC   Chlorhexidine  Gluconate Cloth  6 each Topical Q0600   fluconazole   100 mg Oral Daily   folic acid   0.5 mg Oral Daily   gentamicin  cream  1 Application Topical Daily   heparin   5,000 Units Subcutaneous Q8H   linaclotide   145 mcg Oral QAC breakfast   pantoprazole   40 mg Oral Daily   sevelamer  carbonate  2,400 mg Oral BID WC   sodium chloride  flush  3 mL Intravenous Q12H    Dialysis Orders: [facility] - 5 dwells, 2L, 1.5hrs EDW 64kg  *Transitioned to IHD on 09/18/23*  Assessment/Plan: AMS -  baseline dementia.  Recent peritonitis.  Possible re-occurrence - prelim fluid culture no growth X 2 days. CXR with small opacity. Further work up pending.  ESRD -  PD catheter malfunctioning. Ongoing alarming and draining slowly. KUB unremarkable. Discussed with patient's primary Nephrologist. Transitioned back to IHD on 7/22. Per VVS, PD catheter removal is not indicated at this time given high risk of a general anesthetic. K+ 3.4, will place him on 3K bath with HD. Elevated troponins/Abnormality on recent ECHO - Cardiology consulted. Patient is deemed a poor candidate for heart catheterization and TAVR.  Hypertension/volume  - BP in goal without medications.  Not overloaded on exam.   Anemia of CKD - Hgb now 9.2. Last mircera dose 5/17.   Secondary Hyperparathyroidism -  CCa ok and phos okay. Continue home meds Nutrition - Regular diet with fluid restrictions due to low K.  Charmaine Piety, NP [facility] 09/19/2023,5:09 PM  LOS: 3 days

## 2023-09-19 NOTE — Plan of Care (Signed)
  Problem: Education: Goal: Knowledge of General Education information will improve Description: Including pain rating scale, medication(s)/side effects and non-pharmacologic comfort measures Outcome: Not Progressing   Problem: Health Behavior/Discharge Planning: Goal: Ability to manage health-related needs will improve Outcome: Not Progressing   Problem: Clinical Measurements: Goal: Ability to maintain clinical measurements within normal limits will improve Outcome: Progressing Goal: Will remain free from infection Outcome: Progressing Goal: Diagnostic test results will improve Outcome: Progressing Goal: Respiratory complications will improve Outcome: Progressing Goal: Cardiovascular complication will be avoided Outcome: Progressing   Problem: Activity: Goal: Risk for activity intolerance will decrease Outcome: Progressing   Problem: Nutrition: Goal: Adequate nutrition will be maintained Outcome: Progressing   Problem: Coping: Goal: Level of anxiety will decrease Outcome: Progressing   Problem: Elimination: Goal: Will not experience complications related to bowel motility Outcome: Progressing Goal: Will not experience complications related to urinary retention Outcome: Progressing   Problem: Pain Managment: Goal: General experience of comfort will improve and/or be controlled Outcome: Progressing   Problem: Safety: Goal: Ability to remain free from injury will improve Outcome: Progressing   Problem: Skin Integrity: Goal: Risk for impaired skin integrity will decrease Outcome: Progressing   Problem: Education: Goal: Ability to describe self-care measures that may prevent or decrease complications (Diabetes Survival Skills Education) will improve Outcome: Progressing Goal: Individualized Educational Video(s) Outcome: Progressing   Problem: Fluid Volume: Goal: Ability to maintain a balanced intake and output will improve Outcome: Progressing   Problem:  Health Behavior/Discharge Planning: Goal: Ability to identify and utilize available resources and services will improve Outcome: Progressing Goal: Ability to manage health-related needs will improve Outcome: Progressing   Problem: Skin Integrity: Goal: Risk for impaired skin integrity will decrease Outcome: Progressing   Problem: Tissue Perfusion: Goal: Adequacy of tissue perfusion will improve Outcome: Progressing

## 2023-09-19 NOTE — Hospital Course (Addendum)
 Patient with PMH of HTN, ESRD on PD, dementia, CAD SP CABG, moderate aortic stenosis, chronic HFpEF presented to the hospital with complaints of confusion as well as poor p.o. intake and nausea and vomiting. Was being transition to HD. Now comfort care.  Assessment and Plan: Acute metabolic encephalopathy in the setting of uremia. Baseline dementia. CT head negative for any acute abnormality. Code stroke was called on 7/22 morning.  Although later on canceled.  Mentation initially waxing and waning and now progressively worsening. Now comfort care.  ESRD Was on PD.  Nephrology was consulted. There is concern for PD catheter malfunctioning with poor drainage. Nephrology discussed with patient's primary nephrologist and plan was to switch to IHD. VVS was consulted for PD removal although they feel that the patient is too high risk for any procedure and therefore would not recommend PD removal unless there is any other significant indication. Unable to tolerate HD with bradycardic events in the setting of CAD and severe aortic stenosis. Now comfort care.  CAD with prior history of CABG. Non-STEMI-demand ischemia Chronic systolic and diastolic CHF. PAD Low-flow low gradient severe aortic stenosis. EF 40 to 45%. Per cardiology, cath in December 2024, no good option for revascularization for CAD and recommended medical management based on prior workup. Graft to OM is closed and known proximal LCx occlusion. Troponin peaked at 7244.  Trending down GDMT limited secondary to ESRD and hypotension. Also has severe aortic stenosis with low flow. Not a candidate for redo sternotomy and most likely at a high risk for TAVR. Medical management recommended. Outpatient follow-up with Dr. Hilty/cardiology. Now comfort care.  Nausea and diarrhea. Currently resolved. Able to tolerate oral diet although minimal oral intake.  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  Uncontrolled  with hyper and hypoglycemia. Now comfort care.  Left shoulder pain. x-ray shows arthritis. Continue pain control.  Goals of care conversation. Given that the patient has severe aortic stenosis with poor candidacy for any surgery, patient's prognosis is very poor in the setting of need for hemodialysis and poor p.o. intake. Hypotension episode can be life-threatening for the patient. Also mentation has not improved back to his baseline and whether this is delirium or undiagnosed dementia, or uremia prognosis remains poor. Palliative care is consulted. Family meeting on 7/25.  Patient's prognosis is very poor in the setting of severe aortic stenosis and inability to tolerate hemodialysis.  Family understand the risk and agrees to transition to comfort care in the hospital and eventual discharge to residential hospice.  Delirium. Etiology not clear. Likely in the setting of uremia and poor clearance in the setting of PD and therefore currently switched to HD. Now comfort care.

## 2023-09-19 NOTE — Progress Notes (Signed)
 [facility] Progress Note [name] FMW:[id number] DOB: 1933/06/10 DOA: 09/15/2023  DOS: the patient was seen and examined on 09/19/2023  Brief Hospital Course: Patient with PMH of HTN, ESRD on PD, dementia, CAD SP CABG, moderate aortic stenosis, chronic HFpEF presented to the hospital with complaints of confusion as well as poor p.o. intake and nausea and vomiting. Now transition to HD. Assessment and Plan: Acute metabolic encephalopathy in the setting of uremia. Baseline dementia. Mentation appears to be improving although still somewhat confused. Per family.  Close to baseline unknown time of evaluation on 7/23. CT head negative for any acute abnormality. Currently receiving IV ceftriaxone  for possible peritonitis although cultures are negative. Code stroke was called on 7/22 morning.  Although later on consult. Monitor for now.  Nausea and diarrhea. Currently resolved. Able to tolerate oral diet although minimal oral intake.  ESRD. Was on PD.  Nephrology was consulted. There is concern for PD catheter malfunctioning with poor drainage. Nephrology discussed with patient's primary nephrologist and plan is to switch to IHD. [facility] was consulted for PD removal although they feel that the patient is too high risk for any procedure and therefore would not recommend PD removal unless there is any other significant indication.  Elevated troponin. CAD with prior history of CABG. Chronic systolic and diastolic CHF. PAD Low-flow low gradient severe aortic stenosis. EF 40 to 45%. Per cardiology, no good option for revascularization for CAD and recommended medical management based on prior workup. Elevated troponin mostly demand ischemia rather than acute ACS. Continue Coreg , aspirin  and Lipitor . GDMT limited secondary to ESRD and hypotension. Not a candidate for redo sternotomy and most likely at a high risk for TAVR. Medical management recommended. Outpatient follow-up  with Dr. Hilty/cardiology.  Currently scheduled for 8/12.  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 Currently on sliding scale insulin . Monitor.  Left shoulder pain. Check x-ray.  Subjective: No nausea no vomiting no fever no chills. No Chest pain.  Reports left shoulder pain.  Physical Exam: Clear to auscultation. S1-S2 present Bowel sound present.  No tenderness with Catheter appears to be in place. Left shoulder tenderness patient unable to perform range of motion of the left shoulder area. No edema.  Data Reviewed: I have Reviewed nursing notes, Vitals, and Lab results. Since last encounter, pertinent lab results CBC and BMP   . I have ordered test including CBC and BMP  .   Disposition: Status is: Inpatient Remains inpatient appropriate because: Monitor for improvement in mentation  heparin  injection 5,000 Units Start: 09/15/23 1615   Family Communication: Family at bedside Level of care: Med-Surg   Vitals:   09/19/23 0415 09/19/23 0751 09/19/23 1300 09/19/23 1617  BP: 127/63 (!) 154/73 128/70 (!) 140/67  Pulse: 67 70 71 61  Resp: 16 17 16 16   Temp: 98.1 F (36.7 C) 97.9 F (36.6 C) 98 F (36.7 C) (!) 97.5 F (36.4 C)  TempSrc: Oral   Oral  SpO2: 100%  100% 99%  Weight:      Height:         Author: Yetta Blanch, MD 09/19/2023 6:16 PM  Please look on [email or web address] to find out who is on call.

## 2023-09-19 NOTE — Progress Notes (Signed)
 This patient is receiving the antimicrobial fluconazole  by the intravenous route.   Based on criteria approved by the Pharmacy and Therapeutics Committee, and the  Infectious Disease Division, the antibiotic(s) is / are being converted to equivalent oral dose form(s). These criteria include:  Patient being treated for a respiratory tract infection, urinary tract infection, cellulitis, or Clostridium Difficile associated diarrhea  The patient is not neutropenic and does not exhibit a GI malabsorption state  The patient is eating (either orally or per tube) and/or has been taking other orally administered medications for at least 24 hours.  The patient is improving clinically (physician assessment and a 24-hour Tmax of 100.5 F).   If you have questions about this conversion, please contact the pharmacy department.   Thank you for allowing pharmacy to be a part of this patient's care.  Shelba Collier, PharmD, BCPS Clinical Pharmacist

## 2023-09-20 DIAGNOSIS — G9340 Encephalopathy, unspecified: Secondary | ICD-10-CM | POA: Diagnosis not present

## 2023-09-20 LAB — BODY FLUID CULTURE W GRAM STAIN: Culture: NO GROWTH

## 2023-09-20 LAB — RENAL FUNCTION PANEL
Albumin: 1.9 g/dL — ABNORMAL LOW (ref 3.5–5.0)
Anion gap: 16 — ABNORMAL HIGH (ref 5–15)
BUN: 47 mg/dL — ABNORMAL HIGH (ref 8–23)
CO2: 26 mmol/L (ref 22–32)
Calcium: 8.2 mg/dL — ABNORMAL LOW (ref 8.9–10.3)
Chloride: 92 mmol/L — ABNORMAL LOW (ref 98–111)
Creatinine, Ser: 10.02 mg/dL — ABNORMAL HIGH (ref 0.61–1.24)
GFR, Estimated: 5 mL/min — ABNORMAL LOW (ref >60)
Glucose, Bld: 220 mg/dL — ABNORMAL HIGH (ref 70–99)
Phosphorus: 6.1 mg/dL — ABNORMAL HIGH (ref 2.5–4.6)
Potassium: 3.6 mmol/L (ref 3.5–5.1)
Sodium: 134 mmol/L — ABNORMAL LOW (ref 135–145)

## 2023-09-20 LAB — GLUCOSE, CAPILLARY
Glucose-Capillary: 226 mg/dL — ABNORMAL HIGH (ref 70–99)
Glucose-Capillary: 238 mg/dL — ABNORMAL HIGH (ref 70–99)
Glucose-Capillary: 134 mg/dL — ABNORMAL HIGH (ref 70–99)
Glucose-Capillary: 150 mg/dL — ABNORMAL HIGH (ref 70–99)

## 2023-09-20 LAB — CBC
HCT: 32 % — ABNORMAL LOW (ref 39.0–52.0)
Hemoglobin: 10.1 g/dL — ABNORMAL LOW (ref 13.0–17.0)
MCH: 32 pg (ref 26.0–34.0)
MCHC: 31.6 g/dL (ref 30.0–36.0)
MCV: 101.3 fL — ABNORMAL HIGH (ref 80.0–100.0)
Platelets: 184 K/uL (ref 150–400)
RBC: 3.16 MIL/uL — ABNORMAL LOW (ref 4.22–5.81)
RDW: 14.7 % (ref 11.5–15.5)
WBC: 6.4 K/uL (ref 4.0–10.5)
nRBC: 0 % (ref 0.0–0.2)

## 2023-09-20 LAB — MAGNESIUM: Magnesium: 1.8 mg/dL (ref 1.7–2.4)

## 2023-09-20 MED ORDER — ALTEPLASE 2 MG IJ SOLR: 2.0000 mg | Freq: Once | INTRAMUSCULAR | Status: DC | PRN

## 2023-09-20 MED ORDER — INSULIN ASPART 100 UNIT/ML IJ SOLN: 0.0000 [IU] | Freq: Every day | INTRAMUSCULAR | Status: DC

## 2023-09-20 MED ORDER — PENTAFLUOROPROP-TETRAFLUOROETH EX AERO
1.0000 | INHALATION_SPRAY | CUTANEOUS | Status: DC | PRN
Start: 2023-09-20 — End: 2023-09-20

## 2023-09-20 MED ORDER — SODIUM CHLORIDE 0.9 % IV SOLN
2.0000 g | INTRAVENOUS | Status: AC
  Administered 2023-09-21: 2 g via INTRAVENOUS
  Filled 2023-09-20: qty 20

## 2023-09-20 MED ORDER — LIDOCAINE-PRILOCAINE 2.5-2.5 % EX CREA
1.0000 | TOPICAL_CREAM | CUTANEOUS | Status: DC | PRN
Start: 2023-09-20 — End: 2023-09-20

## 2023-09-20 MED ORDER — LIDOCAINE HCL (PF) 1 % IJ SOLN
5.0000 mL | INTRAMUSCULAR | Status: DC | PRN
Start: 2023-09-20 — End: 2023-09-20

## 2023-09-20 MED ORDER — HEPARIN SODIUM (PORCINE) 1000 UNIT/ML DIALYSIS: 1000.0000 [IU] | INTRAMUSCULAR | Status: DC | PRN

## 2023-09-20 MED ORDER — INSULIN ASPART 100 UNIT/ML IJ SOLN
0.0000 [IU] | Freq: Three times a day (TID) | INTRAMUSCULAR | Status: DC
  Administered 2023-09-21: 1 [IU] via SUBCUTANEOUS

## 2023-09-20 NOTE — Progress Notes (Signed)
 Discussed pt's case with renal NP. Pt will require in-center HD at d/c. Pt received PD care at [facility] prior to admission and pt's nephrologist is Dr Tobie. Contacted [facility] this morning to inquire if clinic would be able to accommodate pt in-center at d/c. Clinic manager feels this will be possible and to confirm arrangements with navigator once schedule is known. Will assist as needed.   Randine Mungo Dialysis Navigator [phone]

## 2023-09-20 NOTE — Evaluation (Signed)
 Occupational Therapy Evaluation Patient Details Name: [name] MRN: [id number] DOB: 16-Apr-1933 Today's Date: 09/20/2023   History of Present Illness   Pt is a 89 y.o. male admitted 7/19 for AMS. CT and chest x-ray negative. Labs show elevated troponin. PMH: ESRD, CAD s/p CABG, HTN, HLD, PAD, DM, prostate cancer, dementia     Clinical Impressions Pt admitted based on above, and was seen based on problem list below.No family/caregiver present during session, so unsure of pt's PLOF for ADLs. Today pt is requiring min  to  max +2 assist  for  ADLs. Bed mobility and functional transfers are  max +2 . Pt presenting with significantly decreased cog, not oriented, unable to follow simple commands. Pt would benefit from <3 hours of skilled rehab daily, if family would prefer for pt to d/c home, then pt should d/c with max HH services and DME below. OT will continue to follow acutely to maximize functional independence.      If plan is discharge home, recommend the following:   Two people to help with walking and/or transfers;Two people to help with bathing/dressing/bathroom     Functional Status Assessment   Patient has had a recent decline in their functional status and demonstrates the ability to make significant improvements in function in a reasonable and predictable amount of time.     Equipment Recommendations   BSC/3in1;Wheelchair (measurements OT);Wheelchair cushion (measurements OT);Hoyer lift;Hospital bed      Precautions/Restrictions   Precautions Precautions: Fall Recall of Precautions/Restrictions: Impaired Restrictions Weight Bearing Restrictions Per Provider Order: No     Mobility Bed Mobility Overal bed mobility: Needs Assistance Bed Mobility: Supine to Sit, Sit to Supine     Supine to sit: Max assist, +2 for physical assistance, HOB elevated Sit to supine: Max assist, +2 for physical assistance   General bed mobility comments: assist for all  aspects of mobility    Transfers Overall transfer level: Needs assistance Equipment used: Rolling walker (2 wheels) Transfers: Sit to/from Stand Sit to Stand: +2 safety/equipment, Mod assist, From elevated surface           General transfer comment: Mod +2 assist to come to stand, posterior and right lateral lean      Balance Overall balance assessment: Needs assistance Sitting-balance support: Feet supported, Bilateral upper extremity supported Sitting balance-Leahy Scale: Fair   Postural control: Posterior lean, Right lateral lean Standing balance support: Bilateral upper extremity supported, During functional activity, Reliant on assistive device for balance Standing balance-Leahy Scale: Poor Standing balance comment: Reliant on RW and external support         ADL either performed or assessed with clinical judgement   ADL Overall ADL's : Needs assistance/impaired Eating/Feeding: Set up;Sitting Eating/Feeding Details (indicate cue type and reason): Set up to min assist, cog limiting Grooming: Minimal assistance;Sitting   Upper Body Bathing: Minimal assistance;Sitting   Lower Body Bathing: Maximal assistance;+2 for physical assistance;Sit to/from stand   Upper Body Dressing : Minimal assistance;Sitting   Lower Body Dressing: Maximal assistance;+2 for physical assistance;Sit to/from stand   Toilet Transfer: Maximal assistance;Rolling walker (2 wheels);+2 for physical assistance;+2 for safety/equipment;Stand-pivot Toilet Transfer Details (indicate cue type and reason): Simulated in room         Functional mobility during ADLs: Maximal assistance;+2 for safety/equipment;+2 for physical assistance;Rolling walker (2 wheels) General ADL Comments: ROM intact for UB ADLs, cog limiting performance for sequencing, initation, organization etc     Vision Baseline Vision/History: 0 No visual deficits Additional Comments: To be  further assessed as cog improves             Pertinent Vitals/Pain Pain Assessment Pain Assessment: Faces Faces Pain Scale: Hurts little more Pain Location: generalized Pain Descriptors / Indicators: Grimacing, Sore Pain Intervention(s): Monitored during session     Extremity/Trunk Assessment Upper Extremity Assessment Upper Extremity Assessment: Generalized weakness   Lower Extremity Assessment Lower Extremity Assessment: Defer to PT evaluation   Cervical / Trunk Assessment Cervical / Trunk Assessment: Kyphotic   Communication Communication Communication: Impaired Factors Affecting Communication: Hearing impaired   Cognition Arousal: Alert Behavior During Therapy: Flat affect Cognition: History of cognitive impairments, Cognition impaired   Orientation impairments: Person, Place, Time, Situation Awareness: Intellectual awareness impaired, Online awareness impaired Memory impairment (select all impairments): Short-term memory Attention impairment (select first level of impairment): Focused attention Executive functioning impairment (select all impairments): Initiation, Organization, Sequencing, Reasoning, Problem solving OT - Cognition Comments: Pt with vascular dementia at base, not oriented to self, unable to follow simple commands, difficulty attending to task                 Following commands: Impaired Following commands impaired: Follows one step commands inconsistently, Follows one step commands with increased time     Cueing  General Comments   Cueing Techniques: Verbal cues  Pt transitioned from PD to HD 7/22.           Home Living Family/patient expects to be discharged to:: Private residence Living Arrangements: Spouse/significant other Available Help at Discharge: Family;Available 24 hours/day Type of Home: House Home Access: Stairs to enter [employer] of Steps: 8 Entrance Stairs-Rails: Right Home Layout: One level     Bathroom Shower/Tub: Contractor: Standard     Home Equipment: Rollator (4 wheels);Cane - single point   Additional Comments: Pt unable to provide history. Home set up taken from previous admission (2021).      Prior Functioning/Environment Prior Level of Function : Patient poor historian/Family not available     Mobility Comments: ambulatory in the home. Unsure of current assist level needed. Mod I as of OPPT (Jan 2024).      OT Problem List: Decreased strength;Decreased range of motion;Decreased activity tolerance;Impaired balance (sitting and/or standing);Decreased cognition;Decreased safety awareness;Decreased knowledge of use of DME or AE   OT Treatment/Interventions: Self-care/ADL training;Therapeutic exercise;Energy conservation;DME and/or AE instruction;Therapeutic activities;Cognitive remediation/compensation;Patient/family education;Balance training      OT Goals(Current goals can be found in the care plan section)   Acute Rehab OT Goals Patient Stated Goal: None stated OT Goal Formulation: Patient unable to participate in goal setting Time For Goal Achievement: 10/04/23 Potential to Achieve Goals: Fair   OT Frequency:  Min 2X/week       AM-PAC OT 6 Clicks Daily Activity     Outcome Measure Help from another person eating meals?: A Little Help from another person taking care of personal grooming?: A Little Help from another person toileting, which includes using toliet, bedpan, or urinal?: Total Help from another person bathing (including washing, rinsing, drying)?: A Lot Help from another person to put on and taking off regular upper body clothing?: A Little Help from another person to put on and taking off regular lower body clothing?: A Lot 6 Click Score: 14   End of Session Equipment Utilized During Treatment: Gait belt;Rolling walker (2 wheels) Nurse Communication: Mobility status;Other (comment) (Potential need for feeder)  Activity Tolerance: Patient limited by  fatigue Patient left: in bed;with call bell/phone within reach;with  bed alarm set  OT Visit Diagnosis: Unsteadiness on feet (R26.81);Other abnormalities of gait and mobility (R26.89);Muscle weakness (generalized) (M62.81)                Time: 9257-9199 OT Time Calculation (min): 18 min Charges:  OT General Charges $OT Visit: 1 Visit OT Evaluation $OT Eval Moderate Complexity: 1 Mod  Adrianne BROCKS, OT  Acute Rehabilitation Services Office [phone] Secure chat preferred   Adrianne GORMAN Savers 09/20/2023, 9:07 AM

## 2023-09-20 NOTE — Progress Notes (Signed)
 [facility] Progress Note [name] FMW:[id number] DOB: February 05, 1934 DOA: 09/15/2023  DOS: the patient was seen and examined on 09/20/2023  Brief Hospital Course: Patient with PMH of HTN, ESRD on PD, dementia, CAD SP CABG, moderate aortic stenosis, chronic HFpEF presented to the hospital with complaints of confusion as well as poor p.o. intake and nausea and vomiting. Now transition to HD. Assessment and Plan: Acute metabolic encephalopathy in the setting of uremia. Baseline dementia. Mentation appears to be improving although still somewhat confused. Per family.  Close to baseline unknown time of evaluation on 7/23. CT head negative for any acute abnormality. Currently receiving IV ceftriaxone  for possible peritonitis although cultures are negative. Code stroke was called on 7/22 morning.  Although later on consult. Monitor for now.  Nausea and diarrhea. Currently resolved. Able to tolerate oral diet although minimal oral intake.  ESRD. Was on PD.  Nephrology was consulted. There is concern for PD catheter malfunctioning with poor drainage. Nephrology discussed with patient's primary nephrologist and plan is to switch to IHD. [facility] was consulted for PD removal although they feel that the patient is too high risk for any procedure and therefore would not recommend PD removal unless there is any other significant indication.  Elevated troponin. CAD with prior history of CABG. Chronic systolic and diastolic CHF. PAD Low-flow low gradient severe aortic stenosis. EF 40 to 45%. Per cardiology, no good option for revascularization for CAD and recommended medical management based on prior workup. Elevated troponin mostly demand ischemia rather than acute ACS. Continue Coreg , aspirin  and Lipitor . GDMT limited secondary to ESRD and hypotension. Not a candidate for redo sternotomy and most likely at a high risk for TAVR. Medical management recommended. Outpatient follow-up  with Dr. Hilty/cardiology.  Currently scheduled for 8/12.  Prognosis is poor.  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  Uncontrolled with hyper and hypoglycemia. Currently on sliding scale insulin . Monitor.  Left shoulder pain. x-ray shows arthritis. Continue pain control.  Goals of care conversation. Given that the patient has severe aortic stenosis with poor candidacy for any surgery, patient's prognosis is very poor in the setting of need for hemodialysis and poor p.o. intake. Hypotension episode can be life-threatening for the patient. Also mentation has not improved back to his baseline and whether this is delirium or undiagnosed dementia, or uremia prognosis remains poor. Palliative care is consulted. Discussed with wife on 7/24  Delirium. Etiology not clear. Likely in the setting of uremia and poor clearance in the setting of PD and therefore currently switched to HD. Will monitor overnight for improvement in mentation. Avoid psychotropic medications..  Subjective: Denies any acute complaint.  Admits fatigue and tired.  Poor p.o. intake.  Physical Exam: Clear to auscultation. Aortic systolic murmur. Bowel sound present. No edema. Able to follow commands, oriented x 2.  No asterixis.  Remains drowsy.  Data Reviewed: I have Reviewed nursing notes, Vitals, and Lab results. Since last encounter, pertinent lab results CBC and BMP   . I have ordered test including CBC and BMP  . I have discussed pt's care plan and test results with nephrology  .   Disposition: Status is: Inpatient Remains inpatient appropriate because: Monitor for improvement in mentation and oral intake.  heparin  injection 5,000 Units Start: 09/15/23 1615   Family Communication: Discussed with wife on the phone Level of care: Med-Surg   Vitals:   09/20/23 1600 09/20/23 1605 09/20/23 1615 09/20/23 1644  BP: 119/70  (!) 107/51 (!) 110/55  Pulse:  (!) 42  (!) 42  Resp: 12  12 18    Temp:    97.8 F (36.6 C)  TempSrc:    Oral  SpO2:  99%  98%  Weight:      Height:         Author: Yetta Blanch, MD 09/20/2023 6:00 PM  Please look on [email or web address] to find out who is on call.

## 2023-09-20 NOTE — Progress Notes (Addendum)
 Received patient in bed to unit.  Alert to self  Informed consent signed and in chart.   TX duration:3  Patient didn't tolerated well patients heart rate continued to drop down into the low 40's, patient without any new symptoms. Provider [facility] notified. Provider also notified family. No new orders.   Transported back to the room  Alert, without acute distress.  Hand-off given to patient's nurse.   Access used: catheter Access issues: NA  Total UF removed: 0 Medication(s) given: NA Post HD weight: 6.5KG   09/20/23 1556  Vitals  Temp 98.6 F (37 C)  Temp Source Axillary  BP (!) 101/47  Pulse Rate 75  Resp 14  Weight 63.5 kg  Type of Weight Post-Dialysis  Oxygen Therapy  SpO2 100 %  O2 Device Room Air  During Treatment Monitoring  Blood Flow Rate (mL/min) 400 mL/min  Arterial Pressure (mmHg) -167.26 mmHg  Venous Pressure (mmHg) 155.34 mmHg  TMP (mmHg) -2.63 mmHg  Ultrafiltration Rate (mL/min) 266 mL/min  Dialysate Flow Rate (mL/min) 300 ml/min  Duration of HD Treatment -hour(s) 2.97 hour(s)  Cumulative Fluid Removed (mL) per Treatment  -8.25  HD Safety Checks Performed Yes  Intra-Hemodialysis Comments Tx completed;Tolerated well  Post Treatment  Dialyzer Clearance Clear  Liters Processed 72  Fluid Removed (mL) 0 mL  Tolerated HD Treatment No (Comment)  Hemodialysis Catheter Right Internal jugular Double lumen Permanent (Tunneled)  Placement Date/Time: 06/26/22 0827   Serial / Lot #: [id number]  Expiration Date: 01/03/27  Time Out: Correct patient;Correct site;Correct procedure  Maximum sterile barrier precautions: Hand hygiene;Cap;Mask;Sterile gown;Sterile gloves;Large sterile ...  Site Condition No complications  Blue Lumen Status Dead end cap in place  Red Lumen Status Dead end cap in place  Catheter fill solution Heparin  1000 units/ml  Catheter fill volume (Arterial) 1.6 cc  Catheter fill volume (Venous) 1.6  Dressing Type Transparent  Dressing Status  Antimicrobial disc/dressing in place  Dressing Change Due 09/24/23  Post treatment catheter status Capped and Clamped      [facility]

## 2023-09-20 NOTE — Progress Notes (Signed)
 Informed of patient's HR intermittently dropping to as low as 39 while on HD today. No UF was not ordered. This is a change compared to earlier this week. His HR does return back to his baseline after waking him up and talking with him. I notified Mrs. Oleson and expressed to her of my concerns with patient's overall decline. Patient is not a candidate for cardiovascular intervention. Also discussed with the Hospitalist. Hospitalist has placed in the Palliative Care consult and patient's wife agrees. Patient's wife is waiting to hear when the family meeting will take place.  Charmaine Piety, NP [facility]

## 2023-09-20 NOTE — Plan of Care (Signed)
  Problem: Clinical Measurements: Goal: Will remain free from infection Outcome: Progressing   Problem: Nutrition: Goal: Adequate nutrition will be maintained Outcome: Progressing   Problem: Coping: Goal: Level of anxiety will decrease Outcome: Progressing   Problem: Elimination: Goal: Will not experience complications related to bowel motility Outcome: Progressing   Problem: Pain Managment: Goal: General experience of comfort will improve and/or be controlled Outcome: Progressing   Problem: Safety: Goal: Ability to remain free from injury will improve Outcome: Progressing   Problem: Skin Integrity: Goal: Risk for impaired skin integrity will decrease Outcome: Progressing   Problem: Fluid Volume: Goal: Ability to maintain a balanced intake and output will improve Outcome: Progressing   Problem: Skin Integrity: Goal: Risk for impaired skin integrity will decrease Outcome: Progressing

## 2023-09-20 NOTE — Evaluation (Signed)
 Physical Therapy Evaluation Patient Details Name: [name] MRN: [id number] DOB: [birth year] Today's Date: 09/20/2023  History of Present Illness  Pt is a 89 y.o. male admitted 7/19 for AMS. CT and chest x-ray negative. Labs show elevated troponin. PMH: ESRD, CAD s/p CABG, HTN, HLD, PAD, DM, prostate cancer, dementia  Clinical Impression  Pt admitted with above diagnosis. PTA pt lived at home with wife, ambulatory with rollator vs cane. Pt was undergoing daily PD at home. Transitioned to HD 7/22. Pt currently with functional limitations due to the deficits listed below (see PT Problem List). On eval, pt required +2 max assist bed mobility, and +2 mod assist sit to stand with RW. Pt unable to progress BLE for gait. Fair sitting balance and poor standing balance. Deficit also noted in activity tolerance and cognition. Generalized pain/discomfort with mobility. Pt will benefit from acute skilled PT to increase their independence and safety with mobility to allow discharge.  Upon d/c, pt would benefit from further therapy in inpatient setting < 3 hours/day. If family prefers to d/c home, pt will need 24-hour assist and below noted DME.          If plan is discharge home, recommend the following: Two people to help with walking and/or transfers;Two people to help with bathing/dressing/bathroom;Help with stairs or ramp for entrance;Assist for transportation;Assistance with cooking/housework;Supervision due to cognitive status   Can travel by private vehicle   No    Equipment Recommendations Wheelchair (measurements PT);Wheelchair cushion (measurements PT);BSC/3in1;Hospital bed (may need to consider hoyer lift)  Recommendations for Other Services       Functional Status Assessment Patient has had a recent decline in their functional status and demonstrates the ability to make significant improvements in function in a reasonable and predictable amount of time.     Precautions / Restrictions  Precautions Precautions: Fall Recall of Precautions/Restrictions: Impaired      Mobility  Bed Mobility Overal bed mobility: Needs Assistance Bed Mobility: Supine to Sit, Sit to Supine     Supine to sit: Max assist, +2 for physical assistance, HOB elevated Sit to supine: Max assist, +2 for physical assistance   General bed mobility comments: assist for all aspects of mobility    Transfers Overall transfer level: Needs assistance Equipment used: Rolling walker (2 wheels) Transfers: Sit to/from Stand Sit to Stand: +2 safety/equipment, Mod assist, From elevated surface           General transfer comment: mod assist to power up from elevated bed. Pt pulling up on RW. +2 to stabilize static stand. Posterior lean.    Ambulation/Gait               General Gait Details: unable  Stairs            Wheelchair Mobility     Tilt Bed    Modified Rankin (Stroke Patients Only)       Balance Overall balance assessment: Needs assistance Sitting-balance support: Feet supported, Bilateral upper extremity supported Sitting balance-Leahy Scale: Fair     Standing balance support: Bilateral upper extremity supported, During functional activity, Reliant on assistive device for balance Standing balance-Leahy Scale: Poor                               Pertinent Vitals/Pain Pain Assessment Pain Assessment: Faces Faces Pain Scale: Hurts little more Pain Location: generalized Pain Descriptors / Indicators: Grimacing, Sore Pain Intervention(s): Monitored during session, Repositioned, Limited activity  within patient's tolerance    Home Living Family/patient expects to be discharged to:: Private residence Living Arrangements: Spouse/significant other Available Help at Discharge: Family;Available 24 hours/day Type of Home: House Home Access: Stairs to enter Entrance Stairs-Rails: Right Entrance Stairs-Number of Steps: 8   Home Layout: One level Home  Equipment: Rollator (4 wheels);Cane - single point Additional Comments: Pt unable to provide history. Home set up taken from previous admission (2021).    Prior Function Prior Level of Function : Patient poor historian/Family not available             Mobility Comments: ambulatory in the home. Unsure of current assist level needed. Mod I as of OPPT (Jan 2024).       Extremity/Trunk Assessment   Upper Extremity Assessment Upper Extremity Assessment: Generalized weakness    Lower Extremity Assessment Lower Extremity Assessment: Generalized weakness    Cervical / Trunk Assessment Cervical / Trunk Assessment: Kyphotic  Communication   Communication Communication: Impaired Factors Affecting Communication: Hearing impaired    Cognition Arousal: Alert Behavior During Therapy: Flat affect   PT - Cognitive impairments: No family/caregiver present to determine baseline, Awareness, Problem solving, Safety/Judgement, Initiation, Attention, Sequencing                         Following commands: Impaired Following commands impaired: Follows one step commands inconsistently, Follows one step commands with increased time     Cueing Cueing Techniques: Verbal cues     General Comments General comments (skin integrity, edema, etc.): Pt transitioned from PD to HD 7/22.    Exercises     Assessment/Plan    PT Assessment Patient needs continued PT services  PT Problem List Decreased strength;Pain;Decreased cognition;Decreased activity tolerance;Decreased balance;Decreased mobility;Decreased knowledge of precautions;Decreased safety awareness       PT Treatment Interventions DME instruction;Therapeutic exercise;Gait training;Balance training;Stair training;Functional mobility training;Cognitive remediation;Therapeutic activities;Patient/family education    PT Goals (Current goals can be found in the Care Plan section)  Acute Rehab PT Goals Patient Stated Goal: unable to  state PT Goal Formulation: Patient unable to participate in goal setting Time For Goal Achievement: 10/04/23 Potential to Achieve Goals: Fair    Frequency Min 2X/week     Co-evaluation               AM-PAC PT 6 Clicks Mobility  Outcome Measure Help needed turning from your back to your side while in a flat bed without using bedrails?: A Lot Help needed moving from lying on your back to sitting on the side of a flat bed without using bedrails?: Total Help needed moving to and from a bed to a chair (including a wheelchair)?: Total Help needed standing up from a chair using your arms (e.g., wheelchair or bedside chair)?: A Lot Help needed to walk in hospital room?: Total Help needed climbing 3-5 steps with a railing? : Total 6 Click Score: 8    End of Session Equipment Utilized During Treatment: Gait belt Activity Tolerance: Patient limited by fatigue;Patient limited by pain Patient left: in bed;with call bell/phone within reach;with bed alarm set Nurse Communication: Mobility status PT Visit Diagnosis: Other abnormalities of gait and mobility (R26.89);Muscle weakness (generalized) (M62.81)    Time: 9257-9240 PT Time Calculation (min) (ACUTE ONLY): 17 min   Charges:   PT Evaluation $PT Eval Moderate Complexity: 1 Mod   PT General Charges $$ ACUTE PT VISIT: 1 Visit         Sari MATSU., PT  Office # [phone]   Erven Sari Shaker 09/20/2023, 8:41 AM

## 2023-09-20 NOTE — Progress Notes (Signed)
  [facility] Progress Note   Subjective:    Pt seen in hospital room Pt in bed, minimally interactive, soft voice confused  Objective Vitals:   09/19/23 1900 09/20/23 0257 09/20/23 0503 09/20/23 0835  BP: 135/62 139/72 (!) 140/57 (!) 156/68  Pulse: 68 71 75 71  Resp: 17 16 16 17   Temp: 97.6 F (36.4 C) 97.7 F (36.5 C) 98.2 F (36.8 C) 97.6 F (36.4 C)  TempSrc: Oral Axillary  Axillary  SpO2: 97% 100% 100% 100%  Weight:      Height:       Physical Exam General:chronically ill appearing, frail male in NAD confused Heart: RRR, +3/6 systolic murmur Lungs:CTAB, nml WOB on RA Abdomen:soft, NTND Extremities:no LE edema Dialysis Access: PD cath (malfunctioning), R[facility]    Dialysis Orders: CAPD - 5 dwells, 2L, 1.5hrs EDW 64kg  RIJ TDC, PD cath present at admission *Transitioned to IHD on 09/18/23*  Assessment/Plan: AMS - baseline dementia.  Recent peritonitis.  No signs of peritonitis here, cx's were negative from 7/19 collection. Pt has ongoing dementia.  ESRD -  PD catheter has been malfunctioning. Ongoing alarming and draining slowly. Transitioned back to [facility] on 7/22. Per pt's primary renal MD, Dr Tobie, the pt has dementia and he can no longer do PD and it is okay to take PD cath out and resume iHD.  Dialysis access: Seen by [facility], PD catheter removal is not indicated at this time given high risk of a general anesthetic.  Elevated troponins/Abnormality on recent ECHO - Cardiology consulted. Patient is deemed a poor candidate for heart catheterization and TAVR.  Hypertension/volume: on coreg  like previously. No overload on exam, at dry wt. UF 1-2 L as tolerated.  Anemia of CKD - Hgb now 9.2. Last mircera dose 5/17.   Secondary Hyperparathyroidism -  CCa ok and phos okay. Continue home meds CAD hx CABG/ severe aortic stenosis/ EF 40-45%: not candidate for TAVR or heart surgery Nutrition - Regular diet with fluid restrictions due to low K.  Myer Fret  MD   [facility] 09/20/2023, 12:27 PM   Recent Labs  Lab 09/19/23 0512 09/20/23 0544  HGB 9.2* 10.1*  ALBUMIN 1.6* 1.9*  CALCIUM  8.1* 8.2*  PHOS 5.5* 6.1*  CREATININE 8.86* 10.02*  K 3.4* 3.6    Inpatient medications:  (feeding supplement) PROSource Plus  30 mL Oral QODAY   aspirin  EC  81 mg Oral Daily   atorvastatin   80 mg Oral Daily   carvedilol   3.125 mg Oral BID WC   Chlorhexidine  Gluconate Cloth  6 each Topical Q0600   fluconazole   100 mg Oral Daily   folic acid   0.5 mg Oral Daily   gentamicin  cream  1 Application Topical Daily   heparin   5,000 Units Subcutaneous Q8H   linaclotide   145 mcg Oral QAC breakfast   pantoprazole   40 mg Oral Daily   sevelamer  carbonate  2,400 mg Oral BID WC   sodium chloride  flush  3 mL Intravenous Q12H    [START ON 09/21/2023] cefTRIAXone  (ROCEPHIN )  IV     acetaminophen  **OR** acetaminophen , alteplase , famotidine , heparin , lidocaine  (PF), lidocaine -prilocaine , melatonin, Muscle Rub, pentafluoroprop-tetrafluoroeth, polyethylene glycol

## 2023-09-21 DIAGNOSIS — G9340 Encephalopathy, unspecified: Secondary | ICD-10-CM | POA: Diagnosis not present

## 2023-09-21 DIAGNOSIS — Z7189 Other specified counseling: Secondary | ICD-10-CM

## 2023-09-21 LAB — RENAL FUNCTION PANEL
Albumin: 1.9 g/dL — ABNORMAL LOW (ref 3.5–5.0)
Anion gap: 12 (ref 5–15)
BUN: 25 mg/dL — ABNORMAL HIGH (ref 8–23)
CO2: 26 mmol/L (ref 22–32)
Calcium: 8.4 mg/dL — ABNORMAL LOW (ref 8.9–10.3)
Chloride: 97 mmol/L — ABNORMAL LOW (ref 98–111)
Creatinine, Ser: 6.42 mg/dL — ABNORMAL HIGH (ref 0.61–1.24)
GFR, Estimated: 8 mL/min — ABNORMAL LOW (ref >60)
Glucose, Bld: 144 mg/dL — ABNORMAL HIGH (ref 70–99)
Phosphorus: 4.5 mg/dL (ref 2.5–4.6)
Potassium: 3.6 mmol/L (ref 3.5–5.1)
Sodium: 135 mmol/L (ref 135–145)

## 2023-09-21 LAB — CBC
HCT: 32 % — ABNORMAL LOW (ref 39.0–52.0)
Hemoglobin: 9.8 g/dL — ABNORMAL LOW (ref 13.0–17.0)
MCH: 32.2 pg (ref 26.0–34.0)
MCHC: 30.6 g/dL (ref 30.0–36.0)
MCV: 105.3 fL — ABNORMAL HIGH (ref 80.0–100.0)
Platelets: 177 K/uL (ref 150–400)
RBC: 3.04 MIL/uL — ABNORMAL LOW (ref 4.22–5.81)
RDW: 14.9 % (ref 11.5–15.5)
WBC: 6 K/uL (ref 4.0–10.5)
nRBC: 0 % (ref 0.0–0.2)

## 2023-09-21 LAB — GLUCOSE, CAPILLARY
Glucose-Capillary: 140 mg/dL — ABNORMAL HIGH (ref 70–99)
Glucose-Capillary: 154 mg/dL — ABNORMAL HIGH (ref 70–99)

## 2023-09-21 LAB — MAGNESIUM: Magnesium: 1.9 mg/dL (ref 1.7–2.4)

## 2023-09-21 MED ORDER — BIOTENE DRY MOUTH MT LIQD
15.0000 mL | Freq: Two times a day (BID) | OROMUCOSAL | Status: DC
  Administered 2023-09-21 – 2023-09-25 (×8): 15 mL via TOPICAL

## 2023-09-21 MED ORDER — POLYVINYL ALCOHOL 1.4 % OP SOLN: 1.0000 [drp] | Freq: Four times a day (QID) | OPHTHALMIC | Status: DC | PRN

## 2023-09-21 MED ORDER — DIPHENHYDRAMINE HCL 50 MG/ML IJ SOLN: 25.0000 mg | INTRAMUSCULAR | Status: DC | PRN

## 2023-09-21 MED ORDER — HYDROMORPHONE HCL 1 MG/ML IJ SOLN
0.5000 mg | INTRAMUSCULAR | Status: DC | PRN
  Administered 2023-09-21 – 2023-09-25 (×2): 0.5 mg via INTRAVENOUS
  Filled 2023-09-21 (×2): qty 1

## 2023-09-21 MED ORDER — ONDANSETRON 4 MG PO TBDP: 4.0000 mg | ORAL_TABLET | Freq: Four times a day (QID) | ORAL | Status: DC | PRN

## 2023-09-21 MED ORDER — ONDANSETRON HCL 4 MG/2ML IJ SOLN: 4.0000 mg | Freq: Four times a day (QID) | INTRAMUSCULAR | Status: DC | PRN

## 2023-09-21 MED ORDER — GLYCOPYRROLATE 0.2 MG/ML IJ SOLN
0.2000 mg | INTRAMUSCULAR | Status: DC | PRN
  Administered 2023-09-23: 0.2 mg via INTRAVENOUS
  Filled 2023-09-21 (×2): qty 1

## 2023-09-21 MED ORDER — HALOPERIDOL LACTATE 5 MG/ML IJ SOLN: 2.0000 mg | INTRAMUSCULAR | Status: DC | PRN

## 2023-09-21 MED ORDER — LORAZEPAM 1 MG PO TABS
1.0000 mg | ORAL_TABLET | ORAL | Status: DC | PRN
Start: 2023-09-21 — End: 2023-09-25

## 2023-09-21 MED ORDER — HALOPERIDOL LACTATE 2 MG/ML PO CONC: 2.0000 mg | Freq: Four times a day (QID) | ORAL | Status: DC | PRN

## 2023-09-21 MED ORDER — HALOPERIDOL 1 MG PO TABS: 2.0000 mg | ORAL_TABLET | Freq: Four times a day (QID) | ORAL | Status: DC | PRN

## 2023-09-21 MED ORDER — GLYCOPYRROLATE 0.2 MG/ML IJ SOLN: 0.2000 mg | INTRAMUSCULAR | Status: DC | PRN

## 2023-09-21 MED ORDER — LORAZEPAM 2 MG/ML PO CONC
1.0000 mg | ORAL | Status: DC | PRN
Start: 2023-09-21 — End: 2023-09-25

## 2023-09-21 MED ORDER — GLYCOPYRROLATE 1 MG PO TABS
1.0000 mg | ORAL_TABLET | ORAL | Status: DC | PRN
Start: 2023-09-21 — End: 2023-09-25

## 2023-09-21 NOTE — Progress Notes (Signed)
 [facility] Liaison Note   Received request from [facility] for family interest in [facility]. Met with pts wife and discussed hospice philosophy and inpatient hospice care. Patient is approved for [facility] inpatient hospice care by Dr. Norleen Laurence, hospice provider.   Unfortunately, [facility] is not able to offer a bed today. Family and [facility] manager aware.   Hospital liaison will follow up tomorrow or sooner if a room becomes available.   Please do not hesitate to call with questions.   Greig Basket, BSN, RN [employer] [phone]

## 2023-09-21 NOTE — Progress Notes (Signed)
  [facility] Progress Note   Subjective:    Pt seen in hospital room Had HR in 40's yesterday during dialysis Met w/ family --> they are in agreement for comfort care/ hospice transition at this time  Objective Vitals:   09/20/23 2319 09/21/23 0449 09/21/23 0708 09/21/23 0735  BP: (!) 154/63 (!) 168/96  136/84  Pulse: 74 70  70  Resp: 18 17  16   Temp:  98.3 F (36.8 C)  98.4 F (36.9 C)  TempSrc:  Oral  Oral  SpO2: 100% 100%  100%  Weight:   63.5 kg   Height:       Physical Exam General:chronically ill appearing, frail male in NAD confused Heart: RRR, +3/6 systolic murmur Lungs:CTAB, nml WOB on RA Abdomen:soft, NTND Extremities:no LE edema Dialysis Access: PD cath (malfunctioning), RIJ TDC    Assessment/Plan: AMS - baseline dementia.  Pt has ongoing dementia.  ESRD -  PD catheter has been malfunctioning. Ongoing alarming and draining slowly. Transitioned back to [facility] on 7/22.  Dialysis access: Per [facility] PD catheter removal is too risky to do in the patient w/ untreatable severe valvular heart disease.  Elevated troponins/Abnormality on recent ECHO - Cardiology said the pt is a poor candidate for heart catheterization and TAVR.  CAD hx CABG/ severe aortic stenosis/ EF 40-45%: not candidate for TAVR or heart surgery EOL: met w/ family today and palliative care and hospitalist. Family has decided on transition to comfort care, w/ no more dialysis. Pt will likely be discharged to hospice facility soon. Will sign off.   Myer Fret  MD  [facility] 09/21/2023, 2:14 PM   Recent Labs  Lab 09/20/23 0544 09/21/23 0624  HGB 10.1* 9.8*  ALBUMIN 1.9* 1.9*  CALCIUM  8.2* 8.4*  PHOS 6.1* 4.5  CREATININE 10.02* 6.42*  K 3.6 3.6    Inpatient medications:  antiseptic oral rinse  15 mL Topical BID   Chlorhexidine  Gluconate Cloth  6 each Topical Q0600   fluconazole   100 mg Oral Daily   gentamicin  cream  1 Application Topical Daily   linaclotide   145 mcg Oral QAC breakfast    pantoprazole   40 mg Oral Daily   sodium chloride  flush  3 mL Intravenous Q12H     acetaminophen  **OR** acetaminophen , artificial tears, diphenhydrAMINE, famotidine , glycopyrrolate **OR** glycopyrrolate **OR** glycopyrrolate, haloperidol  **OR** haloperidol  **OR** haloperidol  lactate, HYDROmorphone (DILAUDID) injection, LORazepam **OR** LORazepam, Muscle Rub, ondansetron  **OR** ondansetron  (ZOFRAN ) IV, polyethylene glycol

## 2023-09-21 NOTE — TOC Progression Note (Signed)
 Transition of Care [facility] - Progression Note    Patient Details  Name: [name] MRN: [id number] Date of Birth: [birth year]  Transition of Care [facility]) CM/SW Contact  Gwenn Frieze Bryce, [place] Phone Number: 09/21/2023, 2:31 PM  Clinical Narrative: Per Palliative Care APP, pt's family agreeable to comfort measures and requesting hospice home placement at [facility]. Referral made to Shawn with [facility]. SW will follow.   Frieze Gwenn, MSW, LCSW [phone] (coverage)        Expected Discharge Plan: Home/Self Care Barriers to Discharge: Continued Medical Work up               Expected Discharge Plan and Services       Living arrangements for the past 2 months: Single Family Home                                       Social Drivers of Health (SDOH) Interventions SDOH Screenings   Food Insecurity: Unknown (09/15/2023)  Housing: Unknown (09/15/2023)  Transportation Needs: Patient Unable To Answer (09/18/2023)  Utilities: Patient Unable To Answer (09/18/2023)  Depression (PHQ2-9): Low Risk  (07/13/2020)  Physical Activity: Sufficiently Active (10/31/2019)  Social Connections: Patient Unable To Answer (09/18/2023)  Stress: No Stress Concern Present (06/29/2023)   Received from [facility]  Tobacco Use: Medium Risk (09/15/2023)    Readmission Risk Interventions    06/27/2022    3:51 PM  Readmission Risk Prevention Plan  Transportation Screening Complete  PCP or Specialist Appt within 5-7 Days Complete  Home Care Screening Complete  Medication Review (RN CM) Referral to Pharmacy

## 2023-09-21 NOTE — Progress Notes (Addendum)
 PT Cancellation Note  Patient Details Name: [name] MRN: [id number] DOB: November 25, 1933   Cancelled Treatment:    Reason Eval/Treat Not Completed: (P) Other (comment), per chart review and RN, pt transitioning to comfort care, will hold therapies at this time. Please re-consult as appropriate.  Therisa SAUNDERS. PTA Acute Rehabilitation Services Office: [phone]    Therisa CHRISTELLA Boor 09/21/2023, 1:36 PM

## 2023-09-21 NOTE — Consult Note (Signed)
 Palliative Care Consult Note                                  Date: 09/21/2023   Patient Name: [name]  DOB: May 29, 1933  MRN: [id number]  Age / Sex: 89 y.o., male  PCP: Corlis Pagan, NP Referring Physician: Tobie Yetta HERO, MD  Reason for Consultation: Establishing goals of care  HPI/Patient Profile: 89 y.o. male  with past medical history of HTN, ESRD on PD, dementia, CAD s/p CABG, moderate aortic stenosis, chronic HFpEF  admitted on 09/15/2023 after increased confusion, poor oral intake, and nausea/vomiting.   Cardiology saw patient. There are no good options for revascularization of occlusion of Lcx. Recommend medial management. Patient has low flow low gradient severe aortic stenosis. He is not a redo sternotomy candidate and likely not a TAVR candidate.  Concern for PD catheter malfunctioning with poor drainage. Patient transitioned to HD. Vascular assessed patient and do not recommend PD catheter removal due to patient's comorbidities and high risk for any procedures.   Palliative care consulted for goals of care.  Past Medical History:  Diagnosis Date   Acute coronary syndrome (HCC) 04/18/2019   AMS (altered mental status) 03/07/2023   Aortic stenosis 02/27/2020   mild to moderate AS   Arthritis    a little in LLE (06/20/2017)   Ataxia    Chronic diastolic CHF (congestive heart failure) (HCC)    Coronary artery disease    a. s/p CABG 1980s. b. stable by cath 05/2017.   Dizziness    Dyslipidemia    Edema 08/08/2017   HANDS & LOWER EXTREMITES   ESRD on hemodialysis (HCC)    Hypertension    Hypoalbuminemia    Nephrotic range proteinuria    Peripheral artery disease (HCC)    mild   Pneumonia ~ 1950 X 1   Prostate cancer (HCC) ~ 2015   Type II diabetes mellitus (HCC)     Subjective:   I have reviewed medical records including EPIC notes, labs and imaging, received updates from nursing and attending provider,  assessed the patient and then met with the patient's  to discuss diagnosis prognosis, GOC, EOL wishes, disposition and options.  I introduced Palliative Medicine as specialized medical care for people living with serious illness. It focuses on providing relief from symptoms and stress of a serious illness. The goal is to improve quality of life for both the patient and the family.  Today's Discussion: Met at bedside with patient's family including his wife [name]. Dr. Tobie and Dr. Geralynn gave medical updates. We discussed the patient's heart failure, severe aortic stenosis, and end stage kidney disease. We discussed how the patient's heart and heart valves were not strong enough to support dialysis.   The patient's family asked about next steps and we discussed the option to transition from an aggressive medical intervention path to a comfort focussed path. We discussed that once on a comfort focussed path the patient would no longer receive aggressive medical interventions such as continuous vital signs, lab work, radiology testing, or medications not focused on comfort. All care would focus on how the patient is looking and feeling. This would include management of any symptoms that may cause discomfort, pain, shortness of breath, cough, nausea, agitation, anxiety, and/or secretions etc. Symptoms would be managed with medications and other non-pharmacological interventions. Family verbalized understanding and appreciation. They would like to transition to comfort measures  now- no further dialysis.  We discussed potential locations for end of life care. Patient's family prefers inpatient hospice at [facility]. We discussed the patient would be evaluated for BP. We will keep the patient comfortable here until he is accepted to and transferred to BP.   Prior to admission the patient lived at home with his wife. They have a daughter in [place]. The patient has always been active and loved being outside  before his dementia and mobility prevented it. His family describes him as easy going. The family share their main concern at this time is that he not suffer.  Discussed the importance of continued conversation with family and the medical providers regarding overall plan of care and treatment options, ensuring decisions are within the context of the patient's values and GOCs.  Questions and concerns were addressed. Hard Choices booklet left for review. The family was encouraged to call with questions or concerns. PMT will continue to support holistically.  Review of Systems  Unable to perform ROS   Objective:   Primary Diagnoses: Present on Admission:  PAD (peripheral artery disease) (HCC)  Hypertension  Essential hypertension  Dyslipidemia  DNR (do not resuscitate)  Coronary artery disease  Acute encephalopathy  AKI (acute kidney injury) (HCC)   Physical Exam Vitals reviewed.  Constitutional:      General: He is sleeping.     Appearance: He is not ill-appearing.  HENT:     Head: Normocephalic and atraumatic.  Cardiovascular:     Rate and Rhythm: Normal rate.  Pulmonary:     Effort: Pulmonary effort is normal.  Skin:    General: Skin is warm and dry.  Neurological:     Mental Status: He is easily aroused. He is disoriented.     Vital Signs:  BP 136/84 (BP Location: Left Arm)   Pulse 70   Temp 98.4 F (36.9 C) (Oral)   Resp 16   Ht 5' 11 (1.803 m)   Wt 63.5 kg   SpO2 100%   BMI 19.52 kg/m     Advanced Care Planning:   Existing Vynca/ACP Documentation: None  Primary Decision Maker: NEXT OF KIN. Patient's wife [name].  Code Status/Advance Care Planning: DNR   Decisions/Changes to ACP: Transitioned to full comfort measures  Assessment & Plan:   SUMMARY OF RECOMMENDATIONS   Continue DNR Start full comfort measures Evaluation for [facility]- TOC order placed Continued PMT support  Symptom Management Dilaudid PRN for pain/air  hunger/comfort Robinul PRN for excessive secretions Ativan PRN for agitation/anxiety Zofran  PRN for nausea Liquifilm tears PRN for dry eyes Haldol  PRN for agitation/anxiety May have comfort feeding Comfort cart for family Unrestricted visitations in the setting of EOL (per policy) Oxygen PRN 2L or less for comfort. No escalation.    Discussed with: Dr. Tobie, Dr. Geralynn, bedside RN, ACC liaision, and Josie Barnes LCSW  Time Total: 75 minutes    Thank you for allowing us  to participate in the care of [name] PMT will continue to support holistically.  Signed by: Stephane Palin, NP Palliative Medicine Team  Team Phone # [phone] (Nights/Weekends)  09/21/2023, 9:21 AM

## 2023-09-21 NOTE — Progress Notes (Signed)
 Chart reviewed and plan for comfort care noted. Contacted [facility] to make aware of this info.   Randine Mungo Dialysis Navigator [phone]

## 2023-09-21 NOTE — Progress Notes (Signed)
 [facility] Progress Note [name] FMW:[id number] DOB: [birth year] DOA: 09/15/2023  DOS: the patient was seen and examined on 09/21/2023  Brief Hospital Course: Patient with PMH of HTN, ESRD on PD, dementia, CAD SP CABG, moderate aortic stenosis, chronic HFpEF presented to the hospital with complaints of confusion as well as poor p.o. intake and nausea and vomiting. Was being transition to HD. Now comfort care.  Assessment and Plan: Acute metabolic encephalopathy in the setting of uremia. Baseline dementia. Mentation appears to be improving although still somewhat confused. Per family.  Close to baseline unknown time of evaluation on 7/23. CT head negative for any acute abnormality. Currently receiving IV ceftriaxone  for possible peritonitis although cultures are negative. Code stroke was called on 7/22 morning.  Although later on consult. Now comfort care.  Nausea and diarrhea. Currently resolved. Able to tolerate oral diet although minimal oral intake.  ESRD. Was on PD.  Nephrology was consulted. There is concern for PD catheter malfunctioning with poor drainage. Nephrology discussed with patient's primary nephrologist and plan is to switch to IHD. [facility] was consulted for PD removal although they feel that the patient is too high risk for any procedure and therefore would not recommend PD removal unless there is any other significant indication. Now comfort care.  Elevated troponin. CAD with prior history of CABG. Chronic systolic and diastolic CHF. PAD Low-flow low gradient severe aortic stenosis. EF 40 to 45%. Per cardiology, no good option for revascularization for CAD and recommended medical management based on prior workup. Elevated troponin mostly demand ischemia rather than acute ACS. Continue Coreg , aspirin  and Lipitor . GDMT limited secondary to ESRD and hypotension. Not a candidate for redo sternotomy and most likely at a high risk for TAVR. Medical  management recommended. Outpatient follow-up with Dr. Hilty/cardiology. Now comfort care.  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  Uncontrolled with hyper and hypoglycemia. Currently on sliding scale insulin . Now comfort care.  Left shoulder pain. x-ray shows arthritis. Continue pain control.  Goals of care conversation. Given that the patient has severe aortic stenosis with poor candidacy for any surgery, patient's prognosis is very poor in the setting of need for hemodialysis and poor p.o. intake. Hypotension episode can be life-threatening for the patient. Also mentation has not improved back to his baseline and whether this is delirium or undiagnosed dementia, or uremia prognosis remains poor. Palliative care is consulted. Family meeting on 7/25.  Patient's prognosis is very poor in the setting of severe aortic stenosis and inability to tolerate hemodialysis.  Family understand the risk and agrees to transition to comfort care in the hospital and eventual discharge to residential hospice.  Delirium. Etiology not clear. Likely in the setting of uremia and poor clearance in the setting of PD and therefore currently switched to HD. Now comfort care.   Subjective: No nausea or vomiting.  Remains confused.  Denies any acute complaint but continues to have pain in his shoulder.  Physical Exam: Clear to auscultation. Aortic systolic murmur heard. Bowel sound present. No edema. Alert and awake, oriented to self.  Data Reviewed: I have Reviewed nursing notes, Vitals, and Lab results. Since last encounter, pertinent lab results CBC and BMP   . I have ordered test including none  . I have discussed pt's care plan and test results with palliative care and nephrology  .   Disposition: Status is: Inpatient Remains inpatient appropriate because: Monitor for arrangement of hospice   Family Communication: Family at bedside Level  of care: Med-Surg   Vitals:    09/20/23 2319 09/21/23 0449 09/21/23 0708 09/21/23 0735  BP: (!) 154/63 (!) 168/96  136/84  Pulse: 74 70  70  Resp: 18 17  16   Temp:  98.3 F (36.8 C)  98.4 F (36.9 C)  TempSrc:  Oral  Oral  SpO2: 100% 100%  100%  Weight:   63.5 kg   Height:         Author: Yetta Blanch, MD 09/21/2023 6:37 PM  Please look on [email or web address] to find out who is on call.

## 2023-09-22 DIAGNOSIS — Z515 Encounter for palliative care: Secondary | ICD-10-CM | POA: Diagnosis not present

## 2023-09-22 DIAGNOSIS — G9340 Encephalopathy, unspecified: Secondary | ICD-10-CM | POA: Diagnosis not present

## 2023-09-22 NOTE — Progress Notes (Signed)
 Daily Progress Note   Patient Name: [name]       Date: 09/22/2023 DOB: 01/02/1934  Age: 89 y.o. MRN#: [id number] Attending Physician: Tobie Yetta HERO, MD Primary Care Physician: Nathaniel Pagan, NP Admit Date: 09/15/2023  Reason for Consultation/Follow-up: Establishing goals of care  Length of Stay: 6  Current Medications: Scheduled Meds:   antiseptic oral rinse  15 mL Topical BID   Chlorhexidine  Gluconate Cloth  6 each Topical Q0600   fluconazole   100 mg Oral Daily   gentamicin  cream  1 Application Topical Daily   linaclotide   145 mcg Oral QAC breakfast   pantoprazole   40 mg Oral Daily   sodium chloride  flush  3 mL Intravenous Q12H    Continuous Infusions:   PRN Meds: acetaminophen  **OR** acetaminophen , artificial tears, diphenhydrAMINE , famotidine , glycopyrrolate  **OR** glycopyrrolate  **OR** glycopyrrolate , haloperidol  **OR** haloperidol  **OR** haloperidol  lactate, HYDROmorphone  (DILAUDID ) injection, LORazepam  **OR** LORazepam , Muscle Rub, ondansetron  **OR** ondansetron  (ZOFRAN ) IV, polyethylene glycol  Physical Exam Constitutional:      General: He is sleeping. He is not in acute distress.    Appearance: He is ill-appearing.  HENT:     Head: Atraumatic.  Pulmonary:     Effort: Pulmonary effort is normal.             Vital Signs: BP (!) 177/85 (BP Location: Left Arm)   Pulse 73   Temp 98.4 F (36.9 C) (Oral)   Resp 18   Ht 5' 11 (1.803 m)   Wt 63.5 kg   SpO2 100%   BMI 19.52 kg/m  SpO2: SpO2: 100 % O2 Device: O2 Device: Room Air O2 Flow Rate:      Patient Active Problem List   Diagnosis Date Noted   Nonrheumatic aortic valve stenosis 09/18/2023   AKI (acute kidney injury) (HCC) 09/16/2023   Acute encephalopathy 09/15/2023   Peritoneal dialysis  catheter dysfunction (HCC) 06/22/2022   DNR (do not resuscitate) 06/22/2022   Hypokalemia    ESRD on peritoneal dialysis (HCC)    Nephrotic range proteinuria    Difficulty urinating    SOB (shortness of breath) on exertion 06/20/2017   Other fatigue 06/19/2017   Congestive heart failure (HCC) 02/28/2017   Hypertension    Coronary artery disease    Moderate aortic stenosis by prior echocardiogram - mean gradient 24.3 mmHg.  peak gradient 42.9 mmHg. Aortic valve area, by VTI measures 1.15  cm. 10/16/2016   Uncontrolled hypersomnia 10/07/2015   Murmur 09/12/2013   Dizziness 08/22/2012   Hx of CABG 08/22/2012   Essential hypertension 08/22/2012   Dyslipidemia 08/22/2012   PAD (peripheral artery disease) (HCC) 08/22/2012   DM2 (diabetes mellitus, type 2) (HCC) 08/22/2012    Palliative Care Assessment & Plan   Patient Profile: 89 y.o. male  with past medical history of HTN, ESRD on PD, dementia, CAD s/p CABG, moderate aortic stenosis, chronic HFpEF  admitted on 09/15/2023 after increased confusion, poor oral intake, and nausea/vomiting.    Cardiology saw patient. There are no good options for revascularization of occlusion of Lcx. Recommend medial management. Patient has low flow low gradient severe aortic stenosis. He is not a redo sternotomy candidate and likely not a TAVR candidate.   Concern for PD catheter malfunctioning with poor drainage. Patient transitioned to HD. Vascular assessed patient and do not recommend PD catheter removal due to patient's comorbidities and high risk for any procedures.   Today's Discussion: Reviewed chart. Patient was transitioned to full comfort measures yesterday. He received one dose of dilaudid  yesterday. He is approved for [facility] but is waiting for an available bed. Patient sleeping in no apparent distress.  He looks comfortable and does not easily awaken.  No family at bedside.  10:15: Spoke to patient's wife [name] by phone.  We discussed the  patient looks comfortable and only required a dose of Dilaudid  in the last 24 hours.  Discussed the plan for him to discharge to [facility] when a bed becomes available.  We will keep him comfortable here until that time. Encouraged [name] to call PMT with questions or needs.  Recommendations/Plan: Full comfort care Medications per [facility] Transfer to [facility] when a bed is available Continued PMT support   Code Status:    Code Status Orders  (From admission, onward)           Start     Ordered   09/21/23 1230  Do not attempt resuscitation (DNR) - Comfort care  Continuous       Question Answer Comment  If patient has no pulse and is not breathing Do Not Attempt Resuscitation   In Pre-Arrest Conditions (Patient Is Breathing and Has a Pulse) Provide comfort measures. Relieve any mechanical airway obstruction. Avoid transfer unless required for comfort.   Consent: Discussion documented in EHR or advanced directives reviewed      09/21/23 1230         Extensive chart review has been completed prior to seeing the patient including progress/consult notes, orders, medications, and available advance directive documents.  Care plan was discussed with [name] [facility] liaison  Time spent: 25 minutes  Thank you for allowing the Palliative Medicine Team to assist in the care of this patient.   Stephane CHRISTELLA Palin, NP  Please contact Palliative Medicine Team phone at [phone] for questions and concerns.

## 2023-09-22 NOTE — Progress Notes (Signed)
 [facility] [address] [facility] Liaison Note   Unfortunately, [facility] is unable to offer a bed today.  TOC aware that liaison will follow up tomorrow or sooner if a bed becomes available.    Thank you for allowing us  to participate in this patient's care.   Nat Babe, BSN, RN [employer] [phone]

## 2023-09-22 NOTE — Progress Notes (Signed)
 [facility] Progress Note [name] FMW:[id number] DOB: [birth year] DOA: 09/15/2023  DOS: the patient was seen and examined on 09/22/2023  Brief Hospital Course: Patient with PMH of HTN, ESRD on PD, dementia, CAD SP CABG, moderate aortic stenosis, chronic HFpEF presented to the hospital with complaints of confusion as well as poor p.o. intake and nausea and vomiting. Was being transition to HD. Now comfort care.  Assessment and Plan: Acute metabolic encephalopathy in the setting of uremia. Baseline dementia. CT head negative for any acute abnormality. Code stroke was called on 7/22 morning.  Although later on canceled.  Mentation initially waxing and waning and now progressively worsening. Now comfort care.  ESRD Was on PD.  Nephrology was consulted. There is concern for PD catheter malfunctioning with poor drainage. Nephrology discussed with patient's primary nephrologist and plan was to switch to IHD. [facility] was consulted for PD removal although they feel that the patient is too high risk for any procedure and therefore would not recommend PD removal unless there is any other significant indication. Unable to tolerate HD with bradycardic events in the setting of CAD and severe aortic stenosis. Now comfort care.  CAD with prior history of CABG. Non-STEMI-demand ischemia Chronic systolic and diastolic CHF. PAD Low-flow low gradient severe aortic stenosis. EF 40 to 45%. Per cardiology, cath in December 2024, no good option for revascularization for CAD and recommended medical management based on prior workup. Graft to OM is closed and known proximal LCx occlusion. Troponin peaked at 7244.  Trending down GDMT limited secondary to ESRD and hypotension. Also has severe aortic stenosis with low flow. Not a candidate for redo sternotomy and most likely at a high risk for TAVR. Medical management recommended. Outpatient follow-up with Dr. Hilty/cardiology. Now comfort  care.  Nausea and diarrhea. Currently resolved. Able to tolerate oral diet although minimal oral intake.  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  Uncontrolled with hyper and hypoglycemia. Now comfort care.  Left shoulder pain. x-ray shows arthritis. Continue pain control.  Goals of care conversation. Given that the patient has severe aortic stenosis with poor candidacy for any surgery, patient's prognosis is very poor in the setting of need for hemodialysis and poor p.o. intake. Hypotension episode can be life-threatening for the patient. Also mentation has not improved back to his baseline and whether this is delirium or undiagnosed dementia, or uremia prognosis remains poor. Palliative care is consulted. Family meeting on 7/25.  Patient's prognosis is very poor in the setting of severe aortic stenosis and inability to tolerate hemodialysis.  Family understand the risk and agrees to transition to comfort care in the hospital and eventual discharge to residential hospice.  Delirium. Etiology not clear. Likely in the setting of uremia and poor clearance in the setting of PD and therefore currently switched to HD. Now comfort care.   Subjective: Confused.  Unable to follow commands.  Physical Exam: Clear to auscultation. Bowel sound present Unable to follow commands.  Not answering questions today.  Although alert.  Data Reviewed: I have Reviewed nursing notes, Vitals, and Lab results.  Disposition: Status is: Inpatient Remains inpatient appropriate because: Awaiting placement at [facility].  Family Communication: No one at bedside Level of care: Med-Surg   Vitals:   09/21/23 0708 09/21/23 0735 09/21/23 1940 09/22/23 0516  BP:  136/84 (!) 178/81 (!) 177/85  Pulse:  70 67 73  Resp:  16 18 18   Temp:  98.4 F (36.9 C)    TempSrc:  Oral    SpO2:  100% 100% 100%  Weight: 63.5 kg     Height:         Author: Yetta Blanch, MD 09/22/2023 5:03  PM  Please look on [email or web address] to find out who is on call.

## 2023-09-23 DIAGNOSIS — G9340 Encephalopathy, unspecified: Secondary | ICD-10-CM | POA: Diagnosis not present

## 2023-09-23 NOTE — TOC Progression Note (Signed)
 Transition of Care [facility]) - Progression Note    Patient Details  Name: [name] MRN: [id number] Date of Birth: [birth year]  [facility] CM/SW Contact  Bridget Cordella Simmonds, LCSW Phone Number: 09/23/2023, 11:10 AM  Clinical Narrative:   Message from Nicole/[facility]: no beds available today.     Expected Discharge Plan: Home/Self Care Barriers to Discharge: Continued Medical Work up               Expected Discharge Plan and Services       Living arrangements for the past 2 months: Single Family Home                                       Social Drivers of Health (SDOH) Interventions SDOH Screenings   Food Insecurity: Unknown (09/15/2023)  Housing: Unknown (09/15/2023)  Transportation Needs: Patient Unable To Answer (09/18/2023)  Utilities: Patient Unable To Answer (09/18/2023)  Depression (PHQ2-9): Low Risk  (07/13/2020)  Physical Activity: Sufficiently Active (10/31/2019)  Social Connections: Patient Unable To Answer (09/18/2023)  Stress: No Stress Concern Present (06/29/2023)   Received from [facility]  Tobacco Use: Medium Risk (09/15/2023)    Readmission Risk Interventions    06/27/2022    3:51 PM  Readmission Risk Prevention Plan  Transportation Screening Complete  PCP or Specialist Appt within 5-7 Days Complete  Home Care Screening Complete  Medication Review (RN CM) Referral to Pharmacy

## 2023-09-23 NOTE — Progress Notes (Signed)
 Daily Progress Note   Patient Name: [name]       Date: 09/23/2023 DOB: 25-Aug-1933  Age: 89 y.o. MRN#: [id number] Attending Physician: Nathaniel Yetta HERO, MD Primary Care Physician: Nathaniel Pagan, NP Admit Date: 09/15/2023  Reason for Consultation/Follow-up: Establishing goals of care  Length of Stay: 7  Current Medications: Scheduled Meds:   antiseptic oral rinse  15 mL Topical BID   Chlorhexidine  Gluconate Cloth  6 each Topical Q0600   gentamicin  cream  1 Application Topical Daily   linaclotide   145 mcg Oral QAC breakfast   pantoprazole   40 mg Oral Daily   sodium chloride  flush  3 mL Intravenous Q12H    Continuous Infusions:   PRN Meds: acetaminophen  **OR** acetaminophen , artificial tears, diphenhydrAMINE , glycopyrrolate  **OR** glycopyrrolate  **OR** glycopyrrolate , haloperidol  **OR** haloperidol  **OR** haloperidol  lactate, HYDROmorphone  (DILAUDID ) injection, LORazepam  **OR** LORazepam , Muscle Rub, ondansetron  **OR** ondansetron  (ZOFRAN ) IV, polyethylene glycol  Physical Exam Constitutional:      General: He is sleeping. He is not in acute distress.    Appearance: He is ill-appearing.  HENT:     Head: Atraumatic.  Pulmonary:     Effort: Pulmonary effort is normal.  Skin:    General: Skin is dry.             Vital Signs: BP 128/85 (BP Location: Right Arm)   Pulse 71   Temp (!) 97.5 F (36.4 C) (Oral)   Resp 15   Ht 5' 11 (1.803 m)   Wt 63.5 kg   SpO2 100%   BMI 19.52 kg/m  SpO2: SpO2: 100 % O2 Device: O2 Device: Room Air O2 Flow Rate:      Patient Active Problem List   Diagnosis Date Noted   Nonrheumatic aortic valve stenosis 09/18/2023   AKI (acute kidney injury) (HCC) 09/16/2023   Acute encephalopathy 09/15/2023   Peritoneal dialysis catheter  dysfunction (HCC) 06/22/2022   DNR (do not resuscitate) 06/22/2022   Hypokalemia    ESRD on peritoneal dialysis (HCC)    Nephrotic range proteinuria    Difficulty urinating    SOB (shortness of breath) on exertion 06/20/2017   Other fatigue 06/19/2017   Congestive heart failure (HCC) 02/28/2017   Hypertension    Coronary artery disease    Moderate aortic stenosis by prior echocardiogram - mean gradient 24.3 mmHg.  peak gradient 42.9 mmHg. Aortic valve area, by VTI measures 1.15  cm. 10/16/2016   Uncontrolled hypersomnia 10/07/2015   Murmur 09/12/2013   Dizziness 08/22/2012   Hx of CABG 08/22/2012   Essential hypertension 08/22/2012   Dyslipidemia 08/22/2012   PAD (peripheral artery disease) (HCC) 08/22/2012   DM2 (diabetes mellitus, type 2) (HCC) 08/22/2012    Palliative Care Assessment & Plan   Patient Profile: 89 y.o. male  with past medical history of HTN, ESRD on PD, dementia, CAD s/p CABG, moderate aortic stenosis, chronic HFpEF  admitted on 09/15/2023 after increased confusion, poor oral intake, and nausea/vomiting.    Cardiology saw patient. There are no good options for revascularization of occlusion of Lcx. Recommend medial management. Patient has low flow low gradient severe aortic stenosis. He is not a redo sternotomy candidate and likely not a TAVR candidate.   Concern for PD catheter malfunctioning with poor drainage. Patient transitioned to HD. Vascular assessed patient and do not recommend PD catheter removal due to patient's comorbidities and high risk for any procedures.   Today's Discussion: Reviewed chart. Patient on full comfort measures. He did not require as needed medications over the last 24 hours. He is approved for [facility] but is waiting for an available bed. Patient sleeping in no apparent distress.  He looks comfortable and does not easily awaken.  Requested robinul  from nursing for secretions. No family at bedside.   Recommendations/Plan: Full  comfort care Medications per [facility] Transfer to [facility] when a bed is available Continued PMT support   Code Status:    Code Status Orders  (From admission, onward)           Start     Ordered   09/21/23 1230  Do not attempt resuscitation (DNR) - Comfort care  Continuous       Question Answer Comment  If patient has no pulse and is not breathing Do Not Attempt Resuscitation   In Pre-Arrest Conditions (Patient Is Breathing and Has a Pulse) Provide comfort measures. Relieve any mechanical airway obstruction. Avoid transfer unless required for comfort.   Consent: Discussion documented in EHR or advanced directives reviewed      09/21/23 1230         Extensive chart review has been completed prior to seeing the patient including progress/consult notes, orders, medications, and available advance directive documents.  Care plan was discussed with bedside RN  Time spent: 25 minutes  Thank you for allowing the Palliative Medicine Team to assist in the care of this patient.   Nathaniel CHRISTELLA Palin, NP  Please contact Palliative Medicine Team phone at [phone] for questions and concerns.

## 2023-09-23 NOTE — Plan of Care (Signed)
   Problem: Education: Goal: Knowledge of General Education information will improve Description Including pain rating scale, medication(s)/side effects and non-pharmacologic comfort measures Outcome: Progressing   Problem: Health Behavior/Discharge Planning: Goal: Ability to manage health-related needs will improve Outcome: Progressing

## 2023-09-23 NOTE — Progress Notes (Signed)
 [facility] PROGRESS NOTE  [name] FMW:[id number]   PCP: Corlis Pagan, NP DOB: 29-Sep-1933   DOA: 09/15/2023   DOS: 09/23/2023    Subjective: No acute events overnight was sleepy at the time of my evaluation.  Appeared comfortable.  Objective:  Vitals:   09/21/23 1940 09/22/23 0516 09/22/23 2001 09/23/23 0739  BP: (!) 178/81 (!) 177/85 (!) 175/81 128/85  Pulse: 67 73 75 71  Resp: 18 18 18 15   Temp:    (!) 97.5 F (36.4 C)  TempSrc:    Oral  SpO2: 100% 100% 100% 100%  Weight:      Height:       Clear to auscultation.  No regurgitation. No edema. . Assessment and plan: Comfort care in the setting of ESRD and aortic stenosis. Continue comfort medication.  Awaiting placement for [facility].  Author: Yetta Blanch, MD [facility] 09/23/2023 7:24 PM   If 7PM-7AM, please contact night-coverage at [email or web address]

## 2023-09-24 DIAGNOSIS — G9340 Encephalopathy, unspecified: Secondary | ICD-10-CM | POA: Diagnosis not present

## 2023-09-24 NOTE — Progress Notes (Signed)
 [facility] PROGRESS NOTE  [name] FMW:[id number]   PCP: Corlis Pagan, NP DOB: 01/07/1934   DOA: 09/15/2023   DOS: 09/24/2023    Subjective: No acute events overnight.  No nausea, does not appear to be in any distress right now.  Objective:  Vitals:   09/22/23 2001 09/23/23 0739 09/23/23 1958 09/24/23 0430  BP: (!) 175/81 128/85 (!) 169/73 (!) 151/102  Pulse: 75 71 76 81  Resp: 18 15 16 18   Temp:  (!) 97.5 F (36.4 C) 97.9 F (36.6 C) 98.2 F (36.8 C)  TempSrc:  Oral Oral Oral  SpO2: 100% 100% 93% 99%  Weight:      Height:       Clear to auscultation.  No crackles. Assessment and plan: ESRD on HD. Severe aortic stenosis. CAD with elevated troponin. Continue comfort care. Awaiting placement at [facility].  Author: Yetta Blanch, MD [facility] 09/24/2023 5:00 PM   If 7PM-7AM, please contact night-coverage at [email or web address]

## 2023-09-24 NOTE — Plan of Care (Signed)
   Problem: Education: Goal: Knowledge of General Education information will improve Description Including pain rating scale, medication(s)/side effects and non-pharmacologic comfort measures Outcome: Progressing

## 2023-09-24 NOTE — Progress Notes (Signed)
 [employer] H[facility] hospital liaison note   Received request from [facility] for family interest in [facility]    Chart reviewed and [facility] eligibility is pending at this time.    Unfortunately [facility] does not have a bed to offer today. TOC is aware hospital liaison will follow up tomorrow or sooner if room becomes available.    Please do not hesitate to call with any hospice related questions or concerns.    Thank you for the opportunity to participate in this patient's care.   Greig Basket  BSN, [facility] Liaison  [phone]

## 2023-09-24 NOTE — Plan of Care (Signed)
  Problem: Education: Goal: Knowledge of General Education information will improve Description: Including pain rating scale, medication(s)/side effects and non-pharmacologic comfort measures Outcome: Progressing   Problem: Clinical Measurements: Goal: Ability to maintain clinical measurements within normal limits will improve Outcome: Progressing   Problem: Nutrition: Goal: Adequate nutrition will be maintained Outcome: Progressing   Problem: Pain Managment: Goal: General experience of comfort will improve and/or be controlled Outcome: Progressing   Problem: Coping: Goal: Ability to adjust to condition or change in health will improve Outcome: Progressing

## 2023-09-24 NOTE — TOC Progression Note (Addendum)
 Transition of Care [facility]) - Progression Note    Patient Details  Name: [name] MRN: [id number] Date of Birth: 1933-04-23  Transition of Care [facility]) CM/SW Contact  Bridget Cordella Simmonds, LCSW Phone Number: 09/24/2023, 11:52 AM  Clinical Narrative:  Message left with [facility] regarding available beds at [facility].    1200: message from Amy/[facility]: no beds available today.   Expected Discharge Plan: Home/Self Care Barriers to Discharge: Continued Medical Work up               Expected Discharge Plan and Services       Living arrangements for the past 2 months: Single Family Home                                       Social Drivers of Health (SDOH) Interventions SDOH Screenings   Food Insecurity: Unknown (09/15/2023)  Housing: Unknown (09/15/2023)  Transportation Needs: Patient Unable To Answer (09/18/2023)  Utilities: Patient Unable To Answer (09/18/2023)  Depression (PHQ2-9): Low Risk  (07/13/2020)  Physical Activity: Sufficiently Active (10/31/2019)  Social Connections: Patient Unable To Answer (09/18/2023)  Stress: No Stress Concern Present (06/29/2023)   Received from [facility]  Tobacco Use: Medium Risk (09/15/2023)    Readmission Risk Interventions    06/27/2022    3:51 PM  Readmission Risk Prevention Plan  Transportation Screening Complete  PCP or Specialist Appt within 5-7 Days Complete  Home Care Screening Complete  Medication Review (RN CM) Referral to Pharmacy

## 2023-09-24 NOTE — Progress Notes (Signed)
 Palliative:  HPI: 89 y.o. male  with past medical history of HTN, ESRD on PD, dementia, CAD s/p CABG, moderate aortic stenosis, chronic HFpEF  admitted on 09/15/2023 after increased confusion, poor oral intake, and nausea/vomiting. Cardiology saw patient. There are no good options for revascularization of occlusion of Lcx. Recommend medial management. Patient has low flow low gradient severe aortic stenosis. He is not a redo sternotomy candidate and likely not a TAVR candidate. Concern for PD catheter malfunctioning with poor drainage. Patient transitioned to HD. Vascular assessed patient and do not recommend PD catheter removal due to patient's comorbidities and high risk for any procedures. Transition to full comfort care beginning 09/21/23.   I have review chart, vital signs, notes, med list, and PRN meds. I met today at [name]'s bedside although no family present. He is resting comfortably. He does no respond to voice or exam. He is snoring softly. His breathing is regular, unlabored. Abd soft. Warm to touch. No distress and not requiring many recent PRN medications. Noted that he has been approved for [facility] but bed has not been available so far. Will continue to maintain comfort here in the hospital.   Plan: - Full comfort care - Awaiting [facility] bed - No changes to comfort med regimen  25 min  Bernarda Kitty, NP Palliative Medicine Team Pager [phone] (Please see [email or web address] for schedule) Team Phone [phone]

## 2023-09-25 DIAGNOSIS — G9340 Encephalopathy, unspecified: Secondary | ICD-10-CM | POA: Diagnosis not present

## 2023-09-25 DIAGNOSIS — Z515 Encounter for palliative care: Secondary | ICD-10-CM | POA: Diagnosis not present

## 2023-09-25 MED ORDER — GLYCOPYRROLATE 0.2 MG/ML IJ SOLN: 0.4000 mg | INTRAMUSCULAR | Status: DC | PRN

## 2023-09-25 MED ORDER — GLYCOPYRROLATE 0.2 MG/ML IJ SOLN
0.4000 mg | Freq: Four times a day (QID) | INTRAMUSCULAR | Status: DC
  Administered 2023-09-25: 0.4 mg via INTRAVENOUS
  Filled 2023-09-25 (×3): qty 2

## 2023-09-25 MED ORDER — SCOPOLAMINE 1 MG/3DAYS TD PT72
1.0000 | MEDICATED_PATCH | TRANSDERMAL | Status: DC
  Administered 2023-09-25: 1.5 mg via TRANSDERMAL
  Filled 2023-09-25: qty 1

## 2023-09-25 MED ORDER — GLYCOPYRROLATE 0.2 MG/ML IJ SOLN
0.4000 mg | Freq: Four times a day (QID) | INTRAMUSCULAR | Status: DC
  Filled 2023-09-25 (×3): qty 2

## 2023-09-25 MED ORDER — BISACODYL 10 MG RE SUPP: 10.0000 mg | Freq: Every day | RECTAL | Status: DC | PRN

## 2023-09-25 MED ORDER — MIDAZOLAM HCL 2 MG/2ML IJ SOLN: 1.0000 mg | INTRAMUSCULAR | Status: DC | PRN

## 2023-09-25 MED ORDER — GLYCOPYRROLATE 0.2 MG/ML IJ SOLN
0.4000 mg | Freq: Three times a day (TID) | INTRAMUSCULAR | Status: DC
  Filled 2023-09-25: qty 2

## 2023-09-25 NOTE — Plan of Care (Signed)
  Problem: Education: Goal: Knowledge of General Education information will improve Description: Including pain rating scale, medication(s)/side effects and non-pharmacologic comfort measures Outcome: Adequate for Discharge   Problem: Health Behavior/Discharge Planning: Goal: Ability to manage health-related needs will improve Outcome: Adequate for Discharge   Problem: Clinical Measurements: Goal: Ability to maintain clinical measurements within normal limits will improve Outcome: Adequate for Discharge Goal: Will remain free from infection Outcome: Adequate for Discharge Goal: Diagnostic test results will improve Outcome: Adequate for Discharge Goal: Respiratory complications will improve Outcome: Adequate for Discharge Goal: Cardiovascular complication will be avoided Outcome: Adequate for Discharge   Problem: Activity: Goal: Risk for activity intolerance will decrease Outcome: Adequate for Discharge   Problem: Nutrition: Goal: Adequate nutrition will be maintained Outcome: Adequate for Discharge   Problem: Coping: Goal: Level of anxiety will decrease Outcome: Adequate for Discharge   Problem: Elimination: Goal: Will not experience complications related to bowel motility Outcome: Adequate for Discharge Goal: Will not experience complications related to urinary retention Outcome: Adequate for Discharge   Problem: Pain Managment: Goal: General experience of comfort will improve and/or be controlled Outcome: Adequate for Discharge   Problem: Safety: Goal: Ability to remain free from injury will improve Outcome: Adequate for Discharge   Problem: Skin Integrity: Goal: Risk for impaired skin integrity will decrease Outcome: Adequate for Discharge   Problem: Education: Goal: Ability to describe self-care measures that may prevent or decrease complications (Diabetes Survival Skills Education) will improve Outcome: Adequate for Discharge Goal: Individualized Educational  Video(s) Outcome: Adequate for Discharge   Problem: Coping: Goal: Ability to adjust to condition or change in health will improve Outcome: Adequate for Discharge   Problem: Fluid Volume: Goal: Ability to maintain a balanced intake and output will improve Outcome: Adequate for Discharge   Problem: Health Behavior/Discharge Planning: Goal: Ability to identify and utilize available resources and services will improve Outcome: Adequate for Discharge Goal: Ability to manage health-related needs will improve Outcome: Adequate for Discharge   Problem: Metabolic: Goal: Ability to maintain appropriate glucose levels will improve Outcome: Adequate for Discharge   Problem: Nutritional: Goal: Maintenance of adequate nutrition will improve Outcome: Adequate for Discharge Goal: Progress toward achieving an optimal weight will improve Outcome: Adequate for Discharge   Problem: Skin Integrity: Goal: Risk for impaired skin integrity will decrease Outcome: Adequate for Discharge   Problem: Tissue Perfusion: Goal: Adequacy of tissue perfusion will improve Outcome: Adequate for Discharge   Problem: Education: Goal: Knowledge of the prescribed therapeutic regimen will improve Outcome: Adequate for Discharge   Problem: Coping: Goal: Ability to identify and develop effective coping behavior will improve Outcome: Adequate for Discharge   Problem: Clinical Measurements: Goal: Quality of life will improve Outcome: Adequate for Discharge   Problem: Respiratory: Goal: Verbalizations of increased ease of respirations will increase Outcome: Adequate for Discharge   Problem: Role Relationship: Goal: Family's ability to cope with current situation will improve Outcome: Adequate for Discharge Goal: Ability to verbalize concerns, feelings, and thoughts to partner or family member will improve Outcome: Adequate for Discharge   Problem: Pain Management: Goal: Satisfaction with pain management  regimen will improve Outcome: Adequate for Discharge

## 2023-09-25 NOTE — Discharge Summary (Signed)
 Physician Discharge Summary   [name] MRN: [id number] DOB: 1933/03/19  Admit date:     09/15/2023  Discharge date: 09/25/23  Discharge Physician: Yetta Blanch  PCP: Corlis Pagan, NP  Recommendations at discharge:  Establish care with hospice.    Follow-up Information     Emelia Josefa HERO, NP. Go on 10/09/2023.   Specialty: Cardiology Why: Please go to cardiology follow up appt with Josefa Emelia, NP on 10/09/23 at 1:55 PM Contact information: [address] [place] [address] [phone]         [name], MD. Go on 12/05/2023.   Specialty: Cardiology Why: Please go to follow up cardiology visit with Dr. Mona on 12/05/23 at 9:20 AM Contact information: [address] [place] [address] [phone]                Discharge Diagnoses: Principal Problem:   Acute encephalopathy Active Problems:   Essential hypertension   DM2 (diabetes mellitus, type 2) (HCC)   ESRD on peritoneal dialysis (HCC)   Hx of CABG   Dyslipidemia   PAD (peripheral artery disease) (HCC)   Hypertension   Coronary artery disease   Congestive heart failure (HCC)   DNR (do not resuscitate)   AKI (acute kidney injury) (HCC)   Nonrheumatic aortic valve stenosis  Hospital Course: Patient with PMH of HTN, ESRD on PD, dementia, CAD SP CABG, moderate aortic stenosis, chronic HFpEF presented to the hospital with complaints of confusion as well as poor p.o. intake and nausea and vomiting. Was being transition to HD. Now comfort care.  Assessment and Plan: Acute metabolic encephalopathy in the setting of uremia. Baseline dementia. CT head negative for any acute abnormality. Code stroke was called on 7/22 morning.  Although later on canceled.  Mentation initially waxing and waning and now progressively worsening. Now comfort care.  ESRD Was on PD.  Nephrology was consulted. There is concern for PD catheter malfunctioning with poor drainage. Nephrology  discussed with patient's primary nephrologist and plan was to switch to IHD. [facility] was consulted for PD removal although they feel that the patient is too high risk for any procedure and therefore would not recommend PD removal unless there is any other significant indication. Unable to tolerate HD with bradycardic events in the setting of CAD and severe aortic stenosis. Now comfort care.  CAD with prior history of CABG. Non-STEMI-demand ischemia Chronic systolic and diastolic CHF. PAD Low-flow low gradient severe aortic stenosis. EF 40 to 45%. Per cardiology, cath in December 2024, no good option for revascularization for CAD and recommended medical management based on prior workup. Graft to OM is closed and known proximal LCx occlusion. Troponin peaked at 7244.  Trending down GDMT limited secondary to ESRD and hypotension. Also has severe aortic stenosis with low flow. Not a candidate for redo sternotomy and most likely at a high risk for TAVR. Medical management recommended. Outpatient follow-up with Dr. Hilty/cardiology. Now comfort care.  Nausea and diarrhea. Currently resolved.  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  Uncontrolled with hyper and hypoglycemia. Now comfort care.  Left shoulder pain. X-ray shows arthritis. Continue pain control.  Goals of care conversation. Given that the patient has severe aortic stenosis with poor candidacy for any surgery, patient's prognosis is very poor in the setting of need for hemodialysis and poor p.o. intake. Hypotension episode can be life-threatening for the patient. Also mentation has not improved back to his baseline and whether this is delirium or  undiagnosed dementia, or uremia prognosis remains poor. Palliative care is consulted. Family meeting on 7/25.  Patient's prognosis is very poor in the setting of severe aortic stenosis and inability to tolerate hemodialysis.  Family understand the risk and agrees to  transition to comfort care in the hospital and eventual discharge to residential hospice.  Delirium. Etiology not clear. Likely in the setting of uremia and poor clearance in the setting of PD and therefore currently switched to HD. Now comfort care.  Consultants:  Nephrology  Palliative care  Cardiology  Vascular surgery   Procedures performed:  Echocardiogram  HD  DISCHARGE MEDICATION: Allergies as of 09/25/2023       Reactions   Clonidine  Hcl    Other reaction(s): constipation        Medication List     STOP taking these medications    (feeding supplement) PROSource Plus liquid   aspirin  EC 81 MG tablet   atorvastatin  80 MG tablet Commonly known as: LIPITOR    carvedilol  3.125 MG tablet Commonly known as: COREG    diphenhydramine -acetaminophen  25-500 MG Tabs tablet Commonly known as: TYLENOL  PM   famotidine  10 MG tablet Commonly known as: PEPCID    folic acid  400 MCG tablet Commonly known as: FOLVITE    furosemide  80 MG tablet Commonly known as: LASIX    insulin  glargine 100 UNIT/ML injection Commonly known as: LANTUS    isosorbide  mononitrate 30 MG 24 hr tablet Commonly known as: IMDUR    lactulose  (encephalopathy) 10 GM/15ML Soln Commonly known as: CHRONULAC    linaclotide  145 MCG Caps capsule Commonly known as: Linzess    melatonin 1 MG Tabs tablet   mirtazapine 7.5 MG tablet Commonly known as: REMERON   omeprazole  40 MG capsule Commonly known as: PRILOSEC   Sensipar 60 MG tablet Generic drug: cinacalcet   sevelamer  carbonate 800 MG tablet Commonly known as: RENVELA    zolpidem 10 MG tablet Commonly known as: AMBIEN       Disposition: Residential hospice Diet recommendation: Regular diet  Discharge Exam: Vitals:   09/23/23 1958 09/24/23 0430 09/24/23 1957 09/25/23 0457  BP: (!) 169/73 (!) 151/102 138/69 (!) 111/56  Pulse: 76 81 83 87  Resp: 16 18 16 18   Temp: 97.9 F (36.6 C) 98.2 F (36.8 C) 98.2 F (36.8 C) 99 F (37.2 C)   TempSrc: Oral Oral Oral Oral  SpO2: 93% 99% (!) 89% (!) 85%  Weight:      Height:       Upper airway crackles.  No wheezing.  Lethargic.   Filed Weights   09/20/23 1556 09/20/23 1558 09/21/23 0708  Weight: 63.5 kg 63.5 kg 63.5 kg   Condition at discharge: stable  The results of significant diagnostics from this hospitalization (including imaging, microbiology, ancillary and laboratory) are listed below for reference.   Imaging Studies: DG Shoulder Left Port Result Date: 09/19/2023 CLINICAL DATA:  Arthritis.  Shoulder pain EXAM: LEFT SHOULDER COMPARISON:  None Available. FINDINGS: Glenohumeral joint is intact. No evidence of scapular fracture or humeral fracture. The acromioclavicular joint is intact. Degenerate spurring of the glenoid fossa. IMPRESSION: 1. No acute findings. 2. Mild glenohumeral osteoarthritis. Electronically Signed   By: Jackquline Boxer M.D.   On: 09/19/2023 16:19   ECHOCARDIOGRAM COMPLETE Result Date: 09/16/2023    ECHOCARDIOGRAM REPORT   Patient Name:   RIGBY LEONHARDT Date of Exam: 09/16/2023 Medical Rec #:  [id number]       Height:       71.0 in Accession #:    [id number]  Weight:       158.1 lb Date of Birth:  Aug 07, 1933        BSA:          1.908 m Patient Age:    89 years        BP:           138/63 mmHg Patient Gender: M               HR:           74 bpm. Exam Location:  Inpatient Procedure: 2D Echo, 3D Echo, Cardiac Doppler and Color Doppler (Both Spectral            and Color Flow Doppler were utilized during procedure). Indications:    Elevated Troponin  History:        Patient has prior history of Echocardiogram examinations, most                 recent 10/06/2022. CHF, CAD, Prior CABG, PAD; Risk                 Factors:Hypertension, Diabetes, Dyslipidemia and Former Smoker.  Sonographer:    Koleen Popper RDCS Referring Phys: [id number] SUBRINA SUNDIL IMPRESSIONS  1. Left ventricular ejection fraction, by estimation, is 40 to 45%. The left ventricle has mildly  decreased function. The left ventricle demonstrates regional wall motion abnormalities (see scoring diagram/findings for description). Left ventricular diastolic parameters are consistent with Grade II diastolic dysfunction (pseudonormalization). Elevated left atrial pressure. There is severe hypokinesis of the left ventricular, entire inferolateral wall. There is mild hypokinesis of the left ventricular, basal-mid lateral wall and inferior wall.  2. Right ventricular systolic function is mildly reduced. The right ventricular size is normal. There is normal pulmonary artery systolic pressure. The estimated right ventricular systolic pressure is 20.1 mmHg.  3. Left atrial size was moderately dilated.  4. The mitral valve is degenerative. Mild to moderate mitral valve regurgitation. No evidence of mitral stenosis. Moderate mitral annular calcification.  5. The aortic valve is tricuspid. There is severe calcifcation of the aortic valve. There is severe thickening of the aortic valve. Aortic valve regurgitation is trivial. Severe aortic valve stenosis. Aortic valve mean gradient measures 31.0 mmHg. Aortic valve Vmax measures 3.68 m/s. Comparison(s): The left ventricular function is worsened. The left ventricular wall motion abnormalities are new. Aortic stenosis is worse and meets criteria for low flow low gradient severe aortic stenosis. FINDINGS  Left Ventricle: Left ventricular ejection fraction, by estimation, is 40 to 45%. The left ventricle has mildly decreased function. The left ventricle demonstrates regional wall motion abnormalities. Severe hypokinesis of the left ventricular, entire inferolateral wall. Mild hypokinesis of the left ventricular, basal-mid lateral wall and inferior wall. 3D ejection fraction reviewed and evaluated as part of the interpretation. Alternate measurement of EF is felt to be most reflective of LV function. The left ventricular internal cavity size was normal in size. There is no left  ventricular hypertrophy. Abnormal (paradoxical) septal motion consistent with post-operative status. Left ventricular diastolic parameters are consistent with Grade II diastolic dysfunction (pseudonormalization). Elevated left atrial pressure.  LV Wall Scoring: The antero-lateral wall, posterior wall, and basal inferior segment are hypokinetic. The entire anterior wall, entire septum, entire apex, and mid and distal inferior wall are normal. Right Ventricle: The right ventricular size is normal. No increase in right ventricular wall thickness. Right ventricular systolic function is mildly reduced. There is normal pulmonary artery systolic pressure. The tricuspid regurgitant velocity is 2.07 m/s,  and with an assumed right atrial pressure of 3 mmHg, the estimated right ventricular systolic pressure is 20.1 mmHg. Left Atrium: Left atrial size was moderately dilated. Right Atrium: Right atrial size was normal in size. Pericardium: There is no evidence of pericardial effusion. Mitral Valve: The mitral valve is degenerative in appearance. Moderate mitral annular calcification. Mild to moderate mitral valve regurgitation, with centrally-directed jet. No evidence of mitral valve stenosis. Tricuspid Valve: The tricuspid valve is normal in structure. Tricuspid valve regurgitation is trivial. Aortic Valve: The aortic valve is tricuspid. There is severe calcifcation of the aortic valve. There is severe thickening of the aortic valve. Aortic valve regurgitation is trivial. Severe aortic stenosis is present. Aortic valve mean gradient measures 31.0 mmHg. Aortic valve peak gradient measures 54.0 mmHg. Aortic valve area, by VTI measures 0.73 cm. Pulmonic Valve: The pulmonic valve was not well visualized. Pulmonic valve regurgitation is not visualized. No evidence of pulmonic stenosis. Aorta: The aortic root is normal in size and structure. IAS/Shunts: No atrial level shunt detected by color flow Doppler. Additional Comments: 3D  was performed not requiring image post processing on an independent workstation and was abnormal.  LEFT VENTRICLE PLAX 2D LVIDd:         4.70 cm   Diastology LVIDs:         4.40 cm   LV e' medial:    3.65 cm/s LV PW:         1.10 cm   LV E/e' medial:  27.9 LV IVS:        1.10 cm   LV e' lateral:   6.00 cm/s LVOT diam:     1.94 cm   LV E/e' lateral: 17.0 LV SV:         57 LV SV Index:   30 LVOT Area:     2.96 cm                           3D Volume EF:                          3D EF:        32 %                          LV EDV:       136 ml                          LV ESV:       93 ml                          LV SV:        43 ml RIGHT VENTRICLE            IVC RV Basal diam:  3.80 cm    IVC diam: 1.30 cm RV S prime:     8.60 cm/s TAPSE (M-mode): 2.0 cm LEFT ATRIUM             Index        RIGHT ATRIUM           Index LA diam:        4.80 cm 2.52 cm/m   RA Area:     12.70 cm LA Vol (A2C):   50.8 ml 26.63 ml/m  RA Volume:  30.10 ml  15.78 ml/m LA Vol (A4C):   54.9 ml 28.77 ml/m LA Biplane Vol: 55.1 ml 28.88 ml/m  AORTIC VALVE AV Area (Vmax):    0.72 cm AV Area (Vmean):   0.66 cm AV Area (VTI):     0.73 cm AV Vmax:           367.50 cm/s AV Vmean:          260.500 cm/s AV VTI:            0.783 m AV Peak Grad:      54.0 mmHg AV Mean Grad:      31.0 mmHg LVOT Vmax:         89.10 cm/s LVOT Vmean:        58.200 cm/s LVOT VTI:          0.194 m LVOT/AV VTI ratio: 0.25  AORTA Ao Root diam: 3.40 cm Ao Asc diam:  2.90 cm MITRAL VALVE                TRICUSPID VALVE MV Area (PHT): 3.31 cm     TR Peak grad:   17.1 mmHg MV Decel Time: 229 msec     TR Vmax:        207.00 cm/s MV E velocity: 102.00 cm/s MV A velocity: 134.00 cm/s  SHUNTS MV E/A ratio:  0.76         Systemic VTI:  0.19 m                             Systemic Diam: 1.94 cm Jerel Croitoru MD Electronically signed by Jerel Balding MD Signature Date/Time: 09/16/2023/2:05:05 PM    Final    DG Abd 1 View Result Date: 09/16/2023 CLINICAL DATA:  Peritoneal dialysis  catheter dysfunction EXAM: ABDOMEN - 1 VIEW COMPARISON:  06/24/2018 FINDINGS: Peritoneal dialysis catheter is unchanged, coiled over the mid pelvis. Normal abdominal gas pattern. No organomegaly. Advanced vascular calcifications. Brachytherapy seeds overlie the prostate gland. Advanced degenerative changes noted within the lumbar spine. IMPRESSION: 1. Peritoneal dialysis catheter coiled over the mid pelvis. Electronically Signed   By: Dorethia Molt M.D.   On: 09/16/2023 10:46   CT HEAD WO CONTRAST ( ) Result Date: 09/15/2023 CLINICAL DATA:  89 year old male with altered mental status and decreased appetite for 4 days. Peritoneal dialysis patient. EXAM: CT HEAD WITHOUT CONTRAST TECHNIQUE: Contiguous axial images were obtained from the base of the skull through the vertex without intravenous contrast. RADIATION DOSE REDUCTION: This exam was performed according to the departmental dose-optimization program which includes automated exposure control, adjustment of the mA and/or kV according to patient size and/or use of iterative reconstruction technique. COMPARISON:  Head CT 03/07/2023.  Brain MRI 08/27/2021. FINDINGS: Brain: Stable cerebral volume. No midline shift, ventriculomegaly, mass effect, evidence of mass lesion, intracranial hemorrhage or evidence of cortically based acute infarction. Stable gray-white matter differentiation throughout the brain. Patchy and confluent chronic white matter hypodensity. Vascular: Calcified atherosclerosis at the skull base. No suspicious intracranial vascular hyperdensity. Skull: Stable and intact.  No acute osseous abnormality identified. Sinuses/Orbits: Chronic left maxillary sinusitis. Other Visualized paranasal sinuses and mastoids are stable and well aerated. Other: Calcified scalp vessel atherosclerosis diffusely. No acute orbit soft tissue finding. IMPRESSION: 1. No acute intracranial abnormality. 2. Stable non contrast CT appearance of advanced cerebral white matter  disease. 3. Advanced calcified atherosclerosis. Electronically Signed   By: VEAR Hurst M.D.   On: 09/15/2023 12:48  DG Chest Portable 1 View Result Date: 09/15/2023 CLINICAL DATA:  Weakness EXAM: PORTABLE CHEST 1 VIEW COMPARISON:  03/07/2023 FINDINGS: Stable double-lumen right IJ catheter. Status post median sternotomy. Stable cardiopericardial silhouette calcified aorta. Slight prominence of central vasculature. Tiny pleural effusions identified with some mild lung base opacities. No pneumothorax. Overlapping cardiac leads. IMPRESSION: Postop chest with prominent central vasculature.  Right IJ catheter. Tiny pleural effusions with some adjacent opacity, right-greater-than-left. Recommend follow-up. Electronically Signed   By: Ranell Bring M.D.   On: 09/15/2023 12:33    Microbiology: Results for orders placed or performed during the hospital encounter of 09/15/23  Body fluid culture w Gram Stain     Status: None   Collection Time: 09/15/23  3:40 PM   Specimen: Peritoneal Washings; Body Fluid  Result Value Ref Range Status   Specimen Description PERITONEAL  Final   Special Requests NONE  Final   Gram Stain   Final    WBC PRESENT, PREDOMINANTLY MONONUCLEAR NO ORGANISMS SEEN CYTOSPIN SMEAR    Culture   Final    NO GROWTH 3 DAYS Performed at [facility], [address] [place], [place] [address]    Report Status 09/20/2023 FINAL  Final   Labs: CBC: Recent Labs  Lab 09/19/23 0512 09/20/23 0544 09/21/23 0624  WBC 7.1 6.4 6.0  NEUTROABS 5.4  --   --   HGB 9.2* 10.1* 9.8*  HCT 28.5* 32.0* 32.0*  MCV 100.7* 101.3* 105.3*  PLT 164 184 177   Basic Metabolic Panel: Recent Labs  Lab 09/19/23 0512 09/20/23 0544 09/21/23 0624  NA 134* 134* 135  K 3.4* 3.6 3.6  CL 96* 92* 97*  CO2 27 26 26   GLUCOSE 172* 220* 144*  BUN 38* 47* 25*  CREATININE 8.86* 10.02* 6.42*  CALCIUM  8.1* 8.2* 8.4*  MG  --  1.8 1.9  PHOS 5.5* 6.1* 4.5   Liver Function Tests: Recent Labs  Lab  09/19/23 0512 09/20/23 0544 09/21/23 0624  ALBUMIN 1.6* 1.9* 1.9*   CBG: Recent Labs  Lab 09/20/23 1123 09/20/23 1655 09/20/23 2147 09/21/23 0609 09/21/23 1144  GLUCAP 238* 134* 150* 140* 154*    Discharge time spent: greater than 30 minutes.  Author: Yetta Blanch, MD  [facility]

## 2023-09-25 NOTE — TOC Transition Note (Signed)
 Transition of Care [facility]) - Discharge Note   Patient Details  Name: [name] MRN: [id number] Date of Birth: 1933-11-08  Transition of Care [facility]) CM/SW Contact:  Bridget Cordella Simmonds, LCSW Phone Number: 09/25/2023, 3:46 PM   Clinical Narrative:   Pt discharging to [facility].  RN call report to [phone].  Pt has been placed on will call status with [facility].  Once [facility] paperwork has been completed, please call [facility] at [phone] to request that they come and transport pt.      Final next level of care: Hospice Medical Facility Barriers to Discharge: Barriers Resolved   Patient Goals and CMS Choice            Discharge Placement              Patient chooses bed at:  [facility]) Patient to be transferred to facility by: [facility] Name of family member notified: wife [name] Patient and family notified of of transfer: 09/25/23  Discharge Plan and Services Additional resources added to the After Visit Summary for                                       Social Drivers of Health (SDOH) Interventions SDOH Screenings   Food Insecurity: Unknown (09/15/2023)  Housing: Unknown (09/15/2023)  Transportation Needs: Patient Unable To Answer (09/18/2023)  Utilities: Patient Unable To Answer (09/18/2023)  Depression (PHQ2-9): Low Risk  (07/13/2020)  Physical Activity: Sufficiently Active (10/31/2019)  Social Connections: Patient Unable To Answer (09/18/2023)  Stress: No Stress Concern Present (06/29/2023)   Received from [facility]  Tobacco Use: Medium Risk (09/15/2023)     Readmission Risk Interventions    06/27/2022    3:51 PM  Readmission Risk Prevention Plan  Transportation Screening Complete  PCP or Specialist Appt within 5-7 Days Complete  Home Care Screening Complete  Medication Review (RN CM) Referral to Pharmacy

## 2023-09-25 NOTE — Progress Notes (Signed)
 Palliative:  HPI: 89 y.o. male  with past medical history of HTN, ESRD on PD, dementia, CAD s/p CABG, moderate aortic stenosis, chronic HFpEF  admitted on 09/15/2023 after increased confusion, poor oral intake, and nausea/vomiting. Cardiology saw patient. There are no good options for revascularization of occlusion of Lcx. Recommend medial management. Patient has low flow low gradient severe aortic stenosis. He is not a redo sternotomy candidate and likely not a TAVR candidate. Concern for PD catheter malfunctioning with poor drainage. Patient transitioned to HD. Vascular assessed patient and do not recommend PD catheter removal due to patient's comorbidities and high risk for any procedures. Transition to full comfort care beginning 09/21/23.   I have review chart, vital signs, notes, med list, and PRN meds. I met today at [name]'s bedside although no family present. [name] is resting comfortably. Unresponsive. Breathing regular but shallow. Abd soft. He does have audible secretions and increased congestion - I will order scheduled Robinul  and PRN will be available as well. With increased congestion time may be more limited and is certainly a sign of progression at end of life. No other changes made to medications. No distress. Awaiting bed at [facility].   Plan: - DNR - Full comfort care - Adjustments to medications to further ensure comfort - Awaiting bed at [facility]  25 min  Bernarda Kitty, NP Palliative Medicine Team Pager [phone] (Please see [email or web address] for schedule) Team Phone [phone]

## 2023-09-25 NOTE — Progress Notes (Signed)
 Report given to Charlott Ka, Charity fundraiser at [facility]. Ready to receive pt today. Cassie, RN advised to keep IV in. All questions answered to family. Waiting for [facility].

## 2023-09-25 NOTE — TOC Progression Note (Addendum)
 Transition of Care [facility]) - Progression Note    Patient Details  Name: [name] MRN: [id number] Date of Birth: 1933-06-06  [facility]) CM/SW Contact  Bridget Cordella Simmonds, LCSW Phone Number: 09/25/2023, 10:35 AM  Clinical Narrative:   Per Amy/[facility], no bed available today.  Potentially will have bed at [facility] location.  CSW spoke with pt wife [name], updated her on the above.  She is not interested in [place] location and would like to wait for opening at [facility] place.    1500: Message from Amy/[facility]: bed available, they can admit today.  MD notified.    Expected Discharge Plan: Home/Self Care Barriers to Discharge: Continued Medical Work up               Expected Discharge Plan and Services       Living arrangements for the past 2 months: Single Family Home                                       Social Drivers of Health (SDOH) Interventions SDOH Screenings   Food Insecurity: Unknown (09/15/2023)  Housing: Unknown (09/15/2023)  Transportation Needs: Patient Unable To Answer (09/18/2023)  Utilities: Patient Unable To Answer (09/18/2023)  Depression (PHQ2-9): Low Risk  (07/13/2020)  Physical Activity: Sufficiently Active (10/31/2019)  Social Connections: Patient Unable To Answer (09/18/2023)  Stress: No Stress Concern Present (06/29/2023)   Received from [facility]  Tobacco Use: Medium Risk (09/15/2023)    Readmission Risk Interventions    06/27/2022    3:51 PM  Readmission Risk Prevention Plan  Transportation Screening Complete  PCP or Specialist Appt within 5-7 Days Complete  Home Care Screening Complete  Medication Review (RN CM) Referral to Pharmacy

## 2023-09-25 NOTE — Progress Notes (Signed)
 Civil engineer, contracting Hospice hospital liaison note   Received request from [facility] for family interest in [place] [facility]    Chart reviewed and [facility] eligibility is pending at this time.    Unfortunately [facility] does not have a bed to offer today. Offered bed at [facility] in [place] but family wishes to wait for a bed at [facility]. TOC is aware hospital liaison will follow up tomorrow or sooner if room becomes available.    Please do not hesitate to call with any hospice related questions or concerns.    Thank you for the opportunity to participate in this patient's care.    Greig Basket  BSN, [facility] Liaison  [phone]

## 2023-09-26 NOTE — Progress Notes (Signed)
 Late Note Entry- September 26, 2023  Pt d/c to hospice home yesterday. Contacted [facility] this morning to be made aware of pt's d/c disposition.   Randine Mungo Dialysis Navigator  [phone]

## 2023-09-28 DEATH — deceased

## 2023-10-02 ENCOUNTER — Telehealth: Payer: Self-pay | Admitting: Internal Medicine

## 2023-10-02 NOTE — Telephone Encounter (Signed)
 Wife Bobbetta) called to report passed away on October 25, 2023.

## 2023-10-09 ENCOUNTER — Ambulatory Visit: Admitting: General Practice

## 2023-10-10 ENCOUNTER — Ambulatory Visit (HOSPITAL_COMMUNITY)

## 2023-12-05 ENCOUNTER — Ambulatory Visit: Admitting: Internal Medicine
